# Patient Record
Sex: Female | Born: 1960 | Hispanic: No | Smoking: Current some day smoker
Health system: Southern US, Community
[De-identification: ages and names within clinical notes are randomized; demographics above are authoritative.]

## PROBLEM LIST (undated history)

## (undated) ENCOUNTER — Emergency Department (HOSPITAL_COMMUNITY): Admission: EM | Source: Home / Self Care

## (undated) DIAGNOSIS — K760 Fatty (change of) liver, not elsewhere classified: Secondary | ICD-10-CM

## (undated) DIAGNOSIS — T7840XA Allergy, unspecified, initial encounter: Secondary | ICD-10-CM

## (undated) DIAGNOSIS — E7800 Pure hypercholesterolemia, unspecified: Secondary | ICD-10-CM

## (undated) DIAGNOSIS — I2510 Atherosclerotic heart disease of native coronary artery without angina pectoris: Secondary | ICD-10-CM

## (undated) DIAGNOSIS — D649 Anemia, unspecified: Secondary | ICD-10-CM

## (undated) DIAGNOSIS — M2040 Other hammer toe(s) (acquired), unspecified foot: Secondary | ICD-10-CM

## (undated) DIAGNOSIS — D689 Coagulation defect, unspecified: Secondary | ICD-10-CM

## (undated) DIAGNOSIS — K8020 Calculus of gallbladder without cholecystitis without obstruction: Secondary | ICD-10-CM

## (undated) DIAGNOSIS — K219 Gastro-esophageal reflux disease without esophagitis: Secondary | ICD-10-CM

## (undated) DIAGNOSIS — F410 Panic disorder [episodic paroxysmal anxiety] without agoraphobia: Secondary | ICD-10-CM

## (undated) DIAGNOSIS — F32A Depression, unspecified: Secondary | ICD-10-CM

## (undated) DIAGNOSIS — Z972 Presence of dental prosthetic device (complete) (partial): Secondary | ICD-10-CM

## (undated) DIAGNOSIS — N281 Cyst of kidney, acquired: Secondary | ICD-10-CM

## (undated) DIAGNOSIS — Z5189 Encounter for other specified aftercare: Secondary | ICD-10-CM

## (undated) DIAGNOSIS — I10 Essential (primary) hypertension: Secondary | ICD-10-CM

## (undated) DIAGNOSIS — R0602 Shortness of breath: Secondary | ICD-10-CM

## (undated) DIAGNOSIS — J449 Chronic obstructive pulmonary disease, unspecified: Secondary | ICD-10-CM

## (undated) DIAGNOSIS — I252 Old myocardial infarction: Secondary | ICD-10-CM

## (undated) DIAGNOSIS — F319 Bipolar disorder, unspecified: Secondary | ICD-10-CM

## (undated) DIAGNOSIS — M810 Age-related osteoporosis without current pathological fracture: Secondary | ICD-10-CM

## (undated) DIAGNOSIS — H409 Unspecified glaucoma: Secondary | ICD-10-CM

## (undated) DIAGNOSIS — H269 Unspecified cataract: Secondary | ICD-10-CM

## (undated) DIAGNOSIS — F209 Schizophrenia, unspecified: Secondary | ICD-10-CM

## (undated) DIAGNOSIS — J45909 Unspecified asthma, uncomplicated: Secondary | ICD-10-CM

## (undated) DIAGNOSIS — I509 Heart failure, unspecified: Secondary | ICD-10-CM

## (undated) DIAGNOSIS — M1990 Unspecified osteoarthritis, unspecified site: Secondary | ICD-10-CM

## (undated) DIAGNOSIS — Z8489 Family history of other specified conditions: Secondary | ICD-10-CM

## (undated) DIAGNOSIS — I739 Peripheral vascular disease, unspecified: Secondary | ICD-10-CM

## (undated) DIAGNOSIS — I219 Acute myocardial infarction, unspecified: Secondary | ICD-10-CM

## (undated) DIAGNOSIS — G4730 Sleep apnea, unspecified: Secondary | ICD-10-CM

## (undated) DIAGNOSIS — R0600 Dyspnea, unspecified: Secondary | ICD-10-CM

## (undated) DIAGNOSIS — R011 Cardiac murmur, unspecified: Secondary | ICD-10-CM

## (undated) DIAGNOSIS — N393 Stress incontinence (female) (male): Secondary | ICD-10-CM

## (undated) HISTORY — PX: ORIF FOOT FRACTURE: SHX2123

## (undated) HISTORY — PX: UPPER GASTROINTESTINAL ENDOSCOPY: SHX188

## (undated) HISTORY — PX: OTHER SURGICAL HISTORY: SHX169

## (undated) HISTORY — DX: Coagulation defect, unspecified: D68.9

## (undated) HISTORY — PX: COLONOSCOPY: SHX174

## (undated) HISTORY — DX: Schizophrenia, unspecified: F20.9

## (undated) HISTORY — PX: CARDIAC CATHETERIZATION: SHX172

## (undated) HISTORY — DX: Fatty (change of) liver, not elsewhere classified: K76.0

## (undated) HISTORY — DX: Acute myocardial infarction, unspecified: I21.9

## (undated) HISTORY — PX: CLUB FOOT RELEASE: SHX1363

## (undated) HISTORY — DX: Unspecified asthma, uncomplicated: J45.909

## (undated) HISTORY — PX: HAND SURGERY: SHX662

## (undated) HISTORY — DX: Encounter for other specified aftercare: Z51.89

## (undated) HISTORY — DX: Allergy, unspecified, initial encounter: T78.40XA

## (undated) HISTORY — DX: Gastro-esophageal reflux disease without esophagitis: K21.9

## (undated) HISTORY — DX: Calculus of gallbladder without cholecystitis without obstruction: K80.20

## (undated) HISTORY — DX: Age-related osteoporosis without current pathological fracture: M81.0

---

## 2004-05-31 ENCOUNTER — Ambulatory Visit: Payer: Self-pay | Admitting: Internal Medicine

## 2004-07-04 ENCOUNTER — Ambulatory Visit: Payer: Self-pay | Admitting: Internal Medicine

## 2004-07-05 ENCOUNTER — Ambulatory Visit (HOSPITAL_COMMUNITY): Admission: RE | Admit: 2004-07-05 | Discharge: 2004-07-05 | Payer: Self-pay | Admitting: Internal Medicine

## 2004-08-01 ENCOUNTER — Ambulatory Visit: Payer: Self-pay | Admitting: Internal Medicine

## 2004-08-02 ENCOUNTER — Ambulatory Visit (HOSPITAL_COMMUNITY): Admission: RE | Admit: 2004-08-02 | Discharge: 2004-08-02 | Payer: Self-pay | Admitting: Internal Medicine

## 2005-07-28 ENCOUNTER — Inpatient Hospital Stay (HOSPITAL_COMMUNITY): Admission: AD | Admit: 2005-07-28 | Discharge: 2005-07-28 | Payer: Self-pay | Admitting: Obstetrics and Gynecology

## 2005-07-31 ENCOUNTER — Inpatient Hospital Stay (HOSPITAL_COMMUNITY): Admission: AD | Admit: 2005-07-31 | Discharge: 2005-07-31 | Payer: Self-pay | Admitting: Obstetrics & Gynecology

## 2006-05-28 ENCOUNTER — Emergency Department (HOSPITAL_COMMUNITY): Admission: EM | Admit: 2006-05-28 | Discharge: 2006-05-28 | Payer: Self-pay | Admitting: Emergency Medicine

## 2006-09-18 ENCOUNTER — Ambulatory Visit: Payer: Self-pay | Admitting: Internal Medicine

## 2006-09-19 ENCOUNTER — Ambulatory Visit: Payer: Self-pay | Admitting: *Deleted

## 2006-12-08 ENCOUNTER — Inpatient Hospital Stay (HOSPITAL_COMMUNITY): Admission: EM | Admit: 2006-12-08 | Discharge: 2006-12-13 | Payer: Self-pay | Admitting: Emergency Medicine

## 2006-12-17 ENCOUNTER — Inpatient Hospital Stay (HOSPITAL_COMMUNITY): Admission: EM | Admit: 2006-12-17 | Discharge: 2006-12-18 | Payer: Self-pay | Admitting: Emergency Medicine

## 2006-12-25 ENCOUNTER — Ambulatory Visit: Payer: Self-pay | Admitting: Internal Medicine

## 2007-01-01 ENCOUNTER — Ambulatory Visit: Payer: Self-pay | Admitting: Internal Medicine

## 2007-01-22 ENCOUNTER — Ambulatory Visit: Payer: Self-pay | Admitting: Internal Medicine

## 2007-02-14 ENCOUNTER — Encounter (HOSPITAL_COMMUNITY): Admission: RE | Admit: 2007-02-14 | Discharge: 2007-03-21 | Payer: Self-pay | Admitting: Cardiovascular Disease

## 2007-03-05 ENCOUNTER — Ambulatory Visit (HOSPITAL_COMMUNITY): Admission: RE | Admit: 2007-03-05 | Discharge: 2007-03-05 | Payer: Self-pay | Admitting: Cardiovascular Disease

## 2007-03-15 ENCOUNTER — Ambulatory Visit: Payer: Self-pay | Admitting: Family Medicine

## 2007-04-23 ENCOUNTER — Ambulatory Visit: Payer: Self-pay | Admitting: Internal Medicine

## 2007-06-26 ENCOUNTER — Emergency Department (HOSPITAL_COMMUNITY): Admission: EM | Admit: 2007-06-26 | Discharge: 2007-06-26 | Payer: Self-pay | Admitting: Emergency Medicine

## 2007-06-27 ENCOUNTER — Encounter: Payer: Self-pay | Admitting: Obstetrics & Gynecology

## 2007-06-27 ENCOUNTER — Ambulatory Visit: Payer: Self-pay | Admitting: Obstetrics & Gynecology

## 2007-06-27 ENCOUNTER — Other Ambulatory Visit: Admission: RE | Admit: 2007-06-27 | Discharge: 2007-06-27 | Payer: Self-pay | Admitting: Obstetrics & Gynecology

## 2007-06-27 ENCOUNTER — Ambulatory Visit (HOSPITAL_COMMUNITY): Admission: RE | Admit: 2007-06-27 | Discharge: 2007-06-27 | Payer: Self-pay | Admitting: Obstetrics & Gynecology

## 2007-07-04 ENCOUNTER — Ambulatory Visit (HOSPITAL_COMMUNITY): Admission: RE | Admit: 2007-07-04 | Discharge: 2007-07-04 | Payer: Self-pay | Admitting: Family Medicine

## 2007-07-09 ENCOUNTER — Ambulatory Visit: Payer: Self-pay | Admitting: Internal Medicine

## 2007-07-10 ENCOUNTER — Inpatient Hospital Stay (HOSPITAL_COMMUNITY): Admission: RE | Admit: 2007-07-10 | Discharge: 2007-07-21 | Payer: Self-pay | Admitting: Cardiovascular Disease

## 2007-07-11 ENCOUNTER — Encounter (INDEPENDENT_AMBULATORY_CARE_PROVIDER_SITE_OTHER): Payer: Self-pay | Admitting: Cardiovascular Disease

## 2007-07-12 ENCOUNTER — Ambulatory Visit: Payer: Self-pay | Admitting: Cardiothoracic Surgery

## 2007-07-12 ENCOUNTER — Encounter: Payer: Self-pay | Admitting: Cardiothoracic Surgery

## 2007-07-16 HISTORY — PX: FLEXIBLE BRONCHOSCOPY: SHX5094

## 2007-07-16 HISTORY — PX: CORONARY ARTERY BYPASS GRAFT: SHX141

## 2007-07-26 ENCOUNTER — Ambulatory Visit: Payer: Self-pay | Admitting: Internal Medicine

## 2007-08-07 ENCOUNTER — Ambulatory Visit: Payer: Self-pay | Admitting: *Deleted

## 2007-08-09 ENCOUNTER — Encounter: Admission: RE | Admit: 2007-08-09 | Discharge: 2007-08-09 | Payer: Self-pay | Admitting: Cardiothoracic Surgery

## 2007-08-09 ENCOUNTER — Ambulatory Visit: Payer: Self-pay | Admitting: Cardiothoracic Surgery

## 2007-08-23 ENCOUNTER — Encounter: Admission: RE | Admit: 2007-08-23 | Discharge: 2007-08-23 | Payer: Self-pay | Admitting: Cardiothoracic Surgery

## 2007-08-23 ENCOUNTER — Encounter (HOSPITAL_COMMUNITY): Admission: RE | Admit: 2007-08-23 | Discharge: 2007-11-21 | Payer: Self-pay | Admitting: Cardiovascular Disease

## 2007-08-23 ENCOUNTER — Ambulatory Visit: Payer: Self-pay | Admitting: Cardiothoracic Surgery

## 2007-08-26 ENCOUNTER — Ambulatory Visit: Payer: Self-pay | Admitting: Internal Medicine

## 2007-08-26 LAB — CONVERTED CEMR LAB
ALT: 13 units/L (ref 0–35)
AST: 12 units/L (ref 0–37)
Albumin: 3.9 g/dL (ref 3.5–5.2)
Alkaline Phosphatase: 119 units/L — ABNORMAL HIGH (ref 39–117)
BUN: 12 mg/dL (ref 6–23)
Basophils Absolute: 0.1 10*3/uL (ref 0.0–0.1)
Basophils Relative: 1 % (ref 0–1)
CO2: 21 meq/L (ref 19–32)
Calcium: 9.8 mg/dL (ref 8.4–10.5)
Chloride: 105 meq/L (ref 96–112)
Creatinine, Ser: 0.69 mg/dL (ref 0.40–1.20)
Eosinophils Absolute: 0.6 10*3/uL (ref 0.0–0.7)
Eosinophils Relative: 4 % (ref 0–5)
Glucose, Bld: 100 mg/dL — ABNORMAL HIGH (ref 70–99)
HCT: 40.5 % (ref 36.0–46.0)
Hemoglobin: 11.4 g/dL — ABNORMAL LOW (ref 12.0–15.0)
Lithium Lvl: 0.81 meq/L (ref 0.80–1.40)
Lymphocytes Relative: 20 % (ref 12–46)
Lymphs Abs: 2.7 10*3/uL (ref 0.7–4.0)
MCHC: 28.1 g/dL — ABNORMAL LOW (ref 30.0–36.0)
MCV: 97.1 fL (ref 78.0–100.0)
Monocytes Absolute: 1 10*3/uL (ref 0.1–1.0)
Monocytes Relative: 8 % (ref 3–12)
Neutro Abs: 9 10*3/uL — ABNORMAL HIGH (ref 1.7–7.7)
Neutrophils Relative %: 68 % (ref 43–77)
Platelets: 417 10*3/uL — ABNORMAL HIGH (ref 150–400)
Potassium: 4.3 meq/L (ref 3.5–5.3)
RBC: 4.17 M/uL (ref 3.87–5.11)
RDW: 23.8 % — ABNORMAL HIGH (ref 11.5–15.5)
Sodium: 139 meq/L (ref 135–145)
TSH: 3.682 microintl units/mL (ref 0.350–5.50)
Total Bilirubin: 0.2 mg/dL — ABNORMAL LOW (ref 0.3–1.2)
Total Protein: 7 g/dL (ref 6.0–8.3)
WBC: 13.4 10*3/uL — ABNORMAL HIGH (ref 4.0–10.5)

## 2007-08-28 ENCOUNTER — Ambulatory Visit: Payer: Self-pay | Admitting: Obstetrics and Gynecology

## 2007-09-04 ENCOUNTER — Ambulatory Visit: Payer: Self-pay | Admitting: Internal Medicine

## 2007-09-08 ENCOUNTER — Ambulatory Visit (HOSPITAL_COMMUNITY): Admission: RE | Admit: 2007-09-08 | Discharge: 2007-09-08 | Payer: Self-pay | Admitting: Internal Medicine

## 2007-09-19 ENCOUNTER — Ambulatory Visit: Payer: Self-pay | Admitting: Internal Medicine

## 2007-09-19 LAB — CONVERTED CEMR LAB
Free T4: 0.92 ng/dL (ref 0.89–1.80)
TSH: 2.176 microintl units/mL (ref 0.350–5.50)

## 2007-10-11 ENCOUNTER — Ambulatory Visit: Payer: Self-pay | Admitting: Obstetrics & Gynecology

## 2007-10-14 ENCOUNTER — Ambulatory Visit (HOSPITAL_COMMUNITY): Admission: RE | Admit: 2007-10-14 | Discharge: 2007-10-14 | Payer: Self-pay | Admitting: Internal Medicine

## 2007-11-01 ENCOUNTER — Ambulatory Visit: Payer: Self-pay | Admitting: Cardiothoracic Surgery

## 2007-11-22 ENCOUNTER — Ambulatory Visit: Payer: Self-pay | Admitting: Internal Medicine

## 2007-11-27 ENCOUNTER — Ambulatory Visit: Payer: Self-pay | Admitting: Obstetrics and Gynecology

## 2007-12-05 ENCOUNTER — Ambulatory Visit: Payer: Self-pay | Admitting: Internal Medicine

## 2007-12-19 ENCOUNTER — Ambulatory Visit (HOSPITAL_COMMUNITY): Admission: RE | Admit: 2007-12-19 | Discharge: 2007-12-19 | Payer: Self-pay | Admitting: Obstetrics and Gynecology

## 2007-12-19 ENCOUNTER — Ambulatory Visit: Payer: Self-pay | Admitting: Obstetrics and Gynecology

## 2007-12-19 HISTORY — PX: NOVASURE ABLATION: SHX5394

## 2008-01-03 ENCOUNTER — Ambulatory Visit: Payer: Self-pay | Admitting: Obstetrics & Gynecology

## 2008-01-30 ENCOUNTER — Observation Stay (HOSPITAL_COMMUNITY): Admission: RE | Admit: 2008-01-30 | Discharge: 2008-01-31 | Payer: Self-pay | Admitting: Orthopedic Surgery

## 2008-01-30 HISTORY — PX: KNEE ARTHROSCOPY WITH ANTERIOR CRUCIATE LIGAMENT (ACL) REPAIR: SHX5644

## 2008-05-19 ENCOUNTER — Encounter (INDEPENDENT_AMBULATORY_CARE_PROVIDER_SITE_OTHER): Payer: Self-pay | Admitting: Family Medicine

## 2008-05-19 ENCOUNTER — Ambulatory Visit: Payer: Self-pay | Admitting: Internal Medicine

## 2008-05-19 LAB — CONVERTED CEMR LAB
Basophils Absolute: 0.1 10*3/uL (ref 0.0–0.1)
Basophils Relative: 1 % (ref 0–1)
Cholesterol: 211 mg/dL — ABNORMAL HIGH (ref 0–200)
Eosinophils Absolute: 0.3 10*3/uL (ref 0.0–0.7)
Eosinophils Relative: 3 % (ref 0–5)
HCT: 47 % — ABNORMAL HIGH (ref 36.0–46.0)
HDL: 28 mg/dL — ABNORMAL LOW (ref >39)
Hemoglobin: 15.9 g/dL — ABNORMAL HIGH (ref 12.0–15.0)
Lithium Lvl: 0.54 meq/L — ABNORMAL LOW (ref 0.80–1.40)
Lymphocytes Relative: 31 % (ref 12–46)
Lymphs Abs: 3 10*3/uL (ref 0.7–4.0)
MCHC: 33.8 g/dL (ref 30.0–36.0)
MCV: 96.5 fL (ref 78.0–100.0)
Monocytes Absolute: 0.8 10*3/uL (ref 0.1–1.0)
Monocytes Relative: 8 % (ref 3–12)
Neutro Abs: 5.7 10*3/uL (ref 1.7–7.7)
Neutrophils Relative %: 58 % (ref 43–77)
Platelets: 319 10*3/uL (ref 150–400)
RBC: 4.87 M/uL (ref 3.87–5.11)
RDW: 14 % (ref 11.5–15.5)
TSH: 1.352 microintl units/mL (ref 0.350–4.50)
Total CHOL/HDL Ratio: 7.5
Triglycerides: 545 mg/dL — ABNORMAL HIGH (ref <150)
WBC: 9.9 10*3/uL (ref 4.0–10.5)

## 2008-06-24 ENCOUNTER — Ambulatory Visit: Payer: Self-pay | Admitting: Internal Medicine

## 2008-06-24 LAB — CONVERTED CEMR LAB
Cholesterol: 191 mg/dL (ref 0–200)
HDL: 25 mg/dL — ABNORMAL LOW (ref >39)
Total CHOL/HDL Ratio: 7.6
Triglycerides: 779 mg/dL — ABNORMAL HIGH (ref <150)

## 2008-07-28 ENCOUNTER — Emergency Department (HOSPITAL_COMMUNITY): Admission: EM | Admit: 2008-07-28 | Discharge: 2008-07-28 | Payer: Self-pay | Admitting: Emergency Medicine

## 2009-01-07 ENCOUNTER — Ambulatory Visit: Payer: Self-pay | Admitting: Internal Medicine

## 2009-01-21 ENCOUNTER — Ambulatory Visit (HOSPITAL_COMMUNITY): Admission: RE | Admit: 2009-01-21 | Discharge: 2009-01-21 | Payer: Self-pay | Admitting: Internal Medicine

## 2009-03-29 IMAGING — CT CT ANGIO CHEST
3 of 4 series · 18 of 30 positions shown · IV contrast (omnipaque)
Comparison: none

CLINICAL DATA: Chest pain and dyspnea.
CT ANGIOGRAPHY OF CHEST:
TECHNIQUE: Multidetector CT imaging of the chest was performed during bolus injection of intravenous contrast.  Multiplanar CT angiographic image reconstructions were generated to evaluate the vascular anatomy.
Contrast: 100 cc intravenous Omnipaque 300.

[Series 2: pe · axial · 0.70mm/px · z∈[-309,-92]mm · 9 of 223 slices shown]
[im 25/223  lung]
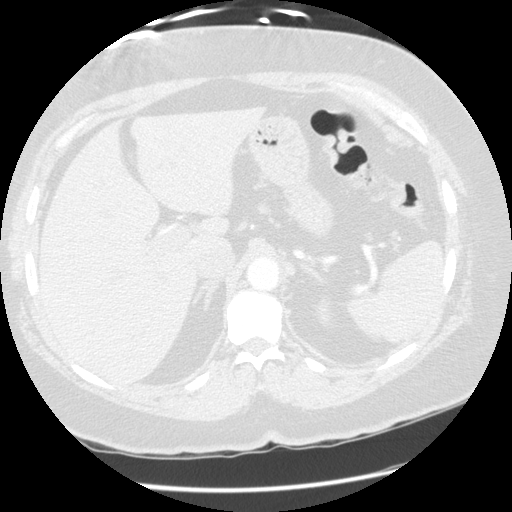
[im 50/223  mediastinal]
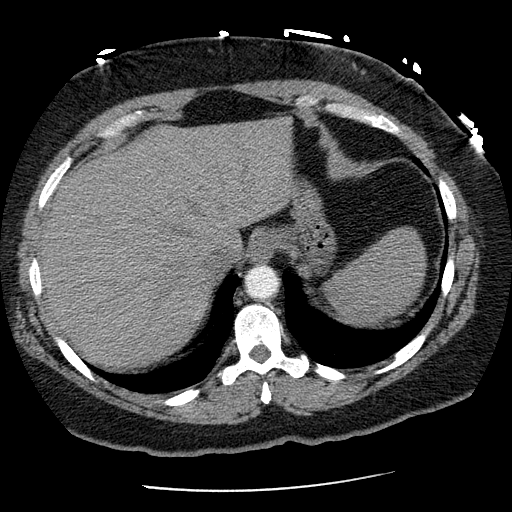
[im 75/223  lung]
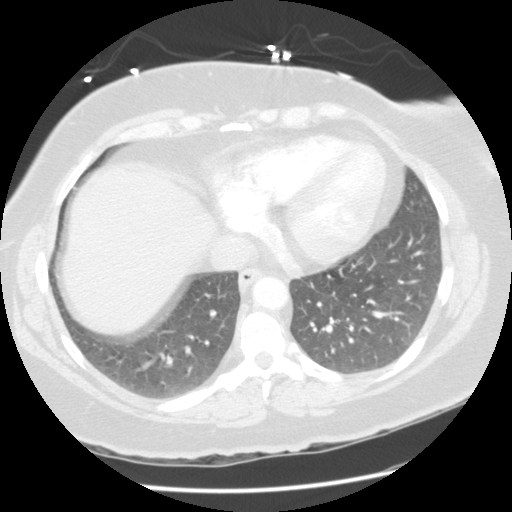
[im 99/223  mediastinal]
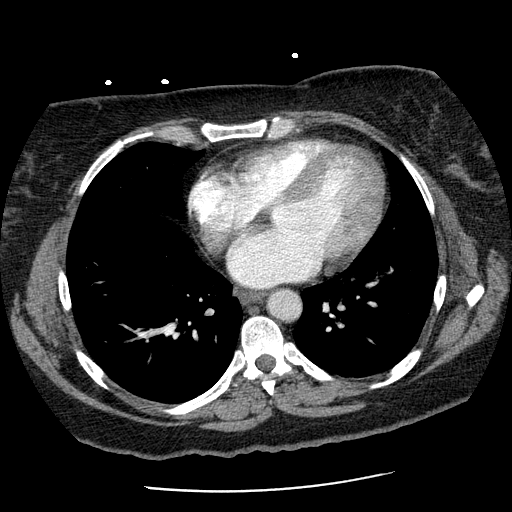
[im 121/223  lung]
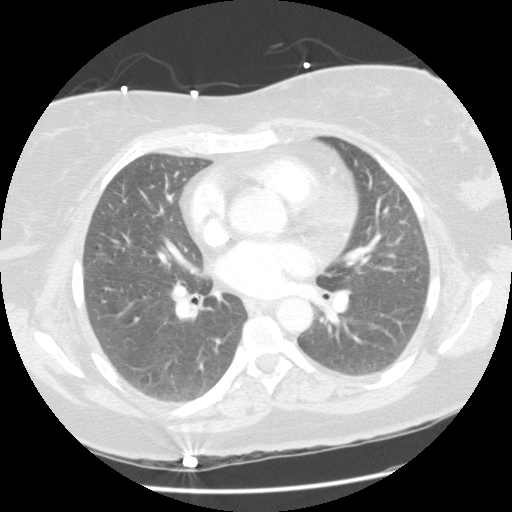
[im 124/223  mediastinal]
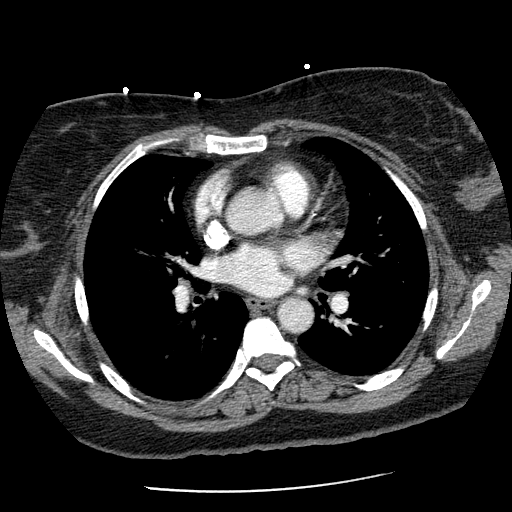
[im 149/223  lung]
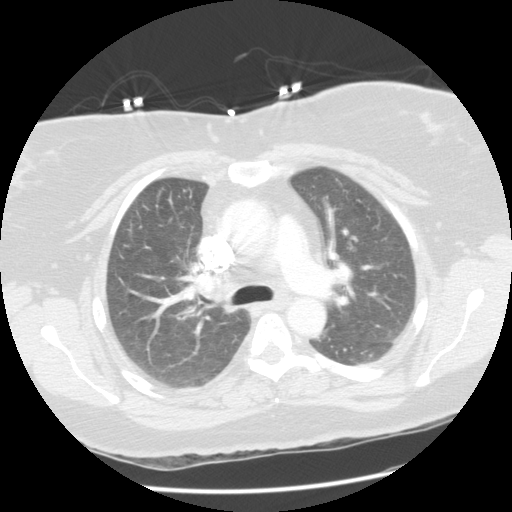
[im 173/223  mediastinal]
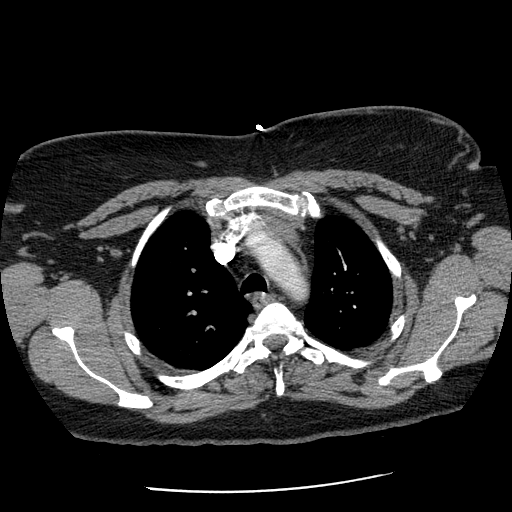
[im 198/223  lung]
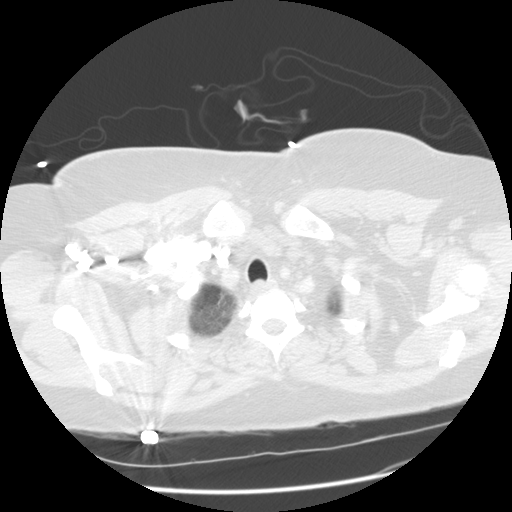

[Series 3: recon 2: pe · axial · 0.70mm/px · z∈[-271,-134]mm · 4 of 111 slices shown]
[im 28/111  lung]
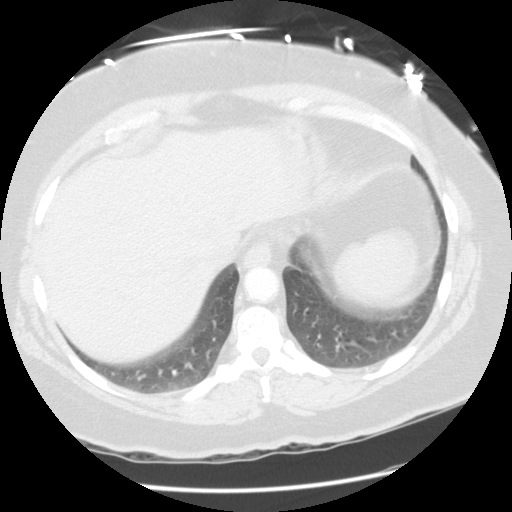
[im 56/111  lung]
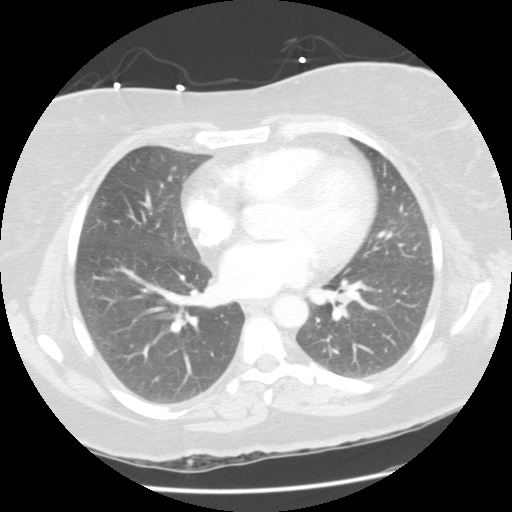
[im 61/111  lung]
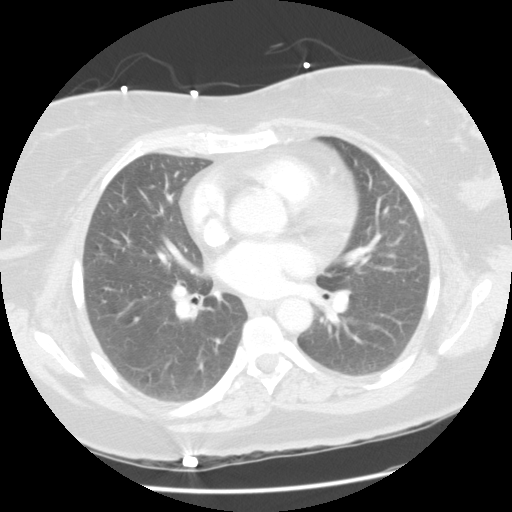
[im 83/111  lung]
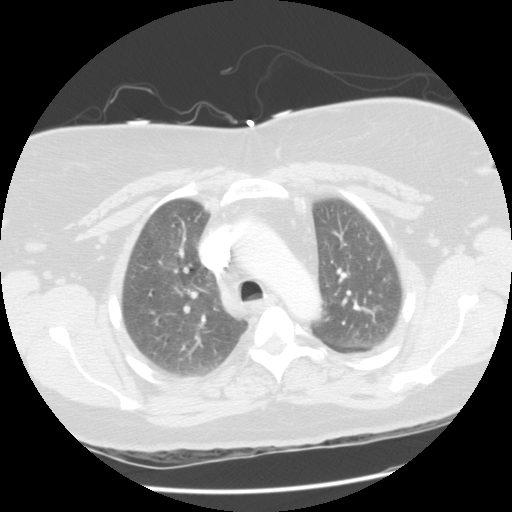

[Series 201: reformatted · sagittal · 0.70mm/px · 5 of 168 slices shown]
[im 28/168  lung]
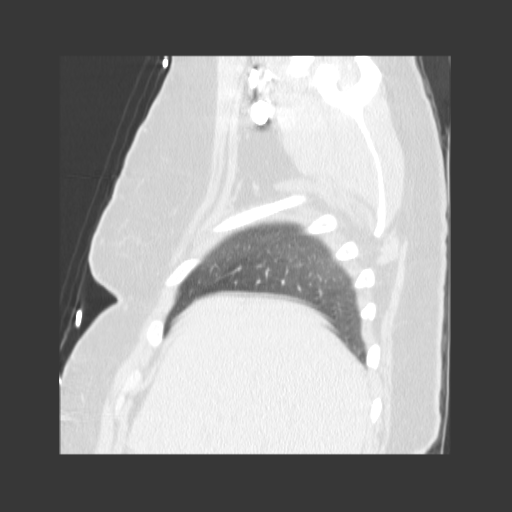
[im 56/168  lung]
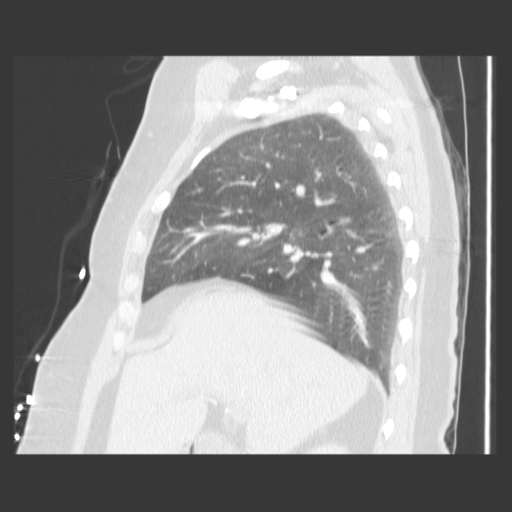
[im 84/168  lung]
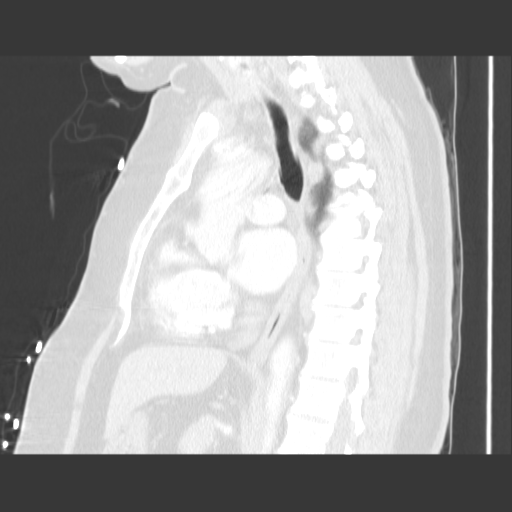
[im 112/168  lung]
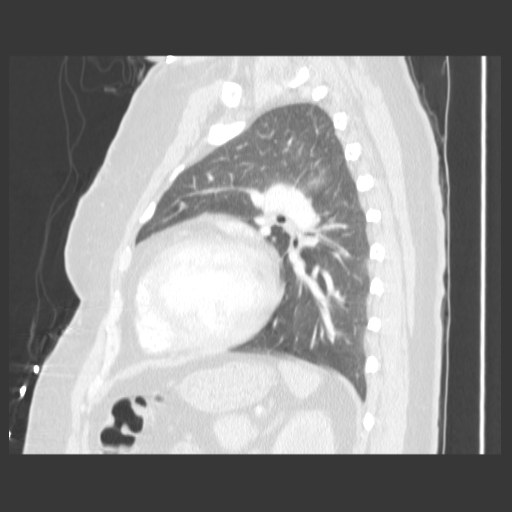
[im 140/168  lung]
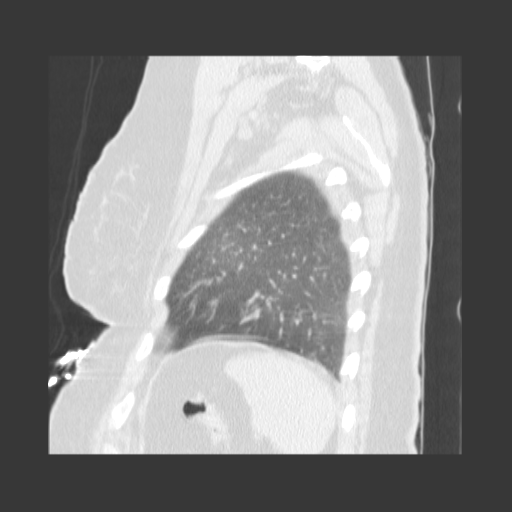

[18 of 30 positions shown; findings below may reference images not displayed]

FINDINGS: There is no evidence of filling defects in the pulmonary arterial system to suggest pulmonary emboli.  Cardiomegaly is identified and there is no evidence of thoracic aortic aneurysm.  There is focal dilatation of the left main pulmonary artery which can be seen with pulmonary valvular stenosis and consider further evaluation as indicated.  No pleural or pericardial effusions or enlarged lymph nodes are identified.  There is no evidence of focal consolidation, masses, or airspace disease.  Mild peribronchial thickening is present.
IMPRESSION: 1.  No acute abnormalities.
2.  Focal enlargement of the left main pulmonary artery ? can be seen with pulmonary valvular stenosis.  Consider echocardiogram as indicated.
3.  Mild cardiomegaly.

## 2009-05-05 ENCOUNTER — Ambulatory Visit: Payer: Self-pay | Admitting: Obstetrics and Gynecology

## 2009-05-05 ENCOUNTER — Encounter: Payer: Self-pay | Admitting: Advanced Practice Midwife

## 2009-05-05 ENCOUNTER — Ambulatory Visit: Payer: Self-pay | Admitting: Internal Medicine

## 2009-05-12 ENCOUNTER — Ambulatory Visit (HOSPITAL_COMMUNITY): Admission: RE | Admit: 2009-05-12 | Discharge: 2009-05-12 | Payer: Self-pay | Admitting: Internal Medicine

## 2009-06-09 ENCOUNTER — Ambulatory Visit: Payer: Self-pay | Admitting: Obstetrics and Gynecology

## 2009-06-10 ENCOUNTER — Encounter: Payer: Self-pay | Admitting: Family

## 2009-06-10 LAB — CONVERTED CEMR LAB
Estradiol: 78.9 pg/mL
FSH: 11.2 milliintl units/mL
HCT: 44.6 % (ref 36.0–46.0)
Hemoglobin: 14.8 g/dL (ref 12.0–15.0)
LH: 4.9 milliintl units/mL
MCHC: 33.2 g/dL (ref 30.0–36.0)
MCV: 100.9 fL — ABNORMAL HIGH (ref 78.0–100.0)
Platelets: 328 10*3/uL (ref 150–400)
RBC: 4.42 M/uL (ref 3.87–5.11)
RDW: 13.7 % (ref 11.5–15.5)
TSH: 2.499 microintl units/mL (ref 0.350–4.500)
WBC: 13.2 10*3/uL — ABNORMAL HIGH (ref 4.0–10.5)

## 2009-06-23 ENCOUNTER — Ambulatory Visit: Payer: Self-pay | Admitting: Obstetrics and Gynecology

## 2009-06-30 ENCOUNTER — Ambulatory Visit: Payer: Self-pay | Admitting: Obstetrics and Gynecology

## 2009-07-06 ENCOUNTER — Ambulatory Visit: Payer: Self-pay | Admitting: Internal Medicine

## 2009-07-16 ENCOUNTER — Inpatient Hospital Stay (HOSPITAL_COMMUNITY): Admission: EM | Admit: 2009-07-16 | Discharge: 2009-07-19 | Payer: Self-pay | Admitting: Emergency Medicine

## 2010-02-10 ENCOUNTER — Ambulatory Visit: Payer: Self-pay | Admitting: Obstetrics and Gynecology

## 2010-02-28 ENCOUNTER — Encounter: Admission: RE | Admit: 2010-02-28 | Discharge: 2010-02-28 | Payer: Self-pay | Admitting: Cardiovascular Disease

## 2010-03-04 ENCOUNTER — Ambulatory Visit (HOSPITAL_COMMUNITY): Admission: RE | Admit: 2010-03-04 | Discharge: 2010-03-04 | Payer: Self-pay | Admitting: Cardiovascular Disease

## 2010-03-04 HISTORY — PX: CARDIAC CATHETERIZATION: SHX172

## 2010-04-28 ENCOUNTER — Ambulatory Visit: Payer: Self-pay | Admitting: Obstetrics and Gynecology

## 2010-09-11 ENCOUNTER — Encounter: Payer: Self-pay | Admitting: *Deleted

## 2010-11-05 LAB — GLUCOSE, CAPILLARY
Glucose-Capillary: 110 mg/dL — ABNORMAL HIGH (ref 70–99)
Glucose-Capillary: 115 mg/dL — ABNORMAL HIGH (ref 70–99)

## 2010-11-23 LAB — CBC
HCT: 42.3 % (ref 36.0–46.0)
HCT: 42.6 % (ref 36.0–46.0)
HCT: 43.5 % (ref 36.0–46.0)
HCT: 43.9 % (ref 36.0–46.0)
HCT: 44.5 % (ref 36.0–46.0)
Hemoglobin: 14.5 g/dL (ref 12.0–15.0)
Hemoglobin: 14.6 g/dL (ref 12.0–15.0)
Hemoglobin: 14.9 g/dL (ref 12.0–15.0)
Hemoglobin: 15 g/dL (ref 12.0–15.0)
Hemoglobin: 15 g/dL (ref 12.0–15.0)
MCHC: 33.8 g/dL (ref 30.0–36.0)
MCHC: 34.2 g/dL (ref 30.0–36.0)
MCHC: 34.2 g/dL (ref 30.0–36.0)
MCHC: 34.3 g/dL (ref 30.0–36.0)
MCHC: 34.4 g/dL (ref 30.0–36.0)
MCV: 96.9 fL (ref 78.0–100.0)
MCV: 97.2 fL (ref 78.0–100.0)
MCV: 97.5 fL (ref 78.0–100.0)
MCV: 97.8 fL (ref 78.0–100.0)
MCV: 97.8 fL (ref 78.0–100.0)
Platelets: 239 10*3/uL (ref 150–400)
Platelets: 240 10*3/uL (ref 150–400)
Platelets: 268 10*3/uL (ref 150–400)
Platelets: 271 10*3/uL (ref 150–400)
Platelets: 274 10*3/uL (ref 150–400)
RBC: 4.36 MIL/uL (ref 3.87–5.11)
RBC: 4.4 MIL/uL (ref 3.87–5.11)
RBC: 4.45 MIL/uL (ref 3.87–5.11)
RBC: 4.49 MIL/uL (ref 3.87–5.11)
RBC: 4.56 MIL/uL (ref 3.87–5.11)
RDW: 13.6 % (ref 11.5–15.5)
RDW: 13.7 % (ref 11.5–15.5)
RDW: 13.9 % (ref 11.5–15.5)
RDW: 14 % (ref 11.5–15.5)
RDW: 14.3 % (ref 11.5–15.5)
WBC: 10.8 10*3/uL — ABNORMAL HIGH (ref 4.0–10.5)
WBC: 11.3 10*3/uL — ABNORMAL HIGH (ref 4.0–10.5)
WBC: 11.5 10*3/uL — ABNORMAL HIGH (ref 4.0–10.5)
WBC: 11.9 10*3/uL — ABNORMAL HIGH (ref 4.0–10.5)
WBC: 9.9 10*3/uL (ref 4.0–10.5)

## 2010-11-23 LAB — COMPREHENSIVE METABOLIC PANEL
ALT: 26 U/L (ref 0–35)
AST: 31 U/L (ref 0–37)
Albumin: 3.9 g/dL (ref 3.5–5.2)
Alkaline Phosphatase: 85 U/L (ref 39–117)
BUN: 9 mg/dL (ref 6–23)
CO2: 21 mEq/L (ref 19–32)
Calcium: 8.8 mg/dL (ref 8.4–10.5)
Chloride: 102 mEq/L (ref 96–112)
Creatinine, Ser: 0.67 mg/dL (ref 0.4–1.2)
GFR calc Af Amer: >60 mL/min (ref >60)
GFR calc non Af Amer: >60 mL/min (ref >60)
Glucose, Bld: 121 mg/dL — ABNORMAL HIGH (ref 70–99)
Potassium: 3.6 mEq/L (ref 3.5–5.1)
Sodium: 134 mEq/L — ABNORMAL LOW (ref 135–145)
Total Bilirubin: 0.6 mg/dL (ref 0.3–1.2)
Total Protein: 6.9 g/dL (ref 6.0–8.3)

## 2010-11-23 LAB — BASIC METABOLIC PANEL
BUN: 10 mg/dL (ref 6–23)
BUN: 12 mg/dL (ref 6–23)
BUN: 9 mg/dL (ref 6–23)
CO2: 24 mEq/L (ref 19–32)
CO2: 26 mEq/L (ref 19–32)
CO2: 26 mEq/L (ref 19–32)
Calcium: 8.9 mg/dL (ref 8.4–10.5)
Calcium: 9.6 mg/dL (ref 8.4–10.5)
Calcium: 9.7 mg/dL (ref 8.4–10.5)
Chloride: 106 mEq/L (ref 96–112)
Chloride: 108 mEq/L (ref 96–112)
Chloride: 109 mEq/L (ref 96–112)
Creatinine, Ser: 0.79 mg/dL (ref 0.4–1.2)
Creatinine, Ser: 0.79 mg/dL (ref 0.4–1.2)
Creatinine, Ser: 0.82 mg/dL (ref 0.4–1.2)
GFR calc Af Amer: >60 mL/min (ref >60)
GFR calc Af Amer: >60 mL/min (ref >60)
GFR calc Af Amer: >60 mL/min (ref >60)
GFR calc non Af Amer: >60 mL/min (ref >60)
GFR calc non Af Amer: >60 mL/min (ref >60)
GFR calc non Af Amer: >60 mL/min (ref >60)
Glucose, Bld: 101 mg/dL — ABNORMAL HIGH (ref 70–99)
Glucose, Bld: 111 mg/dL — ABNORMAL HIGH (ref 70–99)
Glucose, Bld: 111 mg/dL — ABNORMAL HIGH (ref 70–99)
Potassium: 3.9 mEq/L (ref 3.5–5.1)
Potassium: 4 mEq/L (ref 3.5–5.1)
Potassium: 4.1 mEq/L (ref 3.5–5.1)
Sodium: 138 mEq/L (ref 135–145)
Sodium: 140 mEq/L (ref 135–145)
Sodium: 140 mEq/L (ref 135–145)

## 2010-11-23 LAB — PROTIME-INR
INR: 1.14 (ref 0.00–1.49)
Prothrombin Time: 14.5 seconds (ref 11.6–15.2)

## 2010-11-23 LAB — GLUCOSE, CAPILLARY
Glucose-Capillary: 104 mg/dL — ABNORMAL HIGH (ref 70–99)
Glucose-Capillary: 109 mg/dL — ABNORMAL HIGH (ref 70–99)
Glucose-Capillary: 110 mg/dL — ABNORMAL HIGH (ref 70–99)
Glucose-Capillary: 111 mg/dL — ABNORMAL HIGH (ref 70–99)
Glucose-Capillary: 112 mg/dL — ABNORMAL HIGH (ref 70–99)
Glucose-Capillary: 116 mg/dL — ABNORMAL HIGH (ref 70–99)
Glucose-Capillary: 118 mg/dL — ABNORMAL HIGH (ref 70–99)
Glucose-Capillary: 120 mg/dL — ABNORMAL HIGH (ref 70–99)
Glucose-Capillary: 128 mg/dL — ABNORMAL HIGH (ref 70–99)
Glucose-Capillary: 128 mg/dL — ABNORMAL HIGH (ref 70–99)
Glucose-Capillary: 87 mg/dL (ref 70–99)

## 2010-11-23 LAB — BRAIN NATRIURETIC PEPTIDE
Pro B Natriuretic peptide (BNP): 46 pg/mL (ref 0.0–100.0)
Pro B Natriuretic peptide (BNP): 62 pg/mL (ref 0.0–100.0)

## 2010-11-23 LAB — CARDIAC PANEL(CRET KIN+CKTOT+MB+TROPI)
CK, MB: 15 ng/mL — ABNORMAL HIGH (ref 0.3–4.0)
CK, MB: 17.8 ng/mL — ABNORMAL HIGH (ref 0.3–4.0)
CK, MB: 20.3 ng/mL — ABNORMAL HIGH (ref 0.3–4.0)
Relative Index: 10.1 — ABNORMAL HIGH (ref 0.0–2.5)
Relative Index: 7.3 — ABNORMAL HIGH (ref 0.0–2.5)
Relative Index: 9.1 — ABNORMAL HIGH (ref 0.0–2.5)
Total CK: 177 U/L (ref 7–177)
Total CK: 205 U/L — ABNORMAL HIGH (ref 7–177)
Total CK: 224 U/L — ABNORMAL HIGH (ref 7–177)
Troponin I: 1.04 ng/mL (ref 0.00–0.06)
Troponin I: 2.4 ng/mL (ref 0.00–0.06)
Troponin I: 2.64 ng/mL (ref 0.00–0.06)

## 2010-11-23 LAB — LIPID PANEL
Cholesterol: 193 mg/dL (ref 0–200)
HDL: 34 mg/dL — ABNORMAL LOW (ref >39)
LDL Cholesterol: 95 mg/dL (ref 0–99)
Total CHOL/HDL Ratio: 5.7 RATIO
Triglycerides: 318 mg/dL — ABNORMAL HIGH (ref <150)
VLDL: 64 mg/dL — ABNORMAL HIGH (ref 0–40)

## 2010-11-23 LAB — DIFFERENTIAL
Basophils Absolute: 0.1 10*3/uL (ref 0.0–0.1)
Basophils Relative: 1 % (ref 0–1)
Eosinophils Absolute: 0.2 10*3/uL (ref 0.0–0.7)
Eosinophils Relative: 2 % (ref 0–5)
Lymphocytes Relative: 30 % (ref 12–46)
Lymphs Abs: 3.4 10*3/uL (ref 0.7–4.0)
Monocytes Absolute: 0.9 10*3/uL (ref 0.1–1.0)
Monocytes Relative: 8 % (ref 3–12)
Neutro Abs: 7 10*3/uL (ref 1.7–7.7)
Neutrophils Relative %: 61 % (ref 43–77)

## 2010-11-23 LAB — CK TOTAL AND CKMB (NOT AT ARMC)
CK, MB: 1.7 ng/mL (ref 0.3–4.0)
CK, MB: 2.6 ng/mL (ref 0.3–4.0)
CK, MB: 4.6 ng/mL — ABNORMAL HIGH (ref 0.3–4.0)
CK, MB: 7.8 ng/mL — ABNORMAL HIGH (ref 0.3–4.0)
Relative Index: 3.4 — ABNORMAL HIGH (ref 0.0–2.5)
Relative Index: 5.5 — ABNORMAL HIGH (ref 0.0–2.5)
Relative Index: INVALID (ref 0.0–2.5)
Relative Index: INVALID (ref 0.0–2.5)
Total CK: 135 U/L (ref 7–177)
Total CK: 142 U/L (ref 7–177)
Total CK: 91 U/L (ref 7–177)
Total CK: 93 U/L (ref 7–177)

## 2010-11-23 LAB — APTT
aPTT: 64 seconds — ABNORMAL HIGH (ref 24–37)
aPTT: >200 seconds (ref 24–37)

## 2010-11-23 LAB — TROPONIN I
Troponin I: 0.03 ng/mL (ref 0.00–0.06)
Troponin I: 0.07 ng/mL — ABNORMAL HIGH (ref 0.00–0.06)
Troponin I: 1.56 ng/mL (ref 0.00–0.06)
Troponin I: 1.96 ng/mL (ref 0.00–0.06)

## 2010-11-23 LAB — HEPARIN LEVEL (UNFRACTIONATED)
Heparin Unfractionated: 0.19 IU/mL — ABNORMAL LOW (ref 0.30–0.70)
Heparin Unfractionated: 0.35 IU/mL (ref 0.30–0.70)
Heparin Unfractionated: 0.36 IU/mL (ref 0.30–0.70)
Heparin Unfractionated: 0.46 IU/mL (ref 0.30–0.70)
Heparin Unfractionated: 0.59 IU/mL (ref 0.30–0.70)

## 2010-11-23 LAB — TSH: TSH: 1.792 u[IU]/mL (ref 0.350–4.500)

## 2010-11-23 LAB — HEMOGLOBIN A1C
Hgb A1c MFr Bld: 6 % (ref 4.6–6.1)
Mean Plasma Glucose: 126 mg/dL

## 2010-11-23 LAB — MAGNESIUM: Magnesium: 2.3 mg/dL (ref 1.5–2.5)

## 2010-11-29 ENCOUNTER — Emergency Department (HOSPITAL_COMMUNITY)
Admission: EM | Admit: 2010-11-29 | Discharge: 2010-11-29 | Disposition: A | Discharge status: Home / Self Care | Payer: Medicare Other | Attending: Emergency Medicine | Admitting: Emergency Medicine

## 2010-11-29 DIAGNOSIS — I209 Angina pectoris, unspecified: Secondary | ICD-10-CM | POA: Insufficient documentation

## 2010-11-29 DIAGNOSIS — F319 Bipolar disorder, unspecified: Secondary | ICD-10-CM | POA: Insufficient documentation

## 2010-11-29 DIAGNOSIS — M542 Cervicalgia: Secondary | ICD-10-CM | POA: Insufficient documentation

## 2010-11-29 DIAGNOSIS — E785 Hyperlipidemia, unspecified: Secondary | ICD-10-CM | POA: Insufficient documentation

## 2010-11-29 DIAGNOSIS — I252 Old myocardial infarction: Secondary | ICD-10-CM | POA: Insufficient documentation

## 2010-11-29 DIAGNOSIS — R209 Unspecified disturbances of skin sensation: Secondary | ICD-10-CM | POA: Insufficient documentation

## 2010-11-29 DIAGNOSIS — I10 Essential (primary) hypertension: Secondary | ICD-10-CM | POA: Insufficient documentation

## 2010-11-29 DIAGNOSIS — R079 Chest pain, unspecified: Secondary | ICD-10-CM | POA: Insufficient documentation

## 2010-11-29 DIAGNOSIS — E119 Type 2 diabetes mellitus without complications: Secondary | ICD-10-CM | POA: Insufficient documentation

## 2011-01-03 NOTE — Group Therapy Note (Signed)
NAME:  [name], [name] NO.:  [id number]   MEDICAL RECORD NO.:  [id number]          PATIENT TYPE:  WOC   LOCATION:  [facility]                   FACILITY:  [facility]   PHYSICIAN:  Argentina Donovan, MD        DATE OF BIRTH:  1961/03/19   DATE OF SERVICE:                                  CLINIC NOTE   Patient is a 50 year old Caucasian female whose is bothered by constant  menorrhagia.  She has a Mirena IUD in order to control it.  Her health  has been compromised by coronary artery bypass grafting x3, heart  catheterization x2.  She has a history of a C-section, bilateral tubal  ligation.   PAST MEDICAL HISTORY:  1. Colitis.  2. Coronary artery disease.  3. Depression.  4. Dyslipidemia.  5. Hypertension.  6. Diabetes.   We have talked to her about alternatives and decided on NovaSure  endometrial ablation.  I have given her the information on that.  It  probably can be done with pre-sedation with local.  We have gotten a  clearance from her cardiologist.  York Spaniel that she could hold her aspirin  and Plavix the day of surgery.  I do not think that it is necessary for  her to do that, and I have told her that I see no reason she cannot  continue taking that as bleeding should not be a problem with the  endometrial ablation.   IMPRESSION:  Intractable metrorrhagia.           ______________________________  Argentina Donovan, MD     PR/MEDQ  D:  11/27/2007  T:  11/27/2007  Job:  [id number]

## 2011-01-03 NOTE — Consult Note (Signed)
NAMEPHYNIX, HORTON             ACCOUNT NO.:  [id number]   MEDICAL RECORD NO.:  [id number]          PATIENT TYPE:  INP   LOCATION:  2002                         FACILITY:  [facility]   PHYSICIAN:  Kerin Perna, M.D.  DATE OF BIRTH:  05-06-1961   DATE OF CONSULTATION:  07/11/2007  DATE OF DISCHARGE:                                 CONSULTATION   PHYSICIAN REQUESTING CONSULTATION:  Nanetta Batty, M.D.   PRIMARY CARE PHYSICIAN:  [facility].   CONSULTATIONS:  Severe, recurrent coronary artery disease, status post  percutaneous coronary intervention, April 2008.   HISTORY OF PRESENT ILLNESS:  I was asked to evaluate this 50 year old  diabetic smoker for potential surgical coronary revascularization for  recently diagnosed severe multivessel coronary artery disease.  The  patient presented in April of this year with an inferior MI and  underwent a drug-eluting stent placement and was placed on Plavix.  Shortly after the stent placement, the patient developed bradycardia and  had a pacemaker placed.  The patient recently has had exertional chest  pain and resting chest pain, as well as dyspnea on exertion, somewhat  improved with nitroglycerin.  She presented to the emergency department  recently, where her enzymes were negative and underwent a CT scan that  ruled out pulmonary embolus.  The patient was admitted and placed on  heparin and Integrilin and a cardiac cath today by Dr. Allyson Sabal shows  restenosis of the stent at the posterior descending branch of the right  coronary as well as a proximal 80% stenosis of the LAD, which appears to  involve the diagonal as well.  Her EF is normal and LVEDP is 22.  Based  on her coronary anatomy and symptoms, she is felt to be a candidate now  for surgical coronary revascularization.   PAST MEDICAL HISTORY:  1. Manic-depressive disorder, followed by Dr. Allyne Gee and Psychiatry.  2. Active smoking with COPD.  3. Hypertension.  4. Borderline  diabetes.  5. Right knee instability, status post surgery.  6. History of drug abuse.  7. Chronic Plavix therapy.  8. Allergy to Haldol.   CURRENT MEDICATIONS:  1. Plavix 75 mg daily.  2. Lopressor 25 mg b.i.d.  3. Lithium 900 mg nightly.  4. Lipitor 20 mg daily.  5. Aspirin 81 mg daily.  6. Lisinopril 5 mg daily.  7. Trazodone 200 mg nightly.  8. Alphagan eye drops in each eye b.i.d.  9. Zoloft 100 mg a day.  10.Nitroglycerin p.r.n.  11.She is currently also on Integrilin IV after stopping the Plavix.   SOCIAL HISTORY:  The patient smokes 1-2 packs cigarettes a day.  She is  divorced.  She has used cocaine regularly; this has been in the past.  She does not use alcohol and she is unemployed.   FAMILY HISTORY:  Positive for coronary disease and MI and an early age  in her father.  Positive family history for diabetes.   REVIEW OF SYSTEMS:  The patient denies any fever or weight loss.  She  has a chronic cough which is intermittently productive.  She has been  having some nose  bleeds while in the hospital on Integrilin.  She denies  difficulty swallowing or active dental problems.  She does have some  wheezing and bronchitis, but has not been on antibiotics recently.  She  has had left facial pain and numbness intermittently.  She recently  underwent a Pap smear and endometrial biopsy which was negative for  cancer.  She has had surgery in the right knee with instability and  states she needs a total knee replacement in the right side.  Her CT  scan of the chest showed no pulmonary emboli, but the left pulmonary  artery was mildly dilated.   PHYSICAL EXAMINATION:  VITAL SIGNS:  She is 5 feet 5 inches and weighs  225 pounds. Blood pressure 130/70, pulse 70 and sinus.  GENERAL APPEARANCE:  A middle-aged, obese white female in no acute  distress.  HEENT:  Normocephalic.  NECK:  Without JVD, mass or bruit.  LYMPHATICS:  No palpable supraclavicular or cervical adenopathy.   CHEST:  The breath sounds are with scattered rhonchi.  CARDIAC:  Regular rhythm without S3, gallop or murmur.  ABDOMEN:  Obese, soft and nontender.  EXTREMITIES:  Reveal no edema.  There is no cyanosis or clubbing of the  extremities.  Peripheral pulses are 2+.  NEUROLOGIC:  Exam is nonfocal.   IMPRESSION AND PLAN:  The patient has significant multivessel coronary  artery disease with preserved left ventricular function and significant  comorbid medical problems including bipolar disorder, chronic  obstructive pulmonary disease with active smoking, borderline diabetes,  obesity, and hypertension.   She would benefit from multivessel coronary bypass surgery after a  Plavix washout; this will be scheduled for the first part of the  following week; depending on when the Plavix was last administered, the  date of surgery will be Monday or Tuesday.  Prior to surgery, we will  start her on a pulmonary tune-up and check a 2-D echocardiogram.  Her  pre coronary artery bypass graft Dopplers indicate a right internal  carotid artery 60% to 80% moderate stenosis.  Ankle-brachial indices are  1 and the left palmar arch study is abnormal.      Kerin Perna, M.D.  Electronically Signed     PV/MEDQ  D:  07/11/2007  T:  07/12/2007  Job:  [id number]   cc:   [facility] Office  Amy Allyson Sabal, PA

## 2011-01-03 NOTE — Assessment & Plan Note (Signed)
OFFICE VISIT   [name], [name]  DOB:  October 10, 1960                                        August 09, 2007  CHART #:  [id number]   The patient is status post coronary artery bypass grafting x3 done  07/16/07 by Dr. Donata Clay.  Postoperatively the patient did have a mild  ileus as well as acute blood loss anemia and some volume overload. This  all stabilized and she was discharged to home.  She presents today for  follow-up appointment.  Does state that she still has some sternal  incisional pain.  Was discharged home on oxycodone but this has made her  sick so currently is only taking Tylenol with mild relief.  She also  complains of pain left elbow radiating up to her shoulder which is  constant.  Relieved when she lifts her arms.  The patient is ambulating  without difficulty.  She denies any significant shortness of breath but  does state that she has been coughing up yellow clearish mucous  recently.  She is using her Advair inhaler at home.  The patient denies  any opening drainage from any of her incision sites.  She has seen Dr.  Allyson Sabal postoperatively.  Dr. Allyson Sabal prescribed an iron pill for the  patient's anemia.  The patient states this is too expensive and found an  over-the-counter iron pill which she is asking if she can take.  The  patient has set up for cardiac rehab 08/26/2007.   PHYSICAL EXAMINATION:  VITALS:  Blood pressure 145/95, pulse of 84,  respirations of 18, O2 sats are 98%.  LUNGS:  Bilateral rhonchi nor wheezing noted.  Diminished breath sounds  left base.  CARDIAC:  Regular rate and rhythm.  ABDOMEN:  Bowel sounds x4.  Soft, nontender.  EXTREMITIES:  Warm to touch.  No edema noted.  INCISIONS:  All incisions are clean, dry and intact and healing well.   STUDIES:  The patient had chest x-ray done today 08/09/2007 showing a  mild left pleural effusion.   IMPRESSION AND PLAN:  The patient is status post coronary artery  bypass  grafting.  The patient is progressing well.  She was seen and evaluated  by Dr. Donata Clay.  On evaluation today the patient's blood pressure is  noted to be elevated with systolic in the 140s.  Heart rate stable.  Will start the patient on Lopressor 25 mg daily.  Due to the patient's  left pleural effusion, will place her on a week's dose of Lasix as well  as potassium.  Discussed with the patient anemia and is okay to take the  over the counter iron 65 mg 2 tablets daily.  The patient also  instructed to continue all other medications and was given a  prescription for Darvocet N100 50 tabs q. 4-6 hours, a total of 40.  Due  to patient's productive cough, the patient was instructed to pick up  some Mucinex over-the-counter and take as directed.  Will plan on  following up with  the patient in 2 weeks with [facility]  chest x-ray for  further evaluation for left pleural effusion as well as recheck the  patient's blood pressure.  The patient acknowledged understanding.   Kerin Perna, M.D.  Electronically Signed   KMD/MEDQ  D:  08/09/2007  T:  08/10/2007  Job:  [id number]   cc:   Kerin Perna, M.D.  Nanetta Batty, M.D.

## 2011-01-03 NOTE — Group Therapy Note (Signed)
NAMEEBBA, GOLL             ACCOUNT NO.:  [id number]   MEDICAL RECORD NO.:  [id number]          PATIENT TYPE:  WOC   LOCATION:  [facility]                   FACILITY:  [facility]   PHYSICIAN:  Allie Bossier, MD        DATE OF BIRTH:  November 04, 1960   DATE OF SERVICE:  10/11/2007                                  CLINIC NOTE   CHIEF COMPLAINT:  Menorrhagia status post IUD placement on 08/28/2007  with continued bleeding.   SUBJECTIVE:  The patient states that since her IUD placement on  08/28/2007, the volume of her bleeding has decreased and she is no  longer having clots However, she is having vaginal bleeding daily and  she is not happy with this result, and she desires hysterectomy. At her  last clinic visit, surgical options were discussed with the patient, and  it was determined at that time that she is not a candidate for surgery  at this time because she had a triple bypass surgery July 16, 2007.  She is a current smoker and is morbidly obese.  She is here today for a  second opinion regarding this.   PHYSICAL EXAMINATION:  Temperature 98.2.  Pulse 52.  Blood pressure  111/73.  Weight is 219.9 pounds.  Height is 5 feet 4 inches.  Her pulse  oximetry is 97%.  Generally she is an obese female in no acute distress.   ASSESSMENT/PLAN:  Ms. Hagey is a 50 year old female with menorrhagia  status post IUD placement on 08/28/2007 with decreased bleeding, but  daily bleeding, who desires hysterectomy.  Extensive discussion with the  patient about the risks of surgery with her comorbidities was  undertaken.  It was explained to her that her risk of going under  anesthesia and having a cardiac event or a pulmonary event and/or death,  is too high at this time given her comorbidities for her to be a  surgical candidate.  It was also explained to her that during the first  three months of having a Mirina IUD, bleeding is common.  The case was  also discussed with Dr. Mia Creek because  the patient wanted an  additional opinion, and he concurs with the opinion that her surgical  risk is too high at this time.   RECOMMENDATIONS:  Recommendations are that she stop smoking for at least  six months, reduce her weight and visit her cardiologist and obtain a  letter stating that she is safe to undergo a surgical procedure.  We  will obtain a pelvic ultrasound to check IUD placement given that there  was some difficulty with placement on 08/28/2007.  However, it was  reiterated that having bleeding during the first three months after IUD  placement is common and would recommend her leaving the IUD in for at  least three months before she judges its full effectiveness.  Patient  understands and is in agreement with this plan.  She is to return in  three months to discuss her progress with lifestyle modifications and  with a letter from her cardiologist, and to reassess her bleeding.    ______________________________  Shanda Bumps  Luz Brazen, Dr.    ______________________________  Allie Bossier, MD   JH/MEDQ  D:  10/11/2007  T:  10/11/2007  Job:  [id number]

## 2011-01-03 NOTE — Op Note (Signed)
[name], [name]             ACCOUNT NO.:  [id number]   MEDICAL RECORD NO.:  [id number]          PATIENT TYPE:  OBV   LOCATION:  5024                         FACILITY:  [facility]   PHYSICIAN:  Vania Rea. Supple, M.D.  DATE OF BIRTH:  [birth year]   DATE OF PROCEDURE:  01/30/2008  DATE OF DISCHARGE:                               OPERATIVE REPORT   PREOPERATIVE DIAGNOSES:  1. 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  2. Right knee medial and lateral meniscal tear.   POSTOPERATIVE DIAGNOSES:  1. 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  2. Right knee medial and lateral meniscal tear.  3. Chondromalacia of the patellofemoral joint.  4. Chondromalacia of the medial and lateral compartments.  5. Extensive diffuse synovitis.   PROCEDURE:  1. Right knee examination under anesthesia.  2. Right knee diagnostic arthroscopy.  3. Right knee allograft anterior cruciate ligament reconstruction      utilizing a bone-patellar tendon-bone allograft.  4. Partial medial and partial lateral meniscectomies.  5. Chondroplasty of the patellofemoral joint.  6. Chondroplasty of the medial and lateral compartments.  7. Extensive synovectomy.   SURGEON:  Vania Rea. Supple, MD   ASSISTANT:  Lucita Lora. Shuford, PA-C   ANESTHESIA:  General endotracheal as well as a preop femoral nerve  block.   TOURNIQUET TIME:  Approximately 1 hour and 15 minutes.   ESTIMATED BLOOD LOSS:  Minimal.   DRAINS:  None.   HISTORY:  Ms. Wasilewski is a 50 year old female who has had a chronic  right knee instability secondary to ACL insufficiency and has had  recently increased pain and functional limitation with no degenerative  tears of the menisci based on clinical and MRI criteria.  Due to her  ongoing pain, functional limitation, and symptomatic instability, she  was brought to the operating room for planned of  right knee arthroscopy  as described below.   Preoperatively, I counseled Ms. Caudell on treatment options as well  as risks versus benefits thereof.  Possible surgical complications  including infection, neurovascular injury, DVT, PE or thrombosis,  persistence of pain, loss of motion, anesthetic complication, and  possible need for additional surgery are reviewed.  She understands and  accepts and agrees with her planned procedure.   PROCEDURE IN DETAIL:  After undergoing routine preop evaluation, the  patient received prophylactic antibiotics.  A femoral nerve block was  established in the holding area by the anesthesia department.  Placed  supine on the operative table and underwent smooth induction of a  general endotracheal anesthesia.  A tourniquet was applied to the right  thigh, right leg was sterilely prepped and draped in standard fashion.  At this point, on the back table, the allograft was thawed and then  trimmed through 10-mm bone tunnels.  Once this was completed, we then  turned our attention to the right lower extremity, where it was  exsanguinated and a tourniquet inflated to 400 mmHg.  Standard  arthroscopic portals were established and diagnostic arthroscopy was  performed.  The  suprapatellar pouch and gutters showed diffuse and  extensive synovitis as is the anterior chamber and extensive synovectomy  was performed in all these areas.  The patellofemoral joint showed focal  grade 3+ chondromalacia in a linear fashion in the central aspect of the  trochlear groove with large chondral flaps.  A shaver was used to  debride these areas to stable cartilage margin.  The patellar cartilage  was in good condition.  The intercondylar notch showed some rimming  osteophytes with stenosis of the notch and obvious complete absence of  the ACL.  The PCL was intact.  We debrided the remnant to the ACL, which  was scarred into the notch.  In the medial compartment, there was   complex and extensive tear of the middle and posterior thirds of the  medial meniscus and this was trimmed back to a stable peripheral margin  with a basket.  And then shaver brought in for final contouring and  removal of the meniscal fragments.  I would estimate that approximately  60% of the medial meniscus was excised.  We also performed a  chondroplasty of the medial compartment for broad areas of grade 3  chondromalacia.  It is on both femur and the tibia.  Laterally, there  was a complex tear involving the middle and posterior thirds of the  lateral meniscus and a subtotal lateral meniscectomy was performed with  basket and shaver.  Also, performed a chondroplasty of the lateral  femoral condyle and there was an area of exposed bone on the lateral  femoral condyle approximately 2 x 2 cm over the weightbearing surface.  All these areas were debrided.  At this point, her attention was then  turned to the intercondylar notch, where we used an osteotome, as well  as bur to perform a notchplasty back to over-the-top position.  All  residual bony debris was meticulously were removed.  We then used the  tibial guide to bring a guide pin up from the proximal medial tibia  through a 3 cm incision, carried down to the proximal tibial cortex.  Next, a guide pin was then passed up to the footprint of the ACL and  then overdrilled with 10 mm reamer.  We then passed the femoral guide  and passed the guide pin up into the distal femur at the 11 o' clock  position and overdrilled this with a 10 mm reamer to the appropriate  depth.  A guide wire was removed.  We then performed irrigation of the  joint removing all residual bony debris.  A 2-pin passer was directed to  the tibial and femoral tunnels and brought up through a stab wound on  the anterolateral thigh.  A flexible guide wire was passed.  A 2-pin  passer was then used to pass the graft into position and obtaining  excellent interference fit  both proximally and distally.  An 8 x 25 mm  Bio-Interference screw was directed up into the femoral tunnels of the  guide wire obtaining excellent interference fit.  The guide wire was  removed.  At this point, took the knee through a range of motion, which  showed physiometric positioning of the graft and no evidence for  impingement in either full flexion or full extension.  We then placed  knee into approximately 20 degrees of flexion, tensioned the graft and  over guide wire directed an 8 x 25 mm Bio-Interference screw into the  tibial tunnel, again obtaining excellent interference fit.  At this  point, intraoperative Lachman was negative.  Good stability to ACL  examination.  The graft was then inspected and probed, found to have  good stability.  A final debridement was completed.  Fluid and  instruments were removed.  We did used the bur to trim down a very small  portion of the bone plug prominent from the tibial tunnel.  The incision  was then closed with 2-0 Vicryl and Monocryl for the skin 3-0.  Steri-  Strips applied to the portals and the incision as well as proximal stab  wounds.  A combination of Marcaine, morphine, epi, and clonidine was  instilled into the joint at the  end of the case.  Dry dressing was then wrapped about the knee.  The leg  was wrapped with Ace bandage and support stocking.  The tourniquet was  then let down.  The patient was awakened, extubated, and taken to  recovery room in stable condition.      Vania Rea. Supple, M.D.  Electronically Signed     KMS/MEDQ  D:  01/30/2008  T:  01/31/2008  Job:  [id number]

## 2011-01-03 NOTE — Cardiovascular Report (Signed)
[name], [name]             ACCOUNT NO.:  [id number]   MEDICAL RECORD NO.:  [id number]          PATIENT TYPE:  INP   LOCATION:  4735                         FACILITY:  [facility]   PHYSICIAN:  Ricki Rodriguez, M.D.  DATE OF BIRTH:  05-Apr-1961   DATE OF PROCEDURE:  12/18/2006  DATE OF DISCHARGE:                            CARDIAC CATHETERIZATION   PROCEDURE DONE:  Left heart catheterization and selective coronary  angiography.   INDICATION:  This 50 year old white female with recent myocardial  infarction had typical angina after discontinuing nitroglycerin for 1-2  days.   APPROACH:  Left femoral artery using 5 French sheath and catheters.   COMPLICATIONS:  None.   HEMODYNAMIC DATA:  1. The aortic pressure was 90/61.   1. Less than 50 mL of dye was used.   CORONARY ANATOMY:  1. The left main coronary artery was long with a distal eccentric 20%      plaque.   1. Left anterior descending coronary artery.  The left anterior      descending coronary artery had luminal irregularities followed by      proximal 40-50% eccentric lesion posterior diagonal 1 region, and      the distal half was diffusely narrow.  Diagonal 1 had proximal 30%      concentric lesion.   1. Left circumflex coronary artery.  The left circumflex coronary      artery had ostial 20% stenosis and then ran up to marginal branch      with luminal irregularities.   1. Right coronary artery.  The right coronary artery was dominant.  It      had a proximal calcification and ostial 20% narrowing followed by      proximal and distal luminal irregularities and 20-30% mid vessel      lesion.  The distal RCA and proximal posterolateral stent was      patent with some haziness in the mid stent segment extending into      the posterior descending coronary artery which was otherwise      unremarkable except for some distal disease.   IMPRESSION:  1. Moderate left anterior descending coronary disease, as before.  2. Patent right coronary artery/posterior descending artery stent.   RECOMMENDATIONS:  This patient will be treated medically.      Ricki Rodriguez, M.D.  Electronically Signed     ASK/MEDQ  D:  12/18/2006  T:  12/18/2006  Job:  [id number]

## 2011-01-03 NOTE — Op Note (Signed)
[name], [name]             ACCOUNT NO.:  [id number]   MEDICAL RECORD NO.:  [id number]          PATIENT TYPE:  WOC   LOCATION:  WOC                          FACILITY:  [facility]   PHYSICIAN:  Kerin Perna, M.D.  DATE OF BIRTH:  Jun 16, 1961   DATE OF PROCEDURE:  07/16/2007  DATE OF DISCHARGE:                               OPERATIVE REPORT   OPERATION:  1. Coronary artery bypass grafting x3 (left internal mammary artery to      the left anterior descending, saphenous vein graft to ramus      intermediate, saphenous vein graft to posterior descending).  2. Endoscopic vein harvest of bilateral thigh greater saphenous vein.   SURGEON:  Kerin Perna, M.D.   ASSISTANT:  Danelle Earthly, PA-C   ANESTHESIA:  General.   POSTOPERATIVE DIAGNOSIS:  Class IV unstable angina with severe three-  vessel coronary artery disease.   POSTOPERATIVE DIAGNOSIS:  Class IV unstable angina with severe three-  vessel coronary artery disease.   INDICATIONS:  The patient is a 50 year old obese smoker with prior  history of an inferior MI treated with a stent to the distal right  coronary earlier this year.  She now returns with symptoms of unstable  angina and a positive stress test.  Cardiac catheterization demonstrated  a high-grade proximal LAD stenosis of at least 80% involving the takeoff  of a ramus diagonal branch.  She also had stenosis of the posterior  descending from a previous distal RCA stent extending into the distal  right beyond the origin of the posterior descending.  She was felt to be  a candidate for surgical revascularization.  Prior to surgery, the  patient was placed on integralin for several days to allow her Plavix  wash out before this elective operation.   Prior to the operation, I examined the patient in her hospital room and  reviewed the results of the cardiac cath and 2-D echo with the patient.  I discussed the indications and expected benefits of coronary  bypass  surgery for treatment of her coronary artery disease.  I reviewed the  alternatives to surgical therapy as well.  I discussed with the patient  the major issues of the procedure including the choice of conduit to  include internal mammary artery and endoscopically harvested saphenous  vein, the use of general anesthesia and cardiopulmonary bypass, and the  expected postoperative hospital recovery.  I reviewed with the patient  the risks to her of coronary bypass surgery for treatment of her three-  vessel coronary artery disease including the risks of MI, CVA, bleeding,  blood transfusion requirement, infection, and death.  She understood due  to her heavy smoking history and short stature and obesity that she  would be at increased risk for pulmonary complications, and she  understood with her use of Plavix and preoperative Integrilin, she would  be at increased risk for bleeding or blood transfusion requirement, due  to her baseline anemia of hematocrit 30%.  After reviewing all these  issues, she demonstrated her understanding and agreed to proceed with  surgery under what I felt was  an informed consent.   OPERATIVE FINDINGS:  The mammary artery was a 1.5-mm vessel with good  flow.  Saphenous vein was harvested from both thighs.  It was too small  to use below the knee.  The patient received a unit of platelets after  reversal of the heparin with protamine due to persistent coagulopathy.  The patient received 2 units of packed cells in the operating room to  keep her hemoglobin above 8 grams.   PROCEDURE:  The patient was brought to the operating room and placed  supine on the operating room table where general anesthesia was induced.  The chest, abdomen, and legs were prepped with Betadine and draped as a  sterile field.   A sternal incision was made as the saphenous vein was harvested from  both legs using the endoscope.  The left IMA was harvested as a pedicle  graft  from its origin at the subclavian vessels and was a good vessel  with excellent flow.  Heparin was administered, and the sternal  retractor was placed using the deep blades due to her body habitus.  The  pericardium was opened and suspended.  Pursestrings were placed in the  ascending aorta and right atrium.  The patient was then cannulated after  heparin had been administered and the ACT was documented as being  therapeutic.  The vein had been previously inspected and found be  adequate.  The patient was then placed on bypass, and the coronaries  were identified for grafting.  The ascending aorta had a significant  thick plaque on the anteromedial aspect above the sinotubular junction.  The cannula and the vein graft anastomoses were kept away from this  area.  The vein and mammary artery were then prepared for the distal  anastomoses, and cardioplegia catheters were placed for both antegrade  and retrograde cold blood cardioplegia.  The patient was cooled to 32  degrees, and the aortic crossclamp was applied.  800 mL of cold blood  cardioplegia was delivered in split doses between the antegrade and  retrograde catheters.  There was good cardioplegic arrest and septal  temperature drop less than 14 degrees.   Cardioplegia was delivered every 20 minutes or less while the crossclamp  was in place.  The distal anastomoses were then performed.  The first  distal anastomosis was to the posterior descending branch of the RCA.  This was a 1.5-mm vessel with proximal 90% stenosis, and a reverse  saphenous vein was sewn end-to-side with running 7-0 Prolene with good  flow through graft.  The second distal anastomosis was to the ramus  intermediate branch of the left coronary.  This was a 1.5-mm vessel  underneath the atrial appendage which was intramyocardial.  It had a  tight proximal stenosis, and a reverse saphenous vein was sewn end-to-  side with running 7-0 Prolene with good flow through  the graft.  The  third distal anastomosis was to the mid-LAD.  This was a 1.5-mm vessel  with proximal 80-90% stenosis.  The left IMA pedicle was brought through  an opening created in the left lateral pericardium and was brought down  onto the LAD and sewn end-to-side with running 8-0 Prolene.  There was  excellent flow through the anastomosis after briefly releasing the  pedicle bulldog on the mammary artery.  The bulldog was reapplied and  the pedicle secured to the epicardium.  Cardioplegia was redosed.   While the crossclamp was still in place, the two proximal vein  anastomoses were performed on the ascending aorta using a 4.0-mm punch  with a running 7-0 Prolene.  Air was vented from the coronaries with a  dose of retrograde warm blood cardioplegia before tying down the final  proximal anastomosis.  The final proximal anastomoses were completed,  and the crossclamp was removed.   The heart resumed a spontaneous rhythm with reperfusion.  The  cardioplegia catheters were removed.  The vein grafts were de-aired with  a 27-gauge needle, and the grafts opened and each had good flow.  Hemostasis was checked at the proximal and distal anastomoses.  The  patient was rewarmed to 37 degrees, and temporary pacing wires were  applied.  When the patient was rewarmed and reperfused, the lungs were  expanded and the ventilator was resumed.  The patient was then weaned  from bypass without difficulty.  Cardiac output and blood pressure were  stable.  Protamine was administered without adverse reaction.  The  cannulas were removed.  The mediastinum was irrigated with warm  antibiotic irrigation.  The leg incision was irrigated and closed in a  standard fashion.  The superior pericardial fat was closed over the  aorta.  Two mediastinal and a left pleural chest tube were placed and  brought out through separate incisions.  The sternum was closed with  interrupted steel wire.  The pectoralis fascia  was closed with a running  #1 Vicryl.  The subcutaneous and skin layers were closed with a running  Vicryl, and sterile dressings were applied.  Total bypass time was 100  minutes with crossclamp time of 70 minutes.      Kerin Perna, M.D.  Electronically Signed     PV/MEDQ  D:  07/16/2007  T:  07/16/2007  Job:  [id number]   cc:   Nanetta Batty, M.D.  [facility]

## 2011-01-03 NOTE — Group Therapy Note (Signed)
[name], PARRACK NO.:  [id number]   MEDICAL RECORD NO.:  [id number]          PATIENT TYPE:  WOC   LOCATION:  [facility]                   FACILITY:  [facility]   PHYSICIAN:  Argentina Donovan, MD        DATE OF BIRTH:  1960/11/17   DATE OF SERVICE:  08/28/2007                                  CLINIC NOTE   CHIEF COMPLAINT:  IUD insertion.   HISTORY OF PRESENT ILLNESS:  This is a 50 year old female who is here  after seeing Dr. Darrol Angel a few weeks ago for dysfunctional uterine  bleeding.  The patient states she has very severe bleeding with large  clots very frequently.  She has had such severe bleeding that she is  anemic with a hemoglobin in the 11s.  She said usually the bleeding  occurs about once a month but lasts 10 days or longer, and she uses 12  to 14 pads each day.  She does not have much pain with this bleeding.  Of significance, she had coronary artery bypass surgery just over a  month ago for coronary artery disease.  It was a triple bypass, and,  therefore, is not a surgical candidate at this time.  It was felt that  her only option to help with this dysfunctional uterine bleeding at this  moment was a Mirena IUD.  She also has a history of DVTs, and,  therefore, cannot use oral contraceptives with estrogen.   PHYSICAL EXAMINATION:  Temperature 99.3, heart rate 66, blood pressure  109/72, weight 214 pounds, respiratory rate 20.  GENITOURINARY EXAM:  Cervix is interior, no lesions noted, no bleeding  noted at this time.   PROCEDURE NOTE:  IUD insertion:  The patient was consented and consents  were signed.  Tenaculum was placed at the 11 and 1 o'clock positions.  Sound was inserted to about the #7.  Mirena IUD was then inserted to the  same length.  Initially, the IUD was at the os, however, it was placed  back without complications.  Bleeding was minimal.  No other  complications at this time.  The patient was informed of how to check  her strings and  understands this.  She will call us if the bleeding  continues and wishes to desire hysterectomy or ablation.  Please note,  information on endometrial ablation was provided for the patient as this  likely is a better resolution then surgery.   ASSESSMENT AND PLAN:  As discussed above.  IUD placed.  The patient to  follow up as needed for dysfunctional uterine bleeding.  Hopefully, the  patient will choose to have an endometrial ablation, as her cardiac risk  factors are extremely high for a cardiac event during an operative  procedure; however, this can be discussed at a later date.     ______________________________  Earlean Polka, MD    ______________________________  Argentina Donovan, MD   JT/MEDQ  D:  08/28/2007  T:  08/28/2007  Job:  [id number]

## 2011-01-03 NOTE — Group Therapy Note (Signed)
[name], EXLINE             ACCOUNT NO.:  [id number]   MEDICAL RECORD NO.:  [id number]          PATIENT TYPE:  WOC   LOCATION:  [facility]                   FACILITY:  [facility]   PHYSICIAN:  Karlton Lemon, MD      DATE OF BIRTH:  [birth year]   DATE OF SERVICE:                                  CLINIC NOTE   CHIEF COMPLAINT:  Follow up results.   HISTORY OF PRESENT ILLNESS:  This is a 50 year old female presenting for  followup for evaluation of menorrhagia.  The patient reports periods  lasting 10 days and she will go through 12 to 14 pads a day.  She was  evaluated on June 27, 2007 by Dr. Silas Flood with labs showing hemoglobin  of 11.2, TSH 2.613, prolactin 4.0, Beta HCG quantitative level less than  2, and endometrial biopsy showing weak secretory type endometrium, a  negative Pap smear, and ultrasound showing normal sonographic appearance  of uterus and both ovaries.  The patient has recently been admitted  [facility] where she had triple bypass surgery.  She is here today to  discuss options for management of her menorrhagia.   PAST OBSTETRICAL HISTORY:  The patient is a G4 P2-0-2-2 with history of  cesarean section.   PAST MEDICAL HISTORY:  1. Colitis.  2. Coronary artery disease status post coronary bypass grafting times      three.  3. Depression.  4. Dyslipidemia.  5. Hypertension.  6. Diabetes.  7. History of deep venous thrombosis.   PAST SURGICAL HISTORY:  1. Coronary artery bypass grafting x3.  2. Heart catheterization x2 with 1 stent placed.  3. History of cesarean section.  4. Bilateral tubal ligation.  5. Ankle surgery.   MEDICATIONS:  Plavix, aspirin, Coumadin, Nitro patch, nitro tablets,  Lipitor, lisinopril, metoprolol, lithium, Xanax, Zoloft, trazodone,  Protonix.   PHYSICAL EXAMINATION:  Well-appearing female in no distress.  VITALS:  Temperature 97.7, pulse 66, blood pressure 114/77.  Remainder of exam was deferred as this is a results only  visit.   ASSESSMENT AND PLAN:  This is a 50 year old gravida 4 para 2,  menorrhagia and multiple other medical problems including recent history  of coronary artery bypass grafting.  The patient was counseled about  options for treatment of her menorrhagia.  Currently her only option is  [place] IUD.  At a later date, she is a candidate for endometrial  ablation or hysterectomy.  I will have her follow up in 3 weeks for  possible placement of IUD.  The patient is to consider this procedure  over that time.  And decide whether she wants a IUD placed.           ______________________________  Karlton Lemon, MD     NS/MEDQ  D:  08/07/2007  T:  08/08/2007  Job:  [id number]

## 2011-01-03 NOTE — Op Note (Signed)
[name], [name]             ACCOUNT NO.:  [id number]   MEDICAL RECORD NO.:  [id number]          PATIENT TYPE:  AMB   LOCATION:  [facility]                           [facility]   PHYSICIAN:  Phil D. Okey Dupre, M.D.     DATE OF BIRTH:  11/05/1960   DATE OF PROCEDURE:  12/19/2007  DATE OF DISCHARGE:                               OPERATIVE REPORT   PROCEDURE:  Removal of Mirena IUD and NovaSure endometrial ablation.   PREOPERATIVE DIAGNOSIS:  Intractable menorrhagia.   POSTOPERATIVE DIAGNOSIS:  Intractable menorrhagia.   SURGEON:  Javier Glazier. Rose, MD   ANESTHESIA:  MAC plus local.   ESTIMATED BLOOD LOSS:  Minimal.   POSTOPERATIVE CONDITION:  Satisfactory.   SPECIMENS TO PATHOLOGY:  None.   DESCRIPTION OF PROCEDURE:  The procedure went as follows:  Under  satisfactory MAC analgesia, with the patient placed in dorsolithotomy  position, the perineum and vagina were prepped and draped in usual  sterile manner.  Bimanual pelvic examination under anesthesia revealed  uterus of normal size, shape, and consistency.  Adnexa could not be well  outlined because of habitus of the patient.  A weighted speculum was  placed in the posterior fourchette of marital introitus.  BUS was within  normal limits.  Vagina is clean and well rugated.  The cervix was clean  and parous and grasped with a single-tooth tenaculum.  The endocervical  os was measured at 5 cm.  The uterine cavity was sounded to a depth of  10 cm.  A 10 mL of 1% Xylocaine was injected at 4 o'clock and another 10  at 8 o'clock in the paracervical area for further patient comfort.  The  packing forceps was used to easily remove the Mirena IUD.  Cervical os  was dilated to a #8 Hegar dilator and NovaSure endometrial ablator was  inserted into the cavity, so the fundus was locked, opened, and  manipulated and was used to get a good seating in the cavity in the  usual method.  Cavity width could only be obtained 2.7 cm, power of  74/120  seconds was used to ablate the endometrial cavity.  The  instrument was then removed in the usual manner and the tenaculum and  speculum were removed from the vagina.  Cervix was observed for any  bleeding since the patient had been on anticoagulants.  The patient was  transferred to the recovery room in satisfactory condition.      Phil D. Okey Dupre, M.D.  Electronically Signed     PDR/MEDQ  D:  12/19/2007  T:  12/20/2007  Job:  [id number]
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
## 2011-01-03 NOTE — Discharge Summary (Signed)
[name], [name]             ACCOUNT NO.:  [id number]   MEDICAL RECORD NO.:  [id number]          PATIENT TYPE:  INP   LOCATION:  2003                         FACILITY:  [facility]   PHYSICIAN:  Theda Belfast, PA DATE OF BIRTH:  1960-10-05   DATE OF ADMISSION:  07/10/2007  DATE OF DISCHARGE:  07/19/2007                               DISCHARGE SUMMARY   FINAL DIAGNOSIS:  Class IV unstable angina with severe three-vessel  coronary artery disease.   IN-HOSPITAL DIAGNOSES:  1. Acute blood loss anemia.  2. Volume overload.  3. Postoperative leukocytosis.  4. Postoperative ileus.   SECONDARY DIAGNOSES:  1. Manic-depressive disorder followed by Dr. Allyne Gee in psychiatry.  2. Active smoking with chronic obstructive pulmonary disease.  3. Hypertension.  4. Borderline diabetes mellitus.  5. Right knee instability status post surgery.  6. History of drug abuse.  7. Chronic Plavix therapy.   IN-HOSPITAL OPERATIONS AND PROCEDURES:  1. Cardiac catheterization.  2. Flexible bronchoscopy.  3. Coronary artery bypass grafting x3 using a left internal mammary      artery to left anterior descending, saphenous vein graft to the      ramus intermedius, saphenous vein graft to posterior descending.  4. Endoscopic vein harvest from bilateral thighs.   HISTORY AND PHYSICAL/HOSPITAL COURSE:  The patient is a 50 year old  abuse smoker with prior history of inferior MI treated with a stent to  the distal right coronary artery earlier this year.  She now returns  with symptoms of unstable angina and positive stress test.  Cardiac  catheterization demonstrated high-grade proximal LAD stenosis of at  least 80% involving the takeoff of the ramus diagonal branch.  She also  had stenosis of the posterior descending from a previous distal RCA  stent extending into the distal right beyond the origin of the posterior  descending.  The patient was felt to be a candidate for surgical  revascularization.   The patient was seen and evaluated by Dr. Donata Clay.  Dr. Donata Clay discussed with the patient undergoing coronary bypass  grafting.  He discussed the risks and benefits with the patient.  The  patient acknowledged her understanding and agreed to proceed.  The  patient was placed on Integrilin for several days to allow her Plavix to  wash out before this elective operation.  She was scheduled for surgery  for July 17, 2007.  Preoperatively, she did undergo bilateral  carotid duplex ultrasound showing on the right to have 60-80% ICA  stenosis with on the left no ICA stenosis noted.  She also had  preoperative ABIs done on the right showing to be greater than 1, on the  left to be 0.97.  The patient remained stable preoperatively.  For  details of the patient's past medical history and physical exam, please  see dictated H&P.   The patient was taken to the operating room July 17, 2007 where she  underwent coronary bypass grafting x3 using a left internal mammary  artery to left anterior descending, saphenous vein graft to ramus  intermedius, saphenous vein graft to the posterior descending.  Endoscopic vein harvest  from the bilateral thighs was done.  The patient  tolerated this procedure well and was transferred to the intensive care  unit in stable condition.  Postoperatively, the patient was noted to be  hemodynamically stable.  She was noted, after transfer to the intensive  care unit, to have atelectasis of the right upper lobe.  Flexible  bronchoscopy was done by Dr. Donata Clay.  The plan was to continue to  wean from the vent after this procedure.  The patient was able to be  extubated the evening of surgery.  Post extubation, the patient was  noted to be alert and oriented x4.  Neuro intact.  While in the  intensive care unit, the patient's vital signs were monitored.  She was  able to be weaned off all drips.  Swan-Ganz catheter was discontinued in  the normal fashion.   Followup chest x-ray was shown to be stable.  She  had minimal drainage from the chest tube, and the chest tube was  discontinued in a normal fashion.  While in the ICU, the patient  developed nausea and vomiting.  It was felt that she had developed a  mild postoperative ileus, and her diet was changed to clear liquids.  The patient had mild bowel sounds and this was monitored.  The patient's  hemoglobin and hematocrit was also noted to be decreased.  By postop day  #2, it was 7.6 and 23.8.  She received 1 unit of packed red blood cells.  This was monitored.  While in the intensive care unit, the patient was  working with cardiac rehab who felt that she was improving.  She was  felt stable to transfer to 2000 by postop day #2.  On the telemetry  floor, the patient's vital signs continued to be monitored.  She  remained afebrile.  The patient remained in normal sinus rhythm, and  blood pressure was stable.  Currently she remains on 2 liters nasal  cannula but is attempting to wean off for sats greater than 90%.  The  patient had been started on diuretics while in the intensive care unit  for volume overload.  Daily weights were obtained.  The patient still  had slight volume overload and continued diuretics.  She was back to her  baseline prior to discharge.  She did have leukocytosis.  The patient  was afebrile with no signs of infection.  This was monitored and  trending down prior to discharge home.  Hemoglobin and hematocrit was  rechecked on the telemetry floor and had improved appropriately  following transfusion.  It was 9 and 27.3 on postop day #3.  The  patient's ileus was improving by postop day #3.  No more vomiting noted.  She was having some flatus with no bowel movement yet.  Her diet was  increased as tolerated.  Will continue to monitor prior to discharge.  The patient's pulmonary status continued to improve.  As stated above,  we were attempting to wean her off oxygen.  She  remained in normal sinus  rhythm.  All incisions were clean, dry and intact and healing well.  She  continued to ambulate well with cardiac rehab.  Case manager is  currently consulted for home arrangements.   DISPOSITION:  The patient is tentatively ready for discharge home in the  next 1-2 days pending discharge planning arranged.   FOLLOWUP APPOINTMENTS:  Followup appointment will be arranged with Dr.  Donata Clay in 3 weeks.  Our office will  contact the patient with this  information.  The patient will need to obtain PA and lateral chest x-ray  30 minutes prior to this appointment.  The patient will need to follow  up with Dr. Allyson Sabal in 2 weeks.  She needs to contact Dr. Hazle Coca office  to make these arrangements.   ACTIVITY:  Patient instructed on no driving until released to do so, no  heavy lifting over 10 pounds.  She was told to ambulate 3-4 times a day,  progress as tolerated, and to continue her breathing exercises.   DIET:  The patient educated on diet to be low-fat, low-salt as well as  carbohydrate modified medium calorie diet.   LABORATORIES:  Postop day #3 showed a white count of 17, hemoglobin of  9, hematocrit of 27.3, platelet count 224.  Sodium of 137, potassium  3.9, chloride of 97, bicarbonate 33, BUN 11, creatinine 0.85, glucose  117.   DISCHARGE MEDICATIONS:  1. Aspirin 325 mg daily.  2. Lipitor 20 mg at night.  3. Potassium chloride 20 mEq daily x5 days.  4. Lithium 900 mg at night.  5. Alphagan 1 drop to both eyes b.i.d.  6. Zoloft 100 mg daily.  7. Advair 250/50, 1 puff b.i.d.  8. Trazodone 200 mg at night.  9. Plavix 75 mg daily.  10.Lasix 40 mg daily x5 days.  11.Oxycodone 5 mg 1-2 tablets q.4-6 h. p.r.n. pain.      Theda Belfast, PA     KMD/MEDQ  D:  07/19/2007  T:  07/19/2007  Job:  [id number]   cc:   Kerin Perna, M.D.  Nanetta Batty, M.D.

## 2011-01-03 NOTE — Cardiovascular Report (Signed)
NAME:  [name], [place] NO.:  [id number]   MEDICAL RECORD NO.:  [id number]          PATIENT TYPE:  WOC   LOCATION:  WOC                          FACILITY:  [facility]   PHYSICIAN:  Nanetta Batty, M.D.   DATE OF BIRTH:  [birth year]   DATE OF PROCEDURE:  07/10/2007  DATE OF DISCHARGE:                            CARDIAC CATHETERIZATION   [name] is a 50 year old mildly overweight white female with  history of hypertension, COPD with ongoing tobacco abuse, history of  drug abuse, and bipolar disorder.  She had RCA stenting December 10, 2006  by Dr. Sharyn Lull using a Cypher drug-eluting stent.   This was in her distal right coronary artery crossing the PDA into her  PLA.  She also had moderate LAD disease at that time.  She has had  progressive angina and presents now for outpatient diagnostic coronary  angiography to define her anatomy and rule out ischemic etiology.   DESCRIPTION OF PROCEDURE:  The patient brought to the second floor [facility]  of cardiac cath lab in the postabsorptive state.  She was premedicated  with  p.o. Valium.  Right groin was prepped and shaved in the usual  sterile fashion.  Once the Xylocaine was used for local anesthesia, a 6-  [place] sheath was inserted into the right femoral artery using standard  Seldinger technique.  A 6-French left Judkins diagnostic catheter along  with 5-French JR-4 catheter were used for selective cholangiography,  left ventriculography, distal abdominal aortography.  Visipaque dye was  used for the entirety of the case.  Retrograde aortic, left ventricular  and __________ pressures were recorded.   HEMODYNAMIC RESULTS:  1. Aortic systolic pressure 153, diastolic pressure 82.  2. Ventricle systolic pressure 153, end-diastolic pressure 20.   SELECTIVE ANGIOGRAPHY:  1. Left main normal.  2. Left anterior descending:  The LAD gave off a large first diagonal      branch __________ proximal third.  Just beyond this was an      eccentric 70% lesion which was fairly focal.  Beyond this was an      aneurysmal dilatation of the LAD.  3. Circumflex:  Nondominant, free of disease.  4. Right coronary:  Dominant vessel with 40-50% segmental proximal and      distal.  The stent straddling the PDA was widely patent.  There was      apparently is 70% lesion at the ostium of the PDA just as it comes      off from within the stented segment.   LEFT VENTRICULOGRAPHY:  RAO left ventriculogram performed using 25 mL of  Visipaque dye at 12 mL per second.  The overall LVEF was estimated  greater 60% without focal wall motion abnormalities.   DISTAL ABDOMINAL AORTOGRAPHY:  Distal abdominal aortogram was performed  using 20 mL of Visipaque dye at 20 mL per second.  The renal arteries  were widely patent.  Infrarenal abdominal aorta and iliac bifurcation  were __________.   IMPRESSION:  Ms. [name] has disease in the posterior descending  artery and left anterior descending.  Difficult to determine which, if  any,  is the culprit vessel.  The left anterior descending is stentable;  however, it is high-risk.  Given the aneurysmal  segment with the  possibility of lack of stent apposition.  In addition, angioplasty of  the posterior descending artery ostium to the stent shot probably would  result in a suboptimal result and would require stenting as well.  Sheath was removed and pressure was held on the groin until achieved  hemostasis.  The patient left the lab in stable condition.   I plan to review the cine angiograms with my colleagues prior to making  a final decision with regard to medical therapy versus intervention.  She left lab in stable condition.      Nanetta Batty, M.D.  Electronically Signed     JB/MEDQ  D:  07/10/2007  T:  07/10/2007  Job:  [id number]   cc:   [facility]  [facility]  [facility]

## 2011-01-03 NOTE — Op Note (Signed)
NAMEATLEE, VILLERS             ACCOUNT NO.:  [id number]   MEDICAL RECORD NO.:  [id number]          PATIENT TYPE:  INP   LOCATION:  2303                         FACILITY:  [facility]   PHYSICIAN:  Kerin Perna, M.D.  DATE OF BIRTH:  1961/04/30   DATE OF PROCEDURE:  07/16/2007  DATE OF DISCHARGE:                               OPERATIVE REPORT   OPERATION:  Flexible bronchoscopy.   PREOPERATIVE DIAGNOSIS:  Atelectasis of the right upper lobe, status  post CABG from airway secretions.   POSTOPERATIVE DIAGNOSIS:  Atelectasis of the right upper lobe, status  post CABG from airway secretions.   ANESTHESIA:  IV sedation.   PROCEDURE:  The patient is a 51 year old obese smoker who earlier today  had CABG x3 for severe coronary disease and unstable angina.  She  returned to the ICU with fairly with low oxygen saturation and a chest x-  ray showed atelectasis of the right upper lobe.  Her ET tube was pulled  back 2 cm without change in the appearance of her atelectatic upper lobe  and it was felt that bronchoscopic suctioning of the secretions would  improve her aeration.   PROCEDURE:  The patient was given IV sedation in the ICU and she was on  the ventilator.  Through the ET tube a flexible bronchoscope was passed  to the carina.  It was passed in the right mainstem bronchus and right  upper lobe where heavy secretions were removed and some were sent for  culture.  After all secretions were removed the mucosa was inspected and  there no endobronchial lesions noted or anatomic variations.  Next the  bronchoscope was passed into the left mainstem bronchus and the  secretions were also removed from the left upper and left lower lobes.  The bronchoscope was then removed.      Kerin Perna, M.D.  Electronically Signed     PV/MEDQ  D:  07/16/2007  T:  07/16/2007  Job:  [id number]

## 2011-01-03 NOTE — Group Therapy Note (Signed)
NAMEJACIA, [name]             ACCOUNT NO.:  [id number]   MEDICAL RECORD NO.:  [id number]          PATIENT TYPE:  WOC   LOCATION:  [facility]                   FACILITY:  [facility]   PHYSICIAN:  Allie Bossier, MD        DATE OF BIRTH:  August 17, 1961   DATE OF SERVICE:                                  CLINIC NOTE   REASON FOR VISIT:  Follow up endometrial ablation done December 19, 2007,  by Dr. Okey Dupre.   INTERVAL HISTORY:  The patient has been doing well and has had only  scant spotting along with serosanguineous vaginal discharge which is  mostly watery and pink.  She is not sexually active, does not have a  partner.  Her main concern today is that she has noticed some swelling  in her hands, feet, and face and this is despite trying to follow a low-  sodium, low-carbohydrate diet and also of note has gained over 4 pounds  since her visit here on April 8.  She denies any shortness of breath or  dyspnea on exertion.  Her cardiologist is Dr. Gery Pray and she was plans to  make an appointment with him soon.  As far as her psych issues, she  states she is doing well on lithium and Zoloft.  Smoking is still a  battle and she is smoking around a half a pack a day.   VITAL SIGNS:  97.7.  Weight 231.9.  Height 5 feet 4.   PHYSICAL EXAM:  Deferred today.  She is in no distress   ASSESSMENT:  A 50 year old now 2 weeks status post NovaSure endometrial  ablation, doing well.   PLAN:  She is advised of the normalcy of the serous pink vaginal  discharge and that it will take a few months to evaluate whether the  ablation is effective.  She will be coming back in about 6 months for  her Pap smear and we will see how she is doing as far as any vaginal  bleeding at that time.  Again, she is urged to stop smoking and will  follow up with her cardiologist and psychologist in the near future.     ______________________________  Caren Griffins, CNM    ______________________________  Allie Bossier, MD    DP/MEDQ  D:  01/03/2008  T:  01/03/2008  Job:  [id number]

## 2011-01-03 NOTE — Assessment & Plan Note (Signed)
OFFICE VISIT   [name], [name]  DOB:  June 04, 1961                                        August 23, 2007  CHART #:  [id number]   The patient is status post coronary artery bypass grafting x3 done  July 16, 2007 by Dr. Donata Clay.  Postoperative course, did have a  mild ileus as well as acute bullus anemia and some volume overload .  This all stabilized and the patient was discharged to home.  She was  seen in the office for followup visit August 09, 2007.  At that time  chest x-ray showed a mild left pleural effusion.  She was given a week's  dose of Lasix as well as potassium and followup appointment was arranged  for 2 weeks.  At this time, the patient was also placed on Lopressor 25  mg daily due to elevated blood pressure.  The patient presents today for  a followup visit.  She states that she is without complaints.  Denies  any pain, shortness of breath, nausea or vomiting, headaches, or  dizziness.  The patient states she is starting cardiac rehab on Monday,  August 26, 2007.  She is ambulating without difficulty, tolerating diet  well.  She denies any drainage or opening from any of her incision  sites.  The patient has been driving without difficulty.  The patient  does admit to still smoking about 17 cigarettes per day.   PHYSICAL EXAM:  Vital signs:  Blood pressure 124/78, pulse 72,  respirations 20, O2 saturation 97% on room air.  Respiratory is clear to  auscultation bilaterally.  Cardiac is regular rate and rhythm.  S1, S2  noted.  No murmurs, rubs, or gallops noted.  Abdomen:  Positive bowel  sounds.  Soft and nontender.  Extremities are warm to touch.  No edema  noted.   INCISIONS:  All incisions are clean, dry, and intact, and healing well.   STUDIES:  PA and lateral chest x-ray done August 23, 2007 showing  cleared left pleural effusion.  Chest x-ray is stable.   IMPRESSION AND PLAN:  The patient is status post coronary artery  bypass  grafting x3.  She is progressing well.  The patient seen and evaluated  by Dr. Donata Clay.  Discussed with the patient the importance of quitting  smoking.  Prescription for Nicoderm patch 17 mg per 24 hours was given.  The patient states that she will try to stop smoking.  The patient is to  continue ambulating 3 to 4 times per day.  She is to continue using her  incentive spirometer.  Has agreed to start rehab on January 5.  She is  to follow up with Dr. Allyson Sabal as directed.  Plan to release the patient  from our office, and she is to contact us with any surgical questions.  The patient nods her understanding.   Kerin Perna, M.D.  Electronically Signed   KMD/MEDQ  D:  08/23/2007  T:  08/23/2007  Job:  [id number]   cc:   Nanetta Batty, M.D.

## 2011-01-06 ENCOUNTER — Other Ambulatory Visit: Payer: Self-pay | Admitting: Internal Medicine

## 2011-01-06 DIAGNOSIS — Z1231 Encounter for screening mammogram for malignant neoplasm of breast: Secondary | ICD-10-CM

## 2011-01-06 NOTE — Cardiovascular Report (Signed)
NAMEROLAND, PRINE             ACCOUNT NO.:  [id number]   MEDICAL RECORD NO.:  [id number]          PATIENT TYPE:  OUT   LOCATION:  CATH                         FACILITY:  [facility]   PHYSICIAN:  Ricki Rodriguez, M.D.  DATE OF BIRTH:  [birth year]   DATE OF PROCEDURE:  12/10/2006  DATE OF DISCHARGE:                            CARDIAC CATHETERIZATION   HOSPITAL LOCATION:  [facility] at 1419   LEFT HEART CATHETERIZATION:  Coronary angiography.   INDICATIONS FOR PROCEDURE:  This 50 year old white female with diabetes,  hypertension, smoking, obesity had recurrent chest pain.   APPROACH:  Right femoral artery using 4 French sheath and catheters,  changed over at band of the procedure to 7 [place] sheath and 7 French  right femoral vein.   COMPLICATIONS:  Post coronary angiography:  The patient had inferior MI  with a bradycardia and hypotension.   CORONARY ANATOMY:  The left main coronary artery was long with mild  diffuse disease.   Left anterior descending coronary artery:  The left anterior descending  coronary artery showed a small plaque in its proximal area with a 40%  eccentric lesion and a luminal irregular arteries in the distal area.  The diagonal vessel had minor disease.   Left circumflex coronary artery:  The circumflex coronary artery was a  small vessel with mild diffuse disease and a very small obtuse marginal  branch 1 and 2.   Right coronary artery:  The right coronary artery had a diffuse mild  disease along with 40% mid vessel and 40% osteal posterior descending  coronary artery disease.  The posterior lateral branch has a mild mid  vessel disease and luminal irregularities.   IMPRESSION:  1. Moderate multivessel and native vessel coronary artery disease.  2. Acute right coronary artery occlusion with inferior wall myocardial      infarction as a complication.   PLAN:  The patient was given IV 10,000 heparin, IV Integrilin, IV  fluids, and  temporary 5 French balloon tip pacemaker was placed at the  rate of 60 and 5 MA.  Dr. Sharyn Lull was notified for further angioplasty.      Ricki Rodriguez, M.D.  Electronically Signed    ASK/MEDQ  D:  12/10/2006  T:  12/10/2006  Job:  [id number]

## 2011-01-06 NOTE — Cardiovascular Report (Signed)
[name], [name]             ACCOUNT NO.:  [id number]   MEDICAL RECORD NO.:  [id number]          PATIENT TYPE:  OUT   LOCATION:  CATH                         FACILITY:  [facility]   PHYSICIAN:  [name], M.D. DATE OF BIRTH:  01-29-1961   DATE OF PROCEDURE:  DATE OF DISCHARGE:                            CARDIAC CATHETERIZATION   DATE OF PROCEDURE:  12/10/2006   PROCEDURES:  1. Emergency PTCA to distal RCA using 2.5 x 12-mm long Voyager      balloon.  2. Successful retrieval of thrombus from the distal RCA using fetch      aspiration catheter.  3. Successful deployment of 2.75 x 23 mm long Cypher drug-eluting      stent in distal RCA going to PLV branches.  4. Successful post dilatation of this Cypher drug-eluting stent using      2.75  x 13-mm long PowerSail balloon.   INDICATIONS FOR PROCEDURE:  [name] is 50 year old white female  with past medical history significant for gestational diabetes mellitus,  hypertension, history of tobacco abuse, hypercholesteremia, positive  family history of coronary artery disease who was admitted by Dr.  Algie Walsh on 12/08/06 because of retrosternal sharp and pressure type  chest pain lasting 5-10 minutes associated with tingling to the left  arm.  MI was ruled out by serial enzymes and EKG.  The patient  subsequently underwent a cardiac cath by Dr. Algie Walsh which showed a mild  to moderate lesion in LAD and while injecting into RCA the patient had  segmental occlusion of RCA with TIMI zero with 3-4 mm ST elevation in  inferior leads.  I was called for emergency PTCA.  The patient already  had __________ floppy extra-support wire in midportion of RCA and also  had temporary pacemaker placed in by Dr. Algie Walsh  prior to the  procedure.  Interventional procedure:  Successful PTCA to distal RCA was  done using 2.5 x 12-mm long Voyager balloon without improvement in  distal flow and then fetch catheter aspiration catheter was used to  aspirate the thrombus with improvement in TIMI flow to I, but persistent  haziness.  Multiple intracoronary infusions of verapamil and nitro were  given with improvement in flow to TIMI III in PDA with persistent  occlusion of PLV branches, and then using double wire technique BMW  0.014 wire was passed to PLV branches without difficulty and then PTCA  to PLV branch was done using 2.0 x 8-mm long Voyager balloon.  Lesion  was dilated from 100% to 60-70% but still with persistent haziness  suggestive of thrombus in PLV and PDA branches.  And then multiple  inflations were done in PLV branches right at the bifurcation with PDA  without any improvement in blood flow with persistent haziness and  questionable localized dissection, and then 2.75 x 23-mm long Cypher  drug-eluting stent was deployed in distal RCA going to PLV branch at 13  atmospheric pressure.  Stent was postdilated at the end using 2.75 x 13-  mm long PowerSail balloon going up to 18 atmospheres pressure.  Lesion  was dilated from 100% to 0%  with excellent TIMI grade 3 distal flow  without evidence of dissection or distal embolization in PLV branch.  Angiogram also showed persistent haziness at the bifurcation with PDA  and then PDA was rewired through stent and PTCA to ostial PDA and distal  RCA was done initially using 2.0 x 8-mm long and then 2.0 x 8-mm long  Voyager balloon.  Lesion dilated from 100% to less than 20% and due to  elastic recoil with excellent TIMI grade 3  distal flow without evidence of dissection or distal embolization.  The  patient tolerated procedure well.  There are no complications.  Temporary pacer was discontinued at the end of the procedure.  IV  dopamine was also weaned off at the end of the procedure. The patient  was transferred to recovery room in stable condition.           ______________________________  [name], M.D.     MNH/MEDQ  D:  12/10/2006  T:  12/10/2006  Job:   [id number]   cc:   [name], M.D.

## 2011-01-06 NOTE — Discharge Summary (Signed)
[name], [name]             ACCOUNT NO.:  [id number]   MEDICAL RECORD NO.:  [id number]          PATIENT TYPE:  INP   LOCATION:  2040                         FACILITY:  [facility]   PHYSICIAN:  Ricki Rodriguez, M.D.  DATE OF BIRTH:  [birth year]   DATE OF ADMISSION:  12/08/2006  DATE OF DISCHARGE:  12/13/2006                               DISCHARGE SUMMARY   FINAL DIAGNOSES:  1. Coronary atherosclerosis of native coronary vessel.  2. Acute myocardial infarction inferior wall.  3. Abnormal reaction to cardiac catheterization.  4. Hypertension.  5. Diabetes mellitus type 2.  6. Tobacco use disorder.  7. Morbid obesity.  8. Hyperlipidemia.  9. Manic depressive disorder.   PRINCIPAL PROCEDURE:  Left heart catheterization and selective coronary  angiography with left ventricular function study done by Dr. Orpah Cobb on December 10, 2006.  Insertion of stent placement by Dr. Rinaldo Cloud on December 10, 2006.   DISCHARGE MEDICATIONS:  1. Aspirin 325 mg one daily.  2. Plavix 75 mg one daily.  3. Lopressor 50 mg half twice daily.  4. Zocor 40 mg one daily.  5. Lithium carbonate 300 mg 3 times daily.  6. Nicotine 40 mg 24-hour patch daily.  7. Colace 100 mg daily.  8. Nitroglycerin 0.2 mg per hour apply new one in a.m. off at bedtime.  9. Lisinopril 5 mg one daily.  10.Nitroglycerin 0.4 mg tablet one sublingual every 5 minutes x3 as      needed for chest pain.  11.Xanax 0.25 mg one twice daily as needed.   DISCHARGE DIET:  Low-sodium heart-healthy diet.   DISCHARGE ACTIVITY:  The patient to increase activity slowly.   WOUND CARE INSTRUCTIONS:  The patient to notify right groin pain  swelling or discharge.   SPECIAL INSTRUCTIONS:  The patient to stop any activity that causes  pain, shortness of breath, dizziness, sweating or excessive weakness.   FOLLOW-UP:  Dr. Orpah Cobb in 2 weeks.  The patient to call [phone]  for appointment.   HISTORY:  This 50 year old white female  presented with retrosternal  pressure type and sharp chest pain that lasted about 5-10 minutes along  with tingling to the left arm. The patient had similar episodes in the  past along with some sweating spells, shortness of breath and dizziness.  Cardiac risk factors of borderline diabetes, hypertension, smoking and  family history of premature coronary artery disease.   PHYSICAL EXAMINATION:  Temperature 98, pulse 68, respirations 16, blood  pressure 129/62, height 5 feet 4 inches, weight 180 pounds.  GENERAL:  The patient is averagely built and well nourished, white  female in mild distress.  HEENT:  The patient is normocephalic, atraumatic with conjunctivae pink.  Sclerae nonicteric. Ears, nose, throat unremarkable.  NECK:  No JVD, no carotid bruit.  LUNGS:  Clear bilaterally.  HEART:  Normal S1, S2.  ABDOMEN:  Soft and nontender but distended.  EXTREMITIES:  No edema, cyanosis, clubbing.  SKIN:  Warm and dry.  NEUROLOGICALLY:  The patient moves all four extremities.   LABORATORY DATA:  Normal hemoglobin/hematocrit, WBC count, platelet  count, normal electrolytes,  BUN, creatinine. Glucose slightly elevated  at 213. CK 1643 with an MB of 223.  Subsequent CK was down to 684 with  an MB of 42, troponin I was as high as 32.43.  Subsequent troponin I was  22.51. Chest x-ray showed cardiomegaly and mild right lower lobe  atelectasis. EKG showed initially normal sinus rhythm and a subsequent  EKG showed acute inferior myocardial infarction. Cardiac catheterization  showed 40% eccentric lesion in left anterior descending coronary artery  and diffuse mild disease with a 40% mid vessel and 40% ostial PDA.  Post  procedure there was acute right coronary artery occlusion with inferior  wall MI as a complication.   HOSPITAL COURSE:  The patient was admitted to telemetry unit.  She was  given IV heparin, sublingual nitroglycerin, aspirin and Plavix along  with Lipitor and beta blocker. Her  condition remained stable for 48  hours. On December 10, 2006, she underwent cardiac catheterization that  showed moderate multivessel coronary artery disease however, she  suffered acute right coronary artery occlusion following her cardiac  catheterization procedure at which time 10,000 units of heparin was  given, IV Integrilin was given and an external pacemaker was placed in  the right ventricle. Dr. Sharyn Lull was notified of emergent PTCA which was  successfully carried out using 2.75-mm x 23-mm long Cypher stent.  The  lesion was reduced to less than 20% with excellent TIMI grade 3 flow.  The patient's subsequent hospitalization remained stable and on December 13, 2006 he was discharged home in satisfactory condition with a follow-  up by me in 2 weeks.      Ricki Rodriguez, M.D.  Electronically Signed     ASK/MEDQ  D:  05/31/2007  T:  05/31/2007  Job:  [id number]

## 2011-01-06 NOTE — H&P (Signed)
[name]             ACCOUNT NO.:  [id number]   MEDICAL RECORD NO.:  [id number]          PATIENT TYPE:  INP   LOCATION:  4735                         FACILITY:  [facility]   PHYSICIAN:  Ricki Rodriguez, M.D.  DATE OF BIRTH:  05-05-1961   DATE OF ADMISSION:  12/17/2006  DATE OF DISCHARGE:                              HISTORY & PHYSICAL   CHIEF COMPLAINT:  Chest pain.   HISTORY OF PRESENT ILLNESS:  This 50 year old white female complains of  retrosternal pressure type chest pain radiating to left arm along with a  heaviness feeling in the left arm.  The patient was out of nitroglycerin  tablet after recent stent placed in the right coronary artery for acute  inferior wall myocardial infarction.  The patient also had proximal left  anterior descending 60-70% stenosis and that has not been treated yet.   PAST MEDICAL HISTORY:  1. Positive for diabetes for 23 years (gestational diabetic).  2. Positive for hypertension for 2 years.  3. History of smoking 1.5 packs of cigarettes per day x25 years.  No      history of alcohol intake, drug abuse or elevated cholesterol      level.  4. Positive history of recent myocardial infarction as a complication      of the cardiac catheterization procedure.  5. Questionable history of exercise.   Positive family history of coronary artery disease to father who had  bypass graft surgery in his 41s.   MEDICATIONS:  1. Toprol XL 25 mg twice daily.  2. Lithium 300 mg 3x daily.  3. Xanax 0.25 mg one twice daily.  4. Pravachol 40 mg one daily.  5. Plavix 75 mg one daily.  6. Aspirin 325 mg one daily.  7. Lisinopril 5 mg one daily.   Family history  Mom alive at 49 years old.  Father not known.  Has one brother who is  living, one half brother who is living, one sister and one half sister  and they are both living.   PERSONAL HISTORY:  The patient is divorced for 15 years.  Has one son, a  17 year old and one daughter, a 58 year old.   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   ALLERGIES:  NO KNOWN DRUG ALLERGIES.   REVIEW OF SYSTEMS:  The patient has gained significant weight in the  last 15 years.  Has reading glasses.  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  Has a history of  polydipsia, polyuria.  No history of asthma.  Positive history of chest  pain and palpitations, dizziness, shortness of breath and leg swelling.  No history of nausea, vomiting, diarrhea.  Positive history of  constipation and gas.  Negative history of kidney stones, strokes,  seizures.  Positive history of psychiatric admissions for drug use and  depression.   PHYSICAL EXAMINATION:  Temperature 98, pulse 68, respirations 16, blood  pressure 120/60, height 5 feet 4 inches, weight approximately 180  pounds.  HEENT: The patient is normocephalic, atraumatic with pupils equal and  reactive to light.  Extraocular movement intact.  Conjunctiva pink.  Sclerae are nonicteric.  NECK:  No JVD.  No carotid bruit.  LUNGS:  Clear bilaterally.  HEART:  Normal S1-S2 without S3 gallop.  ABDOMEN:  Soft, distended but nontender.  EXTREMITIES:  No edema, cyanosis, clubbing.  SKIN:  Warm and dry.  CNS: The patient moves all four extremities and has bilateral equal  grips.   LABORATORY DATA:  Pending.   EKG; sinus rhythm with recent inferior wall myocardial infarction.   IMPRESSION:  1. Recent inferior wall myocardial infarction.  2. Angina.  3. Schizophrenia.  4. Hypertension.  5. Obesity.   PLAN:  To admit the patient.  Start IV heparin, nitroglycerin and  consider cardiac catheterization in the morning with a possible  angioplasty of the left anterior descending coronary artery.      Ricki Rodriguez, M.D.  Electronically Signed     ASK/MEDQ  D:  12/17/2006  T:  12/18/2006  Job:  [id number]

## 2011-01-06 NOTE — Discharge Summary (Signed)
NAMESHAMARIE, [name]             ACCOUNT NO.:  [id number]   MEDICAL RECORD NO.:  [id number]          PATIENT TYPE:  INP   LOCATION:  4735                         FACILITY:  [facility]   PHYSICIAN:  Ricki Rodriguez, M.D.  DATE OF BIRTH:  09-30-1960   DATE OF ADMISSION:  12/17/2006  DATE OF DISCHARGE:  12/18/2006                               DISCHARGE SUMMARY   FINAL DIAGNOSES:  1. Coronary atherosclerosis of native coronary vessels.  2. Angina pectoris.  3. Status post percutaneous transluminal coronary angioplasty.  4. Inferior wall myocardial infarction.  5. Diabetes mellitus.  6. Hypertension.  7. Tobacco use disorder.  8. Schizophrenia.  9. Obesity.  10.Left heart catheterization, selective coronary angiography, and      left ventricular function study done by Dr. Orpah Cobb on December 17, 2006.   DISCHARGE MEDICATIONS:  1. Aspirin 325 mg 1 daily in the morning.  2. Plavix 75 mg 1 daily.  3. Metoprolol 50 mg half twice daily.  4. Pravachol 40 mg 1 daily.  5. Lithium 200 mg 3 times daily.  6. Nicotine patch 40 mg per 24 hour apply daily.  7. Colace 100 mg daily in the evening.  8. Xanax 0.25 mg 1 twice daily as needed.  9. Nitroglycerin 0.4 mg tablet 1 sublingual every 5 minutes x3 as      needed for chest pain.  10.Nitroglycerin 0.2 mg/hr patch applied new one daily off at bedtime.  11.Lisinopril 5 mg 1 daily in the evening.  12.Coumadin 2 mg 1 daily.   DISCHARGE DIET:  Low-sodium, heart-healthy diet.   DISCHARGE ACTIVITY:  The patient to increase activity slowly.   SPECIAL INSTRUCTIONS:  The patient to stop any activity that causes  chest pain, shortness of breath, dizziness, sweating, or excessive  weakness.  The patient is get PT/INR checked in 1 week, 2 weeks, and  then once a month.  Followup by Dr. Orpah Cobb in 1-2 weeks.  The patient to [name] [phone]  for appointment.   WOUND CARE:  The patient to notify of left groin pain, swelling, or   discharge.   CONDITION ON DISCHARGE:  Improved.   HISTORY:  This 50 year old, white female had recurrent pressure type  chest pain radiating to left arm with a known left anterior descending  coronary artery 60-70% stenosis and cardiac risk factors for diabetes,  hypertension, smoking.   PHYSICAL EXAMINATION:  VITAL SIGNS:  Temperature 98.68, respirations 16,  blood pressure 130/60, height 5 feet 4 inches, weight approximately 180  pounds.  HEENT: The patient is normocephalic, atraumatic with pupils equal and  reactive to light.  Extraocular movement intact.  Conjunctivae pink.  Sclerae nonicteric.  NECK:  No JVD.  No carotid bruit.  LUNGS:  Clear bilaterally.  HEART:  Normal S1, S2 without S3 or gallop.  ABDOMEN:  Soft, distended, but nontender.  EXTREMITIES:  No edema, cyanosis, clubbing.  SKIN:  Warm and dry.  CNS: The patient moves all 4 extremities and had bilateral equal grips.   LABORATORY DATA:  Normal hemoglobin and hematocrit.  WBC count slightly  elevated  at 50,400.  Platelet count slightly elevated at 447,000.  INR  1.1.  Electrolytes near normal with potassium of 4.2 and glucose  borderline at 100, BUN 13, creatinine 0.79, albumin borderline at 3.4.  The rest of the liver function test normal.  CK-MB is normal.  Troponin  I slightly high at 1.28.  Subsequent troponin I was 0.64.   EKG revealed normal sinus rhythm with lateral infarct and T-wave  abnormality suggesting inferior ischemia.   Cardiac catheterization showed a moderate left anterior descending  coronary artery disease and patent RCA and PDA stents.   HOSPITAL COURSE:  The patient was admitted to Telemetry Unit.  Myocardial infarction was ruled out.  She underwent cardiac  catheterization that showed patent right coronary artery and PDA stent.  The LAD disease was unchanged, and it was decided to treat the patient  medically.  She was discharged home in satisfactory condition with  followup by me in 2  weeks.      Ricki Rodriguez, M.D.  Electronically Signed     ASK/MEDQ  D:  01/30/2007  T:  01/31/2007  Job:  [id number]

## 2011-01-26 ENCOUNTER — Ambulatory Visit
Admission: RE | Admit: 2011-01-26 | Discharge: 2011-01-26 | Disposition: A | Discharge status: Home / Self Care | Payer: Medicare Other | Source: Ambulatory Visit | Attending: Internal Medicine | Admitting: Internal Medicine

## 2011-01-26 DIAGNOSIS — Z1231 Encounter for screening mammogram for malignant neoplasm of breast: Secondary | ICD-10-CM

## 2011-01-27 ENCOUNTER — Other Ambulatory Visit: Payer: Self-pay | Admitting: Internal Medicine

## 2011-05-11 LAB — POCT PREGNANCY, URINE
Operator id: 148111
Preg Test, Ur: NEGATIVE

## 2011-05-16 LAB — BASIC METABOLIC PANEL
BUN: 11
CO2: 27
Calcium: 9.2
Chloride: 102
Creatinine, Ser: 0.77
GFR calc Af Amer: >60
GFR calc non Af Amer: >60
Glucose, Bld: 114 — ABNORMAL HIGH
Potassium: 4.1
Sodium: 135

## 2011-05-16 LAB — CBC
HCT: 43.8
Hemoglobin: 14.6
MCHC: 33.4
MCV: 93.9
Platelets: 307
RBC: 4.67
RDW: 18.8 — ABNORMAL HIGH
WBC: 16 — ABNORMAL HIGH

## 2011-05-18 LAB — URINALYSIS, ROUTINE W REFLEX MICROSCOPIC
Bilirubin Urine: NEGATIVE
Glucose, UA: NEGATIVE
Ketones, ur: NEGATIVE
Leukocytes, UA: NEGATIVE
Nitrite: NEGATIVE
Protein, ur: NEGATIVE
Specific Gravity, Urine: 1.012
Urobilinogen, UA: 0.2
pH: 6.5

## 2011-05-18 LAB — URINE MICROSCOPIC-ADD ON

## 2011-05-18 LAB — COMPREHENSIVE METABOLIC PANEL
ALT: 17
AST: 18
Albumin: 3.7
Alkaline Phosphatase: 106
BUN: 11
CO2: 25
Calcium: 9.5
Chloride: 108
Creatinine, Ser: 0.81
GFR calc Af Amer: >60
GFR calc non Af Amer: >60
Glucose, Bld: 129 — ABNORMAL HIGH
Potassium: 4.4
Sodium: 140
Total Bilirubin: 0.3
Total Protein: 6.7

## 2011-05-18 LAB — CBC
HCT: 44.1
Hemoglobin: 15.4 — ABNORMAL HIGH
MCHC: 34.8
MCV: 98.3
Platelets: 295
RBC: 4.49
RDW: 15.8 — ABNORMAL HIGH
WBC: 15.1 — ABNORMAL HIGH

## 2011-05-18 LAB — PROTIME-INR
INR: 0.9
Prothrombin Time: 12.6

## 2011-05-18 LAB — APTT: aPTT: 28

## 2011-05-30 LAB — CROSSMATCH
ABO/RH(D): A POS
ABO/RH(D): A POS
ABO/RH(D): A POS
Antibody Screen: NEGATIVE
Antibody Screen: NEGATIVE
Antibody Screen: NEGATIVE

## 2011-05-30 LAB — POCT I-STAT 3, ART BLOOD GAS (G3+)
Acid-base deficit: 10 — ABNORMAL HIGH
Acid-base deficit: 2
Acid-base deficit: 2
Acid-base deficit: 3 — ABNORMAL HIGH
Acid-base deficit: 5 — ABNORMAL HIGH
Acid-base deficit: 5 — ABNORMAL HIGH
Acid-base deficit: 5 — ABNORMAL HIGH
Bicarbonate: 14.7 — ABNORMAL LOW
Bicarbonate: 20.6
Bicarbonate: 20.8
Bicarbonate: 22.4
Bicarbonate: 22.9
Bicarbonate: 24.1 — ABNORMAL HIGH
Bicarbonate: 24.5 — ABNORMAL HIGH
Bicarbonate: 25.1 — ABNORMAL HIGH
O2 Saturation: 100
O2 Saturation: 100
O2 Saturation: 100
O2 Saturation: 92
O2 Saturation: 95
O2 Saturation: 96
O2 Saturation: 97
O2 Saturation: 97
Operator id: 241461
Operator id: 280981
Operator id: 297551
Operator id: 297551
Operator id: 297551
Operator id: 300801
Operator id: 3406
Operator id: 3406
Patient temperature: 36.6
Patient temperature: 36.8
Patient temperature: 37.4
Patient temperature: 37.5
Patient temperature: 37.6
Patient temperature: 37.8
TCO2: 15
TCO2: 22
TCO2: 22
TCO2: 24
TCO2: 24
TCO2: 26
TCO2: 26
TCO2: 26
pCO2 arterial: 28.4 — ABNORMAL LOW
pCO2 arterial: 37.7
pCO2 arterial: 38.5
pCO2 arterial: 39.4
pCO2 arterial: 45.2 — ABNORMAL HIGH
pCO2 arterial: 48.3 — ABNORMAL HIGH
pCO2 arterial: 54 — ABNORMAL HIGH
pCO2 arterial: 56.1 — ABNORMAL HIGH
pH, Arterial: 7.21 — ABNORMAL LOW
pH, Arterial: 7.256 — ABNORMAL LOW
pH, Arterial: 7.314 — ABNORMAL LOW
pH, Arterial: 7.323 — ABNORMAL LOW
pH, Arterial: 7.328 — ABNORMAL LOW
pH, Arterial: 7.348 — ABNORMAL LOW
pH, Arterial: 7.356
pH, Arterial: 7.383
pO2, Arterial: 107 — ABNORMAL HIGH
pO2, Arterial: 187 — ABNORMAL HIGH
pO2, Arterial: 190 — ABNORMAL HIGH
pO2, Arterial: 317 — ABNORMAL HIGH
pO2, Arterial: 77 — ABNORMAL LOW
pO2, Arterial: 81
pO2, Arterial: 85
pO2, Arterial: 95

## 2011-05-30 LAB — CBC
HCT: 23.8 — ABNORMAL LOW
HCT: 25.5 — ABNORMAL LOW
HCT: 25.8 — ABNORMAL LOW
HCT: 26.7 — ABNORMAL LOW
HCT: 27.3 — ABNORMAL LOW
HCT: 29 — ABNORMAL LOW
HCT: 30.5 — ABNORMAL LOW
HCT: 31.2 — ABNORMAL LOW
HCT: 31.7 — ABNORMAL LOW
HCT: 32 — ABNORMAL LOW
HCT: 32.8 — ABNORMAL LOW
HCT: 32.9 — ABNORMAL LOW
HCT: 33.5 — ABNORMAL LOW
HCT: 33.6 — ABNORMAL LOW
HCT: 33.9 — ABNORMAL LOW
HCT: 37.7
HCT: 33 — ABNORMAL LOW
Hemoglobin: 10.1 — ABNORMAL LOW
Hemoglobin: 10.3 — ABNORMAL LOW
Hemoglobin: 10.6 — ABNORMAL LOW
Hemoglobin: 10.7 — ABNORMAL LOW
Hemoglobin: 10.7 — ABNORMAL LOW
Hemoglobin: 10.8 — ABNORMAL LOW
Hemoglobin: 10.9 — ABNORMAL LOW
Hemoglobin: 11.9 — ABNORMAL LOW
Hemoglobin: 7.6 — CL
Hemoglobin: 8.1 — ABNORMAL LOW
Hemoglobin: 8.4 — ABNORMAL LOW
Hemoglobin: 8.7 — ABNORMAL LOW
Hemoglobin: 9 — ABNORMAL LOW
Hemoglobin: 9.4 — ABNORMAL LOW
Hemoglobin: 9.7 — ABNORMAL LOW
Hemoglobin: 9.9 — ABNORMAL LOW
Hemoglobin: 10.6 — ABNORMAL LOW
MCHC: 31.6
MCHC: 31.6
MCHC: 31.7
MCHC: 31.8
MCHC: 31.8
MCHC: 31.9
MCHC: 31.9
MCHC: 32
MCHC: 32.1
MCHC: 32.2
MCHC: 32.3
MCHC: 32.4
MCHC: 32.5
MCHC: 32.6
MCHC: 32.8
MCHC: 32.9
MCHC: 32.2
MCV: 77.7 — ABNORMAL LOW
MCV: 78.3
MCV: 78.3
MCV: 78.6
MCV: 78.7
MCV: 78.8
MCV: 79.2
MCV: 79.4
MCV: 81.2
MCV: 81.3
MCV: 81.4
MCV: 82.2
MCV: 82.3
MCV: 82.4
MCV: 83.2
MCV: 83.6
MCV: 78
Platelets: 176
Platelets: 186
Platelets: 187
Platelets: 202
Platelets: 224
Platelets: 229
Platelets: 257
Platelets: 298
Platelets: 309
Platelets: 317
Platelets: 323
Platelets: 323
Platelets: 323
Platelets: 326
Platelets: 348
Platelets: 374
Platelets: 468 — ABNORMAL HIGH
RBC: 2.89 — ABNORMAL LOW
RBC: 3.1 — ABNORMAL LOW
RBC: 3.17 — ABNORMAL LOW
RBC: 3.19 — ABNORMAL LOW
RBC: 3.32 — ABNORMAL LOW
RBC: 3.57 — ABNORMAL LOW
RBC: 3.75 — ABNORMAL LOW
RBC: 3.95
RBC: 3.98
RBC: 4.03
RBC: 4.07
RBC: 4.21
RBC: 4.26
RBC: 4.27
RBC: 4.32
RBC: 4.77
RBC: 4.23
RDW: 19.8 — ABNORMAL HIGH
RDW: 19.8 — ABNORMAL HIGH
RDW: 20 — ABNORMAL HIGH
RDW: 20 — ABNORMAL HIGH
RDW: 20 — ABNORMAL HIGH
RDW: 20.3 — ABNORMAL HIGH
RDW: 20.4 — ABNORMAL HIGH
RDW: 20.5 — ABNORMAL HIGH
RDW: 20.5 — ABNORMAL HIGH
RDW: 20.6 — ABNORMAL HIGH
RDW: 20.6 — ABNORMAL HIGH
RDW: 20.8 — ABNORMAL HIGH
RDW: 20.8 — ABNORMAL HIGH
RDW: 21.2 — ABNORMAL HIGH
RDW: 21.5 — ABNORMAL HIGH
RDW: 21.7 — ABNORMAL HIGH
RDW: 19.8 — ABNORMAL HIGH
WBC: 11.3 — ABNORMAL HIGH
WBC: 12.1 — ABNORMAL HIGH
WBC: 13.1 — ABNORMAL HIGH
WBC: 13.2 — ABNORMAL HIGH
WBC: 13.4 — ABNORMAL HIGH
WBC: 13.6 — ABNORMAL HIGH
WBC: 15.1 — ABNORMAL HIGH
WBC: 16 — ABNORMAL HIGH
WBC: 17 — ABNORMAL HIGH
WBC: 17.1 — ABNORMAL HIGH
WBC: 19 — ABNORMAL HIGH
WBC: 19.5 — ABNORMAL HIGH
WBC: 21.1 — ABNORMAL HIGH
WBC: 24.7 — ABNORMAL HIGH
WBC: 25.5 — ABNORMAL HIGH
WBC: 33.2 — ABNORMAL HIGH
WBC: 16.7 — ABNORMAL HIGH

## 2011-05-30 LAB — POCT I-STAT 4, (NA,K, GLUC, HGB,HCT)
Glucose, Bld: 101 — ABNORMAL HIGH
Glucose, Bld: 123 — ABNORMAL HIGH
Glucose, Bld: 134 — ABNORMAL HIGH
Glucose, Bld: 146 — ABNORMAL HIGH
Glucose, Bld: 172 — ABNORMAL HIGH
Glucose, Bld: 178 — ABNORMAL HIGH
Glucose, Bld: 205 — ABNORMAL HIGH
HCT: 20 — ABNORMAL LOW
HCT: 21 — ABNORMAL LOW
HCT: 23 — ABNORMAL LOW
HCT: 24 — ABNORMAL LOW
HCT: 31 — ABNORMAL LOW
HCT: 32 — ABNORMAL LOW
HCT: 34 — ABNORMAL LOW
Hemoglobin: 10.5 — ABNORMAL LOW
Hemoglobin: 10.9 — ABNORMAL LOW
Hemoglobin: 11.6 — ABNORMAL LOW
Hemoglobin: 6.8 — CL
Hemoglobin: 7.1 — CL
Hemoglobin: 7.8 — CL
Hemoglobin: 8.2 — ABNORMAL LOW
Operator id: 241461
Operator id: 300801
Operator id: 3406
Operator id: 3406
Operator id: 3406
Operator id: 3406
Operator id: 3406
Potassium: 4
Potassium: 4.3
Potassium: 4.6
Potassium: 4.6
Potassium: 4.8
Potassium: 4.9
Potassium: 5.6 — ABNORMAL HIGH
Sodium: 131 — ABNORMAL LOW
Sodium: 132 — ABNORMAL LOW
Sodium: 134 — ABNORMAL LOW
Sodium: 135
Sodium: 135
Sodium: 136
Sodium: 138

## 2011-05-30 LAB — BLOOD GAS, ARTERIAL
Acid-Base Excess: 0.8
Bicarbonate: 25 — ABNORMAL HIGH
FIO2: 0.21
O2 Saturation: 94.7
Patient temperature: 98.6
TCO2: 26.3
pCO2 arterial: 40.7
pH, Arterial: 7.406 — ABNORMAL HIGH
pO2, Arterial: 76.9 — ABNORMAL LOW

## 2011-05-30 LAB — I-STAT 8, (EC8 V) (CONVERTED LAB)
Acid-Base Excess: 1
Acid-base deficit: 1
BUN: 14
BUN: 18
Bicarbonate: 24.1 — ABNORMAL HIGH
Bicarbonate: 25.3 — ABNORMAL HIGH
Chloride: 100
Chloride: 105
Glucose, Bld: 136 — ABNORMAL HIGH
Glucose, Bld: 111 — ABNORMAL HIGH
HCT: 27 — ABNORMAL LOW
HCT: 36
Hemoglobin: 9.2 — ABNORMAL LOW
Hemoglobin: 12.2
Operator id: 274841
Operator id: 294341
Potassium: 4.1
Potassium: 4
Sodium: 134 — ABNORMAL LOW
Sodium: 134 — ABNORMAL LOW
TCO2: 25
TCO2: 26
pCO2, Ven: 43 — ABNORMAL LOW
pCO2, Ven: 38.7 — ABNORMAL LOW
pH, Ven: 7.357 — ABNORMAL HIGH
pH, Ven: 7.424 — ABNORMAL HIGH

## 2011-05-30 LAB — POCT I-STAT 7, (LYTES, BLD GAS, ICA,H+H)
Acid-base deficit: 7 — ABNORMAL HIGH
Bicarbonate: 17.7 — ABNORMAL LOW
Calcium, Ion: 1 — ABNORMAL LOW
HCT: 23 — ABNORMAL LOW
Hemoglobin: 7.8 — CL
O2 Saturation: 100
Operator id: 280981
Potassium: 3.9
Sodium: 142
TCO2: 19
pCO2 arterial: 33 — ABNORMAL LOW
pH, Arterial: 7.339 — ABNORMAL LOW
pO2, Arterial: 178 — ABNORMAL HIGH

## 2011-05-30 LAB — PREPARE PLATELET PHERESIS

## 2011-05-30 LAB — DIFFERENTIAL
Basophils Absolute: 0.1
Basophils Absolute: 0.1
Basophils Relative: 1
Basophils Relative: 1
Eosinophils Absolute: 0.6
Eosinophils Absolute: 0.4
Eosinophils Relative: 4
Eosinophils Relative: 3
Lymphocytes Relative: 25
Lymphocytes Relative: 30
Lymphs Abs: 4
Lymphs Abs: 5.1 — ABNORMAL HIGH
Monocytes Absolute: 1.1 — ABNORMAL HIGH
Monocytes Absolute: 1.3 — ABNORMAL HIGH
Monocytes Relative: 7
Monocytes Relative: 8
Neutro Abs: 10.2 — ABNORMAL HIGH
Neutro Abs: 9.7 — ABNORMAL HIGH
Neutrophils Relative %: 64
Neutrophils Relative %: 58

## 2011-05-30 LAB — URINE CULTURE: Colony Count: 50000

## 2011-05-30 LAB — BASIC METABOLIC PANEL
BUN: 11
BUN: 11
BUN: 12
BUN: 15
BUN: 16
BUN: 17
BUN: 17
BUN: 9
CO2: 24
CO2: 24
CO2: 25
CO2: 25
CO2: 26
CO2: 26
CO2: 30
CO2: 33 — ABNORMAL HIGH
Calcium: 7.2 — ABNORMAL LOW
Calcium: 7.8 — ABNORMAL LOW
Calcium: 8.2 — ABNORMAL LOW
Calcium: 8.6
Calcium: 9
Calcium: 9.1
Calcium: 9.2
Calcium: 9.3
Chloride: 101
Chloride: 102
Chloride: 103
Chloride: 104
Chloride: 105
Chloride: 105
Chloride: 107
Chloride: 97
Creatinine, Ser: 0.73
Creatinine, Ser: 0.82
Creatinine, Ser: 0.85
Creatinine, Ser: 0.85
Creatinine, Ser: 0.85
Creatinine, Ser: 0.89
Creatinine, Ser: 0.92
Creatinine, Ser: 0.93
GFR calc Af Amer: >60
GFR calc Af Amer: >60
GFR calc Af Amer: >60
GFR calc Af Amer: >60
GFR calc Af Amer: >60
GFR calc Af Amer: >60
GFR calc Af Amer: >60
GFR calc Af Amer: >60
GFR calc non Af Amer: >60
GFR calc non Af Amer: >60
GFR calc non Af Amer: >60
GFR calc non Af Amer: >60
GFR calc non Af Amer: >60
GFR calc non Af Amer: >60
GFR calc non Af Amer: >60
GFR calc non Af Amer: >60
Glucose, Bld: 103 — ABNORMAL HIGH
Glucose, Bld: 103 — ABNORMAL HIGH
Glucose, Bld: 108 — ABNORMAL HIGH
Glucose, Bld: 110 — ABNORMAL HIGH
Glucose, Bld: 117 — ABNORMAL HIGH
Glucose, Bld: 132 — ABNORMAL HIGH
Glucose, Bld: 144 — ABNORMAL HIGH
Glucose, Bld: 96
Potassium: 3.9
Potassium: 3.9
Potassium: 4
Potassium: 4.2
Potassium: 4.2
Potassium: 4.3
Potassium: 4.6
Potassium: 4.8
Sodium: 133 — ABNORMAL LOW
Sodium: 134 — ABNORMAL LOW
Sodium: 137
Sodium: 137
Sodium: 137
Sodium: 137
Sodium: 138
Sodium: 139

## 2011-05-30 LAB — COMPREHENSIVE METABOLIC PANEL
ALT: 13
AST: 20
Albumin: 3.4 — ABNORMAL LOW
Alkaline Phosphatase: 105
BUN: 12
CO2: 24
Calcium: 8.9
Chloride: 101
Creatinine, Ser: 0.79
GFR calc Af Amer: >60
GFR calc non Af Amer: >60
Glucose, Bld: 92
Potassium: 4.4
Sodium: 134 — ABNORMAL LOW
Total Bilirubin: 0.8
Total Protein: 6.5

## 2011-05-30 LAB — HEMOGLOBIN AND HEMATOCRIT, BLOOD
HCT: 23.3 — ABNORMAL LOW
Hemoglobin: 7.6 — CL

## 2011-05-30 LAB — I-STAT EC8
Acid-base deficit: 3 — ABNORMAL HIGH
BUN: 12
Bicarbonate: 23.1
Chloride: 106
Glucose, Bld: 142 — ABNORMAL HIGH
HCT: 29 — ABNORMAL LOW
Hemoglobin: 9.9 — ABNORMAL LOW
Operator id: 297551
Potassium: 4.8
Sodium: 138
TCO2: 24
pCO2 arterial: 43.5
pH, Arterial: 7.334 — ABNORMAL LOW

## 2011-05-30 LAB — LIPID PANEL
Cholesterol: 196
HDL: 36 — ABNORMAL LOW
LDL Cholesterol: 115 — ABNORMAL HIGH
Total CHOL/HDL Ratio: 5.4
Triglycerides: 226 — ABNORMAL HIGH
VLDL: 45 — ABNORMAL HIGH

## 2011-05-30 LAB — URINALYSIS, ROUTINE W REFLEX MICROSCOPIC
Bilirubin Urine: NEGATIVE
Glucose, UA: NEGATIVE
Ketones, ur: NEGATIVE
Leukocytes, UA: NEGATIVE
Nitrite: NEGATIVE
Protein, ur: NEGATIVE
Specific Gravity, Urine: 1.005
Urobilinogen, UA: 0.2
pH: 7.5

## 2011-05-30 LAB — POCT CARDIAC MARKERS
CKMB, poc: <1 — ABNORMAL LOW
CKMB, poc: <1 — ABNORMAL LOW
Myoglobin, poc: 47.6
Myoglobin, poc: 48.7
Operator id: 272551
Operator id: 294341
Troponin i, poc: <0.05
Troponin i, poc: <0.05

## 2011-05-30 LAB — CREATININE, SERUM
Creatinine, Ser: 0.68
Creatinine, Ser: 0.89
GFR calc Af Amer: >60
GFR calc Af Amer: >60
GFR calc non Af Amer: >60
GFR calc non Af Amer: >60

## 2011-05-30 LAB — CULTURE, RESPIRATORY W GRAM STAIN: Gram Stain: NONE SEEN

## 2011-05-30 LAB — URINE MICROSCOPIC-ADD ON

## 2011-05-30 LAB — PROTIME-INR
INR: 0.9
INR: 1
INR: 1.3
INR: 0.9
Prothrombin Time: 12.1
Prothrombin Time: 13.1
Prothrombin Time: 16.7 — ABNORMAL HIGH
Prothrombin Time: 12.7

## 2011-05-30 LAB — RETICULOCYTES
RBC.: 4.37
Retic Count, Absolute: 74.3
Retic Ct Pct: 1.7

## 2011-05-30 LAB — APTT
aPTT: 29
aPTT: 32

## 2011-05-30 LAB — HEMOGLOBIN A1C
Hgb A1c MFr Bld: 5.8
Hgb A1c MFr Bld: 5.8
Mean Plasma Glucose: 129
Mean Plasma Glucose: 129

## 2011-05-30 LAB — MAGNESIUM
Magnesium: 2.2
Magnesium: 2.4
Magnesium: 2.5

## 2011-05-30 LAB — PLATELET COUNT: Platelets: 207

## 2011-05-30 LAB — IRON AND TIBC
Iron: 63
Saturation Ratios: 15 — ABNORMAL LOW
TIBC: 418
UIBC: 355

## 2011-05-30 LAB — D-DIMER, QUANTITATIVE: D-Dimer, Quant: 0.7 — ABNORMAL HIGH

## 2011-05-30 LAB — POCT I-STAT GLUCOSE
Glucose, Bld: 123 — ABNORMAL HIGH
Glucose, Bld: 226 — ABNORMAL HIGH
Glucose, Bld: 229 — ABNORMAL HIGH
Operator id: 300801
Operator id: 3406
Operator id: 3406

## 2011-05-30 LAB — FOLATE RBC: RBC Folate: 765 — ABNORMAL HIGH

## 2011-05-30 LAB — ABO/RH: ABO/RH(D): A POS

## 2011-05-30 LAB — POCT I-STAT CREATININE
Creatinine, Ser: 0.8
Operator id: 294341

## 2011-05-30 LAB — FERRITIN: Ferritin: 11 (ref 10–291)

## 2011-05-30 LAB — VITAMIN B12: Vitamin B-12: 833 (ref 211–911)

## 2011-11-18 ENCOUNTER — Emergency Department (HOSPITAL_COMMUNITY): Payer: Medicare Other

## 2011-11-18 ENCOUNTER — Encounter (HOSPITAL_COMMUNITY): Payer: Self-pay | Admitting: *Deleted

## 2011-11-18 ENCOUNTER — Other Ambulatory Visit: Payer: Self-pay

## 2011-11-18 ENCOUNTER — Emergency Department (HOSPITAL_COMMUNITY)
Admission: EM | Admit: 2011-11-18 | Discharge: 2011-11-18 | Disposition: A | Discharge status: Home Health | Payer: Medicare Other | Attending: Emergency Medicine | Admitting: Emergency Medicine

## 2011-11-18 DIAGNOSIS — Z9889 Other specified postprocedural states: Secondary | ICD-10-CM | POA: Insufficient documentation

## 2011-11-18 DIAGNOSIS — R110 Nausea: Secondary | ICD-10-CM | POA: Insufficient documentation

## 2011-11-18 DIAGNOSIS — J4489 Other specified chronic obstructive pulmonary disease: Secondary | ICD-10-CM | POA: Insufficient documentation

## 2011-11-18 DIAGNOSIS — R05 Cough: Secondary | ICD-10-CM | POA: Insufficient documentation

## 2011-11-18 DIAGNOSIS — I252 Old myocardial infarction: Secondary | ICD-10-CM | POA: Insufficient documentation

## 2011-11-18 DIAGNOSIS — F17200 Nicotine dependence, unspecified, uncomplicated: Secondary | ICD-10-CM | POA: Insufficient documentation

## 2011-11-18 DIAGNOSIS — I10 Essential (primary) hypertension: Secondary | ICD-10-CM | POA: Insufficient documentation

## 2011-11-18 DIAGNOSIS — N760 Acute vaginitis: Secondary | ICD-10-CM | POA: Insufficient documentation

## 2011-11-18 DIAGNOSIS — B9689 Other specified bacterial agents as the cause of diseases classified elsewhere: Secondary | ICD-10-CM | POA: Insufficient documentation

## 2011-11-18 DIAGNOSIS — E789 Disorder of lipoprotein metabolism, unspecified: Secondary | ICD-10-CM | POA: Insufficient documentation

## 2011-11-18 DIAGNOSIS — R059 Cough, unspecified: Secondary | ICD-10-CM | POA: Insufficient documentation

## 2011-11-18 DIAGNOSIS — J449 Chronic obstructive pulmonary disease, unspecified: Secondary | ICD-10-CM | POA: Insufficient documentation

## 2011-11-18 DIAGNOSIS — A499 Bacterial infection, unspecified: Secondary | ICD-10-CM | POA: Insufficient documentation

## 2011-11-18 DIAGNOSIS — F319 Bipolar disorder, unspecified: Secondary | ICD-10-CM | POA: Insufficient documentation

## 2011-11-18 DIAGNOSIS — R109 Unspecified abdominal pain: Secondary | ICD-10-CM | POA: Insufficient documentation

## 2011-11-18 DIAGNOSIS — N926 Irregular menstruation, unspecified: Secondary | ICD-10-CM | POA: Insufficient documentation

## 2011-11-18 DIAGNOSIS — N939 Abnormal uterine and vaginal bleeding, unspecified: Secondary | ICD-10-CM | POA: Insufficient documentation

## 2011-11-18 DIAGNOSIS — I2510 Atherosclerotic heart disease of native coronary artery without angina pectoris: Secondary | ICD-10-CM | POA: Insufficient documentation

## 2011-11-18 DIAGNOSIS — M129 Arthropathy, unspecified: Secondary | ICD-10-CM | POA: Insufficient documentation

## 2011-11-18 HISTORY — DX: Pure hypercholesterolemia, unspecified: E78.00

## 2011-11-18 HISTORY — DX: Chronic obstructive pulmonary disease, unspecified: J44.9

## 2011-11-18 HISTORY — DX: Atherosclerotic heart disease of native coronary artery without angina pectoris: I25.10

## 2011-11-18 HISTORY — DX: Unspecified osteoarthritis, unspecified site: M19.90

## 2011-11-18 HISTORY — DX: Bipolar disorder, unspecified: F31.9

## 2011-11-18 HISTORY — DX: Essential (primary) hypertension: I10

## 2011-11-18 LAB — COMPREHENSIVE METABOLIC PANEL
ALT: 20 U/L (ref 0–35)
AST: 20 U/L (ref 0–37)
Albumin: 4.3 g/dL (ref 3.5–5.2)
Alkaline Phosphatase: 143 U/L — ABNORMAL HIGH (ref 39–117)
BUN: 9 mg/dL (ref 6–23)
CO2: 24 mEq/L (ref 19–32)
Calcium: 10.1 mg/dL (ref 8.4–10.5)
Chloride: 101 mEq/L (ref 96–112)
Creatinine, Ser: 0.78 mg/dL (ref 0.50–1.10)
GFR calc Af Amer: >90 mL/min (ref >90)
GFR calc non Af Amer: >90 mL/min (ref >90)
Glucose, Bld: 131 mg/dL — ABNORMAL HIGH (ref 70–99)
Potassium: 4.7 mEq/L (ref 3.5–5.1)
Sodium: 135 mEq/L (ref 135–145)
Total Bilirubin: 0.5 mg/dL (ref 0.3–1.2)
Total Protein: 7.5 g/dL (ref 6.0–8.3)

## 2011-11-18 LAB — DIFFERENTIAL
Basophils Absolute: 0.1 10*3/uL (ref 0.0–0.1)
Basophils Relative: 0 % (ref 0–1)
Eosinophils Absolute: 0.2 10*3/uL (ref 0.0–0.7)
Eosinophils Relative: 1 % (ref 0–5)
Lymphocytes Relative: 28 % (ref 12–46)
Lymphs Abs: 4.2 10*3/uL — ABNORMAL HIGH (ref 0.7–4.0)
Monocytes Absolute: 1.3 10*3/uL — ABNORMAL HIGH (ref 0.1–1.0)
Monocytes Relative: 9 % (ref 3–12)
Neutro Abs: 9.1 10*3/uL — ABNORMAL HIGH (ref 1.7–7.7)
Neutrophils Relative %: 61 % (ref 43–77)

## 2011-11-18 LAB — CBC
HCT: 47.3 % — ABNORMAL HIGH (ref 36.0–46.0)
Hemoglobin: 16.6 g/dL — ABNORMAL HIGH (ref 12.0–15.0)
MCH: 34.3 pg — ABNORMAL HIGH (ref 26.0–34.0)
MCHC: 35.1 g/dL (ref 30.0–36.0)
MCV: 97.7 fL (ref 78.0–100.0)
Platelets: 265 10*3/uL (ref 150–400)
RBC: 4.84 MIL/uL (ref 3.87–5.11)
RDW: 13.7 % (ref 11.5–15.5)
WBC: 14.9 10*3/uL — ABNORMAL HIGH (ref 4.0–10.5)

## 2011-11-18 LAB — WET PREP, GENITAL
Trich, Wet Prep: NONE SEEN
Yeast Wet Prep HPF POC: NONE SEEN

## 2011-11-18 LAB — URINALYSIS, ROUTINE W REFLEX MICROSCOPIC
Glucose, UA: NEGATIVE mg/dL
Leukocytes, UA: NEGATIVE
Nitrite: NEGATIVE
Protein, ur: 30 mg/dL — AB
Specific Gravity, Urine: 1.022 (ref 1.005–1.030)
Urobilinogen, UA: 0.2 mg/dL (ref 0.0–1.0)
pH: 6 (ref 5.0–8.0)

## 2011-11-18 LAB — URINE MICROSCOPIC-ADD ON

## 2011-11-18 LAB — PREGNANCY, URINE: Preg Test, Ur: NEGATIVE

## 2011-11-18 MED ORDER — METRONIDAZOLE 500 MG PO TABS: 500.0000 mg | ORAL_TABLET | Freq: Two times a day (BID) | ORAL | Status: AC

## 2011-11-18 NOTE — ED Notes (Signed)
Pt is former drug addict, not interested in pain meds, just wants to know cause of pain.

## 2011-11-18 NOTE — Discharge Instructions (Signed)
Bacterial Vaginosis Bacterial vaginosis (BV) is a vaginal infection where the normal balance of bacteria in the vagina is disrupted. The normal balance is then replaced by an overgrowth of certain bacteria. There are several different kinds of bacteria that can cause BV. BV is the most common vaginal infection in women of childbearing age. CAUSES   The cause of BV is not fully understood. BV develops when there is an increase or imbalance of harmful bacteria.   Some activities or behaviors can upset the normal balance of bacteria in the vagina and put women at increased risk including:   Having a new sex partner or multiple sex partners.   Douching.   Using an intrauterine device (IUD) for contraception.   It is not clear what role sexual activity plays in the development of BV. However, women that have never had sexual intercourse are rarely infected with BV.  Women do not get BV from toilet seats, bedding, swimming pools or from touching objects around them.  SYMPTOMS   Grey vaginal discharge.   A fish-like odor with discharge, especially after sexual intercourse.   Itching or burning of the vagina and vulva.   Burning or pain with urination.   Some women have no signs or symptoms at all.  DIAGNOSIS  Your caregiver must examine the vagina for signs of BV. Your caregiver will perform lab tests and look at the sample of vaginal fluid through a microscope. They will look for bacteria and abnormal cells (clue cells), a pH test higher than 4.5, and a positive amine test all associated with BV.  RISKS AND COMPLICATIONS   Pelvic inflammatory disease (PID).   Infections following gynecology surgery.   Developing HIV.   Developing herpes virus.  TREATMENT  Sometimes BV will clear up without treatment. However, all women with symptoms of BV should be treated to avoid complications, especially if gynecology surgery is planned. Female partners generally do not need to be treated. However,  BV may spread between female sex partners so treatment is helpful in preventing a recurrence of BV.   BV may be treated with antibiotics. The antibiotics come in either pill or vaginal cream forms. Either can be used with nonpregnant or pregnant women, but the recommended dosages differ. These antibiotics are not harmful to the baby.   BV can recur after treatment. If this happens, a second round of antibiotics will often be prescribed.   Treatment is important for pregnant women. If not treated, BV can cause a premature delivery, especially for a pregnant woman who had a premature birth in the past. All pregnant women who have symptoms of BV should be checked and treated.   For chronic reoccurrence of BV, treatment with a type of prescribed gel vaginally twice a week is helpful.  HOME CARE INSTRUCTIONS   Finish all medication as directed by your caregiver.   Do not have sex until treatment is completed.   Tell your sexual partner that you have a vaginal infection. They should see their caregiver and be treated if they have problems, such as a mild rash or itching.   Practice safe sex. Use condoms. Only have 1 sex partner.  PREVENTION  Basic prevention steps can help reduce the risk of upsetting the natural balance of bacteria in the vagina and developing BV:  Do not have sexual intercourse (be abstinent).   Do not douche.   Use all of the medicine prescribed for treatment of BV, even if the signs and symptoms go away.     Tell your sex partner if you have BV. That way, they can be treated, if needed, to prevent reoccurrence.  SEEK MEDICAL CARE IF:   Your symptoms are not improving after 3 days of treatment.   You have increased discharge, pain, or fever.  MAKE SURE YOU:   Understand these instructions.   Will watch your condition.   Will get help right away if you are not doing well or get worse.  FOR MORE INFORMATION  Division of STD Prevention (DSTDP), Centers for Disease  Control and Prevention: www.cdc.gov/std American [employer] Association (ASHA): www.ashastd.org  Document Released: 08/07/2005 Document Revised: 07/27/2011 Document Reviewed: 01/28/2009 [employer] Patient Information 2012 [employer]. 

## 2011-11-18 NOTE — ED Notes (Signed)
Pt from home with reports left side pain that started yesterday evening. Pt denies injury, nausea, vomiting, dysuria or fever.

## 2011-11-18 NOTE — ED Notes (Signed)
Urine specimen obtained and sent to the lab for holding.

## 2011-11-18 NOTE — ED Notes (Signed)
Pt w/ long cardiac hx and on numerous cardiac meds which she has been allowed to take since arrival

## 2011-11-18 NOTE — ED Notes (Signed)
Pt set up and ready for pelvic exam.

## 2011-11-18 NOTE — ED Provider Notes (Signed)
History     CSN: [id number]  Arrival date & time 11/18/11  4782   First MD Initiated Contact with Patient 11/18/11 1132      Chief Complaint  Patient presents with  . Flank Pain    Left side pain  . Nausea    (Consider location/radiation/quality/duration/timing/severity/associated sxs/prior treatment) HPI Comments: Patient is a 51 year old woman who says she had a heavy period yesterday. After this she had onset of the left flank pain has been constant. He has no prior history of urinary infections or kidney stone. However she does have a history of family members having kidney stones and also renal cancer. She therefore sought evaluation. She has multiple comorbidities including coronary artery disease with prior coronary artery bypass grafting, diabetes, hypertension, glaucoma, high cholesterol, COPD, and bipolar disorder. She continues to smoke.  Patient is a 51 y.o. female presenting with flank pain. The history is provided by the patient and medical records. No language interpreter was used.  Flank Pain This is a new problem. The current episode started yesterday. The problem occurs constantly. The problem has not changed since onset.Exacerbated by: Nothing. She has tried nothing for the symptoms.    Past Medical History  Diagnosis Date  . Coronary artery disease   . MI (myocardial infarction)   . Borderline diabetic   . Hypertension   . High cholesterol   . COPD (chronic obstructive pulmonary disease)   . Arthritis   . Asthma   . Bipolar 1 disorder     Past Surgical History  Procedure Date  . Cardiac surgery   . Cardiac stents     History reviewed. No pertinent family history.  History  Substance Use Topics  . Smoking status: Current Everyday Smoker -- 0.5 packs/day    Types: Cigarettes  . Smokeless tobacco: Never Used  . Alcohol Use: No    OB History    Grav Para Term Preterm Abortions TAB SAB Ect Mult Living                  Review of Systems    Constitutional: Negative.  Negative for fever.  HENT: Negative.   Eyes: Negative.   Respiratory: Positive for cough.   Cardiovascular: Negative.   Gastrointestinal: Negative.   Genitourinary: Positive for flank pain and menstrual problem.  Musculoskeletal: Negative.   Neurological: Negative.   Psychiatric/Behavioral: Negative.     Allergies  Haldol and Amoxicillin  Home Medications   Current Outpatient Rx  Name Route Sig Dispense Refill  . ATORVASTATIN CALCIUM 80 MG PO TABS Oral Take 80 mg by mouth daily.    Marland Kitchen BRIMONIDINE TARTRATE 0.15 % OP SOLN Both Eyes Place 1 drop into both eyes 2 (two) times daily.    . BUPROPION HCL ER (XL) 150 MG PO TB24 Oral Take 150 mg by mouth daily.    Marland Kitchen CLONAZEPAM 0.5 MG PO TABS Oral Take 0.5 mg by mouth at bedtime as needed.    . CLOPIDOGREL BISULFATE 75 MG PO TABS Oral Take 75 mg by mouth daily.    Marland Kitchen FERROUS SULFATE 325 (65 FE) MG PO TABS Oral Take 325 mg by mouth daily with breakfast.    . ISOSORBIDE DINITRATE 30 MG PO TABS Oral Take 30 mg by mouth 2 (two) times daily.    Marland Kitchen LAMOTRIGINE 100 MG PO TABS Oral Take 100 mg by mouth daily.    Marland Kitchen LISINOPRIL 20 MG PO TABS Oral Take 20 mg by mouth daily.    Marland Kitchen METOPROLOL  TARTRATE 25 MG PO TABS Oral Take 25 mg by mouth 2 (two) times daily.    Marland Kitchen NAPROXEN SODIUM 220 MG PO TABS Oral Take 220 mg by mouth 2 (two) times daily with a meal.    . NIACIN ER (ANTIHYPERLIPIDEMIC) 1000 MG PO TBCR Oral Take 1,000 mg by mouth at bedtime.    Marland Kitchen NITROGLYCERIN 0.4 MG SL SUBL Sublingual Place 0.4 mg under the tongue every 5 (five) minutes as needed.    . OMEGA-3-ACID ETHYL ESTERS 1 G PO CAPS Oral Take 1 g by mouth 2 (two) times daily.    Marland Kitchen PANTOPRAZOLE SODIUM 40 MG PO TBEC Oral Take 40 mg by mouth daily.    Marland Kitchen VITAMIN B-12 100 MCG PO TABS Oral Take 50 mcg by mouth daily.    Marland Kitchen VITAMIN C 500 MG PO TABS Oral Take 500 mg by mouth daily.      BP 121/91  Pulse 61  Temp(Src) 98.2 F (36.8 C) (Oral)  Resp 24  Ht 5\' 5"  (1.651 m)   Wt 200 lb (90.719 kg)  BMI 33.28 kg/m2  SpO2 99%  LMP 11/16/2011  Physical Exam  Nursing note and vitals reviewed. Constitutional: She is oriented to person, place, and time. She appears well-developed and well-nourished.       In mild distress with left flank pain.  HENT:  Head: Normocephalic and atraumatic.  Right Ear: External ear normal.  Left Ear: External ear normal.  Mouth/Throat: Oropharynx is clear and moist.  Eyes: Conjunctivae and EOM are normal. Pupils are equal, round, and reactive to light.  Neck: Normal range of motion.  Cardiovascular: Normal rate, regular rhythm and normal heart sounds.   Pulmonary/Chest: Effort normal and breath sounds normal.  Abdominal: Soft. Bowel sounds are normal.  Musculoskeletal: Normal range of motion. She exhibits no edema.  Neurological: She is alert and oriented to person, place, and time.       No sensory or motor deficit.  Skin: Skin is warm and dry.  Psychiatric: She has a normal mood and affect. Her behavior is normal.    ED Course  Procedures (including critical care time)   Labs Reviewed  URINALYSIS, ROUTINE W REFLEX MICROSCOPIC  CBC  DIFFERENTIAL  COMPREHENSIVE METABOLIC PANEL  URINALYSIS, ROUTINE W REFLEX MICROSCOPIC  URINE CULTURE  PREGNANCY, URINE  WET PREP, GENITAL  GC/CHLAMYDIA PROBE AMP, GENITAL   11:48 AM Pt seen --> physical exam performed.  Lab workup ordered.  Pt did not wish to take pain medicine at this time.   Date: 11/18/2011  Rate:50  Rhythm: normal sinus rhythm  QRS Axis: normal  Intervals: PR prolonged QRS:  Q waves in inferior leads suggests old inferior myocardial infarction.   ST/T Wave abnormalities: normal  Conduction Disutrbances:  Incomplete right bundle branch block.  Narrative Interpretation: Abnormal EKG.  Old EKG Reviewed: unchanged 3:03 PM Results for orders placed during the hospital encounter of 11/18/11  URINALYSIS, ROUTINE W REFLEX MICROSCOPIC      Component Value Range    Color, Urine YELLOW  YELLOW    APPearance CLOUDY (*) CLEAR    Specific Gravity, Urine 1.022  1.005 - 1.030    pH 6.0  5.0 - 8.0    Glucose, UA NEGATIVE  NEGATIVE (mg/dL)   Hgb urine dipstick LARGE (*) NEGATIVE    Bilirubin Urine SMALL (*) NEGATIVE    Ketones, ur TRACE (*) NEGATIVE (mg/dL)   Protein, ur 30 (*) NEGATIVE (mg/dL)   Urobilinogen, UA 0.2  0.0 - 1.0 (  mg/dL)   Nitrite NEGATIVE  NEGATIVE    Leukocytes, UA NEGATIVE  NEGATIVE   CBC      Component Value Range   WBC 14.9 (*) 4.0 - 10.5 (K/uL)   RBC 4.84  3.87 - 5.11 (MIL/uL)   Hemoglobin 16.6 (*) 12.0 - 15.0 (g/dL)   HCT 04.5 (*) 40.9 - 46.0 (%)   MCV 97.7  78.0 - 100.0 (fL)   MCH 34.3 (*) 26.0 - 34.0 (pg)   MCHC 35.1  30.0 - 36.0 (g/dL)   RDW 81.1  91.4 - 78.2 (%)   Platelets 265  150 - 400 (K/uL)  DIFFERENTIAL      Component Value Range   Neutrophils Relative 61  43 - 77 (%)   Neutro Abs 9.1 (*) 1.7 - 7.7 (K/uL)   Lymphocytes Relative 28  12 - 46 (%)   Lymphs Abs 4.2 (*) 0.7 - 4.0 (K/uL)   Monocytes Relative 9  3 - 12 (%)   Monocytes Absolute 1.3 (*) 0.1 - 1.0 (K/uL)   Eosinophils Relative 1  0 - 5 (%)   Eosinophils Absolute 0.2  0.0 - 0.7 (K/uL)   Basophils Relative 0  0 - 1 (%)   Basophils Absolute 0.1  0.0 - 0.1 (K/uL)  COMPREHENSIVE METABOLIC PANEL      Component Value Range   Sodium 135  135 - 145 (mEq/L)   Potassium 4.7  3.5 - 5.1 (mEq/L)   Chloride 101  96 - 112 (mEq/L)   CO2 24  19 - 32 (mEq/L)   Glucose, Bld 131 (*) 70 - 99 (mg/dL)   BUN 9  6 - 23 (mg/dL)   Creatinine, Ser 9.56  0.50 - 1.10 (mg/dL)   Calcium 21.3  8.4 - 10.5 (mg/dL)   Total Protein 7.5  6.0 - 8.3 (g/dL)   Albumin 4.3  3.5 - 5.2 (g/dL)   AST 20  0 - 37 (U/L)   ALT 20  0 - 35 (U/L)   Alkaline Phosphatase 143 (*) 39 - 117 (U/L)   Total Bilirubin 0.5  0.3 - 1.2 (mg/dL)   GFR calc non Af Amer >90  >90 (mL/min)   GFR calc Af Amer >90  >90 (mL/min)  URINE MICROSCOPIC-ADD ON      Component Value Range   Squamous Epithelial / LPF FEW (*)  RARE    RBC / HPF 3-6  <3 (RBC/hpf)   Bacteria, UA RARE  RARE    Urine-Other MUCOUS PRESENT     Ct Abdomen Pelvis Wo Contrast  11/18/2011  *RADIOLOGY REPORT*  Clinical Data: Left-sided flank pain.  Heavy menses.  CT ABDOMEN AND PELVIS WITHOUT CONTRAST  Technique:  Multidetector CT imaging of the abdomen and pelvis was performed following the standard protocol without intravenous contrast.  Comparison: No priors.  Findings:  Lung Bases: Atherosclerosis in the right coronary artery.  Distal right coronary artery stents.  Abdomen/Pelvis:  There are no definite calcifications within the collecting system of either kidney, or along the course of either ureter to suggest presence of abnormal urinary tract calculi.  No hydroureteronephrosis or perinephric stranding to suggest urinary tract obstruction at this time.  Urinary bladder is nearly completely decompressed, but is otherwise unremarkable in appearance.  Numerous phleboliths are noted within the pelvis.  The unenhanced appearance of the liver, gallbladder, pancreas, spleen and bilateral adrenal glands is unremarkable.  In the anterior aspect of the interpolar region of the left kidney there is a subcentimeter high attenuation (80 HU) lesion  which is likely to represent a small hemorrhagic or proteinaceous cyst (although this is incompletely characterized).  There is no ascites or pneumoperitoneum and no pathologic distension of bowel.  No definite pathologic adenopathy noted within the abdomen or pelvis.  Atherosclerosis throughout the abdominal and pelvic vasculature, without evidence of focal aneurysm.  Small amount of fluid in the endometrial canal.  The unenhanced appearance of the uterus and bilateral ovaries is otherwise unremarkable.  Musculoskeletal: Median sternotomy wires incompletely visualized. There are no aggressive appearing lytic or blastic lesions noted in the visualized portions of the skeleton.  IMPRESSION: 1.  No abnormal urinary tract  calculi are identified.  No signs of urinary tract obstruction. 2.  No acute findings within the abdomen or pelvis to account for patient's symptoms of left-sided flank pain. 3.  Sub centimeter high attenuation (80 HU) lesion in the anterior aspect of the interpolar region of the left kidney is statistically likely to represent a small proteinaceous or hemorrhagic cyst, however, this is not technically characterized on today's noncontrast CT examination. 4.  Atherosclerosis.  Original Report Authenticated By: Florencia Reasons, M.D.   3:47 PM Wet prep shows clue cells.  Will treat with metronidazole 500 mg bid x 7 days.   1. Bacterial vaginosis            Carleene Cooper III, MD 11/19/11 0630

## 2011-11-19 LAB — URINE CULTURE
Colony Count: NO GROWTH
Culture  Setup Time: 201303301704
Culture: NO GROWTH

## 2011-11-20 LAB — GC/CHLAMYDIA PROBE AMP, GENITAL
Chlamydia, DNA Probe: NEGATIVE
GC Probe Amp, Genital: NEGATIVE

## 2012-01-02 ENCOUNTER — Other Ambulatory Visit: Payer: Self-pay | Admitting: Internal Medicine

## 2012-01-02 ENCOUNTER — Other Ambulatory Visit: Payer: Self-pay | Admitting: Family Medicine

## 2012-01-02 DIAGNOSIS — Z1231 Encounter for screening mammogram for malignant neoplasm of breast: Secondary | ICD-10-CM

## 2012-01-29 ENCOUNTER — Ambulatory Visit
Admission: RE | Admit: 2012-01-29 | Discharge: 2012-01-29 | Disposition: A | Discharge status: Home / Self Care | Payer: Medicare Other | Source: Ambulatory Visit | Attending: Family Medicine | Admitting: Family Medicine

## 2012-01-29 DIAGNOSIS — Z1231 Encounter for screening mammogram for malignant neoplasm of breast: Secondary | ICD-10-CM

## 2012-02-28 ENCOUNTER — Ambulatory Visit: Payer: Medicare Other | Attending: Family Medicine | Admitting: Rehabilitative and Restorative Service Providers"

## 2012-02-28 DIAGNOSIS — IMO0001 Reserved for inherently not codable concepts without codable children: Secondary | ICD-10-CM | POA: Insufficient documentation

## 2012-02-28 DIAGNOSIS — M546 Pain in thoracic spine: Secondary | ICD-10-CM | POA: Insufficient documentation

## 2012-02-28 DIAGNOSIS — M542 Cervicalgia: Secondary | ICD-10-CM | POA: Insufficient documentation

## 2012-03-12 ENCOUNTER — Ambulatory Visit: Payer: Medicare Other | Admitting: Rehabilitative and Restorative Service Providers"

## 2012-03-14 ENCOUNTER — Ambulatory Visit: Payer: Medicare Other | Admitting: Rehabilitative and Restorative Service Providers"

## 2012-03-19 ENCOUNTER — Encounter: Payer: Medicare Other | Admitting: Rehabilitative and Restorative Service Providers"

## 2012-03-21 ENCOUNTER — Encounter: Payer: Medicare Other | Admitting: Rehabilitative and Restorative Service Providers"

## 2012-03-26 ENCOUNTER — Ambulatory Visit: Payer: Medicare Other | Attending: Family Medicine | Admitting: Rehabilitation

## 2012-03-26 DIAGNOSIS — IMO0001 Reserved for inherently not codable concepts without codable children: Secondary | ICD-10-CM | POA: Insufficient documentation

## 2012-03-26 DIAGNOSIS — M546 Pain in thoracic spine: Secondary | ICD-10-CM | POA: Insufficient documentation

## 2012-03-26 DIAGNOSIS — M542 Cervicalgia: Secondary | ICD-10-CM | POA: Insufficient documentation

## 2012-03-28 ENCOUNTER — Ambulatory Visit: Payer: Medicare Other | Admitting: Rehabilitative and Restorative Service Providers"

## 2012-04-03 ENCOUNTER — Ambulatory Visit: Payer: Medicare Other | Admitting: Rehabilitative and Restorative Service Providers"

## 2012-04-08 ENCOUNTER — Ambulatory Visit: Payer: Medicare Other | Admitting: Rehabilitation

## 2012-04-10 ENCOUNTER — Ambulatory Visit: Payer: Medicare Other | Admitting: Rehabilitation

## 2012-04-15 ENCOUNTER — Ambulatory Visit: Payer: Medicare Other | Admitting: Rehabilitative and Restorative Service Providers"

## 2012-04-19 ENCOUNTER — Encounter: Payer: Medicare Other | Admitting: Rehabilitation

## 2012-10-08 ENCOUNTER — Encounter (HOSPITAL_COMMUNITY): Payer: Self-pay | Admitting: Pharmacy Technician

## 2012-10-09 NOTE — Progress Notes (Signed)
Dr Charlann Boxer-  Need PRE OP ORDERS PLEASE-  Has appt PST 10/14/12 Thanks

## 2012-10-11 NOTE — Patient Instructions (Signed)
[name]  10/11/2012   Your procedure is scheduled on:  10/21/12   Report to [facility] at 0500 AM.  Call this number if you have problems the morning of surgery: [phone]   Remember:   Do not eat food or drink liquids after midnight.   Take these medicines the morning of surgery with A SIP OF WATER:    Do not wear jewelry, make-up or nail polish.  Do not wear lotions, powders, or perfumes. .  Do not shave 48 hours prior to surgery.   Do not bring valuables to the hospital.  Contacts, dentures or bridgework may not be worn into surgery.  Leave suitcase in the car. After surgery it may be brought to your room.  For patients admitted to the hospital, checkout time is 11:00 AM the day of  discharge.     SEE CHG INSTRUCTION SHEET    Please read over the following fact sheets that you were given: MRSA Information, coughing and deep breathing exercises, leg exercises, Blood Transfusion Fact Sheet, Incentive Spirometry Fact Sheet                Failure to comply with these instructions may result in cancellation of your surgery.                Patient Signature ____________________________              Nurse Signature _____________________________

## 2012-10-14 ENCOUNTER — Inpatient Hospital Stay (HOSPITAL_COMMUNITY)
Admission: RE | Admit: 2012-10-14 | Discharge: 2012-10-14 | Disposition: A | Discharge status: Home / Self Care | Payer: Medicare Other | Source: Ambulatory Visit

## 2012-10-14 NOTE — Progress Notes (Signed)
EKG 10/09/12 on chart  Last office visit note with Dr Nanetta Batty( cardiologist ) 6/13 on chart

## 2012-10-16 ENCOUNTER — Encounter (HOSPITAL_COMMUNITY): Payer: Self-pay

## 2012-10-16 ENCOUNTER — Ambulatory Visit (HOSPITAL_COMMUNITY)
Admission: RE | Admit: 2012-10-16 | Discharge: 2012-10-16 | Disposition: A | Discharge status: Home / Self Care | Payer: Medicare Other | Source: Ambulatory Visit | Attending: Orthopedic Surgery | Admitting: Orthopedic Surgery

## 2012-10-16 ENCOUNTER — Encounter (HOSPITAL_COMMUNITY)
Admission: RE | Admit: 2012-10-16 | Discharge: 2012-10-16 | Disposition: A | Discharge status: Home / Self Care | Payer: Medicare Other | Source: Ambulatory Visit | Attending: Orthopedic Surgery | Admitting: Orthopedic Surgery

## 2012-10-16 DIAGNOSIS — Z951 Presence of aortocoronary bypass graft: Secondary | ICD-10-CM | POA: Insufficient documentation

## 2012-10-16 DIAGNOSIS — Z01818 Encounter for other preprocedural examination: Secondary | ICD-10-CM | POA: Insufficient documentation

## 2012-10-16 DIAGNOSIS — I517 Cardiomegaly: Secondary | ICD-10-CM | POA: Insufficient documentation

## 2012-10-16 DIAGNOSIS — Z96659 Presence of unspecified artificial knee joint: Secondary | ICD-10-CM | POA: Insufficient documentation

## 2012-10-16 DIAGNOSIS — Z01812 Encounter for preprocedural laboratory examination: Secondary | ICD-10-CM | POA: Insufficient documentation

## 2012-10-16 HISTORY — DX: Anemia, unspecified: D64.9

## 2012-10-16 HISTORY — DX: Stress incontinence (female) (male): N39.3

## 2012-10-16 HISTORY — DX: Sleep apnea, unspecified: G47.30

## 2012-10-16 HISTORY — DX: Cardiac murmur, unspecified: R01.1

## 2012-10-16 HISTORY — DX: Unspecified glaucoma: H40.9

## 2012-10-16 HISTORY — DX: Peripheral vascular disease, unspecified: I73.9

## 2012-10-16 LAB — URINALYSIS, ROUTINE W REFLEX MICROSCOPIC
Bilirubin Urine: NEGATIVE
Glucose, UA: NEGATIVE mg/dL
Ketones, ur: NEGATIVE mg/dL
Leukocytes, UA: NEGATIVE
Nitrite: NEGATIVE
Protein, ur: NEGATIVE mg/dL
Specific Gravity, Urine: 1.022 (ref 1.005–1.030)
Urobilinogen, UA: 0.2 mg/dL (ref 0.0–1.0)
pH: 5.5 (ref 5.0–8.0)

## 2012-10-16 LAB — URINE MICROSCOPIC-ADD ON

## 2012-10-16 LAB — BASIC METABOLIC PANEL
BUN: 9 mg/dL (ref 6–23)
CO2: 25 mEq/L (ref 19–32)
Calcium: 9.1 mg/dL (ref 8.4–10.5)
Chloride: 102 mEq/L (ref 96–112)
Creatinine, Ser: 0.72 mg/dL (ref 0.50–1.10)
GFR calc Af Amer: >90 mL/min (ref >90)
GFR calc non Af Amer: >90 mL/min (ref >90)
Glucose, Bld: 102 mg/dL — ABNORMAL HIGH (ref 70–99)
Potassium: 4.4 mEq/L (ref 3.5–5.1)
Sodium: 137 mEq/L (ref 135–145)

## 2012-10-16 LAB — CBC
HCT: 42.9 % (ref 36.0–46.0)
Hemoglobin: 14.7 g/dL (ref 12.0–15.0)
MCH: 33.6 pg (ref 26.0–34.0)
MCHC: 34.3 g/dL (ref 30.0–36.0)
MCV: 97.9 fL (ref 78.0–100.0)
Platelets: 264 10*3/uL (ref 150–400)
RBC: 4.38 MIL/uL (ref 3.87–5.11)
RDW: 14.2 % (ref 11.5–15.5)
WBC: 17 10*3/uL — ABNORMAL HIGH (ref 4.0–10.5)

## 2012-10-16 LAB — APTT: aPTT: 33 seconds (ref 24–37)

## 2012-10-16 LAB — ABO/RH: ABO/RH(D): A POS

## 2012-10-16 LAB — HCG, SERUM, QUALITATIVE: Preg, Serum: NEGATIVE

## 2012-10-16 LAB — SURGICAL PCR SCREEN
MRSA, PCR: NEGATIVE
Staphylococcus aureus: NEGATIVE

## 2012-10-16 LAB — PROTIME-INR
INR: 0.94 (ref 0.00–1.49)
Prothrombin Time: 12.5 seconds (ref 11.6–15.2)

## 2012-10-16 NOTE — Patient Instructions (Addendum)
YOUR SURGERY IS SCHEDULED AT [facility]  ON:  Monday  3/3  REPORT TO [facility] AT:  5:00 AM      PHONE # FOR SHORT STAY IS [phone]  DO NOT EAT OR DRINK ANYTHING AFTER MIDNIGHT THE NIGHT BEFORE YOUR SURGERY.  YOU MAY BRUSH YOUR TEETH, RINSE OUT YOUR MOUTH--BUT NO WATER, NO FOOD, NO CHEWING GUM, NO MINTS, NO CANDIES, NO CHEWING TOBACCO.  PLEASE TAKE THE FOLLOWING MEDICATIONS THE AM OF YOUR SURGERY WITH A FEW SIPS OF WATER:  USE YOUR ALBUTEROL AND ADVAIR INHALERS AND YOUR EYE DROPS - AND BRING TO HOSPITAL.   TAKE YOUR WELLBUTRIN, CLONAZEPAM, ISOSORBIDE, METOPROLOL AND PROTONIX.  IF YOU USE INHALERS--USE YOUR INHALERS THE AM OF YOUR SURGERY AND BRING INHALERS TO THE HOSPITAL.    IF YOU ARE DIABETIC:  DO NOT TAKE ANY DIABETIC MEDICATIONS THE AM OF YOUR SURGERY.  IF YOU TAKE INSULIN IN THE EVENINGS--PLEASE ONLY TAKE 1/2 NORMAL EVENING DOSE THE NIGHT BEFORE YOUR SURGERY.  NO INSULIN THE AM OF YOUR SURGERY.  IF YOU HAVE SLEEP APNEA AND USE CPAP OR BIPAP--PLEASE BRING THE MASK AND THE TUBING.  DO NOT BRING YOUR MACHINE.  DO NOT BRING VALUABLES, MONEY, CREDIT CARDS.  DO NOT WEAR JEWELRY, MAKE-UP, NAIL POLISH AND NO METAL PINS OR CLIPS IN YOUR HAIR. CONTACT LENS, DENTURES / PARTIALS, GLASSES SHOULD NOT BE WORN TO SURGERY AND IN MOST CASES-HEARING AIDS WILL NEED TO BE REMOVED.  BRING YOUR GLASSES CASE, ANY EQUIPMENT NEEDED FOR YOUR CONTACT LENS. FOR PATIENTS ADMITTED TO THE HOSPITAL--CHECK OUT TIME THE DAY OF DISCHARGE IS 11:00 AM.  ALL INPATIENT ROOMS ARE PRIVATE - WITH BATHROOM, TELEPHONE, TELEVISION AND WIFI INTERNET.  IF YOU ARE BEING DISCHARGED THE SAME DAY OF YOUR SURGERY--YOU CAN NOT DRIVE YOURSELF HOME--AND SHOULD NOT GO HOME ALONE BY TAXI OR BUS.  NO DRIVING OR OPERATING MACHINERY FOR 24 HOURS FOLLOWING ANESTHESIA / PAIN MEDICATIONS.  PLEASE MAKE ARRANGEMENTS FOR SOMEONE TO BE WITH YOU AT HOME THE FIRST 24 HOURS AFTER SURGERY. RESPONSIBLE DRIVER'S  NAME___________________________                                               PHONE #   _______________________                               PLEASE READ OVER ANY  FACT SHEETS THAT YOU WERE GIVEN: MRSA INFORMATION, BLOOD TRANSFUSION INFORMATION, INCENTIVE SPIROMETER INFORMATION. FAILURE TO FOLLOW THESE INSTRUCTIONS MAY RESULT IN THE CANCELLATION OF YOUR SURGERY.   PATIENT SIGNATURE_________________________________

## 2012-10-16 NOTE — Pre-Procedure Instructions (Signed)
PREOP CBC, BMET, PT, PTT, UA, SERUM PREGNANCY, T/S, CXR WERE DONE TODAY AT [facility].  PT HAS CARDIAC CLEARANCE AND AN EKG FROM 10/09/12 ON HER CHART FROM DR. BERRY.  OFFICE NOTES FROM DR. BERRY 02/01/12, STRESS TEST REPORT 04/18/11, ECHO REPORT 09/24/07 ALSO ON CHART FROM DR. BERRY. PT'S PREOP CBC REPORT FAXED TO DR. Nilsa Nutting OFFICE--WBC'S 17,000.

## 2012-10-18 NOTE — Pre-Procedure Instructions (Signed)
SHERRIE - SURGERY SCHEDULER FOR [facility] STATES ABNORMAL CBC REPORT THAT WAS FAXED TO DR. OLIN ON 10/16/12 WAS RECEIVED AND WILL BE SHOWN TO DR. Charlann Boxer.  CHART TAKEN TO SHORT STAY-SURGERY IS Monday  10/21/12.

## 2012-10-18 NOTE — Pre-Procedure Instructions (Signed)
PT'S MOST RECENT OFFICE NOTE 10/09/12 FROM DR. BERRY-RECEIVED AND ON PT'S CHART.

## 2012-10-20 NOTE — H&P (Signed)
TOTAL KNEE ADMISSION H&P  Patient is being admitted for right total knee arthroplasty.  Subjective:  Chief Complaint:right knee OA / pain.  HPI: [name], 52 y.o. female, has a history of pain and functional disability in the right knee due to arthritis and has failed non-surgical conservative treatments for greater than 12 weeks to includeNSAID's and/or analgesics, corticosteriod injections, viscosupplementation injections and activity modification.  Onset of symptoms was gradual, starting >10 years ago with gradually worsening course since that time. The patient noted prior procedures on the knee to include  arthroscopy on the right knee(s).  Patient currently rates pain in the right knee(s) at 10 out of 10 with activity. Patient has worsening of pain with activity and weight bearing, pain that interferes with activities of daily living, pain with passive range of motion, crepitus and joint swelling.  Patient has evidence of periarticular osteophytes and joint space narrowing by imaging studies. There is no active infection.  Risks, benefits and expectations were discussed with the patient. Patient understand the risks, benefits and expectations and wishes to proceed with surgery.   D/C Plans:   Home with HHPT  Post-op Meds:  ?  Tranexamic Acid:   Not to be given - previous MI's  Decadron:    To be given  FYI:    Hydrocodone = N&V  Xarelto for 2 weeks and then ASA 325 bid for 4 weeks.   Past Medical History  Diagnosis Date  . Coronary artery disease   . MI (myocardial infarction)   . Borderline diabetic     PT STATES NOT DIABETIC--DID HAVE GESTATIONAL DIABETES  . Hypertension   . High cholesterol   . COPD (chronic obstructive pulmonary disease)   . Asthma   . Bipolar 1 disorder   . Fibroids     UTERINE FIBROIDS--PT STATES VAGINAL BLEEDING OFF AND ON WHICH SHE RELATES TO HER FIBROIDS-  . Sleep apnea     DOES NOT USE CPAP--HAS NASAL MASK AT HOME  . Complication of  anesthesia     CLAUSTROPHOBIA-STATES DOES NOT TOLERATE NASAL CPAP  . Heart murmur   . Chronic kidney disease     LEFT RENAL CYST  . Urinary, incontinence, stress female   . Arthritis     OA KNEES, SHOULDERS;  DJD   . Anemia   . Hyperlipidemia   . Peripheral vascular disease     CAROTID ARTERY BLOCKAGES-MONITORED BY DR. Cherly Hensen CAROTID SURGERY    PT STATES SHE WAS TOLD SHE HAS"BLOOD CLOTS ALL OVER HER BODY"   . Glaucoma     BOTH EYES    Past Surgical History  Procedure Laterality Date  . Cardiac surgery    . Cardiac stents    . Coronary artery bypass graft  2008  . Pinning left foot    . C-sections x 2    . 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 Right knee arthroscopy    . Eye surgery      MULTIPLE LASER SURGERIES LEFT EYE BECAUSE OF GLAUCOMA     Allergies  Allergen Reactions  . Haldol (Haloperidol Decanoate) Anaphylaxis  . Amoxicillin Hives    History  Substance Use Topics  . Smoking status: Current Every Day Smoker -- 0.50 packs/day for 25 years    Types: Cigarettes  . Smokeless tobacco: Never Used  . Alcohol Use: No      Review of Systems  Constitutional:  Positive for malaise/fatigue.  HENT: Negative.   Eyes: Positive for blurred vision.  Respiratory: Positive for shortness of breath.   Cardiovascular: Positive for palpitations.  Gastrointestinal: Positive for abdominal pain (2 fibroid tumors).  Genitourinary: Negative.   Musculoskeletal: Positive for myalgias, back pain and joint pain.  Skin: Negative.   Neurological: Negative.   Endo/Heme/Allergies: Negative.   Psychiatric/Behavioral: Positive for memory loss. The patient has insomnia.     Objective:  Physical Exam  Constitutional: She is oriented to person, place, and time. She appears well-developed and well-nourished.  HENT:  Head: Normocephalic and atraumatic.  Mouth/Throat: Oropharynx is clear and moist. She has dentures.  Eyes: Pupils are  equal, round, and reactive to light.  Neck: Neck supple. No JVD present. No tracheal deviation present. No thyromegaly present.  Cardiovascular: Normal rate, regular rhythm, normal heart sounds and intact distal pulses.   Respiratory: Effort normal and breath sounds normal. No respiratory distress. She has no wheezes.  GI: Soft. There is no tenderness. There is no guarding.  Musculoskeletal:       Right knee: She exhibits decreased range of motion, swelling and bony tenderness. She exhibits no ecchymosis, no laceration and no erythema. Tenderness found.  Lymphadenopathy:    She has no cervical adenopathy.  Neurological: She is alert and oriented to person, place, and time.  Skin: Skin is warm and dry.  Psychiatric: She has a normal mood and affect.     Labs:  Estimated body mass index is 33.28 kg/(m^2) as calculated from the following:   Height as of 11/18/11: 5\' 5"  (1.651 m).   Weight as of 11/18/11: 90.719 kg (200 lb).  Imaging Review Plain radiographs demonstrate severe degenerative joint disease of the right knee(s). The overall alignment isneutral. The bone quality appears to be good for age and reported activity level.  Assessment/Plan:  End stage arthritis, right knee   The patient history, physical examination, clinical judgment of the provider and imaging studies are consistent with end stage degenerative joint disease of the right knee(s) and total knee arthroplasty is deemed medically necessary. The treatment options including medical management, injection therapy arthroscopy and arthroplasty were discussed at length. The risks and benefits of total knee arthroplasty were presented and reviewed. The risks due to aseptic loosening, infection, stiffness, patella tracking problems, thromboembolic complications and other imponderables were discussed. The patient acknowledged the explanation, agreed to proceed with the plan and consent was signed. Patient is being admitted for  inpatient treatment for surgery, pain control, PT, OT, prophylactic antibiotics, VTE prophylaxis, progressive ambulation and ADL's and discharge planning. The patient is planning to be discharged home with home health services.    Anastasio Auerbach [place]   PAC  10/20/2012, 8:55 PM

## 2012-10-20 NOTE — Anesthesia Preprocedure Evaluation (Addendum)
Anesthesia Evaluation  Patient identified by MRN, date of birth, ID band Patient awake    Reviewed: Allergy & Precautions, H&P , NPO status , Patient's Chart, lab work & pertinent test results, reviewed documented beta blocker date and time   Airway Mallampati: III TM Distance: >3 FB Neck ROM: full    Dental  (+) Edentulous Upper and Missing Only has a couple of teeth on bottom:   Pulmonary asthma , sleep apnea , COPDCurrent Smoker,  breath sounds clear to auscultation  Pulmonary exam normal       Cardiovascular hypertension, Pt. on home beta blockers and Pt. on medications + CAD, + Past MI, + CABG and + Peripheral Vascular Disease Rhythm:regular Rate:Normal  On plavix but stopped 10 days ago   Neuro/Psych Bipolar Disorder Carotid stenosis    GI/Hepatic negative GI ROS, Neg liver ROS,   Endo/Other  negative endocrine ROS  Renal/GU negative Renal ROS  negative genitourinary   Musculoskeletal   Abdominal   Peds  Hematology negative hematology ROS (+)   Anesthesia Other Findings   Reproductive/Obstetrics negative OB ROS                         Anesthesia Physical Anesthesia Plan  ASA: III  Anesthesia Plan: Spinal   Post-op Pain Management:    Induction:   Airway Management Planned: Simple Face Mask  Additional Equipment:   Intra-op Plan:   Post-operative Plan:   Informed Consent: I have reviewed the patients History and Physical, chart, labs and discussed the procedure including the risks, benefits and alternatives for the proposed anesthesia with the patient or authorized representative who has indicated his/her understanding and acceptance.   Dental Advisory Given  Plan Discussed with: CRNA and Surgeon  Anesthesia Plan Comments:         Anesthesia Quick Evaluation

## 2012-10-21 ENCOUNTER — Encounter (HOSPITAL_COMMUNITY): Payer: Self-pay | Admitting: Anesthesiology

## 2012-10-21 ENCOUNTER — Other Ambulatory Visit: Payer: Self-pay

## 2012-10-21 ENCOUNTER — Inpatient Hospital Stay (HOSPITAL_COMMUNITY)
Admission: RE | Admit: 2012-10-21 | Discharge: 2012-10-22 | DRG: 470 | Disposition: A | Discharge status: Home Health | Payer: Medicare Other | Source: Ambulatory Visit | Attending: Orthopedic Surgery | Admitting: Orthopedic Surgery

## 2012-10-21 ENCOUNTER — Inpatient Hospital Stay (HOSPITAL_COMMUNITY): Payer: Medicare Other | Admitting: Anesthesiology

## 2012-10-21 ENCOUNTER — Encounter (HOSPITAL_COMMUNITY)
Admission: RE | Disposition: A | Discharge status: Home Health | Payer: Self-pay | Source: Ambulatory Visit | Attending: Orthopedic Surgery

## 2012-10-21 ENCOUNTER — Encounter (HOSPITAL_COMMUNITY): Payer: Self-pay | Admitting: *Deleted

## 2012-10-21 DIAGNOSIS — Z96651 Presence of right artificial knee joint: Secondary | ICD-10-CM

## 2012-10-21 DIAGNOSIS — G4730 Sleep apnea, unspecified: Secondary | ICD-10-CM | POA: Diagnosis present

## 2012-10-21 DIAGNOSIS — E669 Obesity, unspecified: Secondary | ICD-10-CM | POA: Diagnosis present

## 2012-10-21 DIAGNOSIS — I10 Essential (primary) hypertension: Secondary | ICD-10-CM | POA: Diagnosis present

## 2012-10-21 DIAGNOSIS — Z6834 Body mass index (BMI) 34.0-34.9, adult: Secondary | ICD-10-CM

## 2012-10-21 DIAGNOSIS — F17200 Nicotine dependence, unspecified, uncomplicated: Secondary | ICD-10-CM | POA: Diagnosis present

## 2012-10-21 DIAGNOSIS — Z951 Presence of aortocoronary bypass graft: Secondary | ICD-10-CM

## 2012-10-21 DIAGNOSIS — J449 Chronic obstructive pulmonary disease, unspecified: Secondary | ICD-10-CM | POA: Diagnosis present

## 2012-10-21 DIAGNOSIS — D62 Acute posthemorrhagic anemia: Secondary | ICD-10-CM | POA: Diagnosis not present

## 2012-10-21 DIAGNOSIS — Z96659 Presence of unspecified artificial knee joint: Secondary | ICD-10-CM

## 2012-10-21 DIAGNOSIS — I2510 Atherosclerotic heart disease of native coronary artery without angina pectoris: Secondary | ICD-10-CM | POA: Diagnosis present

## 2012-10-21 DIAGNOSIS — I739 Peripheral vascular disease, unspecified: Secondary | ICD-10-CM | POA: Diagnosis present

## 2012-10-21 DIAGNOSIS — E785 Hyperlipidemia, unspecified: Secondary | ICD-10-CM | POA: Diagnosis present

## 2012-10-21 DIAGNOSIS — M1710 Unilateral primary osteoarthritis, unspecified knee: Principal | ICD-10-CM | POA: Diagnosis present

## 2012-10-21 DIAGNOSIS — I252 Old myocardial infarction: Secondary | ICD-10-CM

## 2012-10-21 DIAGNOSIS — E7800 Pure hypercholesterolemia, unspecified: Secondary | ICD-10-CM | POA: Diagnosis present

## 2012-10-21 DIAGNOSIS — H409 Unspecified glaucoma: Secondary | ICD-10-CM | POA: Diagnosis present

## 2012-10-21 DIAGNOSIS — J4489 Other specified chronic obstructive pulmonary disease: Secondary | ICD-10-CM | POA: Diagnosis present

## 2012-10-21 DIAGNOSIS — F319 Bipolar disorder, unspecified: Secondary | ICD-10-CM | POA: Diagnosis present

## 2012-10-21 DIAGNOSIS — R7309 Other abnormal glucose: Secondary | ICD-10-CM | POA: Diagnosis present

## 2012-10-21 HISTORY — PX: TOTAL KNEE ARTHROPLASTY: SHX125

## 2012-10-21 LAB — TYPE AND SCREEN
ABO/RH(D): A POS
Antibody Screen: NEGATIVE

## 2012-10-21 SURGERY — ARTHROPLASTY, KNEE, TOTAL
Anesthesia: Spinal | Site: Knee | Laterality: Right | Wound class: Clean

## 2012-10-21 MED ORDER — CHLORHEXIDINE GLUCONATE 4 % EX LIQD
60.0000 mL | Freq: Once | CUTANEOUS | Status: DC
  Filled 2012-10-21: qty 60

## 2012-10-21 MED ORDER — ALBUTEROL SULFATE HFA 108 (90 BASE) MCG/ACT IN AERS
2.0000 | INHALATION_SPRAY | Freq: Four times a day (QID) | RESPIRATORY_TRACT | Status: DC | PRN
  Filled 2012-10-21: qty 6.7

## 2012-10-21 MED ORDER — NIACIN ER 500 MG PO CPCR
1000.0000 mg | ORAL_CAPSULE | Freq: Every day | ORAL | Status: DC
  Administered 2012-10-21: 1000 mg via ORAL
  Filled 2012-10-21 (×2): qty 2

## 2012-10-21 MED ORDER — PHENOL 1.4 % MT LIQD
1.0000 | OROMUCOSAL | Status: DC | PRN
  Filled 2012-10-21: qty 177

## 2012-10-21 MED ORDER — HYDROCODONE-ACETAMINOPHEN 7.5-325 MG PO TABS
1.0000 | ORAL_TABLET | ORAL | Status: DC
  Administered 2012-10-21 (×2): 2 via ORAL
  Administered 2012-10-21: 1 via ORAL
  Administered 2012-10-22 (×5): 2 via ORAL
  Filled 2012-10-21: qty 1
  Filled 2012-10-21 (×4): qty 2
  Filled 2012-10-21: qty 1
  Filled 2012-10-21 (×3): qty 2

## 2012-10-21 MED ORDER — PANTOPRAZOLE SODIUM 40 MG PO TBEC
40.0000 mg | DELAYED_RELEASE_TABLET | Freq: Every day | ORAL | Status: DC
  Administered 2012-10-21 – 2012-10-22 (×2): 40 mg via ORAL
  Filled 2012-10-21 (×2): qty 1

## 2012-10-21 MED ORDER — CLINDAMYCIN PHOSPHATE 900 MG/50ML IV SOLN
INTRAVENOUS | Status: DC | PRN
  Administered 2012-10-21: 900 mg via INTRAVENOUS

## 2012-10-21 MED ORDER — METHOCARBAMOL 100 MG/ML IJ SOLN: 500.0000 mg | Freq: Four times a day (QID) | INTRAVENOUS | Status: DC | PRN

## 2012-10-21 MED ORDER — ATORVASTATIN CALCIUM 80 MG PO TABS
80.0000 mg | ORAL_TABLET | Freq: Every day | ORAL | Status: DC
Start: 2012-10-21 — End: 2012-10-22
  Administered 2012-10-21: 80 mg via ORAL
  Filled 2012-10-21 (×2): qty 1

## 2012-10-21 MED ORDER — BRIMONIDINE TARTRATE 0.15 % OP SOLN
1.0000 [drp] | Freq: Two times a day (BID) | OPHTHALMIC | Status: DC
  Administered 2012-10-21 – 2012-10-22 (×2): 1 [drp] via OPHTHALMIC
  Filled 2012-10-21: qty 5

## 2012-10-21 MED ORDER — METOPROLOL TARTRATE 12.5 MG HALF TABLET
12.5000 mg | ORAL_TABLET | Freq: Two times a day (BID) | ORAL | Status: DC
  Administered 2012-10-21: 12.5 mg via ORAL
  Filled 2012-10-21 (×4): qty 1

## 2012-10-21 MED ORDER — BUPIVACAINE HCL (PF) 0.75 % IJ SOLN
INTRAMUSCULAR | Status: DC | PRN
  Administered 2012-10-21: 15 mg via INTRATHECAL

## 2012-10-21 MED ORDER — DEXAMETHASONE SODIUM PHOSPHATE 10 MG/ML IJ SOLN
INTRAMUSCULAR | Status: DC | PRN
  Administered 2012-10-21: 10 mg via INTRAVENOUS

## 2012-10-21 MED ORDER — METOPROLOL TARTRATE 12.5 MG HALF TABLET
12.5000 mg | ORAL_TABLET | Freq: Two times a day (BID) | ORAL | Status: DC
Start: 2012-10-21 — End: 2012-10-21
  Administered 2012-10-21: 12.5 mg via ORAL
  Filled 2012-10-21 (×2): qty 1

## 2012-10-21 MED ORDER — ASPIRIN EC 325 MG PO TBEC
325.0000 mg | DELAYED_RELEASE_TABLET | Freq: Every day | ORAL | Status: DC
  Administered 2012-10-22: 325 mg via ORAL
  Filled 2012-10-21 (×2): qty 1

## 2012-10-21 MED ORDER — NITROGLYCERIN 0.4 MG SL SUBL: 0.4000 mg | SUBLINGUAL_TABLET | SUBLINGUAL | Status: DC | PRN

## 2012-10-21 MED ORDER — OMEGA-3-ACID ETHYL ESTERS 1 G PO CAPS
1.0000 g | ORAL_CAPSULE | Freq: Two times a day (BID) | ORAL | Status: DC
  Administered 2012-10-21 – 2012-10-22 (×3): 1 g via ORAL
  Filled 2012-10-21 (×4): qty 1

## 2012-10-21 MED ORDER — HYDROMORPHONE HCL PF 1 MG/ML IJ SOLN: 0.2500 mg | INTRAMUSCULAR | Status: DC | PRN

## 2012-10-21 MED ORDER — TRAZODONE HCL 100 MG PO TABS
100.0000 mg | ORAL_TABLET | Freq: Every day | ORAL | Status: DC
  Administered 2012-10-21: 100 mg via ORAL
  Filled 2012-10-21 (×2): qty 2

## 2012-10-21 MED ORDER — 0.9 % SODIUM CHLORIDE (POUR BTL) OPTIME
TOPICAL | Status: DC | PRN
  Administered 2012-10-21: 1000 mL

## 2012-10-21 MED ORDER — ONDANSETRON HCL 4 MG PO TABS: 4.0000 mg | ORAL_TABLET | Freq: Four times a day (QID) | ORAL | Status: DC | PRN

## 2012-10-21 MED ORDER — ISOSORBIDE MONONITRATE ER 30 MG PO TB24
30.0000 mg | ORAL_TABLET | Freq: Two times a day (BID) | ORAL | Status: DC
  Administered 2012-10-21: 30 mg via ORAL
  Filled 2012-10-21 (×3): qty 1

## 2012-10-21 MED ORDER — ISOSORBIDE DINITRATE 30 MG PO TABS: 30.0000 mg | ORAL_TABLET | Freq: Every day | ORAL | Status: DC

## 2012-10-21 MED ORDER — CELECOXIB 200 MG PO CAPS
200.0000 mg | ORAL_CAPSULE | Freq: Two times a day (BID) | ORAL | Status: DC
  Administered 2012-10-21 – 2012-10-22 (×2): 200 mg via ORAL
  Filled 2012-10-21 (×4): qty 1

## 2012-10-21 MED ORDER — PROPOFOL 10 MG/ML IV BOLUS
INTRAVENOUS | Status: DC | PRN
  Administered 2012-10-21: 50 mg via INTRAVENOUS

## 2012-10-21 MED ORDER — ALUM & MAG HYDROXIDE-SIMETH 200-200-20 MG/5ML PO SUSP: 30.0000 mL | ORAL | Status: DC | PRN

## 2012-10-21 MED ORDER — LAMOTRIGINE 25 MG PO TABS
50.0000 mg | ORAL_TABLET | Freq: Every evening | ORAL | Status: DC
  Administered 2012-10-21 – 2012-10-22 (×2): 50 mg via ORAL
  Filled 2012-10-21 (×2): qty 2

## 2012-10-21 MED ORDER — MOMETASONE FURO-FORMOTEROL FUM 100-5 MCG/ACT IN AERO
2.0000 | INHALATION_SPRAY | Freq: Two times a day (BID) | RESPIRATORY_TRACT | Status: DC
  Administered 2012-10-21 – 2012-10-22 (×2): 2 via RESPIRATORY_TRACT
  Filled 2012-10-21: qty 8.8

## 2012-10-21 MED ORDER — DIPHENHYDRAMINE HCL 25 MG PO CAPS: 25.0000 mg | ORAL_CAPSULE | Freq: Four times a day (QID) | ORAL | Status: DC | PRN

## 2012-10-21 MED ORDER — HYDROMORPHONE HCL PF 1 MG/ML IJ SOLN
0.5000 mg | INTRAMUSCULAR | Status: DC | PRN
  Administered 2012-10-22: 1 mg via INTRAVENOUS
  Filled 2012-10-21: qty 1

## 2012-10-21 MED ORDER — FERROUS SULFATE 325 (65 FE) MG PO TABS
325.0000 mg | ORAL_TABLET | Freq: Three times a day (TID) | ORAL | Status: DC
  Administered 2012-10-21 – 2012-10-22 (×4): 325 mg via ORAL
  Filled 2012-10-21 (×6): qty 1

## 2012-10-21 MED ORDER — LAMOTRIGINE 100 MG PO TABS
100.0000 mg | ORAL_TABLET | Freq: Every evening | ORAL | Status: DC
Start: 2012-10-21 — End: 2012-10-22
  Administered 2012-10-21 – 2012-10-22 (×2): 100 mg via ORAL
  Filled 2012-10-21 (×2): qty 1

## 2012-10-21 MED ORDER — CLONAZEPAM 0.5 MG PO TABS
0.5000 mg | ORAL_TABLET | Freq: Two times a day (BID) | ORAL | Status: DC
Start: 2012-10-21 — End: 2012-10-22
  Administered 2012-10-21 – 2012-10-22 (×3): 0.5 mg via ORAL
  Filled 2012-10-21 (×3): qty 1

## 2012-10-21 MED ORDER — BUPROPION HCL ER (XL) 150 MG PO TB24
150.0000 mg | ORAL_TABLET | Freq: Every day | ORAL | Status: DC
  Administered 2012-10-21 – 2012-10-22 (×2): 150 mg via ORAL
  Filled 2012-10-21 (×4): qty 1

## 2012-10-21 MED ORDER — CEFAZOLIN SODIUM-DEXTROSE 2-3 GM-% IV SOLR: 2.0000 g | INTRAVENOUS | Status: DC

## 2012-10-21 MED ORDER — EPHEDRINE SULFATE 50 MG/ML IJ SOLN
INTRAMUSCULAR | Status: DC | PRN
  Administered 2012-10-21 (×2): 10 mg via INTRAVENOUS
  Administered 2012-10-21: 5 mg via INTRAVENOUS

## 2012-10-21 MED ORDER — METHOCARBAMOL 500 MG PO TABS: 500.0000 mg | ORAL_TABLET | Freq: Four times a day (QID) | ORAL | Status: DC | PRN

## 2012-10-21 MED ORDER — ZOLPIDEM TARTRATE 5 MG PO TABS: 5.0000 mg | ORAL_TABLET | Freq: Every evening | ORAL | Status: DC | PRN

## 2012-10-21 MED ORDER — POLYETHYLENE GLYCOL 3350 17 G PO PACK
17.0000 g | PACK | Freq: Two times a day (BID) | ORAL | Status: DC
  Administered 2012-10-21 – 2012-10-22 (×3): 17 g via ORAL
  Filled 2012-10-21 (×4): qty 1

## 2012-10-21 MED ORDER — BISACODYL 10 MG RE SUPP: 10.0000 mg | Freq: Every day | RECTAL | Status: DC | PRN

## 2012-10-21 MED ORDER — METOCLOPRAMIDE HCL 5 MG/ML IJ SOLN
INTRAMUSCULAR | Status: DC | PRN
  Administered 2012-10-21: 10 mg via INTRAVENOUS

## 2012-10-21 MED ORDER — SODIUM CHLORIDE 0.9 % IJ SOLN
INTRAMUSCULAR | Status: DC | PRN
  Administered 2012-10-21: 40 mL via INTRAVENOUS

## 2012-10-21 MED ORDER — DOCUSATE SODIUM 100 MG PO CAPS
100.0000 mg | ORAL_CAPSULE | Freq: Two times a day (BID) | ORAL | Status: DC
  Administered 2012-10-21 – 2012-10-22 (×3): 100 mg via ORAL
  Filled 2012-10-21 (×4): qty 1

## 2012-10-21 MED ORDER — KETAMINE HCL 10 MG/ML IJ SOLN
INTRAMUSCULAR | Status: DC | PRN
  Administered 2012-10-21 (×4): 4 mg via INTRAVENOUS
  Administered 2012-10-21: 5 mg via INTRAVENOUS
  Administered 2012-10-21: 4 mg via INTRAVENOUS
  Administered 2012-10-21: 8 mg via INTRAVENOUS
  Administered 2012-10-21: 2 mg via INTRAVENOUS
  Administered 2012-10-21 (×2): 5 mg via INTRAVENOUS
  Administered 2012-10-21: 7 mg via INTRAVENOUS
  Administered 2012-10-21 (×3): 4 mg via INTRAVENOUS
  Administered 2012-10-21 (×2): 5 mg via INTRAVENOUS
  Administered 2012-10-21 (×2): 4 mg via INTRAVENOUS
  Administered 2012-10-21: 5 mg via INTRAVENOUS
  Administered 2012-10-21: 4 mg via INTRAVENOUS
  Administered 2012-10-21: 5 mg via INTRAVENOUS
  Administered 2012-10-21: 4 mg via INTRAVENOUS

## 2012-10-21 MED ORDER — BUPIVACAINE LIPOSOME 1.3 % IJ SUSP
20.0000 mL | Freq: Once | INTRAMUSCULAR | Status: AC
Start: 2012-10-21 — End: 2012-10-21
  Administered 2012-10-21: 20 mL
  Filled 2012-10-21: qty 20

## 2012-10-21 MED ORDER — CLINDAMYCIN PHOSPHATE 600 MG/50ML IV SOLN
600.0000 mg | Freq: Four times a day (QID) | INTRAVENOUS | Status: AC
  Administered 2012-10-21 (×2): 600 mg via INTRAVENOUS
  Filled 2012-10-21 (×2): qty 50

## 2012-10-21 MED ORDER — FLEET ENEMA 7-19 GM/118ML RE ENEM: 1.0000 | ENEMA | Freq: Once | RECTAL | Status: AC | PRN

## 2012-10-21 MED ORDER — NIACIN ER (ANTIHYPERLIPIDEMIC) 1000 MG PO TBCR
1000.0000 mg | EXTENDED_RELEASE_TABLET | Freq: Every day | ORAL | Status: DC
  Filled 2012-10-21: qty 1

## 2012-10-21 MED ORDER — ONDANSETRON HCL 4 MG/2ML IJ SOLN: 4.0000 mg | Freq: Four times a day (QID) | INTRAMUSCULAR | Status: DC | PRN

## 2012-10-21 MED ORDER — VITAMIN B-12 100 MCG PO TABS
50.0000 ug | ORAL_TABLET | Freq: Every day | ORAL | Status: DC
  Administered 2012-10-21 – 2012-10-22 (×2): 50 ug via ORAL
  Filled 2012-10-21 (×2): qty 1

## 2012-10-21 MED ORDER — DEXAMETHASONE SODIUM PHOSPHATE 10 MG/ML IJ SOLN: 10.0000 mg | Freq: Once | INTRAMUSCULAR | Status: DC

## 2012-10-21 MED ORDER — LACTATED RINGERS IV SOLN: INTRAVENOUS | Status: DC

## 2012-10-21 MED ORDER — PROPOFOL 10 MG/ML IV EMUL
INTRAVENOUS | Status: DC | PRN
  Administered 2012-10-21: 200 ug/kg/min via INTRAVENOUS

## 2012-10-21 MED ORDER — SODIUM CHLORIDE 0.9 % IV SOLN
INTRAVENOUS | Status: DC
  Administered 2012-10-21 – 2012-10-22 (×2): via INTRAVENOUS
  Filled 2012-10-21 (×12): qty 1000

## 2012-10-21 MED ORDER — SODIUM CHLORIDE 0.9 % IR SOLN
Status: DC | PRN
  Administered 2012-10-21: 1000 mL

## 2012-10-21 MED ORDER — MIDAZOLAM HCL 5 MG/5ML IJ SOLN
INTRAMUSCULAR | Status: DC | PRN
  Administered 2012-10-21: 0.5 mg via INTRAVENOUS
  Administered 2012-10-21: 1 mg via INTRAVENOUS
  Administered 2012-10-21: 2 mg via INTRAVENOUS

## 2012-10-21 MED ORDER — VITAMIN C 500 MG PO TABS
500.0000 mg | ORAL_TABLET | Freq: Every day | ORAL | Status: DC
  Administered 2012-10-21 – 2012-10-22 (×2): 500 mg via ORAL
  Filled 2012-10-21 (×2): qty 1

## 2012-10-21 MED ORDER — MENTHOL 3 MG MT LOZG
1.0000 | LOZENGE | OROMUCOSAL | Status: DC | PRN
  Filled 2012-10-21: qty 9

## 2012-10-21 MED ORDER — LACTATED RINGERS IV SOLN
INTRAVENOUS | Status: DC | PRN
  Administered 2012-10-21 (×3): via INTRAVENOUS

## 2012-10-21 MED ORDER — DEXAMETHASONE SODIUM PHOSPHATE 10 MG/ML IJ SOLN
10.0000 mg | Freq: Once | INTRAMUSCULAR | Status: AC
  Administered 2012-10-22: 10 mg via INTRAVENOUS
  Filled 2012-10-21 (×2): qty 1

## 2012-10-21 MED ORDER — CLOPIDOGREL BISULFATE 75 MG PO TABS
75.0000 mg | ORAL_TABLET | Freq: Every day | ORAL | Status: DC
  Administered 2012-10-22: 75 mg via ORAL
  Filled 2012-10-21 (×3): qty 1

## 2012-10-21 MED ORDER — OXYBUTYNIN CHLORIDE ER 10 MG PO TB24
10.0000 mg | ORAL_TABLET | Freq: Every day | ORAL | Status: DC
  Administered 2012-10-21 – 2012-10-22 (×2): 10 mg via ORAL
  Filled 2012-10-21 (×2): qty 1

## 2012-10-21 SURGICAL SUPPLY — 57 items
ADH SKN CLS APL DERMABOND .7 (GAUZE/BANDAGES/DRESSINGS) ×1
BAG SPEC THK2 15X12 ZIP CLS (MISCELLANEOUS) ×1
BAG ZIPLOCK 12X15 (MISCELLANEOUS) ×2 IMPLANT
BANDAGE ELASTIC 6 VELCRO ST LF (GAUZE/BANDAGES/DRESSINGS) ×2 IMPLANT
BANDAGE ESMARK 6X9 LF (GAUZE/BANDAGES/DRESSINGS) ×1 IMPLANT
BLADE SAW SGTL 13.0X1.19X90.0M (BLADE) ×2 IMPLANT
BNDG CMPR 9X6 STRL LF SNTH (GAUZE/BANDAGES/DRESSINGS) ×1
BNDG ESMARK 6X9 LF (GAUZE/BANDAGES/DRESSINGS) ×2
BOWL SMART MIX CTS (DISPOSABLE) ×2 IMPLANT
CEMENT HV SMART SET (Cement) ×2 IMPLANT
CLOTH BEACON ORANGE TIMEOUT ST (SAFETY) ×2 IMPLANT
CUFF TOURN SGL QUICK 34 (TOURNIQUET CUFF) ×2
CUFF TRNQT CYL 34X4X40X1 (TOURNIQUET CUFF) ×1 IMPLANT
DECANTER SPIKE VIAL GLASS SM (MISCELLANEOUS) ×2 IMPLANT
DERMABOND ADVANCED (GAUZE/BANDAGES/DRESSINGS) ×1
DERMABOND ADVANCED .7 DNX12 (GAUZE/BANDAGES/DRESSINGS) ×1 IMPLANT
DRAPE EXTREMITY T 121X128X90 (DRAPE) ×2 IMPLANT
DRAPE POUCH INSTRU U-SHP 10X18 (DRAPES) ×2 IMPLANT
DRAPE U-SHAPE 47X51 STRL (DRAPES) ×2 IMPLANT
DRSG AQUACEL AG ADV 3.5X10 (GAUZE/BANDAGES/DRESSINGS) ×2 IMPLANT
DRSG TEGADERM 4X4.75 (GAUZE/BANDAGES/DRESSINGS) ×2 IMPLANT
DURAPREP 26ML APPLICATOR (WOUND CARE) ×2 IMPLANT
ELECT REM PT RETURN 9FT ADLT (ELECTROSURGICAL) ×2
ELECTRODE REM PT RTRN 9FT ADLT (ELECTROSURGICAL) ×1 IMPLANT
EVACUATOR 1/8 PVC DRAIN (DRAIN) ×2 IMPLANT
FACESHIELD LNG OPTICON STERILE (SAFETY) ×10 IMPLANT
GAUZE SPONGE 2X2 8PLY STRL LF (GAUZE/BANDAGES/DRESSINGS) ×1 IMPLANT
GLOVE BIOGEL PI IND STRL 7.5 (GLOVE) ×1 IMPLANT
GLOVE BIOGEL PI IND STRL 8 (GLOVE) ×1 IMPLANT
GLOVE BIOGEL PI INDICATOR 7.5 (GLOVE) ×1
GLOVE BIOGEL PI INDICATOR 8 (GLOVE) ×1
GLOVE ECLIPSE 8.0 STRL XLNG CF (GLOVE) ×2 IMPLANT
GLOVE ORTHO TXT STRL SZ7.5 (GLOVE) ×4 IMPLANT
GOWN BRE IMP PREV XXLGXLNG (GOWN DISPOSABLE) ×2 IMPLANT
GOWN STRL NON-REIN LRG LVL3 (GOWN DISPOSABLE) ×2 IMPLANT
HANDPIECE INTERPULSE COAX TIP (DISPOSABLE) ×2
KIT BASIN OR (CUSTOM PROCEDURE TRAY) ×2 IMPLANT
MANIFOLD NEPTUNE II (INSTRUMENTS) ×2 IMPLANT
NDL SAFETY ECLIPSE 18X1.5 (NEEDLE) ×1 IMPLANT
NEEDLE HYPO 18GX1.5 SHARP (NEEDLE) ×2
NS IRRIG 1000ML POUR BTL (IV SOLUTION) ×3 IMPLANT
PACK TOTAL JOINT (CUSTOM PROCEDURE TRAY) ×2 IMPLANT
POSITIONER SURGICAL ARM (MISCELLANEOUS) ×2 IMPLANT
SET HNDPC FAN SPRY TIP SCT (DISPOSABLE) ×1 IMPLANT
SET PAD KNEE POSITIONER (MISCELLANEOUS) ×2 IMPLANT
SPONGE GAUZE 2X2 STER 10/PKG (GAUZE/BANDAGES/DRESSINGS) ×1
SUCTION FRAZIER 12FR DISP (SUCTIONS) ×2 IMPLANT
SUT MNCRL AB 4-0 PS2 18 (SUTURE) ×2 IMPLANT
SUT VIC AB 1 CT1 36 (SUTURE) ×2 IMPLANT
SUT VIC AB 2-0 CT1 27 (SUTURE) ×4
SUT VIC AB 2-0 CT1 TAPERPNT 27 (SUTURE) ×3 IMPLANT
SUT VLOC 180 0 24IN GS25 (SUTURE) ×2 IMPLANT
SYR 50ML LL SCALE MARK (SYRINGE) ×2 IMPLANT
TOWEL OR 17X26 10 PK STRL BLUE (TOWEL DISPOSABLE) ×4 IMPLANT
TRAY FOLEY CATH 14FRSI W/METER (CATHETERS) ×2 IMPLANT
WATER STERILE IRR 1500ML POUR (IV SOLUTION) ×3 IMPLANT
WRAP KNEE MAXI GEL POST OP (GAUZE/BANDAGES/DRESSINGS) ×2 IMPLANT

## 2012-10-21 NOTE — Progress Notes (Signed)
Dr.Ewell in to see EKG. Heaviness resolved. Matt PA for Dr. Charlann Boxer aware of heaviness. No new orders from ortho.

## 2012-10-21 NOTE — Interval H&P Note (Signed)
History and Physical Interval Note:  10/21/2012 7:20 AM  [name]  has presented today for surgery, with the diagnosis of RIGHT KNEE OA  The various methods of treatment have been discussed with the patient and family. After consideration of risks, benefits and other options for treatment, the patient has consented to  Procedure(s): TOTAL KNEE ARTHROPLASTY (Right) as a surgical intervention .  The patient's history has been reviewed, patient examined, no change in status, stable for surgery.  I have reviewed the patient's chart and labs.  Questions were answered to the patient's satisfaction.     Shelda Pal

## 2012-10-21 NOTE — Anesthesia Postprocedure Evaluation (Signed)
  Anesthesia Post-op Note  Patient: [name]  Procedure(s) Performed: Procedure(s) (LRB): TOTAL KNEE ARTHROPLASTY (Right)  Patient Location: PACU  Anesthesia Type: Spinal  Level of Consciousness: awake and alert   Airway and Oxygen Therapy: Patient Spontanous Breathing  Post-op Pain: mild  Post-op Assessment: Post-op Vital signs reviewed, Patient's Cardiovascular Status Stable, Respiratory Function Stable, Patent Airway and No signs of Nausea or vomiting  Last Vitals:  Filed Vitals:   10/21/12 1015  BP: 120/61  Pulse: 65  Temp:   Resp: 16    Post-op Vital Signs: stable   Complications: No apparent anesthesia complications

## 2012-10-21 NOTE — Progress Notes (Signed)
PACU- C/O chest and left arm "heaviness". Dr. Leta Jungling aware. EKG ordered.

## 2012-10-21 NOTE — Progress Notes (Signed)
Utilization review completed.  

## 2012-10-21 NOTE — Progress Notes (Signed)
Dr. Charlann Boxer in to see. Will order tele be.

## 2012-10-21 NOTE — Transfer of Care (Signed)
Immediate Anesthesia Transfer of Care Note  Patient: [name]  Procedure(s) Performed: Procedure(s): TOTAL KNEE ARTHROPLASTY (Right)  Patient Location: PACU  Anesthesia Type:MAC and Spinal  Level of Consciousness: awake, alert , oriented, patient cooperative and responds to stimulation  Airway & Oxygen Therapy: Patient Spontanous Breathing and Patient connected to face mask oxygen  Post-op Assessment: Report given to PACU RN and Post -op Vital signs reviewed and stable  Post vital signs: Reviewed and stable  Complications: No apparent anesthesia complications

## 2012-10-21 NOTE — Preoperative (Signed)
Beta Blockers   Reason not to administer Beta Blockers:Not Applicable, took BB this am 

## 2012-10-21 NOTE — Anesthesia Procedure Notes (Signed)
Spinal  Patient location during procedure: OR Start time: 10/21/2012 7:40 AM End time: 10/21/2012 7:45 AM Staffing Anesthesiologist: Ronelle Nigh L Performed by: anesthesiologist  Preanesthetic Checklist Completed: patient identified, site marked, surgical consent, pre-op evaluation, timeout performed, IV checked, risks and benefits discussed and monitors and equipment checked Spinal Block Patient position: sitting Prep: Betadine Patient monitoring: heart rate, continuous pulse ox and blood pressure Approach: midline Location: L3-4 Injection technique: single-shot Needle Needle type: Whitacre  Needle gauge: 25 G Needle length: 9 cm Assessment Sensory level: T6 Additional Notes Expiration date of kit checked and confirmed. Patient tolerated procedure well, without complications.

## 2012-10-21 NOTE — Op Note (Signed)
NAME:  [name]                      MEDICAL RECORD NO.:  [id number]                             FACILITY:  [facility]      PHYSICIAN:  Madlyn Frankel. Charlann Boxer, M.D.  DATE OF BIRTH:  [birth year]      DATE OF PROCEDURE:  10/21/2012                                     OPERATIVE REPORT         PREOPERATIVE DIAGNOSIS:  Right knee osteoarthritis.      POSTOPERATIVE DIAGNOSIS:  Right knee osteoarthritis.      FINDINGS:  The patient was noted to have complete loss of cartilage and   bone-on-bone arthritis with associated osteophytes in the lateral and patellofemoral compartments of   the knee with a significant synovitis and associated effusion.      PROCEDURE:  Right total knee replacement.      COMPONENTS USED:  DePuy rotating platform posterior stabilized knee   system, a size 3 femur, 3 tibia, 10 mm PS insert, and 38 patellar   button.      SURGEON:  Madlyn Frankel. Charlann Boxer, M.D.      ASSISTANT:  Lanney Gins, PA-C.      ANESTHESIA:  Spinal.      SPECIMENS:  None.      COMPLICATION:  None.      DRAINS:  One Hemovac.  EBL: <100cc      TOURNIQUET TIME:   Total Tourniquet Time Documented: Thigh (Right) - 38 minutes Total: Thigh (Right) - 38 minutes  .      The patient was stable to the recovery room.      INDICATION FOR PROCEDURE:  [name] is a 52 y.o. female patient of   mine.  The patient had been seen, evaluated, and treated conservatively in the   office with medication, activity modification, and injections.  The patient had   radiographic changes of bone-on-bone arthritis with endplate sclerosis and osteophytes noted.      The patient failed conservative measures including medication, injections, and activity modification, and at this point was ready for more definitive measures.   Based on the radiographic changes and failed conservative measures, the patient   decided to proceed with total knee replacement.  Risks of infection,   DVT, component failure, need for  revision surgery, postop course, and   expectations were all   discussed and reviewed.  Consent was obtained for benefit of pain   relief.      PROCEDURE IN DETAIL:  The patient was brought to the operative theater.   Once adequate anesthesia, preoperative antibiotics, 900mg  of Clindamycin administered, the patient was positioned supine with the right thigh tourniquet placed.  The  right lower extremity was prepped and draped in sterile fashion.  A time-   out was performed identifying the patient, planned procedure, and   extremity.      The right lower extremity was placed in the [facility] leg holder.  The leg was   exsanguinated, tourniquet elevated to 250 mmHg.  A midline incision was   made followed by median parapatellar arthrotomy.  Following initial   exposure, attention was first  directed to the patella.  Precut   measurement was noted to be 24 mm.  I resected down to 14-15 mm and used a   38 patellar button to restore patellar height as well as cover the cut   surface.      The lug holes were drilled and a metal shim was placed to protect the   patella from retractors and saw blades.      At this point, attention was now directed to the femur.  The femoral   canal was opened with a drill, irrigated to try to prevent fat emboli.  An   intramedullary rod was passed at 5 degrees valgus, 10 mm of bone was   resected off the distal femur.  Following this resection, the tibia was   subluxated anteriorly.  Using the extramedullary guide, 10 mm of bone was resected off   the proximal lateral tibia based on radiographic assessment of tibial heights.  We confirmed the gap would be   stable medially and laterally with a 10 mm insert as well as confirmed   the cut was perpendicular in the coronal plane, checking with an alignment rod.      Once this was done, I sized the femur to be a size 3 in the anterior-   posterior dimension, chose a standard component based on medial and   lateral  dimension.  The size 3 rotation block was then pinned in   position anterior referenced using the C-clamp to set rotation.  The   anterior, posterior, and  chamfer cuts were made without difficulty nor   notching making certain that I was along the anterior cortex to help   with flexion gap stability.      The final box cut was made off the lateral aspect of distal femur.      At this point, the tibia was sized to be a size 3, the size 3 tray was   then pinned in position through the medial third of the tubercle,   drilled, and keel punched.  Trial reduction was now carried with a 3 femur,  3 tibia, a 10 mm insert, and the 38 patella botton.  The knee was brought to   extension, full extension with good flexion stability with the patella   tracking through the trochlea without application of pressure.  Given   all these findings, the trial components removed.  Final components were   opened and cement was mixed.  The knee was irrigated with normal saline   solution and pulse lavage.  The synovial lining was   then injected with 0.25% Marcaine with epinephrine and 1 cc of Toradol,   total of 61 cc.      The knee was irrigated.  Final implants were then cemented onto clean and   dried cut surfaces of bone with the knee brought to extension with a 10 mm trial insert.      Once the cement had fully cured, the excess cement was removed   throughout the knee.  I confirmed I was satisfied with the range of   motion and stability, and the final 10 mm PS insert was chosen.  It was   placed into the knee.      The tourniquet had been let down at 38 minutes.  No significant   hemostasis required.  The medium Hemovac drain was placed deep.  The   extensor mechanism was then reapproximated using #1 Vicryl with the knee  in flexion.  The   remaining wound was closed with 2-0 Vicryl and running 4-0 Monocryl.   The knee was cleaned, dried, dressed sterilely using Dermabond and   Aquacel dressing.   Drain site dressed separately.  The patient was then   brought to recovery room in stable condition, tolerating the procedure   well.   Please note that Physician Assistant, Lanney Gins, was present for the entirety of the case, and was utilized for pre-operative positioning, peri-operative retractor management, general facilitation of the procedure.  He was also utilized for primary wound closure at the end of the case.              Madlyn Frankel Charlann Boxer, M.D.

## 2012-10-21 NOTE — Evaluation (Signed)
Physical Therapy Evaluation Patient Details Name: [name] MRN: [id number] DOB: 1960/09/27 Today's Date: 10/21/2012 Time: 4098-1191 PT Time Calculation (min): 20 min  PT Assessment / Plan / Recommendation Clinical Impression  Pt s/p R TKR and agreeable to mobility POD #1.  Pt reports mobility assists with pain so very eager to get OOB today.  Pt would benefit from acute PT services in order to improve independence with transfers, ambulation, and stairs to prepare for d/c home with significant other and family to assist PRN.      PT Assessment  Patient needs continued PT services    Follow Up Recommendations  Home health PT    Does the patient have the potential to tolerate intense rehabilitation      Barriers to Discharge        Equipment Recommendations  Rolling walker with 5" wheels;Other (comment) (BSC)    Recommendations for Other Services     Frequency 7X/week    Precautions / Restrictions Precautions Precautions: Knee Restrictions Weight Bearing Restrictions: Yes RLE Weight Bearing: Weight bearing as tolerated   Pertinent Vitals/Pain Pt reported pain and premedication however stated pain improved after ambulation.      Mobility  Bed Mobility Bed Mobility: Supine to Sit Supine to Sit: 4: Min assist;HOB elevated Details for Bed Mobility Assistance: assist for R LE, verbal cues for technique Transfers Transfers: Sit to Stand;Stand to Sit Sit to Stand: 4: Min assist;From chair/3-in-1;From bed Stand to Sit: 4: Min assist;To chair/3-in-1 Details for Transfer Assistance: verbal cues for safe technique including R LE forward to assist with pain control, assist to rise and control descent, performed sitting in recliner x2 due to practicing technique Ambulation/Gait Ambulation/Gait Assistance: 4: Min assist Ambulation Distance (Feet): 80 Feet Assistive device: Rolling walker Ambulation/Gait Assistance Details: verbal cues for step length, sequence, RW distance,  heel strike/flat foot (initially forefoot only), assist due to LOB x1 Gait Pattern: Step-to pattern;Decreased stride length;Decreased stance time - right;Antalgic    Exercises     PT Diagnosis: Difficulty walking;Acute pain  PT Problem List: Decreased strength;Decreased range of motion;Decreased activity tolerance;Decreased mobility;Decreased knowledge of use of DME;Pain PT Treatment Interventions: DME instruction;Gait training;Stair training;Functional mobility training;Therapeutic activities;Therapeutic exercise;Patient/family education   PT Goals Acute Rehab PT Goals PT Goal Formulation: With patient Time For Goal Achievement: 10/28/12 Potential to Achieve Goals: Good Pt will go Supine/Side to Sit: with modified independence PT Goal: Supine/Side to Sit - Progress: Goal set today Pt will go Sit to Stand: with modified independence PT Goal: Sit to Stand - Progress: Goal set today Pt will go Stand to Sit: with modified independence PT Goal: Stand to Sit - Progress: Goal set today Pt will Ambulate: 51 - 150 feet;with modified independence;with least restrictive assistive device PT Goal: Ambulate - Progress: Goal set today Pt will Go Up / Down Stairs: 1-2 stairs;with supervision;with rolling walker PT Goal: Up/Down Stairs - Progress: Goal set today Pt will Perform Home Exercise Program: with supervision, verbal cues required/provided PT Goal: Perform Home Exercise Program - Progress: Goal set today  Visit Information  Last PT Received On: 10/21/12 Assistance Needed: +1    Subjective Data  Subjective: Oh yes, I'd love to get up! Patient Stated Goal: return home   Prior Functioning  Home Living Lives With: Significant other Available Help at Discharge: Family;Available PRN/intermittently Type of Home: House Home Access: Stairs to enter [employer] of Steps: 2 Entrance Stairs-Rails: None Home Layout: One level Home Adaptive Equipment: None Prior Function Level of  Independence: Independent Communication Communication: No difficulties    Cognition  Cognition Overall Cognitive Status: Appears within functional limits for tasks assessed/performed Arousal/Alertness: Awake/alert Orientation Level: Appears intact for tasks assessed Behavior During Session: [facility] for tasks performed    Extremity/Trunk Assessment Right Lower Extremity Assessment RLE ROM/Strength/Tone: Deficits RLE ROM/Strength/Tone Deficits: unable to lift against gravity without UE assist and knee lag, knee flexion at least 30* per functional observation RLE Sensation: WFL - Light Touch Left Lower Extremity Assessment LLE ROM/Strength/Tone: WFL for tasks assessed LLE Sensation: WFL - Light Touch   Balance    End of Session PT - End of Session Activity Tolerance: Patient tolerated treatment well Patient left: in chair;with call bell/phone within reach Nurse Communication: Mobility status  GP     LEMYRE,KATHrine E 10/21/2012, 3:36 PM Zenovia Jarred, PT, DPT 10/21/2012 Pager: [phone]

## 2012-10-22 ENCOUNTER — Encounter (HOSPITAL_COMMUNITY): Payer: Self-pay | Admitting: Orthopedic Surgery

## 2012-10-22 DIAGNOSIS — E669 Obesity, unspecified: Secondary | ICD-10-CM | POA: Diagnosis present

## 2012-10-22 DIAGNOSIS — D62 Acute posthemorrhagic anemia: Secondary | ICD-10-CM | POA: Diagnosis not present

## 2012-10-22 LAB — BASIC METABOLIC PANEL
BUN: 9 mg/dL (ref 6–23)
CO2: 25 mEq/L (ref 19–32)
Calcium: 8.7 mg/dL (ref 8.4–10.5)
Chloride: 100 mEq/L (ref 96–112)
Creatinine, Ser: 0.72 mg/dL (ref 0.50–1.10)
GFR calc Af Amer: >90 mL/min (ref >90)
GFR calc non Af Amer: >90 mL/min (ref >90)
Glucose, Bld: 131 mg/dL — ABNORMAL HIGH (ref 70–99)
Potassium: 3.9 mEq/L (ref 3.5–5.1)
Sodium: 135 mEq/L (ref 135–145)

## 2012-10-22 LAB — CBC
HCT: 32.1 % — ABNORMAL LOW (ref 36.0–46.0)
Hemoglobin: 10.8 g/dL — ABNORMAL LOW (ref 12.0–15.0)
MCH: 32.7 pg (ref 26.0–34.0)
MCHC: 33.6 g/dL (ref 30.0–36.0)
MCV: 97.3 fL (ref 78.0–100.0)
Platelets: 218 10*3/uL (ref 150–400)
RBC: 3.3 MIL/uL — ABNORMAL LOW (ref 3.87–5.11)
RDW: 14.4 % (ref 11.5–15.5)
WBC: 14.3 10*3/uL — ABNORMAL HIGH (ref 4.0–10.5)

## 2012-10-22 MED ORDER — HYDROCODONE-ACETAMINOPHEN 7.5-325 MG PO TABS: 1.0000 | ORAL_TABLET | ORAL | Status: DC | PRN

## 2012-10-22 MED ORDER — FERROUS SULFATE 325 (65 FE) MG PO TABS: 325.0000 mg | ORAL_TABLET | Freq: Three times a day (TID) | ORAL | Status: DC

## 2012-10-22 MED ORDER — POLYETHYLENE GLYCOL 3350 17 G PO PACK: 17.0000 g | PACK | Freq: Two times a day (BID) | ORAL | Status: DC

## 2012-10-22 MED ORDER — METHOCARBAMOL 500 MG PO TABS: 500.0000 mg | ORAL_TABLET | Freq: Four times a day (QID) | ORAL | Status: DC | PRN

## 2012-10-22 MED ORDER — METOPROLOL TARTRATE 25 MG PO TABS: 12.5000 mg | ORAL_TABLET | Freq: Two times a day (BID) | ORAL | Status: DC

## 2012-10-22 MED ORDER — DSS 100 MG PO CAPS: 100.0000 mg | ORAL_CAPSULE | Freq: Two times a day (BID) | ORAL | Status: DC

## 2012-10-22 MED ORDER — DIPHENHYDRAMINE HCL 25 MG PO CAPS: 25.0000 mg | ORAL_CAPSULE | Freq: Four times a day (QID) | ORAL | Status: DC | PRN

## 2012-10-22 NOTE — Evaluation (Signed)
Occupational Therapy Evaluation Patient Details Name: [name] MRN: [id number] DOB: 31-Aug-1960 Today's Date: 10/22/2012 Time: 0752-0820 OT Time Calculation (min): 28 min  OT Assessment / Plan / Recommendation Clinical Impression  Pt is 52 y/o female s/p R TKA whom presents as Min assist for LB bathing, and supervision level LB dressing. Pt should benefit from 3:1 for home use and plans to d/c home w/ significant other and family PRN assist. Recommend 24hr supervision/Assist. She demonstrated supervision level transfers and was instructed in safety, sequencing ,as well as hand placement . Pt reports no further OT needs at this time, will sign off.    OT Assessment  Patient does not need any further OT services    Follow Up Recommendations  No OT follow up    Barriers to Discharge      Equipment Recommendations  3 in 1 bedside comode    Recommendations for Other Services    Frequency       Precautions / Restrictions Precautions Precautions: Knee Restrictions Weight Bearing Restrictions: Yes RLE Weight Bearing: Weight bearing as tolerated   Pertinent Vitals/Pain Pt states "It feels better when I'm up and walking" R knee. Did not rate pain when asked.    ADL  Eating/Feeding: Performed;Independent Where Assessed - Eating/Feeding: Chair Grooming: Performed;Wash/dry hands;Wash/dry face;Teeth care;Modified independent Where Assessed - Grooming: Unsupported sitting;Other (comment) (At sink) Upper Body Bathing: Set up;Modified independent;Simulated (Pt donned bra/gown) Where Assessed - Upper Body Bathing: Unsupported sitting Lower Body Bathing: Simulated;Set up;Supervision/safety;Minimal assistance Where Assessed - Lower Body Bathing: Unsupported sitting;Supported sit to stand Upper Body Dressing: Performed;Modified independent;Set up (Pt donned bra and gown) Where Assessed - Upper Body Dressing: Unsupported sitting;Supported sitting Lower Body Dressing:  Performed;Supervision/safety;Set up;Modified independent (Pt donned socks sitting and shorts w/o a/e) Where Assessed - Lower Body Dressing: Unsupported sitting;Supported sit to stand (VC's for safety) Toilet Transfer: Performed;Supervision/safety (Pt amb into bathroom on.off 3:1 over toilet) Toilet Transfer Method: Sit to stand (VC's for RW use and safety, hand placement) Toilet Transfer Equipment: Raised toilet seat with arms (or 3-in-1 over toilet);Grab bars Toileting - Clothing Manipulation and Hygiene: Performed;Supervision/safety;Modified independent Where Assessed - Glass blower/designer Manipulation and Hygiene: Sit on 3-in-1 or toilet Tub/Shower Transfer Method: Not assessed Equipment Used: Rolling walker Transfers/Ambulation Related to ADLs: Pt required VC's for safety and sequencing w/ RW during functional mobility at supervision level. Pt tends to want to lift RW while ambulating. Pt also given VC's for transfers related to hand placement and controlled sit from standing position. ADL Comments: Pt is 52 y/o female s/p R TKA whom presents as Min assist for LB bathing, and supervision level LB dressing. Pt should benefit from 3:1 for home use and plans to d/c home w/ significant other and family PRN assist. She demonstrated supervision level transfers and was instructed in safety, sequencing ,as well as hand placement . Pt reports no further OT needs at this time.    OT Diagnosis:    OT Problem List:   OT Treatment Interventions:     OT Goals    Visit Information  Last OT Received On: 10/22/12 Assistance Needed: +1    Subjective Data  Subjective: "I have a big boyfriend and my family who will all help me at home" Patient Stated Goal: Home with family/sig other assist PRN   Prior Functioning     Home Living Lives With: Significant other Available Help at Discharge: Family;Available PRN/intermittently Type of Home: House Home Access: Stairs to enter [employer] of  Steps:  2 Entrance Stairs-Rails: None Home Layout: One level Bathroom Shower/Tub: Forensic scientist: Standard Bathroom Accessibility: Yes How Accessible: Accessible via walker Home Adaptive Equipment: None Prior Function Level of Independence: Independent Driving: Yes Vocation: On disability Communication Communication: No difficulties Dominant Hand: Right    Vision/Perception Vision - History Baseline Vision: Other (comment) (Bicfocals for night, bifocals for distance & reading glasses) Visual History: Glaucoma Patient Visual Report: No change from baseline   Cognition  Cognition Overall Cognitive Status: Appears within functional limits for tasks assessed/performed Arousal/Alertness: Awake/alert Orientation Level: Appears intact for tasks assessed Behavior During Session: [facility] for tasks performed        Mobility   Pt was I bed mobility.       Balance Balance Balance Assessed: Yes Static Sitting Balance Static Sitting - Balance Support: No upper extremity supported;Feet supported Static Standing Balance Static Standing - Balance Support: During functional activity;Bilateral upper extremity supported   End of Session OT - End of Session Equipment Utilized During Treatment: Other (comment) (RW) Activity Tolerance: Patient tolerated treatment well Patient left: in chair;with call bell/phone within reach  GO     Alm Bustard 10/22/2012, 8:43 AM

## 2012-10-22 NOTE — Care Management Note (Addendum)
    Page 1 of 2   10/22/2012     2:31:37 PM   CARE MANAGEMENT NOTE 10/22/2012  Patient:  [name], [name]   Account Number:  [id number]  Date Initiated:  10/21/2012  Documentation initiated by:  Lanier Clam  Subjective/Objective Assessment:   ADMITTED W/R KNEE OA PAIN.     Action/Plan:   FROM HOME W/SPOUSE.HAS PCP,PHARMACY.   Anticipated DC Date:  10/22/2012   Anticipated DC Plan:  HOME W HOME HEALTH SERVICES      DC Planning Services  CM consult      Choice offered to / List presented to:  C-1 Patient   DME arranged  Levan Hurst      DME agency  [facility]     HH arranged  HH-2 PT      [facility] agency  [facility]   Status of service:  Completed, signed off Medicare Important Message given?  NA - LOS <3 / Initial given by admissions (If response is "NO", the following Medicare IM given date fields will be blank) Date Medicare IM given:   Date Additional Medicare IM given:    Discharge Disposition:  HOME W HOME HEALTH SERVICES  Per UR Regulation:  Reviewed for med. necessity/level of care/duration of stay  If discussed at Long Length of Stay Meetings, dates discussed:    Comments:  10/22/12 KATHY MAHABIR RN,BSN NCM [phone] SPOKE TO PATIENT ABOUT ELEVATED TOILET SEAT,PER [facility] [place](REP) THEY DO NOT HAVE IN HOSPITAL,CAN PICK UP DME ITEM @ [address] STORE,OUT OF POCKET EXPENSE,PROVIDED PATIENT W/ADDRESS,SHE WILL P/U ON WAY HOME.[facility] DME RW BROUGHT TO RM.[facility](REP) AWARE OF HHPT ORDER, & D/C.  10/21/12 KATHY MAHABIR RN,BSN NCM [phone] S/P R TKA.AWAIT PT/OT CONS.[facility] ALREADY FOLLOWING PTA-HHPT,RW,3N1 ORDERED.

## 2012-10-22 NOTE — Progress Notes (Signed)
   Subjective: 1 Day Post-Op Procedure(s) (LRB): TOTAL KNEE ARTHROPLASTY (Right)   Patient reports pain as mild, pain well controlled. No events throughout the night. Ready to be discharged with home health PT.  Objective:   VITALS:   Filed Vitals:   10/22/12 0450  BP: 130/78  Pulse: 59  Temp: 98 F (36.7 C)  Resp: 16    Neurovascular intact Dorsiflexion/Plantar flexion intact Incision: dressing C/D/I No cellulitis present Compartment soft  LABS  Recent Labs  10/22/12 0500  HGB 10.8*  HCT 32.1*  WBC 14.3*  PLT 218     Recent Labs  10/22/12 0500  NA 135  K 3.9  BUN 9  CREATININE 0.72  GLUCOSE 131*     Assessment/Plan: 1 Day Post-Op Procedure(s) (LRB): TOTAL KNEE ARTHROPLASTY (Right) HV drain d/c'ed Foley cath d/c'ed Advance diet Up with therapy D/C IV fluids Discharge home with home health  Expected ABLA  Treated with iron and will observe  Obese (BMI 30-39.9) Estimated body mass index is 34.31 kg/(m^2) as calculated from the following:   Height as of this encounter: 5\' 4"  (1.626 m).   Weight as of this encounter: 90.719 kg (200 lb). Patient also counseled that weight may inhibit the healing process Patient counseled that losing weight will help with future health issues     [name]. Babish   PAC  10/22/2012, 12:13 PM

## 2012-10-22 NOTE — Progress Notes (Signed)
Physical Therapy Treatment Patient Details Name: [name] MRN: [id number] DOB: 02-19-1961 Today's Date: 10/22/2012 Time: 1478-2956 PT Time Calculation (min): 27 min  PT Assessment / Plan / Recommendation Comments on Treatment Session  Pt ambulated in hallway, practiced stairs, and performed exercises to prepare for d/c home today.  Pt reports family will assist her home.  Pt given handouts on exercises and stair technique and had no questions/concerns regarding d/c home.    Follow Up Recommendations  Home health PT     Does the patient have the potential to tolerate intense rehabilitation     Barriers to Discharge        Equipment Recommendations  Rolling walker with 5" wheels;Other (comment) (BSC)    Recommendations for Other Services    Frequency     Plan Discharge plan remains appropriate;Frequency remains appropriate    Precautions / Restrictions Precautions Precautions: Knee Restrictions RLE Weight Bearing: Weight bearing as tolerated   Pertinent Vitals/Pain Premedicated, ice applied to knee after session    Mobility  Bed Mobility Bed Mobility: Sit to Supine Supine to Sit: 6: Modified independent (Device/Increase time) Details for Bed Mobility Assistance: sitting EOB on arrival, uses UEs to assist R LE onto bed Transfers Transfers: Sit to Stand;Stand to Sit Sit to Stand: 5: Supervision;With upper extremity assist Stand to Sit: 5: Supervision;With upper extremity assist Details for Transfer Assistance: verbal cues for safe technique including have RW in front of pt Ambulation/Gait Ambulation/Gait Assistance: 5: Supervision Ambulation Distance (Feet): 400 Feet Assistive device: Rolling walker Ambulation/Gait Assistance Details: verbal cues for heel strike and knee extension Gait Pattern: Step-through pattern;Antalgic Stairs: Yes Stairs Assistance: 4: Min guard Stairs Assistance Details (indicate cue type and reason): verbal cues for sequence and safety, RW  placement, performed x2, pt given handout and agreed to have person assisting her into home look over it prior to d/c. Stair Management Technique: Step to pattern;Backwards;With walker Number of Stairs: 2    Exercises Total Joint Exercises Quad Sets: AROM;Right;20 reps Short Arc Quad: AROM;Right;20 reps Heel Slides: AROM;Right;20 reps;Seated;Supine Hip ABduction/ADduction: AROM;Right;20 reps Straight Leg Raises: AROM;Right;10 reps Goniometric ROM: approx 100* AROM knee flexion seated   PT Diagnosis:    PT Problem List:   PT Treatment Interventions:     PT Goals Acute Rehab PT Goals PT Goal: Supine/Side to Sit - Progress: Progressing toward goal PT Goal: Sit to Stand - Progress: Progressing toward goal PT Goal: Stand to Sit - Progress: Progressing toward goal PT Goal: Ambulate - Progress: Progressing toward goal PT Goal: Up/Down Stairs - Progress: Progressing toward goal PT Goal: Perform Home Exercise Program - Progress: Progressing toward goal  Visit Information  Last PT Received On: 10/22/12 Assistance Needed: +1    Subjective Data  Subjective: I've been ready to walk.  I'm not one to sit still.   Cognition  Cognition Overall Cognitive Status: Appears within functional limits for tasks assessed/performed Arousal/Alertness: Awake/alert Orientation Level: Appears intact for tasks assessed Behavior During Session: [facility] for tasks performed    Balance     End of Session PT - End of Session Activity Tolerance: Patient tolerated treatment well Patient left: in bed;with call bell/phone within reach   GP     LEMYRE,KATHrine E 10/22/2012, 1:17 PM Zenovia Jarred, PT, DPT 10/22/2012 Pager: [phone]

## 2012-10-22 NOTE — Progress Notes (Signed)
Pt's HR dropped down into the upper 30s-40s. Pt asymptomatic all other VS stable, paged PA on call. No new orders given at this time. Will continue to monitor pt.

## 2012-10-23 NOTE — Discharge Summary (Signed)
Physician Discharge Summary  Patient ID: [name] MRN: [id number] DOB/AGE: 24-Apr-1961 52 y.o.  Admit date: 10/21/2012 Discharge date: 10/22/2012   Procedures:  Procedure(s) (LRB): TOTAL KNEE ARTHROPLASTY (Right)  Attending Physician:  Dr. Durene Romans   Admission Diagnoses:   Right knee OA / pain  Discharge Diagnoses:  Principal Problem:   S/P right TKA Active Problems:   Expected blood loss anemia   Obesity  Diagnosis  . Coronary artery disease  . MI (myocardial infarction)  . Borderline diabetic  . Hypertension  . High cholesterol  . COPD (chronic obstructive pulmonary disease)  . Asthma  . Bipolar 1 disorder  . Fibroids  . Sleep apnea  . Complication of anesthesia  . Heart murmur  . Chronic kidney disease - LEFT RENAL CYST  . Urinary, incontinence, stress female  . Arthritis - OA KNEES, SHOULDERS;  DJD   . Anemia  . Hyperlipidemia  . Peripheral vascular disease - CAROTID ARTERY BLOCKAGES-MONITORED BY DR. Cherly Hensen CAROTID SURGERY    PT STATES SHE WAS TOLD SHE HAS"BLOOD CLOTS ALL OVER HER BODY"   . Glaucoma - BOTH EYES    HPI:   [name], 52 y.o. female, has a history of pain and functional disability in the right knee due to arthritis and has failed non-surgical conservative treatments for greater than 12 weeks to includeNSAID's and/or analgesics, corticosteriod injections, viscosupplementation injections and activity modification. Onset of symptoms was gradual, starting >10 years ago with gradually worsening course since that time. The patient noted prior procedures on the knee to include arthroscopy on the right knee(s). Patient currently rates pain in the right knee(s) at 10 out of 10 with activity. Patient has worsening of pain with activity and weight bearing, pain that interferes with activities of daily living, pain with passive range of motion, crepitus and joint swelling. Patient has evidence of periarticular osteophytes and joint space  narrowing by imaging studies. There is no active infection. Risks, benefits and expectations were discussed with the patient. Patient understand the risks, benefits and expectations and wishes to proceed with surgery.   PCP: Default, Provider, MD   Discharged Condition: good  Hospital Course:  Patient underwent the above stated procedure on 10/21/2012. Patient tolerated the procedure well and brought to the recovery room in good condition and subsequently to the floor.  POD #1 BP: 130/78 ; Pulse: 59 ; Temp: 98 F (36.7 C) ; Resp: 16 Pt's foley was removed, as well as the hemovac drain removed. IV was changed to a saline lock. Patient reports pain as mild, pain well controlled. No events throughout the night. Ready to be discharged with home health PT. Neurovascular intact, dorsiflexion/plantar flexion intact, incision: dressing C/D/I, no cellulitis present and compartment soft.   LABS  Basename  10/22/12    0500   HGB  10.8  HCT  32.1    Discharge Exam: General appearance: alert, cooperative and no distress Extremities: Homans sign is negative, no sign of DVT, no edema, redness or tenderness in the calves or thighs and no ulcers, gangrene or trophic changes  Disposition:   Home-Health Care Svc with follow up in 2 weeks   Follow-up Information   Follow up with Shelda Pal, MD. Schedule an appointment as soon as possible for a visit in 2 weeks.   Contact information:   [address] [place] [address] [phone]       Discharge Orders   Future Appointments Provider Department Dept Phone   11/04/2012  2:45 PM Catalina Antigua, MD [facility] [phone]   Future Orders Complete By Expires     Call MD / Call 911  As directed     Comments:      If you experience chest pain or shortness of breath, CALL 911 and be transported to the hospital emergency room.  If you develope a fever above 101 F, pus (white drainage) or increased drainage or redness at  the wound, or calf pain, call your surgeon's office.    Change dressing  As directed     Comments:      Maintain surgical dressing for 10-14 days, then change the dressing daily with sterile 4 x 4 inch gauze dressing and tape. Keep the area dry and clean.    Constipation Prevention  As directed     Comments:      Drink plenty of fluids.  Prune juice may be helpful.  You may use a stool softener, such as Colace (over the counter) 100 mg twice a day.  Use MiraLax (over the counter) for constipation as needed.    Diet - low sodium heart healthy  As directed     Discharge instructions  As directed     Comments:      Maintain surgical dressing for 10-14 days, then replace with gauze and tape. Keep the area dry and clean until follow up. Follow up in 2 weeks at [facility]. Call with any questions or concerns.    Driving restrictions  As directed     Comments:      No driving for 4 weeks    Increase activity slowly as tolerated  As directed     TED hose  As directed     Comments:      Use stockings (TED hose) for 2 weeks on both leg(s).  You may remove them at night for sleeping.    Weight bearing as tolerated  As directed          Medication List    TAKE these medications       albuterol 108 (90 BASE) MCG/ACT inhaler  Commonly known as:  PROVENTIL HFA;VENTOLIN HFA  Inhale 2 puffs into the lungs every 6 (six) hours as needed for wheezing.     aspirin 81 MG tablet  Take 81 mg by mouth daily.     atorvastatin 80 MG tablet  Commonly known as:  LIPITOR  Take 80 mg by mouth at bedtime.     brimonidine 0.15 % ophthalmic solution  Commonly known as:  ALPHAGAN  Place 1 drop into both eyes 2 (two) times daily.     buPROPion 150 MG 24 hr tablet  Commonly known as:  WELLBUTRIN XL  Take 150 mg by mouth daily before breakfast.     clonazePAM 0.5 MG tablet  Commonly known as:  KLONOPIN  Take 0.5 mg by mouth 2 (two) times daily.     clopidogrel 75 MG tablet  Commonly known  as:  PLAVIX  Take 75 mg by mouth daily before breakfast.     diphenhydrAMINE 25 mg capsule  Commonly known as:  BENADRYL  Take 1 capsule (25 mg total) by mouth every 6 (six) hours as needed for itching, allergies or sleep.     DSS 100 MG Caps  Take 100 mg by mouth 2 (two) times daily.     ferrous sulfate 325 (65 FE) MG tablet  Take 1 tablet (325 mg total) by mouth 3 (three) times daily after  meals.     Fluticasone-Salmeterol 250-50 MCG/DOSE Aepb  Commonly known as:  ADVAIR  Inhale 1 puff into the lungs daily before breakfast.     HYDROcodone-acetaminophen 7.5-325 MG per tablet  Commonly known as:  NORCO  Take 1-2 tablets by mouth every 4 (four) hours as needed for pain.     isosorbide dinitrate 30 MG tablet  Commonly known as:  ISORDIL  Take 30 mg by mouth daily. IN AM     isosorbide mononitrate 30 MG 24 hr tablet  Commonly known as:  IMDUR  Take 30 mg by mouth 2 (two) times daily.     lamoTRIgine 100 MG tablet  Commonly known as:  LAMICTAL  Take 100 mg by mouth every evening.     lamoTRIgine 25 MG tablet  Commonly known as:  LAMICTAL  Take 50 mg by mouth every evening. Takes 2 tablets along with a 100 mg tablet to equal 150 mg     lisinopril 20 MG tablet  Commonly known as:  PRINIVIL,ZESTRIL  Take 10 mg by mouth daily before breakfast.     methocarbamol 500 MG tablet  Commonly known as:  ROBAXIN  Take 1 tablet (500 mg total) by mouth every 6 (six) hours as needed.     metoprolol tartrate 25 MG tablet  Commonly known as:  LOPRESSOR  Take 0.5 tablets (12.5 mg total) by mouth 2 (two) times daily.     niacin 1000 MG CR tablet  Commonly known as:  NIASPAN  Take 1,000 mg by mouth at bedtime.     nitroGLYCERIN 0.4 MG SL tablet  Commonly known as:  NITROSTAT  Place 0.4 mg under the tongue every 5 (five) minutes as needed.     omega-3 acid ethyl esters 1 G capsule  Commonly known as:  LOVAZA  Take 1 g by mouth 2 (two) times daily.     oxybutynin 10 MG 24 hr tablet   Commonly known as:  DITROPAN-XL  Take 10 mg by mouth daily. TAKES IN AM     pantoprazole 40 MG tablet  Commonly known as:  PROTONIX  Take 40 mg by mouth daily.     polyethylene glycol packet  Commonly known as:  MIRALAX / GLYCOLAX  Take 17 g by mouth 2 (two) times daily.     traZODone 100 MG tablet  Commonly known as:  DESYREL  Take 100-200 mg by mouth at bedtime.     vitamin B-12 100 MCG tablet  Commonly known as:  CYANOCOBALAMIN  Take 50 mcg by mouth daily.     vitamin C 500 MG tablet  Commonly known as:  ASCORBIC ACID  Take 500 mg by mouth daily.         Signed: Anastasio Auerbach. Babish   PAC  10/23/2012, 9:40 AM

## 2012-11-04 ENCOUNTER — Encounter: Payer: Medicare Other | Admitting: Obstetrics and Gynecology

## 2012-11-08 ENCOUNTER — Encounter (HOSPITAL_COMMUNITY): Payer: Self-pay | Admitting: Emergency Medicine

## 2012-11-08 ENCOUNTER — Emergency Department (HOSPITAL_COMMUNITY)
Admission: EM | Admit: 2012-11-08 | Discharge: 2012-11-09 | Disposition: A | Discharge status: Home / Self Care | Payer: Medicare Other | Attending: Emergency Medicine | Admitting: Emergency Medicine

## 2012-11-08 ENCOUNTER — Emergency Department (HOSPITAL_COMMUNITY): Payer: Medicare Other

## 2012-11-08 DIAGNOSIS — Z8679 Personal history of other diseases of the circulatory system: Secondary | ICD-10-CM | POA: Insufficient documentation

## 2012-11-08 DIAGNOSIS — Z7982 Long term (current) use of aspirin: Secondary | ICD-10-CM | POA: Insufficient documentation

## 2012-11-08 DIAGNOSIS — G4730 Sleep apnea, unspecified: Secondary | ICD-10-CM | POA: Insufficient documentation

## 2012-11-08 DIAGNOSIS — N189 Chronic kidney disease, unspecified: Secondary | ICD-10-CM | POA: Insufficient documentation

## 2012-11-08 DIAGNOSIS — R197 Diarrhea, unspecified: Secondary | ICD-10-CM | POA: Insufficient documentation

## 2012-11-08 DIAGNOSIS — E785 Hyperlipidemia, unspecified: Secondary | ICD-10-CM | POA: Insufficient documentation

## 2012-11-08 DIAGNOSIS — R112 Nausea with vomiting, unspecified: Secondary | ICD-10-CM

## 2012-11-08 DIAGNOSIS — I2510 Atherosclerotic heart disease of native coronary artery without angina pectoris: Secondary | ICD-10-CM | POA: Insufficient documentation

## 2012-11-08 DIAGNOSIS — F17200 Nicotine dependence, unspecified, uncomplicated: Secondary | ICD-10-CM | POA: Insufficient documentation

## 2012-11-08 DIAGNOSIS — D649 Anemia, unspecified: Secondary | ICD-10-CM | POA: Insufficient documentation

## 2012-11-08 DIAGNOSIS — Z9861 Coronary angioplasty status: Secondary | ICD-10-CM | POA: Insufficient documentation

## 2012-11-08 DIAGNOSIS — I129 Hypertensive chronic kidney disease with stage 1 through stage 4 chronic kidney disease, or unspecified chronic kidney disease: Secondary | ICD-10-CM | POA: Insufficient documentation

## 2012-11-08 DIAGNOSIS — Z862 Personal history of diseases of the blood and blood-forming organs and certain disorders involving the immune mechanism: Secondary | ICD-10-CM | POA: Insufficient documentation

## 2012-11-08 DIAGNOSIS — R109 Unspecified abdominal pain: Secondary | ICD-10-CM

## 2012-11-08 DIAGNOSIS — R011 Cardiac murmur, unspecified: Secondary | ICD-10-CM | POA: Insufficient documentation

## 2012-11-08 DIAGNOSIS — J4489 Other specified chronic obstructive pulmonary disease: Secondary | ICD-10-CM | POA: Insufficient documentation

## 2012-11-08 DIAGNOSIS — M129 Arthropathy, unspecified: Secondary | ICD-10-CM | POA: Insufficient documentation

## 2012-11-08 DIAGNOSIS — IMO0002 Reserved for concepts with insufficient information to code with codable children: Secondary | ICD-10-CM | POA: Insufficient documentation

## 2012-11-08 DIAGNOSIS — Z87448 Personal history of other diseases of urinary system: Secondary | ICD-10-CM | POA: Insufficient documentation

## 2012-11-08 DIAGNOSIS — Z79899 Other long term (current) drug therapy: Secondary | ICD-10-CM | POA: Insufficient documentation

## 2012-11-08 DIAGNOSIS — Z8639 Personal history of other endocrine, nutritional and metabolic disease: Secondary | ICD-10-CM | POA: Insufficient documentation

## 2012-11-08 DIAGNOSIS — I252 Old myocardial infarction: Secondary | ICD-10-CM | POA: Insufficient documentation

## 2012-11-08 DIAGNOSIS — Z8742 Personal history of other diseases of the female genital tract: Secondary | ICD-10-CM | POA: Insufficient documentation

## 2012-11-08 DIAGNOSIS — J449 Chronic obstructive pulmonary disease, unspecified: Secondary | ICD-10-CM | POA: Insufficient documentation

## 2012-11-08 DIAGNOSIS — R634 Abnormal weight loss: Secondary | ICD-10-CM | POA: Insufficient documentation

## 2012-11-08 DIAGNOSIS — H409 Unspecified glaucoma: Secondary | ICD-10-CM | POA: Insufficient documentation

## 2012-11-08 DIAGNOSIS — F319 Bipolar disorder, unspecified: Secondary | ICD-10-CM | POA: Insufficient documentation

## 2012-11-08 DIAGNOSIS — R6883 Chills (without fever): Secondary | ICD-10-CM | POA: Insufficient documentation

## 2012-11-08 DIAGNOSIS — E7800 Pure hypercholesterolemia, unspecified: Secondary | ICD-10-CM | POA: Insufficient documentation

## 2012-11-08 LAB — CG4 I-STAT (LACTIC ACID): Lactic Acid, Venous: 1.82 mmol/L (ref 0.5–2.2)

## 2012-11-08 MED ORDER — ONDANSETRON HCL 4 MG/2ML IJ SOLN
4.0000 mg | Freq: Once | INTRAMUSCULAR | Status: AC
  Administered 2012-11-08: 4 mg via INTRAVENOUS
  Filled 2012-11-08: qty 2

## 2012-11-08 MED ORDER — HYDROMORPHONE HCL PF 1 MG/ML IJ SOLN
1.0000 mg | Freq: Once | INTRAMUSCULAR | Status: AC
  Administered 2012-11-08: 1 mg via INTRAVENOUS
  Filled 2012-11-08: qty 1

## 2012-11-08 MED ORDER — PANTOPRAZOLE SODIUM 40 MG IV SOLR
40.0000 mg | Freq: Once | INTRAVENOUS | Status: AC
  Administered 2012-11-08: 40 mg via INTRAVENOUS
  Filled 2012-11-08: qty 40

## 2012-11-08 MED ORDER — SODIUM CHLORIDE 0.9 % IV BOLUS (SEPSIS)
1000.0000 mL | Freq: Once | INTRAVENOUS | Status: AC
  Administered 2012-11-08: 1000 mL via INTRAVENOUS

## 2012-11-08 NOTE — ED Provider Notes (Signed)
History     CSN: [id number]  Arrival date & time 11/08/12  2113   First MD Initiated Contact with Patient 11/08/12 2249      Chief Complaint  Patient presents with  . Emesis  . Diarrhea    (Consider location/radiation/quality/duration/timing/severity/associated sxs/prior treatment) HPI Comments: Pt had a knee replacement on March 6th (right knee, Dr. Charlann Boxer), was in the hospital for a few days, has been at home recuperating.  She was having mood swings, despite h/o Bipolar, she was concerned that her narcotic doses was contributing and so she stopped taking her hydrocodone and muscle relaxant "cold [place]."  She doesn't take chronic pain meds, was taking 1-2 tablets up to 3-4 times per day.  Last dose was about 4 days ago . She began having N/V/D, chills, but didn't begin having abd pain, diffuse periumbilical location, sharp in nature in the past 1-2 days.   She has had numerous episodes of both, watery from both ends.  Trying to eat or drink makes the vomiting and diarrhea worse.  She drank coffee this AM and had watery diarrhea. She has h/o CAD as well s/p CABG, also has had 2 C/S's in the past.  She notes weight loss and poor appetite over months.  She has been more depressed recently since her knee surgery.    Patient is a 52 y.o. female presenting with vomiting and diarrhea. The history is provided by the patient, a friend and medical records.  Emesis Associated symptoms: abdominal pain, chills and diarrhea   Diarrhea Associated symptoms: abdominal pain, chills and vomiting   Associated symptoms: no fever     Past Medical History  Diagnosis Date  . Coronary artery disease   . MI (myocardial infarction)   . Borderline diabetic     PT STATES NOT DIABETIC--DID HAVE GESTATIONAL DIABETES  . Hypertension   . High cholesterol   . COPD (chronic obstructive pulmonary disease)   . Asthma   . Bipolar 1 disorder   . Fibroids     UTERINE FIBROIDS--PT STATES VAGINAL BLEEDING OFF AND ON  WHICH SHE RELATES TO HER FIBROIDS-  . Sleep apnea     DOES NOT USE CPAP--HAS NASAL MASK AT HOME  . Complication of anesthesia     CLAUSTROPHOBIA-STATES DOES NOT TOLERATE NASAL CPAP  . Heart murmur   . Chronic kidney disease     LEFT RENAL CYST  . Urinary, incontinence, stress female   . Arthritis     OA KNEES, SHOULDERS;  DJD   . Anemia   . Hyperlipidemia   . Peripheral vascular disease     CAROTID ARTERY BLOCKAGES-MONITORED BY DR. Cherly Hensen CAROTID SURGERY    PT STATES SHE WAS TOLD SHE HAS"BLOOD CLOTS ALL OVER HER BODY"   . Glaucoma     BOTH EYES    Past Surgical History  Procedure Laterality Date  . Cardiac surgery    . Cardiac stents    . Coronary artery bypass graft  2008  . Pinning left foot    . C-sections x 2    . 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 Right knee arthroscopy    . Eye surgery      MULTIPLE LASER SURGERIES LEFT EYE BECAUSE OF GLAUCOMA  . Total knee arthroplasty Right 10/21/2012    Procedure: TOTAL KNEE ARTHROPLASTY;  Surgeon: Shelda Pal, MD;  Location: [facility];  Service: Orthopedics;  Laterality:  Right;    History reviewed. No pertinent family history.  History  Substance Use Topics  . Smoking status: Current Every Day Smoker -- 0.50 packs/day for 25 years    Types: Cigarettes  . Smokeless tobacco: Never Used  . Alcohol Use: No    OB History   Grav Para Term Preterm Abortions TAB SAB Ect Mult Living                  Review of Systems  Constitutional: Positive for chills, appetite change and unexpected weight change. Negative for fever.  Respiratory: Negative for cough and shortness of breath.   Gastrointestinal: Positive for nausea, vomiting, abdominal pain and diarrhea. Negative for blood in stool.  Endocrine: Negative for polydipsia and polyuria.  Genitourinary: Negative for dysuria, flank pain, vaginal bleeding and vaginal discharge.  All other systems reviewed and are  negative.    Allergies  Haldol and Amoxicillin  Home Medications   Current Outpatient Rx  Name  Route  Sig  Dispense  Refill  . albuterol (PROVENTIL HFA;VENTOLIN HFA) 108 (90 BASE) MCG/ACT inhaler   Inhalation   Inhale 2 puffs into the lungs every 6 (six) hours as needed for wheezing.         Marland Kitchen aspirin 325 MG tablet   Oral   Take 325 mg by mouth daily.         Marland Kitchen aspirin 81 MG tablet   Oral   Take 81 mg by mouth daily.         Marland Kitchen atorvastatin (LIPITOR) 80 MG tablet   Oral   Take 80 mg by mouth at bedtime.          . brimonidine (ALPHAGAN) 0.15 % ophthalmic solution   Both Eyes   Place 1 drop into both eyes 2 (two) times daily.         Marland Kitchen buPROPion (WELLBUTRIN XL) 150 MG 24 hr tablet   Oral   Take 150 mg by mouth daily before breakfast.          . clonazePAM (KLONOPIN) 0.5 MG tablet   Oral   Take 0.5 mg by mouth 2 (two) times daily.          . clopidogrel (PLAVIX) 75 MG tablet   Oral   Take 75 mg by mouth daily before breakfast.          . diphenhydrAMINE (BENADRYL) 25 mg capsule   Oral   Take 1 capsule (25 mg total) by mouth every 6 (six) hours as needed for itching, allergies or sleep.   30 capsule      . docusate sodium 100 MG CAPS   Oral   Take 100 mg by mouth 2 (two) times daily.   10 capsule      . Ensure Plus (ENSURE PLUS) LIQD   Oral   Take 237 mLs by mouth 2 (two) times daily between meals.         . ferrous sulfate 325 (65 FE) MG tablet   Oral   Take 1 tablet (325 mg total) by mouth 3 (three) times daily after meals.         . Fluticasone-Salmeterol (ADVAIR) 250-50 MCG/DOSE AEPB   Inhalation   Inhale 1 puff into the lungs daily before breakfast.         . isosorbide mononitrate (IMDUR) 30 MG 24 hr tablet   Oral   Take 30 mg by mouth 2 (two) times daily.         Marland Kitchen  lamoTRIgine (LAMICTAL) 100 MG tablet   Oral   Take 100 mg by mouth every evening.          . lamoTRIgine (LAMICTAL) 25 MG tablet   Oral   Take 50 mg  by mouth every evening. Takes 2 tablets along with a 100 mg tablet to equal 150 mg         . lisinopril (PRINIVIL,ZESTRIL) 20 MG tablet   Oral   Take 10 mg by mouth daily before breakfast.          . metoprolol tartrate (LOPRESSOR) 25 MG tablet   Oral   Take 0.5 tablets (12.5 mg total) by mouth 2 (two) times daily.           To hold if heart rate drops too much. She monitors ...   . niacin (NIASPAN) 1000 MG CR tablet   Oral   Take 1,000 mg by mouth at bedtime.         . nitroGLYCERIN (NITROSTAT) 0.4 MG SL tablet   Sublingual   Place 0.4 mg under the tongue every 5 (five) minutes as needed.         Marland Kitchen omega-3 acid ethyl esters (LOVAZA) 1 G capsule   Oral   Take 1 g by mouth 2 (two) times daily.         Marland Kitchen oxybutynin (DITROPAN-XL) 10 MG 24 hr tablet   Oral   Take 10 mg by mouth daily. TAKES IN AM         . pantoprazole (PROTONIX) 40 MG tablet   Oral   Take 40 mg by mouth daily.         . traZODone (DESYREL) 100 MG tablet   Oral   Take 100-200 mg by mouth at bedtime.         . vitamin B-12 (CYANOCOBALAMIN) 100 MCG tablet   Oral   Take 50 mcg by mouth daily.         . vitamin C (ASCORBIC ACID) 500 MG tablet   Oral   Take 500 mg by mouth daily.           BP 126/64  Pulse 78  Temp(Src) 99.3 F (37.4 C)  Resp 20  SpO2 99%  LMP 11/16/2011  Physical Exam  Nursing note and vitals reviewed. Constitutional: She appears well-developed and well-nourished. No distress.  HENT:  Head: Normocephalic and atraumatic.  Eyes: EOM are normal. No scleral icterus.  Neck: Neck supple.  Cardiovascular: Normal rate and regular rhythm.   Pulmonary/Chest: Effort normal. No respiratory distress. She has no wheezes.  Abdominal: Soft. Normal appearance. She exhibits no distension. There is generalized tenderness. There is no rebound, no guarding and no CVA tenderness. No hernia. Hernia confirmed negative in the ventral area.  Neurological: She is alert.  Skin: Skin  is warm and dry. No rash noted. She is not diaphoretic.  Psychiatric: She has a normal mood and affect.    ED Course  Procedures (including critical care time)  Labs Reviewed  CBC WITH DIFFERENTIAL  COMPREHENSIVE METABOLIC PANEL  URINALYSIS, ROUTINE W REFLEX MICROSCOPIC  LIPASE, BLOOD   No results found.   1. Abdominal pain   2. Nausea vomiting and diarrhea     ra sat is 99% and I interpret to be normal.  MDM  Pt with diffuse abd pain and tenderness on exam, N/V/D.  Initially, symptoms could have been from narcotic withdrawal, but I think symptoms should be improving, not worsening.  Will get CT  Scan, check lipse and lactate.  IVF's, IV pain meds, protonix and zofran.  Pt denies drinking alcohol, no drugs.  Will sign out pt to Dr. Bernette Mayers for follow up of CT scan.  I reviewed recent op note and discharge summary in [facility].       Gavin Pound. Oletta Lamas, MD 11/08/12 2324

## 2012-11-08 NOTE — ED Notes (Signed)
Pt. Is reminded for urinalysis.

## 2012-11-08 NOTE — ED Notes (Signed)
I-Stat Lactic Acid result reported to EDP Ghim

## 2012-11-08 NOTE — ED Notes (Signed)
Pt c/o n/v/d x 5 days. Pt states last 48 hours worst, unable to keep any fluids down. PWD.

## 2012-11-09 LAB — URINALYSIS, ROUTINE W REFLEX MICROSCOPIC
Bilirubin Urine: NEGATIVE
Glucose, UA: NEGATIVE mg/dL
Ketones, ur: NEGATIVE mg/dL
Leukocytes, UA: NEGATIVE
Nitrite: NEGATIVE
Protein, ur: 30 mg/dL — AB
Specific Gravity, Urine: 1.026 (ref 1.005–1.030)
Urobilinogen, UA: 0.2 mg/dL (ref 0.0–1.0)
pH: 7 (ref 5.0–8.0)

## 2012-11-09 LAB — CBC WITH DIFFERENTIAL/PLATELET
Basophils Absolute: 0 10*3/uL (ref 0.0–0.1)
Basophils Relative: 0 % (ref 0–1)
Eosinophils Absolute: 0 10*3/uL (ref 0.0–0.7)
Eosinophils Relative: 0 % (ref 0–5)
HCT: 35.8 % — ABNORMAL LOW (ref 36.0–46.0)
Hemoglobin: 12.6 g/dL (ref 12.0–15.0)
Lymphocytes Relative: 18 % (ref 12–46)
Lymphs Abs: 1.8 10*3/uL (ref 0.7–4.0)
MCH: 34.4 pg — ABNORMAL HIGH (ref 26.0–34.0)
MCHC: 35.2 g/dL (ref 30.0–36.0)
MCV: 97.8 fL (ref 78.0–100.0)
Monocytes Absolute: 0.5 10*3/uL (ref 0.1–1.0)
Monocytes Relative: 5 % (ref 3–12)
Neutro Abs: 7.7 10*3/uL (ref 1.7–7.7)
Neutrophils Relative %: 77 % (ref 43–77)
Platelets: 391 10*3/uL (ref 150–400)
RBC: 3.66 MIL/uL — ABNORMAL LOW (ref 3.87–5.11)
RDW: 15.3 % (ref 11.5–15.5)
WBC: 10 10*3/uL (ref 4.0–10.5)

## 2012-11-09 LAB — URINE MICROSCOPIC-ADD ON

## 2012-11-09 LAB — COMPREHENSIVE METABOLIC PANEL
ALT: 12 U/L (ref 0–35)
AST: 21 U/L (ref 0–37)
Albumin: 3.5 g/dL (ref 3.5–5.2)
Alkaline Phosphatase: 123 U/L — ABNORMAL HIGH (ref 39–117)
BUN: 8 mg/dL (ref 6–23)
CO2: 17 mEq/L — ABNORMAL LOW (ref 19–32)
Calcium: 9.4 mg/dL (ref 8.4–10.5)
Chloride: 100 mEq/L (ref 96–112)
Creatinine, Ser: 0.54 mg/dL (ref 0.50–1.10)
GFR calc Af Amer: >90 mL/min (ref >90)
GFR calc non Af Amer: >90 mL/min (ref >90)
Glucose, Bld: 107 mg/dL — ABNORMAL HIGH (ref 70–99)
Potassium: 3.7 mEq/L (ref 3.5–5.1)
Sodium: 135 mEq/L (ref 135–145)
Total Bilirubin: 0.4 mg/dL (ref 0.3–1.2)
Total Protein: 7.4 g/dL (ref 6.0–8.3)

## 2012-11-09 LAB — LIPASE, BLOOD: Lipase: 28 U/L (ref 11–59)

## 2012-11-09 MED ORDER — IOHEXOL 300 MG/ML  SOLN
50.0000 mL | Freq: Once | INTRAMUSCULAR | Status: AC | PRN
  Administered 2012-11-09: 50 mL via ORAL

## 2012-11-09 MED ORDER — ONDANSETRON HCL 4 MG PO TABS: 4.0000 mg | ORAL_TABLET | Freq: Three times a day (TID) | ORAL | Status: DC | PRN

## 2012-11-09 MED ORDER — IOHEXOL 300 MG/ML  SOLN
100.0000 mL | Freq: Once | INTRAMUSCULAR | Status: AC | PRN
  Administered 2012-11-09: 100 mL via INTRAVENOUS

## 2012-11-09 NOTE — ED Provider Notes (Signed)
Care assumed at the change of shift pending CT which is negative for acute process as below. PT feeling much better, urinating and tolerating fluids and would like to go home.   Dg Chest 2 View  10/16/2012  *RADIOLOGY REPORT*  Clinical Data: Preop right total knee arthroplasty  CHEST - 2 VIEW  Comparison: 02/28/2010  Findings: Previous median sternotomy CABG procedure.  Several of the median sternotomy sutures are broken, and there is a wire fragment anteriorly overlying the left heart shadow.  Mild cardiac enlargement.  No effusions or edema. No airspace consolidation.  IMPRESSION:  1.  No acute cardiopulmonary abnormalities.   Original Report Authenticated By: Signa Kell, M.D.    Ct Abdomen Pelvis W Contrast  11/09/2012  *RADIOLOGY REPORT*  Clinical Data: Abdominal pain  CT ABDOMEN AND PELVIS WITH CONTRAST  Technique:  Multidetector CT imaging of the abdomen and pelvis was performed following the standard protocol during bolus administration of intravenous contrast.  Contrast: 50mL OMNIPAQUE IOHEXOL 300 MG/ML  SOLN, OMNIPAQUE IOHEXOL 300 MG/ML  SOLN  Comparison: 05/20/2012  Findings: Lung bases are predominately clear.  Normal heart size.  Low attenuation of the liver suggests fatty infiltration.  Dependent gallstones.  No biliary ductal dilatation.  Unremarkable spleen, pancreas, right adrenal gland.  Left adrenal enlargement without a dominant nodule.  There are a couple hypodensities within the kidneys, similar to prior, too small to further characterize.  Mild fullness of the right renal pelvis and ureter to the level of the iliac vessels. The ureter below this level appears decompressed.  There is symmetric excretion on the delayed phase.  No bowel obstruction.  No CT evidence for colitis.  Short appendix versus appendiceal stump, without periappendiceal inflammation  No free intraperitoneal air or fluid.  No lymphadenopathy.  There is scattered atherosclerotic calcification of the aorta and  its branches. No aneurysmal dilatation.  Partially decompressed bladder.  Unremarkable CT appearance to the uterus and adnexa.  No acute osseous finding.  L5-S1 facet arthropathy  IMPRESSION: Hepatic steatosis.  Gallstones.  No CT evidence for cholecystitis.  Mild fullness of the right renal pelvis is nonspecific. No obstructing lesion is visualized.   Original Report Authenticated By: Jearld Lesch, M.D.     Charles B. Bernette Mayers, MD 11/09/12 [phone]

## 2012-11-10 ENCOUNTER — Emergency Department (HOSPITAL_COMMUNITY): Payer: Medicare Other

## 2012-11-10 ENCOUNTER — Emergency Department (HOSPITAL_COMMUNITY)
Admission: EM | Admit: 2012-11-10 | Discharge: 2012-11-10 | Disposition: A | Discharge status: Home / Self Care | Payer: Medicare Other | Attending: Emergency Medicine | Admitting: Emergency Medicine

## 2012-11-10 ENCOUNTER — Encounter (HOSPITAL_COMMUNITY): Payer: Self-pay | Admitting: Emergency Medicine

## 2012-11-10 DIAGNOSIS — Z8542 Personal history of malignant neoplasm of other parts of uterus: Secondary | ICD-10-CM | POA: Insufficient documentation

## 2012-11-10 DIAGNOSIS — Z9861 Coronary angioplasty status: Secondary | ICD-10-CM | POA: Insufficient documentation

## 2012-11-10 DIAGNOSIS — I252 Old myocardial infarction: Secondary | ICD-10-CM | POA: Insufficient documentation

## 2012-11-10 DIAGNOSIS — J449 Chronic obstructive pulmonary disease, unspecified: Secondary | ICD-10-CM | POA: Insufficient documentation

## 2012-11-10 DIAGNOSIS — Z96659 Presence of unspecified artificial knee joint: Secondary | ICD-10-CM | POA: Insufficient documentation

## 2012-11-10 DIAGNOSIS — E7800 Pure hypercholesterolemia, unspecified: Secondary | ICD-10-CM | POA: Insufficient documentation

## 2012-11-10 DIAGNOSIS — F319 Bipolar disorder, unspecified: Secondary | ICD-10-CM | POA: Insufficient documentation

## 2012-11-10 DIAGNOSIS — J4489 Other specified chronic obstructive pulmonary disease: Secondary | ICD-10-CM | POA: Insufficient documentation

## 2012-11-10 DIAGNOSIS — I2510 Atherosclerotic heart disease of native coronary artery without angina pectoris: Secondary | ICD-10-CM | POA: Insufficient documentation

## 2012-11-10 DIAGNOSIS — R1084 Generalized abdominal pain: Secondary | ICD-10-CM | POA: Insufficient documentation

## 2012-11-10 DIAGNOSIS — H409 Unspecified glaucoma: Secondary | ICD-10-CM | POA: Insufficient documentation

## 2012-11-10 DIAGNOSIS — M129 Arthropathy, unspecified: Secondary | ICD-10-CM | POA: Insufficient documentation

## 2012-11-10 DIAGNOSIS — G4730 Sleep apnea, unspecified: Secondary | ICD-10-CM | POA: Insufficient documentation

## 2012-11-10 DIAGNOSIS — D649 Anemia, unspecified: Secondary | ICD-10-CM | POA: Insufficient documentation

## 2012-11-10 DIAGNOSIS — X58XXXA Exposure to other specified factors, initial encounter: Secondary | ICD-10-CM | POA: Insufficient documentation

## 2012-11-10 DIAGNOSIS — Z8639 Personal history of other endocrine, nutritional and metabolic disease: Secondary | ICD-10-CM | POA: Insufficient documentation

## 2012-11-10 DIAGNOSIS — E785 Hyperlipidemia, unspecified: Secondary | ICD-10-CM | POA: Insufficient documentation

## 2012-11-10 DIAGNOSIS — Z8679 Personal history of other diseases of the circulatory system: Secondary | ICD-10-CM | POA: Insufficient documentation

## 2012-11-10 DIAGNOSIS — S8990XA Unspecified injury of unspecified lower leg, initial encounter: Secondary | ICD-10-CM | POA: Insufficient documentation

## 2012-11-10 DIAGNOSIS — Z87448 Personal history of other diseases of urinary system: Secondary | ICD-10-CM | POA: Insufficient documentation

## 2012-11-10 DIAGNOSIS — R011 Cardiac murmur, unspecified: Secondary | ICD-10-CM | POA: Insufficient documentation

## 2012-11-10 DIAGNOSIS — IMO0002 Reserved for concepts with insufficient information to code with codable children: Secondary | ICD-10-CM | POA: Insufficient documentation

## 2012-11-10 DIAGNOSIS — S99919A Unspecified injury of unspecified ankle, initial encounter: Secondary | ICD-10-CM | POA: Insufficient documentation

## 2012-11-10 DIAGNOSIS — I129 Hypertensive chronic kidney disease with stage 1 through stage 4 chronic kidney disease, or unspecified chronic kidney disease: Secondary | ICD-10-CM | POA: Insufficient documentation

## 2012-11-10 DIAGNOSIS — Z7902 Long term (current) use of antithrombotics/antiplatelets: Secondary | ICD-10-CM | POA: Insufficient documentation

## 2012-11-10 DIAGNOSIS — Y939 Activity, unspecified: Secondary | ICD-10-CM | POA: Insufficient documentation

## 2012-11-10 DIAGNOSIS — N189 Chronic kidney disease, unspecified: Secondary | ICD-10-CM | POA: Insufficient documentation

## 2012-11-10 DIAGNOSIS — Z862 Personal history of diseases of the blood and blood-forming organs and certain disorders involving the immune mechanism: Secondary | ICD-10-CM | POA: Insufficient documentation

## 2012-11-10 DIAGNOSIS — F17200 Nicotine dependence, unspecified, uncomplicated: Secondary | ICD-10-CM | POA: Insufficient documentation

## 2012-11-10 DIAGNOSIS — Z7982 Long term (current) use of aspirin: Secondary | ICD-10-CM | POA: Insufficient documentation

## 2012-11-10 DIAGNOSIS — Z79899 Other long term (current) drug therapy: Secondary | ICD-10-CM | POA: Insufficient documentation

## 2012-11-10 DIAGNOSIS — Y929 Unspecified place or not applicable: Secondary | ICD-10-CM | POA: Insufficient documentation

## 2012-11-10 DIAGNOSIS — R112 Nausea with vomiting, unspecified: Secondary | ICD-10-CM

## 2012-11-10 LAB — CBC WITH DIFFERENTIAL/PLATELET
Basophils Absolute: 0 10*3/uL (ref 0.0–0.1)
Basophils Relative: 0 % (ref 0–1)
Eosinophils Absolute: 0.1 10*3/uL (ref 0.0–0.7)
Eosinophils Relative: 1 % (ref 0–5)
HCT: 38.7 % (ref 36.0–46.0)
Hemoglobin: 13.4 g/dL (ref 12.0–15.0)
Lymphocytes Relative: 17 % (ref 12–46)
Lymphs Abs: 2.3 10*3/uL (ref 0.7–4.0)
MCH: 34.2 pg — ABNORMAL HIGH (ref 26.0–34.0)
MCHC: 34.6 g/dL (ref 30.0–36.0)
MCV: 98.7 fL (ref 78.0–100.0)
Monocytes Absolute: 1 10*3/uL (ref 0.1–1.0)
Monocytes Relative: 7 % (ref 3–12)
Neutro Abs: 10 10*3/uL — ABNORMAL HIGH (ref 1.7–7.7)
Neutrophils Relative %: 74 % (ref 43–77)
Platelets: 438 10*3/uL — ABNORMAL HIGH (ref 150–400)
RBC: 3.92 MIL/uL (ref 3.87–5.11)
RDW: 15.5 % (ref 11.5–15.5)
WBC: 13.4 10*3/uL — ABNORMAL HIGH (ref 4.0–10.5)

## 2012-11-10 LAB — URINALYSIS, ROUTINE W REFLEX MICROSCOPIC
Bilirubin Urine: NEGATIVE
Glucose, UA: NEGATIVE mg/dL
Hgb urine dipstick: NEGATIVE
Ketones, ur: NEGATIVE mg/dL
Leukocytes, UA: NEGATIVE
Nitrite: NEGATIVE
Protein, ur: NEGATIVE mg/dL
Specific Gravity, Urine: 1.024 (ref 1.005–1.030)
Urobilinogen, UA: 0.2 mg/dL (ref 0.0–1.0)
pH: 8 (ref 5.0–8.0)

## 2012-11-10 LAB — URINE MICROSCOPIC-ADD ON

## 2012-11-10 LAB — COMPREHENSIVE METABOLIC PANEL
ALT: 12 U/L (ref 0–35)
AST: 16 U/L (ref 0–37)
Albumin: 3.4 g/dL — ABNORMAL LOW (ref 3.5–5.2)
Alkaline Phosphatase: 109 U/L (ref 39–117)
BUN: 11 mg/dL (ref 6–23)
CO2: 24 mEq/L (ref 19–32)
Calcium: 9.4 mg/dL (ref 8.4–10.5)
Chloride: 97 mEq/L (ref 96–112)
Creatinine, Ser: 0.73 mg/dL (ref 0.50–1.10)
GFR calc Af Amer: >90 mL/min (ref >90)
GFR calc non Af Amer: >90 mL/min (ref >90)
Glucose, Bld: 124 mg/dL — ABNORMAL HIGH (ref 70–99)
Potassium: 3.9 mEq/L (ref 3.5–5.1)
Sodium: 134 mEq/L — ABNORMAL LOW (ref 135–145)
Total Bilirubin: 0.4 mg/dL (ref 0.3–1.2)
Total Protein: 7 g/dL (ref 6.0–8.3)

## 2012-11-10 LAB — LIPASE, BLOOD: Lipase: 36 U/L (ref 11–59)

## 2012-11-10 MED ORDER — HYDROMORPHONE HCL PF 1 MG/ML IJ SOLN
0.5000 mg | Freq: Once | INTRAMUSCULAR | Status: AC
  Administered 2012-11-10: 0.5 mg via INTRAVENOUS
  Filled 2012-11-10: qty 1

## 2012-11-10 MED ORDER — ONDANSETRON HCL 4 MG/2ML IJ SOLN
4.0000 mg | Freq: Once | INTRAMUSCULAR | Status: AC
  Administered 2012-11-10: 4 mg via INTRAVENOUS

## 2012-11-10 MED ORDER — ONDANSETRON HCL 4 MG/2ML IJ SOLN
4.0000 mg | Freq: Once | INTRAMUSCULAR | Status: AC
  Administered 2012-11-10: 4 mg via INTRAVENOUS
  Filled 2012-11-10: qty 2

## 2012-11-10 MED ORDER — HYDROMORPHONE HCL PF 1 MG/ML IJ SOLN
1.0000 mg | Freq: Once | INTRAMUSCULAR | Status: AC
  Administered 2012-11-10: 1 mg via INTRAVENOUS
  Filled 2012-11-10: qty 1

## 2012-11-10 MED ORDER — ONDANSETRON HCL 4 MG/2ML IJ SOLN
INTRAMUSCULAR | Status: AC
  Filled 2012-11-10: qty 2

## 2012-11-10 MED ORDER — PROMETHAZINE HCL 25 MG RE SUPP: 25.0000 mg | Freq: Four times a day (QID) | RECTAL | Status: DC | PRN

## 2012-11-10 MED ORDER — FENTANYL CITRATE 0.05 MG/ML IJ SOLN
INTRAMUSCULAR | Status: AC
  Filled 2012-11-10: qty 2

## 2012-11-10 MED ORDER — SODIUM CHLORIDE 0.9 % IV SOLN
1000.0000 mL | Freq: Once | INTRAVENOUS | Status: AC
  Administered 2012-11-10: 1000 mL via INTRAVENOUS

## 2012-11-10 NOTE — ED Notes (Signed)
Pt is aware of the need for a urine sample.  

## 2012-11-10 NOTE — ED Provider Notes (Signed)
History     CSN: [id number]  Arrival date & time 11/10/12  2130   First MD Initiated Contact with Patient 11/10/12 [phone]      No chief complaint on file.   HPI  Patient with multiple medical problems, and recent right knee repair now presents with terminal nausea, vomiting, by mouth intolerance.  Notably, she was seen here 2 days ago for similar concerns.  She states that following the knee replacement she was improving, but in the past days developed nausea, profound episodes of vomiting. With those concerns she was seen here, received fluids, labs, CT.  After an improvement in her condition she was discharged.  She states that yesterday she was tolerant of 3 individual doses of Ensure liquid nutrition route she had no complaints during that timeframe.  Last night, proximal to lower the patient ate a chicken sandwich.  She subsequently became nauseous, and since then has had multiple episodes of emesis.  There is concurrent diffuse crampy abdominal pain.  No relief with anything.  No concurrent diarrhea, fever, any other focal complaints.  Past Medical History  Diagnosis Date  . Coronary artery disease   . MI (myocardial infarction)   . Borderline diabetic     PT STATES NOT DIABETIC--DID HAVE GESTATIONAL DIABETES  . Hypertension   . High cholesterol   . COPD (chronic obstructive pulmonary disease)   . Asthma   . Bipolar 1 disorder   . Fibroids     UTERINE FIBROIDS--PT STATES VAGINAL BLEEDING OFF AND ON WHICH SHE RELATES TO HER FIBROIDS-  . Sleep apnea     DOES NOT USE CPAP--HAS NASAL MASK AT HOME  . Complication of anesthesia     CLAUSTROPHOBIA-STATES DOES NOT TOLERATE NASAL CPAP  . Heart murmur   . Chronic kidney disease     LEFT RENAL CYST  . Urinary, incontinence, stress female   . Arthritis     OA KNEES, SHOULDERS;  DJD   . Anemia   . Hyperlipidemia   . Peripheral vascular disease     CAROTID ARTERY BLOCKAGES-MONITORED BY DR. Cherly Hensen CAROTID SURGERY    PT STATES SHE  WAS TOLD SHE HAS"BLOOD CLOTS ALL OVER HER BODY"   . Glaucoma     BOTH EYES    Past Surgical History  Procedure Laterality Date  . Cardiac surgery    . Cardiac stents    . Coronary artery bypass graft  2008  . Pinning left foot    . C-sections x 2    . 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 Right knee arthroscopy    . Eye surgery      MULTIPLE LASER SURGERIES LEFT EYE BECAUSE OF GLAUCOMA  . Total knee arthroplasty Right 10/21/2012    Procedure: TOTAL KNEE ARTHROPLASTY;  Surgeon: Shelda Pal, MD;  Location: [facility];  Service: Orthopedics;  Laterality: Right;    No family history on file.  History  Substance Use Topics  . Smoking status: Current Every Day Smoker -- 0.50 packs/day for 25 years    Types: Cigarettes  . Smokeless tobacco: Never Used  . Alcohol Use: No    OB History   Grav Para Term Preterm Abortions TAB SAB Ect Mult Living                  Review of Systems  Constitutional:       Per HPI, otherwise negative  HENT:       Per HPI, otherwise negative  Respiratory:       Per HPI, otherwise negative  Cardiovascular:       Per HPI, otherwise negative  Gastrointestinal: Positive for nausea, vomiting and abdominal pain. Negative for diarrhea.  Endocrine:       Negative aside from HPI  Genitourinary:       Neg aside from HPI   Musculoskeletal:       Knee is healing well.  Skin: Negative.   Neurological: Negative for syncope.    Allergies  Haldol and Amoxicillin  Home Medications   Current Outpatient Rx  Name  Route  Sig  Dispense  Refill  . albuterol (PROVENTIL HFA;VENTOLIN HFA) 108 (90 BASE) MCG/ACT inhaler   Inhalation   Inhale 2 puffs into the lungs every 6 (six) hours as needed for wheezing.         Marland Kitchen aspirin 325 MG tablet   Oral   Take 325 mg by mouth daily.         Marland Kitchen aspirin 81 MG tablet   Oral   Take 81 mg by mouth daily.         Marland Kitchen atorvastatin (LIPITOR) 80 MG tablet   Oral    Take 80 mg by mouth at bedtime.          . brimonidine (ALPHAGAN) 0.15 % ophthalmic solution   Both Eyes   Place 1 drop into both eyes 2 (two) times daily.         Marland Kitchen buPROPion (WELLBUTRIN XL) 150 MG 24 hr tablet   Oral   Take 150 mg by mouth daily before breakfast.          . clonazePAM (KLONOPIN) 0.5 MG tablet   Oral   Take 0.5 mg by mouth 2 (two) times daily.          . clopidogrel (PLAVIX) 75 MG tablet   Oral   Take 75 mg by mouth daily before breakfast.          . diphenhydrAMINE (BENADRYL) 25 mg capsule   Oral   Take 1 capsule (25 mg total) by mouth every 6 (six) hours as needed for itching, allergies or sleep.   30 capsule      . docusate sodium 100 MG CAPS   Oral   Take 100 mg by mouth 2 (two) times daily.   10 capsule      . Ensure Plus (ENSURE PLUS) LIQD   Oral   Take 237 mLs by mouth 2 (two) times daily between meals.         . ferrous sulfate 325 (65 FE) MG tablet   Oral   Take 1 tablet (325 mg total) by mouth 3 (three) times daily after meals.         . Fluticasone-Salmeterol (ADVAIR) 250-50 MCG/DOSE AEPB   Inhalation   Inhale 1 puff into the lungs daily before breakfast.         . isosorbide mononitrate (IMDUR) 30 MG 24 hr tablet   Oral   Take 30 mg by mouth 2 (two) times daily.         Marland Kitchen lamoTRIgine (LAMICTAL) 100 MG tablet   Oral   Take 100 mg by mouth every evening.          . lamoTRIgine (LAMICTAL) 25 MG tablet   Oral   Take 50 mg by mouth every evening. Takes 2 tablets along with a 100 mg tablet to equal  150 mg         . lisinopril (PRINIVIL,ZESTRIL) 20 MG tablet   Oral   Take 10 mg by mouth daily before breakfast.          . metoprolol tartrate (LOPRESSOR) 25 MG tablet   Oral   Take 0.5 tablets (12.5 mg total) by mouth 2 (two) times daily.           To hold if heart rate drops too much. She monitors ...   . niacin (NIASPAN) 1000 MG CR tablet   Oral   Take 1,000 mg by mouth at bedtime.         .  nitroGLYCERIN (NITROSTAT) 0.4 MG SL tablet   Sublingual   Place 0.4 mg under the tongue every 5 (five) minutes as needed.         Marland Kitchen omega-3 acid ethyl esters (LOVAZA) 1 G capsule   Oral   Take 1 g by mouth 2 (two) times daily.         . ondansetron (ZOFRAN) 4 MG tablet   Oral   Take 1 tablet (4 mg total) by mouth every 8 (eight) hours as needed for nausea.   12 tablet   0   . oxybutynin (DITROPAN-XL) 10 MG 24 hr tablet   Oral   Take 10 mg by mouth daily. TAKES IN AM         . pantoprazole (PROTONIX) 40 MG tablet   Oral   Take 40 mg by mouth daily.         . traZODone (DESYREL) 100 MG tablet   Oral   Take 100-200 mg by mouth at bedtime.         . vitamin B-12 (CYANOCOBALAMIN) 100 MCG tablet   Oral   Take 50 mcg by mouth daily.         . vitamin C (ASCORBIC ACID) 500 MG tablet   Oral   Take 500 mg by mouth daily.           BP 146/112  Pulse 70  Temp(Src) 98.7 F (37.1 C) (Oral)  Resp 18  SpO2 100%  LMP 11/16/2011  Physical Exam  Nursing note and vitals reviewed. Constitutional: She is oriented to person, place, and time. She appears well-developed and well-nourished. No distress.  HENT:  Head: Normocephalic and atraumatic.  Eyes: Conjunctivae and EOM are normal.  Cardiovascular: Normal rate and regular rhythm.   Pulmonary/Chest: Effort normal and breath sounds normal. No stridor. No respiratory distress.  Abdominal: She exhibits no distension.  Musculoskeletal: She exhibits no edema.  R knee w anterior laceration line - no drainage, no bleeding.  Patient states that the knee is improving. She also notes mild trauma today, but no new ecchymosis or loss of ROM.  Neurological: She is alert and oriented to person, place, and time. No cranial nerve deficit.  Skin: Skin is warm and dry.  Psychiatric: She has a normal mood and affect.    ED Course  Procedures (including critical care time)  Labs Reviewed  CBC WITH DIFFERENTIAL  COMPREHENSIVE  METABOLIC PANEL  LIPASE, BLOOD  URINALYSIS, ROUTINE W REFLEX MICROSCOPIC   Ct Abdomen Pelvis W Contrast  11/09/2012  *RADIOLOGY REPORT*  Clinical Data: Abdominal pain  CT ABDOMEN AND PELVIS WITH CONTRAST  Technique:  Multidetector CT imaging of the abdomen and pelvis was performed following the standard protocol during bolus administration of intravenous contrast.  Contrast: 50mL OMNIPAQUE IOHEXOL 300 MG/ML  SOLN, OMNIPAQUE IOHEXOL 300 MG/ML  SOLN  Comparison: 05/20/2012  Findings: Lung bases are predominately clear.  Normal heart size.  Low attenuation of the liver suggests fatty infiltration.  Dependent gallstones.  No biliary ductal dilatation.  Unremarkable spleen, pancreas, right adrenal gland.  Left adrenal enlargement without a dominant nodule.  There are a couple hypodensities within the kidneys, similar to prior, too small to further characterize.  Mild fullness of the right renal pelvis and ureter to the level of the iliac vessels. The ureter below this level appears decompressed.  There is symmetric excretion on the delayed phase.  No bowel obstruction.  No CT evidence for colitis.  Short appendix versus appendiceal stump, without periappendiceal inflammation  No free intraperitoneal air or fluid.  No lymphadenopathy.  There is scattered atherosclerotic calcification of the aorta and its branches. No aneurysmal dilatation.  Partially decompressed bladder.  Unremarkable CT appearance to the uterus and adnexa.  No acute osseous finding.  L5-S1 facet arthropathy  IMPRESSION: Hepatic steatosis.  Gallstones.  No CT evidence for cholecystitis.  Mild fullness of the right renal pelvis is nonspecific. No obstructing lesion is visualized.   Original Report Authenticated By: Jearld Lesch, M.D.      No diagnosis found.  I reviewed the patient's visit notes from 2d ago, including CT results.  10:49 AM Initial labs returning - unremarkable.  Patient smiling, but stating that she feels  poorly.  12:22 PM Patient states that she feels substantially better.  We discussed at length, today's lab results, her prior CT scan, the need for continued evaluation.  MDM  Patient presents with nausea and vomiting.  Notably, the patient has a recent surgical repair of her right knee, and had one similar episode several days ago, which was evaluated with labs, and CT scan.  Today, the patient is initially uncomfortable appearing, but improve substantially with fluids, antiemetics, analgesics.  Given the patient's person of tolerating liquid nutrition, but onset of nausea with vomiting follow solid food intake, there suspicion for her advancing her diet too rapidly.  Absent fever, significantly abnormal labs, vital sign abnormalities, and with the resolution of symptoms, as well as her recent CT scan, there is low suspicion for acute GI pathology.  We discussed results of length, and the patient endorses a recently diagnosed eating disorder, for which she is also following up with her psychiatrist.  The patient is appropriate for discharge, though this is a work in progress.  Return precautions provided, suppository antiemetics provided.        Gerhard Munch, MD 11/10/12 [phone]

## 2012-11-10 NOTE — ED Notes (Signed)
Patient transported to X-ray 

## 2012-11-10 NOTE — ED Notes (Signed)
Discharge instructions reviewed w/ pt., verbalizes understanding. One prescription provided at discharge. 

## 2012-11-10 NOTE — ED Notes (Signed)
Pt woke this a.m. W/ nausea, vomiting and generalized abdominal pain. Pt T&R in ED on Friday for same, tolerated some Ensure w/o difficulty. Ate 1/2 chicken filet and OJ w/o difficulty until she awoke this a.m. W/ the recurrence of vomiting.

## 2012-11-25 ENCOUNTER — Encounter: Payer: Self-pay | Admitting: Internal Medicine

## 2012-11-28 ENCOUNTER — Other Ambulatory Visit (HOSPITAL_COMMUNITY)
Admission: RE | Admit: 2012-11-28 | Discharge: 2012-11-28 | Disposition: A | Discharge status: Home / Self Care | Payer: Medicare Other | Source: Ambulatory Visit | Attending: Obstetrics and Gynecology | Admitting: Obstetrics and Gynecology

## 2012-11-28 ENCOUNTER — Ambulatory Visit (INDEPENDENT_AMBULATORY_CARE_PROVIDER_SITE_OTHER): Payer: Medicare Other | Admitting: Obstetrics and Gynecology

## 2012-11-28 ENCOUNTER — Encounter: Payer: Self-pay | Admitting: Obstetrics and Gynecology

## 2012-11-28 VITALS — BP 148/90 | HR 72 | Temp 97.4°F | Ht 65.0 in | Wt 159.9 lb

## 2012-11-28 DIAGNOSIS — N926 Irregular menstruation, unspecified: Secondary | ICD-10-CM

## 2012-11-28 DIAGNOSIS — N939 Abnormal uterine and vaginal bleeding, unspecified: Secondary | ICD-10-CM

## 2012-11-28 DIAGNOSIS — Z01812 Encounter for preprocedural laboratory examination: Secondary | ICD-10-CM

## 2012-11-28 DIAGNOSIS — Z124 Encounter for screening for malignant neoplasm of cervix: Secondary | ICD-10-CM | POA: Insufficient documentation

## 2012-11-28 DIAGNOSIS — Z01419 Encounter for gynecological examination (general) (routine) without abnormal findings: Secondary | ICD-10-CM

## 2012-11-28 DIAGNOSIS — Z1151 Encounter for screening for human papillomavirus (HPV): Secondary | ICD-10-CM | POA: Insufficient documentation

## 2012-11-28 LAB — POCT PREGNANCY, URINE: Preg Test, Ur: NEGATIVE

## 2012-11-28 NOTE — Progress Notes (Signed)
Subjective:     [name] is a 52 y.o. female with LMP 11/16/2011 and BMI 26 who is here for a comprehensive physical exam. The patient reports irregular menstrual cycles. Patient was seen by urologist and told that she has 2 small fibroids. Records from [facility] shows a CT abd/pelvis with unremarkable uterus and bilateral ovaries. Patient is requesting a hysterectomy as she attributes her irregular cycles to the fibroids. Patient reports that she has one episode of vaginal bleeding " once in a blue moon". She is convinced that she has cancer.  History   Social History  . Marital Status: Divorced    Spouse Name: N/A    Number of Children: N/A  . Years of Education: N/A   Occupational History  . Not on file.   Social History Main Topics  . Smoking status: Current Every Day Smoker -- 0.50 packs/day for 25 years    Types: Cigarettes  . Smokeless tobacco: Never Used  . Alcohol Use: No  . Drug Use: 2.00 per week    Special: Marijuana     Comment: occ  . Sexually Active: Not on file   Other Topics Concern  . Not on file   Social History Narrative  . No narrative on file   Health Maintenance  Topic Date Due  . Pap Smear  01/06/1980  . Tetanus/tdap  01/05/1981  . Colonoscopy  01/06/2012  . Influenza Vaccine  04/21/2013  . Mammogram  01/28/2014   Past Medical History  Diagnosis Date  . Coronary artery disease   . MI (myocardial infarction)   . Borderline diabetic     PT STATES NOT DIABETIC--DID HAVE GESTATIONAL DIABETES  . Hypertension   . High cholesterol   . COPD (chronic obstructive pulmonary disease)   . Asthma   . Bipolar 1 disorder   . Fibroids     UTERINE FIBROIDS--PT STATES VAGINAL BLEEDING OFF AND ON WHICH SHE RELATES TO HER FIBROIDS-  . Sleep apnea     DOES NOT USE CPAP--HAS NASAL MASK AT HOME  . Complication of anesthesia     CLAUSTROPHOBIA-STATES DOES NOT TOLERATE NASAL CPAP  . Heart murmur   . Chronic kidney disease     LEFT RENAL  CYST  . Urinary, incontinence, stress female   . Arthritis     OA KNEES, SHOULDERS;  DJD   . Anemia   . Hyperlipidemia   . Peripheral vascular disease     CAROTID ARTERY BLOCKAGES-MONITORED BY DR. Cherly Hensen CAROTID SURGERY    PT STATES SHE WAS TOLD SHE HAS"BLOOD CLOTS ALL OVER HER BODY"   . Glaucoma     BOTH EYES   Past Surgical History  Procedure Laterality Date  . Cardiac surgery    . Cardiac stents    . Coronary artery bypass graft  2008  . Pinning left foot    . C-sections x 2    . 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 Right knee arthroscopy    . Eye surgery      MULTIPLE LASER SURGERIES LEFT EYE BECAUSE OF GLAUCOMA  . Total knee arthroplasty Right 10/21/2012    Procedure: TOTAL KNEE ARTHROPLASTY;  Surgeon: Shelda Pal, MD;  Location: [facility];  Service: Orthopedics;  Laterality: Right;   History reviewed. No pertinent family history.     Review of Systems A comprehensive review of systems was negative.   Objective:  GENERAL: Well-developed, well-nourished female in no acute distress.  HEENT: Normocephalic, atraumatic. Sclerae anicteric.  NECK: Supple. Normal thyroid.  LUNGS: Clear to auscultation bilaterally.  HEART: Regular rate and rhythm. BREASTS: Symmetric in size. No palpable masses or lymphadenopathy, skin changes, or nipple drainage. ABDOMEN: Soft, nontender, nondistended. No organomegaly. PELVIC: Normal external female genitalia. Vagina is pink and rugated.  Normal discharge. Normal appearing cervix. Uterus is normal in size. No adnexal mass or tenderness. EXTREMITIES: No cyanosis, clubbing, or edema, 2+ distal pulses.    Assessment:    Healthy female exam.      Plan:    Pap smear performed Patient is scheduled for mammogram in early summer Will obtain pelvic ultrasound for better evaluation of uterus and ? Fibroids Endometrial biopsy attempted as per patient request but unsuccessful secondary  to stenotic os. Based on ultrasound reports, patient will be scheduled for endometrial biopsy if indicated. Patient should be pre-medicated with cytotec per vagina (patient never had a vaginal birth) See After Visit Summary for Counseling Recommendations

## 2012-11-28 NOTE — Patient Instructions (Addendum)
Preventive Care for Adults, Female A healthy lifestyle and preventive care can promote health and wellness. Preventive health guidelines for women include the following key practices.  A routine yearly physical is a good way to check with your caregiver about your health and preventive screening. It is a chance to share any concerns and updates on your health, and to receive a thorough exam.  Visit your dentist for a routine exam and preventive care every 6 months. Brush your teeth twice a day and floss once a day. Good oral hygiene prevents tooth decay and gum disease.  The frequency of eye exams is based on your age, health, family medical history, use of contact lenses, and other factors. Follow your caregiver's recommendations for frequency of eye exams.  Eat a healthy diet. Foods like vegetables, fruits, whole grains, low-fat dairy products, and lean protein foods contain the nutrients you need without too many calories. Decrease your intake of foods high in solid fats, added sugars, and salt. Eat the right amount of calories for you.Get information about a proper diet from your caregiver, if necessary.  Regular physical exercise is one of the most important things you can do for your health. Most adults should get at least 150 minutes of moderate-intensity exercise (any activity that increases your heart rate and causes you to sweat) each week. In addition, most adults need muscle-strengthening exercises on 2 or more days a week.  Maintain a healthy weight. The body mass index (BMI) is a screening tool to identify possible weight problems. It provides an estimate of body fat based on height and weight. Your caregiver can help determine your BMI, and can help you achieve or maintain a healthy weight.For adults 20 years and older:  A BMI below 18.5 is considered underweight.  A BMI of 18.5 to 24.9 is normal.  A BMI of 25 to 29.9 is considered overweight.  A BMI of 30 and above is  considered obese.  Maintain normal blood lipids and cholesterol levels by exercising and minimizing your intake of saturated fat. Eat a balanced diet with plenty of fruit and vegetables. Blood tests for lipids and cholesterol should begin at age 20 and be repeated every 5 years. If your lipid or cholesterol levels are high, you are over 50, or you are at high risk for heart disease, you may need your cholesterol levels checked more frequently.Ongoing high lipid and cholesterol levels should be treated with medicines if diet and exercise are not effective.  If you smoke, find out from your caregiver how to quit. If you do not use tobacco, do not start.  If you are pregnant, do not drink alcohol. If you are breastfeeding, be very cautious about drinking alcohol. If you are not pregnant and choose to drink alcohol, do not exceed 1 drink per day. One drink is considered to be 12 ounces (355 mL) of beer, 5 ounces (148 mL) of wine, or 1.5 ounces (44 mL) of liquor.  Avoid use of street drugs. Do not share needles with anyone. Ask for help if you need support or instructions about stopping the use of drugs.  High blood pressure causes heart disease and increases the risk of stroke. Your blood pressure should be checked at least every 1 to 2 years. Ongoing high blood pressure should be treated with medicines if weight loss and exercise are not effective.  If you are 55 to 51 years old, ask your caregiver if you should take aspirin to prevent strokes.  Diabetes   screening involves taking a blood sample to check your fasting blood sugar level. This should be done once every 3 years, after age 45, if you are within normal weight and without risk factors for diabetes. Testing should be considered at a younger age or be carried out more frequently if you are overweight and have at least 1 risk factor for diabetes.  Breast cancer screening is essential preventive care for women. You should practice "breast  self-awareness." This means understanding the normal appearance and feel of your breasts and may include breast self-examination. Any changes detected, no matter how small, should be reported to a caregiver. Women in their 20s and 30s should have a clinical breast exam (CBE) by a caregiver as part of a regular health exam every 1 to 3 years. After age 40, women should have a CBE every year. Starting at age 40, women should consider having a mammography (breast X-ray test) every year. Women who have a family history of breast cancer should talk to their caregiver about genetic screening. Women at a high risk of breast cancer should talk to their caregivers about having magnetic resonance imaging (MRI) and a mammography every year.  The Pap test is a screening test for cervical cancer. A Pap test can show cell changes on the cervix that might become cervical cancer if left untreated. A Pap test is a procedure in which cells are obtained and examined from the lower end of the uterus (cervix).  Women should have a Pap test starting at age 21.  Between ages 21 and 29, Pap tests should be repeated every 2 years.  Beginning at age 30, you should have a Pap test every 3 years as long as the past 3 Pap tests have been normal.  Some women have medical problems that increase the chance of getting cervical cancer. Talk to your caregiver about these problems. It is especially important to talk to your caregiver if a new problem develops soon after your last Pap test. In these cases, your caregiver may recommend more frequent screening and Pap tests.  The above recommendations are the same for women who have or have not gotten the vaccine for human papillomavirus (HPV).  If you had a hysterectomy for a problem that was not cancer or a condition that could lead to cancer, then you no longer need Pap tests. Even if you no longer need a Pap test, a regular exam is a good idea to make sure no other problems are  starting.  If you are between ages 65 and 70, and you have had normal Pap tests going back 10 years, you no longer need Pap tests. Even if you no longer need a Pap test, a regular exam is a good idea to make sure no other problems are starting.  If you have had past treatment for cervical cancer or a condition that could lead to cancer, you need Pap tests and screening for cancer for at least 20 years after your treatment.  If Pap tests have been discontinued, risk factors (such as a new sexual partner) need to be reassessed to determine if screening should be resumed.  The HPV test is an additional test that may be used for cervical cancer screening. The HPV test looks for the virus that can cause the cell changes on the cervix. The cells collected during the Pap test can be tested for HPV. The HPV test could be used to screen women aged 30 years and older, and should   be used in women of any age who have unclear Pap test results. After the age of 30, women should have HPV testing at the same frequency as a Pap test.  Colorectal cancer can be detected and often prevented. Most routine colorectal cancer screening begins at the age of 50 and continues through age 75. However, your caregiver may recommend screening at an earlier age if you have risk factors for colon cancer. On a yearly basis, your caregiver may provide home test kits to check for hidden blood in the stool. Use of a small camera at the end of a tube, to directly examine the colon (sigmoidoscopy or colonoscopy), can detect the earliest forms of colorectal cancer. Talk to your caregiver about this at age 50, when routine screening begins. Direct examination of the colon should be repeated every 5 to 10 years through age 75, unless early forms of pre-cancerous polyps or small growths are found.  Hepatitis C blood testing is recommended for all people born from 1945 through 1965 and any individual with known risks for hepatitis C.  Practice  safe sex. Use condoms and avoid high-risk sexual practices to reduce the spread of sexually transmitted infections (STIs). STIs include gonorrhea, chlamydia, syphilis, trichomonas, herpes, HPV, and human immunodeficiency virus (HIV). Herpes, HIV, and HPV are viral illnesses that have no cure. They can result in disability, cancer, and death. Sexually active women aged 25 and younger should be checked for chlamydia. Older women with new or multiple partners should also be tested for chlamydia. Testing for other STIs is recommended if you are sexually active and at increased risk.  Osteoporosis is a disease in which the bones lose minerals and strength with aging. This can result in serious bone fractures. The risk of osteoporosis can be identified using a bone density scan. Women ages 65 and over and women at risk for fractures or osteoporosis should discuss screening with their caregivers. Ask your caregiver whether you should take a calcium supplement or vitamin D to reduce the rate of osteoporosis.  Menopause can be associated with physical symptoms and risks. Hormone replacement therapy is available to decrease symptoms and risks. You should talk to your caregiver about whether hormone replacement therapy is right for you.  Use sunscreen with sun protection factor (SPF) of 30 or more. Apply sunscreen liberally and repeatedly throughout the day. You should seek shade when your shadow is shorter than you. Protect yourself by wearing long sleeves, pants, a wide-brimmed hat, and sunglasses year round, whenever you are outdoors.  Once a month, do a whole body skin exam, using a mirror to look at the skin on your back. Notify your caregiver of new moles, moles that have irregular borders, moles that are larger than a pencil eraser, or moles that have changed in shape or color.  Stay current with required immunizations.  Influenza. You need a dose every fall (or winter). The composition of the flu vaccine  changes each year, so being vaccinated once is not enough.  Pneumococcal polysaccharide. You need 1 to 2 doses if you smoke cigarettes or if you have certain chronic medical conditions. You need 1 dose at age 65 (or older) if you have never been vaccinated.  Tetanus, diphtheria, pertussis (Tdap, Td). Get 1 dose of Tdap vaccine if you are younger than age 65, are over 65 and have contact with an infant, are a healthcare worker, are pregnant, or simply want to be protected from whooping cough. After that, you need a Td   booster dose every 10 years. Consult your caregiver if you have not had at least 3 tetanus and diphtheria-containing shots sometime in your life or have a deep or dirty wound.  HPV. You need this vaccine if you are a woman age 26 or younger. The vaccine is given in 3 doses over 6 months.  Measles, mumps, rubella (MMR). You need at least 1 dose of MMR if you were born in 1957 or later. You may also need a second dose.  Meningococcal. If you are age 19 to 21 and a first-year college student living in a residence hall, or have one of several medical conditions, you need to get vaccinated against meningococcal disease. You may also need additional booster doses.  Zoster (shingles). If you are age 60 or older, you should get this vaccine.  Varicella (chickenpox). If you have never had chickenpox or you were vaccinated but received only 1 dose, talk to your caregiver to find out if you need this vaccine.  Hepatitis A. You need this vaccine if you have a specific risk factor for hepatitis A virus infection or you simply wish to be protected from this disease. The vaccine is usually given as 2 doses, 6 to 18 months apart.  Hepatitis B. You need this vaccine if you have a specific risk factor for hepatitis B virus infection or you simply wish to be protected from this disease. The vaccine is given in 3 doses, usually over 6 months. Preventive Services / Frequency Ages 40 to 64  Blood  pressure check.** / Every 1 to 2 years.  Lipid and cholesterol check.** / Every 5 years beginning at age 20.  Clinical breast exam.** / Every year after age 40.  Mammogram.** / Every year beginning at age 40 and continuing for as long as you are in good health. Consult with your caregiver.  Pap test.** / Every 3 years starting at age 30 through age 65 or 70 with a history of 3 consecutive normal Pap tests.  HPV screening.** / Every 3 years from ages 30 through ages 65 to 70 with a history of 3 consecutive normal Pap tests.  Fecal occult blood test (FOBT) of stool. / Every year beginning at age 50 and continuing until age 75. You may not need to do this test if you get a colonoscopy every 10 years.  Flexible sigmoidoscopy or colonoscopy.** / Every 5 years for a flexible sigmoidoscopy or every 10 years for a colonoscopy beginning at age 50 and continuing until age 75.  Hepatitis C blood test.** / For all people born from 1945 through 1965 and any individual with known risks for hepatitis C.  Skin self-exam. / Monthly.  Influenza immunization.** / Every year.  Pneumococcal polysaccharide immunization.** / 1 to 2 doses if you smoke cigarettes or if you have certain chronic medical conditions.  Tetanus, diphtheria, pertussis (Tdap, Td) immunization.** / A one-time dose of Tdap vaccine. After that, you need a Td booster dose every 10 years.  Measles, mumps, rubella (MMR) immunization. / You need at least 1 dose of MMR if you were born in 1957 or later. You may also need a second dose.  Varicella immunization.** / Consult your caregiver.  Meningococcal immunization.** / Consult your caregiver.  Hepatitis A immunization.** / Consult your caregiver. 2 doses, 6 to 18 months apart.  Hepatitis B immunization.** / Consult your caregiver. 3 doses, usually over 6 months. ** Family history and personal history of risk and conditions may change your caregiver's recommendations.   Document Released:  10/03/2001 Document Revised: 10/30/2011 Document Reviewed: 01/02/2011 [employer] Patient Information 2013 [employer].  

## 2012-11-29 ENCOUNTER — Ambulatory Visit: Payer: Medicare Other | Admitting: *Deleted

## 2012-12-05 ENCOUNTER — Ambulatory Visit (HOSPITAL_COMMUNITY)
Admission: RE | Admit: 2012-12-05 | Discharge: 2012-12-05 | Disposition: A | Discharge status: Home / Self Care | Payer: Medicare Other | Source: Ambulatory Visit | Attending: Obstetrics and Gynecology | Admitting: Obstetrics and Gynecology

## 2012-12-05 DIAGNOSIS — N949 Unspecified condition associated with female genital organs and menstrual cycle: Secondary | ICD-10-CM | POA: Insufficient documentation

## 2012-12-05 DIAGNOSIS — N939 Abnormal uterine and vaginal bleeding, unspecified: Secondary | ICD-10-CM

## 2012-12-05 DIAGNOSIS — N925 Other specified irregular menstruation: Secondary | ICD-10-CM | POA: Insufficient documentation

## 2012-12-05 DIAGNOSIS — N938 Other specified abnormal uterine and vaginal bleeding: Secondary | ICD-10-CM | POA: Insufficient documentation

## 2012-12-09 ENCOUNTER — Telehealth: Payer: Self-pay | Admitting: General Practice

## 2012-12-09 NOTE — Telephone Encounter (Signed)
Called patient and informed her of normal pap and ultrasound. Patient verbalized understanding and had no further questions

## 2012-12-12 ENCOUNTER — Encounter: Payer: Self-pay | Admitting: *Deleted

## 2012-12-18 ENCOUNTER — Encounter: Payer: Self-pay | Admitting: Internal Medicine

## 2012-12-23 ENCOUNTER — Ambulatory Visit: Payer: Medicare Other | Admitting: Internal Medicine

## 2013-01-09 ENCOUNTER — Encounter: Payer: Self-pay | Admitting: Internal Medicine

## 2013-01-14 ENCOUNTER — Ambulatory Visit: Payer: Medicare Other | Admitting: Internal Medicine

## 2013-01-18 ENCOUNTER — Other Ambulatory Visit (HOSPITAL_COMMUNITY): Payer: Self-pay | Admitting: Cardiovascular Disease

## 2013-01-22 ENCOUNTER — Other Ambulatory Visit (HOSPITAL_COMMUNITY): Payer: Self-pay | Admitting: Cardiovascular Disease

## 2013-01-24 ENCOUNTER — Other Ambulatory Visit: Payer: Self-pay | Admitting: *Deleted

## 2013-03-03 ENCOUNTER — Other Ambulatory Visit (HOSPITAL_COMMUNITY): Payer: Self-pay | Admitting: Cardiovascular Disease

## 2013-03-03 NOTE — Telephone Encounter (Signed)
Rx was sent to pharmacy electronically. 

## 2013-03-21 ENCOUNTER — Telehealth: Payer: Self-pay | Admitting: *Deleted

## 2013-03-21 NOTE — Telephone Encounter (Signed)
Message copied by Marella Bile on Fri Mar 21, 2013  1:02 PM ------      Message from: Runell Gess      Created: Thu Mar 20, 2013  6:23 PM       Patient does not need to be premedicated prior to dental work. Okay to stop Plavix ------

## 2013-03-21 NOTE — Telephone Encounter (Signed)
[facility] faxed a request asking if it was ok from a cardiovascular standpoint to proceed with dental work.  Dr Allyson Sabal reviewed and said it was ok and she could hold plavix.  No SBE prophylaxis needed.  Letter was faxed

## 2013-03-28 ENCOUNTER — Other Ambulatory Visit: Payer: Self-pay | Admitting: *Deleted

## 2013-03-28 MED ORDER — ATORVASTATIN CALCIUM 80 MG PO TABS: 80.0000 mg | ORAL_TABLET | Freq: Every day | ORAL | Status: DC

## 2013-03-28 NOTE — Telephone Encounter (Signed)
Rx was sent to pharmacy electronically. 

## 2013-04-23 ENCOUNTER — Other Ambulatory Visit (HOSPITAL_COMMUNITY): Payer: Self-pay | Admitting: Internal Medicine

## 2013-04-23 DIAGNOSIS — R220 Localized swelling, mass and lump, head: Secondary | ICD-10-CM

## 2013-04-25 ENCOUNTER — Ambulatory Visit (HOSPITAL_COMMUNITY): Payer: Medicare Other

## 2013-05-07 ENCOUNTER — Ambulatory Visit (HOSPITAL_COMMUNITY)
Admission: RE | Admit: 2013-05-07 | Discharge: 2013-05-07 | Disposition: A | Discharge status: Home / Self Care | Payer: Medicare Other | Source: Ambulatory Visit | Attending: Internal Medicine | Admitting: Internal Medicine

## 2013-05-07 DIAGNOSIS — R220 Localized swelling, mass and lump, head: Secondary | ICD-10-CM

## 2013-05-07 DIAGNOSIS — K119 Disease of salivary gland, unspecified: Secondary | ICD-10-CM | POA: Insufficient documentation

## 2013-05-30 ENCOUNTER — Encounter: Payer: Self-pay | Admitting: *Deleted

## 2013-06-04 ENCOUNTER — Telehealth: Payer: Self-pay | Admitting: *Deleted

## 2013-06-04 NOTE — Telephone Encounter (Signed)
Dr Allyson Sabal reviewed the chart and cleared Ms Reddix to have dental work done.  Premedication is not needed.  Form faxed back to Leonidas Romberg, DMD

## 2013-06-26 ENCOUNTER — Other Ambulatory Visit: Payer: Self-pay

## 2013-08-01 ENCOUNTER — Other Ambulatory Visit: Payer: Self-pay | Admitting: Cardiovascular Disease

## 2013-08-01 NOTE — Telephone Encounter (Signed)
Rx was sent to pharmacy electronically. 

## 2013-09-23 ENCOUNTER — Ambulatory Visit: Payer: Medicare Other | Admitting: Cardiovascular Disease

## 2013-10-02 ENCOUNTER — Other Ambulatory Visit: Payer: Self-pay | Admitting: Cardiovascular Disease

## 2013-10-07 ENCOUNTER — Ambulatory Visit: Payer: Medicare Other | Admitting: Cardiovascular Disease

## 2013-10-29 ENCOUNTER — Other Ambulatory Visit: Payer: Self-pay | Admitting: Nurse Practitioner

## 2013-10-29 DIAGNOSIS — Z1231 Encounter for screening mammogram for malignant neoplasm of breast: Secondary | ICD-10-CM

## 2013-11-03 ENCOUNTER — Other Ambulatory Visit: Payer: Self-pay | Admitting: *Deleted

## 2013-11-03 MED ORDER — PANTOPRAZOLE SODIUM 40 MG PO TBEC: 40.0000 mg | DELAYED_RELEASE_TABLET | Freq: Every day | ORAL | Status: DC

## 2013-11-03 NOTE — Telephone Encounter (Signed)
Rx was sent to pharmacy electronically. 

## 2013-11-04 ENCOUNTER — Other Ambulatory Visit: Payer: Self-pay | Admitting: *Deleted

## 2013-11-12 ENCOUNTER — Ambulatory Visit: Payer: Medicare Other

## 2013-11-25 ENCOUNTER — Telehealth: Payer: Self-pay | Admitting: Cardiovascular Disease

## 2013-12-02 ENCOUNTER — Other Ambulatory Visit: Payer: Self-pay | Admitting: *Deleted

## 2013-12-02 MED ORDER — METOPROLOL TARTRATE 25 MG PO TABS: 12.5000 mg | ORAL_TABLET | Freq: Two times a day (BID) | ORAL | Status: DC

## 2013-12-02 MED ORDER — ISOSORBIDE MONONITRATE ER 30 MG PO TB24: 30.0000 mg | ORAL_TABLET | Freq: Every day | ORAL | Status: DC

## 2013-12-02 NOTE — Telephone Encounter (Signed)
Rx refill sent to patients pharmacy  

## 2013-12-03 ENCOUNTER — Other Ambulatory Visit: Payer: Self-pay | Admitting: *Deleted

## 2013-12-03 NOTE — Telephone Encounter (Signed)
Rx refill was sent in 12/02/13

## 2013-12-11 NOTE — Telephone Encounter (Signed)
Closed encounter °

## 2013-12-12 ENCOUNTER — Emergency Department (HOSPITAL_COMMUNITY)
Admission: EM | Admit: 2013-12-12 | Discharge: 2013-12-12 | Disposition: A | Discharge status: Home / Self Care | Payer: Medicare Other | Attending: Emergency Medicine | Admitting: Emergency Medicine

## 2013-12-12 ENCOUNTER — Encounter (HOSPITAL_COMMUNITY): Payer: Self-pay | Admitting: Emergency Medicine

## 2013-12-12 DIAGNOSIS — E669 Obesity, unspecified: Secondary | ICD-10-CM | POA: Insufficient documentation

## 2013-12-12 DIAGNOSIS — R011 Cardiac murmur, unspecified: Secondary | ICD-10-CM | POA: Insufficient documentation

## 2013-12-12 DIAGNOSIS — Z8742 Personal history of other diseases of the female genital tract: Secondary | ICD-10-CM | POA: Insufficient documentation

## 2013-12-12 DIAGNOSIS — IMO0002 Reserved for concepts with insufficient information to code with codable children: Secondary | ICD-10-CM | POA: Insufficient documentation

## 2013-12-12 DIAGNOSIS — F209 Schizophrenia, unspecified: Secondary | ICD-10-CM | POA: Insufficient documentation

## 2013-12-12 DIAGNOSIS — Z880 Allergy status to penicillin: Secondary | ICD-10-CM | POA: Insufficient documentation

## 2013-12-12 DIAGNOSIS — I129 Hypertensive chronic kidney disease with stage 1 through stage 4 chronic kidney disease, or unspecified chronic kidney disease: Secondary | ICD-10-CM | POA: Insufficient documentation

## 2013-12-12 DIAGNOSIS — F319 Bipolar disorder, unspecified: Secondary | ICD-10-CM | POA: Insufficient documentation

## 2013-12-12 DIAGNOSIS — Z7982 Long term (current) use of aspirin: Secondary | ICD-10-CM | POA: Insufficient documentation

## 2013-12-12 DIAGNOSIS — Z951 Presence of aortocoronary bypass graft: Secondary | ICD-10-CM | POA: Insufficient documentation

## 2013-12-12 DIAGNOSIS — L259 Unspecified contact dermatitis, unspecified cause: Secondary | ICD-10-CM

## 2013-12-12 DIAGNOSIS — E785 Hyperlipidemia, unspecified: Secondary | ICD-10-CM | POA: Insufficient documentation

## 2013-12-12 DIAGNOSIS — I252 Old myocardial infarction: Secondary | ICD-10-CM | POA: Insufficient documentation

## 2013-12-12 DIAGNOSIS — Z79899 Other long term (current) drug therapy: Secondary | ICD-10-CM | POA: Insufficient documentation

## 2013-12-12 DIAGNOSIS — J441 Chronic obstructive pulmonary disease with (acute) exacerbation: Secondary | ICD-10-CM | POA: Insufficient documentation

## 2013-12-12 DIAGNOSIS — N189 Chronic kidney disease, unspecified: Secondary | ICD-10-CM | POA: Insufficient documentation

## 2013-12-12 DIAGNOSIS — F17200 Nicotine dependence, unspecified, uncomplicated: Secondary | ICD-10-CM | POA: Insufficient documentation

## 2013-12-12 DIAGNOSIS — I2510 Atherosclerotic heart disease of native coronary artery without angina pectoris: Secondary | ICD-10-CM | POA: Insufficient documentation

## 2013-12-12 DIAGNOSIS — K219 Gastro-esophageal reflux disease without esophagitis: Secondary | ICD-10-CM | POA: Insufficient documentation

## 2013-12-12 DIAGNOSIS — Z9861 Coronary angioplasty status: Secondary | ICD-10-CM | POA: Insufficient documentation

## 2013-12-12 DIAGNOSIS — M1710 Unilateral primary osteoarthritis, unspecified knee: Secondary | ICD-10-CM | POA: Insufficient documentation

## 2013-12-12 DIAGNOSIS — M19019 Primary osteoarthritis, unspecified shoulder: Secondary | ICD-10-CM | POA: Insufficient documentation

## 2013-12-12 DIAGNOSIS — J45901 Unspecified asthma with (acute) exacerbation: Secondary | ICD-10-CM | POA: Insufficient documentation

## 2013-12-12 DIAGNOSIS — D649 Anemia, unspecified: Secondary | ICD-10-CM | POA: Insufficient documentation

## 2013-12-12 DIAGNOSIS — E7800 Pure hypercholesterolemia, unspecified: Secondary | ICD-10-CM | POA: Insufficient documentation

## 2013-12-12 DIAGNOSIS — H409 Unspecified glaucoma: Secondary | ICD-10-CM | POA: Insufficient documentation

## 2013-12-12 MED ORDER — HYDROXYZINE HCL 25 MG PO TABS: 25.0000 mg | ORAL_TABLET | Freq: Four times a day (QID) | ORAL | Status: DC | PRN

## 2013-12-12 MED ORDER — HYDROXYZINE HCL 25 MG PO TABS
25.0000 mg | ORAL_TABLET | Freq: Once | ORAL | Status: AC
  Administered 2013-12-12: 25 mg via ORAL
  Filled 2013-12-12: qty 1

## 2013-12-12 MED ORDER — PREDNISONE 20 MG PO TABS
60.0000 mg | ORAL_TABLET | Freq: Once | ORAL | Status: AC
  Administered 2013-12-12: 60 mg via ORAL
  Filled 2013-12-12: qty 3

## 2013-12-12 MED ORDER — FAMOTIDINE 20 MG PO TABS: 20.0000 mg | ORAL_TABLET | Freq: Every day | ORAL | Status: DC

## 2013-12-12 MED ORDER — ALBUTEROL SULFATE HFA 108 (90 BASE) MCG/ACT IN AERS
1.0000 | INHALATION_SPRAY | RESPIRATORY_TRACT | Status: DC | PRN
  Administered 2013-12-12: 2 via RESPIRATORY_TRACT
  Filled 2013-12-12: qty 6.7

## 2013-12-12 MED ORDER — DIPHENHYDRAMINE HCL 25 MG PO CAPS
50.0000 mg | ORAL_CAPSULE | Freq: Once | ORAL | Status: DC
  Filled 2013-12-12: qty 2

## 2013-12-12 MED ORDER — FAMOTIDINE 20 MG PO TABS
20.0000 mg | ORAL_TABLET | Freq: Once | ORAL | Status: AC
  Administered 2013-12-12: 20 mg via ORAL
  Filled 2013-12-12: qty 1

## 2013-12-12 MED ORDER — ALBUTEROL SULFATE HFA 108 (90 BASE) MCG/ACT IN AERS: 1.0000 | INHALATION_SPRAY | RESPIRATORY_TRACT | Status: DC | PRN

## 2013-12-12 MED ORDER — PREDNISONE 20 MG PO TABS: 60.0000 mg | ORAL_TABLET | Freq: Every day | ORAL | Status: DC

## 2013-12-12 NOTE — ED Notes (Addendum)
Pt reports she was weeding 4 days ago, started getting rash on her chest and around left eye. Now rash has spread to stomach and forehead. Pain 7/10. reports neck has started swelling and difficulty breathing started yesterday. Reports she received shingles shot this year. Calamine lotion applied with no relief.   Cardiac hx, but denies cardiac chest pain.

## 2013-12-12 NOTE — ED Provider Notes (Signed)
CSN: [id number]     Arrival date & time 12/12/13  1608 History   First MD Initiated Contact with Patient 12/12/13 1621     Chief Complaint  Patient presents with  . Shortness of Breath  . Rash     (Consider location/radiation/quality/duration/timing/severity/associated sxs/prior Treatment) HPI  This is a 53 year old female with history of coronary artery disease, hypertension, hypercholesterolemia, COPD who presents with rash or shortness of breath. Patient states that she was weeding in her garden several days ago. Since that time she has noted progressive rash over her anterior neck and abdomen. She's also noted some reported. The rash is itchy. She is taking Benadryl with little relief. She thinks she may have poison ivy. Patient states overnight she developed shortness of breath. She denies any chest pain. She's also used calamine lotion without relief. She denies any other contacts or ingestions.  Past Medical History  Diagnosis Date  . Coronary artery disease   . MI (myocardial infarction)   . Borderline diabetic     PT STATES NOT DIABETIC--DID HAVE GESTATIONAL DIABETES  . Hypertension   . High cholesterol   . COPD (chronic obstructive pulmonary disease)   . Asthma   . Bipolar 1 disorder   . Fibroids     UTERINE FIBROIDS--PT STATES VAGINAL BLEEDING OFF AND ON WHICH SHE RELATES TO HER FIBROIDS-  . Sleep apnea     DOES NOT USE CPAP--HAS NASAL MASK AT HOME  . Complication of anesthesia     CLAUSTROPHOBIA-STATES DOES NOT TOLERATE NASAL CPAP  . Heart murmur   . Chronic kidney disease     LEFT RENAL CYST  . Urinary, incontinence, stress female   . Arthritis     OA KNEES, SHOULDERS;  DJD   . Anemia   . Hyperlipidemia   . Peripheral vascular disease     CAROTID ARTERY BLOCKAGES-MONITORED BY DR. Piedad Climes CAROTID SURGERY    PT STATES SHE WAS TOLD SHE HAS"BLOOD CLOTS ALL OVER HER BODY"   . Glaucoma     BOTH EYES  . GERD (gastroesophageal reflux disease)   . Leukocytosis   .  Obesity   . Schizophrenia   . Substance abuse     Not specified of ETOH or other substance   Past Surgical History  Procedure Laterality Date  . Cardiac surgery    . Cardiac stents    . Coronary artery bypass graft  2008  . Pinning left foot    . C-sections x 2  1985, 1988  . 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 Right knee arthroscopy    . Eye surgery      MULTIPLE LASER SURGERIES LEFT EYE BECAUSE OF GLAUCOMA  . Total knee arthroplasty Right 10/21/2012    Procedure: TOTAL KNEE ARTHROPLASTY;  Surgeon: Mauri Pole, MD;  Location: [facility];  Service: Orthopedics;  Laterality: Right;   History reviewed. No pertinent family history. History  Substance Use Topics  . Smoking status: Current Every Day Smoker -- 0.50 packs/day for 25 years    Types: Cigarettes  . Smokeless tobacco: Never Used  . Alcohol Use: No   OB History   Grav Para Term Preterm Abortions TAB SAB Ect Mult Living                 Review of Systems  Constitutional: Negative for fever.  HENT: Negative for trouble swallowing and voice change.   Respiratory:  Positive for shortness of breath. Negative for cough, chest tightness and stridor.   Cardiovascular: Negative for chest pain.  Gastrointestinal: Negative for nausea, vomiting and abdominal pain.  Genitourinary: Negative for dysuria.  Musculoskeletal: Negative for back pain.  Skin: Positive for rash. Negative for wound.  Neurological: Negative for headaches.  All other systems reviewed and are negative.     Allergies  Haldol and Amoxicillin  Home Medications   Prior to Admission medications   Medication Sig Start Date End Date Taking? Authorizing Provider  albuterol (PROVENTIL HFA;VENTOLIN HFA) 108 (90 BASE) MCG/ACT inhaler Inhale 2 puffs into the lungs every 6 (six) hours as needed for wheezing.    Historical Provider, MD  aspirin 325 MG tablet Take 81 mg by mouth daily.     Historical Provider, MD   atorvastatin (LIPITOR) 80 MG tablet Take 1 tablet (80 mg total) by mouth at bedtime. 03/28/13   Lorretta Harp, MD  brimonidine (ALPHAGAN) 0.15 % ophthalmic solution Place 1 drop into both eyes 2 (two) times daily.    Historical Provider, MD  buPROPion (WELLBUTRIN XL) 150 MG 24 hr tablet Take 150 mg by mouth daily before breakfast.     Historical Provider, MD  clonazePAM (KLONOPIN) 0.5 MG tablet Take 0.5 mg by mouth 2 (two) times daily.     Historical Provider, MD  diphenhydrAMINE (BENADRYL) 25 mg capsule Take 1 capsule (25 mg total) by mouth every 6 (six) hours as needed for itching, allergies or sleep. 10/22/12   Lucille Passy Babish, PA-C  Ensure Plus (ENSURE PLUS) LIQD Take 237 mLs by mouth 2 (two) times daily between meals.    Historical Provider, MD  ferrous sulfate 325 (65 FE) MG tablet Take 325 mg by mouth daily with breakfast.    Historical Provider, MD  Fluticasone-Salmeterol (ADVAIR) 250-50 MCG/DOSE AEPB Inhale 1 puff into the lungs daily as needed (for shortness of breath per patient).     Historical Provider, MD  isosorbide mononitrate (IMDUR) 30 MG 24 hr tablet Take 1 tablet (30 mg total) by mouth daily. 12/02/13   Lorretta Harp, MD  lamoTRIgine (LAMICTAL) 100 MG tablet Take 100 mg by mouth every evening.     Historical Provider, MD  lamoTRIgine (LAMICTAL) 25 MG tablet Take 50 mg by mouth every evening. Takes 2 tablets along with a 100 mg tablet to equal 150 mg at bedtime    Historical Provider, MD  lisinopril (PRINIVIL,ZESTRIL) 20 MG tablet Take 10 mg by mouth daily before breakfast.     Historical Provider, MD  metoprolol tartrate (LOPRESSOR) 25 MG tablet Take 0.5 tablets (12.5 mg total) by mouth 2 (two) times daily. 12/02/13   Lorretta Harp, MD  niacin (NIASPAN) 1000 MG CR tablet Take 1,000 mg by mouth at bedtime.    Historical Provider, MD  nitroGLYCERIN (NITROSTAT) 0.4 MG SL tablet Place 0.4 mg under the tongue every 5 (five) minutes as needed.    Historical Provider, MD   omega-3 acid ethyl esters (LOVAZA) 1 G capsule TAKE ONE CAPSULE BY MOUTH TWICE A DAY    Lorretta Harp, MD  ondansetron (ZOFRAN) 4 MG tablet Take 1 tablet (4 mg total) by mouth every 8 (eight) hours as needed for nausea. 11/09/12   Charles B. Karle Starch, MD  oxybutynin (DITROPAN-XL) 10 MG 24 hr tablet Take 10 mg by mouth daily. TAKES IN AM    Historical Provider, MD  pantoprazole (PROTONIX) 40 MG tablet Take 1 tablet (40 mg total) by mouth daily. <PLEASE MAKE  APPOINTMENT> 11/03/13   Lorretta Harp, MD  promethazine (PHENERGAN) 25 MG suppository Place 1 suppository (25 mg total) rectally every 6 (six) hours as needed for nausea. 11/10/12   Carmin Muskrat, MD  traZODone (DESYREL) 100 MG tablet Take 100-200 mg by mouth at bedtime as needed for sleep.     Historical Provider, MD  vitamin B-12 (CYANOCOBALAMIN) 100 MCG tablet Take 50 mcg by mouth daily.    Historical Provider, MD  vitamin C (ASCORBIC ACID) 500 MG tablet Take 500 mg by mouth daily.    Historical Provider, MD   BP 166/79  Pulse 69  Temp(Src) 98 F (36.7 C) (Oral)  Resp 16  SpO2 98%  LMP 11/16/2011 Physical Exam  Nursing note and vitals reviewed. Constitutional: She is oriented to person, place, and time. She appears well-developed and well-nourished. No distress.  HENT:  Head: Normocephalic and atraumatic.  Mouth/Throat: Oropharynx is clear and moist.  No pharyngeal edema noted  Eyes: Pupils are equal, round, and reactive to light.  Neck: Neck supple.  Cardiovascular: Normal rate, regular rhythm and normal heart sounds.   No murmur heard. Pulmonary/Chest: Effort normal. No respiratory distress. She has no wheezes.  Abdominal: Soft. Bowel sounds are normal. There is no tenderness. There is no rebound.  Musculoskeletal: She exhibits no edema.  Neurological: She is alert and oriented to person, place, and time.  Skin: Skin is warm and dry. Rash noted.  Blanching erythematous rash covering the entire anterior neck, left for  head, sparing the eyes, and multiple patches over the anterior abdomen, no significant or lesions noted, not in dermatomal distribution  Psychiatric: She has a normal mood and affect.    ED Course  Procedures (including critical care time) Labs Review Labs Reviewed - No data to display  Imaging Review No results found.   EKG Interpretation   Date/Time:  Friday December 12 2013 18:20:02 EDT Ventricular Rate:  53 PR Interval:  217 QRS Duration: 107 QT Interval:  447 QTC Calculation: 420 R Axis:   0 Text Interpretation:  Sinus rhythm Prolonged PR interval RSR' in V1 or V2,  right VCD or RVH Inferior infarct, old Similar to prior Confirmed by  Makynlie Rossini  MD, Carey ([id number]) on 12/12/2013 6:25:19 PM      MDM   Final diagnoses:  Contact dermatitis    Patient presents with rash and shortness of breath. She is nontoxic-appearing on exam. O2 sats is 98%. She does have wheezing but has a history of smoking.  Oropharynx without evidence of swelling or edema. Patient's story most consistent with contact dermatitis likely secondary to poison ivy versus poison oak. Given neck and facial involvement, will give systemic steroids. Patient was also given Atarax and Pepcid. She reports marked improvement of symptoms. Patient was also given albuterol and states that she feels much better. At this time there's not any indication of acute anaphylaxis and patient has had symptoms for 4 days. EKG is reassuring. Will discharge him with the above medications.  Patient was instructed to return immediately she has difficulty swallowing, neck swelling, or increasing shortness of breath.  After history, exam, and medical workup I feel the patient has been appropriately medically screened and is safe for discharge home. Pertinent diagnoses were discussed with the patient. Patient was given return precautions.     Merryl Hacker, MD 12/12/13 [phone]

## 2013-12-12 NOTE — Discharge Instructions (Signed)

## 2013-12-23 ENCOUNTER — Encounter: Payer: Self-pay | Admitting: *Deleted

## 2013-12-24 ENCOUNTER — Ambulatory Visit (INDEPENDENT_AMBULATORY_CARE_PROVIDER_SITE_OTHER): Payer: Medicare Other | Admitting: Cardiovascular Disease

## 2013-12-24 ENCOUNTER — Encounter: Payer: Self-pay | Admitting: Cardiovascular Disease

## 2013-12-24 VITALS — BP 108/68 | HR 55 | Ht 65.0 in | Wt 180.0 lb

## 2013-12-24 DIAGNOSIS — E785 Hyperlipidemia, unspecified: Secondary | ICD-10-CM

## 2013-12-24 DIAGNOSIS — I25810 Atherosclerosis of coronary artery bypass graft(s) without angina pectoris: Secondary | ICD-10-CM

## 2013-12-24 DIAGNOSIS — I25118 Atherosclerotic heart disease of native coronary artery with other forms of angina pectoris: Secondary | ICD-10-CM | POA: Insufficient documentation

## 2013-12-24 NOTE — Assessment & Plan Note (Signed)
A: asymptomatic from cardiac standpoint P:  - continue ASA 81 mg, statin therapy, metoprolol and ACE-I - congratulated on smoking cessation - encouraged continued exercise - f/u in 1 year

## 2013-12-24 NOTE — Patient Instructions (Signed)
Your physician wants you to follow-up in: 1 year with Dr Berry. You will receive a reminder letter in the mail two months in advance. If you don't receive a letter, please call our office to schedule the follow-up appointment.  

## 2013-12-24 NOTE — Progress Notes (Signed)
Patient ID: [name], female   DOB: [birth year], 53 y.o.   MRN: [id number]    12/24/2013 CAMRYNN MCCLINTIC   1961/02/24  [id number]  Primary Physicia Default, Provider, MD Primary Cardiologist: Lorretta Harp, MD  HPI:  53 year old F with CAD and a history of RCA stenting by Dr. Doylene Canard in the past. She underwent a CABG in 2008 with a LIMA to the LAD, and a vein to a ramus and PDA. She had cardiac catheterization by Dr. Gwenlyn Found in 2011that showed patent grafts and normal LV function. She underwent myoview in 2012 which was non-ischemic. She was last seen on 10/09/12. She notes that since that time, she has undergone a successful right knee replacement. She has also stopped smoking, which happened 6 months ago. She has intermittent chest wall pain that she attributes to the metal wires from the sternotomy in 2008. Otherwise, she has no chest pain.  She denies any shortness of breath.    Current Outpatient Prescriptions  Medication Sig Dispense Refill  . albuterol (PROVENTIL HFA;VENTOLIN HFA) 108 (90 BASE) MCG/ACT inhaler Inhale 2 puffs into the lungs every 6 (six) hours as needed for wheezing.      Marland Kitchen aspirin 81 MG chewable tablet Chew 81 mg by mouth daily.      Marland Kitchen atorvastatin (LIPITOR) 80 MG tablet Take 1 tablet (80 mg total) by mouth at bedtime.  90 tablet  2  . brimonidine (ALPHAGAN) 0.15 % ophthalmic solution Place 1 drop into both eyes 2 (two) times daily.      Marland Kitchen buPROPion (WELLBUTRIN SR) 100 MG 12 hr tablet Take 100 mg by mouth daily.      . clonazePAM (KLONOPIN) 0.5 MG tablet Take 0.5 mg by mouth 2 (two) times daily.       . fluticasone (FLONASE) 50 MCG/ACT nasal spray       . Fluticasone-Salmeterol (ADVAIR) 250-50 MCG/DOSE AEPB Inhale 1 puff into the lungs daily.      Marland Kitchen gabapentin (NEURONTIN) 300 MG capsule Take 300 mg by mouth at bedtime.      . isosorbide mononitrate (IMDUR) 30 MG 24 hr tablet Take 1 tablet (30 mg total) by mouth daily.  30 tablet  0  . lamoTRIgine (LAMICTAL) 100  MG tablet Take 200 mg by mouth daily.       Marland Kitchen lisinopril (PRINIVIL,ZESTRIL) 20 MG tablet Take 10 mg by mouth daily before breakfast.       . metoprolol tartrate (LOPRESSOR) 25 MG tablet Take 0.5 tablets (12.5 mg total) by mouth 2 (two) times daily.  30 tablet  0  . Multiple Vitamin (MULTIVITAMIN) capsule Take 1 capsule by mouth daily.      . niacin (NIASPAN) 1000 MG CR tablet Take 1,000 mg by mouth at bedtime.      . nitroGLYCERIN (NITROSTAT) 0.4 MG SL tablet Place 0.4 mg under the tongue every 5 (five) minutes as needed.      Marland Kitchen omega-3 acid ethyl esters (LOVAZA) 1 G capsule TAKE ONE CAPSULE BY MOUTH TWICE A DAY  60 capsule  6  . oxybutynin (DITROPAN-XL) 10 MG 24 hr tablet Take 10 mg by mouth daily. TAKES IN AM      . pantoprazole (PROTONIX) 40 MG tablet Take 1 tablet (40 mg total) by mouth daily. <PLEASE MAKE APPOINTMENT>  90 tablet  0  . QUEtiapine (SEROQUEL) 25 MG tablet Take 50 mg by mouth at bedtime.       . vitamin B-12 (CYANOCOBALAMIN) 100 MCG tablet  Take 50 mcg by mouth daily.      . vitamin C (ASCORBIC ACID) 500 MG tablet Take 500 mg by mouth daily.       No current facility-administered medications for this visit.    Allergies  Allergen Reactions  . Haldol [Haloperidol Decanoate] Anaphylaxis  . Amoxicillin Hives    History   Social History  . Marital Status: Divorced    Spouse Name: N/A    Number of Children: N/A  . Years of Education: N/A   Occupational History  . Not on file.   Social History Main Topics  . Smoking status: Former Smoker -- 0.50 packs/day for 25 years    Types: Cigarettes    Quit date: 03/25/2012  . Smokeless tobacco: Never Used  . Alcohol Use: No  . Drug Use: 2.00 per week    Special: Marijuana     Comment: occ  . Sexual Activity: Not on file   Other Topics Concern  . Not on file   Social History Narrative  . No narrative on file     Review of Systems: General: negative for chills, fever, night sweats or weight changes.    Cardiovascular: negative for chest pain, dyspnea on exertion, edema, orthopnea, palpitations, paroxysmal nocturnal dyspnea or shortness of breath Dermatological: negative for rash Respiratory: negative for cough or wheezing Urologic: negative for hematuria Abdominal: negative for nausea, vomiting, diarrhea, bright red blood per rectum, melena, or hematemesis Neurologic: negative for visual changes, syncope, or dizziness All other systems reviewed and are otherwise negative except as noted above.    Blood pressure 108/68, pulse 55, height 5\' 5"  (1.651 m), weight 180 lb (81.647 kg), last menstrual period 11/16/2011.  General appearance: alert, cooperative, appears stated age and no distress Neck: no adenopathy, no carotid bruit, no JVD and supple, symmetrical, trachea midline Lungs: clear to auscultation bilaterally Heart: regular rate and rhythm, S1, S2 normal and mild outflow track murmur; midline sternotomy scar  Abdomen: soft, non-tender; bowel sounds normal; no masses,  no organomegaly Extremities: extremities normal, atraumatic, no cyanosis or edema  EKG normal sinus rhythm, old q waves in inferior lead  ASSESSMENT AND PLAN:   Hyperlipidemia LDL goal <70 A: pt taking atorvastatin, lovaza and niacin P: cont current meds and have PCP follow up lipids  Coronary artery disease involving coronary bypass graft A: asymptomatic from cardiac standpoint P:  - continue ASA 81 mg, statin therapy, metoprolol and ACE-I - congratulated on smoking cessation - encouraged continued exercise - f/u in 1 year     Talmadge Coventry, [facility] 12/24/2013 11:26 AM  I have seen and evaluated the patient with Dr. Maricela Bo and agree with his assessment and plan. Stable status post coronary bypass grafting. Cardiac risk factors the modified. I will see back in one year for followup.  Lorretta Harp, M.D., [place], [facility], [place] [address] [place], [place]  [address]  [phone] 12/24/2013 12:24 PM

## 2013-12-24 NOTE — Assessment & Plan Note (Signed)
A: pt taking atorvastatin, lovaza and niacin P: cont current meds and have PCP follow up lipids

## 2013-12-31 ENCOUNTER — Other Ambulatory Visit: Payer: Self-pay | Admitting: *Deleted

## 2013-12-31 MED ORDER — ISOSORBIDE MONONITRATE ER 30 MG PO TB24: 30.0000 mg | ORAL_TABLET | Freq: Every day | ORAL | Status: DC

## 2013-12-31 MED ORDER — NIACIN ER (ANTIHYPERLIPIDEMIC) 1000 MG PO TBCR: 1000.0000 mg | EXTENDED_RELEASE_TABLET | Freq: Every day | ORAL | Status: DC

## 2013-12-31 NOTE — Telephone Encounter (Signed)
Rx refill sent to patient pharmacy   

## 2014-01-21 ENCOUNTER — Other Ambulatory Visit: Payer: Self-pay

## 2014-01-21 MED ORDER — ISOSORBIDE MONONITRATE ER 30 MG PO TB24: 30.0000 mg | ORAL_TABLET | Freq: Every day | ORAL | Status: DC

## 2014-01-21 MED ORDER — METOPROLOL TARTRATE 25 MG PO TABS: 12.5000 mg | ORAL_TABLET | Freq: Two times a day (BID) | ORAL | Status: DC

## 2014-01-21 MED ORDER — NIACIN ER (ANTIHYPERLIPIDEMIC) 1000 MG PO TBCR: 1000.0000 mg | EXTENDED_RELEASE_TABLET | Freq: Every day | ORAL | Status: DC

## 2014-01-21 NOTE — Telephone Encounter (Signed)
Rx was sent to pharmacy electronically. 

## 2014-01-22 ENCOUNTER — Other Ambulatory Visit: Payer: Self-pay | Admitting: *Deleted

## 2014-01-22 MED ORDER — PANTOPRAZOLE SODIUM 40 MG PO TBEC: 40.0000 mg | DELAYED_RELEASE_TABLET | Freq: Every day | ORAL | Status: DC

## 2014-01-22 NOTE — Telephone Encounter (Signed)
Rx was sent to pharmacy electronically. 

## 2014-01-27 ENCOUNTER — Other Ambulatory Visit: Payer: Self-pay | Admitting: *Deleted

## 2014-01-27 MED ORDER — ATORVASTATIN CALCIUM 80 MG PO TABS: 80.0000 mg | ORAL_TABLET | Freq: Every day | ORAL | Status: DC

## 2014-01-27 NOTE — Telephone Encounter (Signed)
Rx was sent to pharmacy electronically. 

## 2014-02-18 ENCOUNTER — Other Ambulatory Visit: Payer: Self-pay | Admitting: Cardiovascular Disease

## 2014-03-19 ENCOUNTER — Telehealth: Payer: Self-pay | Admitting: Cardiovascular Disease

## 2014-03-19 NOTE — Telephone Encounter (Signed)
[name] is calling stating that she has loose wire in her chest, it feels like its pinching and a knife sticking through the skin underneath the left breast. Please call   Thanks

## 2014-03-19 NOTE — Telephone Encounter (Signed)
Spoke to patient She states she had surgery @2009  She has not contacted SURGEON, she states she needs a referral  appt made 03/20/14 with [facility].

## 2014-03-20 ENCOUNTER — Ambulatory Visit: Payer: Medicare Other | Admitting: Cardiology

## 2014-05-23 ENCOUNTER — Other Ambulatory Visit: Payer: Self-pay | Admitting: Cardiovascular Disease

## 2014-05-25 NOTE — Telephone Encounter (Signed)
Rx was sent to pharmacy electronically. 

## 2014-06-24 ENCOUNTER — Emergency Department (HOSPITAL_BASED_OUTPATIENT_CLINIC_OR_DEPARTMENT_OTHER): Payer: Medicare Other

## 2014-06-24 ENCOUNTER — Emergency Department (HOSPITAL_BASED_OUTPATIENT_CLINIC_OR_DEPARTMENT_OTHER)
Admission: EM | Admit: 2014-06-24 | Discharge: 2014-06-24 | Disposition: A | Discharge status: Home / Self Care | Payer: Medicare Other | Attending: Emergency Medicine | Admitting: Emergency Medicine

## 2014-06-24 ENCOUNTER — Encounter (HOSPITAL_BASED_OUTPATIENT_CLINIC_OR_DEPARTMENT_OTHER): Payer: Self-pay

## 2014-06-24 DIAGNOSIS — Z951 Presence of aortocoronary bypass graft: Secondary | ICD-10-CM | POA: Diagnosis not present

## 2014-06-24 DIAGNOSIS — I509 Heart failure, unspecified: Secondary | ICD-10-CM | POA: Diagnosis not present

## 2014-06-24 DIAGNOSIS — I2510 Atherosclerotic heart disease of native coronary artery without angina pectoris: Secondary | ICD-10-CM | POA: Diagnosis not present

## 2014-06-24 DIAGNOSIS — Z79899 Other long term (current) drug therapy: Secondary | ICD-10-CM | POA: Insufficient documentation

## 2014-06-24 DIAGNOSIS — N189 Chronic kidney disease, unspecified: Secondary | ICD-10-CM | POA: Insufficient documentation

## 2014-06-24 DIAGNOSIS — S0003XA Contusion of scalp, initial encounter: Secondary | ICD-10-CM | POA: Diagnosis not present

## 2014-06-24 DIAGNOSIS — Z880 Allergy status to penicillin: Secondary | ICD-10-CM | POA: Diagnosis not present

## 2014-06-24 DIAGNOSIS — R079 Chest pain, unspecified: Secondary | ICD-10-CM | POA: Diagnosis not present

## 2014-06-24 DIAGNOSIS — E669 Obesity, unspecified: Secondary | ICD-10-CM | POA: Insufficient documentation

## 2014-06-24 DIAGNOSIS — E780 Pure hypercholesterolemia: Secondary | ICD-10-CM | POA: Insufficient documentation

## 2014-06-24 DIAGNOSIS — Z87891 Personal history of nicotine dependence: Secondary | ICD-10-CM | POA: Diagnosis not present

## 2014-06-24 DIAGNOSIS — Z7982 Long term (current) use of aspirin: Secondary | ICD-10-CM | POA: Insufficient documentation

## 2014-06-24 DIAGNOSIS — D649 Anemia, unspecified: Secondary | ICD-10-CM | POA: Insufficient documentation

## 2014-06-24 DIAGNOSIS — Z79891 Long term (current) use of opiate analgesic: Secondary | ICD-10-CM | POA: Insufficient documentation

## 2014-06-24 DIAGNOSIS — F209 Schizophrenia, unspecified: Secondary | ICD-10-CM | POA: Diagnosis not present

## 2014-06-24 DIAGNOSIS — S79912A Unspecified injury of left hip, initial encounter: Secondary | ICD-10-CM | POA: Diagnosis not present

## 2014-06-24 DIAGNOSIS — R4020 Unspecified coma: Secondary | ICD-10-CM | POA: Diagnosis present

## 2014-06-24 DIAGNOSIS — I129 Hypertensive chronic kidney disease with stage 1 through stage 4 chronic kidney disease, or unspecified chronic kidney disease: Secondary | ICD-10-CM | POA: Insufficient documentation

## 2014-06-24 DIAGNOSIS — J449 Chronic obstructive pulmonary disease, unspecified: Secondary | ICD-10-CM | POA: Insufficient documentation

## 2014-06-24 DIAGNOSIS — W01198A Fall on same level from slipping, tripping and stumbling with subsequent striking against other object, initial encounter: Secondary | ICD-10-CM | POA: Insufficient documentation

## 2014-06-24 DIAGNOSIS — E785 Hyperlipidemia, unspecified: Secondary | ICD-10-CM | POA: Insufficient documentation

## 2014-06-24 DIAGNOSIS — K219 Gastro-esophageal reflux disease without esophagitis: Secondary | ICD-10-CM | POA: Diagnosis not present

## 2014-06-24 DIAGNOSIS — I252 Old myocardial infarction: Secondary | ICD-10-CM | POA: Diagnosis not present

## 2014-06-24 DIAGNOSIS — R55 Syncope and collapse: Secondary | ICD-10-CM

## 2014-06-24 DIAGNOSIS — F319 Bipolar disorder, unspecified: Secondary | ICD-10-CM | POA: Diagnosis not present

## 2014-06-24 DIAGNOSIS — S301XXA Contusion of abdominal wall, initial encounter: Secondary | ICD-10-CM | POA: Diagnosis not present

## 2014-06-24 DIAGNOSIS — W19XXXA Unspecified fall, initial encounter: Secondary | ICD-10-CM

## 2014-06-24 DIAGNOSIS — S3992XA Unspecified injury of lower back, initial encounter: Secondary | ICD-10-CM | POA: Insufficient documentation

## 2014-06-24 DIAGNOSIS — Z86018 Personal history of other benign neoplasm: Secondary | ICD-10-CM | POA: Insufficient documentation

## 2014-06-24 DIAGNOSIS — R011 Cardiac murmur, unspecified: Secondary | ICD-10-CM | POA: Insufficient documentation

## 2014-06-24 DIAGNOSIS — Z7951 Long term (current) use of inhaled steroids: Secondary | ICD-10-CM | POA: Insufficient documentation

## 2014-06-24 DIAGNOSIS — S299XXA Unspecified injury of thorax, initial encounter: Secondary | ICD-10-CM | POA: Diagnosis not present

## 2014-06-24 HISTORY — DX: Heart failure, unspecified: I50.9

## 2014-06-24 LAB — URINALYSIS, ROUTINE W REFLEX MICROSCOPIC
Bilirubin Urine: NEGATIVE
Glucose, UA: NEGATIVE mg/dL
Ketones, ur: NEGATIVE mg/dL
Leukocytes, UA: NEGATIVE
Nitrite: NEGATIVE
Protein, ur: NEGATIVE mg/dL
Specific Gravity, Urine: 1.021 (ref 1.005–1.030)
Urobilinogen, UA: 0.2 mg/dL (ref 0.0–1.0)
pH: 5.5 (ref 5.0–8.0)

## 2014-06-24 LAB — COMPREHENSIVE METABOLIC PANEL
ALT: 19 U/L (ref 0–35)
AST: 18 U/L (ref 0–37)
Albumin: 3.1 g/dL — ABNORMAL LOW (ref 3.5–5.2)
Alkaline Phosphatase: 87 U/L (ref 39–117)
Anion gap: 10 (ref 5–15)
BUN: 21 mg/dL (ref 6–23)
CO2: 25 mEq/L (ref 19–32)
Calcium: 8.6 mg/dL (ref 8.4–10.5)
Chloride: 105 mEq/L (ref 96–112)
Creatinine, Ser: 1.1 mg/dL (ref 0.50–1.10)
GFR calc Af Amer: 66 mL/min — ABNORMAL LOW (ref >90)
GFR calc non Af Amer: 57 mL/min — ABNORMAL LOW (ref >90)
Glucose, Bld: 96 mg/dL (ref 70–99)
Potassium: 3.8 mEq/L (ref 3.7–5.3)
Sodium: 140 mEq/L (ref 137–147)
Total Bilirubin: <0.2 mg/dL — ABNORMAL LOW (ref 0.3–1.2)
Total Protein: 6.1 g/dL (ref 6.0–8.3)

## 2014-06-24 LAB — CBC WITH DIFFERENTIAL/PLATELET
Basophils Absolute: 0.1 10*3/uL (ref 0.0–0.1)
Basophils Relative: 1 % (ref 0–1)
Eosinophils Absolute: 0.5 10*3/uL (ref 0.0–0.7)
Eosinophils Relative: 6 % — ABNORMAL HIGH (ref 0–5)
HCT: 39.9 % (ref 36.0–46.0)
Hemoglobin: 13.9 g/dL (ref 12.0–15.0)
Lymphocytes Relative: 39 % (ref 12–46)
Lymphs Abs: 3.2 10*3/uL (ref 0.7–4.0)
MCH: 34.6 pg — ABNORMAL HIGH (ref 26.0–34.0)
MCHC: 34.8 g/dL (ref 30.0–36.0)
MCV: 99.3 fL (ref 78.0–100.0)
Monocytes Absolute: 0.9 10*3/uL (ref 0.1–1.0)
Monocytes Relative: 11 % (ref 3–12)
Neutro Abs: 3.6 10*3/uL (ref 1.7–7.7)
Neutrophils Relative %: 43 % (ref 43–77)
Platelets: 228 10*3/uL (ref 150–400)
RBC: 4.02 MIL/uL (ref 3.87–5.11)
RDW: 14.1 % (ref 11.5–15.5)
WBC: 8.2 10*3/uL (ref 4.0–10.5)

## 2014-06-24 LAB — URINE MICROSCOPIC-ADD ON

## 2014-06-24 LAB — TROPONIN I: Troponin I: <0.3 ng/mL (ref <0.30)

## 2014-06-24 MED ORDER — SODIUM CHLORIDE 0.9 % IV BOLUS (SEPSIS)
1000.0000 mL | Freq: Once | INTRAVENOUS | Status: AC
  Administered 2014-06-24: 1000 mL via INTRAVENOUS

## 2014-06-24 MED ORDER — IOHEXOL 300 MG/ML  SOLN
100.0000 mL | Freq: Once | INTRAMUSCULAR | Status: AC | PRN
  Administered 2014-06-24: 100 mL via INTRAVENOUS

## 2014-06-24 MED ORDER — HYDROMORPHONE HCL 1 MG/ML IJ SOLN
1.0000 mg | Freq: Once | INTRAMUSCULAR | Status: AC
  Administered 2014-06-24: 1 mg via INTRAVENOUS
  Filled 2014-06-24: qty 1

## 2014-06-24 MED ORDER — HYDROCODONE-ACETAMINOPHEN 5-325 MG PO TABS: 1.0000 | ORAL_TABLET | ORAL | Status: DC | PRN

## 2014-06-24 MED ORDER — CYCLOBENZAPRINE HCL 10 MG PO TABS
10.0000 mg | ORAL_TABLET | Freq: Two times a day (BID) | ORAL | Status: DC | PRN
Start: 2014-06-24 — End: 2015-03-17

## 2014-06-24 NOTE — Discharge Instructions (Signed)
Stay hydrated.   Take motrin for pain.   Take vicodin for severe pain. Do NOT drive with it.   Take flexeril for muscle spasms.   Follow up with your doctor.   Return to ER if you have severe pain, vomiting, unable to walk.

## 2014-06-24 NOTE — ED Notes (Signed)
Patient transported to CT 

## 2014-06-24 NOTE — ED Provider Notes (Addendum)
CSN: [id number]     Arrival date & time 06/24/14  1652 History   First MD Initiated Contact with Patient 06/24/14 1729     Chief Complaint  Patient presents with  . Loss of Consciousness     (Consider location/radiation/quality/duration/timing/severity/associated sxs/prior Treatment) The history is provided by the patient.  [name] is a 53 y.o. female hx of CAD s/p MI and CABG, HTN here with syncope. Patient passed out in the shower 3 days ago. [place] and hit her head. Afterwards was complaining of left flank pain and left hip pain and back pain. The pain radiates up to the right chest but denies any chest injury. States that this is different than her previous heart attacks. No further syncope episodes since 3 days ago.   Past Medical History  Diagnosis Date  . Coronary artery disease   . MI (myocardial infarction)   . Borderline diabetic     PT STATES NOT DIABETIC--DID HAVE GESTATIONAL DIABETES  . High cholesterol   . COPD (chronic obstructive pulmonary disease)   . Asthma   . Bipolar 1 disorder   . Fibroids     UTERINE FIBROIDS--PT STATES VAGINAL BLEEDING OFF AND ON WHICH SHE RELATES TO HER FIBROIDS-  . Sleep apnea     DOES NOT USE CPAP--HAS NASAL MASK AT HOME  . Complication of anesthesia     CLAUSTROPHOBIA-STATES DOES NOT TOLERATE NASAL CPAP  . Heart murmur   . Chronic kidney disease     LEFT RENAL CYST  . Urinary, incontinence, stress female   . Arthritis     OA KNEES, SHOULDERS;  DJD   . Anemia   . Hyperlipidemia   . Peripheral vascular disease     CAROTID ARTERY BLOCKAGES-MONITORED BY DR. Piedad Climes CAROTID SURGERY    PT STATES SHE WAS TOLD SHE HAS"BLOOD CLOTS ALL OVER HER BODY"   . Glaucoma     BOTH EYES  . GERD (gastroesophageal reflux disease)   . Leukocytosis   . Obesity   . Schizophrenia   . Substance abuse     Not specified of ETOH or other substance  . S/P CABG x 3 2008  . CHF (congestive heart failure)    Past Surgical History  Procedure  Laterality Date  . Cardiac catheterization      RCA stent (Dr. Doylene Canard)  . Coronary artery bypass graft  07/16/2007    LIMA to LAD, VG to ramus, VG to PDA (Dr. Prescott Gum)  . Pinning left foot    . C-sections x 2  1985, 1988  . 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 Right knee arthroscopy    . Eye surgery      MULTIPLE LASER SURGERIES LEFT EYE BECAUSE OF GLAUCOMA  . Total knee arthroplasty Right 10/21/2012    Procedure: TOTAL KNEE ARTHROPLASTY;  Surgeon: Mauri Pole, MD;  Location: [facility];  Service: Orthopedics;  Laterality: Right;  . Transthoracic echocardiogram  09/2007    EF=>55%, mild conc LVH; LA mildly dilated; mild MR  . Nm myocar perf wall motion  03/2011    lexiscan - perfusion defect in anterior region (breast attenuation), remaining myocardium with normal perfusion, EF 69%, low risk  . Cardiac catheterization  03/04/2010    patent grafts, normal LV function, diffusely diseased RCA (Dr. Adora Fridge)  . Carotid doppler  07/2007    R & L ICA with 0-49% diameter reduction  .  Sleep study  1/42009    AHI 20.64/hr and 46.74/hr during REM   No family history on file. History  Substance Use Topics  . Smoking status: Former Smoker -- 0.00 packs/day for 0 years    Quit date: 03/25/2012  . Smokeless tobacco: Never Used  . Alcohol Use: No   OB History    No data available     Review of Systems  Cardiovascular: Positive for syncope.  Musculoskeletal: Positive for back pain.       L hip pain   Neurological: Positive for syncope.  All other systems reviewed and are negative.     Allergies  Haldol and Amoxicillin  Home Medications   Prior to Admission medications   Medication Sig Start Date End Date Taking? Authorizing Provider  albuterol (PROVENTIL HFA;VENTOLIN HFA) 108 (90 BASE) MCG/ACT inhaler Inhale 2 puffs into the lungs every 6 (six) hours as needed for wheezing.    Historical Provider, MD  aspirin 81 MG chewable  tablet Chew 81 mg by mouth daily.    Historical Provider, MD  atorvastatin (LIPITOR) 80 MG tablet TAKE 1 TABLET BY MOUTH AT BEDTIME    Lorretta Harp, MD  brimonidine (ALPHAGAN) 0.15 % ophthalmic solution Place 1 drop into both eyes 2 (two) times daily.    Historical Provider, MD  buPROPion (WELLBUTRIN SR) 100 MG 12 hr tablet Take 100 mg by mouth daily.    Historical Provider, MD  clonazePAM (KLONOPIN) 0.5 MG tablet Take 0.5 mg by mouth 2 (two) times daily.     Historical Provider, MD  fluticasone Asencion Islam) 50 MCG/ACT nasal spray  12/21/13   Historical Provider, MD  Fluticasone-Salmeterol (ADVAIR) 250-50 MCG/DOSE AEPB Inhale 1 puff into the lungs daily.    Historical Provider, MD  gabapentin (NEURONTIN) 300 MG capsule Take 300 mg by mouth at bedtime.    Historical Provider, MD  isosorbide mononitrate (IMDUR) 30 MG 24 hr tablet Take 1 tablet (30 mg total) by mouth daily. 01/21/14   Lorretta Harp, MD  lamoTRIgine (LAMICTAL) 100 MG tablet Take 200 mg by mouth daily.     Historical Provider, MD  lisinopril (PRINIVIL,ZESTRIL) 20 MG tablet TAKE 1 TABLET EVERY DAY    Lorretta Harp, MD  metoprolol tartrate (LOPRESSOR) 25 MG tablet Take 0.5 tablets (12.5 mg total) by mouth 2 (two) times daily. 01/21/14   Lorretta Harp, MD  Multiple Vitamin (MULTIVITAMIN) capsule Take 1 capsule by mouth daily.    Historical Provider, MD  niacin (NIASPAN) 1000 MG CR tablet Take 1 tablet (1,000 mg total) by mouth at bedtime. 01/21/14   Lorretta Harp, MD  nitroGLYCERIN (NITROSTAT) 0.4 MG SL tablet Place 0.4 mg under the tongue every 5 (five) minutes as needed.    Historical Provider, MD  omega-3 acid ethyl esters (LOVAZA) 1 G capsule TAKE ONE CAPSULE BY MOUTH TWICE A DAY 05/25/14   Lorretta Harp, MD  oxybutynin (DITROPAN-XL) 10 MG 24 hr tablet Take 10 mg by mouth daily. TAKES IN AM    Historical Provider, MD  pantoprazole (PROTONIX) 40 MG tablet Take 1 tablet (40 mg total) by mouth daily. 01/22/14   Lorretta Harp, MD   QUEtiapine (SEROQUEL) 25 MG tablet Take 50 mg by mouth at bedtime.  12/01/13   Historical Provider, MD  vitamin B-12 (CYANOCOBALAMIN) 100 MCG tablet Take 50 mcg by mouth daily.    Historical Provider, MD  vitamin C (ASCORBIC ACID) 500 MG tablet Take 500 mg by mouth daily.  Historical Provider, MD   BP 119/68 mmHg  Pulse 50  Temp(Src) 98.5 F (36.9 C) (Oral)  Resp 16  Ht 5\' 5"  (1.651 m)  Wt 170 lb (77.111 kg)  BMI 28.29 kg/m2  SpO2 98%  LMP 11/16/2011 Physical Exam  Constitutional: She is oriented to person, place, and time.  Slightly uncomfortable   HENT:  Head: Normocephalic.  Mouth/Throat: Oropharynx is clear and moist.  Small posterior scalp hematoma   Eyes: Conjunctivae are normal. Pupils are equal, round, and reactive to light.  Neck: Normal range of motion. Neck supple.  Cardiovascular: Normal rate, regular rhythm and normal heart sounds.   Pulmonary/Chest: Effort normal and breath sounds normal. No respiratory distress. She has no wheezes. She has no rales.  Mild reproducible tenderness R of sternum   Abdominal: Soft. Bowel sounds are normal. She exhibits no distension. There is no tenderness. There is no rebound and no guarding.  Bruise in the L flank area with ecchymosis.   Musculoskeletal: Normal range of motion. She exhibits no edema or tenderness.  + upper lumbar tenderness   Neurological: She is alert and oriented to person, place, and time. No cranial nerve deficit. Coordination normal.  Cn 2-12 intact. Nl strength throughout   Skin: Skin is warm and dry.  Psychiatric: She has a normal mood and affect. Her behavior is normal. Judgment and thought content normal.  Nursing note and vitals reviewed.   ED Course  Procedures (including critical care time) Labs Review Labs Reviewed  CBC WITH DIFFERENTIAL - Abnormal; Notable for the following:    MCH 34.6 (*)    Eosinophils Relative 6 (*)    All other components within normal limits  COMPREHENSIVE METABOLIC  PANEL - Abnormal; Notable for the following:    Albumin 3.1 (*)    Total Bilirubin <0.2 (*)    GFR calc non Af Amer 57 (*)    GFR calc Af Amer 66 (*)    All other components within normal limits  URINALYSIS, ROUTINE W REFLEX MICROSCOPIC - Abnormal; Notable for the following:    APPearance CLOUDY (*)    Hgb urine dipstick SMALL (*)    All other components within normal limits  URINE MICROSCOPIC-ADD ON - Abnormal; Notable for the following:    Squamous Epithelial / LPF FEW (*)    Bacteria, UA FEW (*)    All other components within normal limits  TROPONIN I    Imaging Review Ct Head Wo Contrast  06/24/2014   CLINICAL DATA:  Initial encounter for syncopal episode 3 days ago. [place] [address] and struck head. Headache left-sided neck pain.  EXAM: CT HEAD WITHOUT CONTRAST  CT CERVICAL SPINE WITHOUT CONTRAST  TECHNIQUE: Multidetector CT imaging of the head and cervical spine was performed following the standard protocol without intravenous contrast. Multiplanar CT image reconstructions of the cervical spine were also generated.  COMPARISON:  None.  FINDINGS: CT HEAD FINDINGS  There is no evidence for acute hemorrhage, hydrocephalus, mass lesion, or abnormal extra-axial fluid collection. No definite CT evidence for acute infarction. The visualized paranasal sinuses and mastoid air cells are clear.  CT CERVICAL SPINE FINDINGS  Imaging was obtained from the skullbase through the T1-2 interspace. No evidence for fracture. No subluxation. Loss of disc height is seen at C5-6. The facets are well aligned bilaterally. There is no prevertebral soft tissue edema. Straightening of the normal cervical lordosis is evident.  IMPRESSION: No acute intracranial abnormality.  No evidence for cervical spine fracture.  Loss of cervical lordosis.  This can be related to patient positioning, muscle spasm or soft tissue injury.   Electronically Signed   By: Misty Stanley M.D.   On: 06/24/2014 20:35   Ct Chest W Contrast  06/24/2014    CLINICAL DATA:  Syncopal episode on Sunday.  Fall.  Nausea.  EXAM: CT CHEST, ABDOMEN, AND PELVIS WITH CONTRAST  TECHNIQUE: Multidetector CT imaging of the chest, abdomen and pelvis was performed following the standard protocol during bolus administration of intravenous contrast.  CONTRAST:  160mL OMNIPAQUE IOHEXOL 300 MG/ML  SOLN  COMPARISON:  11/09/2012  FINDINGS: CT CHEST FINDINGS  Heart: Heart size is normal. Coronary artery calcification is noted.  Vascular structures: Normal arch anatomy. No evidence for mediastinal injury.  Mediastinum/thyroid: No mediastinal, hilar, or axillary adenopathy. The visualized portion of the thyroid gland has a normal appearance.  Lungs/Airways: No pneumothorax. No pleural effusion. No focal consolidation or contusion.  Chest wall/osseous structures: Previous median sternotomy. No acute fracture.  CT ABDOMEN AND PELVIS FINDINGS  Upper abdomen: No focal abnormality identified within the liver, spleen, pancreas, or adrenal glands. There are small bilateral renal cysts. There is symmetric bilateral renal excretion. The gallbladder is present.  Gastrointestinal tract: The stomach and small bowel loops have a normal appearance. Colonic loops are normal in appearance.  Pelvis: The uterus is present. There is no adnexal mass or free pelvic fluid.  Retroperitoneum: No retroperitoneal or mesenteric adenopathy. No evidence for aortic aneurysm. There is atherosclerotic calcification of the abdominal aorta which measures maximum of 1.9 cm in diameter.  Abdominal wall: Unremarkable.  Osseous structures: Unremarkable.  IMPRESSION: 1. No evidence for acute abnormality of the chest. 2. Coronary artery calcification noted. 3. Prior median sternotomy. 4. No evidence for acute abnormality of the pelvis.   Electronically Signed   By: Shon Hale M.D.   On: 06/24/2014 20:30   Ct Cervical Spine Wo Contrast  06/24/2014   CLINICAL DATA:  Initial encounter for syncopal episode 3 days ago. [place] [address] and  struck head. Headache left-sided neck pain.  EXAM: CT HEAD WITHOUT CONTRAST  CT CERVICAL SPINE WITHOUT CONTRAST  TECHNIQUE: Multidetector CT imaging of the head and cervical spine was performed following the standard protocol without intravenous contrast. Multiplanar CT image reconstructions of the cervical spine were also generated.  COMPARISON:  None.  FINDINGS: CT HEAD FINDINGS  There is no evidence for acute hemorrhage, hydrocephalus, mass lesion, or abnormal extra-axial fluid collection. No definite CT evidence for acute infarction. The visualized paranasal sinuses and mastoid air cells are clear.  CT CERVICAL SPINE FINDINGS  Imaging was obtained from the skullbase through the T1-2 interspace. No evidence for fracture. No subluxation. Loss of disc height is seen at C5-6. The facets are well aligned bilaterally. There is no prevertebral soft tissue edema. Straightening of the normal cervical lordosis is evident.  IMPRESSION: No acute intracranial abnormality.  No evidence for cervical spine fracture.  Loss of cervical lordosis. This can be related to patient positioning, muscle spasm or soft tissue injury.   Electronically Signed   By: Misty Stanley M.D.   On: 06/24/2014 20:35   Ct Abdomen Pelvis W Contrast  06/24/2014   CLINICAL DATA:  Syncopal episode on Sunday.  Fall.  Nausea.  EXAM: CT CHEST, ABDOMEN, AND PELVIS WITH CONTRAST  TECHNIQUE: Multidetector CT imaging of the chest, abdomen and pelvis was performed following the standard protocol during bolus administration of intravenous contrast.  CONTRAST:  140mL OMNIPAQUE IOHEXOL 300 MG/ML  SOLN  COMPARISON:  11/09/2012  FINDINGS: CT CHEST FINDINGS  Heart: Heart size is normal. Coronary artery calcification is noted.  Vascular structures: Normal arch anatomy. No evidence for mediastinal injury.  Mediastinum/thyroid: No mediastinal, hilar, or axillary adenopathy. The visualized portion of the thyroid gland has a normal appearance.  Lungs/Airways: No  pneumothorax. No pleural effusion. No focal consolidation or contusion.  Chest wall/osseous structures: Previous median sternotomy. No acute fracture.  CT ABDOMEN AND PELVIS FINDINGS  Upper abdomen: No focal abnormality identified within the liver, spleen, pancreas, or adrenal glands. There are small bilateral renal cysts. There is symmetric bilateral renal excretion. The gallbladder is present.  Gastrointestinal tract: The stomach and small bowel loops have a normal appearance. Colonic loops are normal in appearance.  Pelvis: The uterus is present. There is no adnexal mass or free pelvic fluid.  Retroperitoneum: No retroperitoneal or mesenteric adenopathy. No evidence for aortic aneurysm. There is atherosclerotic calcification of the abdominal aorta which measures maximum of 1.9 cm in diameter.  Abdominal wall: Unremarkable.  Osseous structures: Unremarkable.  IMPRESSION: 1. No evidence for acute abnormality of the chest. 2. Coronary artery calcification noted. 3. Prior median sternotomy. 4. No evidence for acute abnormality of the pelvis.   Electronically Signed   By: Shon Hale M.D.   On: 06/24/2014 20:30     EKG Interpretation   Date/Time:  Wednesday June 24 2014 17:31:05 EST Ventricular Rate:  49 PR Interval:  244 QRS Duration: 96 QT Interval:  448 QTC Calculation: 404 R Axis:   22 Text Interpretation:  Sinus bradycardia with 1st degree A-V block Possible  Left atrial enlargement Incomplete right bundle branch block Inferior  infarct , age undetermined Abnormal ECG No significant change since last  tracing Confirmed by [place]  MD, DAVID ([id number]) on 06/24/2014 6:00:30 PM      MDM   Final diagnoses:  Cameka Rae is a 53 y.o. female here with syncope 3 days ago. Will get orthostatics, labs. Will do trauma scan given ecchymosis L flank, small scalp hematoma.   9:09 PM Trauma scan showed no fracture. Pain improved with pain meds. Trop neg and syncope was 3 days ago. I doubt  ACS or MI causing syncope. Will d/c home with vicodin, flexeril.    Wandra Arthurs, MD 06/24/14 2109  Wandra Arthurs, MD 06/24/14 2110

## 2014-06-24 NOTE — ED Notes (Signed)
C/o syncope Sunday-hx of same in he past-did not seek medical tx-here today due to pain to back of head, left hip/buttock, mid back, right chest-pt with steady gait into triage

## 2014-06-30 ENCOUNTER — Ambulatory Visit: Payer: Medicare Other | Admitting: Cardiovascular Disease

## 2014-08-25 ENCOUNTER — Ambulatory Visit: Payer: Medicare Other | Admitting: Cardiovascular Disease

## 2014-12-24 ENCOUNTER — Other Ambulatory Visit: Payer: Self-pay | Admitting: Cardiovascular Disease

## 2014-12-24 NOTE — Telephone Encounter (Signed)
Rx(s) sent to pharmacy electronically.  

## 2015-01-08 ENCOUNTER — Other Ambulatory Visit: Payer: Self-pay | Admitting: Cardiovascular Disease

## 2015-01-26 ENCOUNTER — Other Ambulatory Visit: Payer: Self-pay | Admitting: Cardiovascular Disease

## 2015-01-26 NOTE — Telephone Encounter (Signed)
Rx(s) sent to pharmacy electronically.  

## 2015-02-05 ENCOUNTER — Other Ambulatory Visit: Payer: Self-pay | Admitting: Cardiovascular Disease

## 2015-02-05 NOTE — Telephone Encounter (Signed)
Rx(s) sent to pharmacy electronically.  

## 2015-03-11 ENCOUNTER — Ambulatory Visit: Payer: Medicare Other | Admitting: Cardiovascular Disease

## 2015-03-17 ENCOUNTER — Ambulatory Visit (INDEPENDENT_AMBULATORY_CARE_PROVIDER_SITE_OTHER): Payer: Medicare Other | Admitting: Cardiovascular Disease

## 2015-03-17 ENCOUNTER — Encounter: Payer: Self-pay | Admitting: Cardiovascular Disease

## 2015-03-17 VITALS — BP 130/76 | Ht 65.0 in | Wt 191.1 lb

## 2015-03-17 DIAGNOSIS — E785 Hyperlipidemia, unspecified: Secondary | ICD-10-CM

## 2015-03-17 DIAGNOSIS — Z79899 Other long term (current) drug therapy: Secondary | ICD-10-CM

## 2015-03-17 DIAGNOSIS — I10 Essential (primary) hypertension: Secondary | ICD-10-CM | POA: Diagnosis not present

## 2015-03-17 MED ORDER — NITROGLYCERIN 0.4 MG SL SUBL: 0.4000 mg | SUBLINGUAL_TABLET | SUBLINGUAL | Status: DC | PRN

## 2015-03-17 MED ORDER — ISOSORBIDE MONONITRATE ER 30 MG PO TB24: ORAL_TABLET | ORAL | Status: DC

## 2015-03-17 MED ORDER — ATORVASTATIN CALCIUM 80 MG PO TABS: 80.0000 mg | ORAL_TABLET | Freq: Every day | ORAL | Status: DC

## 2015-03-17 MED ORDER — LISINOPRIL 10 MG PO TABS: 10.0000 mg | ORAL_TABLET | Freq: Every day | ORAL | Status: DC

## 2015-03-17 MED ORDER — NIACIN ER (ANTIHYPERLIPIDEMIC) 1000 MG PO TBCR: EXTENDED_RELEASE_TABLET | ORAL | Status: DC

## 2015-03-17 MED ORDER — METOPROLOL TARTRATE 25 MG PO TABS: 12.5000 mg | ORAL_TABLET | Freq: Two times a day (BID) | ORAL | Status: DC

## 2015-03-17 NOTE — Patient Instructions (Signed)
Medication Instructions:   Decrease Lisinopril to 10mg  daily  Labwork:  A FASTING lipid profile: to be done at your convenience.  There is a Psychologist, forensic lab on the first floor of this building, [address].  They are open from 8am-5pm with a lunch from 12-2.  You do not need an appointment.     Testing/Procedures:  n/a  Follow-Up:  1 year with Dr Gwenlyn Found  Any Other Special Instructions Will Be Listed Below (If Applicable).

## 2015-03-17 NOTE — Assessment & Plan Note (Signed)
History of hypertension blood pressure measured at 130/76. She is on lisinopril 20 mg a day. She says her blood pressure at home is much lower than this and she feels somewhat lethargic. I'm going to cut her lisinopril in half

## 2015-03-17 NOTE — Assessment & Plan Note (Signed)
History of coronary artery disease status post bypass grafting in 2008 with a LIMA to LAD, vein graft to a ramus branch and to the RCA. She did have a stent in her distal RCA prior to this. I cathed her 03/04/10 revealing patent grafts and normal LV function. She denies chest pain but does complain of some shortness of breath which I suspect is related to increased weight.

## 2015-03-17 NOTE — Progress Notes (Signed)
03/17/2015 [name]   1961-05-24  [id number]  Primary Physician Default, Provider, MD Primary Cardiologist: Lorretta Harp MD Renae Gloss   HPI:  [name] is a 54 year old severely overweight divorced Caucasian female mother of 2, grandmother and 3 grandchildren who I last saw in the office 08/21/11. She has a history of RCA stenting by Dr. Dr. Doylene Canard in the past. She had coronary artery bypass grafting November 2008 with LIMA to LAD, vein to ramus branch and PDA. She recently stopped smoking 4 months ago. Her palms include hypertension, hyperlipidemia and obstructive sleep apnea intolerant to see. I catheterized her 03/04/10 revealing patent grafts and normal LV function. Her most recent Myoview performed 04/18/11 with nonischemic.   Current Outpatient Prescriptions  Medication Sig Dispense Refill  . albuterol (PROVENTIL HFA;VENTOLIN HFA) 108 (90 BASE) MCG/ACT inhaler Inhale 2 puffs into the lungs every 6 (six) hours as needed for wheezing.    Marland Kitchen aspirin 81 MG chewable tablet Chew 81 mg by mouth daily.    Marland Kitchen atorvastatin (LIPITOR) 80 MG tablet Take 1 tablet (80 mg total) by mouth at bedtime. NEEDS APPOINTMENT FOR FUTURE REFILLS 15 tablet 0  . brimonidine (ALPHAGAN) 0.15 % ophthalmic solution Place 1 drop into both eyes 2 (two) times daily.    Marland Kitchen buPROPion (WELLBUTRIN SR) 100 MG 12 hr tablet Take 100 mg by mouth daily.    . fluticasone (FLONASE) 50 MCG/ACT nasal spray     . Fluticasone-Salmeterol (ADVAIR) 250-50 MCG/DOSE AEPB Inhale 1 puff into the lungs daily.    Marland Kitchen gabapentin (NEURONTIN) 300 MG capsule Take 300 mg by mouth at bedtime.    . isosorbide mononitrate (IMDUR) 30 MG 24 hr tablet TAKE 1 TABLET (30 MG TOTAL) BY MOUTH DAILY. 90 tablet 0  . lamoTRIgine (LAMICTAL) 100 MG tablet Take 200 mg by mouth daily.     Marland Kitchen lisinopril (PRINIVIL,ZESTRIL) 20 MG tablet Take 1 tablet (20 mg total) by mouth daily. PATIENT NEEDS TO CONTACT OFFICE FOR ADDITIONAL REFILLS 90  tablet 0  . metoprolol tartrate (LOPRESSOR) 25 MG tablet Take 0.5 tablets (12.5 mg total) by mouth 2 (two) times daily. <PLEASE MAKE APPOINTMENT FOR REFILLS> 90 tablet 0  . Multiple Vitamin (MULTIVITAMIN) capsule Take 1 capsule by mouth daily.    . niacin (NIASPAN) 1000 MG CR tablet TAKE 1 TABLET (1,000 MG TOTAL) BY MOUTH AT BEDTIME. 30 tablet 1  . nitroGLYCERIN (NITROSTAT) 0.4 MG SL tablet Place 0.4 mg under the tongue every 5 (five) minutes as needed.    Marland Kitchen omega-3 acid ethyl esters (LOVAZA) 1 G capsule TAKE ONE CAPSULE BY MOUTH TWICE A DAY 60 capsule 7  . oxybutynin (DITROPAN-XL) 10 MG 24 hr tablet Take 10 mg by mouth daily. TAKES IN AM    . pantoprazole (PROTONIX) 40 MG tablet Take 1 tablet (40 mg total) by mouth daily. 90 tablet 3  . QUEtiapine (SEROQUEL) 25 MG tablet Take 50 mg by mouth at bedtime.     . vitamin B-12 (CYANOCOBALAMIN) 100 MCG tablet Take 50 mcg by mouth daily.    . vitamin C (ASCORBIC ACID) 500 MG tablet Take 500 mg by mouth daily.     No current facility-administered medications for this visit.    Allergies  Allergen Reactions  . Haldol [Haloperidol Decanoate] Anaphylaxis  . Amoxicillin Hives    History   Social History  . Marital Status: Divorced    Spouse Name: N/A  . Number of Children: N/A  . Years of Education:  N/A   Occupational History  . Not on file.   Social History Main Topics  . Smoking status: Former Smoker -- 0.00 packs/day for 0 years    Quit date: 03/25/2012  . Smokeless tobacco: Never Used  . Alcohol Use: No  . Drug Use: Yes    Special: Marijuana     Comment: occ  . Sexual Activity: Not on file   Other Topics Concern  . Not on file   Social History Narrative     Review of Systems: General: negative for chills, fever, night sweats or weight changes.  Cardiovascular: negative for chest pain, dyspnea on exertion, edema, orthopnea, palpitations, paroxysmal nocturnal dyspnea or shortness of breath Dermatological: negative for  rash Respiratory: negative for cough or wheezing Urologic: negative for hematuria Abdominal: negative for nausea, vomiting, diarrhea, bright red blood per rectum, melena, or hematemesis Neurologic: negative for visual changes, syncope, or dizziness All other systems reviewed and are otherwise negative except as noted above.    Blood pressure 130/76, height 5\' 5"  (1.651 m), weight 191 lb 1.6 oz (86.682 kg), last menstrual period 11/16/2011.  General appearance: alert and no distress Neck: no adenopathy, no carotid bruit, no JVD, supple, symmetrical, trachea midline and thyroid not enlarged, symmetric, no tenderness/mass/nodules Lungs: clear to auscultation bilaterally Heart: regular rate and rhythm, S1, S2 normal, no murmur, click, rub or gallop Extremities: extremities normal, atraumatic, no cyanosis or edema  EKG normal sinus at 76 with incomplete right bundle branch block and inferior Q waves extending up the lateral leads. I presume reviewed this EKG  ASSESSMENT AND PLAN:   Hyperlipidemia LDL goal <70 History of hyperlipidemia on atorvastatin 80 mg a day. We will recheck a lipid and liver profile  Essential hypertension History of hypertension blood pressure measured at 130/76. She is on lisinopril 20 mg a day. She says her blood pressure at home is much lower than this and she feels somewhat lethargic. I'm going to cut her lisinopril in half  Coronary artery disease involving coronary bypass graft History of coronary artery disease status post bypass grafting in 2008 with a LIMA to LAD, vein graft to a ramus branch and to the RCA. She did have a stent in her distal RCA prior to this. I cathed her 03/04/10 revealing patent grafts and normal LV function. She denies chest pain but does complain of some shortness of breath which I suspect is related to increased weight.      Lorretta Harp MD FACP,FACC,FAHA, [facility] 03/17/2015 2:28 PM

## 2015-03-17 NOTE — Assessment & Plan Note (Signed)
History of hyperlipidemia on atorvastatin 80 mg a day. We will recheck a lipid and liver profile

## 2015-04-06 ENCOUNTER — Other Ambulatory Visit: Payer: Self-pay | Admitting: Cardiovascular Disease

## 2015-04-06 NOTE — Telephone Encounter (Signed)
Rx(s) sent to pharmacy electronically.  

## 2015-04-11 ENCOUNTER — Other Ambulatory Visit: Payer: Self-pay | Admitting: Cardiovascular Disease

## 2015-04-12 NOTE — Telephone Encounter (Signed)
Rx(s) sent to pharmacy electronically.  

## 2015-04-15 ENCOUNTER — Telehealth: Payer: Self-pay | Admitting: *Deleted

## 2015-04-15 NOTE — Telephone Encounter (Signed)
Requesting surgical clearance:   1. Type of surgery: right 2nd hammertoe correction and 2nd MT Weil osteotomy  2. Surgeon: Dr Wylene Simmer  3. Surgical date: TBA  4. Medications that need to be held: ASA  5. CAD: Yes-history of RCA stenting by Dr. Dr. Doylene Canard in the past. She had coronary artery bypass grafting November 2008 with LIMA to LAD, vein to ramus branch and PDA. Last cath was on 03/04/10 revealing patent grafts and normal LV function. Her most recent Myoview performed 04/18/11 with nonischemic.     6. I will defer to: Dr Quay Burow

## 2015-04-26 NOTE — Telephone Encounter (Signed)
This is low risk surgery. Cleared  From CV

## 2015-04-27 NOTE — Telephone Encounter (Signed)
Message routed to [facility] [phone]

## 2015-04-28 ENCOUNTER — Other Ambulatory Visit: Payer: Self-pay | Admitting: Orthopedic Surgery

## 2015-05-07 ENCOUNTER — Encounter: Payer: Self-pay | Admitting: Gastroenterology

## 2015-05-09 ENCOUNTER — Other Ambulatory Visit: Payer: Self-pay | Admitting: Cardiovascular Disease

## 2015-05-22 DIAGNOSIS — M2040 Other hammer toe(s) (acquired), unspecified foot: Secondary | ICD-10-CM

## 2015-05-22 HISTORY — DX: Other hammer toe(s) (acquired), unspecified foot: M20.40

## 2015-05-27 ENCOUNTER — Encounter (HOSPITAL_BASED_OUTPATIENT_CLINIC_OR_DEPARTMENT_OTHER): Payer: Self-pay | Admitting: *Deleted

## 2015-05-28 ENCOUNTER — Encounter (HOSPITAL_BASED_OUTPATIENT_CLINIC_OR_DEPARTMENT_OTHER): Payer: Self-pay | Admitting: *Deleted

## 2015-05-28 NOTE — Pre-Procedure Instructions (Addendum)
To come for BMET History discussed with Dr. Kalman Shan; pt. OK to come for surgery.

## 2015-06-03 ENCOUNTER — Ambulatory Visit (HOSPITAL_BASED_OUTPATIENT_CLINIC_OR_DEPARTMENT_OTHER): Payer: Medicare Other | Admitting: Anesthesiology

## 2015-06-03 ENCOUNTER — Ambulatory Visit (HOSPITAL_BASED_OUTPATIENT_CLINIC_OR_DEPARTMENT_OTHER)
Admission: RE | Admit: 2015-06-03 | Discharge: 2015-06-03 | Disposition: A | Discharge status: Home / Self Care | Payer: Medicare Other | Source: Ambulatory Visit | Attending: Orthopedic Surgery | Admitting: Orthopedic Surgery

## 2015-06-03 ENCOUNTER — Encounter (HOSPITAL_BASED_OUTPATIENT_CLINIC_OR_DEPARTMENT_OTHER): Payer: Self-pay | Admitting: Certified Registered"

## 2015-06-03 ENCOUNTER — Encounter (HOSPITAL_BASED_OUTPATIENT_CLINIC_OR_DEPARTMENT_OTHER)
Admission: RE | Disposition: A | Discharge status: Home / Self Care | Payer: Self-pay | Source: Ambulatory Visit | Attending: Orthopedic Surgery

## 2015-06-03 DIAGNOSIS — J449 Chronic obstructive pulmonary disease, unspecified: Secondary | ICD-10-CM | POA: Insufficient documentation

## 2015-06-03 DIAGNOSIS — M7741 Metatarsalgia, right foot: Secondary | ICD-10-CM | POA: Diagnosis not present

## 2015-06-03 DIAGNOSIS — I252 Old myocardial infarction: Secondary | ICD-10-CM | POA: Diagnosis not present

## 2015-06-03 DIAGNOSIS — Z7982 Long term (current) use of aspirin: Secondary | ICD-10-CM | POA: Insufficient documentation

## 2015-06-03 DIAGNOSIS — I2510 Atherosclerotic heart disease of native coronary artery without angina pectoris: Secondary | ICD-10-CM | POA: Diagnosis not present

## 2015-06-03 DIAGNOSIS — M2041 Other hammer toe(s) (acquired), right foot: Secondary | ICD-10-CM | POA: Insufficient documentation

## 2015-06-03 DIAGNOSIS — Z79899 Other long term (current) drug therapy: Secondary | ICD-10-CM | POA: Diagnosis not present

## 2015-06-03 DIAGNOSIS — I10 Essential (primary) hypertension: Secondary | ICD-10-CM | POA: Insufficient documentation

## 2015-06-03 DIAGNOSIS — Z87891 Personal history of nicotine dependence: Secondary | ICD-10-CM | POA: Insufficient documentation

## 2015-06-03 HISTORY — DX: Panic disorder (episodic paroxysmal anxiety): F41.0

## 2015-06-03 HISTORY — DX: Cyst of kidney, acquired: N28.1

## 2015-06-03 HISTORY — DX: Presence of dental prosthetic device (complete) (partial): Z97.2

## 2015-06-03 HISTORY — PX: HAMMERTOE RECONSTRUCTION WITH WEIL OSTEOTOMY: SHX5631

## 2015-06-03 HISTORY — DX: Shortness of breath: R06.02

## 2015-06-03 HISTORY — DX: Other hammer toe(s) (acquired), unspecified foot: M20.40

## 2015-06-03 HISTORY — DX: Family history of other specified conditions: Z84.89

## 2015-06-03 HISTORY — DX: Unspecified cataract: H26.9

## 2015-06-03 HISTORY — DX: Old myocardial infarction: I25.2

## 2015-06-03 LAB — POCT I-STAT, CHEM 8
BUN: 23 mg/dL — ABNORMAL HIGH (ref 6–20)
Calcium, Ion: 1.19 mmol/L (ref 1.12–1.23)
Chloride: 104 mmol/L (ref 101–111)
Creatinine, Ser: 1 mg/dL (ref 0.44–1.00)
Glucose, Bld: 123 mg/dL — ABNORMAL HIGH (ref 65–99)
HCT: 49 % — ABNORMAL HIGH (ref 36.0–46.0)
Hemoglobin: 16.7 g/dL — ABNORMAL HIGH (ref 12.0–15.0)
Potassium: 4.6 mmol/L (ref 3.5–5.1)
Sodium: 138 mmol/L (ref 135–145)
TCO2: 22 mmol/L (ref 0–100)

## 2015-06-03 SURGERY — HAMMERTOE RECONSTRUCTION WITH WEIL OSTEOTOMY
Anesthesia: General | Laterality: Right

## 2015-06-03 MED ORDER — FENTANYL CITRATE (PF) 100 MCG/2ML IJ SOLN
INTRAMUSCULAR | Status: AC
  Filled 2015-06-03: qty 2

## 2015-06-03 MED ORDER — MIDAZOLAM HCL 2 MG/2ML IJ SOLN
INTRAMUSCULAR | Status: AC
  Filled 2015-06-03: qty 2

## 2015-06-03 MED ORDER — BUPIVACAINE-EPINEPHRINE (PF) 0.5% -1:200000 IJ SOLN
INTRAMUSCULAR | Status: DC | PRN
  Administered 2015-06-03: 30 mL via PERINEURAL

## 2015-06-03 MED ORDER — PROMETHAZINE HCL 25 MG/ML IJ SOLN: 6.2500 mg | INTRAMUSCULAR | Status: DC | PRN

## 2015-06-03 MED ORDER — OXYCODONE HCL 5 MG PO TABS: 5.0000 mg | ORAL_TABLET | ORAL | Status: DC | PRN

## 2015-06-03 MED ORDER — DEXAMETHASONE SODIUM PHOSPHATE 10 MG/ML IJ SOLN
INTRAMUSCULAR | Status: DC | PRN
  Administered 2015-06-03: 10 mg via INTRAVENOUS

## 2015-06-03 MED ORDER — MIDAZOLAM HCL 2 MG/2ML IJ SOLN
1.0000 mg | INTRAMUSCULAR | Status: DC | PRN
  Administered 2015-06-03: 2 mg via INTRAVENOUS

## 2015-06-03 MED ORDER — FENTANYL CITRATE (PF) 100 MCG/2ML IJ SOLN
50.0000 ug | INTRAMUSCULAR | Status: DC | PRN
  Administered 2015-06-03: 100 ug via INTRAVENOUS

## 2015-06-03 MED ORDER — SCOPOLAMINE 1 MG/3DAYS TD PT72: 1.0000 | MEDICATED_PATCH | Freq: Once | TRANSDERMAL | Status: DC | PRN

## 2015-06-03 MED ORDER — LIDOCAINE HCL (CARDIAC) 20 MG/ML IV SOLN
INTRAVENOUS | Status: DC | PRN
  Administered 2015-06-03: 30 mg via INTRAVENOUS

## 2015-06-03 MED ORDER — SODIUM CHLORIDE 0.9 % IV SOLN
INTRAVENOUS | Status: DC
Start: 2015-06-03 — End: 2015-06-03

## 2015-06-03 MED ORDER — ONDANSETRON HCL 4 MG/2ML IJ SOLN
INTRAMUSCULAR | Status: DC | PRN
  Administered 2015-06-03: 4 mg via INTRAVENOUS

## 2015-06-03 MED ORDER — PROPOFOL 10 MG/ML IV BOLUS
INTRAVENOUS | Status: DC | PRN
  Administered 2015-06-03: 200 mg via INTRAVENOUS

## 2015-06-03 MED ORDER — PROPOFOL 500 MG/50ML IV EMUL
INTRAVENOUS | Status: AC
  Filled 2015-06-03: qty 50

## 2015-06-03 MED ORDER — LIDOCAINE HCL (CARDIAC) 20 MG/ML IV SOLN
INTRAVENOUS | Status: AC
  Filled 2015-06-03: qty 5

## 2015-06-03 MED ORDER — GLYCOPYRROLATE 0.2 MG/ML IJ SOLN: 0.2000 mg | Freq: Once | INTRAMUSCULAR | Status: DC | PRN

## 2015-06-03 MED ORDER — CITRIC ACID-SODIUM CITRATE 334-500 MG/5ML PO SOLN
ORAL | Status: AC
  Filled 2015-06-03: qty 15

## 2015-06-03 MED ORDER — LACTATED RINGERS IV SOLN
INTRAVENOUS | Status: DC
  Administered 2015-06-03 (×2): via INTRAVENOUS

## 2015-06-03 MED ORDER — LACTATED RINGERS IV SOLN: INTRAVENOUS | Status: DC

## 2015-06-03 MED ORDER — VANCOMYCIN HCL IN DEXTROSE 1-5 GM/200ML-% IV SOLN
1000.0000 mg | INTRAVENOUS | Status: AC
  Administered 2015-06-03: 1000 mg via INTRAVENOUS

## 2015-06-03 MED ORDER — CHLORHEXIDINE GLUCONATE 4 % EX LIQD: 60.0000 mL | Freq: Once | CUTANEOUS | Status: DC

## 2015-06-03 MED ORDER — VANCOMYCIN HCL IN DEXTROSE 1-5 GM/200ML-% IV SOLN
INTRAVENOUS | Status: AC
  Filled 2015-06-03: qty 200

## 2015-06-03 MED ORDER — DOCUSATE SODIUM 100 MG PO CAPS: 100.0000 mg | ORAL_CAPSULE | Freq: Two times a day (BID) | ORAL | Status: DC

## 2015-06-03 MED ORDER — SENNA 8.6 MG PO TABS: 2.0000 | ORAL_TABLET | Freq: Two times a day (BID) | ORAL | Status: DC

## 2015-06-03 MED ORDER — HYDROMORPHONE HCL 1 MG/ML IJ SOLN: 0.2500 mg | INTRAMUSCULAR | Status: DC | PRN

## 2015-06-03 MED ORDER — ONDANSETRON HCL 4 MG/2ML IJ SOLN
INTRAMUSCULAR | Status: AC
  Filled 2015-06-03: qty 2

## 2015-06-03 MED ORDER — DEXAMETHASONE SODIUM PHOSPHATE 10 MG/ML IJ SOLN
INTRAMUSCULAR | Status: AC
  Filled 2015-06-03: qty 1

## 2015-06-03 MED ORDER — MEPERIDINE HCL 25 MG/ML IJ SOLN: 6.2500 mg | INTRAMUSCULAR | Status: DC | PRN

## 2015-06-03 SURGICAL SUPPLY — 70 items
BANDAGE ESMARK 6X9 LF (GAUZE/BANDAGES/DRESSINGS) ×1 IMPLANT
BLADE AVERAGE 25X9 (BLADE) IMPLANT
BLADE LONG MED 25X9 (BLADE) ×2 IMPLANT
BLADE OSC/SAG .038X5.5 CUT EDG (BLADE) ×2 IMPLANT
BLADE SURG 15 STRL LF DISP TIS (BLADE) ×2 IMPLANT
BLADE SURG 15 STRL SS (BLADE) ×4
BNDG CMPR 9X4 STRL LF SNTH (GAUZE/BANDAGES/DRESSINGS) ×1
BNDG CMPR 9X6 STRL LF SNTH (GAUZE/BANDAGES/DRESSINGS) ×1
BNDG COHESIVE 4X5 TAN STRL (GAUZE/BANDAGES/DRESSINGS) ×2 IMPLANT
BNDG CONFORM 2 STRL LF (GAUZE/BANDAGES/DRESSINGS) IMPLANT
BNDG CONFORM 3 STRL LF (GAUZE/BANDAGES/DRESSINGS) ×2 IMPLANT
BNDG ESMARK 4X9 LF (GAUZE/BANDAGES/DRESSINGS) ×1 IMPLANT
BNDG ESMARK 6X9 LF (GAUZE/BANDAGES/DRESSINGS) ×2
CAP PIN PROTECTOR ORTHO WHT (CAP) IMPLANT
CHLORAPREP W/TINT 26ML (MISCELLANEOUS) ×2 IMPLANT
COVER BACK TABLE 60X90IN (DRAPES) ×2 IMPLANT
CUFF TOURNIQUET SINGLE 34IN LL (TOURNIQUET CUFF) ×1 IMPLANT
DRAPE EXTREMITY T 121X128X90 (DRAPE) ×2 IMPLANT
DRAPE OEC MINIVIEW 54X84 (DRAPES) ×2 IMPLANT
DRAPE U-SHAPE 47X51 STRL (DRAPES) ×2 IMPLANT
DRSG MEPITEL 4X7.2 (GAUZE/BANDAGES/DRESSINGS) ×2 IMPLANT
DRSG PAD ABDOMINAL 8X10 ST (GAUZE/BANDAGES/DRESSINGS) ×2 IMPLANT
ELECT REM PT RETURN 9FT ADLT (ELECTROSURGICAL) ×2
ELECTRODE REM PT RTRN 9FT ADLT (ELECTROSURGICAL) ×1 IMPLANT
GAUZE SPONGE 4X4 12PLY STRL (GAUZE/BANDAGES/DRESSINGS) ×2 IMPLANT
GLOVE BIO SURGEON STRL SZ7 (GLOVE) ×1 IMPLANT
GLOVE BIO SURGEON STRL SZ8 (GLOVE) ×2 IMPLANT
GLOVE BIOGEL PI IND STRL 7.0 (GLOVE) IMPLANT
GLOVE BIOGEL PI IND STRL 8 (GLOVE) ×2 IMPLANT
GLOVE BIOGEL PI INDICATOR 7.0 (GLOVE) ×2
GLOVE BIOGEL PI INDICATOR 8 (GLOVE) ×2
GLOVE ECLIPSE 6.5 STRL STRAW (GLOVE) ×1 IMPLANT
GLOVE ECLIPSE 7.5 STRL STRAW (GLOVE) ×2 IMPLANT
GLOVE EXAM NITRILE EXT CUFF MD (GLOVE) ×1 IMPLANT
GLOVE EXAM NITRILE MD LF STRL (GLOVE) IMPLANT
GOWN STRL REUS W/ TWL LRG LVL3 (GOWN DISPOSABLE) ×1 IMPLANT
GOWN STRL REUS W/ TWL XL LVL3 (GOWN DISPOSABLE) ×2 IMPLANT
GOWN STRL REUS W/TWL LRG LVL3 (GOWN DISPOSABLE) ×2
GOWN STRL REUS W/TWL XL LVL3 (GOWN DISPOSABLE) ×4
GUIDEWIRE ORTHO 062 (WIRE) ×1 IMPLANT
K-WIRE .054X4 (WIRE) IMPLANT
NEEDLE HYPO 22GX1.5 SAFETY (NEEDLE) IMPLANT
NS IRRIG 1000ML POUR BTL (IV SOLUTION) ×2 IMPLANT
PACK BASIN DAY SURGERY FS (CUSTOM PROCEDURE TRAY) ×2 IMPLANT
PAD CAST 4YDX4 CTTN HI CHSV (CAST SUPPLIES) ×1 IMPLANT
PADDING CAST ABS 4INX4YD NS (CAST SUPPLIES)
PADDING CAST ABS COTTON 4X4 ST (CAST SUPPLIES) IMPLANT
PADDING CAST COTTON 4X4 STRL (CAST SUPPLIES) ×2
PASSER SUT SWANSON 36MM LOOP (INSTRUMENTS) IMPLANT
PENCIL BUTTON HOLSTER BLD 10FT (ELECTRODE) ×2 IMPLANT
SANITIZER HAND PURELL 535ML FO (MISCELLANEOUS) ×2 IMPLANT
SCREW FUSION ACUTRAK 30 3000 (Screw) IMPLANT
SCREW FUSION ACUTRAK 30MM 3000 (Screw) ×2 IMPLANT
SCREW QUICK SNAP 2.0X14MM (Screw) ×1 IMPLANT
SHEET MEDIUM DRAPE 40X70 STRL (DRAPES) ×2 IMPLANT
SLEEVE SCD COMPRESS KNEE MED (MISCELLANEOUS) ×2 IMPLANT
SPONGE LAP 18X18 X RAY DECT (DISPOSABLE) ×2 IMPLANT
STOCKINETTE 6  STRL (DRAPES) ×1
STOCKINETTE 6 STRL (DRAPES) ×1 IMPLANT
SUCTION FRAZIER TIP 10 FR DISP (SUCTIONS) ×2 IMPLANT
SUT ETHILON 3 0 PS 1 (SUTURE) ×2 IMPLANT
SUT MNCRL AB 3-0 PS2 18 (SUTURE) ×2 IMPLANT
SUT VIC AB 2-0 SH 27 (SUTURE)
SUT VIC AB 2-0 SH 27XBRD (SUTURE) IMPLANT
SYR BULB 3OZ (MISCELLANEOUS) ×2 IMPLANT
SYR CONTROL 10ML LL (SYRINGE) IMPLANT
TOWEL OR 17X24 6PK STRL BLUE (TOWEL DISPOSABLE) ×2 IMPLANT
TUBE CONNECTING 20X1/4 (TUBING) ×2 IMPLANT
UNDERPAD 30X30 (UNDERPADS AND DIAPERS) ×2 IMPLANT
YANKAUER SUCT BULB TIP NO VENT (SUCTIONS) IMPLANT

## 2015-06-03 NOTE — Anesthesia Procedure Notes (Addendum)
Anesthesia Regional Block:  Popliteal block  Pre-Anesthetic Checklist: ,, timeout performed, Correct Patient, Correct Site, Correct Laterality, Correct Procedure, Correct Position, site marked, Risks and benefits discussed,  Surgical consent,  Pre-op evaluation,  At surgeon's request and post-op pain management  Laterality: Right  Prep: chloraprep       Needles:  Injection technique: Single-shot  Needle Type: Stimiplex     Needle Length: 5cm 5 cm Needle Gauge: 22 and 22 G    Additional Needles:  Procedures: ultrasound guided (picture in chart) Popliteal block  Nerve Stimulator or Paresthesia:  Response: biceps flexion,   Additional Responses:   Narrative:  Start time: 06/03/2015 10:56 AM End time: 06/03/2015 11:02 AM Injection made incrementally with aspirations every 5 mL.  Performed by: Personally  Anesthesiologist: Suella Broad D  Additional Notes: Functioning IV was confirmed and monitors were applied.  A 75mm 22ga Stimuplex needle was used. Sterile prep and drape,hand hygiene and sterile gloves were used.  Negative aspiration and negative test dose prior to incremental administration of local anesthetic. The patient tolerated the procedure well.      Procedure Name: LMA Insertion Date/Time: 06/03/2015 11:56 AM Performed by: Mayco Walrond D Pre-anesthesia Checklist: Patient identified, Emergency Drugs available, Suction available and Patient being monitored Patient Re-evaluated:Patient Re-evaluated prior to inductionOxygen Delivery Method: Circle System Utilized Preoxygenation: Pre-oxygenation with 100% oxygen Intubation Type: IV induction Ventilation: Mask ventilation without difficulty LMA: LMA inserted LMA Size: 4.0 Number of attempts: 1 Airway Equipment and Method: Bite block Placement Confirmation: positive ETCO2 Tube secured with: Tape Dental Injury: Teeth and Oropharynx as per pre-operative assessment

## 2015-06-03 NOTE — H&P (Signed)
[name] is an 54 y.o. female.   Chief Complaint: right foot hammertoe HPI: 54 y/o female with painful right 2nd hammertoe deformity.  She presents now for hammertoe correction and weil osteotomy.  Past Medical History  Diagnosis Date  . High cholesterol   . COPD (chronic obstructive pulmonary disease) ([place])   . Bipolar 1 disorder ([place])   . Urinary, incontinence, stress female   . Glaucoma   . Schizophrenia ([place])   . CHF (congestive heart failure) ([place])   . Coronary artery disease     triple vessel  . Panic attacks   . Anemia     takes iron supplement  . Arthritis     knees, shoulders  . GERD (gastroesophageal reflux disease)     occasional - no current med.  . Renal cyst, left   . Peripheral vascular disease ([place])     carotid  . Sleep apnea     no CPAP use  . Short of breath on exertion   . History of MI (myocardial infarction)   . Hypertension     states under control with meds., has been on med. x 8 yr.  Garner Nash 05/2015    right second toe  . Cataract, immature   . Wears partial dentures     lower  . Wears dentures     upper  . Family history of adverse reaction to anesthesia     states father "did not do well" with anesthesia; died last time he had surgery; also had heart disease    Past Surgical History  Procedure Laterality Date  . Orif foot fracture Left   . C-sections x 2  1985, 1988  . 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 Total knee arthroplasty Right 10/21/2012    Procedure: TOTAL KNEE ARTHROPLASTY;  Surgeon: Mauri Pole, MD;  Location: [facility];  Service: Orthopedics;  Laterality: Right;  . Cardiac catheterization  12/10/2006; 12/18/2006; 07/10/2007; 07/19/2009  . Cardiac catheterization  03/04/2010    patent grafts, normal LV function, diffusely diseased RCA (Dr. Adora Fridge)  . Knee arthroscopy with anterior cruciate ligament (acl) repair Right 01/30/2008  . Coronary artery bypass graft  07/16/2007    LIMA to LAD, VG to ramus, VG  to PDA   . Novasure ablation  12/19/2007    with removal of IUD  . Flexible bronchoscopy  07/16/2007  . Club foot release Bilateral     1st and 2nd toes  . Hand surgery Bilateral     1st and 2nd fingers - release of clubbing    Family History  Problem Relation Age of Onset  . Heart disease Father   . Anesthesia problems Father     states "did not do well" with anesthesia   Social History:  reports that she quit smoking about 1 weeks ago. She has never used smokeless tobacco. She reports that she does not drink alcohol or use illicit drugs.  Allergies:  Allergies  Allergen Reactions  . Codeine Other (See Comments)    "makes me psychotic"   . Haldol [Haloperidol Decanoate] Anaphylaxis  . Amoxicillin Hives  . Benadryl [Diphenhydramine Hcl]     Medications Prior to Admission  Medication Sig Dispense Refill  . albuterol (PROVENTIL HFA;VENTOLIN HFA) 108 (90 BASE) MCG/ACT inhaler Inhale 2 puffs into the lungs every 6 (six) hours as needed for wheezing.    Marland Kitchen aspirin 81 MG chewable tablet Chew 81 mg by mouth daily.    Marland Kitchen  atorvastatin (LIPITOR) 80 MG tablet Take 1 tablet (80 mg total) by mouth at bedtime. 90 tablet 3  . brimonidine (ALPHAGAN) 0.15 % ophthalmic solution Place 1 drop into both eyes 2 (two) times daily. 2 drops left eye BID; 1 drop right eye BID    . buPROPion (WELLBUTRIN XL) 150 MG 24 hr tablet Take 150 mg by mouth daily.    . ferrous sulfate 325 (65 FE) MG tablet Take 325 mg by mouth daily with breakfast.    . gabapentin (NEURONTIN) 300 MG capsule Take 300 mg by mouth at bedtime.    . isosorbide mononitrate (IMDUR) 30 MG 24 hr tablet TAKE 1 TABLET (30 MG TOTAL) BY MOUTH DAILY. 90 tablet 3  . lamoTRIgine (LAMICTAL) 100 MG tablet Take 200 mg by mouth daily.     Marland Kitchen lisinopril (PRINIVIL,ZESTRIL) 10 MG tablet Take 1 tablet (10 mg total) by mouth daily. 90 tablet 3  . metoprolol tartrate (LOPRESSOR) 25 MG tablet Take 0.5 tablets (12.5 mg total) by mouth 2 (two) times daily. 90  tablet 3  . Multiple Vitamin (MULTIVITAMIN) capsule Take 1 capsule by mouth daily.    . niacin (NIASPAN) 1000 MG CR tablet TAKE 1 TABLET (1,000 MG TOTAL) BY MOUTH AT BEDTIME. 90 tablet 3  . omega-3 acid ethyl esters (LOVAZA) 1 G capsule TAKE ONE CAPSULE BY MOUTH TWICE A DAY 60 capsule 7  . oxybutynin (DITROPAN-XL) 10 MG 24 hr tablet Take 30 mg by mouth daily. TAKES IN AM    . QUEtiapine (SEROQUEL) 25 MG tablet Take 125 mg by mouth at bedtime.     . nitroGLYCERIN (NITROSTAT) 0.4 MG SL tablet Place 1 tablet (0.4 mg total) under the tongue every 5 (five) minutes as needed. 25 tablet 3    Results for orders placed or performed during the hospital encounter of 06-24-15 (from the past 48 hour(s))  I-STAT, chem 8     Status: Abnormal   Collection Time: June 24, 2015 10:41 AM  Result Value Ref Range   Sodium 138 135 - 145 mmol/L   Potassium 4.6 3.5 - 5.1 mmol/L   Chloride 104 101 - 111 mmol/L   BUN 23 (H) 6 - 20 mg/dL   Creatinine, Ser 1.00 0.44 - 1.00 mg/dL   Glucose, Bld 123 (H) 65 - 99 mg/dL   Calcium, Ion 1.19 1.12 - 1.23 mmol/L   TCO2 22 0 - 100 mmol/L   Hemoglobin 16.7 (H) 12.0 - 15.0 g/dL   HCT 49.0 (H) 36.0 - 46.0 %   No results found.  ROS  No recent f/c/n/v/w tloss  Blood pressure 130/72, pulse 47, temperature 97.8 F (36.6 C), temperature source Oral, resp. rate 16, height 5\' 5"  (1.651 m), weight 86.694 kg (191 lb 2 oz), last menstrual period 11/16/2011, SpO2 98 %. Physical Exam  wn wd womain in nad.  A and O x 4.  Mood and affect normal.  EOMI.  resp unlabored.  R foot with 2nd hammertoe deformity.  Skin o/w healthy and intact.  No lymphadenopathy.  5/5 strength in PF and DF of the foot.  Assessment/Plan R 2nd hammertoe deformity and metatarsalgia - to OR for hammertoe correction and weil osteotomy.  The risks and benefits of the alternative treatment options have been discussed in detail.  The patient wishes to proceed with surgery and specifically understands risks of bleeding,  infection, nerve damage, blood clots, need for additional surgery, amputation and death.   Wylene Simmer 24-Jun-2015, 11:31 AM

## 2015-06-03 NOTE — Anesthesia Postprocedure Evaluation (Addendum)
  Anesthesia Post-op Note  Patient: [name]  Procedure(s) Performed: Procedure(s): RIGHT SECOND HAMMERTOE CORRECTION WITH MT WEIL OSTEOTOMY (Right)  Patient Location: PACU  Anesthesia Type:GA combined with regional for post-op pain  Level of Consciousness: awake, alert  and oriented  Airway and Oxygen Therapy: Patient connected to nasal cannula oxygen  Post-op Pain: none  Post-op Assessment: Post-op Vital signs reviewed and Patient's Cardiovascular Status Stable     RLE Motor Response: Purposeful movement RLE Sensation: Decreased      Post-op Vital Signs: Reviewed and stable  Last Vitals:  Filed Vitals:   06/03/15 1300  BP: 127/79  Pulse: 42  Temp:   Resp: 12    Complications: No apparent anesthesia complications and Pt complaining of substernal, abdominal pain which she states is always present at home. She denies CP. Repeat EKG was unchanged from preop EKG, no ST changes. Vital Signs stable. She will follow up with PCP ASAP regarding abdominal sx's and has scheduled heart cath in 2 months.

## 2015-06-03 NOTE — Anesthesia Preprocedure Evaluation (Addendum)
Anesthesia Evaluation  Patient identified by MRN, date of birth, ID band Patient awake    Reviewed: Allergy & Precautions, NPO status , Patient's Chart, lab work & pertinent test results, reviewed documented beta blocker date and time   Airway Mallampati: II  TM Distance: >3 FB Neck ROM: Full    Dental  (+) Teeth Intact   Pulmonary sleep apnea , COPD, former smoker,    breath sounds clear to auscultation       Cardiovascular hypertension, Pt. on medications and Pt. on home beta blockers + CAD, + CABG and +CHF   Rhythm:Regular Rate:Normal     Neuro/Psych PSYCHIATRIC DISORDERS Anxiety Bipolar Disorder Schizophrenia    GI/Hepatic GERD  ,  Endo/Other    Renal/GU Renal InsufficiencyRenal disease  negative genitourinary   Musculoskeletal  (+) Arthritis , Osteoarthritis,    Abdominal   Peds negative pediatric ROS (+)  Hematology   Anesthesia Other Findings   Reproductive/Obstetrics                           Lab Results  Component Value Date   WBC 8.2 06/24/2014   HGB 13.9 06/24/2014   HCT 39.9 06/24/2014   MCV 99.3 06/24/2014   PLT 228 06/24/2014   Lab Results  Component Value Date   CREATININE 1.10 06/24/2014   BUN 21 06/24/2014   NA 140 06/24/2014   K 3.8 06/24/2014   CL 105 06/24/2014   CO2 25 06/24/2014   Lab Results  Component Value Date   INR 0.94 10/16/2012   INR 1.14 07/16/2009   INR 0.9 01/27/2008   EKG: normal sinus rhythm, LVH.  Cardiac Cath: patent graft, no formal study present.   Anesthesia Physical Anesthesia Plan  ASA: III  Anesthesia Plan: General   Post-op Pain Management: GA combined w/ Regional for post-op pain   Induction: Intravenous  Airway Management Planned: LMA  Additional Equipment:   Intra-op Plan:   Post-operative Plan: Extubation in OR  Informed Consent: I have reviewed the patients History and Physical, chart, labs and discussed the  procedure including the risks, benefits and alternatives for the proposed anesthesia with the patient or authorized representative who has indicated his/her understanding and acceptance.   Dental advisory given  Plan Discussed with: CRNA  Anesthesia Plan Comments:         Anesthesia Quick Evaluation

## 2015-06-03 NOTE — Transfer of Care (Signed)
Immediate Anesthesia Transfer of Care Note  Patient: [name]  Procedure(s) Performed: Procedure(s): RIGHT SECOND HAMMERTOE CORRECTION WITH MT WEIL OSTEOTOMY (Right)  Patient Location: PACU  Anesthesia Type:GA combined with regional for post-op pain  Level of Consciousness: awake, alert , oriented and patient cooperative  Airway & Oxygen Therapy: Patient Spontanous Breathing and Patient connected to face mask oxygen  Post-op Assessment: Report given to RN and Post -op Vital signs reviewed and stable  Post vital signs: Reviewed and stable  Last Vitals:  Filed Vitals:   06/03/15 1236  BP:   Pulse: 48  Temp:   Resp: 9    Complications: No apparent anesthesia complications

## 2015-06-03 NOTE — Brief Op Note (Signed)
06/03/2015  12:41 PM  PATIENT:  [name]  54 y.o. female  PRE-OPERATIVE DIAGNOSIS:  right second hammertoe and metatarsalgia  POST-OPERATIVE DIAGNOSIS:  same  Procedure(s): 1.  RIGHT SECOND HAMMERTOE CORREction 2.  Right 2nd MT WEIL OSTEOTOMY 3.  Right 2nd MTPJ dorsal capsulotomy and extensor tendon lengthening. 4.  AP and lateral xrays of the right foot  SURGEON:  Wylene Simmer, MD  ASSISTANT: Mechele Claude, PA-C  ANESTHESIA:   General, regional  EBL:  minimal   TOURNIQUET:   Total Tourniquet Time Documented: Thigh (Right) - 20 minutes Total: Thigh (Right) - 20 minutes  COMPLICATIONS:  None apparent  DISPOSITION:  Extubated, awake and stable to recovery.  DICTATION ID:  [id number]

## 2015-06-03 NOTE — Discharge Instructions (Addendum)
Wylene Simmer, MD [place]  Please read the following information regarding your care after surgery.  Medications  You only need a prescription for the narcotic pain medicine (ex. oxycodone, Percocet, Norco).  All of the other medicines listed below are available over the counter. X acetominophen (Tylenol) 650 mg every 4-6 hours as you need for minor pain X oxycodone as prescribed for moderate to severe pain ?   Narcotic pain medicine (ex. oxycodone, Percocet, Vicodin) will cause constipation.  To prevent this problem, take the following medicines while you are taking any pain medicine. X docusate sodium (Colace) 100 mg twice a day X senna (Senokot) 2 tablets twice a day  X To help prevent blood clots, take an aspirin (325 mg) once a day for a month after surgery.  You should also get up every hour while you are awake to move around.    Weight Bearing X Bear weight when you are able on your operated leg or foot in flat post-op shoe.   Cast / Splint / Dressing X Remove your dressing 3 days after surgery and cover the incisions with dry dressings.    After your dressing, cast or splint is removed; you may shower, but do not soak or scrub the wound.  Allow the water to run over it, and then gently pat it dry.  Swelling It is normal for you to have swelling where you had surgery.  To reduce swelling and pain, keep your toes above your nose for at least 3 days after surgery.  It may be necessary to keep your foot or leg elevated for several weeks.  If it hurts, it should be elevated.  Follow Up Call my office at [phone] when you are discharged from the hospital or surgery center to schedule an appointment to be seen two weeks after surgery.  Call my office at [phone] if you develop a fever >101.5 F, nausea, vomiting, bleeding from the surgical site or severe pain.    Regional Anesthesia Blocks  1. Numbness or the inability to move the "blocked" extremity may last  from 3-48 hours after placement. The length of time depends on the medication injected and your individual response to the medication. If the numbness is not going away after 48 hours, call your surgeon.  2. The extremity that is blocked will need to be protected until the numbness is gone and the  Strength has returned. Because you cannot feel it, you will need to take extra care to avoid injury. Because it may be weak, you may have difficulty moving it or using it. You may not know what position it is in without looking at it while the block is in effect.  3. For blocks in the legs and feet, returning to weight bearing and walking needs to be done carefully. You will need to wait until the numbness is entirely gone and the strength has returned. You should be able to move your leg and foot normally before you try and bear weight or walk. You will need someone to be with you when you first try to ensure you do not fall and possibly risk injury.  4. Bruising and tenderness at the needle site are common side effects and will resolve in a few days.  5. Persistent numbness or new problems with movement should be communicated to the surgeon or the [facility] [phone] [place] [phone]).  Post Anesthesia Home Care Instructions  Activity: Get plenty of rest for  the remainder of the day. A responsible adult should stay with you for 24 hours following the procedure.  For the next 24 hours, DO NOT: -Drive a car -Paediatric nurse -Drink alcoholic beverages -Take any medication unless instructed by your physician -Make any legal decisions or sign important papers.  Meals: Start with liquid foods such as gelatin or soup. Progress to regular foods as tolerated. Avoid greasy, spicy, heavy foods. If nausea and/or vomiting occur, drink only clear liquids until the nausea and/or vomiting subsides. Call your physician if vomiting continues.  Special  Instructions/Symptoms: Your throat may feel dry or sore from the anesthesia or the breathing tube placed in your throat during surgery. If this causes discomfort, gargle with warm salt water. The discomfort should disappear within 24 hours.  If you had a scopolamine patch placed behind your ear for the management of post- operative nausea and/or vomiting:  1. The medication in the patch is effective for 72 hours, after which it should be removed.  Wrap patch in a tissue and discard in the trash. Wash hands thoroughly with soap and water. 2. You may remove the patch earlier than 72 hours if you experience unpleasant side effects which may include dry mouth, dizziness or visual disturbances. 3. Avoid touching the patch. Wash your hands with soap and water after contact with the patch.

## 2015-06-03 NOTE — Progress Notes (Signed)
Assisted Dr.  Robert with right, ultrasound guided, popliteal/saphenous block. Side rails up, monitors on throughout procedure. See vital signs in flow sheet. Tolerated Procedure well.

## 2015-06-04 ENCOUNTER — Encounter (HOSPITAL_BASED_OUTPATIENT_CLINIC_OR_DEPARTMENT_OTHER): Payer: Self-pay | Admitting: Orthopedic Surgery

## 2015-06-04 NOTE — Op Note (Signed)
[name], [name]             ACCOUNT NO.:  [id number]  MEDICAL RECORD NO.:  [id number]  LOCATION:                                 FACILITY:  PHYSICIAN:  Wylene Simmer, MD             DATE OF BIRTH:  DATE OF PROCEDURE:  06/03/2015 DATE OF DISCHARGE:                              OPERATIVE REPORT   PREOPERATIVE DIAGNOSES: 1. Right second hammertoe deformity. 2. Right second metatarsalgia.  POSTOPERATIVE DIAGNOSES: 1. Right second hammertoe deformity. 2. Right second metatarsalgia.  PROCEDURE: 1. Right second hammertoe correction. 2. Right second metatarsal Weil osteotomy through a separate incision. 3. Right second metatarsophalangeal joint dorsal capsulotomy and     extensor tendon lengthening. 4. AP and lateral radiographs of the right foot.  SURGEON:  Wylene Simmer, MD.  ASSISTANT:  Mechele Claude, PA-C.  ANESTHESIA:  General, regional.  ESTIMATED BLOOD LOSS:  Minimal.  TOURNIQUET TIME:  20 minutes at 250 mmHg.  COMPLICATIONS:  None apparent.  DISPOSITION:  Extubated, awake, and stable to recovery.  INDICATIONS FOR PROCEDURE:  The patient is a 54 year old woman who has a long history of painful right second hammertoe deformity.  She has failed nonoperative treatment to date.  She presents now for surgical correction.  She understands the risks and benefits, the alternative treatment options, and elects surgical treatment.  She specifically understands risks of bleeding, infection, nerve damage, blood clots, need for additional surgery, continued pain, recurrence of the deformity, amputation, and death.  PROCEDURE IN DETAIL:  After preoperative consent was obtained and the correct operative site was identified, the patient was brought to the operating room and placed supine on the operating table.  General anesthesia was induced.  Preoperative antibiotics were administered. Surgical time-out was taken.  The right lower extremity was prepped and draped in standard  sterile fashion with a tourniquet around the thigh. The extremity was exsanguinated and the tourniquet was inflated to 250 mmHg.  A longitudinal incision was then made over the hallux MP joint. Sharp dissection was carried down through skin and subcutaneous tissue. The extensor tendons were lengthened in Z fashion.  The dorsal joint capsule was excised in its entirety.  The metatarsal head was then exposed.  A Weil osteotomy was made with an oscillating saw removing a small wedge of bone distally.  The metatarsal head was allowed to retract several mm proximally.  The osteotomy was then fixed with a partially-threaded Acumed Twist-Off screw.  The overhanging bone was trimmed with a rongeur.  Attention was then turned to the proximal phalanx where transverse incision was made over the PIP joint.  Sharp dissection was carried down through skin and subcutaneous tissue and extensor mechanism.  The collateral ligaments were released.  The head of the proximal phalanx was exposed.  The oscillating saw was used to remove the head of the proximal phalanx followed by the base of the middle phalanx.  The joint was irrigated copiously.  Stab incision was made at the tip of the toe. An Acumed Acutrak screw was inserted across the PIP joint and was noted to have excellent purchase.  AP and lateral radiographs showed appropriate position of the screws and  appropriate position and length of all hardware.  Both wounds were irrigated copiously.  The extensor tendons were repaired with 3-0 Monocryl sutures, the subcutaneous tissues were approximated with Monocryl, and skin incisions were closed with 3-0 nylon sutures.  Sterile dressings were applied followed by compression wrap.  Tourniquet was released at 20 minutes.  The patient was awakened from anesthesia and transported to the recovery room in stable condition.  FOLLOWUP PLAN:  The patient will be weightbearing as tolerated on her right foot in a  flat postop shoe.  She will follow up with me in 2 weeks for suture removal.  RADIOGRAPHS:  AP and lateral radiographs of the right foot were obtained intraoperatively.  These show interval correction of second hammertoe deformity as well as shortening of the second metatarsal.  Hardware was appropriately positioned and of the appropriate length.  No other acute injuries are noted.  Mechele Claude, PA-C was present and scrubbed for the duration of the case.  His assistance was essential in positioning the patient, prepping and draping, gaining and maintaining exposure, performing the operation, closing and dressing the wounds, and applying the splint.     Wylene Simmer, MD     JH/MEDQ  D:  06/03/2015  T:  06/04/2015  Job:  [id number]

## 2015-06-10 ENCOUNTER — Other Ambulatory Visit: Payer: Self-pay | Admitting: Cardiovascular Disease

## 2015-06-10 NOTE — Telephone Encounter (Signed)
Rx request sent to pharmacy.  

## 2015-07-06 ENCOUNTER — Encounter: Payer: Self-pay | Admitting: Primary Care

## 2015-07-07 ENCOUNTER — Encounter: Payer: Medicare Other | Admitting: Gastroenterology

## 2015-09-02 ENCOUNTER — Other Ambulatory Visit: Payer: Self-pay | Admitting: Cardiovascular Disease

## 2015-09-02 NOTE — Telephone Encounter (Signed)
Rx request sent to pharmacy.  

## 2015-09-24 DIAGNOSIS — J449 Chronic obstructive pulmonary disease, unspecified: Secondary | ICD-10-CM | POA: Insufficient documentation

## 2015-09-24 DIAGNOSIS — H409 Unspecified glaucoma: Secondary | ICD-10-CM | POA: Insufficient documentation

## 2015-09-24 DIAGNOSIS — F319 Bipolar disorder, unspecified: Secondary | ICD-10-CM | POA: Insufficient documentation

## 2015-09-24 DIAGNOSIS — N3946 Mixed incontinence: Secondary | ICD-10-CM | POA: Insufficient documentation

## 2015-09-24 DIAGNOSIS — N281 Cyst of kidney, acquired: Secondary | ICD-10-CM | POA: Insufficient documentation

## 2015-09-24 DIAGNOSIS — M25561 Pain in right knee: Secondary | ICD-10-CM | POA: Insufficient documentation

## 2015-09-27 ENCOUNTER — Encounter: Payer: Self-pay | Admitting: Internal Medicine

## 2015-10-21 ENCOUNTER — Telehealth: Payer: Self-pay

## 2015-10-21 NOTE — Telephone Encounter (Signed)
Left message to call back Drug Therapy Authorization form received. Need update fasting lipid/liver on pt to evaluate medication changes

## 2015-10-28 ENCOUNTER — Encounter: Payer: Medicare Other | Admitting: Internal Medicine

## 2015-11-16 ENCOUNTER — Ambulatory Visit (AMBULATORY_SURGERY_CENTER): Payer: Self-pay

## 2015-11-16 VITALS — Ht 64.0 in | Wt 193.4 lb

## 2015-11-16 DIAGNOSIS — Z83719 Family history of colon polyps, unspecified: Secondary | ICD-10-CM

## 2015-11-16 DIAGNOSIS — Z8371 Family history of colonic polyps: Secondary | ICD-10-CM

## 2015-11-16 MED ORDER — SUPREP BOWEL PREP KIT 17.5-3.13-1.6 GM/177ML PO SOLN: 1.0000 | Freq: Once | ORAL | Status: DC

## 2015-11-16 NOTE — Progress Notes (Signed)
No past problems with anesthesia HOWEVER has family hx of adverse reaction-father No allergies to eggs or soy No diet/weight loss meds No home oxygen  Has email and internet; refused emmi

## 2015-11-25 ENCOUNTER — Telehealth: Payer: Self-pay | Admitting: Internal Medicine

## 2015-11-25 ENCOUNTER — Encounter: Payer: Medicare Other | Admitting: Internal Medicine

## 2015-11-25 NOTE — Telephone Encounter (Signed)
Called patient to try and Reschedule Procedure. No answer. Left Voice Mail to call back.

## 2015-11-25 NOTE — Telephone Encounter (Signed)
Staff to facilitate rescheduling, no charge if reschedules

## 2015-11-28 ENCOUNTER — Other Ambulatory Visit: Payer: Self-pay | Admitting: Cardiovascular Disease

## 2015-11-29 NOTE — Telephone Encounter (Signed)
REFILL 

## 2016-02-09 ENCOUNTER — Telehealth: Payer: Self-pay | Admitting: Cardiovascular Disease

## 2016-02-09 NOTE — Telephone Encounter (Signed)
Received call from patient.She stated she would like to see Dr.Berry.Stated she had severe acid reflux earlier today lasting appox 4 to 5 hours.Stated she has been at her mother's home today helping her she has cancer.Stated when she got back home she had heaviness in chest.She laid down and when she got up she started having heaviness in left arm radiating up into left shoulder and left neck.She took NTG x 1 with relief.Advised office is closed now.Advised I will call her back tomorrow with appointment.Advised if she starts having chest pain tonight she needs to go to ER.

## 2016-02-09 NOTE — Telephone Encounter (Signed)
Pt c/o of Chest Pain: STAT if CP now or developed within 24 hours  1. Are you having CP right now? No   2. Are you experiencing any other symptoms (ex. SOB, nausea, vomiting, sweating)? nausea  3. How long have you been experiencing CP? Today 6/21  4. Is your CP continuous or coming and going? Coming and going   5. Have you taken Nitroglycerin? Yes ?

## 2016-02-10 ENCOUNTER — Ambulatory Visit: Payer: Medicare Other | Admitting: Cardiology

## 2016-02-10 NOTE — Telephone Encounter (Signed)
Returned call to patient.Appointment scheduled with Truitt Merle NP 02/11/16 at 8:30 am at [facility] office.Advised if she has any more chest pain go to ER.

## 2016-02-11 ENCOUNTER — Encounter: Payer: Self-pay | Admitting: Nurse Practitioner

## 2016-02-11 ENCOUNTER — Telehealth: Payer: Self-pay | Admitting: Cardiovascular Disease

## 2016-02-11 ENCOUNTER — Ambulatory Visit
Admission: RE | Admit: 2016-02-11 | Discharge: 2016-02-11 | Disposition: A | Discharge status: Home / Self Care | Payer: Medicare Other | Source: Ambulatory Visit | Attending: Nurse Practitioner | Admitting: Nurse Practitioner

## 2016-02-11 ENCOUNTER — Ambulatory Visit (INDEPENDENT_AMBULATORY_CARE_PROVIDER_SITE_OTHER): Payer: Medicare Other | Admitting: Nurse Practitioner

## 2016-02-11 ENCOUNTER — Other Ambulatory Visit: Payer: Self-pay | Admitting: Nurse Practitioner

## 2016-02-11 VITALS — BP 124/76 | HR 51 | Ht 65.0 in | Wt 199.0 lb

## 2016-02-11 DIAGNOSIS — I10 Essential (primary) hypertension: Secondary | ICD-10-CM

## 2016-02-11 DIAGNOSIS — I259 Chronic ischemic heart disease, unspecified: Secondary | ICD-10-CM

## 2016-02-11 DIAGNOSIS — I209 Angina pectoris, unspecified: Secondary | ICD-10-CM

## 2016-02-11 DIAGNOSIS — Z720 Tobacco use: Secondary | ICD-10-CM

## 2016-02-11 DIAGNOSIS — E785 Hyperlipidemia, unspecified: Secondary | ICD-10-CM | POA: Diagnosis not present

## 2016-02-11 LAB — HEPATIC FUNCTION PANEL
ALT: 17 U/L (ref 6–29)
AST: 18 U/L (ref 10–35)
Albumin: 3.9 g/dL (ref 3.6–5.1)
Alkaline Phosphatase: 107 U/L (ref 33–130)
Bilirubin, Direct: 0 mg/dL (ref <=0.2)
Indirect Bilirubin: 0.3 mg/dL (ref 0.2–1.2)
Total Bilirubin: 0.3 mg/dL (ref 0.2–1.2)
Total Protein: 6.4 g/dL (ref 6.1–8.1)

## 2016-02-11 LAB — BASIC METABOLIC PANEL
BUN: 23 mg/dL (ref 7–25)
CO2: 25 mmol/L (ref 20–31)
Calcium: 9.2 mg/dL (ref 8.6–10.4)
Chloride: 106 mmol/L (ref 98–110)
Creat: 1.1 mg/dL — ABNORMAL HIGH (ref 0.50–1.05)
Glucose, Bld: 117 mg/dL — ABNORMAL HIGH (ref 65–99)
Potassium: 4.4 mmol/L (ref 3.5–5.3)
Sodium: 139 mmol/L (ref 135–146)

## 2016-02-11 LAB — PROTIME-INR
INR: 1
Prothrombin Time: 11.1 s (ref 9.0–11.5)

## 2016-02-11 LAB — CBC
HCT: 39.3 % (ref 35.0–45.0)
Hemoglobin: 13.3 g/dL (ref 11.7–15.5)
MCH: 33.5 pg — ABNORMAL HIGH (ref 27.0–33.0)
MCHC: 33.8 g/dL (ref 32.0–36.0)
MCV: 99 fL (ref 80.0–100.0)
MPV: 11.2 fL (ref 7.5–12.5)
Platelets: 223 10*3/uL (ref 140–400)
RBC: 3.97 MIL/uL (ref 3.80–5.10)
RDW: 14.1 % (ref 11.0–15.0)
WBC: 11.2 10*3/uL — ABNORMAL HIGH (ref 3.8–10.8)

## 2016-02-11 LAB — TROPONIN I: Troponin I: <0.01 ng/mL (ref <=0.05)

## 2016-02-11 LAB — APTT: aPTT: 27 s (ref 22–34)

## 2016-02-11 MED ORDER — ISOSORBIDE MONONITRATE ER 30 MG PO TB24: ORAL_TABLET | ORAL | Status: DC

## 2016-02-11 NOTE — Telephone Encounter (Signed)
New message   Pt states she is returning call for rn who called her- she dont know the name

## 2016-02-11 NOTE — Addendum Note (Signed)
Addended by: Burtis Junes on: 02/11/2016 09:14 AM   Modules accepted: Orders

## 2016-02-11 NOTE — Patient Instructions (Addendum)
We will be checking the following labs today - BMET, CBC, PT, PTT and stat Troponin   Please go to [place] to [place] on the first floor for a chest Xray - you may walk in.    Medication Instructions:    Continue with your current medicines. BUT  I am increasing the Isosorbide to 2 pills a day - 60 mg a day    Testing/Procedures To Be Arranged: Cardiac Catheterization   Your provider has recommended a cardiac catherization  You are scheduled for a cardiac catheterization on Tuesday, June 27th at 7:30AM with Dr. Martinique or associate.  Go to [facility] 2nd Floor Short Stay on Tuesday, June 27th at 5:30 Am.  Enter thru the [employer] entrance A No food or drink after midnight on Monday. You may take your medications with a sip of water on the day of your procedure.   Coronary Angiogram A coronary angiogram, also called coronary angiography, is an X-ray procedure used to look at the arteries in the heart. In this procedure, a dye (contrast dye) is injected through a long, hollow tube (catheter). The catheter is about the size of a piece of cooked spaghetti and is inserted through your groin, wrist, or arm. The dye is injected into each artery, and X-rays are then taken to show if there is a blockage in the arteries of your heart.  LET [facility] CARE PROVIDER KNOW ABOUT:  Any allergies you have, including allergies to shellfish or contrast dye.   All medicines you are taking, including vitamins, herbs, eye drops, creams, and over-the-counter medicines.   Previous problems you or members of your family have had with the use of anesthetics.   Any blood disorders you have.   Previous surgeries you have had.  History of kidney problems or failure.   Other medical conditions you have.  RISKS AND COMPLICATIONS  Generally, a coronary angiogram is a safe procedure. However, about 1 person out of 1000 can have problems that may include:  Allergic  reaction to the dye.  Bleeding/bruising from the access site or other locations.  Kidney injury, especially in people with impaired kidney function.  Stroke (rare).  Heart attack (rare).  Irregular rhythms (rare)  Death (rare)  BEFORE THE PROCEDURE   Do not eat or drink anything after midnight the night before the procedure or as directed by your health care provider.   Ask your health care provider about changing or stopping your regular medicines. This is especially important if you are taking diabetes medicines or blood thinners.  PROCEDURE  You may be given a medicine to help you relax (sedative) before the procedure. This medicine is given through an intravenous (IV) access tube that is inserted into one of your veins.   The area where the catheter will be inserted will be washed and shaved. This is usually done in the groin but may be done in the fold of your arm (near your elbow) or in the wrist.   A medicine will be given to numb the area where the catheter will be inserted (local anesthetic).   The health care provider will insert the catheter into an artery. The catheter will be guided by using a special type of X-ray (fluoroscopy) of the blood vessel being examined.   A special dye will then be injected into the catheter, and X-rays will be taken. The dye will help to show where any narrowing or blockages are located in the heart arteries.  AFTER THE PROCEDURE   If the procedure is done through the leg, you will be kept in bed lying flat for several hours. You will be instructed to not bend or cross your legs.  The insertion site will be checked frequently.   The pulse in your feet or wrist will be checked frequently.   Additional blood tests, X-rays, and an electrocardiogram may be done.       Other Special Instructions:   Try to stop smoking    If you need a refill on your cardiac medications before your next appointment, please call your  pharmacy.   Call the [place] office at [phone] if you have any questions, problems or concerns.

## 2016-02-11 NOTE — Progress Notes (Signed)
CARDIOLOGY OFFICE NOTE  Date:  02/11/2016    [name] Date of Birth: 09-18-1960 Medical Record #[id number]  PCP:  Suzanna Obey, MD  Cardiologist:  Gwenlyn Found    Chief Complaint  Patient presents with  . Chest Pain  . Coronary Artery Disease  . Hypertension  . Hyperlipidemia    Work in visit - seen for Dr. Gwenlyn Found    History of Present Illness: [name] is a 55 y.o. female who presents today for a work in visit. Seen for Dr. Gwenlyn Found.   She has known CAD.  She has had RCA stenting by Dr. Dr. Doylene Canard in the past. She had coronary artery bypass grafting November 2008 with LIMA to LAD, SVG to ramus branch and PDA. She is a former smoker. Other issues include hypertension, hyperlipidemia and obstructive sleep apnea intolerant to CPAP. She was cathed 03/04/10 revealing patent grafts and normal LV function. Her most recent Myoview performed 04/18/11 with nonischemic.   She was last seen a year ago. Felt to be doing ok.   Phone call earlier this week - "Received call from patient.She stated she would like to see Dr.Berry.Stated she had severe acid reflux earlier today lasting appox 4 to 5 hours.Stated she has been at her mother's home today helping her she has cancer.Stated when she got back home she had heaviness in chest.She laid down and when she got up she started having heaviness in left arm radiating up into left shoulder and left neck.She took NTG x 1 with relief.Advised office is closed now.Advised I will call her back tomorrow with appointment.Advised if she starts having chest pain tonight she needs to go to ER."  Thus added to my schedule.  Comes back today. Here alone. She notes that she has done ok up until the past few weeks. Now with profound fatigue. Does not know why. Had a prolonged episode of chest pain - left chest/left shoulder/left arm and left jaw. Took NTG x 1 with prompt relief - recurred 30 minutes later - took another NTG - got relief - then took a  sleeping pill and went to bed. Remains tired. No more chest pain. This spell occurred at rest. She is worried. She is back smoking. No regular exercise. Admits she is typically very "manic" as her baseline. Taking her medicines. Some shortness of breath.   Past Medical History  Diagnosis Date  . High cholesterol   . COPD (chronic obstructive pulmonary disease) ([place])   . Bipolar 1 disorder ([place])   . Urinary, incontinence, stress female   . Glaucoma   . Schizophrenia ([place])   . CHF (congestive heart failure) ([place])   . Coronary artery disease     triple vessel  . Panic attacks   . Anemia     takes iron supplement  . Arthritis     knees, shoulders  . GERD (gastroesophageal reflux disease)     occasional - no current med.  . Renal cyst, left   . Peripheral vascular disease ([place])     carotid  . Sleep apnea     no CPAP use  . Short of breath on exertion   . History of MI (myocardial infarction)   . Hypertension     states under control with meds., has been on med. x 8 yr.  Garner Nash 05/2015    right second toe  . Cataract, immature   . Wears partial dentures     lower  . Wears dentures  upper  . Family history of adverse reaction to anesthesia     states father "did not do well" with anesthesia; died last time he had surgery; also had heart disease    Past Surgical History  Procedure Laterality Date  . Orif foot fracture Left   . C-sections x 2  1985, 1988  . 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 Total knee arthroplasty Right 10/21/2012    Procedure: TOTAL KNEE ARTHROPLASTY;  Surgeon: Mauri Pole, MD;  Location: [facility];  Service: Orthopedics;  Laterality: Right;  . Cardiac catheterization  12/10/2006; 12/18/2006; 07/10/2007; 07/19/2009  . Cardiac catheterization  03/04/2010    patent grafts, normal LV function, diffusely diseased RCA (Dr. Adora Fridge)  . Knee arthroscopy with anterior cruciate ligament (acl) repair Right 01/30/2008  . Coronary artery bypass  graft  07/16/2007    LIMA to LAD, VG to ramus, VG to PDA   . Novasure ablation  12/19/2007    with removal of IUD  . Flexible bronchoscopy  07/16/2007  . Club foot release Bilateral     1st and 2nd toes  . Hand surgery Bilateral     1st and 2nd fingers - release of clubbing  . Hammertoe reconstruction with weil osteotomy Right 06/03/2015    Procedure: RIGHT SECOND HAMMERTOE CORRECTION WITH MT WEIL OSTEOTOMY;  Surgeon: Wylene Simmer, MD;  Location: [facility];  Service: Orthopedics;  Laterality: Right;     Medications: Current Outpatient Prescriptions  Medication Sig Dispense Refill  . albuterol (PROVENTIL HFA;VENTOLIN HFA) 108 (90 BASE) MCG/ACT inhaler Inhale 2 puffs into the lungs every 6 (six) hours as needed for wheezing.    Marland Kitchen aspirin 81 MG chewable tablet Chew 81 mg by mouth daily.    Marland Kitchen atorvastatin (LIPITOR) 80 MG tablet Take 1 tablet (80 mg total) by mouth at bedtime. 90 tablet 3  . brimonidine (ALPHAGAN) 0.15 % ophthalmic solution Place 1 drop into both eyes 2 (two) times daily. 2 drops left eye BID; 1 drop right eye BID    . buPROPion (WELLBUTRIN XL) 150 MG 24 hr tablet Take 150 mg by mouth daily.    Marland Kitchen docusate sodium (COLACE) 100 MG capsule Take 1 capsule (100 mg total) by mouth 2 (two) times daily. While taking narcotic pain medicine. 30 capsule 0  . ferrous sulfate 325 (65 FE) MG tablet Take 325 mg by mouth daily with breakfast.    . Fluticasone-Salmeterol (ADVAIR) 250-50 MCG/DOSE AEPB Inhale 1 puff into the lungs 2 (two) times daily.    Marland Kitchen gabapentin (NEURONTIN) 300 MG capsule Take 300 mg by mouth at bedtime.    . isosorbide mononitrate (IMDUR) 30 MG 24 hr tablet TAKE 1 TABLET (30 MG TOTAL) BY MOUTH DAILY. 90 tablet 2  . lamoTRIgine (LAMICTAL) 100 MG tablet Take 200 mg by mouth daily.     Marland Kitchen lisinopril (PRINIVIL,ZESTRIL) 10 MG tablet Take 1 tablet (10 mg total) by mouth daily. 90 tablet 3  . metoprolol tartrate (LOPRESSOR) 25 MG tablet Take 0.5 tablets (12.5 mg  total) by mouth 2 (two) times daily. 90 tablet 3  . Multiple Vitamin (MULTIVITAMIN) capsule Take 1 capsule by mouth daily.    . niacin (NIASPAN) 1000 MG CR tablet TAKE 1 TABLET (1,000 MG TOTAL) BY MOUTH AT BEDTIME. 90 tablet 3  . nitroGLYCERIN (NITROSTAT) 0.4 MG SL tablet Place 1 tablet (0.4 mg total) under the tongue every 5 (five) minutes as needed. 25 tablet 3  . omega-3  acid ethyl esters (LOVAZA) 1 g capsule TAKE ONE CAPSULE BY MOUTH TWICE A DAY 60 capsule 2  . oxybutynin (DITROPAN-XL) 10 MG 24 hr tablet Take 30 mg by mouth daily. TAKES IN AM    . QUEtiapine (SEROQUEL) 25 MG tablet Take 125 mg by mouth at bedtime.     . senna (SENOKOT) 8.6 MG TABS tablet Take 2 tablets (17.2 mg total) by mouth 2 (two) times daily. 30 each 0  . SUPREP BOWEL PREP SOLN Take 1 kit by mouth once. 354 mL 0   No current facility-administered medications for this visit.    Allergies: Allergies  Allergen Reactions  . Codeine Other (See Comments)    "makes me psychotic"   . Haldol [Haloperidol Decanoate] Anaphylaxis  . Amoxicillin Hives  . Bee Venom   . Benadryl [Diphenhydramine Hcl]     Social History: The patient  reports that she quit smoking about 8 months ago. She has never used smokeless tobacco. She reports that she does not drink alcohol or use illicit drugs.   Family History: The patient's family history includes Anesthesia problems in her father; Colon cancer in her sister; Heart disease in her father.   Review of Systems: Please see the history of present illness.   Otherwise, the review of systems is positive for none.   All other systems are reviewed and negative.   Physical Exam: VS:  BP 124/76 mmHg  Pulse 51  Ht _0  (1.651 m)  Wt 199 lb (90.266 kg)  BMI 33.12 kg/m2  LMP 11/16/2011 .  BMI Body mass index is 33.12 kg/(m^2).  Wt Readings from Last 3 Encounters:  02/11/16 199 lb (90.266 kg)  11/16/15 193 lb 6.4 oz (87.726 kg)  06/03/15 191 lb 2 oz (86.694 kg)    General:  Pleasant. She is alert. She looks older than her stated age. She is in no acute distress.  HEENT: Normal. Neck: Supple, no JVD, carotid bruits, or masses noted.  Cardiac: Regular rate and rhythm. Soft outflow murmur. No edema.  Respiratory:  Lungs are coarse with normal work of breathing.  GI: Soft and nontender.  MS: No deformity or atrophy. Gait and ROM intact. Skin: Warm and dry. Color is normal.  Neuro:  Strength and sensation are intact and no gross focal deficits noted.  Psych: Alert, appropriate and with normal affect.   LABORATORY DATA:  EKG:  EKG is ordered today. This demonstrates sinus bradycardia with 1st degree AV block - prior inferior infarct, diffuse T wave changes.  Lab Results  Component Value Date   WBC 8.2 06/24/2014   HGB 16.7* 06/03/2015   HCT 49.0* 06/03/2015   PLT 228 06/24/2014   GLUCOSE 123* 06/03/2015   CHOL  07/17/2009    193        ATP III CLASSIFICATION:  <200     mg/dL   Desirable  200-239  mg/dL   Borderline High  >=240    mg/dL   High          TRIG 318* 07/17/2009   HDL 34* 07/17/2009   LDLCALC  07/17/2009    95        Total Cholesterol/HDL:CHD Risk Coronary Heart Disease Risk Table                     Men   Women  1/2 Average Risk   3.4   3.3  Average Risk       5.0   4.4  2  X Average Risk   9.6   7.1  3 X Average Risk  23.4   11.0        Use the calculated Patient Ratio above and the CHD Risk Table to determine the patient's CHD Risk.        ATP III CLASSIFICATION (LDL):  <100     mg/dL   Optimal  100-129  mg/dL   Near or Above                    Optimal  130-159  mg/dL   Borderline  160-189  mg/dL   High  >190     mg/dL   Very High   ALT 19 06/24/2014   AST 18 06/24/2014   NA 138 06/03/2015   K 4.6 06/03/2015   CL 104 06/03/2015   CREATININE 1.00 06/03/2015   BUN 23* 06/03/2015   CO2 25 06/24/2014   TSH 1.792 Test methodology is 3rd generation TSH 07/17/2009   INR 0.94 10/16/2012   HGBA1C  07/17/2009    6.0 (NOTE)  The ADA recommends the following therapeutic goal for glycemic control related to Hgb A1c measurement: Goal of therapy: <6.5 Hgb A1c  Reference: American Diabetes Association: Clinical Practice Recommendations 2010, Diabetes Care, 2010, 33: (Suppl  1).    BNP (last 3 results) No results for input(s): BNP in the last 8760 hours.  ProBNP (last 3 results) No results for input(s): PROBNP in the last 8760 hours.   Other Studies Reviewed Today:   Assessment/Plan: 1. CAD - with prior CABG per PVT and prior PCI - multiple risk factors - continues to smoke - having recurrent angina - cardiac cath is recommended. Your provider has recommended a cardiac catheterization. The patient understands that risks include but are not limited to stroke (1 in 1000), death (1 in 79), kidney failure [usually temporary] (1 in 500), bleeding (1 in 200), allergic reaction [possibly serious] (1 in 200), and agrees to proceed.  Will check stat Troponin as well - if this is abnormal - she will need to go on to the ER today. Cardiac cath arranged for next Tuesday.   2. HTN - ok on current regimen.   3. HLD - on statin  4. Tobacco abuse - does not seem ready to stop.   Current medicines are reviewed with the patient today.  The patient does not have concerns regarding medicines other than what has been noted above.  The following changes have been made:  See above.  Labs/ tests ordered today include:    Orders Placed This Encounter  Procedures  . DG Chest 2 View  . Basic metabolic panel  . CBC  . Hepatic function panel  . Protime-INR  . APTT  . Troponin I  . EKG 12-Lead     Disposition:   Further disposition to follow.    Patient is agreeable to this plan and will call if any problems develop in the interim.   Signed: Burtis Junes, RN, ANP-C 02/11/2016 9:08 AM  [facility] [address] [place], [place]  [address] Phone: [phone] Fax: [phone]

## 2016-02-11 NOTE — Telephone Encounter (Signed)
Returned called to patient. Notified her of pre-cath labs & CXR results.

## 2016-02-15 ENCOUNTER — Encounter (HOSPITAL_COMMUNITY)
Admission: RE | Disposition: A | Discharge status: Home / Self Care | Payer: Self-pay | Source: Ambulatory Visit | Attending: Cardiology

## 2016-02-15 ENCOUNTER — Ambulatory Visit (HOSPITAL_COMMUNITY)
Admission: RE | Admit: 2016-02-15 | Discharge: 2016-02-15 | Disposition: A | Discharge status: Home / Self Care | Payer: Medicare Other | Source: Ambulatory Visit | Attending: Cardiology | Admitting: Cardiology

## 2016-02-15 ENCOUNTER — Encounter (HOSPITAL_COMMUNITY): Payer: Self-pay | Admitting: Cardiology

## 2016-02-15 DIAGNOSIS — I2510 Atherosclerotic heart disease of native coronary artery without angina pectoris: Secondary | ICD-10-CM

## 2016-02-15 DIAGNOSIS — J449 Chronic obstructive pulmonary disease, unspecified: Secondary | ICD-10-CM | POA: Insufficient documentation

## 2016-02-15 DIAGNOSIS — I440 Atrioventricular block, first degree: Secondary | ICD-10-CM | POA: Insufficient documentation

## 2016-02-15 DIAGNOSIS — F319 Bipolar disorder, unspecified: Secondary | ICD-10-CM | POA: Insufficient documentation

## 2016-02-15 DIAGNOSIS — Z951 Presence of aortocoronary bypass graft: Secondary | ICD-10-CM | POA: Insufficient documentation

## 2016-02-15 DIAGNOSIS — I2582 Chronic total occlusion of coronary artery: Secondary | ICD-10-CM | POA: Insufficient documentation

## 2016-02-15 DIAGNOSIS — Z87891 Personal history of nicotine dependence: Secondary | ICD-10-CM | POA: Diagnosis not present

## 2016-02-15 DIAGNOSIS — Z7982 Long term (current) use of aspirin: Secondary | ICD-10-CM | POA: Diagnosis not present

## 2016-02-15 DIAGNOSIS — Z955 Presence of coronary angioplasty implant and graft: Secondary | ICD-10-CM | POA: Diagnosis not present

## 2016-02-15 DIAGNOSIS — E785 Hyperlipidemia, unspecified: Secondary | ICD-10-CM | POA: Diagnosis not present

## 2016-02-15 DIAGNOSIS — I509 Heart failure, unspecified: Secondary | ICD-10-CM | POA: Diagnosis not present

## 2016-02-15 DIAGNOSIS — E7800 Pure hypercholesterolemia, unspecified: Secondary | ICD-10-CM | POA: Insufficient documentation

## 2016-02-15 DIAGNOSIS — I739 Peripheral vascular disease, unspecified: Secondary | ICD-10-CM | POA: Diagnosis not present

## 2016-02-15 DIAGNOSIS — F209 Schizophrenia, unspecified: Secondary | ICD-10-CM | POA: Diagnosis not present

## 2016-02-15 DIAGNOSIS — K219 Gastro-esophageal reflux disease without esophagitis: Secondary | ICD-10-CM | POA: Diagnosis not present

## 2016-02-15 DIAGNOSIS — D649 Anemia, unspecified: Secondary | ICD-10-CM | POA: Diagnosis not present

## 2016-02-15 DIAGNOSIS — Z880 Allergy status to penicillin: Secondary | ICD-10-CM | POA: Diagnosis not present

## 2016-02-15 DIAGNOSIS — M1990 Unspecified osteoarthritis, unspecified site: Secondary | ICD-10-CM | POA: Diagnosis not present

## 2016-02-15 DIAGNOSIS — I110 Hypertensive heart disease with heart failure: Secondary | ICD-10-CM | POA: Diagnosis not present

## 2016-02-15 DIAGNOSIS — R072 Precordial pain: Secondary | ICD-10-CM | POA: Insufficient documentation

## 2016-02-15 DIAGNOSIS — I252 Old myocardial infarction: Secondary | ICD-10-CM | POA: Diagnosis not present

## 2016-02-15 DIAGNOSIS — I25118 Atherosclerotic heart disease of native coronary artery with other forms of angina pectoris: Secondary | ICD-10-CM | POA: Diagnosis present

## 2016-02-15 DIAGNOSIS — Z8249 Family history of ischemic heart disease and other diseases of the circulatory system: Secondary | ICD-10-CM | POA: Diagnosis not present

## 2016-02-15 DIAGNOSIS — H409 Unspecified glaucoma: Secondary | ICD-10-CM | POA: Diagnosis not present

## 2016-02-15 DIAGNOSIS — I200 Unstable angina: Secondary | ICD-10-CM | POA: Diagnosis present

## 2016-02-15 DIAGNOSIS — G4733 Obstructive sleep apnea (adult) (pediatric): Secondary | ICD-10-CM | POA: Insufficient documentation

## 2016-02-15 DIAGNOSIS — I10 Essential (primary) hypertension: Secondary | ICD-10-CM | POA: Diagnosis present

## 2016-02-15 DIAGNOSIS — I25810 Atherosclerosis of coronary artery bypass graft(s) without angina pectoris: Secondary | ICD-10-CM | POA: Diagnosis present

## 2016-02-15 HISTORY — PX: CARDIAC CATHETERIZATION: SHX172

## 2016-02-15 SURGERY — LEFT HEART CATH AND CORS/GRAFTS ANGIOGRAPHY
Anesthesia: LOCAL

## 2016-02-15 MED ORDER — HEPARIN SODIUM (PORCINE) 1000 UNIT/ML IJ SOLN
INTRAMUSCULAR | Status: AC
  Filled 2016-02-15: qty 1

## 2016-02-15 MED ORDER — IOPAMIDOL (ISOVUE-370) INJECTION 76%
INTRAVENOUS | Status: AC
  Filled 2016-02-15: qty 125

## 2016-02-15 MED ORDER — SODIUM CHLORIDE 0.9% FLUSH: 3.0000 mL | Freq: Two times a day (BID) | INTRAVENOUS | Status: DC

## 2016-02-15 MED ORDER — LIDOCAINE HCL (PF) 1 % IJ SOLN
INTRAMUSCULAR | Status: DC | PRN
  Administered 2016-02-15: 2 mL via INTRADERMAL

## 2016-02-15 MED ORDER — FENTANYL CITRATE (PF) 100 MCG/2ML IJ SOLN
INTRAMUSCULAR | Status: AC
  Filled 2016-02-15: qty 2

## 2016-02-15 MED ORDER — ASPIRIN 81 MG PO CHEW
CHEWABLE_TABLET | ORAL | Status: AC
Start: 2016-02-15 — End: 2016-02-15
  Administered 2016-02-15: 81 mg
  Filled 2016-02-15: qty 1

## 2016-02-15 MED ORDER — FENTANYL CITRATE (PF) 100 MCG/2ML IJ SOLN
INTRAMUSCULAR | Status: DC | PRN
  Administered 2016-02-15 (×2): 25 ug via INTRAVENOUS

## 2016-02-15 MED ORDER — SODIUM CHLORIDE 0.9 % IV SOLN: 250.0000 mL | INTRAVENOUS | Status: DC | PRN

## 2016-02-15 MED ORDER — HEPARIN (PORCINE) IN NACL 2-0.9 UNIT/ML-% IJ SOLN
INTRAMUSCULAR | Status: AC
  Filled 2016-02-15: qty 1000

## 2016-02-15 MED ORDER — VERAPAMIL HCL 2.5 MG/ML IV SOLN
INTRAVENOUS | Status: AC
  Filled 2016-02-15: qty 2

## 2016-02-15 MED ORDER — HEPARIN (PORCINE) IN NACL 2-0.9 UNIT/ML-% IJ SOLN
INTRAMUSCULAR | Status: DC | PRN
  Administered 2016-02-15: 1500 mL

## 2016-02-15 MED ORDER — MIDAZOLAM HCL 2 MG/2ML IJ SOLN
INTRAMUSCULAR | Status: AC
  Filled 2016-02-15: qty 2

## 2016-02-15 MED ORDER — VERAPAMIL HCL 2.5 MG/ML IV SOLN
INTRAVENOUS | Status: DC | PRN
  Administered 2016-02-15: 10 mL via INTRA_ARTERIAL

## 2016-02-15 MED ORDER — SODIUM CHLORIDE 0.9 % IV SOLN
INTRAVENOUS | Status: DC
  Administered 2016-02-15: 07:00:00 via INTRAVENOUS

## 2016-02-15 MED ORDER — DIAZEPAM 5 MG PO TABS
ORAL_TABLET | ORAL | Status: AC
  Administered 2016-02-15: 10 mg
  Filled 2016-02-15: qty 2

## 2016-02-15 MED ORDER — ASPIRIN 81 MG PO CHEW: 81.0000 mg | CHEWABLE_TABLET | ORAL | Status: DC

## 2016-02-15 MED ORDER — SODIUM CHLORIDE 0.9 % WEIGHT BASED INFUSION: 1.0000 mL/kg/h | INTRAVENOUS | Status: DC

## 2016-02-15 MED ORDER — IOPAMIDOL (ISOVUE-370) INJECTION 76%
INTRAVENOUS | Status: DC | PRN
  Administered 2016-02-15: 110 mL via INTRA_ARTERIAL

## 2016-02-15 MED ORDER — MIDAZOLAM HCL 2 MG/2ML IJ SOLN
INTRAMUSCULAR | Status: DC | PRN
  Administered 2016-02-15: 2 mg via INTRAVENOUS
  Administered 2016-02-15: 1 mg via INTRAVENOUS

## 2016-02-15 MED ORDER — IOPAMIDOL (ISOVUE-370) INJECTION 76%
INTRAVENOUS | Status: AC
  Filled 2016-02-15: qty 50

## 2016-02-15 MED ORDER — LIDOCAINE HCL (PF) 1 % IJ SOLN
INTRAMUSCULAR | Status: AC
Start: 2016-02-15 — End: 2016-02-15
  Filled 2016-02-15: qty 30

## 2016-02-15 MED ORDER — HEPARIN SODIUM (PORCINE) 1000 UNIT/ML IJ SOLN
INTRAMUSCULAR | Status: DC | PRN
  Administered 2016-02-15: 4500 [IU] via INTRAVENOUS

## 2016-02-15 MED ORDER — ALBUTEROL SULFATE HFA 108 (90 BASE) MCG/ACT IN AERS
2.0000 | INHALATION_SPRAY | Freq: Four times a day (QID) | RESPIRATORY_TRACT | Status: DC | PRN
  Administered 2016-02-15: 2 via RESPIRATORY_TRACT
  Filled 2016-02-15: qty 6.7

## 2016-02-15 MED ORDER — SODIUM CHLORIDE 0.9% FLUSH: 3.0000 mL | INTRAVENOUS | Status: DC | PRN

## 2016-02-15 MED ORDER — DIAZEPAM 5 MG PO TABS: 10.0000 mg | ORAL_TABLET | ORAL | Status: DC

## 2016-02-15 SURGICAL SUPPLY — 13 items
CATH INFINITI 5 FR IM (CATHETERS) ×1 IMPLANT
CATH INFINITI 5FR MULTPACK ANG (CATHETERS) ×1 IMPLANT
COVER PRB 48X5XTLSCP FOLD TPE (BAG) IMPLANT
COVER PROBE 5X48 (BAG) ×2
DEVICE RAD COMP TR BAND LRG (VASCULAR PRODUCTS) ×1 IMPLANT
GLIDESHEATH SLEND SS 6F .021 (SHEATH) ×1 IMPLANT
KIT HEART LEFT (KITS) ×2 IMPLANT
PACK CARDIAC CATHETERIZATION (CUSTOM PROCEDURE TRAY) ×2 IMPLANT
SYR MEDRAD MARK V 150ML (SYRINGE) ×2 IMPLANT
TRANSDUCER W/STOPCOCK (MISCELLANEOUS) ×2 IMPLANT
TUBING CIL FLEX 10 FLL-RA (TUBING) ×2 IMPLANT
WIRE HI TORQ VERSACORE-J 145CM (WIRE) ×1 IMPLANT
WIRE SAFE-T 1.5MM-J .035X260CM (WIRE) ×1 IMPLANT

## 2016-02-15 NOTE — H&P (View-Only) (Signed)
CARDIOLOGY OFFICE NOTE  Date:  02/11/2016    [name] Date of Birth: 09-18-1960 Medical Record #[id number]  PCP:  Suzanna Obey, MD  Cardiologist:  Gwenlyn Found    Chief Complaint  Patient presents with  . Chest Pain  . Coronary Artery Disease  . Hypertension  . Hyperlipidemia    Work in visit - seen for Dr. Gwenlyn Found    History of Present Illness: [name] is a 55 y.o. female who presents today for a work in visit. Seen for Dr. Gwenlyn Found.   She has known CAD.  She has had RCA stenting by Dr. Dr. Doylene Canard in the past. She had coronary artery bypass grafting November 2008 with LIMA to LAD, SVG to ramus branch and PDA. She is a former smoker. Other issues include hypertension, hyperlipidemia and obstructive sleep apnea intolerant to CPAP. She was cathed 03/04/10 revealing patent grafts and normal LV function. Her most recent Myoview performed 04/18/11 with nonischemic.   She was last seen a year ago. Felt to be doing ok.   Phone call earlier this week - "Received call from patient.She stated she would like to see Dr.Berry.Stated she had severe acid reflux earlier today lasting appox 4 to 5 hours.Stated she has been at her mother's home today helping her she has cancer.Stated when she got back home she had heaviness in chest.She laid down and when she got up she started having heaviness in left arm radiating up into left shoulder and left neck.She took NTG x 1 with relief.Advised office is closed now.Advised I will call her back tomorrow with appointment.Advised if she starts having chest pain tonight she needs to go to ER."  Thus added to my schedule.  Comes back today. Here alone. She notes that she has done ok up until the past few weeks. Now with profound fatigue. Does not know why. Had a prolonged episode of chest pain - left chest/left shoulder/left arm and left jaw. Took NTG x 1 with prompt relief - recurred 30 minutes later - took another NTG - got relief - then took a  sleeping pill and went to bed. Remains tired. No more chest pain. This spell occurred at rest. She is worried. She is back smoking. No regular exercise. Admits she is typically very "manic" as her baseline. Taking her medicines. Some shortness of breath.   Past Medical History  Diagnosis Date  . High cholesterol   . COPD (chronic obstructive pulmonary disease) ([place])   . Bipolar 1 disorder ([place])   . Urinary, incontinence, stress female   . Glaucoma   . Schizophrenia ([place])   . CHF (congestive heart failure) ([place])   . Coronary artery disease     triple vessel  . Panic attacks   . Anemia     takes iron supplement  . Arthritis     knees, shoulders  . GERD (gastroesophageal reflux disease)     occasional - no current med.  . Renal cyst, left   . Peripheral vascular disease ([place])     carotid  . Sleep apnea     no CPAP use  . Short of breath on exertion   . History of MI (myocardial infarction)   . Hypertension     states under control with meds., has been on med. x 8 yr.  Garner Nash 05/2015    right second toe  . Cataract, immature   . Wears partial dentures     lower  . Wears dentures  upper  . Family history of adverse reaction to anesthesia     states father "did not do well" with anesthesia; died last time he had surgery; also had heart disease    Past Surgical History  Procedure Laterality Date  . Orif foot fracture Left   . C-sections x 2  1985, 1988  . 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 Total knee arthroplasty Right 10/21/2012    Procedure: TOTAL KNEE ARTHROPLASTY;  Surgeon: Mauri Pole, MD;  Location: [facility];  Service: Orthopedics;  Laterality: Right;  . Cardiac catheterization  12/10/2006; 12/18/2006; 07/10/2007; 07/19/2009  . Cardiac catheterization  03/04/2010    patent grafts, normal LV function, diffusely diseased RCA (Dr. Adora Fridge)  . Knee arthroscopy with anterior cruciate ligament (acl) repair Right 01/30/2008  . Coronary artery bypass  graft  07/16/2007    LIMA to LAD, VG to ramus, VG to PDA   . Novasure ablation  12/19/2007    with removal of IUD  . Flexible bronchoscopy  07/16/2007  . Club foot release Bilateral     1st and 2nd toes  . Hand surgery Bilateral     1st and 2nd fingers - release of clubbing  . Hammertoe reconstruction with weil osteotomy Right 06/03/2015    Procedure: RIGHT SECOND HAMMERTOE CORRECTION WITH MT WEIL OSTEOTOMY;  Surgeon: Wylene Simmer, MD;  Location: [facility];  Service: Orthopedics;  Laterality: Right;     Medications: Current Outpatient Prescriptions  Medication Sig Dispense Refill  . albuterol (PROVENTIL HFA;VENTOLIN HFA) 108 (90 BASE) MCG/ACT inhaler Inhale 2 puffs into the lungs every 6 (six) hours as needed for wheezing.    Marland Kitchen aspirin 81 MG chewable tablet Chew 81 mg by mouth daily.    Marland Kitchen atorvastatin (LIPITOR) 80 MG tablet Take 1 tablet (80 mg total) by mouth at bedtime. 90 tablet 3  . brimonidine (ALPHAGAN) 0.15 % ophthalmic solution Place 1 drop into both eyes 2 (two) times daily. 2 drops left eye BID; 1 drop right eye BID    . buPROPion (WELLBUTRIN XL) 150 MG 24 hr tablet Take 150 mg by mouth daily.    Marland Kitchen docusate sodium (COLACE) 100 MG capsule Take 1 capsule (100 mg total) by mouth 2 (two) times daily. While taking narcotic pain medicine. 30 capsule 0  . ferrous sulfate 325 (65 FE) MG tablet Take 325 mg by mouth daily with breakfast.    . Fluticasone-Salmeterol (ADVAIR) 250-50 MCG/DOSE AEPB Inhale 1 puff into the lungs 2 (two) times daily.    Marland Kitchen gabapentin (NEURONTIN) 300 MG capsule Take 300 mg by mouth at bedtime.    . isosorbide mononitrate (IMDUR) 30 MG 24 hr tablet TAKE 1 TABLET (30 MG TOTAL) BY MOUTH DAILY. 90 tablet 2  . lamoTRIgine (LAMICTAL) 100 MG tablet Take 200 mg by mouth daily.     Marland Kitchen lisinopril (PRINIVIL,ZESTRIL) 10 MG tablet Take 1 tablet (10 mg total) by mouth daily. 90 tablet 3  . metoprolol tartrate (LOPRESSOR) 25 MG tablet Take 0.5 tablets (12.5 mg  total) by mouth 2 (two) times daily. 90 tablet 3  . Multiple Vitamin (MULTIVITAMIN) capsule Take 1 capsule by mouth daily.    . niacin (NIASPAN) 1000 MG CR tablet TAKE 1 TABLET (1,000 MG TOTAL) BY MOUTH AT BEDTIME. 90 tablet 3  . nitroGLYCERIN (NITROSTAT) 0.4 MG SL tablet Place 1 tablet (0.4 mg total) under the tongue every 5 (five) minutes as needed. 25 tablet 3  . omega-3  acid ethyl esters (LOVAZA) 1 g capsule TAKE ONE CAPSULE BY MOUTH TWICE A DAY 60 capsule 2  . oxybutynin (DITROPAN-XL) 10 MG 24 hr tablet Take 30 mg by mouth daily. TAKES IN AM    . QUEtiapine (SEROQUEL) 25 MG tablet Take 125 mg by mouth at bedtime.     . senna (SENOKOT) 8.6 MG TABS tablet Take 2 tablets (17.2 mg total) by mouth 2 (two) times daily. 30 each 0  . SUPREP BOWEL PREP SOLN Take 1 kit by mouth once. 354 mL 0   No current facility-administered medications for this visit.    Allergies: Allergies  Allergen Reactions  . Codeine Other (See Comments)    "makes me psychotic"   . Haldol [Haloperidol Decanoate] Anaphylaxis  . Amoxicillin Hives  . Bee Venom   . Benadryl [Diphenhydramine Hcl]     Social History: The patient  reports that she quit smoking about 8 months ago. She has never used smokeless tobacco. She reports that she does not drink alcohol or use illicit drugs.   Family History: The patient's family history includes Anesthesia problems in her father; Colon cancer in her sister; Heart disease in her father.   Review of Systems: Please see the history of present illness.   Otherwise, the review of systems is positive for none.   All other systems are reviewed and negative.   Physical Exam: VS:  BP 124/76 mmHg  Pulse 51  Ht _0  (1.651 m)  Wt 199 lb (90.266 kg)  BMI 33.12 kg/m2  LMP 11/16/2011 .  BMI Body mass index is 33.12 kg/(m^2).  Wt Readings from Last 3 Encounters:  02/11/16 199 lb (90.266 kg)  11/16/15 193 lb 6.4 oz (87.726 kg)  06/03/15 191 lb 2 oz (86.694 kg)    General:  Pleasant. She is alert. She looks older than her stated age. She is in no acute distress.  HEENT: Normal. Neck: Supple, no JVD, carotid bruits, or masses noted.  Cardiac: Regular rate and rhythm. Soft outflow murmur. No edema.  Respiratory:  Lungs are coarse with normal work of breathing.  GI: Soft and nontender.  MS: No deformity or atrophy. Gait and ROM intact. Skin: Warm and dry. Color is normal.  Neuro:  Strength and sensation are intact and no gross focal deficits noted.  Psych: Alert, appropriate and with normal affect.   LABORATORY DATA:  EKG:  EKG is ordered today. This demonstrates sinus bradycardia with 1st degree AV block - prior inferior infarct, diffuse T wave changes.  Lab Results  Component Value Date   WBC 8.2 06/24/2014   HGB 16.7* 06/03/2015   HCT 49.0* 06/03/2015   PLT 228 06/24/2014   GLUCOSE 123* 06/03/2015   CHOL  07/17/2009    193        ATP III CLASSIFICATION:  <200     mg/dL   Desirable  200-239  mg/dL   Borderline High  >=240    mg/dL   High          TRIG 318* 07/17/2009   HDL 34* 07/17/2009   LDLCALC  07/17/2009    95        Total Cholesterol/HDL:CHD Risk Coronary Heart Disease Risk Table                     Men   Women  1/2 Average Risk   3.4   3.3  Average Risk       5.0   4.4  2  X Average Risk   9.6   7.1  3 X Average Risk  23.4   11.0        Use the calculated Patient Ratio above and the CHD Risk Table to determine the patient's CHD Risk.        ATP III CLASSIFICATION (LDL):  <100     mg/dL   Optimal  100-129  mg/dL   Near or Above                    Optimal  130-159  mg/dL   Borderline  160-189  mg/dL   High  >190     mg/dL   Very High   ALT 19 06/24/2014   AST 18 06/24/2014   NA 138 06/03/2015   K 4.6 06/03/2015   CL 104 06/03/2015   CREATININE 1.00 06/03/2015   BUN 23* 06/03/2015   CO2 25 06/24/2014   TSH 1.792 Test methodology is 3rd generation TSH 07/17/2009   INR 0.94 10/16/2012   HGBA1C  07/17/2009    6.0 (NOTE)  The ADA recommends the following therapeutic goal for glycemic control related to Hgb A1c measurement: Goal of therapy: <6.5 Hgb A1c  Reference: American Diabetes Association: Clinical Practice Recommendations 2010, Diabetes Care, 2010, 33: (Suppl  1).    BNP (last 3 results) No results for input(s): BNP in the last 8760 hours.  ProBNP (last 3 results) No results for input(s): PROBNP in the last 8760 hours.   Other Studies Reviewed Today:   Assessment/Plan: 1. CAD - with prior CABG per PVT and prior PCI - multiple risk factors - continues to smoke - having recurrent angina - cardiac cath is recommended. Your provider has recommended a cardiac catheterization. The patient understands that risks include but are not limited to stroke (1 in 1000), death (1 in 79), kidney failure [usually temporary] (1 in 500), bleeding (1 in 200), allergic reaction [possibly serious] (1 in 200), and agrees to proceed.  Will check stat Troponin as well - if this is abnormal - she will need to go on to the ER today. Cardiac cath arranged for next Tuesday.   2. HTN - ok on current regimen.   3. HLD - on statin  4. Tobacco abuse - does not seem ready to stop.   Current medicines are reviewed with the patient today.  The patient does not have concerns regarding medicines other than what has been noted above.  The following changes have been made:  See above.  Labs/ tests ordered today include:    Orders Placed This Encounter  Procedures  . DG Chest 2 View  . Basic metabolic panel  . CBC  . Hepatic function panel  . Protime-INR  . APTT  . Troponin I  . EKG 12-Lead     Disposition:   Further disposition to follow.    Patient is agreeable to this plan and will call if any problems develop in the interim.   Signed: Burtis Junes, RN, ANP-C 02/11/2016 9:08 AM  [facility] [address] [place], [place]  [address] Phone: [phone] Fax: [phone]

## 2016-02-15 NOTE — Discharge Instructions (Signed)
Radial Site Care °Refer to this sheet in the next few weeks. These instructions provide you with information about caring for yourself after your procedure. Your health care provider may also give you more specific instructions. Your treatment has been planned according to current medical practices, but problems sometimes occur. Call your health care provider if you have any problems or questions after your procedure. °WHAT TO EXPECT AFTER THE PROCEDURE °After your procedure, it is typical to have the following: °· Bruising at the radial site that usually fades within 1-2 weeks. °· Blood collecting in the tissue (hematoma) that may be painful to the touch. It should usually decrease in size and tenderness within 1-2 weeks. °HOME CARE INSTRUCTIONS °· Take medicines only as directed by your health care provider. °· You may shower 24-48 hours after the procedure or as directed by your health care provider. Remove the bandage (dressing) and gently wash the site with plain soap and water. Pat the area dry with a clean towel. Do not rub the site, because this may cause bleeding. °· Do not take baths, swim, or use a hot tub until your health care provider approves. °· Check your insertion site every day for redness, swelling, or drainage. °· Do not apply powder or lotion to the site. °· Do not flex or bend the affected arm for 24 hours or as directed by your health care provider. °· Do not push or pull heavy objects with the affected arm for 24 hours or as directed by your health care provider. °· Do not lift over 10 lb (4.5 kg) for 5 days after your procedure or as directed by your health care provider. °· Ask your health care provider when it is okay to: °¨ Return to work or school. °¨ Resume usual physical activities or sports. °¨ Resume sexual activity. °· Do not drive home if you are discharged the same day as the procedure. Have someone else drive you. °· You may drive 24 hours after the procedure unless otherwise  instructed by your health care provider. °· Do not operate machinery or power tools for 24 hours after the procedure. °· If your procedure was done as an outpatient procedure, which means that you went home the same day as your procedure, a responsible adult should be with you for the first 24 hours after you arrive home. °· Keep all follow-up visits as directed by your health care provider. This is important. °SEEK MEDICAL CARE IF: °· You have a fever. °· You have chills. °· You have increased bleeding from the radial site. Hold pressure on the site. °SEEK IMMEDIATE MEDICAL CARE IF: °· You have unusual pain at the radial site. °· You have redness, warmth, or swelling at the radial site. °· You have drainage (other than a small amount of blood on the dressing) from the radial site. °· The radial site is bleeding, and the bleeding does not stop after 30 minutes of holding steady pressure on the site. °· Your arm or hand becomes pale, cool, tingly, or numb. °  °This information is not intended to replace advice given to you by your health care provider. Make sure you discuss any questions you have with your health care provider. °  °Document Released: 09/09/2010 Document Revised: 08/28/2014 Document Reviewed: 02/23/2014 °Elsevier Interactive Patient Education ©2016 [employer] ° °

## 2016-02-15 NOTE — Interval H&P Note (Signed)
History and Physical Interval Note:  02/15/2016 7:32 AM  [name]  has presented today for surgery, with the diagnosis of [name]  The various methods of treatment have been discussed with the patient and family. After consideration of risks, benefits and other options for treatment, the patient has consented to  Procedure(s): Left Heart Cath and Cors/Grafts Angiography (N/A) as a surgical intervention .  The patient's history has been reviewed, patient examined, no change in status, stable for surgery.  I have reviewed the patient's chart and labs.  Questions were answered to the patient's satisfaction.   Cath Lab Visit (complete for each Cath Lab visit)  Clinical Evaluation Leading to the Procedure:   ACS: Yes.    Non-ACS:    Anginal Classification: CCS IV  Anti-ischemic medical therapy: Maximal Therapy (2 or more classes of medications)  Non-Invasive Test Results: No non-invasive testing performed  Prior CABG: No previous CABG        [name] [facility] 02/15/2016 7:32 AM

## 2016-03-04 ENCOUNTER — Other Ambulatory Visit: Payer: Self-pay | Admitting: Cardiovascular Disease

## 2016-03-06 NOTE — Telephone Encounter (Signed)
Rx(s) sent to pharmacy electronically.  

## 2016-03-29 ENCOUNTER — Encounter: Payer: Self-pay | Admitting: Cardiovascular Disease

## 2016-04-02 ENCOUNTER — Other Ambulatory Visit: Payer: Self-pay | Admitting: Cardiovascular Disease

## 2016-04-02 DIAGNOSIS — I10 Essential (primary) hypertension: Secondary | ICD-10-CM

## 2016-04-02 DIAGNOSIS — E785 Hyperlipidemia, unspecified: Secondary | ICD-10-CM

## 2016-04-07 ENCOUNTER — Other Ambulatory Visit: Payer: Self-pay | Admitting: Cardiovascular Disease

## 2016-04-07 ENCOUNTER — Ambulatory Visit (INDEPENDENT_AMBULATORY_CARE_PROVIDER_SITE_OTHER): Payer: Medicare Other | Admitting: Cardiovascular Disease

## 2016-04-07 ENCOUNTER — Encounter: Payer: Self-pay | Admitting: Cardiovascular Disease

## 2016-04-07 VITALS — BP 150/80 | HR 64 | Ht 65.0 in | Wt 197.8 lb

## 2016-04-07 DIAGNOSIS — E785 Hyperlipidemia, unspecified: Secondary | ICD-10-CM

## 2016-04-07 DIAGNOSIS — I10 Essential (primary) hypertension: Secondary | ICD-10-CM

## 2016-04-07 DIAGNOSIS — I25709 Atherosclerosis of coronary artery bypass graft(s), unspecified, with unspecified angina pectoris: Secondary | ICD-10-CM

## 2016-04-07 DIAGNOSIS — I259 Chronic ischemic heart disease, unspecified: Secondary | ICD-10-CM

## 2016-04-07 NOTE — Assessment & Plan Note (Signed)
History of coronary artery disease status post coronary artery bypass grafting November 2008 with a LIMA to LAD, vein to ramus branch and PDA. She had a heart catheterization performed 03/04/10 revealing patent grafts and normal LV function. Myoview performed 04/18/11 was nonischemic. Because of new onset chest pain she saw Truitt Merle nurse practitioner in the office on 02/11/16 who arranged for her to have a cardiac catheterization performed 4 days later by Dr. Peter Martinique. He demonstrated again patent grafts and normal LV function. Her anatomy was unchanged from her 2011 crit catheterization I was assuming that her chest pain was noncardiac. Since that time she's had no recurrent chest pain.

## 2016-04-07 NOTE — Patient Instructions (Signed)
Medication Instructions:  Your physician recommends that you continue on your current medications as directed. Please refer to the Current Medication list given to you today.   Labwork: Labwork will be requested from your primary care physician.   Follow-Up: We request that you follow-up in: 6 MONTHS with an extender and in 12 MONTHS with Dr Andria Rhein will receive a reminder letter in the mail two months in advance. If you don't receive a letter, please call our office to schedule the follow-up appointment.  If you need a refill on your cardiac medications before your next appointment, please call your pharmacy.

## 2016-04-07 NOTE — Progress Notes (Signed)
04/07/2016 [name]   Dec 04, 1960  [id number]  Primary Physician Angela Obey, MD Primary Cardiologist: Angela Harp MD [name], [place]  HPI:    [name] is a 55 year old severely overweight divorced Caucasian female mother of 2, grandmother and 3 grandchildren who I last saw in the office 03/17/15. Her mother, [name] , as also patient of mine. She has a history of RCA stenting by Dr. Dr. Doylene Walsh in the past. She had coronary artery bypass grafting November 2008 with LIMA to LAD, vein to ramus branch and PDA. She recently stopped smoking 4 months ago. Her palms include hypertension, hyperlipidemia and obstructive sleep apnea intolerant to see. I catheterized her 03/04/10 revealing patent grafts and normal LV function. Her last  Myoview performed 04/18/11 with nonischemic. She was experiencing a lot of stress in her life and developed chest pain. She saw [name], nurse practitioner on 02/11/16. At that time, she was up for cardiac catheterization which was performed 4 days later by Dr. Peter Walsh. He demonstrated widely patent grafts, normal LV function and unchanged anatomy compared to her cath performed in 2011. Since that time she is experienced no further chest pain.   Current Outpatient Prescriptions  Medication Sig Dispense Refill  . albuterol (PROVENTIL HFA;VENTOLIN HFA) 108 (90 BASE) MCG/ACT inhaler Inhale 2 puffs into the lungs every 6 (six) hours as needed for wheezing.    Marland Kitchen aspirin 81 MG chewable tablet Chew 81 mg by mouth daily.    Marland Kitchen atorvastatin (LIPITOR) 80 MG tablet Take 1 tablet (80 mg total) by mouth at bedtime. 90 tablet 3  . brimonidine (ALPHAGAN) 0.15 % ophthalmic solution Place 1 drop into both eyes 2 (two) times daily. 2 drops left eye BID; 1 drop right eye BID    . Fluticasone-Salmeterol (ADVAIR) 250-50 MCG/DOSE AEPB Inhale 1 puff into the lungs 2 (two) times daily.    Marland Kitchen gabapentin (NEURONTIN) 300 MG capsule Take 300 mg by mouth at  bedtime.    . isosorbide mononitrate (IMDUR) 30 MG 24 hr tablet TAKE 2 TABLETS (60 MG TOTAL) BY MOUTH DAILY. 90 tablet 2  . lamoTRIgine (LAMICTAL) 100 MG tablet Take 200 mg by mouth daily.     Marland Kitchen lisinopril (PRINIVIL,ZESTRIL) 10 MG tablet TAKE 1 TABLET (10 MG TOTAL) BY MOUTH DAILY. 90 tablet 3  . metoprolol tartrate (LOPRESSOR) 25 MG tablet Take 0.5 tablets (12.5 mg total) by mouth 2 (two) times daily. 90 tablet 3  . Multiple Vitamin (MULTIVITAMIN) capsule Take 1 capsule by mouth daily.    . niacin (NIASPAN) 1000 MG CR tablet TAKE 1 TABLET (1,000 MG TOTAL) BY MOUTH AT BEDTIME. 90 tablet 3  . nitroGLYCERIN (NITROSTAT) 0.4 MG SL tablet Place 1 tablet (0.4 mg total) under the tongue every 5 (five) minutes as needed. 25 tablet 3  . omega-3 acid ethyl esters (LOVAZA) 1 g capsule TAKE ONE CAPSULE BY MOUTH TWICE A DAY 60 capsule 11  . oxybutynin (DITROPAN XL) 15 MG 24 hr tablet Take 15 mg by mouth at bedtime.    Marland Kitchen QUEtiapine (SEROQUEL) 25 MG tablet Take 150 mg by mouth at bedtime. Reported on 02/11/2016    . senna (SENOKOT) 8.6 MG TABS tablet Take 2 tablets (17.2 mg total) by mouth 2 (two) times daily. (Patient taking differently: Take 2 tablets by mouth daily as needed for mild constipation. ) 30 each 0  . Ascorbic Acid (VITAMIN C) 100 MG tablet Take 100 mg by mouth daily.    Marland Kitchen  buPROPion (WELLBUTRIN XL) 150 MG 24 hr tablet Take 150 mg by mouth daily.    . cholecalciferol (VITAMIN D) 1000 units tablet Take 1,000 Units by mouth daily.    . ferrous sulfate 325 (65 FE) MG tablet Take 325 mg by mouth daily with breakfast.     No current facility-administered medications for this visit.     Allergies  Allergen Reactions  . Codeine Other (See Comments)    "makes me psychotic"   . Haldol [Haloperidol Decanoate] Anaphylaxis  . Amoxicillin Hives  . Bee Venom   . Benadryl [Diphenhydramine Hcl]     Social History   Social History  . Marital status: Divorced    Spouse name: N/A  . Number of children:  N/A  . Years of education: N/A   Occupational History  . Not on file.   Social History Main Topics  . Smoking status: Former Smoker    Packs/day: 0.00    Years: 0.00    Quit date: 05/21/2015  . Smokeless tobacco: Never Used  . Alcohol use No  . Drug use: No  . Sexual activity: Not on file   Other Topics Concern  . Not on file   Social History Narrative  . No narrative on file     Review of Systems: General: negative for chills, fever, night sweats or weight changes.  Cardiovascular: negative for chest pain, dyspnea on exertion, edema, orthopnea, palpitations, paroxysmal nocturnal dyspnea or shortness of breath Dermatological: negative for rash Respiratory: negative for cough or wheezing Urologic: negative for hematuria Abdominal: negative for nausea, vomiting, diarrhea, bright red blood per rectum, melena, or hematemesis Neurologic: negative for visual changes, syncope, or dizziness All other systems reviewed and are otherwise negative except as noted above.    Blood pressure (!) 150/80, pulse 64, height 5\' 5"  (1.651 m), weight 197 lb 12.8 oz (89.7 kg), last menstrual period 11/16/2011.  General appearance: alert and no distress Neck: no adenopathy, no carotid bruit, no JVD, supple, symmetrical, trachea midline and thyroid not enlarged, symmetric, no tenderness/mass/nodules Lungs: clear to auscultation bilaterally Heart: regular rate and rhythm, S1, S2 normal, no murmur, click, rub or gallop Extremities: extremities normal, atraumatic, no cyanosis or edema  EKG normal sinus rhythm at 64 with an RV conduction delay. There were Q waves in the inferior leads and nonspecific ST and T-wave changes. I personally reviewed this EKG  ASSESSMENT AND PLAN:   Hyperlipidemia LDL goal <70 History of hyperlipidemia on statin therapy followed by her PCP  Coronary artery disease involving coronary bypass graft History of coronary artery disease status post coronary artery bypass  grafting November 2008 with a LIMA to LAD, vein to ramus branch and PDA. She had a heart catheterization performed 03/04/10 revealing patent grafts and normal LV function. Myoview performed 04/18/11 was nonischemic. Because of new onset chest pain she saw [name] nurse practitioner in the office on 02/11/16 who arranged for her to have a cardiac catheterization performed 4 days later by Dr. Peter Walsh. He demonstrated again patent grafts and normal LV function. Her anatomy was unchanged from her 2011 crit catheterization I was assuming that her chest pain was noncardiac. Since that time she's had no recurrent chest pain.  Essential hypertension History of hypertension blood pressure is 150/80. She is on isosorbide, lisinopril and metoprolol. Continue current meds at current dosing      Angela Harp MD [facility], [facility] 04/07/2016 7:54 AM

## 2016-04-07 NOTE — Assessment & Plan Note (Signed)
History of hypertension blood pressure is 150/80. She is on isosorbide, lisinopril and metoprolol. Continue current meds at current dosing

## 2016-04-07 NOTE — Assessment & Plan Note (Signed)
History of hyperlipidemia on statin therapy followed by her PCP. 

## 2016-05-11 ENCOUNTER — Encounter: Payer: Self-pay | Admitting: Internal Medicine

## 2016-06-21 ENCOUNTER — Other Ambulatory Visit: Payer: Self-pay | Admitting: Cardiovascular Disease

## 2016-06-21 NOTE — Telephone Encounter (Signed)
Rx request sent to pharmacy.  

## 2016-07-10 DIAGNOSIS — D72829 Elevated white blood cell count, unspecified: Secondary | ICD-10-CM | POA: Insufficient documentation

## 2016-08-01 ENCOUNTER — Ambulatory Visit: Payer: Medicare Other | Admitting: Internal Medicine

## 2016-08-16 ENCOUNTER — Ambulatory Visit: Payer: Medicare Other | Admitting: Physician Assistant

## 2016-08-22 DIAGNOSIS — F3112 Bipolar disorder, current episode manic without psychotic features, moderate: Secondary | ICD-10-CM | POA: Diagnosis not present

## 2016-09-11 DIAGNOSIS — H2513 Age-related nuclear cataract, bilateral: Secondary | ICD-10-CM | POA: Diagnosis not present

## 2016-09-11 DIAGNOSIS — D3131 Benign neoplasm of right choroid: Secondary | ICD-10-CM | POA: Diagnosis not present

## 2016-09-11 DIAGNOSIS — H40213 Acute angle-closure glaucoma, bilateral: Secondary | ICD-10-CM | POA: Diagnosis not present

## 2016-09-30 ENCOUNTER — Other Ambulatory Visit: Payer: Self-pay | Admitting: Cardiovascular Disease

## 2016-09-30 DIAGNOSIS — E785 Hyperlipidemia, unspecified: Secondary | ICD-10-CM

## 2016-09-30 DIAGNOSIS — I10 Essential (primary) hypertension: Secondary | ICD-10-CM

## 2016-11-22 ENCOUNTER — Other Ambulatory Visit: Payer: Self-pay | Admitting: Cardiovascular Disease

## 2017-01-01 DIAGNOSIS — M79671 Pain in right foot: Secondary | ICD-10-CM | POA: Insufficient documentation

## 2017-01-01 DIAGNOSIS — Z1231 Encounter for screening mammogram for malignant neoplasm of breast: Secondary | ICD-10-CM | POA: Diagnosis not present

## 2017-01-01 DIAGNOSIS — Z720 Tobacco use: Secondary | ICD-10-CM | POA: Diagnosis not present

## 2017-01-01 DIAGNOSIS — R7303 Prediabetes: Secondary | ICD-10-CM | POA: Diagnosis not present

## 2017-01-01 DIAGNOSIS — Z124 Encounter for screening for malignant neoplasm of cervix: Secondary | ICD-10-CM | POA: Diagnosis not present

## 2017-01-01 DIAGNOSIS — Z122 Encounter for screening for malignant neoplasm of respiratory organs: Secondary | ICD-10-CM | POA: Diagnosis not present

## 2017-01-01 DIAGNOSIS — I10 Essential (primary) hypertension: Secondary | ICD-10-CM | POA: Diagnosis not present

## 2017-01-16 ENCOUNTER — Other Ambulatory Visit: Payer: Self-pay | Admitting: Cardiovascular Disease

## 2017-01-22 ENCOUNTER — Telehealth: Payer: Self-pay | Admitting: Cardiovascular Disease

## 2017-01-22 NOTE — Telephone Encounter (Signed)
Called the patient and LVM for her to call and schedule an appointment with Dr. Gwenlyn Found.

## 2017-02-12 ENCOUNTER — Other Ambulatory Visit: Payer: Self-pay | Admitting: Family Medicine

## 2017-02-12 DIAGNOSIS — Z1231 Encounter for screening mammogram for malignant neoplasm of breast: Secondary | ICD-10-CM

## 2017-02-16 ENCOUNTER — Ambulatory Visit (INDEPENDENT_AMBULATORY_CARE_PROVIDER_SITE_OTHER): Payer: Medicare Other | Admitting: Cardiovascular Disease

## 2017-02-16 ENCOUNTER — Encounter: Payer: Self-pay | Admitting: Cardiovascular Disease

## 2017-02-16 DIAGNOSIS — I25700 Atherosclerosis of coronary artery bypass graft(s), unspecified, with unstable angina pectoris: Secondary | ICD-10-CM

## 2017-02-16 DIAGNOSIS — E785 Hyperlipidemia, unspecified: Secondary | ICD-10-CM | POA: Diagnosis not present

## 2017-02-16 DIAGNOSIS — I10 Essential (primary) hypertension: Secondary | ICD-10-CM

## 2017-02-16 DIAGNOSIS — I200 Unstable angina: Secondary | ICD-10-CM

## 2017-02-16 NOTE — Assessment & Plan Note (Signed)
History of essential hypertension blood pressure measures 125/78. She is on lisinopril and metoprolol. Continue current meds at current dosing

## 2017-02-16 NOTE — Assessment & Plan Note (Signed)
History of CAD status post RCA stenting in the past ultimately requiring coronary artery bypass grafting November 2008 with a LIMA to LAD, vein to ramus branch and PDA. She has stopped smoking. I catheterized her 03/04/10 revealing patent grafts and normal LV function and Dr. Martinique catheterized her 02/15/16 revealing similar anatomy.She denies chest pain or shortness of breath.

## 2017-02-16 NOTE — Assessment & Plan Note (Signed)
History of hyperlipidemia on statin therapy followed by her PCP. 

## 2017-02-16 NOTE — Patient Instructions (Signed)
Medication Instructions: Your physician recommends that you continue on your current medications as directed. Please refer to the Current Medication list given to you today.  Labwork: I will request labs from Dr. Rayetta Pigg office.  Follow-Up: Your physician wants you to follow-up in: 1 year with Dr. Gwenlyn Found. You will receive a reminder letter in the mail two months in advance. If you don't receive a letter, please call our office to schedule the follow-up appointment.  If you need a refill on your cardiac medications before your next appointment, please call your pharmacy.

## 2017-02-16 NOTE — Progress Notes (Signed)
02/16/2017 [name]   02/06/1961  [id number]  Primary Physician Doreene Nest, [name], MD Primary Cardiologist: Lorretta Harp MD Lupe Carney, [place]  HPI:  Miss [name] is a 56 year old severely overweight divorced Caucasian female mother of 2, grandmother and 3 grandchildren who I last saw in the office 04/07/16. Her mother, [name] , as also patient of mine. 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 Dr. Doylene Canard in the past. She had coronary artery bypass grafting November 2008 with LIMA to LAD, vein to ramus branch and PDA. She recently stopped smoking 4 months ago. Her palms include hypertension, hyperlipidemia and obstructive sleep apnea intolerant to see. I catheterized her 03/04/10 revealing patent grafts and normal LV function. Her last  Myoview performed 04/18/11 with nonischemic. She was experiencing a lot of stress in her life and developed chest pain. She saw Truitt Merle, nurse practitioner on 02/11/16. At that time, she was up for cardiac catheterization which was performed 4 days later by Dr. Peter Martinique. He demonstrated widely patent grafts, normal LV function and unchanged anatomy compared to her cath performed in 2011. Since that time she is experienced no further chest pain.   Current Outpatient Prescriptions  Medication Sig Dispense Refill  . albuterol (PROVENTIL HFA;VENTOLIN HFA) 108 (90 BASE) MCG/ACT inhaler Inhale 2 puffs into the lungs every 6 (six) hours as needed for wheezing.    . Ascorbic Acid (VITAMIN C) 100 MG tablet Take 100 mg by mouth daily.    Marland Kitchen aspirin 81 MG chewable tablet Chew 81 mg by mouth daily.    Marland Kitchen atorvastatin (LIPITOR) 80 MG tablet TAKE 1 TABLET (80 MG TOTAL) BY MOUTH AT BEDTIME. 90 tablet 3  . brimonidine (ALPHAGAN) 0.15 % ophthalmic solution Place 1 drop into both eyes 2 (two) times daily. 2 drops left eye BID; 1 drop right eye BID    . cholecalciferol (VITAMIN D)  1000 units tablet Take 1,000 Units by mouth daily.    . ferrous sulfate 325 (65 FE) MG tablet Take 325 mg by mouth daily with breakfast.    . Fluticasone-Salmeterol (ADVAIR) 250-50 MCG/DOSE AEPB Inhale 1 puff into the lungs 2 (two) times daily.    . isosorbide mononitrate (IMDUR) 30 MG 24 hr tablet TAKE 1 TABLET (30 MG TOTAL) BY MOUTH DAILY. 90 tablet 0  . lamoTRIgine (LAMICTAL) 100 MG tablet Take 200 mg by mouth daily.     Marland Kitchen lisinopril (PRINIVIL,ZESTRIL) 10 MG tablet TAKE 1 TABLET (10 MG TOTAL) BY MOUTH DAILY. 90 tablet 3  . metoprolol tartrate (LOPRESSOR) 25 MG tablet TAKE 0.5 TABLETS (12.5 MG TOTAL) BY MOUTH 2 (TWO) TIMES DAILY. 90 tablet 3  . niacin (NIASPAN) 1000 MG CR tablet TAKE 1 TABLET (1,000 MG TOTAL) BY MOUTH AT BEDTIME. 90 tablet 3  . nitroGLYCERIN (NITROSTAT) 0.4 MG SL tablet Place 1 tablet (0.4 mg total) under the tongue every 5 (five) minutes as needed. 25 tablet 3  . omega-3 acid ethyl esters (LOVAZA) 1 g capsule TAKE ONE CAPSULE BY MOUTH TWICE A DAY 60 capsule 11  . oxybutynin (DITROPAN XL) 15 MG 24 hr tablet Take 15 mg by mouth at bedtime.    Marland Kitchen QUEtiapine (SEROQUEL) 25 MG tablet Take 150 mg by mouth at bedtime. Reported on 02/11/2016     No current facility-administered medications for this visit.     Allergies  Allergen Reactions  . Codeine Other (See  Comments)    "makes me psychotic"   . Haldol [Haloperidol Decanoate] Anaphylaxis  . Amoxicillin Hives  . Bee Venom   . Benadryl [Diphenhydramine Hcl]     Social History   Social History  . Marital status: Divorced    Spouse name: N/A  . Number of children: N/A  . Years of education: N/A   Occupational History  . Not on file.   Social History Main Topics  . Smoking status: Former Smoker    Packs/day: 0.00    Years: 0.00    Quit date: 05/21/2015  . Smokeless tobacco: Never Used  . Alcohol use No  . Drug use: No  . Sexual activity: Not on file   Other Topics Concern  . Not on file   Social History  Narrative  . No narrative on file     Review of Systems: General: negative for chills, fever, night sweats or weight changes.  Cardiovascular: negative for chest pain, dyspnea on exertion, edema, orthopnea, palpitations, paroxysmal nocturnal dyspnea or shortness of breath Dermatological: negative for rash Respiratory: negative for cough or wheezing Urologic: negative for hematuria Abdominal: negative for nausea, vomiting, diarrhea, bright red blood per rectum, melena, or hematemesis Neurologic: negative for visual changes, syncope, or dizziness All other systems reviewed and are otherwise negative except as noted above.    Blood pressure 125/78, pulse 69, height 5\' 5"  (1.651 m), weight 177 lb 9.6 oz (80.6 kg), last menstrual period 11/16/2011.  General appearance: alert and no distress Neck: no adenopathy, no carotid bruit, no JVD, supple, symmetrical, trachea midline and thyroid not enlarged, symmetric, no tenderness/mass/nodules Lungs: clear to auscultation bilaterally Heart: regular rate and rhythm, S1, S2 normal, no murmur, click, rub or gallop Extremities: extremities normal, atraumatic, no cyanosis or edema  EKG Sinus rhythm at 69 with an RV conduction delay, inferior Q waves and nonspecific ST and T-wave changes. There was early R-wave transition. I personally reviewed this EKG. There were no changes compared to prior tracings.  ASSESSMENT AND PLAN:   Hyperlipidemia LDL goal <70 History of hyperlipidemia on statin therapy followed by her PCP  Essential hypertension History of essential hypertension blood pressure measures 125/78. She is on lisinopril and metoprolol. Continue current meds at current dosing  Coronary artery disease involving coronary bypass graft History of CAD status post RCA stenting in the past ultimately requiring coronary artery bypass grafting November 2008 with a LIMA to LAD, vein to ramus branch and PDA. She has stopped smoking. I catheterized her  03/04/10 revealing patent grafts and normal LV function and Dr. Martinique catheterized her 02/15/16 revealing similar anatomy.She denies chest pain or shortness of breath.      Lorretta Harp MD FACP,FACC,FAHA, [facility] 02/16/2017 9:07 AM

## 2017-02-19 DIAGNOSIS — M7671 Peroneal tendinitis, right leg: Secondary | ICD-10-CM | POA: Diagnosis not present

## 2017-02-19 DIAGNOSIS — G8929 Other chronic pain: Secondary | ICD-10-CM | POA: Diagnosis not present

## 2017-02-19 DIAGNOSIS — M25571 Pain in right ankle and joints of right foot: Secondary | ICD-10-CM | POA: Diagnosis not present

## 2017-02-20 ENCOUNTER — Ambulatory Visit
Admission: RE | Admit: 2017-02-20 | Discharge: 2017-02-20 | Disposition: A | Discharge status: Home / Self Care | Payer: Medicare Other | Source: Ambulatory Visit | Attending: Family Medicine | Admitting: Family Medicine

## 2017-02-20 DIAGNOSIS — Z1231 Encounter for screening mammogram for malignant neoplasm of breast: Secondary | ICD-10-CM | POA: Diagnosis not present

## 2017-03-10 ENCOUNTER — Other Ambulatory Visit: Payer: Self-pay | Admitting: Cardiovascular Disease

## 2017-03-10 DIAGNOSIS — E785 Hyperlipidemia, unspecified: Secondary | ICD-10-CM

## 2017-03-10 DIAGNOSIS — I10 Essential (primary) hypertension: Secondary | ICD-10-CM

## 2017-03-16 ENCOUNTER — Ambulatory Visit: Payer: Medicare Other | Attending: Student | Admitting: Physical Therapy

## 2017-04-10 ENCOUNTER — Ambulatory Visit: Payer: Medicare Other | Admitting: Cardiovascular Disease

## 2017-05-07 ENCOUNTER — Other Ambulatory Visit: Payer: Self-pay | Admitting: Cardiovascular Disease

## 2017-06-05 ENCOUNTER — Other Ambulatory Visit: Payer: Self-pay

## 2017-06-05 DIAGNOSIS — E785 Hyperlipidemia, unspecified: Secondary | ICD-10-CM

## 2017-06-05 DIAGNOSIS — I10 Essential (primary) hypertension: Secondary | ICD-10-CM

## 2017-06-05 MED ORDER — ATORVASTATIN CALCIUM 80 MG PO TABS: 80.0000 mg | ORAL_TABLET | Freq: Every day | ORAL | 3 refills | Status: DC

## 2017-06-05 MED ORDER — NIACIN ER (ANTIHYPERLIPIDEMIC) 1000 MG PO TBCR: EXTENDED_RELEASE_TABLET | ORAL | 3 refills | Status: DC

## 2017-06-19 DIAGNOSIS — F3112 Bipolar disorder, current episode manic without psychotic features, moderate: Secondary | ICD-10-CM | POA: Diagnosis not present

## 2017-07-02 ENCOUNTER — Other Ambulatory Visit: Payer: Self-pay | Admitting: *Deleted

## 2017-07-02 MED ORDER — OMEGA-3-ACID ETHYL ESTERS 1 G PO CAPS: 1.0000 | ORAL_CAPSULE | Freq: Two times a day (BID) | ORAL | 3 refills | Status: DC

## 2017-08-06 ENCOUNTER — Other Ambulatory Visit: Payer: Self-pay | Admitting: *Deleted

## 2017-08-06 DIAGNOSIS — E785 Hyperlipidemia, unspecified: Secondary | ICD-10-CM

## 2017-08-06 DIAGNOSIS — I10 Essential (primary) hypertension: Secondary | ICD-10-CM

## 2017-08-06 MED ORDER — METOPROLOL TARTRATE 25 MG PO TABS: 12.5000 mg | ORAL_TABLET | Freq: Two times a day (BID) | ORAL | 1 refills | Status: DC

## 2017-08-06 MED ORDER — LISINOPRIL 10 MG PO TABS: 10.0000 mg | ORAL_TABLET | Freq: Every day | ORAL | 1 refills | Status: DC

## 2017-09-24 DIAGNOSIS — I10 Essential (primary) hypertension: Secondary | ICD-10-CM | POA: Diagnosis not present

## 2017-09-24 DIAGNOSIS — Z1211 Encounter for screening for malignant neoplasm of colon: Secondary | ICD-10-CM | POA: Diagnosis not present

## 2017-09-24 DIAGNOSIS — I2510 Atherosclerotic heart disease of native coronary artery without angina pectoris: Secondary | ICD-10-CM | POA: Diagnosis not present

## 2017-09-24 DIAGNOSIS — J449 Chronic obstructive pulmonary disease, unspecified: Secondary | ICD-10-CM | POA: Diagnosis not present

## 2017-09-24 DIAGNOSIS — Z0000 Encounter for general adult medical examination without abnormal findings: Secondary | ICD-10-CM | POA: Diagnosis not present

## 2017-09-24 DIAGNOSIS — R7989 Other specified abnormal findings of blood chemistry: Secondary | ICD-10-CM | POA: Diagnosis not present

## 2017-09-24 DIAGNOSIS — F319 Bipolar disorder, unspecified: Secondary | ICD-10-CM | POA: Diagnosis not present

## 2017-09-24 DIAGNOSIS — R7303 Prediabetes: Secondary | ICD-10-CM | POA: Diagnosis not present

## 2017-09-27 DIAGNOSIS — F319 Bipolar disorder, unspecified: Secondary | ICD-10-CM | POA: Diagnosis not present

## 2017-10-29 ENCOUNTER — Encounter (HOSPITAL_COMMUNITY): Payer: Self-pay

## 2017-10-29 ENCOUNTER — Emergency Department (HOSPITAL_COMMUNITY)
Admission: EM | Admit: 2017-10-29 | Discharge: 2017-10-29 | Disposition: A | Discharge status: Home / Self Care | Payer: Medicare Other | Attending: Emergency Medicine | Admitting: Emergency Medicine

## 2017-10-29 DIAGNOSIS — Z5321 Procedure and treatment not carried out due to patient leaving prior to being seen by health care provider: Secondary | ICD-10-CM | POA: Diagnosis not present

## 2017-10-29 DIAGNOSIS — R109 Unspecified abdominal pain: Secondary | ICD-10-CM | POA: Diagnosis not present

## 2017-10-29 LAB — COMPREHENSIVE METABOLIC PANEL
ALT: 42 U/L (ref 14–54)
AST: 33 U/L (ref 15–41)
Albumin: 4.2 g/dL (ref 3.5–5.0)
Alkaline Phosphatase: 104 U/L (ref 38–126)
Anion gap: 13 (ref 5–15)
BUN: 20 mg/dL (ref 6–20)
CO2: 23 mmol/L (ref 22–32)
Calcium: 9.7 mg/dL (ref 8.9–10.3)
Chloride: 102 mmol/L (ref 101–111)
Creatinine, Ser: 0.94 mg/dL (ref 0.44–1.00)
GFR calc Af Amer: >60 mL/min (ref >60)
GFR calc non Af Amer: >60 mL/min (ref >60)
Glucose, Bld: 126 mg/dL — ABNORMAL HIGH (ref 65–99)
Potassium: 4.2 mmol/L (ref 3.5–5.1)
Sodium: 138 mmol/L (ref 135–145)
Total Bilirubin: 0.5 mg/dL (ref 0.3–1.2)
Total Protein: 7.8 g/dL (ref 6.5–8.1)

## 2017-10-29 LAB — CBC
HCT: 44.1 % (ref 36.0–46.0)
Hemoglobin: 15.5 g/dL — ABNORMAL HIGH (ref 12.0–15.0)
MCH: 34.1 pg — ABNORMAL HIGH (ref 26.0–34.0)
MCHC: 35.1 g/dL (ref 30.0–36.0)
MCV: 96.9 fL (ref 78.0–100.0)
Platelets: 288 10*3/uL (ref 150–400)
RBC: 4.55 MIL/uL (ref 3.87–5.11)
RDW: 14 % (ref 11.5–15.5)
WBC: 16.5 10*3/uL — ABNORMAL HIGH (ref 4.0–10.5)

## 2017-10-29 LAB — I-STAT BETA HCG BLOOD, ED (MC, WL, AP ONLY): I-stat hCG, quantitative: 11.1 m[IU]/mL — ABNORMAL HIGH (ref <5)

## 2017-10-29 LAB — LIPASE, BLOOD: Lipase: 29 U/L (ref 11–51)

## 2017-10-29 NOTE — ED Triage Notes (Signed)
Pt presents with c/o abdominal pain and vomiting as well as left shoulder, neck, and arm pain. Pt reports she has had the shoulder, neck, and arm pain for approx 2 months and it is not getting any better. Pt reports the vomiting started this morning. Pt reports she did have a fall several months ago and is unsure if that is related to the pain in her neck, arm, and shoulder.

## 2017-10-31 DIAGNOSIS — F3112 Bipolar disorder, current episode manic without psychotic features, moderate: Secondary | ICD-10-CM | POA: Diagnosis not present

## 2017-11-12 DIAGNOSIS — H40013 Open angle with borderline findings, low risk, bilateral: Secondary | ICD-10-CM | POA: Diagnosis not present

## 2017-11-12 DIAGNOSIS — D3131 Benign neoplasm of right choroid: Secondary | ICD-10-CM | POA: Diagnosis not present

## 2017-11-12 DIAGNOSIS — H2513 Age-related nuclear cataract, bilateral: Secondary | ICD-10-CM | POA: Diagnosis not present

## 2017-11-12 DIAGNOSIS — H40213 Acute angle-closure glaucoma, bilateral: Secondary | ICD-10-CM | POA: Diagnosis not present

## 2017-11-22 DIAGNOSIS — F3112 Bipolar disorder, current episode manic without psychotic features, moderate: Secondary | ICD-10-CM | POA: Diagnosis not present

## 2017-12-24 DIAGNOSIS — D72829 Elevated white blood cell count, unspecified: Secondary | ICD-10-CM | POA: Diagnosis not present

## 2017-12-24 DIAGNOSIS — J449 Chronic obstructive pulmonary disease, unspecified: Secondary | ICD-10-CM | POA: Diagnosis not present

## 2017-12-24 DIAGNOSIS — I10 Essential (primary) hypertension: Secondary | ICD-10-CM | POA: Diagnosis not present

## 2017-12-24 DIAGNOSIS — F3170 Bipolar disorder, currently in remission, most recent episode unspecified: Secondary | ICD-10-CM | POA: Diagnosis not present

## 2017-12-24 DIAGNOSIS — E669 Obesity, unspecified: Secondary | ICD-10-CM | POA: Diagnosis not present

## 2017-12-24 DIAGNOSIS — I2510 Atherosclerotic heart disease of native coronary artery without angina pectoris: Secondary | ICD-10-CM | POA: Diagnosis not present

## 2018-03-07 DIAGNOSIS — F4310 Post-traumatic stress disorder, unspecified: Secondary | ICD-10-CM | POA: Diagnosis not present

## 2018-03-07 DIAGNOSIS — F3112 Bipolar disorder, current episode manic without psychotic features, moderate: Secondary | ICD-10-CM | POA: Diagnosis not present

## 2018-03-19 ENCOUNTER — Telehealth: Payer: Self-pay | Admitting: Cardiovascular Disease

## 2018-03-19 DIAGNOSIS — E785 Hyperlipidemia, unspecified: Secondary | ICD-10-CM

## 2018-03-19 DIAGNOSIS — I10 Essential (primary) hypertension: Secondary | ICD-10-CM

## 2018-03-19 MED ORDER — NITROGLYCERIN 0.4 MG SL SUBL: 0.4000 mg | SUBLINGUAL_TABLET | SUBLINGUAL | 3 refills | Status: DC | PRN

## 2018-03-19 NOTE — Telephone Encounter (Signed)
Rx request sent to pharmacy.  

## 2018-05-07 ENCOUNTER — Other Ambulatory Visit: Payer: Self-pay | Admitting: Family Medicine

## 2018-05-07 DIAGNOSIS — Z1231 Encounter for screening mammogram for malignant neoplasm of breast: Secondary | ICD-10-CM

## 2018-05-11 ENCOUNTER — Other Ambulatory Visit: Payer: Self-pay | Admitting: Cardiovascular Disease

## 2018-05-11 DIAGNOSIS — E785 Hyperlipidemia, unspecified: Secondary | ICD-10-CM

## 2018-05-11 DIAGNOSIS — I10 Essential (primary) hypertension: Secondary | ICD-10-CM

## 2018-05-15 DIAGNOSIS — F4310 Post-traumatic stress disorder, unspecified: Secondary | ICD-10-CM | POA: Diagnosis not present

## 2018-05-15 DIAGNOSIS — F3112 Bipolar disorder, current episode manic without psychotic features, moderate: Secondary | ICD-10-CM | POA: Diagnosis not present

## 2018-05-28 ENCOUNTER — Other Ambulatory Visit: Payer: Self-pay | Admitting: Cardiovascular Disease

## 2018-06-04 ENCOUNTER — Ambulatory Visit: Payer: Medicare Other

## 2018-06-11 ENCOUNTER — Other Ambulatory Visit: Payer: Self-pay | Admitting: Cardiovascular Disease

## 2018-06-11 DIAGNOSIS — E785 Hyperlipidemia, unspecified: Secondary | ICD-10-CM

## 2018-06-11 DIAGNOSIS — I10 Essential (primary) hypertension: Secondary | ICD-10-CM

## 2018-08-02 ENCOUNTER — Other Ambulatory Visit: Payer: Self-pay | Admitting: Cardiovascular Disease

## 2018-08-02 DIAGNOSIS — I10 Essential (primary) hypertension: Secondary | ICD-10-CM

## 2018-08-02 DIAGNOSIS — E785 Hyperlipidemia, unspecified: Secondary | ICD-10-CM

## 2018-08-02 NOTE — Telephone Encounter (Signed)
Rx request sent to pharmacy.  

## 2018-08-09 ENCOUNTER — Other Ambulatory Visit: Payer: Self-pay | Admitting: Cardiovascular Disease

## 2018-08-09 DIAGNOSIS — E785 Hyperlipidemia, unspecified: Secondary | ICD-10-CM

## 2018-08-09 DIAGNOSIS — I10 Essential (primary) hypertension: Secondary | ICD-10-CM

## 2018-08-18 ENCOUNTER — Other Ambulatory Visit: Payer: Self-pay | Admitting: Cardiovascular Disease

## 2018-08-18 DIAGNOSIS — I10 Essential (primary) hypertension: Secondary | ICD-10-CM

## 2018-08-18 DIAGNOSIS — E785 Hyperlipidemia, unspecified: Secondary | ICD-10-CM

## 2018-10-30 ENCOUNTER — Other Ambulatory Visit: Payer: Self-pay | Admitting: Cardiovascular Disease

## 2018-10-30 DIAGNOSIS — I10 Essential (primary) hypertension: Secondary | ICD-10-CM

## 2018-10-30 DIAGNOSIS — E785 Hyperlipidemia, unspecified: Secondary | ICD-10-CM

## 2018-11-13 ENCOUNTER — Other Ambulatory Visit: Payer: Self-pay | Admitting: Cardiovascular Disease

## 2018-11-13 DIAGNOSIS — I10 Essential (primary) hypertension: Secondary | ICD-10-CM

## 2018-11-13 DIAGNOSIS — E785 Hyperlipidemia, unspecified: Secondary | ICD-10-CM

## 2018-12-09 ENCOUNTER — Other Ambulatory Visit: Payer: Self-pay | Admitting: Cardiovascular Disease

## 2018-12-09 DIAGNOSIS — E785 Hyperlipidemia, unspecified: Secondary | ICD-10-CM

## 2018-12-09 DIAGNOSIS — I10 Essential (primary) hypertension: Secondary | ICD-10-CM

## 2018-12-09 NOTE — Telephone Encounter (Signed)
Lisinopril 10 mg and lopressor 25 mg refilled.

## 2018-12-16 ENCOUNTER — Other Ambulatory Visit: Payer: Self-pay

## 2018-12-18 ENCOUNTER — Other Ambulatory Visit: Payer: Self-pay

## 2018-12-31 ENCOUNTER — Telehealth: Payer: Self-pay | Admitting: Cardiovascular Disease

## 2018-12-31 NOTE — Telephone Encounter (Signed)
Home phone may disconnected/ LVM for pre reg on significant other's phone.

## 2019-01-01 ENCOUNTER — Telehealth: Payer: Self-pay

## 2019-01-01 ENCOUNTER — Telehealth (INDEPENDENT_AMBULATORY_CARE_PROVIDER_SITE_OTHER): Payer: Medicare Other | Admitting: Cardiovascular Disease

## 2019-01-01 DIAGNOSIS — I10 Essential (primary) hypertension: Secondary | ICD-10-CM

## 2019-01-01 DIAGNOSIS — E785 Hyperlipidemia, unspecified: Secondary | ICD-10-CM

## 2019-01-01 DIAGNOSIS — G4733 Obstructive sleep apnea (adult) (pediatric): Secondary | ICD-10-CM

## 2019-01-01 DIAGNOSIS — I25810 Atherosclerosis of coronary artery bypass graft(s) without angina pectoris: Secondary | ICD-10-CM

## 2019-01-01 NOTE — Telephone Encounter (Signed)
lmtcb to review AVS AVS to be released to [employer]

## 2019-01-01 NOTE — Progress Notes (Signed)
Virtual Visit via Telephone Note   This visit type was conducted due to national recommendations for restrictions regarding the COVID-19 Pandemic (e.g. social distancing) in an effort to limit this patient's exposure and mitigate transmission in our community.  Due to her co-morbid illnesses, this patient is at least at moderate risk for complications without adequate follow up.  This format is felt to be most appropriate for this patient at this time.  The patient did not have access to video technology/had technical difficulties with video requiring transitioning to audio format only (telephone).  All issues noted in this document were discussed and addressed.  No physical exam could be performed with this format.  Please refer to the patient's chart for her  consent to telehealth for [facility].   Date:  01/01/2019   ID:  [name], DOB [birth year], MRN [id number]  Patient Location: Home Provider Location: Home  PCP:  Katherina Mires, MD  Cardiologist: Dr. Quay Burow Electrophysiologist:  None   Evaluation Performed:  Follow-Up Visit  Chief Complaint: Follow-up hypertension and hyperlipidemia as well as CAD.  History of Present Illness:    [name] is a 58 year old severely overweight divorced Caucasian female mother of 2, grandmother and 3 grandchildren who I last saw in the office  02/16/2017. Her mother, [name] , as also patient of mine. She is currently in school getting her social workers degree an [place].She has a history of RCA stenting by Dr. Dr. Doylene Canard in the past. She had coronary artery bypass grafting November 2008 with LIMA to LAD, vein to ramus branch and PDA. She recently stopped smoking 4 months ago. Her palms include hypertension, hyperlipidemia and obstructive sleep apnea intolerant to see. I catheterized her 03/04/10 revealing patent grafts and normal LV function. Her last Myoview performed 04/18/11 with nonischemic. She was experiencing a lot of  stress in her life and developed chest pain. She saw Truitt Merle, nurse practitioner on 02/11/16. At that time, she was up for cardiac catheterization which was performed 4 days later by Dr. Peter Martinique. He demonstrated widely patent grafts, normal LV function and unchanged anatomy compared to her cath performed in 2011.  Since I saw her 2 years ago she is remained stable.  She does smoke on occasion.  She does not have a blood pressure cuff at home.  She denies chest pain or shortness of breath.  She does have obstructive sleep apnea but does not wears CPAP.   The patient does not have symptoms concerning for COVID-19 infection (fever, chills, cough, or new shortness of breath).    Past Medical History:  Diagnosis Date   Anemia    takes iron supplement   Arthritis    knees, shoulders   Bipolar 1 disorder (HCC)    Cataract, immature    CHF (congestive heart failure) (HCC)    COPD (chronic obstructive pulmonary disease) (HCC)    Coronary artery disease    triple vessel   Family history of adverse reaction to anesthesia    states father "did not do well" with anesthesia; died last time he had surgery; also had heart disease   GERD (gastroesophageal reflux disease)    occasional - no current med.   Glaucoma    Hamm[place]oe 05/2015   right second toe   High cholesterol    History of MI (myocardial infarction)    Hypertension    states under control with meds., has been on med. x 8 yr.   Panic  attacks    Peripheral vascular disease (HCC)    carotid   Renal cyst, left    Schizophrenia (HCC)    Short of breath on exertion    Sleep apnea    no CPAP use   Urinary, incontinence, stress female    Wears dentures    upper   Wears partial dentures    lower   Past Surgical History:  Procedure Laterality Date   C-SECTIONS X [place]  12/10/2006; 12/18/2006; 07/10/2007; 07/19/2009   CARDIAC CATHETERIZATION  03/04/2010   patent  grafts, normal LV function, diffusely diseased RCA (Dr. Adora Fridge)   CARDIAC CATHETERIZATION N/A 02/15/2016   Procedure: Left Heart Cath and Cors/Grafts Angiography;  Surgeon: Peter M Martinique, MD;  Location: [facility];  Service: Cardiovascular;  Laterality: N/A;   CLUB FOOT RELEASE Bilateral    1st and 2nd toes   CORONARY ARTERY BYPASS GRAFT  07/16/2007   LIMA to LAD, VG to ramus, VG to PDA    FLEXIBLE BRONCHOSCOPY  07/16/2007   HAMMERTOE RECONSTRUCTION WITH WEIL OSTEOTOMY Right 06/03/2015   Procedure: RIGHT SECOND HAMMERTOE CORRECTION WITH MT WEIL OSTEOTOMY;  Surgeon: Wylene Simmer, MD;  Location: [facility];  Service: Orthopedics;  Laterality: Right;   HAND SURGERY Bilateral    1st and 2nd fingers - release of clubbing   KNEE ARTHROSCOPY WITH ANTERIOR CRUCIATE LIGAMENT (ACL) REPAIR Right 01/30/2008   [place] ABLATION  12/19/2007   with removal of IUD   ORIF FOOT FRACTURE Left    TOE SURGERIES TO REMOVE NAILS DUE TO CLUBBING     TOTAL KNEE ARTHROPLASTY Right 10/21/2012   Procedure: TOTAL KNEE ARTHROPLASTY;  Surgeon: Mauri Pole, MD;  Location: [facility];  Service: Orthopedics;  Laterality: Right;     No outpatient medications have been marked as taking for the 01/01/19 encounter (Appointment) with Lorretta Harp, MD.     Allergies:   Codeine; Haldol [haloperidol decanoate]; Amoxicillin; Bee venom; and Benadryl [diphenhydramine hcl]   Social History   Tobacco Use   Smoking status: Former Smoker    Packs/day: 0.00    Years: 0.00    Pack years: 0.00    Last attempt to quit: 05/21/2015    Years since quitting: 3.6   Smokeless tobacco: Never Used  Substance Use Topics   Alcohol use: No   Drug use: No     Family Hx: The patient's family history includes Anesthesia problems in her father; Colon cancer in her sister; Heart disease in her father.  ROS:   Please see the history of present illness.     All other systems reviewed and are  negative.   Prior CV studies:   The following studies were reviewed today:  None  Labs/Other Tests and Data Reviewed:    EKG:  No ECG reviewed.  Recent Labs: No results found for requested labs within last 8760 hours.   Recent Lipid Panel Lab Results  Component Value Date/Time   CHOL  07/17/2009 03:38 AM    193        ATP III CLASSIFICATION:  <200     mg/dL   Desirable  200-239  mg/dL   Borderline High  >=240    mg/dL   High          TRIG 318 (H) 07/17/2009 03:38 AM   HDL 34 (L) 07/17/2009 03:38 AM   CHOLHDL 5.7 07/17/2009 03:38 AM   LDLCALC  07/17/2009 03:38 AM  95        Total Cholesterol/HDL:CHD Risk Coronary Heart Disease Risk Table                     Men   Women  1/2 Average Risk   3.4   3.3  Average Risk       5.0   4.4  2 X Average Risk   9.6   7.1  3 X Average Risk  23.4   11.0        Use the calculated Patient Ratio above and the CHD Risk Table to determine the patient's CHD Risk.        ATP III CLASSIFICATION (LDL):  <100     mg/dL   Optimal  100-129  mg/dL   Near or Above                    Optimal  130-159  mg/dL   Borderline  160-189  mg/dL   High  >190     mg/dL   Very High    Wt Readings from Last 3 Encounters:  02/16/17 177 lb 9.6 oz (80.6 kg)  04/07/16 197 lb 12.8 oz (89.7 kg)  02/15/16 189 lb (85.7 kg)     Objective:    Vital Signs:  LMP 11/16/2011    VITAL SIGNS:  reviewed the patient does not have a blood pressure cuff at home.  A full physical exam was not performed since this was a virtual telemedicine phone visit.  ASSESSMENT & PLAN:    1. Coronary artery disease- history of CAD status post remote stenting of her RCA by Dr. Doylene Canard ultimately undergoing CABG 11/08 with a LIMA to her LAD, vein to the ramus branch and PDA.  I performed cardiac catheterization on her 03/04/2010 revealing patent grafts.  Dr. Martinique performed cardiac cath on her 02/11/2016 again revealing similar anatomy.  She has been asymptomatic  since. 2. Essential hypertension- history of essential hypertension unable to check her blood pressure at home on lisinopril and metoprolol 3. Hyperlipidemia- history of hyperlipidemia on atorvastatin, niacin and Lovaza.  She is concerned that her insurance company is not covering her Lovaza.  We will research an acceptable alternative. 4. Obstructive sleep apnea- not on CPAP 5. Tobacco abuse-smokes occasionally  COVID-19 Education: The signs and symptoms of COVID-19 were discussed with the patient and how to seek care for testing (follow up with PCP or arrange E-visit).  The importance of social distancing was discussed today.  Time:   Today, I have spent 6 minutes with the patient with telehealth technology discussing the above problems.     Medication Adjustments/Labs and Tests Ordered: Current medicines are reviewed at length with the patient today.  Concerns regarding medicines are outlined above.   Tests Ordered: No orders of the defined types were placed in this encounter.   Medication Changes: No orders of the defined types were placed in this encounter.   Disposition:  Follow up in 1 year(s)  Signed, Quay Burow, MD  01/01/2019 7:30 AM    [facility]

## 2019-01-01 NOTE — Patient Instructions (Signed)
Medication Instructions:  Your physician recommends that you continue on your current medications as directed. Please refer to the Current Medication list given to you today.  If you need a refill on your cardiac medications before your next appointment, please call your pharmacy.   Lab work: Your physician recommends that you return for lab work within 1-2 weeks. FASTING LIPID PROFILE AND LIVER FUNCTION TEST. YOU WILL RECEIVE A LAB SLIP IN THE MAIL. PLEASE DO NOT EAT OR DRINK (EXCEPT WATER) ANYTHING AFTER MIDNIGHT ON THE DAY YOU CHOOSE TO PRESENT FOR LAB WORK. YOU MAY EAT AFTER YOUR BLOOD HAS BEEN COLLECTED. NO APPOINTMENT IS NEEDED.   If you have labs (blood work) drawn today and your tests are completely normal, you will receive your results only by: Marland Kitchen MyChart Message (if you have MyChart) OR . A paper copy in the mail If you have any lab test that is abnormal or we need to change your treatment, we will call you to review the results.  Testing/Procedures: NONE  Follow-Up: At [facility], you and your health needs are our priority.  As part of our continuing mission to provide you with exceptional heart care, we have created designated Provider Care Teams.  These Care Teams include your primary Cardiologist (physician) and Advanced Practice Providers (APPs -  Physician Assistants and Nurse Practitioners) who all work together to provide you with the care you need, when you need it. You will need a follow up appointment in 12 months WITH DR. Gwenlyn Found.  Please call our office 2 months in advance to schedule this appointment.

## 2019-01-03 ENCOUNTER — Other Ambulatory Visit: Payer: Self-pay | Admitting: Cardiovascular Disease

## 2019-01-03 ENCOUNTER — Telehealth: Payer: Self-pay | Admitting: Cardiovascular Disease

## 2019-01-03 ENCOUNTER — Other Ambulatory Visit: Payer: Self-pay

## 2019-01-03 MED ORDER — OMEGA-3-ACID ETHYL ESTERS 1 G PO CAPS: 1.0000 | ORAL_CAPSULE | Freq: Two times a day (BID) | ORAL | 11 refills | Status: DC

## 2019-01-03 NOTE — Telephone Encounter (Signed)
Routed to [facility] to review med for COPD contraindications and advise

## 2019-01-03 NOTE — Telephone Encounter (Signed)
°*  STAT* If patient is at the pharmacy, call can be transferred to refill team.   1. Which medications need to be refilled? (please list name of each medication and dose if known)   omega-3 acid ethyl esters (LOVAZA) 1 g capsule  2. Which pharmacy/location (including street and city if local pharmacy) is medication to be sent to? [id number] - [place], [place] - [address]  3. Do they need a 30 day or 90 day supply? 30   Insurance will not pay for current rx

## 2019-01-03 NOTE — Telephone Encounter (Signed)
Pt called and reported to [place] that her PCP told her with her COPD, she should not be on lisinopril. She was hoping there is another medication Dr. Gwenlyn Found could put her on

## 2019-01-03 NOTE — Telephone Encounter (Signed)
lovaza refilled.

## 2019-01-04 ENCOUNTER — Other Ambulatory Visit: Payer: Self-pay | Admitting: Cardiovascular Disease

## 2019-01-04 DIAGNOSIS — I10 Essential (primary) hypertension: Secondary | ICD-10-CM

## 2019-01-04 DIAGNOSIS — E785 Hyperlipidemia, unspecified: Secondary | ICD-10-CM

## 2019-01-06 NOTE — Telephone Encounter (Signed)
LMOM; patient to call back to discuss potential medication change

## 2019-01-07 NOTE — Telephone Encounter (Signed)
3rd message left for patient to call back and discuss lisinopril therapy.

## 2019-01-29 ENCOUNTER — Other Ambulatory Visit: Payer: Self-pay

## 2019-01-29 MED ORDER — OMEGA-3-ACID ETHYL ESTERS 1 G PO CAPS: 1.0000 | ORAL_CAPSULE | Freq: Two times a day (BID) | ORAL | 11 refills | Status: DC

## 2019-02-07 ENCOUNTER — Other Ambulatory Visit: Payer: Self-pay | Admitting: Cardiovascular Disease

## 2019-02-07 DIAGNOSIS — E785 Hyperlipidemia, unspecified: Secondary | ICD-10-CM

## 2019-02-07 DIAGNOSIS — I10 Essential (primary) hypertension: Secondary | ICD-10-CM

## 2019-06-20 ENCOUNTER — Other Ambulatory Visit: Payer: Self-pay

## 2019-06-20 ENCOUNTER — Encounter (HOSPITAL_COMMUNITY): Payer: Self-pay | Admitting: Emergency Medicine

## 2019-06-20 ENCOUNTER — Emergency Department (HOSPITAL_COMMUNITY): Payer: Medicare Other

## 2019-06-20 ENCOUNTER — Inpatient Hospital Stay (HOSPITAL_COMMUNITY): Payer: Medicare Other

## 2019-06-20 ENCOUNTER — Inpatient Hospital Stay (HOSPITAL_COMMUNITY)
Admission: EM | Admit: 2019-06-20 | Discharge: 2019-06-25 | DRG: 247 | Disposition: A | Discharge status: Home / Self Care | Payer: Medicare Other | Attending: Cardiovascular Disease | Admitting: Cardiovascular Disease

## 2019-06-20 DIAGNOSIS — Z87891 Personal history of nicotine dependence: Secondary | ICD-10-CM | POA: Diagnosis not present

## 2019-06-20 DIAGNOSIS — G4733 Obstructive sleep apnea (adult) (pediatric): Secondary | ICD-10-CM | POA: Diagnosis present

## 2019-06-20 DIAGNOSIS — Z888 Allergy status to other drugs, medicaments and biological substances status: Secondary | ICD-10-CM | POA: Diagnosis not present

## 2019-06-20 DIAGNOSIS — I249 Acute ischemic heart disease, unspecified: Secondary | ICD-10-CM | POA: Diagnosis not present

## 2019-06-20 DIAGNOSIS — Z7982 Long term (current) use of aspirin: Secondary | ICD-10-CM | POA: Diagnosis not present

## 2019-06-20 DIAGNOSIS — E7801 Familial hypercholesterolemia: Secondary | ICD-10-CM | POA: Diagnosis not present

## 2019-06-20 DIAGNOSIS — H409 Unspecified glaucoma: Secondary | ICD-10-CM | POA: Diagnosis present

## 2019-06-20 DIAGNOSIS — Z951 Presence of aortocoronary bypass graft: Secondary | ICD-10-CM

## 2019-06-20 DIAGNOSIS — I509 Heart failure, unspecified: Secondary | ICD-10-CM | POA: Diagnosis present

## 2019-06-20 DIAGNOSIS — Z91030 Bee allergy status: Secondary | ICD-10-CM

## 2019-06-20 DIAGNOSIS — H269 Unspecified cataract: Secondary | ICD-10-CM | POA: Diagnosis present

## 2019-06-20 DIAGNOSIS — F209 Schizophrenia, unspecified: Secondary | ICD-10-CM | POA: Diagnosis present

## 2019-06-20 DIAGNOSIS — Z20828 Contact with and (suspected) exposure to other viral communicable diseases: Secondary | ICD-10-CM | POA: Diagnosis present

## 2019-06-20 DIAGNOSIS — I110 Hypertensive heart disease with heart failure: Secondary | ICD-10-CM | POA: Diagnosis present

## 2019-06-20 DIAGNOSIS — F410 Panic disorder [episodic paroxysmal anxiety] without agoraphobia: Secondary | ICD-10-CM | POA: Diagnosis present

## 2019-06-20 DIAGNOSIS — J449 Chronic obstructive pulmonary disease, unspecified: Secondary | ICD-10-CM | POA: Diagnosis present

## 2019-06-20 DIAGNOSIS — I25700 Atherosclerosis of coronary artery bypass graft(s), unspecified, with unstable angina pectoris: Secondary | ICD-10-CM | POA: Diagnosis not present

## 2019-06-20 DIAGNOSIS — Z955 Presence of coronary angioplasty implant and graft: Secondary | ICD-10-CM

## 2019-06-20 DIAGNOSIS — I214 Non-ST elevation (NSTEMI) myocardial infarction: Secondary | ICD-10-CM | POA: Diagnosis not present

## 2019-06-20 DIAGNOSIS — Z9181 History of falling: Secondary | ICD-10-CM

## 2019-06-20 DIAGNOSIS — I340 Nonrheumatic mitral (valve) insufficiency: Secondary | ICD-10-CM | POA: Diagnosis not present

## 2019-06-20 DIAGNOSIS — R519 Headache, unspecified: Secondary | ICD-10-CM | POA: Diagnosis present

## 2019-06-20 DIAGNOSIS — R001 Bradycardia, unspecified: Secondary | ICD-10-CM | POA: Diagnosis present

## 2019-06-20 DIAGNOSIS — R0602 Shortness of breath: Secondary | ICD-10-CM | POA: Diagnosis not present

## 2019-06-20 DIAGNOSIS — E785 Hyperlipidemia, unspecified: Secondary | ICD-10-CM | POA: Diagnosis not present

## 2019-06-20 DIAGNOSIS — Z885 Allergy status to narcotic agent status: Secondary | ICD-10-CM | POA: Diagnosis not present

## 2019-06-20 DIAGNOSIS — Z6830 Body mass index (BMI) 30.0-30.9, adult: Secondary | ICD-10-CM

## 2019-06-20 DIAGNOSIS — Z96651 Presence of right artificial knee joint: Secondary | ICD-10-CM | POA: Diagnosis present

## 2019-06-20 DIAGNOSIS — Z880 Allergy status to penicillin: Secondary | ICD-10-CM

## 2019-06-20 DIAGNOSIS — Z8249 Family history of ischemic heart disease and other diseases of the circulatory system: Secondary | ICD-10-CM

## 2019-06-20 DIAGNOSIS — E669 Obesity, unspecified: Secondary | ICD-10-CM | POA: Diagnosis present

## 2019-06-20 DIAGNOSIS — R079 Chest pain, unspecified: Secondary | ICD-10-CM

## 2019-06-20 DIAGNOSIS — I951 Orthostatic hypotension: Secondary | ICD-10-CM | POA: Diagnosis present

## 2019-06-20 DIAGNOSIS — I252 Old myocardial infarction: Secondary | ICD-10-CM | POA: Diagnosis not present

## 2019-06-20 DIAGNOSIS — I25110 Atherosclerotic heart disease of native coronary artery with unstable angina pectoris: Secondary | ICD-10-CM | POA: Diagnosis not present

## 2019-06-20 DIAGNOSIS — K219 Gastro-esophageal reflux disease without esophagitis: Secondary | ICD-10-CM | POA: Diagnosis present

## 2019-06-20 DIAGNOSIS — I499 Cardiac arrhythmia, unspecified: Secondary | ICD-10-CM | POA: Diagnosis not present

## 2019-06-20 DIAGNOSIS — R0789 Other chest pain: Secondary | ICD-10-CM | POA: Diagnosis not present

## 2019-06-20 DIAGNOSIS — I440 Atrioventricular block, first degree: Secondary | ICD-10-CM | POA: Diagnosis present

## 2019-06-20 DIAGNOSIS — I25710 Atherosclerosis of autologous vein coronary artery bypass graft(s) with unstable angina pectoris: Secondary | ICD-10-CM | POA: Diagnosis not present

## 2019-06-20 DIAGNOSIS — Z800 Family history of malignant neoplasm of digestive organs: Secondary | ICD-10-CM

## 2019-06-20 DIAGNOSIS — Z79899 Other long term (current) drug therapy: Secondary | ICD-10-CM

## 2019-06-20 DIAGNOSIS — E7800 Pure hypercholesterolemia, unspecified: Secondary | ICD-10-CM | POA: Diagnosis present

## 2019-06-20 DIAGNOSIS — R0902 Hypoxemia: Secondary | ICD-10-CM | POA: Diagnosis not present

## 2019-06-20 DIAGNOSIS — E782 Mixed hyperlipidemia: Secondary | ICD-10-CM | POA: Diagnosis not present

## 2019-06-20 DIAGNOSIS — F319 Bipolar disorder, unspecified: Secondary | ICD-10-CM | POA: Diagnosis present

## 2019-06-20 LAB — BASIC METABOLIC PANEL
Anion gap: 15 (ref 5–15)
BUN: 16 mg/dL (ref 6–20)
CO2: 20 mmol/L — ABNORMAL LOW (ref 22–32)
Calcium: 9.5 mg/dL (ref 8.9–10.3)
Chloride: 105 mmol/L (ref 98–111)
Creatinine, Ser: 1.07 mg/dL — ABNORMAL HIGH (ref 0.44–1.00)
GFR calc Af Amer: >60 mL/min (ref >60)
GFR calc non Af Amer: 58 mL/min — ABNORMAL LOW (ref >60)
Glucose, Bld: 154 mg/dL — ABNORMAL HIGH (ref 70–99)
Potassium: 4.1 mmol/L (ref 3.5–5.1)
Sodium: 140 mmol/L (ref 135–145)

## 2019-06-20 LAB — ECHOCARDIOGRAM COMPLETE
Height: 65 in
Weight: 2944 oz

## 2019-06-20 LAB — CBC
HCT: 43.5 % (ref 36.0–46.0)
Hemoglobin: 14.8 g/dL (ref 12.0–15.0)
MCH: 33 pg (ref 26.0–34.0)
MCHC: 34 g/dL (ref 30.0–36.0)
MCV: 97.1 fL (ref 80.0–100.0)
Platelets: 271 10*3/uL (ref 150–400)
RBC: 4.48 MIL/uL (ref 3.87–5.11)
RDW: 14.3 % (ref 11.5–15.5)
WBC: 15.4 10*3/uL — ABNORMAL HIGH (ref 4.0–10.5)
nRBC: 0 % (ref 0.0–0.2)

## 2019-06-20 LAB — I-STAT BETA HCG BLOOD, ED (MC, WL, AP ONLY): I-stat hCG, quantitative: 10.6 m[IU]/mL — ABNORMAL HIGH (ref <5)

## 2019-06-20 LAB — CBG MONITORING, ED: Glucose-Capillary: 143 mg/dL — ABNORMAL HIGH (ref 70–99)

## 2019-06-20 LAB — TROPONIN I (HIGH SENSITIVITY)
Troponin I (High Sensitivity): 24 ng/L — ABNORMAL HIGH (ref <18)
Troponin I (High Sensitivity): 28 ng/L — ABNORMAL HIGH (ref <18)

## 2019-06-20 LAB — HEPARIN LEVEL (UNFRACTIONATED): Heparin Unfractionated: 0.44 IU/mL (ref 0.30–0.70)

## 2019-06-20 LAB — HIV ANTIBODY (ROUTINE TESTING W REFLEX): HIV Screen 4th Generation wRfx: NONREACTIVE

## 2019-06-20 LAB — SARS CORONAVIRUS 2 (TAT 6-24 HRS): SARS Coronavirus 2: NEGATIVE

## 2019-06-20 MED ORDER — QUETIAPINE FUMARATE 50 MG PO TABS
150.0000 mg | ORAL_TABLET | Freq: Every day | ORAL | Status: DC
  Administered 2019-06-20: 100 mg via ORAL
  Administered 2019-06-21 – 2019-06-24 (×4): 150 mg via ORAL
  Filled 2019-06-20 (×3): qty 1
  Filled 2019-06-20: qty 3
  Filled 2019-06-20 (×3): qty 1

## 2019-06-20 MED ORDER — METOPROLOL TARTRATE 12.5 MG HALF TABLET
12.5000 mg | ORAL_TABLET | Freq: Two times a day (BID) | ORAL | Status: DC
  Administered 2019-06-20 – 2019-06-25 (×9): 12.5 mg via ORAL
  Filled 2019-06-20 (×9): qty 1

## 2019-06-20 MED ORDER — ASPIRIN 81 MG PO CHEW
81.0000 mg | CHEWABLE_TABLET | Freq: Every day | ORAL | Status: DC
  Administered 2019-06-21 – 2019-06-25 (×4): 81 mg via ORAL
  Filled 2019-06-20 (×4): qty 1

## 2019-06-20 MED ORDER — ASPIRIN 81 MG PO CHEW: 324.0000 mg | CHEWABLE_TABLET | Freq: Once | ORAL | Status: DC

## 2019-06-20 MED ORDER — ATORVASTATIN CALCIUM 80 MG PO TABS
80.0000 mg | ORAL_TABLET | Freq: Every day | ORAL | Status: DC
  Administered 2019-06-20 – 2019-06-24 (×5): 80 mg via ORAL
  Filled 2019-06-20 (×5): qty 1

## 2019-06-20 MED ORDER — OMEGA-3-ACID ETHYL ESTERS 1 G PO CAPS
1.0000 | ORAL_CAPSULE | Freq: Two times a day (BID) | ORAL | Status: DC
  Administered 2019-06-20 – 2019-06-25 (×9): 1 g via ORAL
  Filled 2019-06-20 (×9): qty 1

## 2019-06-20 MED ORDER — ALBUTEROL SULFATE HFA 108 (90 BASE) MCG/ACT IN AERS
2.0000 | INHALATION_SPRAY | Freq: Four times a day (QID) | RESPIRATORY_TRACT | Status: DC | PRN
  Filled 2019-06-20: qty 6.7

## 2019-06-20 MED ORDER — FERROUS SULFATE 325 (65 FE) MG PO TABS: 325.0000 mg | ORAL_TABLET | Freq: Every day | ORAL | Status: DC

## 2019-06-20 MED ORDER — HEPARIN (PORCINE) 25000 UT/250ML-% IV SOLN
850.0000 [IU]/h | INTRAVENOUS | Status: DC
  Administered 2019-06-20: 900 [IU]/h via INTRAVENOUS
  Administered 2019-06-21: 750 [IU]/h via INTRAVENOUS
  Administered 2019-06-22: 850 [IU]/h via INTRAVENOUS
  Filled 2019-06-20 (×3): qty 250

## 2019-06-20 MED ORDER — ONDANSETRON HCL 4 MG/2ML IJ SOLN: 4.0000 mg | Freq: Four times a day (QID) | INTRAMUSCULAR | Status: DC | PRN

## 2019-06-20 MED ORDER — FLUTICASONE FUROATE-VILANTEROL 200-25 MCG/INH IN AEPB
1.0000 | INHALATION_SPRAY | Freq: Every day | RESPIRATORY_TRACT | Status: DC
  Administered 2019-06-21 – 2019-06-25 (×4): 1 via RESPIRATORY_TRACT
  Filled 2019-06-20: qty 28

## 2019-06-20 MED ORDER — NITROGLYCERIN 2 % TD OINT
0.5000 [in_us] | TOPICAL_OINTMENT | Freq: Four times a day (QID) | TRANSDERMAL | Status: DC | PRN
  Filled 2019-06-20: qty 30

## 2019-06-20 MED ORDER — NIACIN ER (ANTIHYPERLIPIDEMIC) 500 MG PO TBCR
1000.0000 mg | EXTENDED_RELEASE_TABLET | Freq: Every day | ORAL | Status: DC
  Administered 2019-06-20 – 2019-06-24 (×5): 1000 mg via ORAL
  Filled 2019-06-20 (×7): qty 2

## 2019-06-20 MED ORDER — NITROGLYCERIN 0.4 MG SL SUBL
0.4000 mg | SUBLINGUAL_TABLET | SUBLINGUAL | Status: DC | PRN
  Administered 2019-06-20: 0.4 mg via SUBLINGUAL
  Filled 2019-06-20: qty 1

## 2019-06-20 MED ORDER — BRIMONIDINE TARTRATE 0.15 % OP SOLN
1.0000 [drp] | Freq: Two times a day (BID) | OPHTHALMIC | Status: DC
  Administered 2019-06-20 – 2019-06-24 (×8): 1 [drp] via OPHTHALMIC
  Filled 2019-06-20: qty 5

## 2019-06-20 MED ORDER — PROCHLORPERAZINE EDISYLATE 10 MG/2ML IJ SOLN
5.0000 mg | Freq: Once | INTRAMUSCULAR | Status: AC
  Administered 2019-06-20: 5 mg via INTRAVENOUS
  Filled 2019-06-20: qty 2

## 2019-06-20 MED ORDER — ACETAMINOPHEN 325 MG PO TABS
650.0000 mg | ORAL_TABLET | ORAL | Status: DC | PRN
  Administered 2019-06-22 – 2019-06-24 (×6): 650 mg via ORAL
  Filled 2019-06-20 (×6): qty 2

## 2019-06-20 MED ORDER — LAMOTRIGINE 150 MG PO TABS
150.0000 mg | ORAL_TABLET | Freq: Every day | ORAL | Status: DC
  Administered 2019-06-20 – 2019-06-25 (×5): 150 mg via ORAL
  Filled 2019-06-20 (×6): qty 1

## 2019-06-20 MED ORDER — HEPARIN BOLUS VIA INFUSION
3500.0000 [IU] | Freq: Once | INTRAVENOUS | Status: AC
  Administered 2019-06-20: 3500 [IU] via INTRAVENOUS
  Filled 2019-06-20: qty 3500

## 2019-06-20 NOTE — ED Provider Notes (Signed)
[facility]ERG[facility]Y DEPARTMENT Provider Note   CSN: [id number] Arrival date & time: 06/20/19  1039     History   Chief Complaint Chief Complaint  Patient presents with  . Chest Pain    HPI [name] is a 58 y.o. female.     58 yo F with a significant past medical history of through a bypass comes in with a chief complaint of chest pain.  States is left-sided and its worse on exertion and better with rest.  Feels like a pressure and radiates down the left arm.  Having some shortness of breath with it.  Feels like the last time she felt she had to have an intervention done.  Going on about 4 to 5 days.  She had a syncopal events about 4 months ago.  Has been complaining of some posterior headache since then.  Denies cough congestion or fever.  Denies abdominal pain.  The history is provided by the patient.  Chest Pain Pain location:  L chest Pain quality: pressure   Pain radiates to:  L arm Pain severity:  Severe Onset quality:  Gradual Duration:  5 days Timing:  Constant Progression:  Worsening Chronicity:  New Relieved by:  Rest Worsened by:  Exertion Ineffective treatments:  None tried Associated symptoms: fatigue and shortness of breath   Associated symptoms: no dizziness, no fever, no headache, no nausea, no palpitations and no vomiting     Past Medical History:  Diagnosis Date  . Anemia    takes iron supplement  . Arthritis    knees, shoulders  . Bipolar 1 disorder ([place])   . Cataract, immature   . CHF (congestive heart failure) ([place])   . COPD (chronic obstructive pulmonary disease) ([place])   . Coronary artery disease    triple vessel  . Family history of adverse reaction to anesthesia    states father "did not do well" with anesthesia; died last time he had surgery; also had heart disease  . GERD (gastroesophageal reflux disease)    occasional - no current med.  . Glaucoma   . Hammertoe 05/2015   right second toe  . High  cholesterol   . History of MI (myocardial infarction)   . Hypertension    states under control with meds., has been on med. x 8 yr.  . Panic attacks   . Peripheral vascular disease ([place])    carotid  . Renal cyst, left   . Schizophrenia ([place])   . Short of breath on exertion   . Sleep apnea    no CPAP use  . Urinary, incontinence, stress female   . Wears dentures    upper  . Wears partial dentures    lower    Patient Active Problem List   Diagnosis Date Noted  . ACS (acute coronary syndrome) ([place]) 06/20/2019  . Unstable angina ([place]) 02/15/2016  . Essential hypertension 03/17/2015  . Hyperlipidemia LDL goal <70 12/24/2013  . Coronary artery disease involving coronary bypass graft 12/24/2013  . Expected blood loss anemia 10/22/2012  . Obesity 10/22/2012  . S/P right TKA 10/21/2012    Past Surgical History:  Procedure Laterality Date  . C-SECTIONS X 2  1985, 1988  . CARDIAC CATHETERIZATION  12/10/2006; 12/18/2006; 07/10/2007; 07/19/2009  . CARDIAC CATHETERIZATION  03/04/2010   patent grafts, normal LV function, diffusely diseased RCA (Dr. Adora Fridge)  . CARDIAC CATHETERIZATION N/A 02/15/2016   Procedure: Left Heart Cath and Cors/Grafts Angiography;  Surgeon: Peter M Martinique,  MD;  Location: [facility];  Service: Cardiovascular;  Laterality: N/A;  . CLUB FOOT RELEASE Bilateral    1st and 2nd toes  . CORONARY ARTERY BYPASS GRAFT  07/16/2007   LIMA to LAD, VG to ramus, VG to PDA   . FLEXIBLE BRONCHOSCOPY  07/16/2007  . HAMMERTOE RECONSTRUCTION WITH WEIL OSTEOTOMY Right 06/03/2015   Procedure: RIGHT SECOND HAMMERTOE CORRECTION WITH MT WEIL OSTEOTOMY;  Surgeon: Wylene Simmer, MD;  Location: [facility];  Service: Orthopedics;  Laterality: Right;  . HAND SURGERY Bilateral    1st and 2nd fingers - release of clubbing  . KNEE ARTHROSCOPY WITH ANTERIOR CRUCIATE LIGAMENT (ACL) REPAIR Right 01/30/2008  . NOVASURE ABLATION  12/19/2007   with removal of IUD  . ORIF FOOT  FRACTURE Left   . TOE SURGERIES TO REMOVE NAILS DUE TO CLUBBING    . TOTAL KNEE ARTHROPLASTY Right 10/21/2012   Procedure: TOTAL KNEE ARTHROPLASTY;  Surgeon: Mauri Pole, MD;  Location: [facility];  Service: Orthopedics;  Laterality: Right;     OB History   No obstetric history on file.      Home Medications    Prior to Admission medications   Medication Sig Start Date End Date Taking? Authorizing Provider  albuterol (PROVENTIL HFA;VENTOLIN HFA) 108 (90 BASE) MCG/ACT inhaler Inhale 2 puffs into the lungs every 6 (six) hours as needed for wheezing.    [provider]  aspirin 81 MG chewable tablet Chew 81 mg by mouth daily.    [provider]  atorvastatin (LIPITOR) 80 MG tablet Take 1 tablet (80 mg total) by mouth daily at 6 PM. Need to make an appt for overdue visit Patient not taking: Reported on 01/01/2019 08/19/18   Lorretta Harp, MD  brimonidine (ALPHAGAN) 0.15 % ophthalmic solution Place 1 drop into both eyes 2 (two) times daily. 2 drops left eye BID; 1 drop right eye BID    [provider]  ferrous sulfate 325 (65 FE) MG tablet Take 325 mg by mouth daily with breakfast.    [provider]  Fluticasone-Salmeterol (ADVAIR) 250-50 MCG/DOSE AEPB Inhale 1 puff into the lungs 2 (two) times daily.    [provider]  isosorbide mononitrate (IMDUR) 30 MG 24 hr tablet TAKE 1 TABLET (30 MG TOTAL) BY MOUTH DAILY. NEED OFFICE VISIT. 02/07/19   Lorretta Harp, MD  lamoTRIgine (LAMICTAL) 100 MG tablet Take 200 mg by mouth daily.     [provider]  lisinopril (ZESTRIL) 10 MG tablet Take 1 tablet (10 mg total) by mouth daily. 01/06/19   Lorretta Harp, MD  metoprolol tartrate (LOPRESSOR) 25 MG tablet Take 0.5 tablets (12.5 mg total) by mouth 2 (two) times daily. 01/06/19   Lorretta Harp, MD  niacin (NIASPAN) 1000 MG CR tablet TAKE 1 TABLET (1,000 MG TOTAL) BY MOUTH AT BEDTIME. 02/07/19   Lorretta Harp, MD  nitroGLYCERIN (NITROSTAT)  0.4 MG SL tablet Place 1 tablet (0.4 mg total) under the tongue every 5 (five) minutes as needed. 03/19/18   Lorretta Harp, MD  omega-3 acid ethyl esters (LOVAZA) 1 g capsule Take 1 capsule (1 g total) by mouth 2 (two) times daily. 01/29/19   Lorretta Harp, MD  QUEtiapine (SEROQUEL) 25 MG tablet Take 150 mg by mouth at bedtime. Reported on 02/11/2016 12/01/13   [provider]    Family History Family History  Problem Relation Age of Onset  . Heart disease Father   .  Anesthesia problems Father        states "did not do well" with anesthesia  . Colon cancer Sister     Social History Social History   Tobacco Use  . Smoking status: Former Smoker    Packs/day: 0.00    Years: 0.00    Pack years: 0.00    Quit date: 05/21/2015    Years since quitting: 4.0  . Smokeless tobacco: Never Used  . Tobacco comment: pt states she quit a week ago 06/14/19  Substance Use Topics  . Alcohol use: No  . Drug use: No     Allergies   Codeine, Haldol [haloperidol decanoate], Amoxicillin, Bee venom, and Benadryl [diphenhydramine hcl]   Review of Systems Review of Systems  Constitutional: Positive for fatigue. Negative for chills and fever.  HENT: Negative for congestion and rhinorrhea.   Eyes: Negative for redness and visual disturbance.  Respiratory: Positive for shortness of breath. Negative for wheezing.   Cardiovascular: Positive for chest pain. Negative for palpitations.  Gastrointestinal: Negative for nausea and vomiting.  Genitourinary: Negative for dysuria and urgency.  Musculoskeletal: Negative for arthralgias and myalgias.  Skin: Negative for pallor and wound.  Neurological: Negative for dizziness and headaches.     Physical Exam Updated Vital Signs BP (!) 143/91   Pulse (!) 59   Temp 98.4 F (36.9 C) (Oral)   Resp 17   Ht 5\' 5"  (1.651 m)   Wt 83.5 kg   LMP 11/16/2011   SpO2 96%   BMI 30.62 kg/m   Physical Exam Vitals signs and nursing note reviewed.   Constitutional:      General: She is not in acute distress.    Appearance: She is well-developed. She is not diaphoretic.  HENT:     Head: Normocephalic and atraumatic.  Eyes:     Pupils: Pupils are equal, round, and reactive to light.  Neck:     Musculoskeletal: Normal range of motion and neck supple.  Cardiovascular:     Rate and Rhythm: Normal rate and regular rhythm.     Heart sounds: No murmur. No friction rub. No gallop.   Pulmonary:     Effort: Pulmonary effort is normal.     Breath sounds: No wheezing or rales.  Chest:     Chest wall: No tenderness.  Abdominal:     General: There is no distension.     Palpations: Abdomen is soft.     Tenderness: There is no abdominal tenderness.  Musculoskeletal:        General: No tenderness.  Skin:    General: Skin is warm and dry.  Neurological:     Mental Status: She is alert and oriented to person, place, and time.  Psychiatric:        Behavior: Behavior normal.      ED Treatments / Results  Labs (all labs ordered are listed, but only abnormal results are displayed) Labs Reviewed  BASIC METABOLIC PANEL - Abnormal; Notable for the following components:      Result Value   CO2 20 (*)    Glucose, Bld 154 (*)    Creatinine, Ser 1.07 (*)    GFR calc non Af Amer 58 (*)    All other components within normal limits  CBC - Abnormal; Notable for the following components:   WBC 15.4 (*)    All other components within normal limits  CBG MONITORING, ED - Abnormal; Notable for the following components:   Glucose-Capillary 143 (*)  All other components within normal limits  I-STAT BETA HCG BLOOD, ED ([facility], [facility], [facility] ONLY) - Abnormal; Notable for the following components:   I-stat hCG, quantitative 10.6 (*)    All other components within normal limits  TROPONIN I (HIGH SENSITIVITY) - Abnormal; Notable for the following components:   Troponin I (High Sensitivity) 24 (*)    All other components within normal limits  SARS CORONAVIRUS 2  (TAT 6-24 HRS)  HIV ANTIBODY (ROUTINE TESTING W REFLEX)  TROPONIN I (HIGH SENSITIVITY)    EKG EKG Interpretation  Date/Time:  Friday June 20 2019 10:47:53 EDT Ventricular Rate:  61 PR Interval:    QRS Duration: 128 QT Interval:  480 QTC Calculation: 484 R Axis:   1 Text Interpretation: Sinus rhythm IVCD, consider atypical RBBB Left ventricular hypertrophy Inferior infarct, old No significant change since last tracing Confirmed by Deno Etienne [phone]) on 06/20/2019 10:49:34 AM   Radiology Dg Chest Portable 1 View  Result Date: 06/20/2019 CLINICAL DATA:  Chest pain EXAM: PORTABLE CHEST 1 VIEW COMPARISON:  02/11/2016 FINDINGS: Cardiomegaly status post median sternotomy and CABG with numerous fractured sternotomy wires. Both lungs are clear. The visualized skeletal structures are unremarkable. IMPRESSION: 1. Cardiomegaly without acute abnormality of the lungs in AP portable projection. 2. Numerous fractured sternotomy wires, which may be symptomatic although unchanged compared to prior examination. Electronically Signed   By: Eddie Candle M.D.   On: 06/20/2019 11:17    Procedures Procedures (including critical care time)  Medications Ordered in ED Medications  atorvastatin (LIPITOR) tablet 80 mg (has no administration in time range)  aspirin chewable tablet 81 mg (has no administration in time range)  QUEtiapine (SEROQUEL) tablet 150 mg (has no administration in time range)  ferrous sulfate tablet 325 mg (has no administration in time range)  lamoTRIgine (LAMICTAL) tablet 200 mg (has no administration in time range)  albuterol (VENTOLIN HFA) 108 (90 Base) MCG/ACT inhaler 2 puff (has no administration in time range)  fluticasone furoate-vilanterol (BREO ELLIPTA) 200-25 MCG/INH 1 puff (has no administration in time range)  brimonidine (ALPHAGAN) 0.15 % ophthalmic solution 1 drop (has no administration in time range)  niacin (NIASPAN) CR tablet 1,000 mg (has no administration in time  range)  omega-3 acid ethyl esters (LOVAZA) capsule 1 g (has no administration in time range)  nitroGLYCERIN (NITROSTAT) SL tablet 0.4 mg (has no administration in time range)  acetaminophen (TYLENOL) tablet 650 mg (has no administration in time range)  ondansetron (ZOFRAN) injection 4 mg (has no administration in time range)  metoprolol tartrate (LOPRESSOR) tablet 12.5 mg (has no administration in time range)  nitroGLYCERIN (NITROGLYN) 2 % ointment 0.5 inch (has no administration in time range)  prochlorperazine (COMPAZINE) injection 5 mg (5 mg Intravenous Given 06/20/19 1126)     Initial Impression / Assessment and Plan / ED Course  I have reviewed the triage vital signs and the nursing notes.  Pertinent labs & imaging results that were available during my care of the patient were reviewed by me and considered in my medical decision making (see chart for details).        58 yo F with a significant past medical history of a 3-way bypass surgery comes in with a chief complaint of left-sided chest pressure that radiates to the left arm.  States this feels like the last time when she needed intervention.  We will obtain lab work EKG troponin discussed with cards.  The patients results and plan were reviewed and discussed.  Any x-rays performed were independently reviewed by myself.   Differential diagnosis were considered with the presenting HPI.  Medications  atorvastatin (LIPITOR) tablet 80 mg (has no administration in time range)  aspirin chewable tablet 81 mg (has no administration in time range)  QUEtiapine (SEROQUEL) tablet 150 mg (has no administration in time range)  ferrous sulfate tablet 325 mg (has no administration in time range)  lamoTRIgine (LAMICTAL) tablet 200 mg (has no administration in time range)  albuterol (VENTOLIN HFA) 108 (90 Base) MCG/ACT inhaler 2 puff (has no administration in time range)  fluticasone furoate-vilanterol (BREO ELLIPTA) 200-25 MCG/INH 1 puff  (has no administration in time range)  brimonidine (ALPHAGAN) 0.15 % ophthalmic solution 1 drop (has no administration in time range)  niacin (NIASPAN) CR tablet 1,000 mg (has no administration in time range)  omega-3 acid ethyl esters (LOVAZA) capsule 1 g (has no administration in time range)  nitroGLYCERIN (NITROSTAT) SL tablet 0.4 mg (has no administration in time range)  acetaminophen (TYLENOL) tablet 650 mg (has no administration in time range)  ondansetron (ZOFRAN) injection 4 mg (has no administration in time range)  metoprolol tartrate (LOPRESSOR) tablet 12.5 mg (has no administration in time range)  nitroGLYCERIN (NITROGLYN) 2 % ointment 0.5 inch (has no administration in time range)  prochlorperazine (COMPAZINE) injection 5 mg (5 mg Intravenous Given 06/20/19 1126)    Vitals:   06/20/19 1200 06/20/19 1230 06/20/19 1315 06/20/19 1345  BP: 115/76 136/85  (!) 143/91  Pulse: (!) 50 (!) 47 (!) 53 (!) 59  Resp: 14 18 (!) 22 17  Temp:      TempSrc:      SpO2: 96% 97% 96% 96%  Weight:      Height:        Final diagnoses:  Cardiac chest pain    Admission/ observation were discussed with the admitting physician, patient and/or family and they are comfortable with the plan.    Final Clinical Impressions(s) / ED Diagnoses   Final diagnoses:  Cardiac chest pain    ED Discharge Orders    None       Deno Etienne, DO 06/20/19 1512

## 2019-06-20 NOTE — ED Notes (Signed)
This RN gave pt phone to call family and update them on her care.

## 2019-06-20 NOTE — Progress Notes (Signed)
ANTICOAGULATION CONSULT NOTE - Initial Consult  Pharmacy Consult:  Heparin Indication: chest pain/ACS  Allergies  Allergen Reactions  . Codeine Other (See Comments)    "makes me psychotic"   . Haldol [Haloperidol Decanoate] Anaphylaxis  . Amoxicillin Hives  . Bee Venom   . Benadryl [Diphenhydramine Hcl]     Patient Measurements: Height: 5\' 5"  (165.1 cm) Weight: 184 lb (83.5 kg) IBW/kg (Calculated) : 57 Heparin Dosing Weight: 75 kg  Vital Signs: Temp: 98.4 F (36.9 C) (10/30 1048) Temp Source: Oral (10/30 1048) BP: 143/91 (10/30 1345) Pulse Rate: 59 (10/30 1345)  Labs: Recent Labs    06/20/19 1058  HGB 14.8  HCT 43.5  PLT 271  CREATININE 1.07*  TROPONINIHS 24*    Estimated Creatinine Clearance: 61.9 mL/min (A) (by C-G formula based on SCr of 1.07 mg/dL (H)).   Medical History: Past Medical History:  Diagnosis Date  . Anemia    takes iron supplement  . Arthritis    knees, shoulders  . Bipolar 1 disorder ([place])   . Cataract, immature   . CHF (congestive heart failure) ([place])   . COPD (chronic obstructive pulmonary disease) ([place])   . Coronary artery disease    triple vessel  . Family history of adverse reaction to anesthesia    states father "did not do well" with anesthesia; died last time he had surgery; also had heart disease  . GERD (gastroesophageal reflux disease)    occasional - no current med.  . Glaucoma   . Hammertoe 05/2015   right second toe  . High cholesterol   . History of MI (myocardial infarction)   . Hypertension    states under control with meds., has been on med. x 8 yr.  . Panic attacks   . Peripheral vascular disease ([place])    carotid  . Renal cyst, left   . Schizophrenia ([place])   . Short of breath on exertion   . Sleep apnea    no CPAP use  . Urinary, incontinence, stress female   . Wears dentures    upper  . Wears partial dentures    lower    Assessment: [name] presented with chest pain.  Pharmacy consulted to dose IV  heparin for ACS.  Baseline labs reviewed.  Goal of Therapy:  Heparin level 0.3-0.7 units/ml Monitor platelets by anticoagulation protocol: Yes   Plan:  Heparin 3500 units IV bolus, then Heparin gtt at 900 units/hr Check 6 hr heparin level Daily heparin level and CBC  Leandrew Keech D. Mina Marble, PharmD, BCPS, [place] 06/20/2019, 3:19 PM

## 2019-06-20 NOTE — Progress Notes (Signed)
ANTICOAGULATION CONSULT NOTE   Pharmacy Consult:  Heparin Indication: chest pain/ACS  Allergies  Allergen Reactions  . Benadryl [Diphenhydramine Hcl] Shortness Of Breath and Swelling    Tongue swelling  . Codeine Other (See Comments)    "makes me psychotic"   . Haldol [Haloperidol Decanoate] Anaphylaxis  . Amoxicillin Hives and Swelling    Swelling to extremities Did it involve swelling of the face/tongue/throat, SOB, or low BP? Yes Did it involve sudden or severe rash/hives, skin peeling, or any reaction on the inside of your mouth or nose? Yes Did you need to seek medical attention at a hospital or doctor's office? Yes When did it last happen?young adult If all above answers are "NO", may proceed with cephalosporin use.  . Bee Venom Swelling and Other (See Comments)    "pimples" from head to toe and whole body swelling    Patient Measurements: Height: 5\' 5"  (165.1 cm) Weight: 184 lb (83.5 kg) IBW/kg (Calculated) : 57 Heparin Dosing Weight: 75 kg  Vital Signs: Temp: 98 F (36.7 C) (10/30 2056) Temp Source: Oral (10/30 2056) BP: 143/84 (10/30 2056) Pulse Rate: 46 (10/30 2056)  Labs: Recent Labs    06/20/19 1058 06/20/19 1300 06/20/19 2200  HGB 14.8  --   --   HCT 43.5  --   --   PLT 271  --   --   HEPARINUNFRC  --   --  0.44  CREATININE 1.07*  --   --   TROPONINIHS 24* 28*  --     Estimated Creatinine Clearance: 61.9 mL/min (A) (by C-G formula based on SCr of 1.07 mg/dL (H)).   Medical History: Past Medical History:  Diagnosis Date  . Anemia    takes iron supplement  . Arthritis    knees, shoulders  . Bipolar 1 disorder ([place])   . Cataract, immature   . CHF (congestive heart failure) ([place])   . COPD (chronic obstructive pulmonary disease) ([place])   . Coronary artery disease    triple vessel  . Family history of adverse reaction to anesthesia    states father "did not do well" with anesthesia; died last time he had surgery; also had heart disease   . GERD (gastroesophageal reflux disease)    occasional - no current med.  . Glaucoma   . Hammertoe 05/2015   right second toe  . High cholesterol   . History of MI (myocardial infarction)   . Hypertension    states under control with meds., has been on med. x 8 yr.  . Panic attacks   . Peripheral vascular disease ([place])    carotid  . Renal cyst, left   . Schizophrenia ([place])   . Short of breath on exertion   . Sleep apnea    no CPAP use  . Urinary, incontinence, stress female   . Wears dentures    upper  . Wears partial dentures    lower    Assessment: 32 YOF presented with chest pain.  Pharmacy consulted to dose IV heparin for ACS.  Baseline labs reviewed.  Initial heparin level within goal range.  No overt bleeding or complications noted.  Goal of Therapy:  Heparin level 0.3-0.7 units/ml Monitor platelets by anticoagulation protocol: Yes   Plan:  Continue Heparin gtt at 900 units/hr. Recheck heparin level with AM labs. Daily heparin level and CBC  Marguerite Olea, [facility] Clinical Pharmacist Phone [phone]  06/20/2019 10:41 PM

## 2019-06-20 NOTE — H&P (Addendum)
Cardiology Admission History and Physical:   Patient ID: [name]; MRN: [id number]; DOB: Jun 20, 1961   Admission date: 06/20/2019  Primary Care Provider: Katherina Mires, MD Primary Cardiologist: Lorretta Harp, MD Primary Electrophysiologist:  N/A  Chief Complaint:  Chest Pain  Patient Profile:   [name] is a 58 y.o. female with a history of tobacco use, HLD, OSA tx w/ weight loss, CAD s/p CABG in 06/2007 w/ LIMA to LAD, Vein to ramus branch and PDA presenting with chest pain.  History of Present Illness:   [name] was examined and evaluated at bedside in the ED. She was in her usual state of health until 4-5 days ago when she began to experience 10/10 chest pressure, described as heaviness, that originates in the chest and radiates up her jaw and left arm. She mentions it occurs in intermittent episodes lasting about 3-5 mins and is relieved by rest. She has nitroglycerin at home but she did not take them as they have expired. She mentions prior episodes of chest pain found to be due to obstructive coronary artery disease requiring bypass and mentions that her current symptoms feel similar to these prior episodes. She mentions that she was 'hard-headed' and did not want to leave her cats alone at home so she did not come to the hospital until today. Currently chest pain free after receiving aspirin and nitroglycerin in ED.  On review of systems, she also mentions recurrent near-syncopal episodes over the course of last 2-3 months associated with getting up too fast and she mentions 'seeing the room spin' and falling. She denies any aura, loss of consciousness, bowel/urine incontinence or tongue biting.  In the ED, she was found to have hsTrop of 24 without ST changes on EKG and cardiology was consulted.  Past Medical History:  Diagnosis Date  . Anemia    takes iron supplement  . Arthritis    knees, shoulders  . Bipolar 1 disorder ([place])   . Cataract,  immature   . CHF (congestive heart failure) ([place])   . COPD (chronic obstructive pulmonary disease) ([place])   . Coronary artery disease    triple vessel  . Family history of adverse reaction to anesthesia    states father "did not do well" with anesthesia; died last time he had surgery; also had heart disease  . GERD (gastroesophageal reflux disease)    occasional - no current med.  . Glaucoma   . Hammertoe 05/2015   right second toe  . High cholesterol   . History of MI (myocardial infarction)   . Hypertension    states under control with meds., has been on med. x 8 yr.  . Panic attacks   . Peripheral vascular disease ([place])    carotid  . Renal cyst, left   . Schizophrenia ([place])   . Short of breath on exertion   . Sleep apnea    no CPAP use  . Urinary, incontinence, stress female   . Wears dentures    upper  . Wears partial dentures    lower    Past Surgical History:  Procedure Laterality Date  . C-SECTIONS X 2  1985, 1988  . CARDIAC CATHETERIZATION  12/10/2006; 12/18/2006; 07/10/2007; 07/19/2009  . CARDIAC CATHETERIZATION  03/04/2010   patent grafts, normal LV function, diffusely diseased RCA (Dr. Adora Fridge)  . CARDIAC CATHETERIZATION N/A 02/15/2016   Procedure: Left Heart Cath and Cors/Grafts Angiography;  Surgeon: Peter M Martinique, MD;  Location: [facility]  CV [facility];  Service: Cardiovascular;  Laterality: N/A;  . CLUB FOOT RELEASE Bilateral    1st and 2nd toes  . CORONARY ARTERY BYPASS GRAFT  07/16/2007   LIMA to LAD, VG to ramus, VG to PDA   . FLEXIBLE BRONCHOSCOPY  07/16/2007  . HAMMERTOE RECONSTRUCTION WITH WEIL OSTEOTOMY Right 06/03/2015   Procedure: RIGHT SECOND HAMMERTOE CORRECTION WITH MT WEIL OSTEOTOMY;  Surgeon: Wylene Simmer, MD;  Location: [facility];  Service: Orthopedics;  Laterality: Right;  . HAND SURGERY Bilateral    1st and 2nd fingers - release of clubbing  . KNEE ARTHROSCOPY WITH ANTERIOR CRUCIATE LIGAMENT (ACL) REPAIR Right 01/30/2008  .  NOVASURE ABLATION  12/19/2007   with removal of IUD  . ORIF FOOT FRACTURE Left   . TOE SURGERIES TO REMOVE NAILS DUE TO CLUBBING    . TOTAL KNEE ARTHROPLASTY Right 10/21/2012   Procedure: TOTAL KNEE ARTHROPLASTY;  Surgeon: Mauri Pole, MD;  Location: [facility];  Service: Orthopedics;  Laterality: Right;     Medications Prior to Admission: Prior to Admission medications   Medication Sig Start Date End Date Taking? Authorizing Provider  albuterol (PROVENTIL HFA;VENTOLIN HFA) 108 (90 BASE) MCG/ACT inhaler Inhale 2 puffs into the lungs every 6 (six) hours as needed for wheezing.    [provider]  aspirin 81 MG chewable tablet Chew 81 mg by mouth daily.    [provider]  atorvastatin (LIPITOR) 80 MG tablet Take 1 tablet (80 mg total) by mouth daily at 6 PM. Need to make an appt for overdue visit Patient not taking: Reported on 01/01/2019 08/19/18   Lorretta Harp, MD  brimonidine (ALPHAGAN) 0.15 % ophthalmic solution Place 1 drop into both eyes 2 (two) times daily. 2 drops left eye BID; 1 drop right eye BID    [provider]  ferrous sulfate 325 (65 FE) MG tablet Take 325 mg by mouth daily with breakfast.    [provider]  Fluticasone-Salmeterol (ADVAIR) 250-50 MCG/DOSE AEPB Inhale 1 puff into the lungs 2 (two) times daily.    [provider]  isosorbide mononitrate (IMDUR) 30 MG 24 hr tablet TAKE 1 TABLET (30 MG TOTAL) BY MOUTH DAILY. NEED OFFICE VISIT. 02/07/19   Lorretta Harp, MD  lamoTRIgine (LAMICTAL) 100 MG tablet Take 200 mg by mouth daily.     [provider]  lisinopril (ZESTRIL) 10 MG tablet Take 1 tablet (10 mg total) by mouth daily. 01/06/19   Lorretta Harp, MD  metoprolol tartrate (LOPRESSOR) 25 MG tablet Take 0.5 tablets (12.5 mg total) by mouth 2 (two) times daily. 01/06/19   Lorretta Harp, MD  niacin (NIASPAN) 1000 MG CR tablet TAKE 1 TABLET (1,000 MG TOTAL) BY MOUTH AT BEDTIME. 02/07/19   Lorretta Harp, MD   nitroGLYCERIN (NITROSTAT) 0.4 MG SL tablet Place 1 tablet (0.4 mg total) under the tongue every 5 (five) minutes as needed. 03/19/18   Lorretta Harp, MD  omega-3 acid ethyl esters (LOVAZA) 1 g capsule Take 1 capsule (1 g total) by mouth 2 (two) times daily. 01/29/19   Lorretta Harp, MD  QUEtiapine (SEROQUEL) 25 MG tablet Take 150 mg by mouth at bedtime. Reported on 02/11/2016 12/01/13   [provider]     Allergies:    Allergies  Allergen Reactions  . Codeine Other (See Comments)    "makes me psychotic"   . Haldol [Haloperidol Decanoate] Anaphylaxis  . Amoxicillin Hives  . Bee Venom   .  Benadryl [Diphenhydramine Hcl]    Social History:   Social History   Socioeconomic History  . Marital status: Divorced    Spouse name: Not on file  . Number of children: Not on file  . Years of education: Not on file  . Highest education level: Not on file  Occupational History  . Not on file  Social Needs  . Financial resource strain: Not on file  . Food insecurity    Worry: Not on file    Inability: Not on file  . Transportation needs    Medical: Not on file    Non-medical: Not on file  Tobacco Use  . Smoking status: Former Smoker    Packs/day: 0.00    Years: 0.00    Pack years: 0.00    Quit date: 05/21/2015    Years since quitting: 4.0  . Smokeless tobacco: Never Used  . Tobacco comment: pt states she quit a week ago 06/14/19  Substance and Sexual Activity  . Alcohol use: No  . Drug use: No  . Sexual activity: Not on file  Lifestyle  . Physical activity    Days per week: Not on file    Minutes per session: Not on file  . Stress: Not on file  Relationships  . Social Herbalist on phone: Not on file    Gets together: Not on file    Attends religious service: Not on file    Active member of club or organization: Not on file    Attends meetings of clubs or organizations: Not on file    Relationship status: Not on file  . Intimate partner violence     Fear of current or ex partner: Not on file    Emotionally abused: Not on file    Physically abused: Not on file    Forced sexual activity: Not on file  Other Topics Concern  . Not on file  Social History Narrative  . Not on file    Family History:   Father had MI at 31.  The patient's family history includes Anesthesia problems in her father; Colon cancer in her sister; Heart disease in her father.    ROS:  Please see the history of present illness.  Review of Systems  Constitutional: Negative for chills, fever and malaise/fatigue.  Eyes: Negative for blurred vision.  Respiratory: Negative for shortness of breath.   Cardiovascular: Positive for chest pain. Negative for palpitations and leg swelling.  Gastrointestinal: Positive for abdominal pain and constipation. Negative for diarrhea, nausea and vomiting.  Musculoskeletal: Positive for falls and neck pain.  Neurological: Positive for dizziness and headaches.   All other ROS reviewed and negative.     Physical Exam/Data:   Vitals:   06/20/19 1115 06/20/19 1130 06/20/19 1145 06/20/19 1200  BP: (!) 136/92 117/87 97/63 115/76  Pulse: 61 (!) 59 (!) 55 (!) 50  Resp: 16 20 (!) 24 14  Temp:      TempSrc:      SpO2: 97% 95% 96% 96%  Weight:      Height:       No intake or output data in the 24 hours ending 06/20/19 1245 Filed Weights   06/20/19 1042  Weight: 83.5 kg   Body mass index is 30.62 kg/m.   Gen: Well-developed, well nourished, NAD HEENT: NCAT head, hearing intact, EOMI, PERRL, No nasal discharge, MMM Neck: supple, ROM intact, no JVD, no cervical adenopathy CV: Bradycardic, regular rhythm, no murmurs, faded surgical  scar over chest, no chest wall tenderness Pulm: CTAB, No rales, no wheezes Abd: Soft, BS+, NTND, No rebound, no guarding Extm: ROM intact, Peripheral pulses intact, No peripheral edema Skin: Dry, Warm, normal turgor, area of scraped skin on left knee Neuro: AAOx3, Cranial Nerve II-XII intact, DTR  intact Psych: Normal mood and affect  EKG:  The ECG that was done 06/20/19 was personally reviewed and demonstrates normal sinus, normal axis, no ST changes  Relevant CV Studies:  Left Heart Cath 02/15/16 Conclusion   Mid RCA lesion, 90% stenosed.  Ost RCA to Mid RCA lesion, 40% stenosed.  Dist RCA lesion, 100% stenosed. The lesion was previously treated with a stent (unknown type) greater than two years ago.  SVG to the PDA was injected is normal in caliber.  Ost Ramus to Ramus lesion, 75% stenosed.  SVG to the ramus was injected is normal in caliber.  Prox LAD to Mid LAD lesion, 100% stenosed.  LIMA to the LAD was injected is normal in caliber, and is anatomically normal.  The left ventricular systolic function is normal.   1. Severe 3 vessel obstructive CAD 2. Patent grafts including LIMA to the LAD, SVG to the ramus intermediate, SVG to the PDA 3. Good LV function.   Laboratory Data:  Chemistry Recent Labs  Lab 06/20/19 1058  NA 140  K 4.1  CL 105  CO2 20*  GLUCOSE 154*  BUN 16  CREATININE 1.07*  CALCIUM 9.5  GFRNONAA 58*  GFRAA >60  ANIONGAP 15    No results for input(s): PROT, ALBUMIN, AST, ALT, ALKPHOS, BILITOT in the last 168 hours. Hematology Recent Labs  Lab 06/20/19 1058  WBC 15.4*  RBC 4.48  HGB 14.8  HCT 43.5  MCV 97.1  MCH 33.0  MCHC 34.0  RDW 14.3  PLT 271   Cardiac EnzymesNo results for input(s): TROPONINI in the last 168 hours. No results for input(s): TROPIPOC in the last 168 hours.  BNPNo results for input(s): BNP, PROBNP in the last 168 hours.  DDimer No results for input(s): DDIMER in the last 168 hours.  Radiology/Studies:  Dg Chest Portable 1 View  Result Date: 06/20/2019 CLINICAL DATA:  Chest pain EXAM: PORTABLE CHEST 1 VIEW COMPARISON:  02/11/2016 FINDINGS: Cardiomegaly status post median sternotomy and CABG with numerous fractured sternotomy wires. Both lungs are clear. The visualized skeletal structures are  unremarkable. IMPRESSION: 1. Cardiomegaly without acute abnormality of the lungs in AP portable projection. 2. Numerous fractured sternotomy wires, which may be symptomatic although unchanged compared to prior examination. Electronically Signed   By: Eddie Candle M.D.   On: 06/20/2019 11:17    Assessment and Plan:   Typical Chest Pain CAD s/p CABG Presents with typical chest pain in patient w/ known CAD s/p CABG. 3/3 on diamond. Heart Score of 6. Initial HsTrop 24. EKG w/o ischemic changes. Chest X-ray w/o significant acute findings. High risk of ischemic event w/ known disease. Plan for cath for further assessment. - Echocardiogram - Trend troponin - Start heparin gtt - C/w Asa, atorvastatin - Nitro PRN for chest pain - Monitor vitals - Telemetry - Check lipid panel, hgb a1c - Plan for cath  Near Syncopal Episodes Mentions hx of recurrent falling episodes associated with 'room spinning' and light-headedness. Appears to vaso-vagal. Cardiac work-up for chest pain with provide additional insight. - F/u Echo - Telemetry - Orthostatic vital signs  Severity of Illness: The appropriate patient status for this patient is INPATIENT. Inpatient status is judged to be  reasonable and necessary in order to provide the required intensity of service to ensure the patient's safety. The patient's presenting symptoms, physical exam findings, and initial radiographic and laboratory data in the context of their chronic comorbidities is felt to place them at high risk for further clinical deterioration. Furthermore, it is not anticipated that the patient will be medically stable for discharge from the hospital within 2 midnights of admission. The following factors support the patient status of inpatient.   " The patient's presenting symptoms include chest pain. " The worrisome physical exam findings include left arm numbness and tingling. " The initial radiographic and laboratory data are worrisome because  of elevated hsTroponin. " The chronic co-morbidities include CAD, HTN, HLD .  * I certify that at the point of admission it is my clinical judgment that the patient will require inpatient hospital care spanning beyond 2 midnights from the point of admission due to high intensity of service, high risk for further deterioration and high frequency of surveillance required.*   For questions or updates, please contact [place] Please consult [email or web address] for contact info under Cardiology/STEMI.   Signed, Gilberto Better, MD PGY-2, [facility] Pager: [phone] 06/20/2019 12:45 PM   Attending attestation to follow

## 2019-06-20 NOTE — ED Triage Notes (Signed)
Pt arrives via EMS from home complaining of chest pressure for 4 days. Syncopal episode 4 days ago. Pain radiates down left arm. Pt has had CABG. EMS gave 324mg  ASA and 1 nitro. 227mL NS. Pain 9/10 originally, decreased to 6/10 with nitro. BP 136/87, HR 66, resp 18, 90% room air

## 2019-06-20 NOTE — Progress Notes (Signed)
  Echocardiogram 2D Echocardiogram has been performed.  [name] 06/20/2019, 4:26 PM

## 2019-06-21 DIAGNOSIS — E782 Mixed hyperlipidemia: Secondary | ICD-10-CM | POA: Diagnosis not present

## 2019-06-21 DIAGNOSIS — I249 Acute ischemic heart disease, unspecified: Secondary | ICD-10-CM

## 2019-06-21 LAB — HEMOGLOBIN A1C
Hgb A1c MFr Bld: 6.1 % — ABNORMAL HIGH (ref 4.8–5.6)
Mean Plasma Glucose: 128.37 mg/dL

## 2019-06-21 LAB — LIPID PANEL
Cholesterol: 381 mg/dL — ABNORMAL HIGH (ref 0–200)
HDL: 46 mg/dL (ref >40)
LDL Cholesterol: 298 mg/dL — ABNORMAL HIGH (ref 0–99)
Total CHOL/HDL Ratio: 8.3 RATIO
Triglycerides: 185 mg/dL — ABNORMAL HIGH (ref <150)
VLDL: 37 mg/dL (ref 0–40)

## 2019-06-21 LAB — BASIC METABOLIC PANEL
Anion gap: 16 — ABNORMAL HIGH (ref 5–15)
BUN: 20 mg/dL (ref 6–20)
CO2: 21 mmol/L — ABNORMAL LOW (ref 22–32)
Calcium: 9.5 mg/dL (ref 8.9–10.3)
Chloride: 102 mmol/L (ref 98–111)
Creatinine, Ser: 1.06 mg/dL — ABNORMAL HIGH (ref 0.44–1.00)
GFR calc Af Amer: >60 mL/min (ref >60)
GFR calc non Af Amer: 58 mL/min — ABNORMAL LOW (ref >60)
Glucose, Bld: 88 mg/dL (ref 70–99)
Potassium: 3.8 mmol/L (ref 3.5–5.1)
Sodium: 139 mmol/L (ref 135–145)

## 2019-06-21 LAB — CBC
HCT: 41 % (ref 36.0–46.0)
Hemoglobin: 14 g/dL (ref 12.0–15.0)
MCH: 33.1 pg (ref 26.0–34.0)
MCHC: 34.1 g/dL (ref 30.0–36.0)
MCV: 96.9 fL (ref 80.0–100.0)
Platelets: 238 10*3/uL (ref 150–400)
RBC: 4.23 MIL/uL (ref 3.87–5.11)
RDW: 14.2 % (ref 11.5–15.5)
WBC: 9.7 10*3/uL (ref 4.0–10.5)
nRBC: 0 % (ref 0.0–0.2)

## 2019-06-21 LAB — HEPARIN LEVEL (UNFRACTIONATED)
Heparin Unfractionated: 0.83 IU/mL — ABNORMAL HIGH (ref 0.30–0.70)
Heparin Unfractionated: 0.26 [IU]/mL — ABNORMAL LOW (ref 0.30–0.70)

## 2019-06-21 LAB — TSH: TSH: 1.695 u[IU]/mL (ref 0.350–4.500)

## 2019-06-21 MED ORDER — NITROGLYCERIN 2 % TD OINT
0.5000 [in_us] | TOPICAL_OINTMENT | Freq: Four times a day (QID) | TRANSDERMAL | Status: DC
  Administered 2019-06-21 – 2019-06-23 (×10): 0.5 [in_us] via TOPICAL
  Filled 2019-06-21: qty 30

## 2019-06-21 MED ORDER — INFLUENZA VAC SPLIT QUAD 0.5 ML IM SUSY
0.5000 mL | PREFILLED_SYRINGE | INTRAMUSCULAR | Status: DC
  Filled 2019-06-21 (×2): qty 0.5

## 2019-06-21 NOTE — Progress Notes (Signed)
[facility] for Heparin Indication: chest pain/ACS  Allergies  Allergen Reactions  . Benadryl [Diphenhydramine Hcl] Shortness Of Breath and Swelling    Tongue swelling  . Codeine Other (See Comments)    "makes me psychotic"   . Haldol [Haloperidol Decanoate] Anaphylaxis  . Amoxicillin Hives and Swelling    Swelling to extremities Did it involve swelling of the face/tongue/throat, SOB, or low BP? Yes Did it involve sudden or severe rash/hives, skin peeling, or any reaction on the inside of your mouth or nose? Yes Did you need to seek medical attention at a hospital or doctor's office? Yes When did it last happen?young adult If all above answers are "NO", may proceed with cephalosporin use.  . Bee Venom Swelling and Other (See Comments)    "pimples" from head to toe and whole body swelling    Patient Measurements: Height: 5\' 5"  (165.1 cm) Weight: 186 lb 4.8 oz (84.5 kg) IBW/kg (Calculated) : 57 Heparin Dosing Weight: 74.9 kg  Vital Signs: BP: 127/73 (10/31 1437)  Labs: Recent Labs    06/20/19 1058 06/20/19 1300 06/20/19 2200 06/21/19 0453 06/21/19 1415  HGB 14.8  --   --  14.0  --   HCT 43.5  --   --  41.0  --   PLT 271  --   --  238  --   HEPARINUNFRC  --   --  0.44 0.83* 0.26*  CREATININE 1.07*  --   --  1.06*  --   TROPONINIHS 24* 28*  --   --   --     Estimated Creatinine Clearance: 62.9 mL/min (A) (by C-G formula based on SCr of 1.06 mg/dL (H)).  Assessment: 58 yo female presented with chest pain. Pharmacy consulted to dose IV heparin for ACS.  Heparin level is now slightly sub-therapeutic.  RN reports that heparin was not infusing for 10-15 min around 1600 today, after the lab draw.  No bleeding reported.  Goal of Therapy:  Heparin level 0.3-0.7 units/ml Monitor platelets by anticoagulation protocol: Yes   Plan:  Increase heparin gtt to 850 units/hr F/U AM labs  Zyrah Wiswell D. Mina Marble, PharmD, BCPS, [place] 06/21/2019,  4:40 PM

## 2019-06-21 NOTE — Progress Notes (Signed)
Patient complained of chest pain (level 6 on a scale of 0-10) located at the left midclavicular line that radiated towards her left shoulder on 10/30 @ 2305; also stated that she felt flushed.  Patient stated that the pain felt like a heaviness & pressure.  Patient was placed on 2 liters of oxygen via nasal cannula, 1 dose of SL nitroglycerin.  Patient was reassessed @ 2310 and stated that the level of pain went down to that of a 2 (out of 0-10).  Will continue to monitor, MD notified. Elaina Hoops, RN

## 2019-06-21 NOTE — Progress Notes (Signed)
Patient stated she had a fall in the tub about 5 months ago and has had a headache ever since. Headache constant, 8-10/10 and nothing makes it worse or better. Starts posterior head and radiates down left posterior to left posterior neck. Refused any medication because nothing has helped in the past and she has learned "to live with it". She has reported it to her PCP and nothing was ordered.

## 2019-06-21 NOTE — Progress Notes (Addendum)
Progress Note  Patient Name: [name] Date of Encounter: 06/21/2019  Primary Cardiologist: Gwenlyn Found   Subjective   58 year old female with a history of coronary artery disease-status post coronary artery bypass grafting.  She has a history of tobacco use, hyperlipidemia, obstructive sleep apnea.  She presented to the emergency room yesterday with episodes of chest pain and mildly elevated troponin levels.   LDL of 298.  The total cholesterol is 381.  HDL is 46.  Triglycerides are 185. PCSK9 inhibitors are not available inpatient .    Inpatient Medications    Scheduled Meds: . aspirin  81 mg Oral Daily  . atorvastatin  80 mg Oral q1800  . brimonidine  1 drop Both Eyes BID  . fluticasone furoate-vilanterol  1 puff Inhalation Daily  . lamoTRIgine  150 mg Oral Daily  . metoprolol tartrate  12.5 mg Oral BID  . niacin  1,000 mg Oral QHS  . nitroGLYCERIN  0.5 inch Topical Q6H  . omega-3 acid ethyl esters  1 capsule Oral BID  . QUEtiapine  150 mg Oral QHS   Continuous Infusions: . heparin 900 Units/hr (06/20/19 1528)   PRN Meds: acetaminophen, albuterol, nitroGLYCERIN, nitroGLYCERIN, ondansetron (ZOFRAN) IV   Vital Signs    Vitals:   06/20/19 2356 06/21/19 0631 06/21/19 0656 06/21/19 0807  BP: 126/85 126/65    Pulse: (!) 58     Resp:      Temp:      TempSrc:      SpO2:    98%  Weight:   84.5 kg   Height:        Intake/Output Summary (Last 24 hours) at 06/21/2019 0840 Last data filed at 06/21/2019 0650 Gross per 24 hour  Intake -  Output 600 ml  Net -600 ml   Last 3 Weights 06/21/2019 06/20/2019 10/29/2017  Weight (lbs) 186 lb 4.8 oz 184 lb 190 lb  Weight (kg) 84.505 kg 83.462 kg 86.183 kg      Telemetry    NSR - Personally Reviewed  ECG     Physical Exam   Physical Exam: Blood pressure 118/73, pulse (!) 54, temperature 97.7 F (36.5 C), temperature source Oral, resp. rate 19, height 5\' 5"  (1.651 m), weight 84.3 kg, last menstrual period  11/16/2011, SpO2 96 %.  GEN:   Moderately obese  HEENT: Normal NECK: No JVD; No carotid bruits LYMPHATICS: No lymphadenopathy CARDIAC: RRR , no murmurs, rubs, gallops RESPIRATORY:  Clear to auscultation without rales, wheezing or rhonchi  ABDOMEN: Soft, non-tender, non-distended MUSCULOSKELETAL:  No edema; No deformity  SKIN: Warm and dry NEUROLOGIC:  Alert and oriented x 3    Labs    High Sensitivity Troponin:   Recent Labs  Lab 06/20/19 1058 06/20/19 1300  TROPONINIHS 24* 28*      Chemistry Recent Labs  Lab 06/20/19 1058 06/21/19 0453  NA 140 139  K 4.1 3.8  CL 105 102  CO2 20* 21*  GLUCOSE 154* 88  BUN 16 20  CREATININE 1.07* 1.06*  CALCIUM 9.5 9.5  GFRNONAA 58* 58*  GFRAA >60 >60  ANIONGAP 15 16*     Hematology Recent Labs  Lab 06/20/19 1058 06/21/19 0453  WBC 15.4* 9.7  RBC 4.48 4.23  HGB 14.8 14.0  HCT 43.5 41.0  MCV 97.1 96.9  MCH 33.0 33.1  MCHC 34.0 34.1  RDW 14.3 14.2  PLT 271 238    BNPNo results for input(s): BNP, PROBNP in the last 168 hours.   DDimer  No results for input(s): DDIMER in the last 168 hours.   Radiology    Dg Chest Portable 1 View  Result Date: 06/20/2019 CLINICAL DATA:  Chest pain EXAM: PORTABLE CHEST 1 VIEW COMPARISON:  02/11/2016 FINDINGS: Cardiomegaly status post median sternotomy and CABG with numerous fractured sternotomy wires. Both lungs are clear. The visualized skeletal structures are unremarkable. IMPRESSION: 1. Cardiomegaly without acute abnormality of the lungs in AP portable projection. 2. Numerous fractured sternotomy wires, which may be symptomatic although unchanged compared to prior examination. Electronically Signed   By: Eddie Candle M.D.   On: 06/20/2019 11:17    Cardiac Studies     Patient Profile     58 y.o. female  With hx of hyperlipidemia and premature coronary artery disease.  She is admitted with chest pain that is identical to her previous episodes of angina.  Assessment & Plan     1.  Unstable angina ;m patient has a history of CAD and is status post CABG at age 40.   .  She is pain-free at present.   She is on for heart catheterization tomorrow.   We have discussed the risks, benefits, options of heart catheterization.  She understands and agrees to proceed. She has a long history of cigarette smoking.  She says that she has stopped smoking.   2.  Hyperlipidemia: The patient has atorvastatin 80 mg a day listed on her home medications.  She was not sure of whether or not she was taking it.  I checked with pharmacy and initiation of PCSK9 inhibitors is not available in the hospital.  We will continue atorvastatin for now.  Consider rosuvastatin.       For questions or updates, please contact [place] Please consult [email or web address] for contact info under        Signed, Mertie Moores, MD  06/21/2019, 8:40 AM

## 2019-06-21 NOTE — Progress Notes (Signed)
ANTICOAGULATION CONSULT NOTE - Follow Up Consult  Pharmacy Consult for Heparin Indication: chest pain/ACS  Allergies  Allergen Reactions  . Benadryl [Diphenhydramine Hcl] Shortness Of Breath and Swelling    Tongue swelling  . Codeine Other (See Comments)    "makes me psychotic"   . Haldol [Haloperidol Decanoate] Anaphylaxis  . Amoxicillin Hives and Swelling    Swelling to extremities Did it involve swelling of the face/tongue/throat, SOB, or low BP? Yes Did it involve sudden or severe rash/hives, skin peeling, or any reaction on the inside of your mouth or nose? Yes Did you need to seek medical attention at a hospital or doctor's office? Yes When did it last happen?young adult If all above answers are "NO", may proceed with cephalosporin use.  . Bee Venom Swelling and Other (See Comments)    "pimples" from head to toe and whole body swelling    Patient Measurements: Height: 5\' 5"  (165.1 cm) Weight: 186 lb 4.8 oz (84.5 kg) IBW/kg (Calculated) : 57 Heparin Dosing Weight: 74.9 kg  Vital Signs: Temp: 98 F (36.7 C) (10/30 2056) Temp Source: Oral (10/30 2056) BP: 126/65 (10/31 0631) Pulse Rate: 58 (10/30 2356)  Labs: Recent Labs    06/20/19 1058 06/20/19 1300 06/20/19 2200 06/21/19 0453  HGB 14.8  --   --  14.0  HCT 43.5  --   --  41.0  PLT 271  --   --  238  HEPARINUNFRC  --   --  0.44 0.83*  CREATININE 1.07*  --   --   --   TROPONINIHS 24* 28*  --   --     Estimated Creatinine Clearance: 62.3 mL/min (A) (by C-G formula based on SCr of 1.07 mg/dL (H)).  Assessment: 58 yo female presented with chest pain. Pharmacy consulted to dose IV heparin for ACS. Received heparin 3500 units IV bolus then started on heparin IV 900 units/hour.  Morning heparin level supratherapeutic. H&H stable, no bleeding noted.  Goal of Therapy:  Heparin level 0.3-0.7 units/ml Monitor platelets by anticoagulation protocol: Yes   Plan:  Decrease heparin to 750 units/hour Recheck  heparin level in 6 hours at 1400 Continue to monitor H&H and platelets    Thank you for allowing pharmacy to participate in this patient's care.  Marializ Ferrebee L. Devin Going, [place] PGY1 Pharmacy Resident 06/21/19      7:30 AM  Please check AMION for all [place] phone numbers After 10:00 PM, call the [place] [phone]

## 2019-06-22 LAB — CBC
HCT: 40.5 % (ref 36.0–46.0)
Hemoglobin: 13.6 g/dL (ref 12.0–15.0)
MCH: 33 pg (ref 26.0–34.0)
MCHC: 33.6 g/dL (ref 30.0–36.0)
MCV: 98.3 fL (ref 80.0–100.0)
Platelets: 254 10*3/uL (ref 150–400)
RBC: 4.12 MIL/uL (ref 3.87–5.11)
RDW: 14.2 % (ref 11.5–15.5)
WBC: 9.8 10*3/uL (ref 4.0–10.5)
nRBC: 0 % (ref 0.0–0.2)

## 2019-06-22 LAB — HEPARIN LEVEL (UNFRACTIONATED): Heparin Unfractionated: 0.43 IU/mL (ref 0.30–0.70)

## 2019-06-22 MED ORDER — SODIUM CHLORIDE 0.9 % WEIGHT BASED INFUSION
3.0000 mL/kg/h | INTRAVENOUS | Status: DC
  Administered 2019-06-23: 3 mL/kg/h via INTRAVENOUS

## 2019-06-22 MED ORDER — SODIUM CHLORIDE 0.9% FLUSH: 3.0000 mL | Freq: Two times a day (BID) | INTRAVENOUS | Status: DC

## 2019-06-22 MED ORDER — SODIUM CHLORIDE 0.9 % WEIGHT BASED INFUSION: 1.0000 mL/kg/h | INTRAVENOUS | Status: DC

## 2019-06-22 MED ORDER — SODIUM CHLORIDE 0.9 % IV SOLN: 250.0000 mL | INTRAVENOUS | Status: DC | PRN

## 2019-06-22 MED ORDER — SODIUM CHLORIDE 0.9% FLUSH: 3.0000 mL | INTRAVENOUS | Status: DC | PRN

## 2019-06-22 MED ORDER — ALBUTEROL SULFATE (2.5 MG/3ML) 0.083% IN NEBU: 2.5000 mg | INHALATION_SOLUTION | Freq: Four times a day (QID) | RESPIRATORY_TRACT | Status: DC | PRN

## 2019-06-22 MED ORDER — ASPIRIN 81 MG PO CHEW
81.0000 mg | CHEWABLE_TABLET | ORAL | Status: AC
  Administered 2019-06-23: 81 mg via ORAL
  Filled 2019-06-22: qty 1

## 2019-06-22 NOTE — H&P (View-Only) (Signed)
Progress Note  Patient Name: [name] Date of Encounter: 06/22/2019  Primary Cardiologist: Gwenlyn Found   Subjective   58 year old female with a history of coronary artery disease-status post coronary artery bypass grafting.  She has a history of tobacco use, hyperlipidemia, obstructive sleep apnea.  She presented to the emergency room yesterday with episodes of chest pain and mildly elevated troponin levels.   LDL of 298.  The total cholesterol is 381.  HDL is 46.  Triglycerides are 185. PCSK9 inhibitors are not available inpatient .    Inpatient Medications    Scheduled Meds: . aspirin  81 mg Oral Daily  . atorvastatin  80 mg Oral q1800  . brimonidine  1 drop Both Eyes BID  . fluticasone furoate-vilanterol  1 puff Inhalation Daily  . [START ON 06/23/2019] influenza vac split quadrivalent PF  0.5 mL Intramuscular Tomorrow-1000  . lamoTRIgine  150 mg Oral Daily  . metoprolol tartrate  12.5 mg Oral BID  . niacin  1,000 mg Oral QHS  . nitroGLYCERIN  0.5 inch Topical Q6H  . omega-3 acid ethyl esters  1 capsule Oral BID  . QUEtiapine  150 mg Oral QHS   Continuous Infusions: . heparin 850 Units/hr (06/21/19 1657)   PRN Meds: acetaminophen, albuterol, nitroGLYCERIN, nitroGLYCERIN, ondansetron (ZOFRAN) IV   Vital Signs    Vitals:   06/21/19 1816 06/21/19 2109 06/22/19 0623 06/22/19 0916  BP: (!) 152/93 (!) 156/102 118/73 (!) 141/105  Pulse:  (!) 52 (!) 54 72  Resp:  19    Temp:  (!) 97.5 F (36.4 C) 97.7 F (36.5 C)   TempSrc:  Oral Oral   SpO2:  99% 96%   Weight:   84.3 kg   Height:        Intake/Output Summary (Last 24 hours) at 06/22/2019 1309 Last data filed at 06/22/2019 1151 Gross per 24 hour  Intake 791.3 ml  Output 400 ml  Net 391.3 ml   Last 3 Weights 06/22/2019 06/21/2019 06/20/2019  Weight (lbs) 185 lb 14.4 oz 186 lb 4.8 oz 184 lb  Weight (kg) 84.324 kg 84.505 kg 83.462 kg      Telemetry    NSR - Personally Reviewed  ECG     Physical Exam   Physical Exam: Blood pressure (!) 141/105, pulse 72, temperature 97.7 F (36.5 C), temperature source Oral, resp. rate 19, height 5\' 5"  (1.651 m), weight 84.3 kg, last menstrual period 11/16/2011, SpO2 96 %.  GEN:   Moderately obese  HEENT: Normal NECK: No JVD; No carotid bruits LYMPHATICS: No lymphadenopathy CARDIAC: RRR , no murmurs, rubs, gallops RESPIRATORY:  Clear to auscultation without rales, wheezing or rhonchi  ABDOMEN: Soft, non-tender, non-distended MUSCULOSKELETAL:  No edema; No deformity  SKIN: Warm and dry NEUROLOGIC:  Alert and oriented x 3    Labs    High Sensitivity Troponin:   Recent Labs  Lab 06/20/19 1058 06/20/19 1300  TROPONINIHS 24* 28*      Chemistry Recent Labs  Lab 06/20/19 1058 06/21/19 0453  NA 140 139  K 4.1 3.8  CL 105 102  CO2 20* 21*  GLUCOSE 154* 88  BUN 16 20  CREATININE 1.07* 1.06*  CALCIUM 9.5 9.5  GFRNONAA 58* 58*  GFRAA >60 >60  ANIONGAP 15 16*     Hematology Recent Labs  Lab 06/20/19 1058 06/21/19 0453 06/22/19 0349  WBC 15.4* 9.7 9.8  RBC 4.48 4.23 4.12  HGB 14.8 14.0 13.6  HCT 43.5 41.0 40.5  MCV 97.1  96.9 98.3  MCH 33.0 33.1 33.0  MCHC 34.0 34.1 33.6  RDW 14.3 14.2 14.2  PLT 271 238 254    BNPNo results for input(s): BNP, PROBNP in the last 168 hours.   DDimer No results for input(s): DDIMER in the last 168 hours.   Radiology    No results found.  Cardiac Studies     Patient Profile     58 y.o. female  With hx of hyperlipidemia and premature coronary artery disease.  She is admitted with chest pain that is identical to her previous episodes of angina.  Assessment & Plan    1.  Unstable angina ;m patient has a history of CAD and is status post CABG at age 66.   .  She is pain-free at present.   She is on for heart catheterization tomorrow.   We have discussed the risks, benefits, options of heart catheterization.  She understands and agrees to proceed. She has a long history of cigarette  smoking.  She says that she has stopped smoking.   2.  Hyperlipidemia: The patient has atorvastatin 80 mg a day listed on her home medications.  She was not sure of whether or not she was taking it.  I checked with pharmacy and initiation of PCSK9 inhibitors is not available in the hospital.  We will continue atorvastatin for now.  Consider rosuvastatin.       For questions or updates, please contact [place] Please consult [email or web address] for contact info under        Signed, Mertie Moores, MD  06/22/2019, 1:09 PM

## 2019-06-22 NOTE — Progress Notes (Signed)
Progress Note  Patient Name: [name] Date of Encounter: 06/22/2019  Primary Cardiologist: Gwenlyn Found   Subjective   58 year old female with a history of coronary artery disease-status post coronary artery bypass grafting.  She has a history of tobacco use, hyperlipidemia, obstructive sleep apnea.  She presented to the emergency room yesterday with episodes of chest pain and mildly elevated troponin levels.   LDL of 298.  The total cholesterol is 381.  HDL is 46.  Triglycerides are 185. PCSK9 inhibitors are not available inpatient .    Inpatient Medications    Scheduled Meds: . aspirin  81 mg Oral Daily  . atorvastatin  80 mg Oral q1800  . brimonidine  1 drop Both Eyes BID  . fluticasone furoate-vilanterol  1 puff Inhalation Daily  . [START ON 06/23/2019] influenza vac split quadrivalent PF  0.5 mL Intramuscular Tomorrow-1000  . lamoTRIgine  150 mg Oral Daily  . metoprolol tartrate  12.5 mg Oral BID  . niacin  1,000 mg Oral QHS  . nitroGLYCERIN  0.5 inch Topical Q6H  . omega-3 acid ethyl esters  1 capsule Oral BID  . QUEtiapine  150 mg Oral QHS   Continuous Infusions: . heparin 850 Units/hr (06/21/19 1657)   PRN Meds: acetaminophen, albuterol, nitroGLYCERIN, nitroGLYCERIN, ondansetron (ZOFRAN) IV   Vital Signs    Vitals:   06/21/19 1816 06/21/19 2109 06/22/19 0623 06/22/19 0916  BP: (!) 152/93 (!) 156/102 118/73 (!) 141/105  Pulse:  (!) 52 (!) 54 72  Resp:  19    Temp:  (!) 97.5 F (36.4 C) 97.7 F (36.5 C)   TempSrc:  Oral Oral   SpO2:  99% 96%   Weight:   84.3 kg   Height:        Intake/Output Summary (Last 24 hours) at 06/22/2019 1309 Last data filed at 06/22/2019 1151 Gross per 24 hour  Intake 791.3 ml  Output 400 ml  Net 391.3 ml   Last 3 Weights 06/22/2019 06/21/2019 06/20/2019  Weight (lbs) 185 lb 14.4 oz 186 lb 4.8 oz 184 lb  Weight (kg) 84.324 kg 84.505 kg 83.462 kg      Telemetry    NSR - Personally Reviewed  ECG     Physical Exam   Physical Exam: Blood pressure (!) 141/105, pulse 72, temperature 97.7 F (36.5 C), temperature source Oral, resp. rate 19, height 5\' 5"  (1.651 m), weight 84.3 kg, last menstrual period 11/16/2011, SpO2 96 %.  GEN:   Moderately obese  HEENT: Normal NECK: No JVD; No carotid bruits LYMPHATICS: No lymphadenopathy CARDIAC: RRR , no murmurs, rubs, gallops RESPIRATORY:  Clear to auscultation without rales, wheezing or rhonchi  ABDOMEN: Soft, non-tender, non-distended MUSCULOSKELETAL:  No edema; No deformity  SKIN: Warm and dry NEUROLOGIC:  Alert and oriented x 3    Labs    High Sensitivity Troponin:   Recent Labs  Lab 06/20/19 1058 06/20/19 1300  TROPONINIHS 24* 28*      Chemistry Recent Labs  Lab 06/20/19 1058 06/21/19 0453  NA 140 139  K 4.1 3.8  CL 105 102  CO2 20* 21*  GLUCOSE 154* 88  BUN 16 20  CREATININE 1.07* 1.06*  CALCIUM 9.5 9.5  GFRNONAA 58* 58*  GFRAA >60 >60  ANIONGAP 15 16*     Hematology Recent Labs  Lab 06/20/19 1058 06/21/19 0453 06/22/19 0349  WBC 15.4* 9.7 9.8  RBC 4.48 4.23 4.12  HGB 14.8 14.0 13.6  HCT 43.5 41.0 40.5  MCV 97.1  96.9 98.3  MCH 33.0 33.1 33.0  MCHC 34.0 34.1 33.6  RDW 14.3 14.2 14.2  PLT 271 238 254    BNPNo results for input(s): BNP, PROBNP in the last 168 hours.   DDimer No results for input(s): DDIMER in the last 168 hours.   Radiology    No results found.  Cardiac Studies     Patient Profile     58 y.o. female  With hx of hyperlipidemia and premature coronary artery disease.  She is admitted with chest pain that is identical to her previous episodes of angina.  Assessment & Plan    1.  Unstable angina ;m patient has a history of CAD and is status post CABG at age 70.   .  She is pain-free at present.   She is on for heart catheterization tomorrow.   We have discussed the risks, benefits, options of heart catheterization.  She understands and agrees to proceed. She has a long history of cigarette  smoking.  She says that she has stopped smoking.   2.  Hyperlipidemia: The patient has atorvastatin 80 mg a day listed on her home medications.  She was not sure of whether or not she was taking it.  I checked with pharmacy and initiation of PCSK9 inhibitors is not available in the hospital.  We will continue atorvastatin for now.  Consider rosuvastatin.       For questions or updates, please contact [place] Please consult [email or web address] for contact info under        Signed, Mertie Moores, MD  06/22/2019, 1:09 PM

## 2019-06-22 NOTE — Progress Notes (Signed)
[place] for Heparin Indication: chest pain/ACS  Allergies  Allergen Reactions  . Benadryl [Diphenhydramine Hcl] Shortness Of Breath and Swelling    Tongue swelling  . Codeine Other (See Comments)    "makes me psychotic"   . Haldol [Haloperidol Decanoate] Anaphylaxis  . Amoxicillin Hives and Swelling    Swelling to extremities Did it involve swelling of the face/tongue/throat, SOB, or low BP? Yes Did it involve sudden or severe rash/hives, skin peeling, or any reaction on the inside of your mouth or nose? Yes Did you need to seek medical attention at a hospital or doctor's office? Yes When did it last happen?young adult If all above answers are "NO", may proceed with cephalosporin use.  . Bee Venom Swelling and Other (See Comments)    "pimples" from head to toe and whole body swelling    Patient Measurements: Height: 5\' 5"  (165.1 cm) Weight: 185 lb 14.4 oz (84.3 kg) IBW/kg (Calculated) : 57 Heparin Dosing Weight: 74.9 kg  Vital Signs: Temp: 97.7 F (36.5 C) (11/01 0623) Temp Source: Oral (11/01 0623) BP: 118/73 (11/01 0623) Pulse Rate: 54 (11/01 0623)  Labs: Recent Labs    06/20/19 1058 06/20/19 1300  06/21/19 0453 06/21/19 1415 06/22/19 0349  HGB 14.8  --   --  14.0  --  13.6  HCT 43.5  --   --  41.0  --  40.5  PLT 271  --   --  238  --  254  HEPARINUNFRC  --   --    < > 0.83* 0.26* 0.43  CREATININE 1.07*  --   --  1.06*  --   --   TROPONINIHS 24* 28*  --   --   --   --    < > = values in this interval not displayed.    Estimated Creatinine Clearance: 62.8 mL/min (A) (by C-G formula based on SCr of 1.06 mg/dL (H)).  Assessment: 58 yo female presented with chest pain. Pharmacy consulted to dose IV heparin for ACS.  Morning heparin level therapeutic. No infusion issues or bleeding noted.  Goal of Therapy:  Heparin level 0.3-0.7 units/ml Monitor platelets by anticoagulation protocol: Yes   Plan:  Continue  heparin 850 units/hour Daily heparin level and CBC    Thank you for allowing pharmacy to participate in this patient's care.  Delana Manganello L. Devin Going, [place] PGY1 Pharmacy Resident 06/22/19      7:40 AM  Please check AMION for all [place] phone numbers After 10:00 PM, call the [place] [phone]

## 2019-06-23 ENCOUNTER — Encounter (HOSPITAL_COMMUNITY)
Admission: EM | Disposition: A | Discharge status: Home / Self Care | Payer: Self-pay | Source: Home / Self Care | Attending: Internal Medicine

## 2019-06-23 ENCOUNTER — Encounter (HOSPITAL_COMMUNITY): Payer: Self-pay | Admitting: Cardiovascular Disease

## 2019-06-23 DIAGNOSIS — I25110 Atherosclerotic heart disease of native coronary artery with unstable angina pectoris: Principal | ICD-10-CM

## 2019-06-23 DIAGNOSIS — R079 Chest pain, unspecified: Secondary | ICD-10-CM

## 2019-06-23 DIAGNOSIS — I25710 Atherosclerosis of autologous vein coronary artery bypass graft(s) with unstable angina pectoris: Secondary | ICD-10-CM

## 2019-06-23 HISTORY — PX: LEFT HEART CATH AND CORS/GRAFTS ANGIOGRAPHY: CATH118250

## 2019-06-23 HISTORY — PX: CORONARY STENT INTERVENTION: CATH118234

## 2019-06-23 LAB — BASIC METABOLIC PANEL
Anion gap: 11 (ref 5–15)
BUN: 20 mg/dL (ref 6–20)
CO2: 23 mmol/L (ref 22–32)
Calcium: 9.7 mg/dL (ref 8.9–10.3)
Chloride: 107 mmol/L (ref 98–111)
Creatinine, Ser: 1.02 mg/dL — ABNORMAL HIGH (ref 0.44–1.00)
GFR calc Af Amer: >60 mL/min (ref >60)
GFR calc non Af Amer: >60 mL/min (ref >60)
Glucose, Bld: 108 mg/dL — ABNORMAL HIGH (ref 70–99)
Potassium: 3.9 mmol/L (ref 3.5–5.1)
Sodium: 141 mmol/L (ref 135–145)

## 2019-06-23 LAB — HEPARIN LEVEL (UNFRACTIONATED): Heparin Unfractionated: 0.46 IU/mL (ref 0.30–0.70)

## 2019-06-23 LAB — CBC
HCT: 41.1 % (ref 36.0–46.0)
Hemoglobin: 13.9 g/dL (ref 12.0–15.0)
MCH: 32.6 pg (ref 26.0–34.0)
MCHC: 33.8 g/dL (ref 30.0–36.0)
MCV: 96.3 fL (ref 80.0–100.0)
Platelets: 244 10*3/uL (ref 150–400)
RBC: 4.27 MIL/uL (ref 3.87–5.11)
RDW: 13.9 % (ref 11.5–15.5)
WBC: 9 10*3/uL (ref 4.0–10.5)
nRBC: 0 % (ref 0.0–0.2)

## 2019-06-23 LAB — PLATELET COUNT: Platelets: 241 10*3/uL (ref 150–400)

## 2019-06-23 LAB — MRSA PCR SCREENING: MRSA by PCR: NEGATIVE

## 2019-06-23 LAB — POCT ACTIVATED CLOTTING TIME
Activated Clotting Time: 329 seconds
Activated Clotting Time: 169 seconds

## 2019-06-23 SURGERY — LEFT HEART CATH AND CORS/GRAFTS ANGIOGRAPHY
Anesthesia: LOCAL

## 2019-06-23 MED ORDER — FAMOTIDINE IN NACL 20-0.9 MG/50ML-% IV SOLN
INTRAVENOUS | Status: AC
  Filled 2019-06-23: qty 50

## 2019-06-23 MED ORDER — MIDAZOLAM HCL 2 MG/2ML IJ SOLN
INTRAMUSCULAR | Status: DC | PRN
  Administered 2019-06-23: 1 mg via INTRAVENOUS

## 2019-06-23 MED ORDER — NITROGLYCERIN 1 MG/10 ML FOR IR/CATH LAB
INTRA_ARTERIAL | Status: AC
  Filled 2019-06-23: qty 10

## 2019-06-23 MED ORDER — SODIUM CHLORIDE 0.9 % IV SOLN
INTRAVENOUS | Status: DC | PRN
  Administered 2019-06-23: 1.75 mg/kg/h via INTRAVENOUS

## 2019-06-23 MED ORDER — CLOPIDOGREL BISULFATE 300 MG PO TABS
ORAL_TABLET | ORAL | Status: AC
  Filled 2019-06-23: qty 2

## 2019-06-23 MED ORDER — NITROGLYCERIN 1 MG/10 ML FOR IR/CATH LAB
INTRA_ARTERIAL | Status: DC | PRN
  Administered 2019-06-23: 200 ug

## 2019-06-23 MED ORDER — TIROFIBAN HCL IN NACL 5-0.9 MG/100ML-% IV SOLN
INTRAVENOUS | Status: AC
  Filled 2019-06-23: qty 100

## 2019-06-23 MED ORDER — LABETALOL HCL 5 MG/ML IV SOLN: 10.0000 mg | INTRAVENOUS | Status: AC | PRN

## 2019-06-23 MED ORDER — HEPARIN (PORCINE) IN NACL 1000-0.9 UT/500ML-% IV SOLN
INTRAVENOUS | Status: DC | PRN
  Administered 2019-06-23 (×2): 500 mL

## 2019-06-23 MED ORDER — NITROGLYCERIN IN D5W 200-5 MCG/ML-% IV SOLN
INTRAVENOUS | Status: AC | PRN
  Administered 2019-06-23: 3 ug/min via INTRAVENOUS

## 2019-06-23 MED ORDER — SODIUM CHLORIDE 0.9% FLUSH: 3.0000 mL | INTRAVENOUS | Status: DC | PRN

## 2019-06-23 MED ORDER — ESCITALOPRAM OXALATE 10 MG PO TABS
10.0000 mg | ORAL_TABLET | Freq: Every day | ORAL | Status: DC
  Administered 2019-06-23 – 2019-06-25 (×3): 10 mg via ORAL
  Filled 2019-06-23 (×3): qty 1

## 2019-06-23 MED ORDER — ONDANSETRON HCL 4 MG/2ML IJ SOLN: 4.0000 mg | Freq: Four times a day (QID) | INTRAMUSCULAR | Status: DC | PRN

## 2019-06-23 MED ORDER — CLOPIDOGREL BISULFATE 75 MG PO TABS
75.0000 mg | ORAL_TABLET | Freq: Every day | ORAL | Status: DC
  Administered 2019-06-24 – 2019-06-25 (×2): 75 mg via ORAL
  Filled 2019-06-23 (×2): qty 1

## 2019-06-23 MED ORDER — VERAPAMIL HCL 2.5 MG/ML IV SOLN
INTRAVENOUS | Status: AC
  Filled 2019-06-23: qty 2

## 2019-06-23 MED ORDER — HEPARIN (PORCINE) IN NACL 1000-0.9 UT/500ML-% IV SOLN
INTRAVENOUS | Status: AC
  Filled 2019-06-23: qty 1000

## 2019-06-23 MED ORDER — HYDRALAZINE HCL 20 MG/ML IJ SOLN
10.0000 mg | INTRAMUSCULAR | Status: AC | PRN
  Filled 2019-06-23: qty 1

## 2019-06-23 MED ORDER — BIVALIRUDIN BOLUS VIA INFUSION - CUPID
INTRAVENOUS | Status: DC | PRN
  Administered 2019-06-23: 63.075 mg via INTRAVENOUS

## 2019-06-23 MED ORDER — SODIUM CHLORIDE 0.9% FLUSH
3.0000 mL | Freq: Two times a day (BID) | INTRAVENOUS | Status: DC
  Administered 2019-06-23: 3 mL via INTRAVENOUS
  Administered 2019-06-24: 10:00:00 via INTRAVENOUS
  Administered 2019-06-25: 3 mL via INTRAVENOUS

## 2019-06-23 MED ORDER — ASPIRIN 81 MG PO CHEW: 81.0000 mg | CHEWABLE_TABLET | Freq: Every day | ORAL | Status: DC

## 2019-06-23 MED ORDER — FENTANYL CITRATE (PF) 100 MCG/2ML IJ SOLN
INTRAMUSCULAR | Status: AC
  Filled 2019-06-23: qty 2

## 2019-06-23 MED ORDER — SODIUM CHLORIDE 0.9 % IV SOLN: 250.0000 mL | INTRAVENOUS | Status: DC | PRN

## 2019-06-23 MED ORDER — ATORVASTATIN CALCIUM 80 MG PO TABS: 80.0000 mg | ORAL_TABLET | Freq: Every day | ORAL | Status: DC

## 2019-06-23 MED ORDER — SODIUM CHLORIDE 0.9 % IV SOLN
INTRAVENOUS | Status: AC
  Administered 2019-06-23: 18:00:00 via INTRAVENOUS

## 2019-06-23 MED ORDER — NITROGLYCERIN IN D5W 200-5 MCG/ML-% IV SOLN: 0.0000 ug/min | INTRAVENOUS | Status: AC

## 2019-06-23 MED ORDER — LIDOCAINE HCL (PF) 1 % IJ SOLN
INTRAMUSCULAR | Status: DC | PRN
  Administered 2019-06-23: 30 mL

## 2019-06-23 MED ORDER — FAMOTIDINE IN NACL 20-0.9 MG/50ML-% IV SOLN
INTRAVENOUS | Status: AC | PRN
  Administered 2019-06-23: 20 mg via INTRAVENOUS

## 2019-06-23 MED ORDER — TIROFIBAN (AGGRASTAT) BOLUS VIA INFUSION
INTRAVENOUS | Status: DC | PRN
  Administered 2019-06-23: 2102.5 ug via INTRAVENOUS

## 2019-06-23 MED ORDER — BIVALIRUDIN TRIFLUOROACETATE 250 MG IV SOLR
INTRAVENOUS | Status: AC
  Filled 2019-06-23: qty 250

## 2019-06-23 MED ORDER — ACETAMINOPHEN 325 MG PO TABS: 650.0000 mg | ORAL_TABLET | ORAL | Status: DC | PRN

## 2019-06-23 MED ORDER — CLOPIDOGREL BISULFATE 300 MG PO TABS
ORAL_TABLET | ORAL | Status: DC | PRN
  Administered 2019-06-23: 600 mg via ORAL

## 2019-06-23 MED ORDER — FENTANYL CITRATE (PF) 100 MCG/2ML IJ SOLN
INTRAMUSCULAR | Status: DC | PRN
  Administered 2019-06-23 (×3): 25 ug via INTRAVENOUS

## 2019-06-23 MED ORDER — CHLORHEXIDINE GLUCONATE CLOTH 2 % EX PADS
6.0000 | MEDICATED_PAD | Freq: Every day | CUTANEOUS | Status: DC
  Administered 2019-06-23: 6 via TOPICAL

## 2019-06-23 MED ORDER — MIDAZOLAM HCL 2 MG/2ML IJ SOLN
INTRAMUSCULAR | Status: AC
  Filled 2019-06-23: qty 2

## 2019-06-23 MED ORDER — ATROPINE SULFATE 1 MG/10ML IJ SOSY
PREFILLED_SYRINGE | INTRAMUSCULAR | Status: AC
  Filled 2019-06-23: qty 10

## 2019-06-23 MED ORDER — LAMOTRIGINE 150 MG PO TABS: 150.0000 mg | ORAL_TABLET | Freq: Every day | ORAL | Status: DC

## 2019-06-23 MED ORDER — TIROFIBAN HCL IN NACL 5-0.9 MG/100ML-% IV SOLN
INTRAVENOUS | Status: AC | PRN
  Administered 2019-06-23 (×2): 0.15 ug/kg/min via INTRAVENOUS

## 2019-06-23 MED ORDER — LIDOCAINE HCL (PF) 1 % IJ SOLN
INTRAMUSCULAR | Status: AC
  Filled 2019-06-23: qty 30

## 2019-06-23 MED ORDER — VERAPAMIL HCL 2.5 MG/ML IV SOLN
INTRAVENOUS | Status: DC | PRN
  Administered 2019-06-23 (×2): 100 ug via INTRACORONARY

## 2019-06-23 MED ORDER — TIROFIBAN HCL IN NACL 5-0.9 MG/100ML-% IV SOLN
0.1500 ug/kg/min | INTRAVENOUS | Status: AC
  Administered 2019-06-23 – 2019-06-24 (×2): 0.15 ug/kg/min via INTRAVENOUS
  Filled 2019-06-23 (×2): qty 100

## 2019-06-23 SURGICAL SUPPLY — 17 items
BALLN SAPPHIRE 2.0X12 (BALLOONS) ×2
BALLOON SAPPHIRE 2.0X12 (BALLOONS) IMPLANT
CATH DXT MULTI JL4 JR4 ANG PIG (CATHETERS) ×1 IMPLANT
CATH VISTA GUIDE 6FR JR4 (CATHETERS) ×1 IMPLANT
ELECT DEFIB PAD ADLT CADENCE (PAD) ×1 IMPLANT
KIT ENCORE 26 ADVANTAGE (KITS) ×1 IMPLANT
KIT HEART LEFT (KITS) ×2 IMPLANT
PACK CARDIAC CATHETERIZATION (CUSTOM PROCEDURE TRAY) ×2 IMPLANT
SHEATH PINNACLE 5F 10CM (SHEATH) ×1 IMPLANT
SHEATH PINNACLE 6F 10CM (SHEATH) ×1 IMPLANT
STENT RESOLUTE ONYX 3.5X22 (Permanent Stent) ×1 IMPLANT
STENT RESOLUTE ONYX 3.5X26 (Permanent Stent) ×1 IMPLANT
STENT SYNERGY DES 3.5X20 (Permanent Stent) ×1 IMPLANT
TRANSDUCER W/STOPCOCK (MISCELLANEOUS) ×2 IMPLANT
TUBING CIL FLEX 10 FLL-RA (TUBING) ×2 IMPLANT
WIRE ASAHI PROWATER 180CM (WIRE) ×1 IMPLANT
WIRE EMERALD 3MM-J .035X150CM (WIRE) ×2 IMPLANT

## 2019-06-23 NOTE — Progress Notes (Signed)
Assisted tele visit to patient with daughter.  Alver Sorrow, RN

## 2019-06-23 NOTE — Interval H&P Note (Signed)
Cath Lab Visit (complete for each Cath Lab visit)  Clinical Evaluation Leading to the Procedure:   ACS: Yes.    Non-ACS:    Anginal Classification: CCS III  Anti-ischemic medical therapy: Minimal Therapy (1 class of medications)  Non-Invasive Test Results: No non-invasive testing performed  Prior CABG: Previous CABG      History and Physical Interval Note:  06/23/2019 8:23 AM  [name]  has presented today for surgery, with the diagnosis of chest pain.  The various methods of treatment have been discussed with the patient and family. After consideration of risks, benefits and other options for treatment, the patient has consented to  Procedure(s): LEFT HEART CATH AND CORS/GRAFTS ANGIOGRAPHY (N/A) as a surgical intervention.  The patient's history has been reviewed, patient examined, no change in status, stable for surgery.  I have reviewed the patient's chart and labs.  Questions were answered to the patient's satisfaction.     Quay Burow

## 2019-06-23 NOTE — Progress Notes (Signed)
[place] [facility] for Tirofiban x 18 hrs Indication: chest pain/ACS > now s/p cath  Allergies  Allergen Reactions  . Benadryl [Diphenhydramine Hcl] Shortness Of Breath and Swelling    Tongue swelling  . Codeine Other (See Comments)    "makes me psychotic"   . Haldol [Haloperidol Decanoate] Anaphylaxis  . Amoxicillin Hives and Swelling    Swelling to extremities Did it involve swelling of the face/tongue/throat, SOB, or low BP? Yes Did it involve sudden or severe rash/hives, skin peeling, or any reaction on the inside of your mouth or nose? Yes Did you need to seek medical attention at a hospital or doctor's office? Yes When did it last happen?young adult If all above answers are "NO", may proceed with cephalosporin use.  . Bee Venom Swelling and Other (See Comments)    "pimples" from head to toe and whole body swelling    Patient Measurements: Height: 5\' 5"  (165.1 cm) Weight: 185 lb 8 oz (84.1 kg) IBW/kg (Calculated) : 57 Heparin Dosing Weight: 74.9 kg  Vital Signs: Temp: 97.6 F (36.4 C) (11/02 1100) Temp Source: Oral (11/02 1100) BP: 141/83 (11/02 1130) Pulse Rate: 54 (11/02 1130)  Labs: Recent Labs    06/21/19 0453 06/21/19 1415 06/22/19 0349 06/23/19 0434  HGB 14.0  --  13.6 13.9  HCT 41.0  --  40.5 41.1  PLT 238  --  254 244  HEPARINUNFRC 0.83* 0.26* 0.43 0.46  CREATININE 1.06*  --   --  1.02*    Estimated Creatinine Clearance: 65.1 mL/min (A) (by C-G formula based on SCr of 1.02 mg/dL (H)).  Assessment: 58 yo female presented with chest pain. Pharmacy consulted to dose IV heparin for ACS.  Morning heparin level therapeutic. No infusion issues or bleeding noted.  S/p cath lab this morning, 3 DES placed.  Pharmacy asked to continue tirofiban x 18 hrs.  Goal of Therapy:  Heparin level 0.3-0.7 units/ml Monitor platelets by anticoagulation protocol: Yes   Plan:  Continue tirofiban 0.15 mcg/kg/min x 18 hrs Platelet  count in 8 hrs.  Marguerite Olea, [facility] Clinical Pharmacist Phone [phone]  06/23/2019 12:40 PM

## 2019-06-23 NOTE — Progress Notes (Signed)
Right femoral arterial sheath removed at 1227. Manual pressure held for 20 mins. VSS. Patient educated on signs and symptoms of bleeding and to keep leg straight. Bedrest for 6 hours, up at 2027.

## 2019-06-23 NOTE — Progress Notes (Signed)
[facility] for Tirofiban x 18 hrs Indication: chest pain/ACS > now s/p cath  Allergies  Allergen Reactions  . Benadryl [Diphenhydramine Hcl] Shortness Of Breath and Swelling    Tongue swelling  . Codeine Other (See Comments)    "makes me psychotic"   . Haldol [Haloperidol Decanoate] Anaphylaxis  . Amoxicillin Hives and Swelling    Swelling to extremities Did it involve swelling of the face/tongue/throat, SOB, or low BP? Yes Did it involve sudden or severe rash/hives, skin peeling, or any reaction on the inside of your mouth or nose? Yes Did you need to seek medical attention at a hospital or doctor's office? Yes When did it last happen?young adult If all above answers are "NO", may proceed with cephalosporin use.  . Bee Venom Swelling and Other (See Comments)    "pimples" from head to toe and whole body swelling    Patient Measurements: Height: 5\' 5"  (165.1 cm) Weight: 185 lb 8 oz (84.1 kg) IBW/kg (Calculated) : 57 Heparin Dosing Weight: 74.9 kg  Vital Signs: Temp: 98.1 F (36.7 C) (11/02 1459) Temp Source: Oral (11/02 1459) BP: 112/77 (11/02 1715) Pulse Rate: 67 (11/02 1730)  Labs: Recent Labs    06/21/19 0453 06/21/19 1415 06/22/19 0349 06/23/19 0434 06/23/19 1618  HGB 14.0  --  13.6 13.9  --   HCT 41.0  --  40.5 41.1  --   PLT 238  --  254 244 241  HEPARINUNFRC 0.83* 0.26* 0.43 0.46  --   CREATININE 1.06*  --   --  1.02*  --     Estimated Creatinine Clearance: 65.1 mL/min (A) (by C-G formula based on SCr of 1.02 mg/dL (H)).  Assessment: 58 yo female presented with chest pain now s/p cath lab this morning, 3 DES placed. Pharmacy consulted to continue tirofiban x 18 hrs.   Platelets are stable and within normal limits.  No bleeding noted.   Goal of Therapy:  Monitor platelets by anticoagulation protocol: Yes   Plan:  Continue tirofiban 0.15 mcg/kg/min x 18 hrs Follow-up CBC in AM  Sloan Leiter, PharmD,  BCPS, BCCCP Clinical Pharmacist Clinical phone 06/23/2019 until 10:30P [phone] Please refer to [facility] for [place] numbers  06/23/2019 5:47 PM

## 2019-06-23 NOTE — Progress Notes (Signed)
Patient's mother Lujean Rave called per patient request to give update. Her phone number is [phone].

## 2019-06-23 NOTE — Plan of Care (Signed)

## 2019-06-24 ENCOUNTER — Inpatient Hospital Stay (HOSPITAL_COMMUNITY): Payer: Medicare Other

## 2019-06-24 DIAGNOSIS — E7801 Familial hypercholesterolemia: Secondary | ICD-10-CM

## 2019-06-24 LAB — BASIC METABOLIC PANEL
Anion gap: 12 (ref 5–15)
BUN: 10 mg/dL (ref 6–20)
CO2: 22 mmol/L (ref 22–32)
Calcium: 9.3 mg/dL (ref 8.9–10.3)
Chloride: 105 mmol/L (ref 98–111)
Creatinine, Ser: 1.1 mg/dL — ABNORMAL HIGH (ref 0.44–1.00)
GFR calc Af Amer: >60 mL/min (ref >60)
GFR calc non Af Amer: 56 mL/min — ABNORMAL LOW (ref >60)
Glucose, Bld: 100 mg/dL — ABNORMAL HIGH (ref 70–99)
Potassium: 3.7 mmol/L (ref 3.5–5.1)
Sodium: 139 mmol/L (ref 135–145)

## 2019-06-24 LAB — CBC
HCT: 37.3 % (ref 36.0–46.0)
Hemoglobin: 12.8 g/dL (ref 12.0–15.0)
MCH: 33.2 pg (ref 26.0–34.0)
MCHC: 34.3 g/dL (ref 30.0–36.0)
MCV: 96.6 fL (ref 80.0–100.0)
Platelets: 239 10*3/uL (ref 150–400)
RBC: 3.86 MIL/uL — ABNORMAL LOW (ref 3.87–5.11)
RDW: 14.2 % (ref 11.5–15.5)
WBC: 8.3 10*3/uL (ref 4.0–10.5)
nRBC: 0 % (ref 0.0–0.2)

## 2019-06-24 NOTE — Progress Notes (Signed)
Progress Note  Patient Name: [name] Date of Encounter: 06/24/2019  Primary Cardiologist: Gwenlyn Found  Subjective   No cardiovascular complaints.  Denies angina, getting ready to walk around the unit.  No problems at right femoral access site.   Has a persistent nagging headache that is been going on for months, which she relates to a few falls that she experienced at home.  She describes the headache as extending from the occipital area down the left side of her neck along the trapezius muscle.  Typically these falls have happened in the bathroom, getting off the commode, pattern suggestive of orthostatic hypotension.  At least one fall was associated with syncope and head injury. Also reports bradycardia at home, sometimes in the 30s. While on the monitor in the hospital has had occasional mild bradycardia in the high 40s during sleep, no bradycardia during daytime.  Inpatient Medications    Scheduled Meds: . aspirin  81 mg Oral Daily  . atorvastatin  80 mg Oral q1800  . brimonidine  1 drop Both Eyes BID  . Chlorhexidine Gluconate Cloth  6 each Topical Daily  . clopidogrel  75 mg Oral Q breakfast  . escitalopram  10 mg Oral Daily  . fluticasone furoate-vilanterol  1 puff Inhalation Daily  . influenza vac split quadrivalent PF  0.5 mL Intramuscular Tomorrow-1000  . lamoTRIgine  150 mg Oral Daily  . metoprolol tartrate  12.5 mg Oral BID  . niacin  1,000 mg Oral QHS  . nitroGLYCERIN  0.5 inch Topical Q6H  . omega-3 acid ethyl esters  1 capsule Oral BID  . QUEtiapine  150 mg Oral QHS  . sodium chloride flush  3 mL Intravenous Q12H   Continuous Infusions: . sodium chloride    . nitroGLYCERIN 40 mcg/min (06/23/19 1900)   PRN Meds: sodium chloride, acetaminophen, albuterol, nitroGLYCERIN, nitroGLYCERIN, ondansetron (ZOFRAN) IV, ondansetron (ZOFRAN) IV, sodium chloride flush   Vital Signs    Vitals:   06/24/19 0700 06/24/19 0722 06/24/19 0736 06/24/19 0800  BP:     136/64  Pulse: 84   (!) 52  Resp: 16   17  Temp:   (!) 97.5 F (36.4 C)   TempSrc:   Oral   SpO2: 98% 99%  97%  Weight:      Height:        Intake/Output Summary (Last 24 hours) at 06/24/2019 0901 Last data filed at 06/24/2019 0700 Gross per 24 hour  Intake 1443.36 ml  Output 400 ml  Net 1043.36 ml   Last 3 Weights 06/23/2019 06/22/2019 06/21/2019  Weight (lbs) 185 lb 8 oz 185 lb 14.4 oz 186 lb 4.8 oz  Weight (kg) 84.142 kg 84.324 kg 84.505 kg      Telemetry    NSR, mild sinus bradycardia- Personally Reviewed  ECG    Normal sinus rhythm, old Q waves of inferior infarct and tall R waves in V1-V2 of posterior infarct, nonspecific ST-T changes most obvious in leads I and aVL- Personally Reviewed  Physical Exam  Smiling, appears comfortable GEN: No acute distress.   Neck: No JVD.  Tender left trapezius muscle Cardiac: RRR, no murmurs, rubs, or gallops.  Respiratory: Clear to auscultation bilaterally. GI: Soft, nontender, non-distended  MS: No edema; No deformity.  The right groin arterial access site looks healthy without bleeding or hematoma Neuro:  Nonfocal  Psych: Normal affect   Labs    High Sensitivity Troponin:   Recent Labs  Lab 06/20/19 1058 06/20/19 1300  TROPONINIHS  24* 28*      Chemistry Recent Labs  Lab 06/21/19 0453 06/23/19 0434 06/24/19 0446  NA 139 141 139  K 3.8 3.9 3.7  CL 102 107 105  CO2 21* 23 22  GLUCOSE 88 108* 100*  BUN 20 20 10   CREATININE 1.06* 1.02* 1.10*  CALCIUM 9.5 9.7 9.3  GFRNONAA 58* >60 56*  GFRAA >60 >60 >60  ANIONGAP 16* 11 12     Hematology Recent Labs  Lab 06/22/19 0349 06/23/19 0434 06/23/19 1618 06/24/19 0446  WBC 9.8 9.0  --  8.3  RBC 4.12 4.27  --  3.86*  HGB 13.6 13.9  --  12.8  HCT 40.5 41.1  --  37.3  MCV 98.3 96.3  --  96.6  MCH 33.0 32.6  --  33.2  MCHC 33.6 33.8  --  34.3  RDW 14.2 13.9  --  14.2  PLT 254 244 241 239    BNPNo results for input(s): BNP, PROBNP in the last 168 hours.    DDimer No results for input(s): DDIMER in the last 168 hours.   Radiology    No results found.  Cardiac Studies   Cardiac cath 06/23/2019 Diagnostic Dominance: Right  Intervention    IMPRESSION: Successful PDA SVG PCI drug-eluting stenting deploying 3 drug-eluting stents (3.5 x 20 Synergy, 3.5 x 26 and 22 resolute Onyx) with ultimate resolution of her no reflow and covering of the diseased segment as well as the thrombotic lesions both proximal and distal to the original stent.  It is unclear what caused the thrombotic lesions.  She was having some chest pain at the end of the procedure which I suspect will gradually improve.  EKG showed no acute changes.  The Angiomax was turned off and the Aggrastat continues for 18 hours.  She will need dual antiplatelet therapy uninterrupted for 12 months per guideline directed medical therapy.  Echo 06/20/2019:  1. Left ventricular ejection fraction, by visual estimation, is 60 to 65%. The left ventricle has normal function. There is mildly increased left ventricular hypertrophy.  2. Elevated left ventricular end-diastolic pressure.  3. Left ventricular diastolic parameters are consistent with Grade I diastolic dysfunction (impaired relaxation).  4. Global right ventricle has normal systolic function.The right ventricular size is normal. No increase in right ventricular wall thickness.  5. Left atrial size was normal.  6. Right atrial size was normal.  7. The mitral valve is normal in structure. Mild mitral valve regurgitation. No evidence of mitral stenosis.  8. The tricuspid valve is normal in structure. Tricuspid valve regurgitation is trivial.  9. The aortic valve is normal in structure. Aortic valve regurgitation is not visualized. No evidence of aortic valve sclerosis or stenosis. 10. The pulmonic valve was normal in structure. Pulmonic valve regurgitation is not visualized. 11. The inferior vena cava is normal in size with greater than 50%  respiratory variability, suggesting right atrial pressure of 3 mmHg.   Patient Profile     58 y.o. female with premature onset CAD, status post CABG at age 67, status post drug-eluting stent x3 to the SVG to RCA on this admission after presentation with unstable angina, severe hypercholesterolemia (almost certainly heterozygous familial hypercholesterolemia with LDL of 298).  Assessment & Plan    1. CAD s/p CABG s/p DES x 3 to SVG-RCA: Transient no reflow associated with chest pain, she has completed overnight infusion of tirofiban.  He is consistent with extensive old infarction in the RCA territory, but regional wall motion  and overall LV systolic function are well preserved.  Mandatory dual antiplatelet therapy for at least the next 12 months, consider lifelong antiplatelet therapy. 2. HFH: Elevated total cholesterol and LDL cholesterol consistent with HFH.  She will need PCSK9 inhibitors.  Currently on high-dose atorvastatin.  Refer to lipid clinic at discharge.  She is on Lovaza currently, will replace this with Vascepa after DC. 3.  Falls: She appears to be describing orthostatic hypotension.  Lisinopril has been discontinued.  Blood pressure is currently in normal range.  Bradycardia reported at home, but only mild bradycardia documented in the hospital.  We will continue the very low-dose of beta-blocker that she is on. 4.  Headache: Hopefully she  just has a strained left trapezius muscle, but since she is on aggressive antiplatelet therapy, I think we should check a head CT without contrast to exclude subdural hematoma.  For questions or updates, please contact [place] Please consult [email or web address] for contact info under        Signed, Sanda Klein, MD  06/24/2019, 9:01 AM

## 2019-06-24 NOTE — Progress Notes (Signed)
Assisted tele visit to patient with family member.  [name], Marvin R, RN  

## 2019-06-24 NOTE — Progress Notes (Addendum)
[id number] Pt stated she walked 370 ft without CP and has washed up. Reviewed NTG use, plavix with stents, watching carbs with A1C of 6.1 , smoking cessation and walking for ex. Pt voiced understanding. Pt plans to quit smoking as she has quit several times and restarted when around friends. Gave smoking cessation handout. Pt has attended CRP 2 twice. Referred to [place]. Did not walk with pt again as Radiology here for CT. Will see tomorrow if not d/ced. Pt is interested in participating in Virtual Cardiac and Pulmonary Rehab. Pt advised that Virtual Cardiac and Pulmonary Rehab is provided at no cost to the patient.  Checklist:  1. Pt has smart device  ie smartphone and/or ipad for downloading an app  Yes 2. Reliable internet/wifi service    Yes 3. Understands how to use their smartphone and navigate within an app.  Yes   Pt verbalized understanding and is in agreement.    Graylon Good RN BSN 06/24/2019 11:06 AM

## 2019-06-25 DIAGNOSIS — I25700 Atherosclerosis of coronary artery bypass graft(s), unspecified, with unstable angina pectoris: Secondary | ICD-10-CM

## 2019-06-25 LAB — CBC
HCT: 38.2 % (ref 36.0–46.0)
Hemoglobin: 13 g/dL (ref 12.0–15.0)
MCH: 32.9 pg (ref 26.0–34.0)
MCHC: 34 g/dL (ref 30.0–36.0)
MCV: 96.7 fL (ref 80.0–100.0)
Platelets: 227 10*3/uL (ref 150–400)
RBC: 3.95 MIL/uL (ref 3.87–5.11)
RDW: 14 % (ref 11.5–15.5)
WBC: 7.5 10*3/uL (ref 4.0–10.5)
nRBC: 0 % (ref 0.0–0.2)

## 2019-06-25 MED ORDER — METOPROLOL TARTRATE 25 MG PO TABS: 12.5000 mg | ORAL_TABLET | Freq: Two times a day (BID) | ORAL | 6 refills | Status: DC

## 2019-06-25 MED ORDER — CLOPIDOGREL BISULFATE 75 MG PO TABS: 75.0000 mg | ORAL_TABLET | Freq: Every day | ORAL | 3 refills | Status: DC

## 2019-06-25 MED ORDER — VASCEPA 1 G PO CAPS: 2.0000 | ORAL_CAPSULE | Freq: Two times a day (BID) | ORAL | 1 refills | Status: DC

## 2019-06-25 NOTE — Plan of Care (Signed)

## 2019-06-25 NOTE — Progress Notes (Signed)
CARDIAC REHAB PHASE I   PRE:  Rate/Rhythm: 69 SR  BP:  Supine:   Sitting: 147/85  Standing:    SaO2: 96%RA  MODE:  Ambulation: 700 ft   POST:  Rate/Rhythm: 77 SR  BP:  Supine:   Sitting: 129/94  Standing:    SaO2: 97%RA [id number] Pt walked 700 ft on RA with steady gait. Has been up walking independently without CP. Hoping to go home.   Graylon Good, RN BSN  06/25/2019 9:50 AM

## 2019-06-25 NOTE — Progress Notes (Addendum)
Progress Note  Patient Name: [name] Date of Encounter: 06/25/2019  Primary Cardiologist: No primary care provider on file.   Subjective   Denies recurrent chest pain or sob. Patient ambulating without symptoms. Cath right right groin clean and dry. Possible discharge.   Inpatient Medications    Scheduled Meds:  aspirin  81 mg Oral Daily   atorvastatin  80 mg Oral q1800   brimonidine  1 drop Both Eyes BID   Chlorhexidine Gluconate Cloth  6 each Topical Daily   clopidogrel  75 mg Oral Q breakfast   escitalopram  10 mg Oral Daily   fluticasone furoate-vilanterol  1 puff Inhalation Daily   influenza vac split quadrivalent PF  0.5 mL Intramuscular Tomorrow-1000   lamoTRIgine  150 mg Oral Daily   metoprolol tartrate  12.5 mg Oral BID   niacin  1,000 mg Oral QHS   omega-3 acid ethyl esters  1 capsule Oral BID   QUEtiapine  150 mg Oral QHS   sodium chloride flush  3 mL Intravenous Q12H   Continuous Infusions:  sodium chloride     PRN Meds: sodium chloride, acetaminophen, albuterol, nitroGLYCERIN, ondansetron (ZOFRAN) IV, ondansetron (ZOFRAN) IV, sodium chloride flush   Vital Signs    Vitals:   06/24/19 1150 06/24/19 1400 06/24/19 1959 06/25/19 0543  BP:  (!) 149/74 (!) 148/83 119/68  Pulse:   (!) 53 64  Resp:   18   Temp: 98.2 F (36.8 C)  98.6 F (37 C) 98.1 F (36.7 C)  TempSrc: Oral  Oral Oral  SpO2:   97% 98%  Weight:    83.5 kg  Height:        Intake/Output Summary (Last 24 hours) at 06/25/2019 0648 Last data filed at 06/24/2019 2300 Gross per 24 hour  Intake 720 ml  Output --  Net 720 ml   Last 3 Weights 06/25/2019 06/23/2019 06/22/2019  Weight (lbs) 184 lb 1.6 oz 185 lb 8 oz 185 lb 14.4 oz  Weight (kg) 83.507 kg 84.142 kg 84.324 kg      Telemetry    NSR with some sinus bradycardia mainly at night; HR 60-70s; evnts of first degree AV block PRI 0.27s - Personally Reviewed  ECG    No new - Personally Reviewed  Physical Exam    GEN: No acute distress.   Neck: No JVD Cardiac: RRR, no murmurs, rubs, or gallops.  Respiratory: Clear to auscultation bilaterally. GI: Soft, nontender, non-distended  MS: No edema; No deformity; right groin site clean and dry with no signs of hematoma Neuro:  Nonfocal  Psych: Normal affect   Labs    High Sensitivity Troponin:   Recent Labs  Lab 06/20/19 1058 06/20/19 1300  TROPONINIHS 24* 28*      Chemistry Recent Labs  Lab 06/21/19 0453 06/23/19 0434 06/24/19 0446  NA 139 141 139  K 3.8 3.9 3.7  CL 102 107 105  CO2 21* 23 22  GLUCOSE 88 108* 100*  BUN 20 20 10   CREATININE 1.06* 1.02* 1.10*  CALCIUM 9.5 9.7 9.3  GFRNONAA 58* >60 56*  GFRAA >60 >60 >60  ANIONGAP 16* 11 12     Hematology Recent Labs  Lab 06/23/19 0434 06/23/19 1618 06/24/19 0446 06/25/19 0259  WBC 9.0  --  8.3 7.5  RBC 4.27  --  3.86* 3.95  HGB 13.9  --  12.8 13.0  HCT 41.1  --  37.3 38.2  MCV 96.3  --  96.6 96.7  MCH  32.6  --  33.2 32.9  MCHC 33.8  --  34.3 34.0  RDW 13.9  --  14.2 14.0  PLT 244 241 239 227    BNPNo results for input(s): BNP, PROBNP in the last 168 hours.   DDimer No results for input(s): DDIMER in the last 168 hours.   Radiology    Ct Head Wo Contrast  Result Date: 06/24/2019 CLINICAL DATA:  Persistent headache following fall several weeks prior EXAM: CT HEAD WITHOUT CONTRAST TECHNIQUE: Contiguous axial images were obtained from the base of the skull through the vertex without intravenous contrast. COMPARISON:  June 24, 2014 FINDINGS: Brain: The ventricles are normal in size and configuration. There is no intracranial mass, hemorrhage, extra-axial fluid collection, or midline shift. Brain parenchyma appears unremarkable. There is no demonstrable acute infarct. Vascular: There is no hyperdense vessel. There is calcification in each carotid siphon region. Skull: The bony calvarium appears intact. Sinuses/Orbits: There is mucosal thickening in several ethmoid air  cells. Other visualized paranasal sinuses are clear. Orbits appear symmetric bilaterally. Other: Mastoid air cells are clear. IMPRESSION: 1. Brain parenchyma appears normal. No acute infarct. No mass or hemorrhage. 2.  There are foci of arterial vascular calcification. 3.  There is mucosal thickening in several ethmoid air cells. Electronically Signed   By: Lowella Grip III M.D.   On: 06/24/2019 11:22    Cardiac Studies   LCH/Coronary stent intervention 06/23/19  Prox Graft to Mid Graft lesion is 50% stenosed.  Dist Graft lesion is 95% stenosed.  Prox LAD to Mid LAD lesion is 100% stenosed.  Prox RCA lesion is 90% stenosed.  Mid RCA lesion is 90% stenosed.  Dist RCA lesion is 100% stenosed.  Origin to Mid Graft lesion is 50% stenosed.  A drug-eluting stent was successfully placed.  Post intervention, there is a 0% residual stenosis.   [name] is a 58 y.o. female    Intervention    IMPRESSION:Successful PDA SVG PCI drug-eluting stenting deploying 3 drug-eluting stents (3.5 x 20 Synergy, 3.5 x 26 and 22 resolute Onyx) with ultimate resolution of her no reflow and covering of the diseased segment as well as the thrombotic lesions both proximal and distal to the original stent. It is unclear what caused the thrombotic lesions. She was having some chest pain at the end of the procedure which I suspect will gradually improve. EKG showed no acute changes. The Angiomax was turned off and the Aggrastat continues for 18 hours. She will need dual antiplatelet therapy uninterrupted for 12 months per guideline directed medical therapy.  Echo 06/20/2019: 1. Left ventricular ejection fraction, by visual estimation, is 60 to 65%. The left ventricle has normal function. There is mildly increased left ventricular hypertrophy. 2. Elevated left ventricular end-diastolic pressure. 3. Left ventricular diastolic parameters are consistent with Grade I diastolic dysfunction  (impaired relaxation). 4. Global right ventricle has normal systolic function.The right ventricular size is normal. No increase in right ventricular wall thickness. 5. Left atrial size was normal. 6. Right atrial size was normal. 7. The mitral valve is normal in structure. Mild mitral valve regurgitation. No evidence of mitral stenosis. 8. The tricuspid valve is normal in structure. Tricuspid valve regurgitation is trivial. 9. The aortic valve is normal in structure. Aortic valve regurgitation is not visualized. No evidence of aortic valve sclerosis or stenosis. 10. The pulmonic valve was normal in structure. Pulmonic valve regurgitation is not visualized. 11. The inferior vena cava is normal in size with greater than  50% respiratory variability, suggesting right atrial pressure of 3 mmHg.  Patient Profile     58 y.o. female with premature onset CAD, status post CABG at age 66, status post drug-eluting stent x3 to the SVG to RCA on this admission after presentation with unstable angina, severe hypercholesterolemia (almost certainly heterozygous familial hypercholesterolemia with LDL of 298).  Assessment & Plan    CAD s/p CABG s/p DES x 3  - Patient had cath yesterday and PCI DES x 3 SVG- RPDA - Patient has not had recurrent chest pain or SOB - Echo this admission showed EF 60-65%, increased LVH, G1DD - continue with DAPT, plavix and ASA x 12 months - Cath site right groin is clean and dry with no signs of hematoma - Patient working with cardiac rehab and able to walk without symptoms - continue Lopressor 12.5mg  BID - Patient will need lipid clinic referral at discharge - creatinine up today, 1.02 > 1.10. will need outpatient labs. Hgb stable - Possible discharge today  [place] - LDL 298, chol 281 - Home med atorvastatin 80 mg daily - Patient will nee lipid clinic referral for PCSK9I - Plan to replace Lovaza with Vascepa at discharge  Falls/orthostatic hypotension - lisinopril  d/c's this admission - Pressure normotensive, 119/49 - continue low dose BB>> does have some bradycardia to high 40s  Headache - CT head showed no acute infarct, mass or hemorrhage - Further work-up outpatient  H/o tobacco abuse - Recently quit a week ago  For questions or updates, please contact [facility] Please consult [email or web address] for contact info under        Signed, Cadence Ninfa Meeker, PA-C  06/25/2019, 6:48 AM    I have seen and examined the patient along with Cadence Ninfa Meeker, PA-C .  I have reviewed the chart, notes and new data.  I agree with PA/NP's note.  Key new complaints: no angina or dyspnea walking Key examination changes: no bleeding at arterial access site. DBP is low. Key new findings / data: CT without major abnormality.  PLAN: Stopped ACEi. Mandatory DAPT for 12 months. Lipid clinic referral. Long term probably needs statin + PCSK9inh + Vascepa. Critical to not restart smoking. DC today. Virtual cardiac rehab.   Sanda Klein, MD, [place] [phone] 06/25/2019, 10:51 AM

## 2019-06-25 NOTE — Care Management Important Message (Signed)
Important Message  Patient Details  Name: [name] MRN: [id number] Date of Birth: May 20, 1961   Medicare Important Message Given:  Yes     Shelda Altes 06/25/2019, 1:24 PM

## 2019-06-25 NOTE — Discharge Summary (Signed)
Discharge Summary    Patient ID: KEYSTAL Walsh MRN: [id number]; DOB: 12-05-1960  Admit date: 06/20/2019 Discharge date: 06/25/2019  Primary Care Provider: Macy Mis, MD  Primary Cardiologist: No primary care provider on file.  Primary Electrophysiologist:  None   Discharge Diagnoses    Active Problems:   ACS (acute coronary syndrome) [facility])   Cardiac chest pain    Diagnostic Studies/Procedures    LCH/Coronary stent intervention 06/23/19  Prox Graft to Mid Graft lesion is 50% stenosed.  Dist Graft lesion is 95% stenosed.  Prox LAD to Mid LAD lesion is 100% stenosed.  Prox RCA lesion is 90% stenosed.  Mid RCA lesion is 90% stenosed.  Dist RCA lesion is 100% stenosed.  Origin to Mid Graft lesion is 50% stenosed.  A drug-eluting stent was successfully placed.  Post intervention, there is a 0% residual stenosis.  Angela Labrador Jeffersonis a 58 y.o.female    Intervention    IMPRESSION:Successful PDA SVG PCI drug-eluting stenting deploying 3 drug-eluting stents (3.5 x 20 Synergy, 3.5 x 26 and 22 resolute Onyx) with ultimate resolution of her no reflow and covering of the diseased segment as well as the thrombotic lesions both proximal and distal to the original stent. It is unclear what caused the thrombotic lesions. She was having some chest pain at the end of the procedure which I suspect will gradually improve. EKG showed no acute changes. The Angiomax was turned off and the Aggrastat continues for 18 hours. She will need dual antiplatelet therapy uninterrupted for 12 months per guideline directed medical therapy. _____________   History of Present Illness     [name] is a 58 y.o. female with a history of CAD s/p CBG in 2008 w/ LIMA to LAD, SVG to Ramus and SVG to PDA, hyperlipidemia, and hypertension, h/o of tobacco abuse who presented to the ED 06/20/19. The patient was in her usual state of health when she started experiencing chest  pressure 4-5 days prior. The pain was a heaviness located in the substernal area and radiating up into her jaw. 10/10 level pain. She had associated SOB as well. It was intermittent lasting about 3-5 minutes and was relieved by rest. She had NTG at home but did not take it because they had expired. She felt this pain was similar to previous unstable angina so she went into the ED.   Hospital Course     Consultants: None  In the ED the patient received NTG which completely relieved the chest pain. She also noted she had a syncopal episode about 4 months ago and has had a posterior headache since then. Labs remarkable for creatinine 1.07, glucose 154, WBC 15.4. HS troponin was 24 > 28. COVID negative. EKG with no acute ischemic changes. The patient was admitted for cardiac catheterization.  Lipids came back at LDL 298, total cholesterol 381, andTG 185 despite being on high dose statin. Echo showed EF 60-65%, mild LVH, and G1DD. The patient was taken to the cath lab for a left heart cath which showed 50% stenosis prox to mid graft, 95% stenosis of dist graft lesion, 100% stenosis of prox LAD to mid LAD, 90% stenosis to Prox RCA, 90% stenosis mid RCA, 100% stenosis distal RCA, 50% stenosis of origin to mid graft. Patient underwent successful PDA SVG PCI DES x 3. She was bolused with Aggrastat and remained on that for the next 18 hours. Post cath recommendations include DAPT with ASA and Plavix for 12 months. Patient remained stable  throughout the procedure and was taken back to the nursing unit. She had no complications with the right femoral access site. Due to her persistent headache a CT head w/o contrast was ordered which showed no acute changes. Patient also seemed to describe falls which sounded like orthostatic hypotension resulting in the discontinuation of lisinopril. Due to bradycardia in the 40s while sleeping the BB was decreased to metoprolol 12.5mg  twice daily. The patient remained stable and had no  recurrent chest pain. She worked with cardiac rehab and ambulated without symptoms. Will plan to discharge patient on DAPT Aspirin and Plavix for 1 year. 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 Will discharge on lower dose of BB, metoprolol 12.5mg  twice daily. Recommended patient not restart smoking. Plan for virtual cardiac rehab as outpatient.   The patient was seen and examined on 06/25/19 by Dr. Royann Shivers and felt to be stable for discharge. Follow up was arranged.   Did the patient have an acute coronary syndrome (MI, NSTEMI, STEMI, etc) this admission?:  No                               Did the patient have a percutaneous coronary intervention (stent / angioplasty)?:  Yes.     Cath/PCI Registry Performance & Quality Measures: 1. Aspirin prescribed? - Yes 2. ADP Receptor Inhibitor (Plavix/Clopidogrel, Brilinta/Ticagrelor or Effient/Prasugrel) prescribed (includes medically managed patients)? - Yes 3. High Intensity Statin (Lipitor 40-80mg  or Crestor 20-40mg ) prescribed? - Yes 4. For EF <40%, was ACEI/ARB prescribed? - Not Applicable (EF >/= 40%) 5. For EF <40%, Aldosterone Antagonist (Spironolactone or Eplerenone) prescribed? - Not Applicable (EF >/= 40%) 6. Cardiac Rehab Phase II ordered (Included Medically managed Patients)? - Yes   _____________  Discharge Vitals Blood pressure (!) 132/93, pulse 70, temperature 98.1 F (36.7 C), temperature source Oral, resp. rate 18, height 5\' 5"  (1.651 m), weight 83.5 kg, last menstrual period 11/16/2011, SpO2 98 %.  Filed Weights   06/22/19 0623 06/23/19 0539 06/25/19 0543  Weight: 84.3 kg 84.1 kg 83.5 kg    Labs & Radiologic Studies    CBC Recent Labs    06/24/19 0446 06/25/19 0259  WBC 8.3 7.5  HGB 12.8 13.0  HCT 37.3 38.2  MCV 96.6 96.7  PLT 239 227   Basic Metabolic Panel Recent Labs    40/98/11 0434  06/24/19 0446  NA 141 139  K 3.9 3.7  CL 107 105  CO2 23 22  GLUCOSE 108* 100*  BUN 20 10  CREATININE 1.02* 1.10*  CALCIUM 9.7 9.3   Liver Function Tests No results for input(s): AST, ALT, ALKPHOS, BILITOT, PROT, ALBUMIN in the last 72 hours. No results for input(s): LIPASE, AMYLASE in the last 72 hours. High Sensitivity Troponin:   Recent Labs  Lab 06/20/19 1058 06/20/19 1300  TROPONINIHS 24* 28*    BNP Invalid input(s): POCBNP D-Dimer No results for input(s): DDIMER in the last 72 hours. Hemoglobin A1C No results for input(s): HGBA1C in the last 72 hours. Fasting Lipid Panel No results for input(s): CHOL, HDL, LDLCALC, TRIG, CHOLHDL, LDLDIRECT in the last 72 hours. Thyroid Function Tests No results for input(s): TSH, T4TOTAL, T3FREE, THYROIDAB in the last 72 hours.  Invalid input(s): FREET3 _____________  Ct Head Wo Contrast  Result Date: 06/24/2019 CLINICAL DATA:  Persistent headache following fall several  weeks prior EXAM: CT HEAD WITHOUT CONTRAST TECHNIQUE: Contiguous axial images were obtained from the base of the skull through the vertex without intravenous contrast. COMPARISON:  June 24, 2014 FINDINGS: Brain: The ventricles are normal in size and configuration. There is no intracranial mass, hemorrhage, extra-axial fluid collection, or midline shift. Brain parenchyma appears unremarkable. There is no demonstrable acute infarct. Vascular: There is no hyperdense vessel. There is calcification in each carotid siphon region. Skull: The bony calvarium appears intact. Sinuses/Orbits: There is mucosal thickening in several ethmoid air cells. Other visualized paranasal sinuses are clear. Orbits appear symmetric bilaterally. Other: Mastoid air cells are clear. IMPRESSION: 1. Brain parenchyma appears normal. No acute infarct. No mass or hemorrhage. 2.  There are foci of arterial vascular calcification. 3.  There is mucosal thickening in several ethmoid air cells. Electronically  Signed   By: Bretta Bang III M.D.   On: 06/24/2019 11:22   Dg Chest Portable 1 View  Result Date: 06/20/2019 CLINICAL DATA:  Chest pain EXAM: PORTABLE CHEST 1 VIEW COMPARISON:  02/11/2016 FINDINGS: Cardiomegaly status post median sternotomy and CABG with numerous fractured sternotomy wires. Both lungs are clear. The visualized skeletal structures are unremarkable. IMPRESSION: 1. Cardiomegaly without acute abnormality of the lungs in AP portable projection. 2. Numerous fractured sternotomy wires, which may be symptomatic although unchanged compared to prior examination. Electronically Signed   By: Lauralyn Primes M.D.   On: 06/20/2019 11:17   Disposition   Pt is being discharged home today in good condition.  Follow-up Plans & Appointments    Follow-up Information    Abelino Derrick, PA-C Follow up on 07/03/2019.   Specialties: Cardiology, Radiology Why: Please go to hospital follow-up November 12th at 9:00 AM Contact information: [address] [place] [address] [phone]          Discharge Instructions    AMB Referral to Advanced Lipid Disorders Clinic   Complete by: As directed    Reason for referral: Family history of suspicious for genetic dyslipidemia desiring genetic testing   Internal Lipid Clinic Referral Scheduling  Internal lipid clinic referrals are providers within [facility], who wish to refer established patients for routine management (help in starting PCSK9 inhibitor therapy) or advanced therapies.  Internal MD referral criteria:              1. All patients with LDL>190 mg/dL  2. All patients with Triglycerides >500 mg/dL  3. Patients with suspected or confirmed heterozygous familial hyperlipidemia (HeFH) or homozygous familial hyperlipidemia (HoFH)  4. Patients with family history of suspicious for genetic dyslipidemia desiring genetic testing  5. Patients refractory to standard guideline based therapy  6. Patients with statin intolerance  (failed 2 statins, one of which must be a high potency statin)  7. Patients who the provider desires to be seen by MD   Internal PharmD referral criteria:   1. Follow-up patients for medication management  2. Follow-up for compliance monitoring  3. Patients for drug education  4. Patients with statin intolerance  5. PCSK9 inhibitor education and prior authorization approvals  6. Patients with triglycerides <500 mg/dL  External Lipid Clinic Referral  External lipid clinic referrals are for providers outside of [facility], considered new clinic patients - automatically routed to MD schedule   Amb Referral to Cardiac Rehabilitation   Complete by: As directed    Diagnosis: Coronary Stents   After initial evaluation and assessments completed: Virtual Based Care may be provided alone or in conjunction with  Phase 2 Cardiac Rehab based on patient barriers.: Yes      Discharge Medications   Allergies as of 06/25/2019      Reactions   Benadryl [diphenhydramine Hcl] Shortness Of Breath, Swelling   Tongue swelling   Codeine Other (See Comments)   "makes me psychotic"   Haldol [haloperidol Decanoate] Anaphylaxis   Amoxicillin Hives, Swelling   Swelling to extremities Did it involve swelling of the face/tongue/throat, SOB, or low BP? Yes Did it involve sudden or severe rash/hives, skin peeling, or any reaction on the inside of your mouth or nose? Yes Did you need to seek medical attention at a hospital or doctor's office? Yes When did it last happen?young adult If all above answers are NO, may proceed with cephalosporin use.   Bee Venom Swelling, Other (See Comments)   "pimples" from head to toe and whole body swelling      Medication List    STOP taking these medications   lisinopril 10 MG tablet Commonly known as: ZESTRIL     TAKE these medications   albuterol 108 (90 Base) MCG/ACT inhaler Commonly known as: VENTOLIN HFA Inhale 2 puffs into the lungs every 6 (six)  hours as needed for wheezing or shortness of breath.   aspirin 81 MG chewable tablet Chew 81 mg by mouth daily.   atorvastatin 80 MG tablet Commonly known as: LIPITOR Take 1 tablet (80 mg total) by mouth daily at 6 PM. Need to make an appt for overdue visit   brimonidine 0.15 % ophthalmic solution Commonly known as: ALPHAGAN Place 1 drop into both eyes 2 (two) times daily.   clopidogrel 75 MG tablet Commonly known as: PLAVIX Take 1 tablet (75 mg total) by mouth daily with breakfast. Start taking on: June 26, 2019   escitalopram 10 MG tablet Commonly known as: LEXAPRO Take 10 mg by mouth daily.   fluticasone 50 MCG/ACT nasal spray Commonly known as: FLONASE Place 1 spray into both nostrils daily.   Fluticasone-Salmeterol 250-50 MCG/DOSE Aepb Commonly known as: ADVAIR Inhale 1 puff into the lungs 2 (two) times daily.   isosorbide mononitrate 30 MG 24 hr tablet Commonly known as: IMDUR TAKE 1 TABLET (30 MG TOTAL) BY MOUTH DAILY. NEED OFFICE VISIT. What changed: See the new instructions.   lamoTRIgine 150 MG tablet Commonly known as: LAMICTAL Take 150 mg by mouth daily.   metoprolol tartrate 25 MG tablet Commonly known as: LOPRESSOR Take 0.5 tablets (12.5 mg total) by mouth 2 (two) times daily.   niacin 1000 MG CR tablet Commonly known as: NIASPAN TAKE 1 TABLET (1,000 MG TOTAL) BY MOUTH AT BEDTIME. What changed: See the new instructions.   nitroGLYCERIN 0.4 MG SL tablet Commonly known as: NITROSTAT Place 1 tablet (0.4 mg total) under the tongue every 5 (five) minutes as needed. What changed: reasons to take this   omega-3 acid ethyl esters 1 g capsule Commonly known as: LOVAZA Take 1 capsule (1 g total) by mouth 2 (two) times daily.   QUEtiapine 200 MG tablet Commonly known as: SEROQUEL Take 100 mg by mouth at bedtime.   Vascepa 1 g Caps Generic drug: Icosapent Ethyl Take 2 capsules (2 g total) by mouth 2 (two) times daily.          Outstanding  Labs/Studies   None  Duration of Discharge Encounter   Greater than 30 minutes including physician time.  Signed, Raeanne Deschler David Stall, PA-C 06/25/2019, 12:55 PM

## 2019-06-30 ENCOUNTER — Telehealth (HOSPITAL_COMMUNITY): Payer: Self-pay

## 2019-06-30 ENCOUNTER — Ambulatory Visit (INDEPENDENT_AMBULATORY_CARE_PROVIDER_SITE_OTHER): Payer: Medicare Other

## 2019-06-30 ENCOUNTER — Other Ambulatory Visit: Payer: Self-pay

## 2019-06-30 DIAGNOSIS — E785 Hyperlipidemia, unspecified: Secondary | ICD-10-CM

## 2019-06-30 DIAGNOSIS — I10 Essential (primary) hypertension: Secondary | ICD-10-CM

## 2019-06-30 MED ORDER — ATORVASTATIN CALCIUM 80 MG PO TABS: 80.0000 mg | ORAL_TABLET | Freq: Every day | ORAL | 11 refills | Status: DC

## 2019-06-30 NOTE — Progress Notes (Signed)
Patient ID: [name]                 DOB: April 18, 1961                    MRN: [id number]     HPI: [name] is a 58 y.o. female patient referred to lipid clinic by Dr. Sallyanne Walsh. PMH is significant for CAD s/p CABG in 2008 w/ LIMA to LAD, SVG to Ramus and SVG to PDA, hyperlipidemia, and hypertension, and h/o of tobacco abuse. She was recently admitted on 06/20/19 for ACS and received 3 DES to PDA SVG. At discharge, her Lovaza was switched to [place].  She presents today for initial visit to lipid clinic in good spirits. She says that after being discharged from the hospital she has completely changed her lifestyle. Her family members have thrown away all canned and processed foods, red meats, etc. She has began walking daily, adding 5 minutes onto each walk per day. She reports getting somewhat winded when she exercises but denies any dizziness or lightheadedness. She hasn't had a cigarette since the day before she was admitted to the hospital and said this is the longest she's gone in the last 34 years. She did report missing medications occasionally leading up to the hospitalization, but only about 4 times per month. She denies any missed doses since her discharge date.   Current Medications: atorvastatin 80 mg daily, niacin 1000 mg qhs, Vascepa 2 g BID, and Lovaza 1 g BID  Intolerances: none  Risk Factors: CAD s/p CABG, NSTEMI, and DES, HTN, HLD, heterozygous familial hypercholesterolemia   LDL goal: <73m/dL  Diet: no canned foods, only chicken and salmon, removed white flour and replaced with whole wheat flour, fresh vegetables  Exercise: walking now up to 30 mins every day since discharged; none prior to admission  Family History: Anesthesia problems in her father; Colon cancer in her sister; Heart disease in her father (MI at age 152.  Social History: she has quit smoking since 06/19/19, no alcohol  Labs: 06/21/19: TC 381, TG 185, HDL 46, LDL 298 - atorvastatin 80 mg,  niacin 1000 mg daily, Lovaza 1 g BID 09/24/17: TC 316, TG 389, HDL 40, LDL 198 - atorvastatin 80 mg, niacin 1000 mg daily, Lovaza 1 g BID 03/29/16: TC 158, TG 205, HDL 52, LDL 65 - atorvastatin 80 mg, niacin 1000 mg daily, Lovaza 1 g BID 07/17/09: TC 193, TG 318, HDL 34, LDL 95 - atorvastatin 40 mg, niacin 500 mg daily  Past Medical History:  Diagnosis Date  . Anemia    takes iron supplement  . Arthritis    knees, shoulders  . Bipolar 1 disorder ([place]   . Cataract, immature   . CHF (congestive heart failure) ([place]   . COPD (chronic obstructive pulmonary disease) ([place]   . Coronary artery disease    triple vessel  . Family history of adverse reaction to anesthesia    states father "did not do well" with anesthesia; died last time he had surgery; also had heart disease  . GERD (gastroesophageal reflux disease)    occasional - no current med.  . Glaucoma   . Hammertoe 05/2015   right second toe  . High cholesterol   . History of MI (myocardial infarction)   . Hypertension    states under control with meds., has been on med. x 8 yr.  . Panic attacks   . Peripheral vascular disease ([place]  carotid  . Renal cyst, left   . Schizophrenia (HCC)   . Short of breath on exertion   . Sleep apnea    no CPAP use  . Urinary, incontinence, stress female   . Wears dentures    upper  . Wears partial dentures    lower    Current Outpatient Medications on File Prior to Visit  Medication Sig Dispense Refill  . albuterol (PROVENTIL HFA;VENTOLIN HFA) 108 (90 BASE) MCG/ACT inhaler Inhale 2 puffs into the lungs every 6 (six) hours as needed for wheezing or shortness of breath.     . aspirin 81 MG chewable tablet Chew 81 mg by mouth daily.    . atorvastatin (LIPITOR) 80 MG tablet Take 1 tablet (80 mg total) by mouth daily at 6 PM. Need to make an appt for overdue visit (Patient not taking: Reported on 01/01/2019) 30 tablet 0  . brimonidine (ALPHAGAN) 0.15 % ophthalmic solution Place 1 drop into both  eyes 2 (two) times daily.     . clopidogrel (PLAVIX) 75 MG tablet Take 1 tablet (75 mg total) by mouth daily with breakfast. 90 tablet 3  . escitalopram (LEXAPRO) 10 MG tablet Take 10 mg by mouth daily.    . fluticasone (FLONASE) 50 MCG/ACT nasal spray Place 1 spray into both nostrils daily.    . Fluticasone-Salmeterol (ADVAIR) 250-50 MCG/DOSE AEPB Inhale 1 puff into the lungs 2 (two) times daily.    . Icosapent Ethyl (VASCEPA) 1 g CAPS Take 2 capsules (2 g total) by mouth 2 (two) times daily. 120 capsule 1  . isosorbide mononitrate (IMDUR) 30 MG 24 hr tablet TAKE 1 TABLET (30 MG TOTAL) BY MOUTH DAILY. NEED OFFICE VISIT. (Patient taking differently: Take 30 mg by mouth daily. ) 90 tablet 3  . lamoTRIgine (LAMICTAL) 150 MG tablet Take 150 mg by mouth daily.    . metoprolol tartrate (LOPRESSOR) 25 MG tablet Take 0.5 tablets (12.5 mg total) by mouth 2 (two) times daily. 60 tablet 6  . niacin (NIASPAN) 1000 MG CR tablet TAKE 1 TABLET (1,000 MG TOTAL) BY MOUTH AT BEDTIME. (Patient taking differently: Take 1,000 mg by mouth at bedtime. ) 90 tablet 3  . nitroGLYCERIN (NITROSTAT) 0.4 MG SL tablet Place 1 tablet (0.4 mg total) under the tongue every 5 (five) minutes as needed. (Patient taking differently: Place 0.4 mg under the tongue every 5 (five) minutes as needed for chest pain. ) 25 tablet 3  . omega-3 acid ethyl esters (LOVAZA) 1 g capsule Take 1 capsule (1 g total) by mouth 2 (two) times daily. 60 capsule 11  . QUEtiapine (SEROQUEL) 200 MG tablet Take 100 mg by mouth at bedtime.     No current facility-administered medications on file prior to visit.     Allergies  Allergen Reactions  . Benadryl [Diphenhydramine Hcl] Shortness Of Breath and Swelling    Tongue swelling  . Codeine Other (See Comments)    "makes me psychotic"   . Haldol [Haloperidol Decanoate] Anaphylaxis  . Amoxicillin Hives and Swelling    Swelling to extremities Did it involve swelling of the face/tongue/throat, SOB, or low  BP? Yes Did it involve sudden or severe rash/hives, skin peeling, or any reaction on the inside of your mouth or nose? Yes Did you need to seek medical attention at a hospital or doctor's office? Yes When did it last happen?young adult If all above answers are "NO", may proceed with cephalosporin use.  . Bee Venom Swelling and Other (See Comments)    "  pimples" from head to toe and whole body swelling    Assessment/Plan:  1. Hyperlipidemia - LDL is 298 (goal < 55) and TG is 185 (goal < 150). Start Repatha 140 mg [place] once every 14 days. Prior authorization sent to insurance company and first injection given in clinic given high LDL on current therapy and recent event. Continue taking atorvastatin 80 mg daily, niacin 1000 mg daily, Vascepa 2 g BID and stop Lovaza (duplicate therapy). Encouraged pt to continue the great work she has been doing to implement lifestyle changes to improve health including changing her diet, increasing exercise, and quitting smoking. Needs lipid check in ~2 months. Pt states she may want to get lipids checked with PCP at next visit so will follow up with pt in a month or so to check to see if it still needs scheduled.   Megan McCarthy, PharmD PGY2 Cardiology Pharmacy Resident [facility] [address], [place], [place] [address] Phone: [phone]; Fax: [phone]  [employer] Evolocumab [id number] Site No: [id number] Subject ID No: [id number]     Did subject meet all eligibility criteria?  Code  No Yes  101  Adults age ? 40 years [] [x]  One or both of the following:   102 A  Hospitalization for a clinical ASCVD event: acute (MI), UA, IS, or CLI within 18 months of enrollment        Note: Subjects must have been admitted to the hospital. Those       who are admitted and discharged in less than 24 hours are      Eligible for the study.Subjects who have been admitted to     The ER for a clinical ASCVD. [] [x]  102B Coronary or peripheral  revascularization including percutaneous or surgical revascularization in the past 18 months [] [x]  One of the following:  103 A  LDL ? 70 mg/dL (1.81 mmol/L) with no plans for immediate initiation or titration of statin therapy [] [x]  103 B Newly started on PCSK9i after the index hospitalization/procedure and prior to enrollment (but no more than 6 months prior to enrollment) with pre-PCSK9i treatment LDL-C value available and measured within 6 months of starting PCSK9i and known background LLT any time prior to PCSK9i initiation. [x] []  105 Planned follow-up within the health system [] [x]  Subject does not meet any of the following exclusion criteria   201 Unable or unwilling to provide informed consent, including but not limited to cognitive or language barriers (reading or comprehension) [x] []  202 Lack of phone or email for contact [x] []  203 Evidence of end stage renal disease (ESRD) or stage 5 CKD [x] []  204 Anticipated life expectancy less than 6 month  [x] []  206  On a PCSK9i prior to their qualifying event [x] []     Subject Name: [name]  Subject met inclusion and exclusion criteria.  The informed consent form, study requirements and expectations were reviewed with the subject and questions and concerns were addressed prior to the signing of the consent form.  The subject verbalized understanding of the trial requirements.  The subject agreed to participate in the CVMOBIUS trial and signed the informed consent at 3:00pm on11/09/20.  The informed consent was obtained prior to performance of any protocol-specific procedures for the subject.  A copy of the signed informed consent was given to the subject and a copy was placed in the subject's medical   record.   Megan Supple   Socioeconomic Status - Baseline  Education Some High School or Less High School graduate Some College/University Graduated College/University or above [] [] [] [x]   Marital Status  Married Single Divorced Separated [] [] [x] []   Income <$15,000 $15,001 - $34,999 $35,000 - $74,999 $75,000 - $99,999 >$100,000 [x] [] [] [] []       

## 2019-06-30 NOTE — Telephone Encounter (Signed)
Pt insurance is active and benefits verified through Medicare A/B. Co-pay $0.00, DED $0.00/$0.00 met, out of pocket $0.00/$0.00 met, co-insurance 0%. No pre-authorization required. Passport, 06/30/2019 @ 10:22AM, REF#[id number] ° °Pt insurance is active through Medicaid. Ref#[id number] ° °Will fax over Medicaid Reimbursement form to Dr. Berry ° °Will pass to RN Navigator for review. °

## 2019-06-30 NOTE — Patient Instructions (Addendum)
Thank you for seeing [name] today!  Your LDL is 298 (goal < 55) and triglycerides are 185 (goal < 150)  Continue taking atorvastatin and Vascepa. You can stop taking Lovaza.   We have sent in a prescription refill for your atorvastatin.   Start taking Repatha injections 1 injection every 14 days. You received your first injection in clinic today on 06/30/19. Your next injection will be due on 07/14/19.  We will send in the prior authorization paperwork to your insurance company before sending the prescription over to the pharmacy.   You will need another cholesterol check in about 2 months. I will give you a call in the next month or so to get that scheduled if it hasn't been scheduled already.   Keep up the great work!!   Phone: [phone]; Fax: [phone]

## 2019-07-01 ENCOUNTER — Encounter: Payer: Self-pay | Admitting: *Deleted

## 2019-07-01 DIAGNOSIS — Z1211 Encounter for screening for malignant neoplasm of colon: Secondary | ICD-10-CM | POA: Diagnosis not present

## 2019-07-01 DIAGNOSIS — I2510 Atherosclerotic heart disease of native coronary artery without angina pectoris: Secondary | ICD-10-CM | POA: Diagnosis not present

## 2019-07-01 DIAGNOSIS — M542 Cervicalgia: Secondary | ICD-10-CM | POA: Diagnosis not present

## 2019-07-01 DIAGNOSIS — J449 Chronic obstructive pulmonary disease, unspecified: Secondary | ICD-10-CM | POA: Diagnosis not present

## 2019-07-01 DIAGNOSIS — F3170 Bipolar disorder, currently in remission, most recent episode unspecified: Secondary | ICD-10-CM | POA: Diagnosis not present

## 2019-07-01 DIAGNOSIS — R2681 Unsteadiness on feet: Secondary | ICD-10-CM | POA: Diagnosis not present

## 2019-07-02 ENCOUNTER — Other Ambulatory Visit: Payer: Self-pay

## 2019-07-02 ENCOUNTER — Encounter: Payer: Self-pay | Admitting: Internal Medicine

## 2019-07-02 ENCOUNTER — Ambulatory Visit (INDEPENDENT_AMBULATORY_CARE_PROVIDER_SITE_OTHER): Payer: Medicare Other | Admitting: Internal Medicine

## 2019-07-02 VITALS — BP 106/68 | HR 60 | Temp 98.0°F | Ht 65.0 in | Wt 185.4 lb

## 2019-07-02 DIAGNOSIS — K219 Gastro-esophageal reflux disease without esophagitis: Secondary | ICD-10-CM | POA: Diagnosis not present

## 2019-07-02 DIAGNOSIS — Z1211 Encounter for screening for malignant neoplasm of colon: Secondary | ICD-10-CM

## 2019-07-02 DIAGNOSIS — K5909 Other constipation: Secondary | ICD-10-CM

## 2019-07-02 NOTE — Progress Notes (Signed)
Patient ID: [name], female   DOB: [birth year], 58 y.o.   MRN: [id number] HPI: [name] is a is calling to find out what she is here alone today.  She was hospitalized earlier this month for chest pain and underwent recurrent coronary angiography with stent placement.  She has been placed on Plavix and aspirin therapy.  Prior to this she was having indigestion and upper abdominal pain and bloating which is all improved after her PCI.  She denies heartburn, dysphagia and odynophagia.  No nausea or vomiting.  Abdominal discomfort and bloating also improved.  She does struggle with constipation which is lifelong.  Her bowel movements are irregular approximately twice per week.  Stools have been normal color without blood or melena.  Family history notable for lung cancer in her mother.  Specifically there is no colon cancer.  The chart did mention colon cancer in her sister but reports this is not true.  Past Medical History:  Diagnosis Date  . Anemia    takes iron supplement  . Arthritis    knees, shoulders  . Bipolar 1 disorder ([place])   . Cataract, immature   . CHF (congestive heart failure) ([place])   . COPD (chronic obstructive pulmonary disease) ([place])   . Coronary artery disease    triple vessel  . Family history of adverse reaction to anesthesia    states father "did not do well" with anesthesia; died last time he had surgery; also had heart disease  . Gallstones   . GERD (gastroesophageal reflux disease)    occasional - no current med.  . Glaucoma   . Hammertoe 05/2015   right second toe  . Hepatic steatosis   . High cholesterol   . History of MI (myocardial infarction)   . Hypertension    states under control with meds., has been on med. x 8 yr.  . Panic attacks   . Peripheral vascular disease ([place])    carotid  . Renal cyst, left   . Schizophrenia ([place])   . Short of breath on exertion   . Sleep apnea    no CPAP use  . Urinary, incontinence, stress female   .  Wears dentures    upper  . Wears partial dentures    lower    Past Surgical History:  Procedure Laterality Date  . C-SECTIONS X 2  1985, 1988  . CARDIAC CATHETERIZATION  12/10/2006; 12/18/2006; 07/10/2007; 07/19/2009  . CARDIAC CATHETERIZATION  03/04/2010   patent grafts, normal LV function, diffusely diseased RCA (Dr. Adora Fridge)  . CARDIAC CATHETERIZATION N/A 02/15/2016   Procedure: Left Heart Cath and Cors/Grafts Angiography;  Surgeon: Peter M Martinique, MD;  Location: [facility];  Service: Cardiovascular;  Laterality: N/A;  . CLUB FOOT RELEASE Bilateral    1st and 2nd toes  . CORONARY ARTERY BYPASS GRAFT  07/16/2007   LIMA to LAD, VG to ramus, VG to PDA   . CORONARY STENT INTERVENTION N/A 06/23/2019   Procedure: CORONARY STENT INTERVENTION;  Surgeon: Lorretta Harp, MD;  Location: [facility];  Service: Cardiovascular;  Laterality: N/A;  . FLEXIBLE BRONCHOSCOPY  07/16/2007  . HAMMERTOE RECONSTRUCTION WITH WEIL OSTEOTOMY Right 06/03/2015   Procedure: RIGHT SECOND HAMMERTOE CORRECTION WITH MT WEIL OSTEOTOMY;  Surgeon: Wylene Simmer, MD;  Location: [facility];  Service: Orthopedics;  Laterality: Right;  . HAND SURGERY Bilateral    1st and 2nd fingers - release of clubbing  . KNEE ARTHROSCOPY WITH ANTERIOR CRUCIATE  LIGAMENT (ACL) REPAIR Right 01/30/2008  . LEFT HEART CATH AND CORS/GRAFTS ANGIOGRAPHY N/A 06/23/2019   Procedure: LEFT HEART CATH AND CORS/GRAFTS ANGIOGRAPHY;  Surgeon: Lorretta Harp, MD;  Location: [facility];  Service: Cardiovascular;  Laterality: N/A;  . NOVASURE ABLATION  12/19/2007   with removal of IUD  . ORIF FOOT FRACTURE Left   . TOE SURGERIES TO REMOVE NAILS DUE TO CLUBBING    . TOTAL KNEE ARTHROPLASTY Right 10/21/2012   Procedure: TOTAL KNEE ARTHROPLASTY;  Surgeon: Mauri Pole, MD;  [facility];  Service: Orthopedics;  Laterality: Right;    Outpatient Medications Prior to Visit  Medication Sig Dispense Refill  . albuterol  (PROVENTIL HFA;VENTOLIN HFA) 108 (90 BASE) MCG/ACT inhaler Inhale 2 puffs into the lungs every 6 (six) hours as needed for wheezing or shortness of breath.     Marland Kitchen aspirin 81 MG chewable tablet Chew 81 mg by mouth daily.    Marland Kitchen atorvastatin (LIPITOR) 80 MG tablet Take 1 tablet (80 mg total) by mouth daily. Need to make an appt for overdue visit 30 tablet 11  . Evolocumab (REPATHA [place]) Inject into the skin every 14 (fourteen) days.    . brimonidine (ALPHAGAN) 0.15 % ophthalmic solution Place 1 drop into both eyes 2 (two) times daily.     . clopidogrel (PLAVIX) 75 MG tablet Take 1 tablet (75 mg total) by mouth daily with breakfast. 90 tablet 3  . escitalopram (LEXAPRO) 10 MG tablet Take 10 mg by mouth daily.    . fluticasone (FLONASE) 50 MCG/ACT nasal spray Place 1 spray into both nostrils daily.    Vanessa Kick Ethyl (VASCEPA) 1 g CAPS Take 2 capsules (2 g total) by mouth 2 (two) times daily. 120 capsule 1  . isosorbide mononitrate (IMDUR) 30 MG 24 hr tablet TAKE 1 TABLET (30 MG TOTAL) BY MOUTH DAILY. NEED OFFICE VISIT. (Patient taking differently: Take 30 mg by mouth daily. ) 90 tablet 3  . metoprolol tartrate (LOPRESSOR) 25 MG tablet Take 0.5 tablets (12.5 mg total) by mouth 2 (two) times daily. 60 tablet 6  . niacin (NIASPAN) 1000 MG CR tablet TAKE 1 TABLET (1,000 MG TOTAL) BY MOUTH AT BEDTIME. (Patient taking differently: Take 1,000 mg by mouth at bedtime. ) 90 tablet 3  . nitroGLYCERIN (NITROSTAT) 0.4 MG SL tablet Place 1 tablet (0.4 mg total) under the tongue every 5 (five) minutes as needed. (Patient taking differently: Place 0.4 mg under the tongue every 5 (five) minutes as needed for chest pain. ) 25 tablet 3  . QUEtiapine (SEROQUEL) 200 MG tablet Take 100 mg by mouth at bedtime.    . Fluticasone-Salmeterol (ADVAIR) 250-50 MCG/DOSE AEPB Inhale 1 puff into the lungs 2 (two) times daily.    Marland Kitchen lamoTRIgine (LAMICTAL) 150 MG tablet Take 150 mg by mouth daily.     No facility-administered medications  prior to visit.     Allergies  Allergen Reactions  . Benadryl [Diphenhydramine Hcl] Shortness Of Breath and Swelling    Tongue swelling  . Codeine Other (See Comments)    "makes me psychotic"   . Haldol [Haloperidol Decanoate] Anaphylaxis  . Amoxicillin Hives and Swelling    Swelling to extremities Did it involve swelling of the face/tongue/throat, SOB, or low BP? Yes Did it involve sudden or severe rash/hives, skin peeling, or any reaction on the inside of your mouth or nose? Yes Did you need to seek medical attention at a hospital or doctor's office? Yes When did it  last happen?young adult If all above answers are "NO", may proceed with cephalosporin use.  . Bee Venom Swelling and Other (See Comments)    "pimples" from head to toe and whole body swelling    Family History  Problem Relation Age of Onset  . Heart disease Father   . Anesthesia problems Father        states "did not do well" with anesthesia  . Lung cancer Mother     Social History   Tobacco Use  . Smoking status: Former Smoker    Packs/day: 0.00    Years: 0.00    Pack years: 0.00    Quit date: 05/21/2015    Years since quitting: 4.1  . Smokeless tobacco: Never Used  . Tobacco comment: pt states she quit a week ago 06/14/19  Substance Use Topics  . Alcohol use: No  . Drug use: No    ROS: As per history of present illness, otherwise negative  BP 106/68   Pulse 60   Temp 98 F (36.7 C)   Ht 5\' 5"  (1.651 m)   Wt 185 lb 6.4 oz (84.1 kg)   LMP 11/16/2011   BMI 30.85 kg/m  Gen: awake, alert, NAD HEENT: anicteric CV: RRR, no mrg Pulm: CTA b/l Abd: soft, NT/ND, +BS throughout Ext: no c/c/e Neuro: nonfocal   RELEVANT LABS AND IMAGING: CBC    Component Value Date/Time   WBC 7.5 06/25/2019 0259   RBC 3.95 06/25/2019 0259   HGB 13.0 06/25/2019 0259   HCT 38.2 06/25/2019 0259   PLT 227 06/25/2019 0259   MCV 96.7 06/25/2019 0259   MCH 32.9 06/25/2019 0259   MCHC 34.0 06/25/2019 0259    RDW 14.0 06/25/2019 0259   LYMPHSABS 3.2 06/24/2014 1755   MONOABS 0.9 06/24/2014 1755   EOSABS 0.5 06/24/2014 1755   BASOSABS 0.1 06/24/2014 1755    CMP     Component Value Date/Time   NA 139 06/24/2019 0446   K 3.7 06/24/2019 0446   CL 105 06/24/2019 0446   CO2 22 06/24/2019 0446   GLUCOSE 100 (H) 06/24/2019 0446   BUN 10 06/24/2019 0446   CREATININE 1.10 (H) 06/24/2019 0446   CREATININE 1.10 (H) 02/11/2016 0921   CALCIUM 9.3 06/24/2019 0446   PROT 7.8 10/29/2017 1931   ALBUMIN 4.2 10/29/2017 1931   AST 33 10/29/2017 1931   ALT 42 10/29/2017 1931   ALKPHOS 104 10/29/2017 1931   BILITOT 0.5 10/29/2017 1931   GFRNONAA 56 (L) 06/24/2019 0446   GFRAA >60 06/24/2019 0446    ASSESSMENT/PLAN: 58 year old female with a past medical history of GERD, chronic constipation, coronary artery disease status post CABG and recent myocardial infarction requiring PCI x3 with drug-eluting stent placed on 06/23/2019, hypertension, hyperlipidemia who is seen to discuss colon cancer screening and chronic constipation.  1.  Colon cancer screening --colonoscopy would normally be recommended at this time but given her recent PCI with drug-eluting stent she will need to remain on Plavix plus aspirin for 12 months without interrupting therapy.  We discussed this today.  Diagnostic only colonoscopy without polypectomy as an option, waiting 1 year is an option and Cologuard is an option.  After thorough discussion she prefers to proceed with Cologuard.  She understands that if this is positive colonoscopy will be recommended. --Proceed with Cologuard for screening  2.  Chronic constipation --begin MiraLAX 17 g daily.  If ineffective we could consider Linzess or Amitiza --MiraLAX 17 g daily  3. GERD --history  of but not currently a problem.  She is trying to eat and follow a more healthy diet post coronary intervention.  Some of her indigestion symptoms were likely angina now relieved by PCI.  We can  address reflux if becomes symptomatic in the future   [id number], [name], Md [address],  [place] [address]

## 2019-07-02 NOTE — Patient Instructions (Addendum)
If you are age 57 or older, your body mass index should be between 23-30. Your Body mass index is 30.85 kg/m. If this is out of the aforementioned range listed, please consider follow up with your Primary Care Provider.  If you are age 28 or younger, your body mass index should be between 19-25. Your Body mass index is 30.85 kg/m. If this is out of the aformentioned range listed, please consider follow up with your Primary Care Provider.   Your provider has ordered Cologuard testing as an option for colon cancer screening. This is performed by [employer] and may be out of network with your insurance. PRIOR to completing the test, it is YOUR responsibility to contact your insurance about covered benefits for this test. Your out of pocket expense could be anywhere from $0.00 to $649.00.   When you call to check coverage with your insurer, please provide the following information:   -The ONLY provider of Cologuard is [place] code for Cologuard is [id number].  [employer] NPI # [id number]  -[employer] Tax ID # [id number]   We have already sent your demographic and insurance information to [employer] (phone number [phone]) and they should contact you within the next week regarding your test. If you have not heard from them within the next week, please call our office at [phone].  Miralax 1 capful a day. Call if no improvement! [phone]   It was a pleasure to see you today!  Dr. Hilarie Fredrickson

## 2019-07-03 ENCOUNTER — Telehealth: Payer: Self-pay | Admitting: Pharmacist

## 2019-07-03 ENCOUNTER — Encounter: Payer: Self-pay | Admitting: Cardiology

## 2019-07-03 ENCOUNTER — Ambulatory Visit (INDEPENDENT_AMBULATORY_CARE_PROVIDER_SITE_OTHER): Payer: Medicare Other | Admitting: Cardiology

## 2019-07-03 DIAGNOSIS — I10 Essential (primary) hypertension: Secondary | ICD-10-CM

## 2019-07-03 DIAGNOSIS — Z9861 Coronary angioplasty status: Secondary | ICD-10-CM

## 2019-07-03 DIAGNOSIS — Z951 Presence of aortocoronary bypass graft: Secondary | ICD-10-CM

## 2019-07-03 DIAGNOSIS — I2510 Atherosclerotic heart disease of native coronary artery without angina pectoris: Secondary | ICD-10-CM | POA: Diagnosis not present

## 2019-07-03 DIAGNOSIS — E785 Hyperlipidemia, unspecified: Secondary | ICD-10-CM | POA: Diagnosis not present

## 2019-07-03 MED ORDER — NITROGLYCERIN 0.4 MG SL SUBL: 0.4000 mg | SUBLINGUAL_TABLET | SUBLINGUAL | 3 refills | Status: DC | PRN

## 2019-07-03 NOTE — Telephone Encounter (Signed)
Prior authorization for Repatha denied through [place] - they stated pt has Part D insurance as well through [facility]. Pt provided the following information:  ID: [id number] GRP: [id number] BIN: [id number] PCN: [id number]  Will apply for Repatha through her Part D insurance.

## 2019-07-03 NOTE — Progress Notes (Signed)
Cardiology Office Note:    Date:  07/03/2019   ID:  [name], DOB [birth year], MRN [id number]  PCP:  Katherina Mires, MD  Cardiologist:  Dr Gwenlyn Found Electrophysiologist:  None   Referring MD: Katherina Mires, MD   No chief complaint on file. [facility] follow up  History of Present Illness:    [name] is a 58 y.o. female with a hx of CAD.  She had a PCI in the past by Dr. Doylene Canard, details are unavailable. She then had CABG in 2008 x3.  Cath in 2011 and 2017 showed patent grafts. Other medical problems include familial hyperlipidemia and prior morbid obesity (she lost 100 lbs some years ago and has managed to keep the weight off).   She was admitted 06/22/2019 with Lt arm and jaw pain.  Cath showed occlusion of the distal SVG to PDA and she underwent PCI with DES.  The LIMA to LAD was patent, the SVG to RI had a 50% narrowing, the mSVG-PDA had a 50% narrowing.  LVF was normal.  She is in the office today for follow-up.  Since discharge she has had some fatigue but otherwise feels well.  She has a history of bradycardia she had actually had syncope previously this year.  Her metoprolol was cut back.  Her heart rate today is 54.  Otherwise she is doing well since she left the hospital.  She is anxious to start cardiac rehab.  She is not smoking since her hospitalization.  She is also been put on Repatha, her LDL was 290 on Lipitor 80 mg.   Past Medical History:  Diagnosis Date  . Anemia    takes iron supplement  . Arthritis    knees, shoulders  . Bipolar 1 disorder ([place])   . Cataract, immature   . CHF (congestive heart failure) ([place])   . COPD (chronic obstructive pulmonary disease) ([place])   . Coronary artery disease    triple vessel  . Family history of adverse reaction to anesthesia    states father "did not do well" with anesthesia; died last time he had surgery; also had heart disease  . Gallstones   . GERD (gastroesophageal reflux disease)    occasional - no  current med.  . Glaucoma   . Hammertoe 05/2015   right second toe  . Hepatic steatosis   . High cholesterol   . History of MI (myocardial infarction)   . Hypertension    states under control with meds., has been on med. x 8 yr.  . Panic attacks   . Peripheral vascular disease ([place])    carotid  . Renal cyst, left   . Schizophrenia ([place])   . Short of breath on exertion   . Sleep apnea    no CPAP use  . Urinary, incontinence, stress female   . Wears dentures    upper  . Wears partial dentures    lower    Past Surgical History:  Procedure Laterality Date  . C-SECTIONS X 2  1985, 1988  . CARDIAC CATHETERIZATION  12/10/2006; 12/18/2006; 07/10/2007; 07/19/2009  . CARDIAC CATHETERIZATION  03/04/2010   patent grafts, normal LV function, diffusely diseased RCA (Dr. Adora Fridge)  . CARDIAC CATHETERIZATION N/A 02/15/2016   Procedure: Left Heart Cath and Cors/Grafts Angiography;  Surgeon: Peter M Martinique, MD;  Location: [facility];  Service: Cardiovascular;  Laterality: N/A;  . CLUB FOOT RELEASE Bilateral    1st and 2nd toes  . CORONARY  ARTERY BYPASS GRAFT  07/16/2007   LIMA to LAD, VG to ramus, VG to PDA   . CORONARY STENT INTERVENTION N/A 06/23/2019   Procedure: CORONARY STENT INTERVENTION;  Surgeon: Lorretta Harp, MD;  Location: [facility];  Service: Cardiovascular;  Laterality: N/A;  . FLEXIBLE BRONCHOSCOPY  07/16/2007  . HAMMERTOE RECONSTRUCTION WITH WEIL OSTEOTOMY Right 06/03/2015   Procedure: RIGHT SECOND HAMMERTOE CORRECTION WITH MT WEIL OSTEOTOMY;  Surgeon: Wylene Simmer, MD;  Location: [facility];  Service: Orthopedics;  Laterality: Right;  . HAND SURGERY Bilateral    1st and 2nd fingers - release of clubbing  . KNEE ARTHROSCOPY WITH ANTERIOR CRUCIATE LIGAMENT (ACL) REPAIR Right 01/30/2008  . LEFT HEART CATH AND CORS/GRAFTS ANGIOGRAPHY N/A 06/23/2019   Procedure: LEFT HEART CATH AND CORS/GRAFTS ANGIOGRAPHY;  Surgeon: Lorretta Harp, MD;  Location: [facility];  Service: Cardiovascular;  Laterality: N/A;  . NOVASURE ABLATION  12/19/2007   with removal of IUD  . ORIF FOOT FRACTURE Left   . TOE SURGERIES TO REMOVE NAILS DUE TO CLUBBING    . TOTAL KNEE ARTHROPLASTY Right 10/21/2012   Procedure: TOTAL KNEE ARTHROPLASTY;  Surgeon: Mauri Pole, MD;  [facility];  Service: Orthopedics;  Laterality: Right;    Current Medications: Current Meds  Medication Sig  . albuterol (PROVENTIL HFA;VENTOLIN HFA) 108 (90 BASE) MCG/ACT inhaler Inhale 2 puffs into the lungs every 6 (six) hours as needed for wheezing or shortness of breath.   Marland Kitchen aspirin 81 MG chewable tablet Chew 81 mg by mouth daily.  Marland Kitchen atorvastatin (LIPITOR) 80 MG tablet Take 1 tablet (80 mg total) by mouth daily. Need to make an appt for overdue visit  . brimonidine (ALPHAGAN) 0.15 % ophthalmic solution Place 1 drop into both eyes 2 (two) times daily.   . clopidogrel (PLAVIX) 75 MG tablet Take 1 tablet (75 mg total) by mouth daily with breakfast.  . escitalopram (LEXAPRO) 10 MG tablet Take 10 mg by mouth daily.  . Evolocumab (REPATHA [place]) Inject into the skin every 14 (fourteen) days.  . fluticasone (FLONASE) 50 MCG/ACT nasal spray Place 1 spray into both nostrils daily.  Vanessa Kick Ethyl (VASCEPA) 1 g CAPS Take 2 capsules (2 g total) by mouth 2 (two) times daily.  . isosorbide mononitrate (IMDUR) 30 MG 24 hr tablet Take 30 mg by mouth daily.  . metoprolol tartrate (LOPRESSOR) 25 MG tablet Take 0.5 tablets (12.5 mg total) by mouth 2 (two) times daily.  . niacin (NIASPAN) 1000 MG CR tablet TAKE 1 TABLET (1,000 MG TOTAL) BY MOUTH AT BEDTIME. (Patient taking differently: Take 1,000 mg by mouth at bedtime. )  . nitroGLYCERIN (NITROSTAT) 0.4 MG SL tablet Place 1 tablet (0.4 mg total) under the tongue every 5 (five) minutes as needed for chest pain.  Marland Kitchen QUEtiapine (SEROQUEL) 200 MG tablet Take 100 mg by mouth at bedtime.  . [DISCONTINUED] nitroGLYCERIN (NITROSTAT) 0.4 MG SL tablet Place 1  tablet (0.4 mg total) under the tongue every 5 (five) minutes as needed. (Patient taking differently: Place 0.4 mg under the tongue every 5 (five) minutes as needed for chest pain. )     Allergies:   Benadryl [diphenhydramine hcl], Codeine, Haldol [haloperidol decanoate], Amoxicillin, and Bee venom   Social History   Socioeconomic History  . Marital status: Divorced    Spouse name: Not on file  . Number of children: Not on file  . Years of education: Not on file  . Highest education  level: Not on file  Occupational History  . Not on file  Social Needs  . Financial resource strain: Not on file  . Food insecurity    Worry: Not on file    Inability: Not on file  . Transportation needs    Medical: Not on file    Non-medical: Not on file  Tobacco Use  . Smoking status: Former Smoker    Packs/day: 0.00    Years: 0.00    Pack years: 0.00    Quit date: 05/21/2015    Years since quitting: 4.1  . Smokeless tobacco: Never Used  . Tobacco comment: pt states she quit a week ago 06/14/19  Substance and Sexual Activity  . Alcohol use: No  . Drug use: No  . Sexual activity: Not on file  Lifestyle  . Physical activity    Days per week: Not on file    Minutes per session: Not on file  . Stress: Not on file  Relationships  . Social Herbalist on phone: Not on file    Gets together: Not on file    Attends religious service: Not on file    Active member of club or organization: Not on file    Attends meetings of clubs or organizations: Not on file    Relationship status: Not on file  Other Topics Concern  . Not on file  Social History Narrative  . Not on file     Family History: The patient's family history includes Anesthesia problems in her father; Heart disease in her father; Lung cancer in her mother.  ROS:   Please see the history of present illness.     All other systems reviewed and are negative.  EKGs/Labs/Other Studies Reviewed:    The following studies  were reviewed today: Cath/ PCI 06/23/2019  Recent Labs: 06/21/2019: TSH 1.695 06/24/2019: BUN 10; Creatinine, Ser 1.10; Potassium 3.7; Sodium 139 06/25/2019: Hemoglobin 13.0; Platelets 227  Recent Lipid Panel    Component Value Date/Time   CHOL 381 (H) 06/21/2019 0453   TRIG 185 (H) 06/21/2019 0453   HDL 46 06/21/2019 0453   CHOLHDL 8.3 06/21/2019 0453   VLDL 37 06/21/2019 0453   LDLCALC 298 (H) 06/21/2019 0453    Physical Exam:    VS:  BP 102/64   Pulse (!) 54   Ht 5\' 5"  (1.651 m)   Wt 188 lb (85.3 kg)   LMP 11/16/2011   SpO2 98%   BMI 31.28 kg/m     Wt Readings from Last 3 Encounters:  07/03/19 188 lb (85.3 kg)  07/02/19 185 lb 6.4 oz (84.1 kg)  06/30/19 184 lb (83.5 kg)     GEN:  Well nourished, well developed in no acute distress HEENT: Normal NECK: No JVD; No carotid bruits LYMPHATICS: No lymphadenopathy CARDIAC: RRR, no murmurs, rubs, gallops RESPIRATORY:  Clear to auscultation without rales, wheezing or rhonchi  ABDOMEN: Soft, non-tender, non-distended MUSCULOSKELETAL:  No edema; No deformity , no ecchymosis or hematoma RFA site SKIN: Warm and dry NEUROLOGIC:  Alert and oriented x 3 PSYCHIATRIC:  Normal affect   ASSESSMENT:    CAD- S/P PCI SVG-PDA PCI with DES 06/23/2019  Hx of CABG CABG x 24 Jun 2007  Hyperlipidemia LDL goal <70 Repatha started  PLAN:    I congratulated her on not smoking. OK to begin cardiac rehab.  F/U with dr Gwenlyn Found in 6 weeks.  She will monitor her HR at home, if her HR drops into the  40's consider decreasing Lopressor to 12.5 mg daily.    Medication Adjustments/Labs and Tests Ordered: Current medicines are reviewed at length with the patient today.  Concerns regarding medicines are outlined above.  No orders of the defined types were placed in this encounter.  Meds ordered this encounter  Medications  . nitroGLYCERIN (NITROSTAT) 0.4 MG SL tablet    Sig: Place 1 tablet (0.4 mg total) under the tongue every 5 (five) minutes  as needed for chest pain.    Dispense:  25 tablet    Refill:  3    Patient Instructions  Medication Instructions:  Your physician recommends that you continue on your current medications as directed. Please refer to the Current Medication list given to you today. *If you need a refill on your cardiac medications before your next appointment, please call your pharmacy*  Lab Work: NONE  If you have labs (blood work) drawn today and your tests are completely normal, you will receive your results only by: Marland Kitchen MyChart Message (if you have MyChart) OR . A paper copy in the mail If you have any lab test that is abnormal or we need to change your treatment, we will call you to review the results.  Testing/Procedures: NONE   Follow-Up: At [facility], you and your health needs are our priority.  As part of our continuing mission to provide you with exceptional heart care, we have created designated Provider Care Teams.  These Care Teams include your primary Cardiologist (physician) and Advanced Practice Providers (APPs -  Physician Assistants and Nurse Practitioners) who all work together to provide you with the care you need, when you need it.  Your next appointment:   6 WEEKS   The format for your next appointment:   In Person  Provider:   Quay Burow, MD  Other Instructions OK TO START CARDIAC REHAB     Signed, Kerin Ransom, [place]  07/03/2019 9:48 AM    [place]

## 2019-07-03 NOTE — Assessment & Plan Note (Signed)
Repatha started

## 2019-07-03 NOTE — Assessment & Plan Note (Signed)
SVG-PDA PCI with DES 06/23/2019

## 2019-07-03 NOTE — Assessment & Plan Note (Signed)
CABG x 24 Jun 2007

## 2019-07-03 NOTE — Patient Instructions (Addendum)
Medication Instructions:  Your physician recommends that you continue on your current medications as directed. Please refer to the Current Medication list given to you today. *If you need a refill on your cardiac medications before your next appointment, please call your pharmacy*  Lab Work: NONE  If you have labs (blood work) drawn today and your tests are completely normal, you will receive your results only by: Marland Kitchen MyChart Message (if you have MyChart) OR . A paper copy in the mail If you have any lab test that is abnormal or we need to change your treatment, we will call you to review the results.  Testing/Procedures: NONE   Follow-Up: At [facility], you and your health needs are our priority.  As part of our continuing mission to provide you with exceptional heart care, we have created designated Provider Care Teams.  These Care Teams include your primary Cardiologist (physician) and Advanced Practice Providers (APPs -  Physician Assistants and Nurse Practitioners) who all work together to provide you with the care you need, when you need it.  Your next appointment:   6 WEEKS   The format for your next appointment:   In Person  Provider:   Quay Burow, MD  Other Instructions OK TO [place]

## 2019-07-04 NOTE — Telephone Encounter (Signed)
Received call from [facility] stating that Praluent is preferred on formulary instead of [place]. Advised this is fine, they will fax updated paperwork. Pt will need higher 150mg  dose due to baseline LDL of 298.

## 2019-07-07 ENCOUNTER — Encounter: Payer: Self-pay | Admitting: Pharmacist

## 2019-07-07 MED ORDER — PRALUENT 150 MG/ML ~~LOC~~ SOAJ: 1.0000 "pen " | SUBCUTANEOUS | 3 refills | Status: DC

## 2019-07-07 NOTE — Telephone Encounter (Signed)
Praluent preferred on formulary and has been approved until further notice. Rx sent to pharmacy and pt aware.

## 2019-07-07 NOTE — Telephone Encounter (Signed)
Prior auth for Repatha denied, Praluent preferred and on formulary. This has been approved until further notice. Rx sent to pharmacy and pt aware. This encounter was created in error - please disregard.

## 2019-07-11 ENCOUNTER — Telehealth (HOSPITAL_COMMUNITY): Payer: Self-pay

## 2019-07-11 NOTE — Telephone Encounter (Signed)
Called patient to see if she was interested in participating in the Cardiac Rehab Program. Patient stated yes. Patient will come in for orientation on 08/12/2019 @ 2PM and will attend the 3PM exercise class.  Tourist information centre manager.

## 2019-07-21 ENCOUNTER — Telehealth: Payer: Self-pay

## 2019-07-21 NOTE — Telephone Encounter (Signed)
Incoming fax from [employer]. The Cologuard sample could not be processed, the sample has exceeded stability limit. [employer] will reach out to patient to have them repeat sample.

## 2019-08-08 ENCOUNTER — Telehealth (HOSPITAL_COMMUNITY): Payer: Self-pay

## 2019-08-08 ENCOUNTER — Encounter (HOSPITAL_COMMUNITY): Payer: Self-pay

## 2019-08-08 NOTE — Telephone Encounter (Signed)
Cardiac Rehab Medication Review by a Pharmacist  Does the patient feel that his/her medications are working for him/her?  yes  Has the patient been experiencing any side effects to the medications prescribed?  no  Does the patient measure his/her own blood pressure or blood glucose at home?  yes  BP 119/65. No blood glucose checks at home.   Does the patient have any problems obtaining medications due to transportation or finances?   no  Understanding of regimen: good Understanding of indications: good Potential of compliance: good   Pharmacist comments: The patient has no issues with her medications at this time. She has not had to use her rescue inhaler in the last 30 days and typically does not need to in the winter. She has had no chest pain requiring use of nitroglycerin tablets. She had Repatha on her medication list, but she is not taking it because her insurance would not cover it. The prescriber started her on Praluent instead. She had Seroquel 100 mg daily at bedtime, but she says that she is now taking 150 mg daily. 200 mg was too much and 100 mg was too little according to the patient.   Agnes Lawrence, PharmD PGY1 Pharmacy Resident

## 2019-08-11 ENCOUNTER — Telehealth (HOSPITAL_COMMUNITY): Payer: Self-pay

## 2019-08-11 NOTE — Telephone Encounter (Signed)
Successful telephone encounter to [name] to confirm Cardiac Rehab orientation appointment for 08/12/2019 at 1430. Nursing assessment completed. Instructions for appointment provided. Patient screening for Covid-19 negative.  [name] E. Laray Anger, BSN

## 2019-08-12 ENCOUNTER — Telehealth (HOSPITAL_COMMUNITY): Payer: Self-pay

## 2019-08-12 ENCOUNTER — Inpatient Hospital Stay (HOSPITAL_COMMUNITY): Admission: RE | Admit: 2019-08-12 | Payer: Medicare Other | Source: Ambulatory Visit

## 2019-08-12 NOTE — Telephone Encounter (Signed)
Unsuccessful telephone call to [name] to discuss missed cardiac rehab orientation appointment for today 08/12/2019 at 2:00 pm. Patient was a no call/no show. Appointment was confirmed with patient yesterday and telephone health history completed. Hipaa compliant VM message left requesting call back at [phone]. Danette Weinfeld E. Laray Anger, BSN

## 2019-08-18 ENCOUNTER — Ambulatory Visit (HOSPITAL_COMMUNITY): Payer: Medicare Other

## 2019-08-18 ENCOUNTER — Encounter (HOSPITAL_COMMUNITY): Payer: Self-pay

## 2019-08-20 ENCOUNTER — Ambulatory Visit (HOSPITAL_COMMUNITY): Payer: Medicare Other

## 2019-08-25 ENCOUNTER — Ambulatory Visit (HOSPITAL_COMMUNITY): Payer: Medicare Other

## 2019-08-26 ENCOUNTER — Ambulatory Visit (INDEPENDENT_AMBULATORY_CARE_PROVIDER_SITE_OTHER): Payer: Medicare Other | Admitting: Cardiovascular Disease

## 2019-08-26 ENCOUNTER — Encounter: Payer: Self-pay | Admitting: Cardiovascular Disease

## 2019-08-26 ENCOUNTER — Other Ambulatory Visit: Payer: Self-pay

## 2019-08-26 VITALS — BP 138/87 | HR 57 | Temp 97.3°F | Ht 65.0 in | Wt 198.0 lb

## 2019-08-26 DIAGNOSIS — I10 Essential (primary) hypertension: Secondary | ICD-10-CM

## 2019-08-26 DIAGNOSIS — E785 Hyperlipidemia, unspecified: Secondary | ICD-10-CM | POA: Diagnosis not present

## 2019-08-26 DIAGNOSIS — I25810 Atherosclerosis of coronary artery bypass graft(s) without angina pectoris: Secondary | ICD-10-CM | POA: Diagnosis not present

## 2019-08-26 NOTE — Assessment & Plan Note (Signed)
History of CAD status post RCA stenting by Dr. Doylene Canard in the past she had coronary artery bypass grafting November 2008 with a LIMA to her LAD, vein to the ramus branch and PDA.  She had catheterization performed 03/04/2010 revealing patent grafts and normal LV function.  She had repeat cardiac catheterization performed by Dr. Martinique 6/17 revealing unchanged anatomy compared to her prior cath.  She was admitted with unstable angina 06/20/2019 and underwent cardiac catheterization by myself revealing a high-grade distal PDA SVG stenosis which I stented with a 3.5 x 20 mm long Synergy drug-eluting stent followed by a 3.5 x 26 resolute Onyx drug-eluting stent.  She did have no reflow which ultimately improved.  She was placed on Angiomax and Aggrastat.  Since discharge she is had no recurrent symptoms.

## 2019-08-26 NOTE — Assessment & Plan Note (Signed)
History of essential hypertension with blood pressure measured today at 138/87.  She is on metoprolol.

## 2019-08-26 NOTE — Progress Notes (Signed)
08/26/2019 [name]   April 18, 1961  [id number]  Primary Physician [name], Angela Rodney, MD Primary Cardiologist: Angela Harp MD FACP, [name], [place], [place]  HPI:  [name] is a 59 y.o.  severely overweight divorced Caucasian female mother of 2, grandmother and 3 grandchildren who I last saw virtually by telephone 01/01/2019. Her mother, [name] , as also patient of mine.She is currently in school getting her social workers degree an [place].She has a history of RCA stenting by Dr. Dr. Doylene Canard in the past. She had coronary artery bypass grafting November 2008 with LIMA to LAD, vein to ramus branch and PDA. She recently stopped smoking 4 months ago. Her palms include hypertension, hyperlipidemia and obstructive sleep apnea intolerant to see. I catheterized her 03/04/10 revealing patent grafts and normal LV function. Her last Myoview performed 04/18/11 with nonischemic. She was experiencing a lot of stress in her life and developed chest pain. She saw [name], nurse practitioner on 02/11/16. At that time, she was up for cardiac catheterization which was performed 4 days later by Dr. Peter Walsh. He demonstrated widely patent grafts, normal LV function and unchanged anatomy compared to her cath performed in 2011.  Since I saw her 7 months ago she was admitted to the hospital with unstable angina 06/20/2019 and discharged 5 days later.  I performed cardiac catheterization on her 06/23/2019 revealing a high-grade distal PDA SVG stenosis which I stented.  She is complicated by no reflow requiring additional stenting and Aggrastat.  Since discharge she has had no recurrent chest pain.  She has stopped smoking.  Current Meds  Medication Sig  . albuterol (PROVENTIL HFA;VENTOLIN HFA) 108 (90 BASE) MCG/ACT inhaler Inhale 2 puffs into the lungs every 6 (six) hours as needed for wheezing or shortness of breath.   . Alirocumab (PRALUENT) 150 MG/ML SOAJ Inject 1 pen into the skin every 14  (fourteen) days.  Marland Kitchen aspirin 81 MG chewable tablet Chew 81 mg by mouth daily.  Marland Kitchen atorvastatin (LIPITOR) 80 MG tablet Take 1 tablet (80 mg total) by mouth daily. Need to make an appt for overdue visit  . BREO ELLIPTA 100-25 MCG/INH AEPB Inhale 1 puff into the lungs daily.   . brimonidine (ALPHAGAN) 0.15 % ophthalmic solution Place 1 drop into both eyes 2 (two) times daily.   . clopidogrel (PLAVIX) 75 MG tablet Take 1 tablet (75 mg total) by mouth daily with breakfast.  . escitalopram (LEXAPRO) 10 MG tablet Take 10 mg by mouth daily.  . fluticasone (FLONASE) 50 MCG/ACT nasal spray Place 1 spray into both nostrils daily.  [name] [name] (VASCEPA) 1 g CAPS Take 2 capsules (2 g total) by mouth 2 (two) times daily.  . isosorbide mononitrate (IMDUR) 30 MG 24 hr tablet Take 30 mg by mouth daily.  Marland Kitchen lamoTRIgine (LAMICTAL) 150 MG tablet Take 150 mg by mouth 2 (two) times daily.  . metoprolol tartrate (LOPRESSOR) 25 MG tablet Take 0.5 tablets (12.5 mg total) by mouth 2 (two) times daily.  . niacin (NIASPAN) 1000 MG CR tablet TAKE 1 TABLET (1,000 MG TOTAL) BY MOUTH AT BEDTIME. (Patient taking differently: Take 1,000 mg by mouth at bedtime. )  . nitroGLYCERIN (NITROSTAT) 0.4 MG SL tablet Place 1 tablet (0.4 mg total) under the tongue every 5 (five) minutes as needed for chest pain.  Marland Kitchen QUEtiapine (SEROQUEL) 200 MG tablet Take 150 mg by mouth at bedtime.      Allergies  Allergen Reactions  .  Benadryl [Diphenhydramine Hcl] Shortness Of Breath and Swelling    Tongue swelling  . Codeine Other (See Comments)    "makes me psychotic"   . Haldol [Haloperidol Decanoate] Anaphylaxis  . Amoxicillin Hives and Swelling    Swelling to extremities Did it involve swelling of the face/tongue/throat, SOB, or low BP? Yes Did it involve sudden or severe rash/hives, skin peeling, or any reaction on the inside of your mouth or nose? Yes Did you need to seek medical attention at a hospital or doctor's office? Yes When  did it last happen?young adult If all above answers are "NO", may proceed with cephalosporin use.  . Bee Venom Swelling and Other (See Comments)    "pimples" from head to toe and whole body swelling    Social History   Socioeconomic History  . Marital status: Divorced    Spouse name: Not on file  . Number of children: Not on file  . Years of education: Not on file  . Highest education level: Not on file  Occupational History  . Not on file  Tobacco Use  . Smoking status: Former Smoker    Packs/day: 0.00    Years: 0.00    Pack years: 0.00    Quit date: 05/21/2015    Years since quitting: 4.2  . Smokeless tobacco: Never Used  . Tobacco comment: pt states she quit a week ago 06/14/19  Substance and Sexual Activity  . Alcohol use: No  . Drug use: No  . Sexual activity: Not on file  Other Topics Concern  . Not on file  Social History Narrative  . Not on file   Social Determinants of Health   Financial Resource Strain:   . Difficulty of Paying Living Expenses: Not on file  Food Insecurity:   . Worried About Charity fundraiser in the Last Year: Not on file  . Ran Out of Food in the Last Year: Not on file  Transportation Needs:   . Lack of Transportation (Medical): Not on file  . Lack of Transportation (Non-Medical): Not on file  Physical Activity:   . Days of Exercise per Week: Not on file  . Minutes of Exercise per Session: Not on file  Stress:   . Feeling of Stress : Not on file  Social Connections:   . Frequency of Communication with Friends and Family: Not on file  . Frequency of Social Gatherings with Friends and Family: Not on file  . Attends Religious Services: Not on file  . Active Member of Clubs or Organizations: Not on file  . Attends Archivist Meetings: Not on file  . Marital Status: Not on file  Intimate Partner Violence:   . Fear of Current or Ex-Partner: Not on file  . Emotionally Abused: Not on file  . Physically Abused: Not on file   . Sexually Abused: Not on file     Review of Systems: General: negative for chills, fever, night sweats or weight changes.  Cardiovascular: negative for chest pain, dyspnea on exertion, edema, orthopnea, palpitations, paroxysmal nocturnal dyspnea or shortness of breath Dermatological: negative for rash Respiratory: negative for cough or wheezing Urologic: negative for hematuria Abdominal: negative for nausea, vomiting, diarrhea, bright red blood per rectum, melena, or hematemesis Neurologic: negative for visual changes, syncope, or dizziness All other systems reviewed and are otherwise negative except as noted above.    Blood pressure 138/87, pulse (!) 57, temperature (!) 97.3 F (36.3 C), height 5\' 5"  (1.651 m), weight 198  lb (89.8 kg), last menstrual period 11/16/2011, SpO2 97 %.  General appearance: alert and no distress Neck: no adenopathy, no carotid bruit, no JVD, supple, symmetrical, trachea midline and thyroid not enlarged, symmetric, no tenderness/mass/nodules Lungs: clear to auscultation bilaterally Heart: regular rate and rhythm, S1, S2 normal, no murmur, click, rub or gallop Extremities: extremities normal, atraumatic, no cyanosis or edema Pulses: 2+ and symmetric Skin: Skin color, texture, turgor normal. No rashes or lesions Neurologic: Alert and oriented X 3, normal strength and tone. Normal symmetric reflexes. Normal coordination and gait  EKG not performed today  ASSESSMENT AND PLAN:   Hyperlipidemia LDL goal <70 History of hyperlipidemia on high-dose atorvastatin and Praluent.  We will recheck a lipid liver profile.  Coronary artery disease involving coronary bypass graft History of CAD status post RCA stenting by Dr. Doylene Canard in the past she had coronary artery bypass grafting November 2008 with a LIMA to her LAD, vein to the ramus branch and PDA.  She had catheterization performed 03/04/2010 revealing patent grafts and normal LV function.  She had repeat cardiac  catheterization performed by Dr. Martinique 6/17 revealing unchanged anatomy compared to her prior cath.  She was admitted with unstable angina 06/20/2019 and underwent cardiac catheterization by myself revealing a high-grade distal PDA SVG stenosis which I stented with a 3.5 x 20 mm long Synergy drug-eluting stent followed by a 3.5 x 26 resolute Onyx drug-eluting stent.  She did have no reflow which ultimately improved.  She was placed on Angiomax and Aggrastat.  Since discharge she is had no recurrent symptoms.  Essential hypertension History of essential hypertension with blood pressure measured today at 138/87.  She is on metoprolol.      Angela Harp MD FACP,FACC,FAHA, [facility] 08/26/2019 3:28 PM

## 2019-08-26 NOTE — Patient Instructions (Signed)
Medication Instructions:  Your physician recommends that you continue on your current medications as directed. Please refer to the Current Medication list given to you today.  If you need a refill on your cardiac medications before your next appointment, please call your pharmacy.   Lab work: Fasting Lipid and Liver Profile If you have labs (blood work) drawn today and your tests are completely normal, you will receive your results only by: MyChart Message (if you have MyChart) OR A paper copy in the mail If you have any lab test that is abnormal or we need to change your treatment, we will call you to review the results.  Testing/Procedures: NONE  Follow-Up: At [facility], you and your health needs are our priority.  As part of our continuing mission to provide you with exceptional heart care, we have created designated Provider Care Teams.  These Care Teams include your primary Cardiologist (physician) and Advanced Practice Providers (APPs -  Physician Assistants and Nurse Practitioners) who all work together to provide you with the care you need, when you need it. You may see Dr Gwenlyn Found or one of the following Advanced Practice Providers on your designated Care Team:    Kerin Ransom, PA-C  [place], [place]  Coletta Memos, [place]  Your physician wants you to follow-up in: 6 months with Kerin Ransom, [place] wants you to follow-up in: 1 year with Dr Gwenlyn Found

## 2019-08-26 NOTE — Assessment & Plan Note (Signed)
History of hyperlipidemia on high-dose atorvastatin and Praluent.  We will recheck a lipid liver profile.

## 2019-08-27 ENCOUNTER — Ambulatory Visit (HOSPITAL_COMMUNITY): Payer: Medicare Other

## 2019-08-29 ENCOUNTER — Ambulatory Visit (HOSPITAL_COMMUNITY): Payer: Medicare Other

## 2019-09-01 ENCOUNTER — Ambulatory Visit (HOSPITAL_COMMUNITY): Payer: Medicare Other

## 2019-09-03 ENCOUNTER — Ambulatory Visit (HOSPITAL_COMMUNITY): Payer: Medicare Other

## 2019-09-05 ENCOUNTER — Ambulatory Visit (HOSPITAL_COMMUNITY): Payer: Medicare Other

## 2019-09-08 ENCOUNTER — Ambulatory Visit (HOSPITAL_COMMUNITY): Payer: Medicare Other

## 2019-09-10 ENCOUNTER — Ambulatory Visit (HOSPITAL_COMMUNITY): Payer: Medicare Other

## 2019-09-12 ENCOUNTER — Ambulatory Visit (HOSPITAL_COMMUNITY): Payer: Medicare Other

## 2019-09-15 ENCOUNTER — Ambulatory Visit (HOSPITAL_COMMUNITY): Payer: Medicare Other

## 2019-09-17 ENCOUNTER — Ambulatory Visit (HOSPITAL_COMMUNITY): Payer: Medicare Other

## 2019-09-19 ENCOUNTER — Ambulatory Visit (HOSPITAL_COMMUNITY): Payer: Medicare Other

## 2019-09-22 ENCOUNTER — Ambulatory Visit (HOSPITAL_COMMUNITY): Payer: Medicare Other

## 2019-09-22 ENCOUNTER — Other Ambulatory Visit: Payer: Self-pay | Admitting: Cardiovascular Disease

## 2019-09-22 MED ORDER — PRALUENT 150 MG/ML ~~LOC~~ SOAJ: 1.0000 "pen " | SUBCUTANEOUS | 3 refills | Status: DC

## 2019-09-24 ENCOUNTER — Ambulatory Visit (HOSPITAL_COMMUNITY): Payer: Medicare Other

## 2019-09-26 ENCOUNTER — Ambulatory Visit (HOSPITAL_COMMUNITY): Payer: Medicare Other

## 2019-09-29 ENCOUNTER — Ambulatory Visit (HOSPITAL_COMMUNITY): Payer: Medicare Other

## 2019-10-01 ENCOUNTER — Ambulatory Visit (HOSPITAL_COMMUNITY): Payer: Medicare Other

## 2019-10-03 ENCOUNTER — Ambulatory Visit (HOSPITAL_COMMUNITY): Payer: Medicare Other

## 2019-10-06 ENCOUNTER — Ambulatory Visit (HOSPITAL_COMMUNITY): Payer: Medicare Other

## 2019-10-07 ENCOUNTER — Other Ambulatory Visit: Payer: Self-pay | Admitting: Family Medicine

## 2019-10-07 DIAGNOSIS — Z1231 Encounter for screening mammogram for malignant neoplasm of breast: Secondary | ICD-10-CM

## 2019-10-08 ENCOUNTER — Ambulatory Visit (HOSPITAL_COMMUNITY): Payer: Medicare Other

## 2019-10-10 ENCOUNTER — Ambulatory Visit (HOSPITAL_COMMUNITY): Payer: Medicare Other

## 2019-10-22 DIAGNOSIS — I10 Essential (primary) hypertension: Secondary | ICD-10-CM | POA: Diagnosis not present

## 2019-10-22 DIAGNOSIS — E785 Hyperlipidemia, unspecified: Secondary | ICD-10-CM | POA: Diagnosis not present

## 2019-10-23 LAB — LIPID PANEL
Chol/HDL Ratio: 2.1 ratio (ref 0.0–4.4)
Cholesterol, Total: 117 mg/dL (ref 100–199)
HDL: 55 mg/dL (ref >39)
LDL Chol Calc (NIH): 41 mg/dL (ref 0–99)
Triglycerides: 117 mg/dL (ref 0–149)
VLDL Cholesterol Cal: 21 mg/dL (ref 5–40)

## 2019-10-23 LAB — HEPATIC FUNCTION PANEL
ALT: 31 IU/L (ref 0–32)
AST: 27 IU/L (ref 0–40)
Albumin: 4.3 g/dL (ref 3.8–4.9)
Alkaline Phosphatase: 190 IU/L — ABNORMAL HIGH (ref 39–117)
Bilirubin Total: 0.2 mg/dL (ref 0.0–1.2)
Bilirubin, Direct: 0.08 mg/dL (ref 0.00–0.40)
Total Protein: 6.9 g/dL (ref 6.0–8.5)

## 2019-11-03 ENCOUNTER — Other Ambulatory Visit: Payer: Self-pay | Admitting: Cardiovascular Disease

## 2019-11-03 NOTE — Telephone Encounter (Signed)
Called and spoke w/pt regarding their praluent. I instructed the pt to come by and pick up a sample while we work on a new pa.

## 2019-11-03 NOTE — Telephone Encounter (Signed)
*  STAT* If patient is at the pharmacy, call can be transferred to refill team.   1. Which medications need to be refilled? (please list name of each medication and dose if known)  Alirocumab (PRALUENT) 150 MG/ML SOAJ  2. Which pharmacy/location (including street and city if local pharmacy) is medication to be sent to?  [id number] - [place],  - [address]  3. Do they need a 30 day or 90 day supply? 90  Pt said she was unable to pick up a refill and is overdue to give herself the next dose.

## 2019-11-04 DIAGNOSIS — Z0000 Encounter for general adult medical examination without abnormal findings: Secondary | ICD-10-CM | POA: Diagnosis not present

## 2019-11-04 DIAGNOSIS — E559 Vitamin D deficiency, unspecified: Secondary | ICD-10-CM | POA: Diagnosis not present

## 2019-11-04 DIAGNOSIS — J449 Chronic obstructive pulmonary disease, unspecified: Secondary | ICD-10-CM | POA: Diagnosis not present

## 2019-11-04 DIAGNOSIS — R748 Abnormal levels of other serum enzymes: Secondary | ICD-10-CM | POA: Diagnosis not present

## 2019-11-04 DIAGNOSIS — I10 Essential (primary) hypertension: Secondary | ICD-10-CM | POA: Diagnosis not present

## 2019-11-04 DIAGNOSIS — F3170 Bipolar disorder, currently in remission, most recent episode unspecified: Secondary | ICD-10-CM | POA: Diagnosis not present

## 2019-11-04 DIAGNOSIS — R7303 Prediabetes: Secondary | ICD-10-CM | POA: Diagnosis not present

## 2019-11-04 DIAGNOSIS — Z720 Tobacco use: Secondary | ICD-10-CM | POA: Insufficient documentation

## 2019-11-11 ENCOUNTER — Ambulatory Visit: Payer: Medicare Other

## 2019-11-12 ENCOUNTER — Ambulatory Visit
Admission: RE | Admit: 2019-11-12 | Discharge: 2019-11-12 | Disposition: A | Discharge status: Home / Self Care | Payer: Medicare Other | Source: Ambulatory Visit | Attending: Family Medicine | Admitting: Family Medicine

## 2019-11-12 ENCOUNTER — Other Ambulatory Visit: Payer: Self-pay

## 2019-11-12 DIAGNOSIS — Z1231 Encounter for screening mammogram for malignant neoplasm of breast: Secondary | ICD-10-CM

## 2019-11-22 ENCOUNTER — Other Ambulatory Visit: Payer: Self-pay | Admitting: Cardiovascular Disease

## 2019-11-26 ENCOUNTER — Other Ambulatory Visit: Payer: Self-pay | Admitting: Cardiovascular Disease

## 2019-11-26 NOTE — Telephone Encounter (Signed)
Rx request sent to pharmacy.  

## 2019-12-04 ENCOUNTER — Ambulatory Visit: Payer: Medicare Other

## 2020-01-21 ENCOUNTER — Telehealth: Payer: Self-pay | Admitting: Cardiovascular Disease

## 2020-01-21 NOTE — Telephone Encounter (Signed)
I called patient 01/21/20 to schedule follow up visit from [facility] recall list. The patient didn't answer so I left message for them to get that appointment scheduled.

## 2020-01-26 ENCOUNTER — Other Ambulatory Visit: Payer: Self-pay

## 2020-01-26 ENCOUNTER — Telehealth: Payer: Self-pay | Admitting: Cardiovascular Disease

## 2020-01-26 ENCOUNTER — Emergency Department (HOSPITAL_COMMUNITY): Payer: Medicare Other

## 2020-01-26 ENCOUNTER — Emergency Department (HOSPITAL_COMMUNITY)
Admission: EM | Admit: 2020-01-26 | Discharge: 2020-01-26 | Disposition: A | Discharge status: Home / Self Care | Payer: Medicare Other | Attending: Emergency Medicine | Admitting: Emergency Medicine

## 2020-01-26 ENCOUNTER — Encounter (HOSPITAL_COMMUNITY): Payer: Self-pay

## 2020-01-26 DIAGNOSIS — R0902 Hypoxemia: Secondary | ICD-10-CM | POA: Diagnosis not present

## 2020-01-26 DIAGNOSIS — R0789 Other chest pain: Secondary | ICD-10-CM | POA: Diagnosis not present

## 2020-01-26 DIAGNOSIS — Z5321 Procedure and treatment not carried out due to patient leaving prior to being seen by health care provider: Secondary | ICD-10-CM | POA: Diagnosis not present

## 2020-01-26 DIAGNOSIS — R079 Chest pain, unspecified: Secondary | ICD-10-CM | POA: Diagnosis not present

## 2020-01-26 DIAGNOSIS — I10 Essential (primary) hypertension: Secondary | ICD-10-CM | POA: Diagnosis not present

## 2020-01-26 LAB — BASIC METABOLIC PANEL
Anion gap: 8 (ref 5–15)
BUN: 11 mg/dL (ref 6–20)
CO2: 26 mmol/L (ref 22–32)
Calcium: 9.2 mg/dL (ref 8.9–10.3)
Chloride: 107 mmol/L (ref 98–111)
Creatinine, Ser: 0.94 mg/dL (ref 0.44–1.00)
GFR calc Af Amer: >60 mL/min (ref >60)
GFR calc non Af Amer: >60 mL/min (ref >60)
Glucose, Bld: 126 mg/dL — ABNORMAL HIGH (ref 70–99)
Potassium: 3.9 mmol/L (ref 3.5–5.1)
Sodium: 141 mmol/L (ref 135–145)

## 2020-01-26 LAB — CBC
HCT: 42.1 % (ref 36.0–46.0)
Hemoglobin: 13.4 g/dL (ref 12.0–15.0)
MCH: 31.8 pg (ref 26.0–34.0)
MCHC: 31.8 g/dL (ref 30.0–36.0)
MCV: 100 fL (ref 80.0–100.0)
Platelets: 252 10*3/uL (ref 150–400)
RBC: 4.21 MIL/uL (ref 3.87–5.11)
RDW: 15.9 % — ABNORMAL HIGH (ref 11.5–15.5)
WBC: 12.3 10*3/uL — ABNORMAL HIGH (ref 4.0–10.5)
nRBC: 0 % (ref 0.0–0.2)

## 2020-01-26 LAB — I-STAT BETA HCG BLOOD, ED (MC, WL, AP ONLY): I-stat hCG, quantitative: 15.9 m[IU]/mL — ABNORMAL HIGH (ref <5)

## 2020-01-26 LAB — TROPONIN I (HIGH SENSITIVITY): Troponin I (High Sensitivity): 13 ng/L (ref <18)

## 2020-01-26 NOTE — ED Triage Notes (Addendum)
Pt BIB [facility] from home for eval of intermittent CP on exertion. Pt reports CP x 2 weeks, nothing different today. Pt took home SL NTG and 324 ASA. Hx of CABG x 3 and 4-5 stents (Nov 2020). Pt reports associated N/V, SOB, diaphoresis. Pt asking during triage if "we are going to check her enzymes because she knows she's had a heart attack". Reassurance provided that we have drawn bloodwork and ran it

## 2020-01-26 NOTE — Telephone Encounter (Signed)
Received call from patient from operator- patient is currently in the ED waiting room after being brought in by ambulance- they have drawn blood, completed EKG, and are awaiting results from blood work. Patient states she has had this chest pain for a few weeks and it has worsened which is why she called EMS. She states that she is not going to continue to wait there, I did advise with patient that if she was having active chest pains/tightness that we would suggest for her to go to ED as she was already there she needed to stay there. Our office was not equipped to deal with an active chest pain patient. She verbalized that she understood this. She then stated that she was not going to wait and was going to leave from there. I did advise with patient again that she needed to at least wait for her blood work to see what those Troponin levels were, and her blood counts so we could know what was going on. Patient verbalized then that she would wait.

## 2020-01-26 NOTE — ED Notes (Signed)
Pt stated that she was going to her primary care provider and did not wish to wait any longer here. IV Removed.

## 2020-01-26 NOTE — Progress Notes (Signed)
Informed pt that she would be going to wait in the lobby, and pt statedshe did not want to go out there. Informed her that bloodwork, EKG and vital signs were obtained back here and in process. Pt stated she had never been in a waiting room before. Encouraged her to keep mask on and distance from others. Pt stated "Fuck this I'm calling my mom, I'm not going to no fucking lobby". Advised pt that if she would be leaving I would need to remove her IV. Attempted to remove IV and pt stated "Just get the fuck away from me I'll go fucking wait" . Advised pt that if she were to leave, the IV must be removed or we are obligated to notify the police. Pt stated "Get out of my way I'll go sit out there". Pt now seated in lobby.

## 2020-01-26 NOTE — Telephone Encounter (Signed)
Pt c/o of Chest Pain: STAT if CP now or developed within 24 hours  1. Are you having CP right now? Yes, chest heaviness  2. Are you experiencing any other symptoms (ex. SOB, nausea, vomiting, sweating)? Sweating, nausea, SOB  3. How long have you been experiencing CP? 1 week and a half  4. Is your CP continuous or coming and going? Comes and goes  5. Have you taken Nitroglycerin? Yes, had one this morning  Patient states she is having a chest heaviness and is at the ED now. She states she is waiting and there are 40 people there.   ?

## 2020-02-25 ENCOUNTER — Ambulatory Visit: Payer: Medicare Other | Admitting: Cardiovascular Disease

## 2020-02-29 ENCOUNTER — Emergency Department (HOSPITAL_COMMUNITY): Payer: Medicare Other

## 2020-02-29 ENCOUNTER — Inpatient Hospital Stay (HOSPITAL_COMMUNITY)
Admission: EM | Admit: 2020-02-29 | Discharge: 2020-03-02 | DRG: 281 | Disposition: A | Discharge status: Home / Self Care | Payer: Medicare Other | Attending: Internal Medicine | Admitting: Internal Medicine

## 2020-02-29 ENCOUNTER — Other Ambulatory Visit: Payer: Self-pay

## 2020-02-29 ENCOUNTER — Encounter (HOSPITAL_COMMUNITY): Payer: Self-pay | Admitting: Emergency Medicine

## 2020-02-29 DIAGNOSIS — I2510 Atherosclerotic heart disease of native coronary artery without angina pectoris: Secondary | ICD-10-CM | POA: Diagnosis not present

## 2020-02-29 DIAGNOSIS — Z801 Family history of malignant neoplasm of trachea, bronchus and lung: Secondary | ICD-10-CM

## 2020-02-29 DIAGNOSIS — E876 Hypokalemia: Secondary | ICD-10-CM | POA: Diagnosis not present

## 2020-02-29 DIAGNOSIS — F319 Bipolar disorder, unspecified: Secondary | ICD-10-CM | POA: Diagnosis not present

## 2020-02-29 DIAGNOSIS — Z7982 Long term (current) use of aspirin: Secondary | ICD-10-CM | POA: Diagnosis not present

## 2020-02-29 DIAGNOSIS — I25119 Atherosclerotic heart disease of native coronary artery with unspecified angina pectoris: Secondary | ICD-10-CM | POA: Diagnosis present

## 2020-02-29 DIAGNOSIS — R140 Abdominal distension (gaseous): Secondary | ICD-10-CM | POA: Diagnosis present

## 2020-02-29 DIAGNOSIS — Z955 Presence of coronary angioplasty implant and graft: Secondary | ICD-10-CM | POA: Diagnosis not present

## 2020-02-29 DIAGNOSIS — Z7902 Long term (current) use of antithrombotics/antiplatelets: Secondary | ICD-10-CM

## 2020-02-29 DIAGNOSIS — E785 Hyperlipidemia, unspecified: Secondary | ICD-10-CM | POA: Diagnosis present

## 2020-02-29 DIAGNOSIS — F17200 Nicotine dependence, unspecified, uncomplicated: Secondary | ICD-10-CM | POA: Diagnosis not present

## 2020-02-29 DIAGNOSIS — I25719 Atherosclerosis of autologous vein coronary artery bypass graft(s) with unspecified angina pectoris: Secondary | ICD-10-CM | POA: Diagnosis not present

## 2020-02-29 DIAGNOSIS — K2970 Gastritis, unspecified, without bleeding: Secondary | ICD-10-CM | POA: Diagnosis present

## 2020-02-29 DIAGNOSIS — R079 Chest pain, unspecified: Secondary | ICD-10-CM | POA: Diagnosis not present

## 2020-02-29 DIAGNOSIS — I25810 Atherosclerosis of coronary artery bypass graft(s) without angina pectoris: Secondary | ICD-10-CM | POA: Diagnosis present

## 2020-02-29 DIAGNOSIS — I214 Non-ST elevation (NSTEMI) myocardial infarction: Principal | ICD-10-CM | POA: Diagnosis present

## 2020-02-29 DIAGNOSIS — I252 Old myocardial infarction: Secondary | ICD-10-CM | POA: Diagnosis not present

## 2020-02-29 DIAGNOSIS — E669 Obesity, unspecified: Secondary | ICD-10-CM | POA: Diagnosis not present

## 2020-02-29 DIAGNOSIS — Z7951 Long term (current) use of inhaled steroids: Secondary | ICD-10-CM

## 2020-02-29 DIAGNOSIS — J449 Chronic obstructive pulmonary disease, unspecified: Secondary | ICD-10-CM | POA: Diagnosis present

## 2020-02-29 DIAGNOSIS — Z885 Allergy status to narcotic agent status: Secondary | ICD-10-CM

## 2020-02-29 DIAGNOSIS — I2583 Coronary atherosclerosis due to lipid rich plaque: Secondary | ICD-10-CM | POA: Diagnosis present

## 2020-02-29 DIAGNOSIS — I4510 Unspecified right bundle-branch block: Secondary | ICD-10-CM | POA: Diagnosis not present

## 2020-02-29 DIAGNOSIS — Z96651 Presence of right artificial knee joint: Secondary | ICD-10-CM | POA: Diagnosis present

## 2020-02-29 DIAGNOSIS — Z8249 Family history of ischemic heart disease and other diseases of the circulatory system: Secondary | ICD-10-CM

## 2020-02-29 DIAGNOSIS — Z6832 Body mass index (BMI) 32.0-32.9, adult: Secondary | ICD-10-CM

## 2020-02-29 DIAGNOSIS — G4733 Obstructive sleep apnea (adult) (pediatric): Secondary | ICD-10-CM | POA: Diagnosis present

## 2020-02-29 DIAGNOSIS — I10 Essential (primary) hypertension: Secondary | ICD-10-CM | POA: Diagnosis present

## 2020-02-29 DIAGNOSIS — Z880 Allergy status to penicillin: Secondary | ICD-10-CM

## 2020-02-29 DIAGNOSIS — Z20822 Contact with and (suspected) exposure to covid-19: Secondary | ICD-10-CM | POA: Diagnosis present

## 2020-02-29 DIAGNOSIS — H409 Unspecified glaucoma: Secondary | ICD-10-CM | POA: Diagnosis present

## 2020-02-29 DIAGNOSIS — E7800 Pure hypercholesterolemia, unspecified: Secondary | ICD-10-CM | POA: Diagnosis present

## 2020-02-29 DIAGNOSIS — R109 Unspecified abdominal pain: Secondary | ICD-10-CM | POA: Diagnosis not present

## 2020-02-29 DIAGNOSIS — Z79899 Other long term (current) drug therapy: Secondary | ICD-10-CM

## 2020-02-29 DIAGNOSIS — I25700 Atherosclerosis of coronary artery bypass graft(s), unspecified, with unstable angina pectoris: Secondary | ICD-10-CM | POA: Diagnosis not present

## 2020-02-29 DIAGNOSIS — I25118 Atherosclerotic heart disease of native coronary artery with other forms of angina pectoris: Secondary | ICD-10-CM | POA: Diagnosis present

## 2020-02-29 LAB — COMPREHENSIVE METABOLIC PANEL
ALT: 20 U/L (ref 0–44)
AST: 21 U/L (ref 15–41)
Albumin: 4.2 g/dL (ref 3.5–5.0)
Alkaline Phosphatase: 117 U/L (ref 38–126)
Anion gap: 15 (ref 5–15)
BUN: 23 mg/dL — ABNORMAL HIGH (ref 6–20)
CO2: 23 mmol/L (ref 22–32)
Calcium: 9.3 mg/dL (ref 8.9–10.3)
Chloride: 100 mmol/L (ref 98–111)
Creatinine, Ser: 1.13 mg/dL — ABNORMAL HIGH (ref 0.44–1.00)
GFR calc Af Amer: >60 mL/min (ref >60)
GFR calc non Af Amer: 54 mL/min — ABNORMAL LOW (ref >60)
Glucose, Bld: 144 mg/dL — ABNORMAL HIGH (ref 70–99)
Potassium: 3.4 mmol/L — ABNORMAL LOW (ref 3.5–5.1)
Sodium: 138 mmol/L (ref 135–145)
Total Bilirubin: 1 mg/dL (ref 0.3–1.2)
Total Protein: 7.9 g/dL (ref 6.5–8.1)

## 2020-02-29 LAB — CBC
HCT: 40.9 % (ref 36.0–46.0)
Hemoglobin: 13.9 g/dL (ref 12.0–15.0)
MCH: 31.9 pg (ref 26.0–34.0)
MCHC: 34 g/dL (ref 30.0–36.0)
MCV: 93.8 fL (ref 80.0–100.0)
Platelets: 301 10*3/uL (ref 150–400)
RBC: 4.36 MIL/uL (ref 3.87–5.11)
RDW: 14.7 % (ref 11.5–15.5)
WBC: 16.6 10*3/uL — ABNORMAL HIGH (ref 4.0–10.5)
nRBC: 0 % (ref 0.0–0.2)

## 2020-02-29 LAB — TROPONIN I (HIGH SENSITIVITY)
Troponin I (High Sensitivity): 103 ng/L (ref <18)
Troponin I (High Sensitivity): 84 ng/L — ABNORMAL HIGH (ref <18)

## 2020-02-29 LAB — PROTIME-INR
INR: 1 (ref 0.8–1.2)
Prothrombin Time: 13.1 seconds (ref 11.4–15.2)

## 2020-02-29 LAB — LIPASE, BLOOD: Lipase: 37 U/L (ref 11–51)

## 2020-02-29 LAB — APTT: aPTT: 30 seconds (ref 24–36)

## 2020-02-29 MED ORDER — HEPARIN (PORCINE) 25000 UT/250ML-% IV SOLN
1050.0000 [IU]/h | INTRAVENOUS | Status: DC
  Administered 2020-02-29: 1050 [IU]/h via INTRAVENOUS

## 2020-02-29 MED ORDER — ALUM & MAG HYDROXIDE-SIMETH 200-200-20 MG/5ML PO SUSP
30.0000 mL | Freq: Once | ORAL | Status: AC
  Administered 2020-02-29: 30 mL via ORAL
  Filled 2020-02-29: qty 30

## 2020-02-29 MED ORDER — VITAMIN D 25 MCG (1000 UNIT) PO TABS
1000.0000 [IU] | ORAL_TABLET | Freq: Every day | ORAL | Status: DC
  Administered 2020-03-01 – 2020-03-02 (×2): 1000 [IU] via ORAL
  Filled 2020-02-29 (×2): qty 1

## 2020-02-29 MED ORDER — METOCLOPRAMIDE HCL 5 MG/ML IJ SOLN
10.0000 mg | Freq: Once | INTRAMUSCULAR | Status: AC
  Administered 2020-02-29: 10 mg via INTRAVENOUS
  Filled 2020-02-29: qty 2

## 2020-02-29 MED ORDER — HEPARIN BOLUS VIA INFUSION
4000.0000 [IU] | Freq: Once | INTRAVENOUS | Status: DC
  Administered 2020-02-29: 4000 [IU] via INTRAVENOUS
  Filled 2020-02-29: qty 4000

## 2020-02-29 MED ORDER — LIDOCAINE VISCOUS HCL 2 % MT SOLN
15.0000 mL | Freq: Once | OROMUCOSAL | Status: AC
  Administered 2020-02-29: 15 mL via ORAL
  Filled 2020-02-29: qty 15

## 2020-02-29 MED ORDER — HYOSCYAMINE SULFATE 0.125 MG SL SUBL
0.2500 mg | SUBLINGUAL_TABLET | Freq: Once | SUBLINGUAL | Status: AC
  Administered 2020-02-29: 0.25 mg via SUBLINGUAL
  Filled 2020-02-29: qty 2

## 2020-02-29 MED ORDER — SODIUM CHLORIDE 0.9 % IV SOLN: INTRAVENOUS | Status: DC

## 2020-02-29 MED ORDER — ESCITALOPRAM OXALATE 10 MG PO TABS
10.0000 mg | ORAL_TABLET | Freq: Every day | ORAL | Status: DC
  Administered 2020-03-01 – 2020-03-02 (×2): 10 mg via ORAL
  Filled 2020-02-29 (×2): qty 1

## 2020-02-29 MED ORDER — FLUTICASONE PROPIONATE 50 MCG/ACT NA SUSP
1.0000 | Freq: Every day | NASAL | Status: DC
  Administered 2020-03-01 – 2020-03-02 (×2): 1 via NASAL
  Filled 2020-02-29: qty 16

## 2020-02-29 MED ORDER — NITROGLYCERIN 0.4 MG SL SUBL: 0.4000 mg | SUBLINGUAL_TABLET | SUBLINGUAL | Status: DC | PRN

## 2020-02-29 MED ORDER — METOPROLOL TARTRATE 12.5 MG HALF TABLET
12.5000 mg | ORAL_TABLET | Freq: Two times a day (BID) | ORAL | Status: DC
  Administered 2020-03-01 – 2020-03-02 (×2): 12.5 mg via ORAL
  Filled 2020-02-29 (×3): qty 1

## 2020-02-29 MED ORDER — SODIUM CHLORIDE 0.9% FLUSH
3.0000 mL | Freq: Once | INTRAVENOUS | Status: AC
  Administered 2020-02-29: 3 mL via INTRAVENOUS

## 2020-02-29 MED ORDER — ICOSAPENT ETHYL 1 G PO CAPS
2.0000 g | ORAL_CAPSULE | Freq: Two times a day (BID) | ORAL | Status: DC
  Administered 2020-03-01 – 2020-03-02 (×4): 2 g via ORAL
  Filled 2020-02-29 (×5): qty 2

## 2020-02-29 MED ORDER — ASPIRIN EC 81 MG PO TBEC
81.0000 mg | DELAYED_RELEASE_TABLET | Freq: Every day | ORAL | Status: DC
  Administered 2020-03-01: 81 mg via ORAL
  Filled 2020-02-29: qty 1

## 2020-02-29 MED ORDER — LAMOTRIGINE 150 MG PO TABS
150.0000 mg | ORAL_TABLET | Freq: Every day | ORAL | Status: DC
  Administered 2020-03-01 – 2020-03-02 (×2): 150 mg via ORAL
  Filled 2020-02-29: qty 1
  Filled 2020-02-29: qty 2

## 2020-02-29 MED ORDER — NITROGLYCERIN IN D5W 200-5 MCG/ML-% IV SOLN
0.0000 ug/min | INTRAVENOUS | Status: DC
  Administered 2020-02-29: 5 ug/min via INTRAVENOUS
  Filled 2020-02-29: qty 250

## 2020-02-29 MED ORDER — ASPIRIN 81 MG PO CHEW
324.0000 mg | CHEWABLE_TABLET | ORAL | Status: AC
  Administered 2020-03-01: 324 mg via ORAL
  Filled 2020-02-29: qty 4

## 2020-02-29 MED ORDER — ASPIRIN 300 MG RE SUPP: 300.0000 mg | RECTAL | Status: AC

## 2020-02-29 MED ORDER — ALBUTEROL SULFATE (2.5 MG/3ML) 0.083% IN NEBU: 2.5000 mg | INHALATION_SOLUTION | Freq: Four times a day (QID) | RESPIRATORY_TRACT | Status: DC | PRN

## 2020-02-29 MED ORDER — METOPROLOL TARTRATE 25 MG PO TABS
12.5000 mg | ORAL_TABLET | Freq: Once | ORAL | Status: AC
  Administered 2020-03-01: 12.5 mg via ORAL
  Filled 2020-02-29: qty 1

## 2020-02-29 MED ORDER — ASPIRIN 81 MG PO CHEW: 81.0000 mg | CHEWABLE_TABLET | Freq: Every day | ORAL | Status: DC

## 2020-02-29 MED ORDER — ACETAMINOPHEN 325 MG PO TABS
650.0000 mg | ORAL_TABLET | ORAL | Status: DC | PRN
  Administered 2020-03-01: 650 mg via ORAL
  Filled 2020-02-29: qty 2

## 2020-02-29 MED ORDER — SODIUM CHLORIDE 0.9 % IV BOLUS
500.0000 mL | Freq: Once | INTRAVENOUS | Status: AC
  Administered 2020-02-29: 500 mL via INTRAVENOUS

## 2020-02-29 MED ORDER — ATORVASTATIN CALCIUM 80 MG PO TABS
80.0000 mg | ORAL_TABLET | Freq: Every day | ORAL | Status: DC
  Administered 2020-03-01 – 2020-03-02 (×2): 80 mg via ORAL
  Filled 2020-02-29: qty 2
  Filled 2020-02-29: qty 1

## 2020-02-29 MED ORDER — FLUTICASONE FUROATE-VILANTEROL 100-25 MCG/INH IN AEPB
1.0000 | INHALATION_SPRAY | Freq: Every day | RESPIRATORY_TRACT | Status: DC
  Administered 2020-03-01 – 2020-03-02 (×2): 1 via RESPIRATORY_TRACT
  Filled 2020-02-29: qty 28

## 2020-02-29 MED ORDER — CLOPIDOGREL BISULFATE 75 MG PO TABS
75.0000 mg | ORAL_TABLET | Freq: Every day | ORAL | Status: DC
  Administered 2020-03-01 – 2020-03-02 (×2): 75 mg via ORAL
  Filled 2020-02-29 (×3): qty 1

## 2020-02-29 MED ORDER — QUETIAPINE FUMARATE 50 MG PO TABS
125.0000 mg | ORAL_TABLET | Freq: Every day | ORAL | Status: DC
  Administered 2020-03-01 (×2): 125 mg via ORAL
  Filled 2020-02-29 (×2): qty 1

## 2020-02-29 MED ORDER — FAMOTIDINE 20 MG PO TABS
40.0000 mg | ORAL_TABLET | Freq: Once | ORAL | Status: AC
  Administered 2020-02-29: 40 mg via ORAL
  Filled 2020-02-29: qty 2

## 2020-02-29 MED ORDER — ONDANSETRON HCL 4 MG/2ML IJ SOLN: 4.0000 mg | Freq: Four times a day (QID) | INTRAMUSCULAR | Status: DC | PRN

## 2020-02-29 MED ORDER — BRIMONIDINE TARTRATE 0.15 % OP SOLN
1.0000 [drp] | Freq: Two times a day (BID) | OPHTHALMIC | Status: DC
  Administered 2020-03-01 – 2020-03-02 (×3): 1 [drp] via OPHTHALMIC
  Filled 2020-02-29: qty 5

## 2020-02-29 MED ORDER — HEPARIN (PORCINE) 25000 UT/250ML-% IV SOLN
1250.0000 [IU]/h | INTRAVENOUS | Status: DC
  Administered 2020-02-29: 1250 [IU]/h via INTRAVENOUS
  Filled 2020-02-29: qty 250

## 2020-02-29 NOTE — Progress Notes (Addendum)
ANTICOAGULATION CONSULT NOTE - Initial Consult  Pharmacy Consult for Heparin Indication: VTE treatment ==> ACS  Allergies  Allergen Reactions  . Benadryl [Diphenhydramine Hcl] Shortness Of Breath and Swelling    Tongue swelling  . Codeine Other (See Comments)    "makes me psychotic"   . Haldol [Haloperidol Decanoate] Anaphylaxis  . Amoxicillin Hives and Swelling    Swelling to extremities Did it involve swelling of the face/tongue/throat, SOB, or low BP? Yes Did it involve sudden or severe rash/hives, skin peeling, or any reaction on the inside of your mouth or nose? Yes Did you need to seek medical attention at a hospital or doctor's office? Yes When did it last happen?young adult If all above answers are "NO", may proceed with cephalosporin use.  . Bee Venom Swelling and Other (See Comments)    "pimples" from head to toe and whole body swelling    Patient Measurements: Height: 5\' 5"  (165.1 cm) Weight: 88 kg (194 lb) IBW/kg (Calculated) : 57 Heparin Dosing Weight: 76 kg  Vital Signs: Temp: 98.3 F (36.8 C) (07/11 1956) Temp Source: Oral (07/11 1956) BP: 131/117 (07/11 2159) Pulse Rate: 70 (07/11 2159)  Labs: Recent Labs    02/29/20 2025 02/29/20 2042  HGB 13.9  --   HCT 40.9  --   PLT 301  --   CREATININE  --  1.13*  TROPONINIHS 103*  --     Estimated Creatinine Clearance: 59.5 mL/min (A) (by C-G formula based on SCr of 1.13 mg/dL (H)).   Medical History: Past Medical History:  Diagnosis Date  . Anemia    takes iron supplement  . Arthritis    knees, shoulders  . Bipolar 1 disorder ([place])   . Cataract, immature   . CHF (congestive heart failure) ([place])   . COPD (chronic obstructive pulmonary disease) ([place])   . Coronary artery disease    triple vessel  . Family history of adverse reaction to anesthesia    states father "did not do well" with anesthesia; died last time he had surgery; also had heart disease  . Gallstones   . GERD (gastroesophageal  reflux disease)    occasional - no current med.  . Glaucoma   . Hammertoe 05/2015   right second toe  . Hepatic steatosis   . High cholesterol   . History of MI (myocardial infarction)   . Hypertension    states under control with meds., has been on med. x 8 yr.  . Panic attacks   . Peripheral vascular disease ([place])    carotid  . Renal cyst, left   . Schizophrenia ([place])   . Short of breath on exertion   . Sleep apnea    no CPAP use  . Urinary, incontinence, stress female   . Wears dentures    upper  . Wears partial dentures    lower    Medications:  PTA on no oral anticoagulation  Assessment:  59 yr female presents with c/o abdominal and chest pain  PMH significant for CAD, bipolar disorder, CHF  Pharmacy consulted to dose heparin for VTE treatment  Goal of Therapy:  Heparin level 0.3-0.7 units/ml Monitor platelets by anticoagulation protocol: Yes   Plan:   Obtain baseline aPTT and PT/INR  Heparin 4000 unit IV bolus x 1 followed by heparin gtt @ 1250 units/hr  Check heparin level 6 hr after heparin gtt started  Follow heparin level and CBC daily while on heparin gtt  Lazaria Schaben, Toribio Harbour, PharmD 02/29/2020,10:36 PM  ADDENDUM: Admitting MD has changed the indication for heparin gtt to ACS.  Patient has received Heparin 4000 units IV bolus x 1 (still appropriate) however will reduce infusion rate to 1050 units/hr per protocol.  Will check heparin level in 6 hr  Leone Haven, PharmD 02/29/20 @ 23:10

## 2020-02-29 NOTE — H&P (Signed)
History and Physical    [name] BZJ:[id number] DOB: [birth year] DOA: 02/29/2020  PCP: Macy Mis, MD  Patient coming from: Home  I have personally briefly reviewed patient's old medical records in [facility]  Chief Complaint: CP  HPI: [name] is a 59 y.o. female with medical history significant of triple vessel CAD, prior MI, S/P CABG x3, s/p multiple stents.  Most recently had PCI with DES to graft to RPDA in Nov 2020.  Pt presents to ED with several month history of abd discomfort radiating to chest.  Symptoms worse after she eats.  NBNB emesis.  Symptoms were waxing and waning but over the past couple of days symptoms became worse and started radiating to jaw and arm.  These symptoms are c/w prior MI.  This prompted her to go in to ED.   ED Course: Concerning is that her trop is positive at 103.  She does NOT have a history of chronically positive troponins, Trop last month was 73 and trop Last Oct (before stent) was in the 20s.  Pt started on heparin gtt and NTG gtt.  Cards consulted.  No SD[facility] beds currently available at [facility].   Review of Systems: As per HPI, otherwise all review of systems negative.  Past Medical History:  Diagnosis Date  . Anemia    takes iron supplement  . Arthritis    knees, shoulders  . Bipolar 1 disorder (HCC)   . Cataract, immature   . CHF (congestive heart failure) (HCC)   . COPD (chronic obstructive pulmonary disease) (HCC)   . Coronary artery disease    triple vessel  . Family history of adverse reaction to anesthesia    states father "did not do well" with anesthesia; died last time he had surgery; also had heart disease  . Gallstones   . GERD (gastroesophageal reflux disease)    occasional - no current med.  . Glaucoma   . Hammertoe 05/2015   right second toe  . Hepatic steatosis   . High cholesterol   . History of MI (myocardial infarction)   . Hypertension    states under control with meds., has been on  med. x 8 yr.  . Panic attacks   . Peripheral vascular disease (HCC)    carotid  . Renal cyst, left   . Schizophrenia (HCC)   . Short of breath on exertion   . Sleep apnea    no CPAP use  . Urinary, incontinence, stress female   . Wears dentures    upper  . Wears partial dentures    lower    Past Surgical History:  Procedure Laterality Date  . C-SECTIONS X 2  1985, 1988  . CARDIAC CATHETERIZATION  12/10/2006; 12/18/2006; 07/10/2007; 07/19/2009  . CARDIAC CATHETERIZATION  03/04/2010   patent grafts, normal LV function, diffusely diseased RCA (Dr. Erlene Quan)  . CARDIAC CATHETERIZATION N/A 02/15/2016   Procedure: Left Heart Cath and Cors/Grafts Angiography;  Surgeon: Peter M Swaziland, MD;  Location[facility] INVASIVE CV LAB;  Service: Cardiovascular;  Laterality: N/A;  . CLUB FOOT RELEASE Bilateral    1st and 2nd toes  . CORONARY ARTERY BYPASS GRAFT  07/16/2007   LIMA to LAD, VG to ramus, VG to PDA   . CORONARY STENT INTERVENTION N/A 06/23/2019   Procedure: CORONARY STENT INTERVENTION;  Surgeon: Runell Gess, MD;  Location: [facility] INVASIVE CV LAB;  Service: Cardiovascular;  Laterality: N/A;  . FLEXIBLE BRONCHOSCOPY  07/16/2007  . HAMMERTOE  RECONSTRUCTION WITH WEIL OSTEOTOMY Right 06/03/2015   Procedure: RIGHT SECOND HAMMERTOE CORRECTION WITH MT WEIL OSTEOTOMY;  Surgeon: Toni Arthurs, MD;  Location: [facility];  Service: Orthopedics;  Laterality: Right;  . HAND SURGERY Bilateral    1st and 2nd fingers - release of clubbing  . KNEE ARTHROSCOPY WITH ANTERIOR CRUCIATE LIGAMENT (ACL) REPAIR Right 01/30/2008  . LEFT HEART CATH AND CORS/GRAFTS ANGIOGRAPHY N/A 06/23/2019   Procedure: LEFT HEART CATH AND CORS/GRAFTS ANGIOGRAPHY;  Surgeon: Runell Gess, MD;  Location: [facility] INVASIVE CV LAB;  Service: Cardiovascular;  Laterality: N/A;  . NOVASURE ABLATION  12/19/2007   with removal of IUD  . ORIF FOOT FRACTURE Left   . TOE SURGERIES TO REMOVE NAILS DUE TO CLUBBING    . TOTAL KNEE  ARTHROPLASTY Right 10/21/2012   Procedure: TOTAL KNEE ARTHROPLASTY;  Surgeon: Shelda Pal, MD;  Location: [facility];  Service: Orthopedics;  Laterality: Right;     reports that she quit smoking about 4 years ago. She smoked 0.00 packs per day for 0.00 years. She has never used smokeless tobacco. She reports that she does not drink alcohol and does not use drugs.  Allergies  Allergen Reactions  . Benadryl [Diphenhydramine Hcl] Shortness Of Breath and Swelling    Tongue swelling  . Codeine Other (See Comments)    "makes me psychotic"   . Haldol [Haloperidol Decanoate] Anaphylaxis  . Amoxicillin Hives and Swelling    Swelling to extremities Did it involve swelling of the face/tongue/throat, SOB, or low BP? Yes Did it involve sudden or severe rash/hives, skin peeling, or any reaction on the inside of your mouth or nose? Yes Did you need to seek medical attention at a hospital or doctor's office? Yes When did it last happen?young adult If all above answers are "NO", may proceed with cephalosporin use.  . Bee Venom Swelling and Other (See Comments)    "pimples" from head to toe and whole body swelling    Family History  Problem Relation Age of Onset  . Heart disease Father   . Anesthesia problems Father        states "did not do well" with anesthesia  . Lung cancer Mother      Prior to Admission medications   Medication Sig Start Date End Date Taking? Authorizing Provider  albuterol (PROVENTIL HFA;VENTOLIN HFA) 108 (90 BASE) MCG/ACT inhaler Inhale 2 puffs into the lungs every 6 (six) hours as needed for wheezing or shortness of breath.    Yes [provider]  Alirocumab (PRALUENT) 150 MG/ML SOAJ Inject 1 pen into the skin every 14 (fourteen) days. 09/22/19  Yes Runell Gess, MD  aspirin 81 MG chewable tablet Chew 81 mg by mouth daily.   Yes [provider]  atorvastatin (LIPITOR) 80 MG tablet Take 1 tablet (80 mg total) by mouth daily. Need to make an appt  for overdue visit 06/30/19  Yes Lyn Records, MD  BREO ELLIPTA 100-25 MCG/INH AEPB Inhale 1 puff into the lungs daily.  07/29/19  Yes [provider]  brimonidine (ALPHAGAN) 0.15 % ophthalmic solution Place 1 drop into both eyes 2 (two) times daily.    Yes [provider]  cholecalciferol (VITAMIN D3) 25 MCG (1000 UNIT) tablet Take 1,000 Units by mouth daily.   Yes [provider]  clopidogrel (PLAVIX) 75 MG tablet Take 1 tablet (75 mg total) by mouth daily with breakfast. 06/26/19  Yes Furth, Cadence H, PA-C  escitalopram (LEXAPRO) 10 MG tablet  Take 10 mg by mouth daily. 05/31/19  Yes [provider]  fluticasone (FLONASE) 50 MCG/ACT nasal spray Place 1 spray into both nostrils daily. 06/13/19  Yes [provider]  isosorbide mononitrate (IMDUR) 30 MG 24 hr tablet Take 30 mg by mouth daily.   Yes [provider]  lamoTRIgine (LAMICTAL) 150 MG tablet Take 150 mg by mouth daily.  08/05/19  Yes [provider]  metoprolol tartrate (LOPRESSOR) 25 MG tablet Take 0.5 tablets (12.5 mg total) by mouth 2 (two) times daily. 06/25/19  Yes Furth, Cadence H, PA-C  niacin (NIASPAN) 1000 MG CR tablet TAKE 1 TABLET (1,000 MG TOTAL) BY MOUTH AT BEDTIME. Patient taking differently: Take 1,000 mg by mouth at bedtime.  02/07/19  Yes Runell Gess, MD  nitroGLYCERIN (NITROSTAT) 0.4 MG SL tablet Place 1 tablet (0.4 mg total) under the tongue every 5 (five) minutes as needed for chest pain. 07/03/19  Yes Kilroy, Luke K, PA-C  ondansetron (ZOFRAN-ODT) 8 MG disintegrating tablet Take 8 mg by mouth every 8 (eight) hours as needed for nausea or vomiting.  02/26/20  Yes [provider]  QUEtiapine (SEROQUEL) 200 MG tablet Take 125 mg by mouth at bedtime. Takes 1 1/4 tablet (125 mg) 05/31/19  Yes [provider]  VASCEPA 1 g capsule TAKE 2 CAPSULES (2 G TOTAL) BY MOUTH 2 (TWO) TIMES DAILY. 11/26/19  Yes Runell Gess, MD  vitamin B-12  (CYANOCOBALAMIN) 1000 MCG tablet Take 1,000 mcg by mouth daily.   Yes [provider]    Physical Exam: Vitals:   02/29/20 1956 02/29/20 2159  BP: (!) 170/108 (!) 131/117  Pulse: 71 70  Resp: (!) 22 18  Temp: 98.3 F (36.8 C)   TempSrc: Oral   SpO2: 98% 97%  Weight: 88 kg   Height: 5\' 5"  (1.651 m)     Constitutional: NAD, calm, comfortable Eyes: PERRL, lids and conjunctivae normal ENMT: Mucous membranes are moist. Posterior pharynx clear of any exudate or lesions.Normal dentition.  Neck: normal, supple, no masses, no thyromegaly Respiratory: clear to auscultation bilaterally, no wheezing, no crackles. Normal respiratory effort. No accessory muscle use.  Cardiovascular: Regular rate and rhythm, no murmurs / rubs / gallops. No extremity edema. 2+ pedal pulses. No carotid bruits.  Abdomen: no tenderness, no masses palpated. No hepatosplenomegaly. Bowel sounds positive.  Musculoskeletal: no clubbing / cyanosis. No joint deformity upper and lower extremities. Good ROM, no contractures. Normal muscle tone.  Skin: no rashes, lesions, ulcers. No induration Neurologic: CN 2-12 grossly intact. Sensation intact, DTR normal. Strength 5/5 in all 4.  Psychiatric: Normal judgment and insight. Alert and oriented x 3. Normal mood.    Labs on Admission: I have personally reviewed following labs and imaging studies  CBC: Recent Labs  Lab 02/29/20 2025  WBC 16.6*  HGB 13.9  HCT 40.9  MCV 93.8  PLT 301   Basic Metabolic Panel: Recent Labs  Lab 02/29/20 2042  NA 138  K 3.4*  CL 100  CO2 23  GLUCOSE 144*  BUN 23*  CREATININE 1.13*  CALCIUM 9.3   GFR: Estimated Creatinine Clearance: 59.5 mL/min (A) (by C-G formula based on SCr of 1.13 mg/dL (H)). Liver Function Tests: Recent Labs  Lab 02/29/20 2042  AST 21  ALT 20  ALKPHOS 117  BILITOT 1.0  PROT 7.9  ALBUMIN 4.2   Recent Labs  Lab 02/29/20 2042  LIPASE 37   No results for input(s): AMMONIA in the last 168  hours. Coagulation  Profile: Recent Labs  Lab 02/29/20 2225  INR 1.0   Cardiac Enzymes: No results for input(s): CKTOTAL, CKMB, CKMBINDEX, TROPONINI in the last 168 hours. BNP (last 3 results) No results for input(s): PROBNP in the last 8760 hours. HbA1C: No results for input(s): HGBA1C in the last 72 hours. CBG: No results for input(s): GLUCAP in the last 168 hours. Lipid Profile: No results for input(s): CHOL, HDL, LDLCALC, TRIG, CHOLHDL, LDLDIRECT in the last 72 hours. Thyroid Function Tests: No results for input(s): TSH, T4TOTAL, FREET4, T3FREE, THYROIDAB in the last 72 hours. Anemia Panel: No results for input(s): VITAMINB12, FOLATE, FERRITIN, TIBC, IRON, RETICCTPCT in the last 72 hours. Urine analysis:    Component Value Date/Time   COLORURINE YELLOW 06/24/2014 1835   APPEARANCEUR CLOUDY (A) 06/24/2014 1835   LABSPEC 1.021 06/24/2014 1835   PHURINE 5.5 06/24/2014 1835   GLUCOSEU NEGATIVE 06/24/2014 1835   HGBUR SMALL (A) 06/24/2014 1835   BILIRUBINUR NEGATIVE 06/24/2014 1835   KETONESUR NEGATIVE 06/24/2014 1835   PROTEINUR NEGATIVE 06/24/2014 1835   UROBILINOGEN 0.2 06/24/2014 1835   NITRITE NEGATIVE 06/24/2014 1835   LEUKOCYTESUR NEGATIVE 06/24/2014 1835    Radiological Exams on Admission: DG Chest 2 View  Result Date: 02/29/2020 CLINICAL DATA:  Chest pressure, abdominal pain EXAM: CHEST - 2 VIEW COMPARISON:  01/26/2020 FINDINGS: Frontal and lateral views of the chest demonstrates stable postsurgical changes from bypass surgery. Cardiac silhouette is unremarkable. No airspace disease, effusion, or pneumothorax. No acute bony abnormalities. IMPRESSION: 1. No acute intrathoracic process. Electronically Signed   By: Sharlet Salina M.D.   On: 02/29/2020 20:43    EKG: Independently reviewed.  Assessment/Plan Principal Problem:   NSTEMI (non-ST elevated myocardial infarction) (HCC) Active Problems:   Hyperlipidemia LDL goal <70   Coronary artery disease involving  coronary bypass graft   Essential hypertension    1. NSTEMI vs Unstable angina - in pt with extensive CAD history, stent in Nov, positive trops, and symptoms c/w prior MIs.  Sounds like symptoms have crescendoed over past few days.  Symptoms now constant even at rest. 1. ACS pathway 2. SBP 170s so have room to work with: 1. Resume home metoprolol, giving dose now 2. NTG GTT being titrated up 3. Heparin gtt 4. Spoke with cardiology, they will consult, transfer to [facility] when able 5. Pt going over to [facility] when a bed opens up but no beds currently available 6. Therefore admitting to SDU here while awaiting bed at [facility] to open up. 7. Have informed flow manager / bed placement / carelink of need to transfer pt when bed becomes available. 8. ASA 9. Cont plavix 10. Tele monitor 11. NPO after MN 2. HTN - 1. Cont metoprolol 2. Holding imdur 3. On NTG GTT 3. HLD - 1. Cont statin and vascepa 2. Looks like she just took her Praluent yesterday so not due for another 12 days now.  DVT prophylaxis: Heparin gtt Code Status: Full Family Communication: No family in room, pt updated with the plan of care including intent to transfer to [facility], likely will end up needing LHC I suspect. Disposition Plan: Home after management of ACS Consults called: Spoke with Dr. Jerolyn Center with cardiology Admission status: Admit to inpatient  Severity of Illness: The appropriate patient status for this patient is INPATIENT. Inpatient status is judged to be reasonable and necessary in order to provide the required intensity of service to ensure the patient's safety. The patient's presenting symptoms, physical exam findings, and initial radiographic and laboratory data  in the context of their chronic comorbidities is felt to place them at high risk for further clinical deterioration. Furthermore, it is not anticipated that the patient will be medically stable for discharge from the hospital within 2 midnights of admission. The  following factors support the patient status of inpatient.   IP status for ACS with positive troponins.  Likely needs LHC I suspect.   * I certify that at the point of admission it is my clinical judgment that the patient will require inpatient hospital care spanning beyond 2 midnights from the point of admission due to high intensity of service, high risk for further deterioration and high frequency of surveillance required.*    Brenetta Penny M. DO [facility]  How to contact the [facility] Attending or Consulting provider 7A - 7P or covering provider during after hours 7P -7A, for this patient?  1. Check the care team in [facility] and look for a) attending/consulting TR[facility] provider listed and b) the [facility] team listed 2. Log into [email or web address]  Amion Physician Scheduling and messaging for groups and whole hospitals  On call and physician scheduling software for group practices, residents, hospitalists and other medical providers for call, clinic, rotation and shift schedules. On[employer] is a hospital-wide system for scheduling doctors and paging doctors on call. EasyPlot is for scientific plotting and data analysis.  [email or web address]  and use [place]'s universal password to access. If you do not have the password, please contact the hospital operator.  3. Locate the [facility] provider you are looking for under [facility] and page to a number that you can be directly reached. 4. If you still have difficulty reaching the provider, please page the [facility] (Director on Call) for the Hospitalists listed on amion for assistance.  02/29/2020, 11:47 PM

## 2020-02-29 NOTE — ED Provider Notes (Addendum)
[facility] DEPT Provider Note   CSN: [id number] Arrival date & time: 02/29/20  1924     History Chief Complaint  Patient presents with  . Abdominal Pain  . Chest Pain    [name] is a 59 y.o. female.  59 year old female with history of CAD, bipolar disorder, CHF presents with several month history of abdominal discomfort which radiates to her chest.  States that symptoms are worse after she eats.  Has had nonbilious emesis.  Symptoms wax and wane.  States that over the past several days the symptoms go into her jaw and arm.  Is concerned that this may be due to having an MI.  Diagnosed recently with gastroenteritis 3 days ago by her physician.  Denies any fever or chills.  No cough or congestion.  No new treatments used prior to arrival.        Past Medical History:  Diagnosis Date  . Anemia    takes iron supplement  . Arthritis    knees, shoulders  . Bipolar 1 disorder ([place])   . Cataract, immature   . CHF (congestive heart failure) ([place])   . COPD (chronic obstructive pulmonary disease) ([place])   . Coronary artery disease    triple vessel  . Family history of adverse reaction to anesthesia    states father "did not do well" with anesthesia; died last time he had surgery; also had heart disease  . Gallstones   . GERD (gastroesophageal reflux disease)    occasional - no current med.  . Glaucoma   . Hammertoe 05/2015   right second toe  . Hepatic steatosis   . High cholesterol   . History of MI (myocardial infarction)   . Hypertension    states under control with meds., has been on med. x 8 yr.  . Panic attacks   . Peripheral vascular disease ([place])    carotid  . Renal cyst, left   . Schizophrenia ([place])   . Short of breath on exertion   . Sleep apnea    no CPAP use  . Urinary, incontinence, stress female   . Wears dentures    upper  . Wears partial dentures    lower    Patient Active Problem List   Diagnosis Date Noted   . CAD- S/P PCI 07/03/2019  . Hx of CABG 07/03/2019  . Cardiac chest pain   . ACS (acute coronary syndrome) ([place]) 06/20/2019  . NSTEMI (non-ST elevated myocardial infarction) ([place])   . Unstable angina ([place]) 02/15/2016  . Essential hypertension 03/17/2015  . Hyperlipidemia LDL goal <70 12/24/2013  . Coronary artery disease involving coronary bypass graft 12/24/2013  . Expected blood loss anemia 10/22/2012  . Obesity 10/22/2012  . S/P right TKA 10/21/2012    Past Surgical History:  Procedure Laterality Date  . C-SECTIONS X 2  1985, 1988  . CARDIAC CATHETERIZATION  12/10/2006; 12/18/2006; 07/10/2007; 07/19/2009  . CARDIAC CATHETERIZATION  03/04/2010   patent grafts, normal LV function, diffusely diseased RCA (Dr. Adora Fridge)  . CARDIAC CATHETERIZATION N/A 02/15/2016   Procedure: Left Heart Cath and Cors/Grafts Angiography;  Surgeon: Peter M Martinique, MD;  Location: [facility];  Service: Cardiovascular;  Laterality: N/A;  . CLUB FOOT RELEASE Bilateral    1st and 2nd toes  . CORONARY ARTERY BYPASS GRAFT  07/16/2007   LIMA to LAD, VG to ramus, VG to PDA   . CORONARY STENT INTERVENTION N/A 06/23/2019   Procedure: CORONARY STENT INTERVENTION;  Surgeon: Lorretta Harp, MD;  Location: [facility];  Service: Cardiovascular;  Laterality: N/A;  . FLEXIBLE BRONCHOSCOPY  07/16/2007  . HAMMERTOE RECONSTRUCTION WITH WEIL OSTEOTOMY Right 06/03/2015   Procedure: RIGHT SECOND HAMMERTOE CORRECTION WITH MT WEIL OSTEOTOMY;  Surgeon: Wylene Simmer, MD;  Location: [facility];  Service: Orthopedics;  Laterality: Right;  . HAND SURGERY Bilateral    1st and 2nd fingers - release of clubbing  . KNEE ARTHROSCOPY WITH ANTERIOR CRUCIATE LIGAMENT (ACL) REPAIR Right 01/30/2008  . LEFT HEART CATH AND CORS/GRAFTS ANGIOGRAPHY N/A 06/23/2019   Procedure: LEFT HEART CATH AND CORS/GRAFTS ANGIOGRAPHY;  Surgeon: Lorretta Harp, MD;  Location: [facility];  Service: Cardiovascular;   Laterality: N/A;  . NOVASURE ABLATION  12/19/2007   with removal of IUD  . ORIF FOOT FRACTURE Left   . TOE SURGERIES TO REMOVE NAILS DUE TO CLUBBING    . TOTAL KNEE ARTHROPLASTY Right 10/21/2012   Procedure: TOTAL KNEE ARTHROPLASTY;  Surgeon: Mauri Pole, MD;  Location: [facility];  Service: Orthopedics;  Laterality: Right;     OB History   No obstetric history on file.     Family History  Problem Relation Age of Onset  . Heart disease Father   . Anesthesia problems Father        states "did not do well" with anesthesia  . Lung cancer Mother     Social History   Tobacco Use  . Smoking status: Former Smoker    Packs/day: 0.00    Years: 0.00    Pack years: 0.00    Quit date: 05/21/2015    Years since quitting: 4.7  . Smokeless tobacco: Never Used  . Tobacco comment: pt states she quit a week ago 06/14/19  Substance Use Topics  . Alcohol use: No  . Drug use: No    Home Medications Prior to Admission medications   Medication Sig Start Date End Date Taking? Authorizing Provider  albuterol (PROVENTIL HFA;VENTOLIN HFA) 108 (90 BASE) MCG/ACT inhaler Inhale 2 puffs into the lungs every 6 (six) hours as needed for wheezing or shortness of breath.     [provider]  Alirocumab (PRALUENT) 150 MG/ML SOAJ Inject 1 pen into the skin every 14 (fourteen) days. 09/22/19   Lorretta Harp, MD  aspirin 81 MG chewable tablet Chew 81 mg by mouth daily.    [provider]  atorvastatin (LIPITOR) 80 MG tablet Take 1 tablet (80 mg total) by mouth daily. Need to make an appt for overdue visit 06/30/19   Belva Crome, MD  BREO ELLIPTA 100-25 MCG/INH AEPB Inhale 1 puff into the lungs daily.  07/29/19   [provider]  brimonidine (ALPHAGAN) 0.15 % ophthalmic solution Place 1 drop into both eyes 2 (two) times daily.     [provider]  clopidogrel (PLAVIX) 75 MG tablet Take 1 tablet (75 mg total) by mouth daily with breakfast. 06/26/19   Furth, Cadence H, PA-C    escitalopram (LEXAPRO) 10 MG tablet Take 10 mg by mouth daily. 05/31/19   [provider]  fluticasone (FLONASE) 50 MCG/ACT nasal spray Place 1 spray into both nostrils daily. 06/13/19   [provider]  isosorbide mononitrate (IMDUR) 30 MG 24 hr tablet Take 30 mg by mouth daily.    [provider]  lamoTRIgine (LAMICTAL) 150 MG tablet Take 150 mg by mouth 2 (two) times daily. 08/05/19   [provider]  metoprolol tartrate (LOPRESSOR) 25 MG  tablet Take 0.5 tablets (12.5 mg total) by mouth 2 (two) times daily. 06/25/19   Furth, Cadence H, PA-C  niacin (NIASPAN) 1000 MG CR tablet TAKE 1 TABLET (1,000 MG TOTAL) BY MOUTH AT BEDTIME. Patient taking differently: Take 1,000 mg by mouth at bedtime.  02/07/19   Lorretta Harp, MD  nitroGLYCERIN (NITROSTAT) 0.4 MG SL tablet Place 1 tablet (0.4 mg total) under the tongue every 5 (five) minutes as needed for chest pain. 07/03/19   Erlene Quan, PA-C  QUEtiapine (SEROQUEL) 200 MG tablet Take 150 mg by mouth at bedtime.  05/31/19   [provider]  VASCEPA 1 g capsule TAKE 2 CAPSULES (2 G TOTAL) BY MOUTH 2 (TWO) TIMES DAILY. 11/26/19   Lorretta Harp, MD    Allergies    Benadryl [diphenhydramine hcl], Codeine, Haldol [haloperidol decanoate], Amoxicillin, and Bee venom  Review of Systems   Review of Systems  All other systems reviewed and are negative.   Physical Exam Updated Vital Signs BP (!) 170/108 (BP Location: Left Arm)   Pulse 71   Temp 98.3 F (36.8 C) (Oral)   Resp (!) 22   Ht 1.651 m (5\' 5" )   Wt 88 kg   LMP 11/16/2011   SpO2 98%   BMI 32.28 kg/m   Physical Exam Vitals and nursing note reviewed.  Constitutional:      General: She is not in acute distress.    Appearance: Normal appearance. She is well-developed. She is not toxic-appearing.  HENT:     Head: Normocephalic and atraumatic.  Eyes:     General: Lids are normal.     Conjunctiva/sclera: Conjunctivae normal.     Pupils:  Pupils are equal, round, and reactive to light.  Neck:     Thyroid: No thyroid mass.     Trachea: No tracheal deviation.  Cardiovascular:     Rate and Rhythm: Normal rate and regular rhythm.     Heart sounds: Normal heart sounds. No murmur heard.  No gallop.   Pulmonary:     Effort: Pulmonary effort is normal. No respiratory distress.     Breath sounds: Normal breath sounds. No stridor. No decreased breath sounds, wheezing, rhonchi or rales.  Abdominal:     General: Bowel sounds are normal. There is no distension.     Palpations: Abdomen is soft.     Tenderness: There is abdominal tenderness in the epigastric area. There is no rebound.    Musculoskeletal:        General: No tenderness. Normal range of motion.     Cervical back: Normal range of motion and neck supple.  Skin:    General: Skin is warm and dry.     Findings: No abrasion or rash.  Neurological:     Mental Status: She is alert and oriented to person, place, and time.     GCS: GCS eye subscore is 4. GCS verbal subscore is 5. GCS motor subscore is 6.     Cranial Nerves: No cranial nerve deficit.     Sensory: No sensory deficit.  Psychiatric:        Speech: Speech normal.        Behavior: Behavior normal.     ED Results / Procedures / Treatments   Labs (all labs ordered are listed, but only abnormal results are displayed) Labs Reviewed  CBC  COMPREHENSIVE METABOLIC PANEL  LIPASE, BLOOD  TROPONIN I (HIGH SENSITIVITY)    EKG EKG Interpretation  Date/Time:  Sunday February 29 2020 20:25:17 EDT Ventricular Rate:  67 PR Interval:    QRS Duration: 120 QT Interval:  445 QTC Calculation: 470 R Axis:   -18 Text Interpretation: Sinus rhythm Left ventricular hypertrophy Inferior infarct, old Anterolateral Q waves, probably due to LVH Lateral leads are also involved Baseline wander in lead(s) V3 V4 12 Lead; [place] Confirmed by Lacretia Leigh ([id number]) on 02/29/2020 8:29:43 PM   Radiology No results  found.  Procedures Procedures (including critical care time)  Medications Ordered in ED Medications  sodium chloride flush (NS) 0.9 % injection 3 mL (has no administration in time range)  metoCLOPramide (REGLAN) injection 10 mg (has no administration in time range)  sodium chloride 0.9 % bolus 500 mL (has no administration in time range)  0.9 %  sodium chloride infusion (has no administration in time range)    ED Course  I have reviewed the triage vital signs and the nursing notes.  Pertinent labs & imaging results that were available during my care of the patient were reviewed by me and considered in my medical decision making (see chart for details).    MDM Rules/Calculators/A&P                          Patient is EKG without acute ischemic changes.  Patient notes that her abdominal discomfort remains but that she has no jaw and arm pain which is what she has when she has her anginal equivalent.  Troponin is elevated here at 103.  Concern for NSTEMI and patient heparinized.  Nitro drip started.  Discussed with Dr. Rodman Key from cardiology who states he will consult per request medicine to admit.  Consult hospitalist for admission  CRITICAL CARE Performed by: Leota Jacobsen Total critical care time: 50 minutes Critical care time was exclusive of separately billable procedures and treating other patients. Critical care was necessary to treat or prevent imminent or life-threatening deterioration. Critical care was time spent personally by me on the following activities: development of treatment plan with patient and/or surrogate as well as nursing, discussions with consultants, evaluation of patient's response to treatment, examination of patient, obtaining history from patient or surrogate, ordering and performing treatments and interventions, ordering and review of laboratory studies, ordering and review of radiographic studies, pulse oximetry and re-evaluation of patient's  condition.   Final Clinical Impression(s) / ED Diagnoses Final diagnoses:  None    Rx / DC Orders ED Discharge Orders    None       Lacretia Leigh, MD 02/29/20 2242    Lacretia Leigh, MD 02/29/20 2242

## 2020-02-29 NOTE — ED Triage Notes (Signed)
Pt reports having abdominal pain with chest pressure that feels similar to prior heart attacks.

## 2020-03-01 ENCOUNTER — Inpatient Hospital Stay (HOSPITAL_COMMUNITY): Payer: Medicare Other

## 2020-03-01 ENCOUNTER — Inpatient Hospital Stay (HOSPITAL_COMMUNITY)
Admission: EM | Disposition: A | Discharge status: Home / Self Care | Payer: Self-pay | Source: Home / Self Care | Attending: Internal Medicine

## 2020-03-01 DIAGNOSIS — I2583 Coronary atherosclerosis due to lipid rich plaque: Secondary | ICD-10-CM

## 2020-03-01 DIAGNOSIS — E785 Hyperlipidemia, unspecified: Secondary | ICD-10-CM

## 2020-03-01 DIAGNOSIS — I10 Essential (primary) hypertension: Secondary | ICD-10-CM

## 2020-03-01 DIAGNOSIS — R079 Chest pain, unspecified: Secondary | ICD-10-CM

## 2020-03-01 DIAGNOSIS — I2510 Atherosclerotic heart disease of native coronary artery without angina pectoris: Secondary | ICD-10-CM

## 2020-03-01 DIAGNOSIS — I25810 Atherosclerosis of coronary artery bypass graft(s) without angina pectoris: Secondary | ICD-10-CM

## 2020-03-01 DIAGNOSIS — I214 Non-ST elevation (NSTEMI) myocardial infarction: Principal | ICD-10-CM

## 2020-03-01 HISTORY — PX: LEFT HEART CATH AND CORS/GRAFTS ANGIOGRAPHY: CATH118250

## 2020-03-01 LAB — BASIC METABOLIC PANEL
Anion gap: 11 (ref 5–15)
BUN: 18 mg/dL (ref 6–20)
CO2: 25 mmol/L (ref 22–32)
Calcium: 8.1 mg/dL — ABNORMAL LOW (ref 8.9–10.3)
Chloride: 100 mmol/L (ref 98–111)
Creatinine, Ser: 1.04 mg/dL — ABNORMAL HIGH (ref 0.44–1.00)
GFR calc Af Amer: >60 mL/min (ref >60)
GFR calc non Af Amer: 59 mL/min — ABNORMAL LOW (ref >60)
Glucose, Bld: 168 mg/dL — ABNORMAL HIGH (ref 70–99)
Potassium: 3.1 mmol/L — ABNORMAL LOW (ref 3.5–5.1)
Sodium: 136 mmol/L (ref 135–145)

## 2020-03-01 LAB — POCT ACTIVATED CLOTTING TIME: Activated Clotting Time: 114 s

## 2020-03-01 LAB — CBC
HCT: 36.2 % (ref 36.0–46.0)
Hemoglobin: 12.1 g/dL (ref 12.0–15.0)
MCH: 31.8 pg (ref 26.0–34.0)
MCHC: 33.4 g/dL (ref 30.0–36.0)
MCV: 95 fL (ref 80.0–100.0)
Platelets: 282 10*3/uL (ref 150–400)
RBC: 3.81 MIL/uL — ABNORMAL LOW (ref 3.87–5.11)
RDW: 15 % (ref 11.5–15.5)
WBC: 12.1 10*3/uL — ABNORMAL HIGH (ref 4.0–10.5)
nRBC: 0 % (ref 0.0–0.2)

## 2020-03-01 LAB — ECHOCARDIOGRAM COMPLETE
Height: 65 in
Weight: 3104 oz

## 2020-03-01 LAB — HEPARIN LEVEL (UNFRACTIONATED)
Heparin Unfractionated: 0.88 IU/mL — ABNORMAL HIGH (ref 0.30–0.70)
Heparin Unfractionated: 0.89 IU/mL — ABNORMAL HIGH (ref 0.30–0.70)

## 2020-03-01 LAB — MRSA PCR SCREENING: MRSA by PCR: NEGATIVE

## 2020-03-01 LAB — TROPONIN I (HIGH SENSITIVITY): Troponin I (High Sensitivity): 113 ng/L (ref <18)

## 2020-03-01 LAB — SARS CORONAVIRUS 2 BY RT PCR (HOSPITAL ORDER, PERFORMED IN ~~LOC~~ HOSPITAL LAB): SARS Coronavirus 2: NEGATIVE

## 2020-03-01 SURGERY — LEFT HEART CATH AND CORS/GRAFTS ANGIOGRAPHY
Anesthesia: LOCAL

## 2020-03-01 MED ORDER — OXYCODONE HCL 5 MG PO TABS: 5.0000 mg | ORAL_TABLET | ORAL | Status: DC | PRN

## 2020-03-01 MED ORDER — SODIUM CHLORIDE 0.9 % IV SOLN: INTRAVENOUS | Status: AC

## 2020-03-01 MED ORDER — CHLORHEXIDINE GLUCONATE CLOTH 2 % EX PADS
6.0000 | MEDICATED_PAD | Freq: Every day | CUTANEOUS | Status: DC
  Administered 2020-03-01: 6 via TOPICAL

## 2020-03-01 MED ORDER — LIDOCAINE HCL (PF) 1 % IJ SOLN
INTRAMUSCULAR | Status: AC
  Filled 2020-03-01: qty 30

## 2020-03-01 MED ORDER — HEPARIN (PORCINE) 25000 UT/250ML-% IV SOLN: 900.0000 [IU]/h | INTRAVENOUS | Status: DC

## 2020-03-01 MED ORDER — HEPARIN SODIUM (PORCINE) 5000 UNIT/ML IJ SOLN: 5000.0000 [IU] | Freq: Three times a day (TID) | INTRAMUSCULAR | Status: DC

## 2020-03-01 MED ORDER — IOHEXOL 350 MG/ML SOLN
INTRAVENOUS | Status: DC | PRN
  Administered 2020-03-01: 125 mL

## 2020-03-01 MED ORDER — MIDAZOLAM HCL 2 MG/2ML IJ SOLN
INTRAMUSCULAR | Status: DC | PRN
  Administered 2020-03-01 (×2): 1 mg via INTRAVENOUS

## 2020-03-01 MED ORDER — ISOSORBIDE MONONITRATE ER 30 MG PO TB24
30.0000 mg | ORAL_TABLET | Freq: Every day | ORAL | Status: DC
  Administered 2020-03-01 – 2020-03-02 (×2): 30 mg via ORAL
  Filled 2020-03-01 (×2): qty 1

## 2020-03-01 MED ORDER — IOHEXOL 350 MG/ML SOLN
INTRAVENOUS | Status: AC
  Filled 2020-03-01: qty 1

## 2020-03-01 MED ORDER — FENTANYL CITRATE (PF) 100 MCG/2ML IJ SOLN
INTRAMUSCULAR | Status: AC
  Filled 2020-03-01: qty 2

## 2020-03-01 MED ORDER — HYDRALAZINE HCL 20 MG/ML IJ SOLN: 10.0000 mg | INTRAMUSCULAR | Status: AC | PRN

## 2020-03-01 MED ORDER — POTASSIUM CHLORIDE CRYS ER 20 MEQ PO TBCR
40.0000 meq | EXTENDED_RELEASE_TABLET | Freq: Once | ORAL | Status: AC
  Administered 2020-03-01: 40 meq via ORAL
  Filled 2020-03-01: qty 2

## 2020-03-01 MED ORDER — POTASSIUM CHLORIDE 10 MEQ/100ML IV SOLN: 10.0000 meq | INTRAVENOUS | Status: DC

## 2020-03-01 MED ORDER — MIDAZOLAM HCL 2 MG/2ML IJ SOLN
INTRAMUSCULAR | Status: AC
  Filled 2020-03-01: qty 2

## 2020-03-01 MED ORDER — SODIUM CHLORIDE 0.9% FLUSH: 3.0000 mL | INTRAVENOUS | Status: DC | PRN

## 2020-03-01 MED ORDER — SODIUM CHLORIDE 0.9 % IV SOLN: 250.0000 mL | INTRAVENOUS | Status: DC | PRN

## 2020-03-01 MED ORDER — LABETALOL HCL 5 MG/ML IV SOLN: 10.0000 mg | INTRAVENOUS | Status: AC | PRN

## 2020-03-01 MED ORDER — ASPIRIN 81 MG PO CHEW
81.0000 mg | CHEWABLE_TABLET | Freq: Every day | ORAL | Status: DC
  Administered 2020-03-02: 81 mg via ORAL
  Filled 2020-03-01: qty 1

## 2020-03-01 MED ORDER — ACETAMINOPHEN 325 MG PO TABS: 650.0000 mg | ORAL_TABLET | ORAL | Status: DC | PRN

## 2020-03-01 MED ORDER — SODIUM CHLORIDE 0.9 % WEIGHT BASED INFUSION: 3.0000 mL/kg/h | INTRAVENOUS | Status: DC

## 2020-03-01 MED ORDER — HEPARIN (PORCINE) IN NACL 1000-0.9 UT/500ML-% IV SOLN
INTRAVENOUS | Status: DC | PRN
  Administered 2020-03-01 (×2): 500 mL

## 2020-03-01 MED ORDER — HEPARIN (PORCINE) IN NACL 1000-0.9 UT/500ML-% IV SOLN
INTRAVENOUS | Status: AC
  Filled 2020-03-01: qty 1000

## 2020-03-01 MED ORDER — ONDANSETRON HCL 4 MG/2ML IJ SOLN
4.0000 mg | Freq: Four times a day (QID) | INTRAMUSCULAR | Status: DC | PRN
Start: 2020-03-01 — End: 2020-03-01

## 2020-03-01 MED ORDER — SODIUM CHLORIDE 0.9% FLUSH
3.0000 mL | Freq: Two times a day (BID) | INTRAVENOUS | Status: DC
  Administered 2020-03-02: 3 mL via INTRAVENOUS

## 2020-03-01 MED ORDER — SODIUM CHLORIDE 0.9 % WEIGHT BASED INFUSION: 1.0000 mL/kg/h | INTRAVENOUS | Status: DC

## 2020-03-01 MED ORDER — FENTANYL CITRATE (PF) 100 MCG/2ML IJ SOLN
INTRAMUSCULAR | Status: DC | PRN
  Administered 2020-03-01: 12.5 ug via INTRAVENOUS
  Administered 2020-03-01: 25 ug via INTRAVENOUS

## 2020-03-01 MED ORDER — LIDOCAINE HCL (PF) 1 % IJ SOLN
INTRAMUSCULAR | Status: DC | PRN
  Administered 2020-03-01: 20 mL

## 2020-03-01 MED ORDER — HEPARIN SODIUM (PORCINE) 5000 UNIT/ML IJ SOLN
5000.0000 [IU] | Freq: Three times a day (TID) | INTRAMUSCULAR | Status: DC
  Administered 2020-03-02: 5000 [IU] via SUBCUTANEOUS
  Filled 2020-03-01: qty 1

## 2020-03-01 SURGICAL SUPPLY — 10 items
CATH INFINITI 5 FR IM (CATHETERS) ×1 IMPLANT
CATH INFINITI 5FR MPB2 (CATHETERS) ×1 IMPLANT
KIT HEART LEFT (KITS) ×2 IMPLANT
PACK CARDIAC CATHETERIZATION (CUSTOM PROCEDURE TRAY) ×2 IMPLANT
SHEATH PINNACLE 5F 10CM (SHEATH) ×1 IMPLANT
SHEATH PROBE COVER 6X72 (BAG) ×1 IMPLANT
TRANSDUCER W/STOPCOCK (MISCELLANEOUS) ×2 IMPLANT
TUBING CIL FLEX 10 FLL-RA (TUBING) ×2 IMPLANT
WIRE EMERALD 3MM-J .035X150CM (WIRE) ×1 IMPLANT
WIRE HI TORQ VERSACORE-J 145CM (WIRE) ×1 IMPLANT

## 2020-03-01 NOTE — Consult Note (Signed)
Cardiology Consultation:   Patient ID: [name] MRN: [id number]; DOB: 1960-12-10  Admit date: 02/29/2020 Date of Consult: 03/01/2020  Primary Care Provider: Katherina Mires, MD [facility] Cardiologist: Quay Burow, MD  [facility] Electrophysiologist:  None    Patient Profile:   [name] is a 59 y.o. female with a hx of CAD s/p CABG x 3, obesity, current smoker, HTN, HLD, OSA intolerant to CPAP, and COPD who is being seen today for the evaluation of chest pain and elevated troponin at the request of Dr. Cruzita Lederer.  History of Present Illness:   [name] has a complex cardiac history with prior stenting of the RCA by Dr. Doylene Canard. She underwent CABG x 3 in 2008 (LIMA-LAD, SVG-ramus, SVG-PDA). Heart cath 02/2010 with patent grafts and normal EF. Myoview 2012 was nonischemic. Heart cath 2017 with patent grafts and unchanged anatomy from 2011 angiography. Heart cath 06/2019 with high-grade distal SVG-PDA stenosis treated with DES. Residual SVG-RI with 50% narrowing and mSVG-PDA with 5% narrowing. EF was normal. Echo did show mild diastolic dysfunction. She was last seen in clinic with Dr. Gwenlyn Found 08/26/19 and was feeling well at that time. 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  She presented to [facility] 01/26/20 via EMS for worsening chest pain but left prior to being seen. Of note, hs troponin was 13 and EKG nonischemic. Pt became angry after being asked to wait in the lobby. She called our office and was advised to wait to be seen in the ER. Pt eventually left.   She presented back to [facility] 02/29/20 with abdominal discomfort for the past several months, worse after eating. Symptoms worsened over the past several days with pain now radiating to her chest, shoulders, jaw, and arms. Of note, she reports being diagnosed with gastritis 3 days ago. She was admitted and cardiology consulted.   HS troponin 103 --> 84 --> 113 EKG sinus rhythm, Q waves  inferior and anterolatearl leads (old), RBBB (old)  On my interview, she states she "knew it was her heart" and has been "complaining about this for weeks."  She denies gastritis. She states that she has been having abdominal pain for months and points to her epigastric area. Yesterday she ate food and developed bloating and increased pain in her epigastric area which then radiated to her chest, back, shoulders, jaws, and arms that was similar to her prior MI. Of note, CE were 24 --> 28 --> 13 in 05-2019 prior to her last PCI. She experienced SOB, diaphoresis, nausea and vomiting with the pain. She reports this abdomina/chest pain occurs daily with exertion and relieved with rest. She reports yesterday chest pain was rated as a 20/10 and is now a zero on nitro drip. She continues to smoke, but states she quit 7 days ago. She denies history of OSA. No recent syncope or edema.    Past Medical History:  Diagnosis Date  . Anemia    takes iron supplement  . Arthritis    knees, shoulders  . Bipolar 1 disorder ([place])   . Cataract, immature   . CHF (congestive heart failure) ([place])   . COPD (chronic obstructive pulmonary disease) ([place])   . Coronary artery disease    triple vessel  . Family history of adverse reaction to anesthesia    states father "did not do well" with anesthesia; died last time he had surgery; also had heart disease  . Gallstones   . GERD (gastroesophageal reflux  disease)    occasional - no current med.  . Glaucoma   . Hammertoe 05/2015   right second toe  . Hepatic steatosis   . High cholesterol   . History of MI (myocardial infarction)   . Hypertension    states under control with meds., has been on med. x 8 yr.  . Panic attacks   . Peripheral vascular disease ([place])    carotid  . Renal cyst, left   . Schizophrenia ([place])   . Short of breath on exertion   . Sleep apnea    no CPAP use  . Urinary, incontinence, stress female   . Wears dentures    upper  . Wears partial  dentures    lower    Past Surgical History:  Procedure Laterality Date  . C-SECTIONS X 2  1985, 1988  . CARDIAC CATHETERIZATION  12/10/2006; 12/18/2006; 07/10/2007; 07/19/2009  . CARDIAC CATHETERIZATION  03/04/2010   patent grafts, normal LV function, diffusely diseased RCA (Dr. Adora Fridge)  . CARDIAC CATHETERIZATION N/A 02/15/2016   Procedure: Left Heart Cath and Cors/Grafts Angiography;  Surgeon: Peter M Martinique, MD;  Location: [place] [facility];  Service: Cardiovascular;  Laterality: N/A;  . CLUB FOOT RELEASE Bilateral    1st and 2nd toes  . CORONARY ARTERY BYPASS GRAFT  07/16/2007   LIMA to LAD, VG to ramus, VG to PDA   . CORONARY STENT INTERVENTION N/A 06/23/2019   Procedure: CORONARY STENT INTERVENTION;  Surgeon: Lorretta Harp, MD;  Location: [place] [facility];  Service: Cardiovascular;  Laterality: N/A;  . FLEXIBLE BRONCHOSCOPY  07/16/2007  . HAMMERTOE RECONSTRUCTION WITH WEIL OSTEOTOMY Right 06/03/2015   Procedure: RIGHT SECOND HAMMERTOE CORRECTION WITH MT WEIL OSTEOTOMY;  Surgeon: Wylene Simmer, MD;  Location: [place];  Service: Orthopedics;  Laterality: Right;  . HAND SURGERY Bilateral    1st and 2nd fingers - release of clubbing  . KNEE ARTHROSCOPY WITH ANTERIOR CRUCIATE LIGAMENT (ACL) REPAIR Right 01/30/2008  . LEFT HEART CATH AND CORS/GRAFTS ANGIOGRAPHY N/A 06/23/2019   Procedure: LEFT HEART CATH AND CORS/GRAFTS ANGIOGRAPHY;  Surgeon: Lorretta Harp, MD;  Location: [place] [facility];  Service: Cardiovascular;  Laterality: N/A;  . NOVASURE ABLATION  12/19/2007   with removal of IUD  . ORIF FOOT FRACTURE Left   . TOE SURGERIES TO REMOVE NAILS DUE TO CLUBBING    . TOTAL KNEE ARTHROPLASTY Right 10/21/2012   Procedure: TOTAL KNEE ARTHROPLASTY;  Surgeon: Mauri Pole, MD;  Location: [facility];  Service: Orthopedics;  Laterality: Right;     Home Medications:  Prior to Admission medications   Medication Sig Start Date End Date Taking? Authorizing Provider   albuterol (PROVENTIL HFA;VENTOLIN HFA) 108 (90 BASE) MCG/ACT inhaler Inhale 2 puffs into the lungs every 6 (six) hours as needed for wheezing or shortness of breath.    Yes [provider]  Alirocumab (PRALUENT) 150 MG/ML SOAJ Inject 1 pen into the skin every 14 (fourteen) days. 09/22/19  Yes Lorretta Harp, MD  aspirin 81 MG chewable tablet Chew 81 mg by mouth daily.   Yes [provider]  atorvastatin (LIPITOR) 80 MG tablet Take 1 tablet (80 mg total) by mouth daily. Need to make an appt for overdue visit 06/30/19  Yes Belva Crome, MD  BREO ELLIPTA 100-25 MCG/INH AEPB Inhale 1 puff into the lungs daily.  07/29/19  Yes [provider]  brimonidine (ALPHAGAN) 0.15 % ophthalmic solution Place 1 drop into both eyes  2 (two) times daily.    Yes [provider]  cholecalciferol (VITAMIN D3) 25 MCG (1000 UNIT) tablet Take 1,000 Units by mouth daily.   Yes [provider]  clopidogrel (PLAVIX) 75 MG tablet Take 1 tablet (75 mg total) by mouth daily with breakfast. 06/26/19  Yes Furth, Cadence H, PA-C  escitalopram (LEXAPRO) 10 MG tablet Take 10 mg by mouth daily. 05/31/19  Yes [provider]  fluticasone (FLONASE) 50 MCG/ACT nasal spray Place 1 spray into both nostrils daily. 06/13/19  Yes [provider]  isosorbide mononitrate (IMDUR) 30 MG 24 hr tablet Take 30 mg by mouth daily.   Yes [provider]  lamoTRIgine (LAMICTAL) 150 MG tablet Take 150 mg by mouth daily.  08/05/19  Yes [provider]  metoprolol tartrate (LOPRESSOR) 25 MG tablet Take 0.5 tablets (12.5 mg total) by mouth 2 (two) times daily. 06/25/19  Yes Furth, Cadence H, PA-C  niacin (NIASPAN) 1000 MG CR tablet TAKE 1 TABLET (1,000 MG TOTAL) BY MOUTH AT BEDTIME. Patient taking differently: Take 1,000 mg by mouth at bedtime.  02/07/19  Yes Lorretta Harp, MD  nitroGLYCERIN (NITROSTAT) 0.4 MG SL tablet Place 1 tablet (0.4 mg total) under the tongue every 5  (five) minutes as needed for chest pain. 07/03/19  Yes Kilroy, Luke K, PA-C  ondansetron (ZOFRAN-ODT) 8 MG disintegrating tablet Take 8 mg by mouth every 8 (eight) hours as needed for nausea or vomiting.  02/26/20  Yes [provider]  QUEtiapine (SEROQUEL) 200 MG tablet Take 125 mg by mouth at bedtime. Takes 1 1/4 tablet (125 mg) 05/31/19  Yes [provider]  VASCEPA 1 g capsule TAKE 2 CAPSULES (2 G TOTAL) BY MOUTH 2 (TWO) TIMES DAILY. 11/26/19  Yes Lorretta Harp, MD  vitamin B-12 (CYANOCOBALAMIN) 1000 MCG tablet Take 1,000 mcg by mouth daily.   Yes [provider]    Inpatient Medications: Scheduled Meds: . aspirin EC  81 mg Oral Daily  . atorvastatin  80 mg Oral Daily  . brimonidine  1 drop Both Eyes BID  . Chlorhexidine Gluconate Cloth  6 each Topical Daily  . cholecalciferol  1,000 Units Oral Daily  . clopidogrel  75 mg Oral Q breakfast  . escitalopram  10 mg Oral Daily  . fluticasone  1 spray Each Nare Daily  . fluticasone furoate-vilanterol  1 puff Inhalation Daily  . icosapent Ethyl  2 g Oral BID  . lamoTRIgine  150 mg Oral Daily  . metoprolol tartrate  12.5 mg Oral BID  . QUEtiapine  125 mg Oral QHS   Continuous Infusions: . heparin 900 Units/hr (03/01/20 0717)  . nitroGLYCERIN 40 mcg/min (03/01/20 0831)  . potassium chloride     PRN Meds: acetaminophen, albuterol, nitroGLYCERIN, ondansetron (ZOFRAN) IV  Allergies:    Allergies  Allergen Reactions  . Benadryl [Diphenhydramine Hcl] Shortness Of Breath and Swelling    Tongue swelling  . Codeine Other (See Comments)    "makes me psychotic"   . Haldol [Haloperidol Decanoate] Anaphylaxis  . Amoxicillin Hives and Swelling    Swelling to extremities Did it involve swelling of the face/tongue/throat, SOB, or low BP? Yes Did it involve sudden or severe rash/hives, skin peeling, or any reaction on the inside of your mouth or nose? Yes Did you need to seek medical attention at a hospital or  doctor's office? Yes When did it last happen?young adult If all above answers are "NO", may proceed with cephalosporin use.  Marland Kitchen Bee  Venom Swelling and Other (See Comments)    "pimples" from head to toe and whole body swelling    Social History:   Social History   Socioeconomic History  . Marital status: Divorced    Spouse name: Not on file  . Number of children: Not on file  . Years of education: Not on file  . Highest education level: Not on file  Occupational History  . Not on file  Tobacco Use  . Smoking status: Former Smoker    Packs/day: 0.00    Years: 0.00    Pack years: 0.00    Quit date: 05/21/2015    Years since quitting: 4.7  . Smokeless tobacco: Never Used  . Tobacco comment: pt states she quit a week ago 06/14/19  Substance and Sexual Activity  . Alcohol use: No  . Drug use: No  . Sexual activity: Not on file  Other Topics Concern  . Not on file  Social History Narrative  . Not on file   Social Determinants of Health   Financial Resource Strain:   . Difficulty of Paying Living Expenses:   Food Insecurity:   . Worried About Charity fundraiser in the Last Year:   . Arboriculturist in the Last Year:   Transportation Needs:   . Film/video editor (Medical):   Marland Kitchen Lack of Transportation (Non-Medical):   Physical Activity:   . Days of Exercise per Week:   . Minutes of Exercise per Session:   Stress:   . Feeling of Stress :   Social Connections:   . Frequency of Communication with Friends and Family:   . Frequency of Social Gatherings with Friends and Family:   . Attends Religious Services:   . Active Member of Clubs or Organizations:   . Attends Archivist Meetings:   Marland Kitchen Marital Status:   Intimate Partner Violence:   . Fear of Current or Ex-Partner:   . Emotionally Abused:   Marland Kitchen Physically Abused:   . Sexually Abused:     Family History:    Family History  Problem Relation Age of Onset  . Heart disease Father   . Anesthesia  problems Father        states "did not do well" with anesthesia  . Lung cancer Mother      ROS:  Please see the history of present illness.   All other ROS reviewed and negative.     Physical Exam/Data:   Vitals:   03/01/20 0800 03/01/20 0815 03/01/20 0830 03/01/20 0834  BP: (!) 165/103 (!) 171/80 (!) 177/92   Pulse: 61 (!) 56 (!) 57   Resp: (!) 24 12 (!) 21   Temp: 98.5 F (36.9 C)     TempSrc: Oral     SpO2: 96% 94% 95% 97%  Weight:      Height:        Intake/Output Summary (Last 24 hours) at 03/01/2020 1018 Last data filed at 03/01/2020 0831 Gross per 24 hour  Intake 893.29 ml  Output -  Net 893.29 ml   Last 3 Weights 02/29/2020 01/26/2020 08/26/2019  Weight (lbs) 194 lb 198 lb 6.6 oz 198 lb  Weight (kg) 87.998 kg 90 kg 89.812 kg     Body mass index is 32.28 kg/m.  General:  Well nourished, well developed, in no acute distress HEENT: normal Lymph: no adenopathy Neck: no JVD Endocrine:  No thryomegaly Vascular: No carotid bruits; FA pulses 2+ bilaterally without bruits  Cardiac:  normal S1, S2; RRR; soft systolic murmur Lungs:  clear to auscultation bilaterally, no wheezing, rhonchi or rales  Abd: soft, nontender, no hepatomegaly  Ext: no edema Musculoskeletal:  No deformities, BUE and BLE strength normal and equal Skin: warm and dry  Neuro:  CNs 2-12 intact, no focal abnormalities noted Psych:  Normal affect   EKG:  The EKG was personally reviewed and demonstrates:   sinus rhythm HR 65, Q waves inferior and anterolatearl leads (old), RBBB (old) Telemetry:  Telemetry was personally reviewed and demonstrates:  Sinus rhythm in the 60s, can dip into the 50s when sleeping  Relevant CV Studies:  Echo 06/20/19: 1. Left ventricular ejection fraction, by visual estimation, is 60 to  65%. The left ventricle has normal function. There is mildly increased  left ventricular hypertrophy.  2. Elevated left ventricular end-diastolic pressure.  3. Left ventricular  diastolic parameters are consistent with Grade I  diastolic dysfunction (impaired relaxation).  4. Global right ventricle has normal systolic function.The right  ventricular size is normal. No increase in right ventricular wall  thickness.  5. Left atrial size was normal.  6. Right atrial size was normal.  7. The mitral valve is normal in structure. Mild mitral valve  regurgitation. No evidence of mitral stenosis.  8. The tricuspid valve is normal in structure. Tricuspid valve  regurgitation is trivial.  9. The aortic valve is normal in structure. Aortic valve regurgitation is  not visualized. No evidence of aortic valve sclerosis or stenosis.  10. The pulmonic valve was normal in structure. Pulmonic valve  regurgitation is not visualized.  11. The inferior vena cava is normal in size with greater than 50%  respiratory variability, suggesting right atrial pressure of 3 mmHg.    Left heart cath 06/23/19:  Prox Graft to Mid Graft lesion is 50% stenosed.  Dist Graft lesion is 95% stenosed.  Prox LAD to Mid LAD lesion is 100% stenosed.  Prox RCA lesion is 90% stenosed.  Mid RCA lesion is 90% stenosed.  Dist RCA lesion is 100% stenosed.  Origin to Mid Graft lesion is 50% stenosed.  A drug-eluting stent was successfully placed.  Post intervention, there is a 0% residual stenosis.   IMPRESSION: Successful PDA SVG PCI drug-eluting stenting deploying 3 drug-eluting stents (3.5 x 20 Synergy, 3.5 x 26 and 22 resolute Onyx) with ultimate resolution of her no reflow and covering of the diseased segment as well as the thrombotic lesions both proximal and distal to the original stent.  It is unclear what caused the thrombotic lesions.  She was having some chest pain at the end of the procedure which I suspect will gradually improve.  EKG showed no acute changes.  The Angiomax was turned off and the Aggrastat continues for 18 hours.  She will need dual antiplatelet therapy uninterrupted  for 12 months per guideline directed medical therapy    Laboratory Data:  High Sensitivity Troponin:   Recent Labs  Lab 02/29/20 2025 02/29/20 2225 03/01/20 0131  TROPONINIHS 103* 84* 113*     Chemistry Recent Labs  Lab 02/29/20 2042 03/01/20 0131  NA 138 136  K 3.4* 3.1*  CL 100 100  CO2 23 25  GLUCOSE 144* 168*  BUN 23* 18  CREATININE 1.13* 1.04*  CALCIUM 9.3 8.1*  GFRNONAA 54* 59*  GFRAA >60 >60  ANIONGAP 15 11    Recent Labs  Lab 02/29/20 2042  PROT 7.9  ALBUMIN 4.2  AST 21  ALT 20  ALKPHOS 117  BILITOT 1.0   Hematology Recent Labs  Lab 02/29/20 2025 03/01/20 0131  WBC 16.6* 12.1*  RBC 4.36 3.81*  HGB 13.9 12.1  HCT 40.9 36.2  MCV 93.8 95.0  MCH 31.9 31.8  MCHC 34.0 33.4  RDW 14.7 15.0  PLT 301 282   BNPNo results for input(s): BNP, PROBNP in the last 168 hours.  DDimer No results for input(s): DDIMER in the last 168 hours.   Radiology/Studies:  DG Chest 2 View  Result Date: 02/29/2020 CLINICAL DATA:  Chest pressure, abdominal pain EXAM: CHEST - 2 VIEW COMPARISON:  01/26/2020 FINDINGS: Frontal and lateral views of the chest demonstrates stable postsurgical changes from bypass surgery. Cardiac silhouette is unremarkable. No airspace disease, effusion, or pneumothorax. No acute bony abnormalities. IMPRESSION: 1. No acute intrathoracic process. Electronically Signed   By: Randa Ngo M.D.   On: 02/29/2020 20:43       TIMI Risk Score for Unstable Angina or Non-ST Elevation MI:   The patient's TIMI risk score is 5, which indicates a 26% risk of all cause mortality, new or recurrent myocardial infarction or need for urgent revascularization in the next 14 days.      Assessment and Plan:   NSTEMI CAD s/p CABG x 3 - history of multiple PCIs - hs troponin 103 --> 84 --> 113 - pt describes both typical and atypical features of chest pain - given her known disease and elevated CE, would opt for definitive angiography - continue heparin and  nitro gtt's - continue ASA, plavix, and BB - keep NPO   Hyperlipidemia 06/21/2019: VLDL 37 10/22/2019: Cholesterol, Total 117; HDL 55; LDL Chol Calc (NIH) 41; Triglycerides 117 - continue statin, vascepa, and praluent   Hypertension - imdur on hold for nitro gtt - continue BB   Hypokalemia - K 3.1 - replace per primary   COPD Current smoker - states she quit smoking 7 days ago - previously reported cessation   Bipolar disorder - continue home medications     For questions or updates, please contact [facility] Please consult [email or web address] for contact info under    Signed, Ledora Bottcher, PA  03/01/2020 10:18 AM

## 2020-03-01 NOTE — Plan of Care (Signed)

## 2020-03-01 NOTE — Progress Notes (Signed)
PHARMACY - HEPARIN (brief note)  IV heparin gtt infusing @ 1050 units/hr (IV heparin started @ 22:53 on 7/11).  Heparin level was ordered to be drawn 6 hr after heparin started; however heparin level drawn @ 01:31 = 0.88.  Appears supratherapeutic however level was drawn approx 3 hours after bolus and infusion started.  Continue heparin @ current rate for now.  Will recheck heparin level @ 0500 as previously ordered.  Leone Haven, PharmD

## 2020-03-01 NOTE — CV Procedure (Signed)
   Left heart catheterization, coronary angiography, bypass graft angiography, and left ventriculography via right femoral approach using real-time vascular ultrasound for arterial access.  Distally occluded native RCA.  Proximal and mid vessel is severely and diffusely diseased.  Total occlusion of the mid LAD  Diffuse disease in the proximal and mid circumflex.  There is proximal to mid eccentric 60% narrowing unchanged from prior.  There is ostial 60% narrowing.  Totally occluded ramus intermedius branch.  The saphenous vein graft to the PDA is totally occluded in the mid body.  This appears to be a relatively recent occlusion.  The left circumflex, bypass graft to the ramus, and LAD supplied collaterals to the distal right coronary territory.  Patent saphenous vein graft to the ramus intermedius with diffuse proximal to mid atherosclerosis but no significant obstructive disease.  Left ventriculography by hand-injection using a B2 multipurpose catheter demonstrated ejection fraction 45 to 50% with inferobasal to mid inferior wall akinesis.  LVEDP 15 mmHg.  Start isosorbide mononitrate to improve collateral perfusion of the distal right coronary territory.  IV nitroglycerin was discontinued.  Subcutaneous heparin is recommended.  Beta-blocker therapy as tolerated to help suppress angina.  Aggressive risk factor modification.  Smoking cessation is key.  Most recent LDL was less than 55.

## 2020-03-01 NOTE — Progress Notes (Signed)
  Echocardiogram 2D Echocardiogram has been performed.  [name] 03/01/2020, 5:54 PM

## 2020-03-01 NOTE — Progress Notes (Signed)
PROGRESS NOTE  [name] SFK:[id number] DOB: [birth year] DOA: 02/29/2020 PCP: Katherina Mires, MD   LOS: 1 day   Brief Narrative / Interim history: 59 year old female with history of triple-vessel CAD, prior MI, CABG x3 in the past status post multiple stents.  Most recently had a cardiac cath in November 2020 status post DES to graft to RPDA.  She has been on aspirin and Plavix at home and reports compliance except for the last 1 to 2 days due to feeling sick.  She presents to the hospital with worsening chest pain radiating into her left jaw and left shoulder consistent with prior MI symptoms.  She was placed on nitroglycerin infusion and heparin in the ED and cardiology was consulted.  Subjective / 24h Interval events: She is feeling much better this morning, currently denies any chest pain.  No lightheadedness, no dizziness.  Assessment & Plan: Principal Problem Non-STEMI-she has an extensive CAD history, recent stenting November.  Reports compliance with aspirin and Plavix at home.  Symptoms have gotten worse over the last few days which prompted her to come to the ER.  Patient is supposed to be going to [facility] today but was temporarily admitted to [facility] stepdown due to lack of bed availability.  Cardiology consulted and will evaluate patient.  Continue aspirin, statin, Plavix as well as metoprolol  Active Problems Essential hypertension-continue metoprolol, hold Imdur since she is on nitro drip  Hyperlipidemia-continue statin and Vascepa.  Tobacco use-stopped smoking 7 days ago.  Encouraged continued cessation  Bipolar disorder-stable on medications, continue Seroquel and Lamictal  Scheduled Meds: . aspirin EC  81 mg Oral Daily  . atorvastatin  80 mg Oral Daily  . brimonidine  1 drop Both Eyes BID  . Chlorhexidine Gluconate Cloth  6 each Topical Daily  . cholecalciferol  1,000 Units Oral Daily  . clopidogrel  75 mg Oral Q breakfast  . escitalopram  10 mg Oral Daily    . fluticasone  1 spray Each Nare Daily  . fluticasone furoate-vilanterol  1 puff Inhalation Daily  . icosapent Ethyl  2 g Oral BID  . lamoTRIgine  150 mg Oral Daily  . metoprolol tartrate  12.5 mg Oral BID  . QUEtiapine  125 mg Oral QHS   Continuous Infusions: . heparin 900 Units/hr (03/01/20 0717)  . nitroGLYCERIN 40 mcg/min (03/01/20 0831)  . potassium chloride     PRN Meds:.acetaminophen, albuterol, nitroGLYCERIN, ondansetron (ZOFRAN) IV  Diet Orders (From admission, onward)    Start     Ordered   02/29/20 2336  Diet NPO time specified Except for: Sips with Meds  Diet effective now       Question:  Except for  Answer:  Ferrel Logan with Meds   02/29/20 2341          DVT prophylaxis:      Code Status: Full Code  Family Communication: no family at bedside   Status is: Inpatient  Remains inpatient appropriate because:Inpatient level of care appropriate due to severity of illness   Dispo: The patient is from: Home              Anticipated d/c is to: Home              Anticipated d/c date is: 2 days              Patient currently is not medically stable to d/c.  Consultants:  Cardiology   Procedures:  None   Microbiology  None  Antimicrobials: None     Objective: Vitals:   03/01/20 0800 03/01/20 0815 03/01/20 0830 03/01/20 0834  BP: (!) 165/103 (!) 171/80 (!) 177/92   Pulse: 61 (!) 56 (!) 57   Resp: (!) 24 12 (!) 21   Temp: 98.5 F (36.9 C)     TempSrc: Oral     SpO2: 96% 94% 95% 97%  Weight:      Height:        Intake/Output Summary (Last 24 hours) at 03/01/2020 0943 Last data filed at 03/01/2020 0831 Gross per 24 hour  Intake 893.29 ml  Output --  Net 893.29 ml   Filed Weights   02/29/20 1956  Weight: 88 kg    Examination: Constitutional: NAD Eyes: no scleral icterus ENMT: Mucous membranes are moist.  Neck: normal, supple Respiratory: clear to auscultation bilaterally, no wheezing, no crackles.  Cardiovascular: Regular rate and rhythm, no  murmurs / rubs / gallops.  Abdomen: non distended, no tenderness. Bowel sounds positive.  Musculoskeletal: no clubbing / cyanosis.  Skin: no rashes Neurologic: non focal   Data Reviewed: I have independently reviewed following labs and imaging studies   CBC: Recent Labs  Lab 02/29/20 2025 03/01/20 0131  WBC 16.6* 12.1*  HGB 13.9 12.1  HCT 40.9 36.2  MCV 93.8 95.0  PLT 301 086   Basic Metabolic Panel: Recent Labs  Lab 02/29/20 2042 03/01/20 0131  NA 138 136  K 3.4* 3.1*  CL 100 100  CO2 23 25  GLUCOSE 144* 168*  BUN 23* 18  CREATININE 1.13* 1.04*  CALCIUM 9.3 8.1*   Liver Function Tests: Recent Labs  Lab 02/29/20 2042  AST 21  ALT 20  ALKPHOS 117  BILITOT 1.0  PROT 7.9  ALBUMIN 4.2   Coagulation Profile: Recent Labs  Lab 02/29/20 2225  INR 1.0   HbA1C: No results for input(s): HGBA1C in the last 72 hours. CBG: No results for input(s): GLUCAP in the last 168 hours.  Recent Results (from the past 240 hour(s))  SARS Coronavirus 2 by RT PCR (hospital order, performed in [facility] hospital lab) Nasopharyngeal Nasopharyngeal Swab     Status: None   Collection Time: 02/29/20 11:13 PM   Specimen: Nasopharyngeal Swab  Result Value Ref Range Status   SARS Coronavirus 2 NEGATIVE NEGATIVE Final    Comment: (NOTE) SARS-CoV-2 target nucleic acids are NOT DETECTED.  The SARS-CoV-2 RNA is generally detectable in upper and lower respiratory specimens during the acute phase of infection. The lowest concentration of SARS-CoV-2 viral copies this assay can detect is 250 copies / mL. A negative result does not preclude SARS-CoV-2 infection and should not be used as the sole basis for treatment or other patient management decisions.  A negative result may occur with improper specimen collection / handling, submission of specimen other than nasopharyngeal swab, presence of viral mutation(s) within the areas targeted by this assay, and inadequate number of viral  copies (<250 copies / mL). A negative result must be combined with clinical observations, patient history, and epidemiological information.  Fact Sheet for Patients:   StrictlyIdeas.no  Fact Sheet for Healthcare Providers: BankingDealers.co.za  This test is not yet approved or  cleared by the [place] FDA and has been authorized for detection and/or diagnosis of SARS-CoV-2 by FDA under an Emergency Use Authorization (EUA).  This EUA will remain in effect (meaning this test can be used) for the duration of the COVID-19 declaration under Section 564(b)(1) of the Act, 21 U.S.C. section 360bbb-3(b)(1),  unless the authorization is terminated or revoked sooner.  Performed at [facility], [place] [address], [place], [place] [address]   MRSA PCR Screening     Status: None   Collection Time: 03/01/20  1:13 AM   Specimen: Nasal Mucosa; Nasopharyngeal  Result Value Ref Range Status   MRSA by PCR NEGATIVE NEGATIVE Final    Comment:        The GeneXpert MRSA Assay (FDA approved for NASAL specimens only), is one component of a comprehensive MRSA colonization surveillance program. It is not intended to diagnose MRSA infection nor to guide or monitor treatment for MRSA infections. Performed at [facility], [place] [address], [place],  [address]      Radiology Studies: DG Chest 2 View  Result Date: 02/29/2020 CLINICAL DATA:  Chest pressure, abdominal pain EXAM: CHEST - 2 VIEW COMPARISON:  01/26/2020 FINDINGS: Frontal and lateral views of the chest demonstrates stable postsurgical changes from bypass surgery. Cardiac silhouette is unremarkable. No airspace disease, effusion, or pneumothorax. No acute bony abnormalities. IMPRESSION: 1. No acute intrathoracic process. Electronically Signed   By: Randa Ngo M.D.   On: 02/29/2020 20:43    Marzetta Board, MD, PhD [facility]  Between 7 am  - 7 pm I am available, please contact me via Amion or Securechat  Between 7 pm - 7 am I am not available, please contact night coverage MD/APP via Amion

## 2020-03-01 NOTE — Progress Notes (Signed)
Site area: rt groin fa sheath Site Prior to Removal:  Level 0 Pressure Applied For: 20 minutes Manual:   yes Patient Status During Pull:  stable Post Pull Site:  Level 0 Post Pull Instructions Given:  yes Post Pull Pulses Present: rt dp palpable Dressing Applied:  Gauze and tegaderm Bedrest begins @ 9767 Comments:

## 2020-03-01 NOTE — Progress Notes (Signed)
[place] for Heparin Indication: VTE treatment ==> ACS  Allergies  Allergen Reactions  . Benadryl [Diphenhydramine Hcl] Shortness Of Breath and Swelling    Tongue swelling  . Codeine Other (See Comments)    "makes me psychotic"   . Haldol [Haloperidol Decanoate] Anaphylaxis  . Amoxicillin Hives and Swelling    Swelling to extremities Did it involve swelling of the face/tongue/throat, SOB, or low BP? Yes Did it involve sudden or severe rash/hives, skin peeling, or any reaction on the inside of your mouth or nose? Yes Did you need to seek medical attention at a hospital or doctor's office? Yes When did it last happen?young adult If all above answers are "NO", may proceed with cephalosporin use.  . Bee Venom Swelling and Other (See Comments)    "pimples" from head to toe and whole body swelling    Patient Measurements: Height: 5\' 5"  (165.1 cm) Weight: 88 kg (194 lb) IBW/kg (Calculated) : 57 Heparin Dosing Weight: 76 kg  Vital Signs: Temp: 98.4 F (36.9 C) (07/12 0400) Temp Source: Oral (07/12 0400) BP: 142/61 (07/12 0600) Pulse Rate: 63 (07/12 0600)  Labs: Recent Labs    02/29/20 2025 02/29/20 2042 02/29/20 2225 03/01/20 0131 03/01/20 0620  HGB 13.9  --   --  12.1  --   HCT 40.9  --   --  36.2  --   PLT 301  --   --  282  --   APTT  --   --  30  --   --   LABPROT  --   --  13.1  --   --   INR  --   --  1.0  --   --   HEPARINUNFRC  --   --   --  0.88* 0.89*  CREATININE  --  1.13*  --  1.04*  --   TROPONINIHS 103*  --  84* 113*  --     Estimated Creatinine Clearance: 64.6 mL/min (A) (by C-G formula based on SCr of 1.04 mg/dL (H)).   Medical History: Past Medical History:  Diagnosis Date  . Anemia    takes iron supplement  . Arthritis    knees, shoulders  . Bipolar 1 disorder ([place])   . Cataract, immature   . CHF (congestive heart failure) ([place])   . COPD (chronic obstructive pulmonary disease) ([place])   .  Coronary artery disease    triple vessel  . Family history of adverse reaction to anesthesia    states father "did not do well" with anesthesia; died last time he had surgery; also had heart disease  . Gallstones   . GERD (gastroesophageal reflux disease)    occasional - no current med.  . Glaucoma   . Hammertoe 05/2015   right second toe  . Hepatic steatosis   . High cholesterol   . History of MI (myocardial infarction)   . Hypertension    states under control with meds., has been on med. x 8 yr.  . Panic attacks   . Peripheral vascular disease ([place])    carotid  . Renal cyst, left   . Schizophrenia ([place])   . Short of breath on exertion   . Sleep apnea    no CPAP use  . Urinary, incontinence, stress female   . Wears dentures    upper  . Wears partial dentures    lower    Medications:  PTA on no oral anticoagulation  Assessment:  59  yr female presents with c/o abdominal and chest pain  PMH significant for CAD, bipolar disorder, CHF  Pharmacy consulted to dose heparin for VTE treatment  06:20 HL = 0.89 (supratherapeutic) with heparin @ 1050 units/hr No complications of therapy noted  Goal of Therapy:  Heparin level 0.3-0.7 units/ml Monitor platelets by anticoagulation protocol: Yes   Plan:   Decrease heparin gtt to 900 units/hr  Check heparin level 6 hr after heparin rate decreased  Follow heparin level and CBC daily while on heparin gtt  Poindexter, Toribio Harbour, PharmD 03/01/2020,6:59 AM

## 2020-03-01 NOTE — Interval H&P Note (Signed)
Cath Lab Visit (complete for each Cath Lab visit)  Clinical Evaluation Leading to the Procedure:   ACS: Yes.    Non-ACS:    Anginal Classification: CCS III  Anti-ischemic medical therapy: Maximal Therapy (2 or more classes of medications)  Non-Invasive Test Results: No non-invasive testing performed  Prior CABG: Previous CABG      History and Physical Interval Note:  03/01/2020 1:48 PM  [name]  has presented today for surgery, with the diagnosis of n stemi.  The various methods of treatment have been discussed with the patient and family. After consideration of risks, benefits and other options for treatment, the patient has consented to  Procedure(s): LEFT HEART CATH AND CORONARY ANGIOGRAPHY (N/A) as a surgical intervention.  The patient's history has been reviewed, patient examined, no change in status, stable for surgery.  I have reviewed the patient's chart and labs.  Questions were answered to the patient's satisfaction.     Belva Crome III

## 2020-03-01 NOTE — H&P (View-Only) (Signed)
Cardiology Consultation:   Patient ID: [name] MRN: [id number]; DOB: 1961-01-25  Admit date: 02/29/2020 Date of Consult: 03/01/2020  Primary Care Provider: Katherina Mires, MD [facility] Cardiologist: Quay Burow, MD  [facility] Electrophysiologist:  None    Patient Profile:   [name] is a 59 y.o. female with a hx of CAD s/p CABG x 3, obesity, current smoker, HTN, HLD, OSA intolerant to CPAP, and COPD who is being seen today for the evaluation of chest pain and elevated troponin at the request of Dr. Cruzita Lederer.  History of Present Illness:   [name] has a complex cardiac history with prior stenting of the RCA by Dr. Doylene Canard. She underwent CABG x 3 in 2008 (LIMA-LAD, SVG-ramus, SVG-PDA). Heart cath 02/2010 with patent grafts and normal EF. Myoview 2012 was nonischemic. Heart cath 2017 with patent grafts and unchanged anatomy from 2011 angiography. Heart cath 06/2019 with high-grade distal SVG-PDA stenosis treated with DES. Residual SVG-RI with 50% narrowing and mSVG-PDA with 5% narrowing. EF was normal. Echo did show mild diastolic dysfunction. She was last seen in clinic with Dr. Gwenlyn Found 08/26/19 and was feeling well at that time. 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  She presented to [facility] 01/26/20 via EMS for worsening chest pain but left prior to being seen. Of note, hs troponin was 13 and EKG nonischemic. Pt became angry after being asked to wait in the lobby. She called our office and was advised to wait to be seen in the ER. Pt eventually left.   She presented back to [facility] 02/29/20 with abdominal discomfort for the past several months, worse after eating. Symptoms worsened over the past several days with pain now radiating to her chest, shoulders, jaw, and arms. Of note, she reports being diagnosed with gastritis 3 days ago. She was admitted and cardiology consulted.   HS troponin 103 --> 84 --> 113 EKG sinus rhythm, Q waves  inferior and anterolatearl leads (old), RBBB (old)  On my interview, she states she "knew it was her heart" and has been "complaining about this for weeks."  She denies gastritis. She states that she has been having abdominal pain for months and points to her epigastric area. Yesterday she ate food and developed bloating and increased pain in her epigastric area which then radiated to her chest, back, shoulders, jaws, and arms that was similar to her prior MI. Of note, CE were 24 --> 28 --> 13 in 05-2019 prior to her last PCI. She experienced SOB, diaphoresis, nausea and vomiting with the pain. She reports this abdomina/chest pain occurs daily with exertion and relieved with rest. She reports yesterday chest pain was rated as a 20/10 and is now a zero on nitro drip. She continues to smoke, but states she quit 7 days ago. She denies history of OSA. No recent syncope or edema.    Past Medical History:  Diagnosis Date  . Anemia    takes iron supplement  . Arthritis    knees, shoulders  . Bipolar 1 disorder ([place])   . Cataract, immature   . CHF (congestive heart failure) ([place])   . COPD (chronic obstructive pulmonary disease) ([place])   . Coronary artery disease    triple vessel  . Family history of adverse reaction to anesthesia    states father "did not do well" with anesthesia; died last time he had surgery; also had heart disease  . Gallstones   . GERD (gastroesophageal reflux  disease)    occasional - no current med.  . Glaucoma   . Hammertoe 05/2015   right second toe  . Hepatic steatosis   . High cholesterol   . History of MI (myocardial infarction)   . Hypertension    states under control with meds., has been on med. x 8 yr.  . Panic attacks   . Peripheral vascular disease ([place])    carotid  . Renal cyst, left   . Schizophrenia ([place])   . Short of breath on exertion   . Sleep apnea    no CPAP use  . Urinary, incontinence, stress female   . Wears dentures    upper  . Wears partial  dentures    lower    Past Surgical History:  Procedure Laterality Date  . C-SECTIONS X 2  1985, 1988  . CARDIAC CATHETERIZATION  12/10/2006; 12/18/2006; 07/10/2007; 07/19/2009  . CARDIAC CATHETERIZATION  03/04/2010   patent grafts, normal LV function, diffusely diseased RCA (Dr. Adora Fridge)  . CARDIAC CATHETERIZATION N/A 02/15/2016   Procedure: Left Heart Cath and Cors/Grafts Angiography;  Surgeon: Peter M Martinique, MD;  Location: [place] [facility];  Service: Cardiovascular;  Laterality: N/A;  . CLUB FOOT RELEASE Bilateral    1st and 2nd toes  . CORONARY ARTERY BYPASS GRAFT  07/16/2007   LIMA to LAD, VG to ramus, VG to PDA   . CORONARY STENT INTERVENTION N/A 06/23/2019   Procedure: CORONARY STENT INTERVENTION;  Surgeon: Lorretta Harp, MD;  Location: [place] [facility];  Service: Cardiovascular;  Laterality: N/A;  . FLEXIBLE BRONCHOSCOPY  07/16/2007  . HAMMERTOE RECONSTRUCTION WITH WEIL OSTEOTOMY Right 06/03/2015   Procedure: RIGHT SECOND HAMMERTOE CORRECTION WITH MT WEIL OSTEOTOMY;  Surgeon: Wylene Simmer, MD;  Location: [place];  Service: Orthopedics;  Laterality: Right;  . HAND SURGERY Bilateral    1st and 2nd fingers - release of clubbing  . KNEE ARTHROSCOPY WITH ANTERIOR CRUCIATE LIGAMENT (ACL) REPAIR Right 01/30/2008  . LEFT HEART CATH AND CORS/GRAFTS ANGIOGRAPHY N/A 06/23/2019   Procedure: LEFT HEART CATH AND CORS/GRAFTS ANGIOGRAPHY;  Surgeon: Lorretta Harp, MD;  Location: [place] [facility];  Service: Cardiovascular;  Laterality: N/A;  . NOVASURE ABLATION  12/19/2007   with removal of IUD  . ORIF FOOT FRACTURE Left   . TOE SURGERIES TO REMOVE NAILS DUE TO CLUBBING    . TOTAL KNEE ARTHROPLASTY Right 10/21/2012   Procedure: TOTAL KNEE ARTHROPLASTY;  Surgeon: Mauri Pole, MD;  Location: [facility];  Service: Orthopedics;  Laterality: Right;     Home Medications:  Prior to Admission medications   Medication Sig Start Date End Date Taking? Authorizing Provider    albuterol (PROVENTIL HFA;VENTOLIN HFA) 108 (90 BASE) MCG/ACT inhaler Inhale 2 puffs into the lungs every 6 (six) hours as needed for wheezing or shortness of breath.    Yes [provider]  Alirocumab (PRALUENT) 150 MG/ML SOAJ Inject 1 pen into the skin every 14 (fourteen) days. 09/22/19  Yes Lorretta Harp, MD  aspirin 81 MG chewable tablet Chew 81 mg by mouth daily.   Yes [provider]  atorvastatin (LIPITOR) 80 MG tablet Take 1 tablet (80 mg total) by mouth daily. Need to make an appt for overdue visit 06/30/19  Yes Belva Crome, MD  BREO ELLIPTA 100-25 MCG/INH AEPB Inhale 1 puff into the lungs daily.  07/29/19  Yes [provider]  brimonidine (ALPHAGAN) 0.15 % ophthalmic solution Place 1 drop into both  eyes 2 (two) times daily.    Yes [provider]  cholecalciferol (VITAMIN D3) 25 MCG (1000 UNIT) tablet Take 1,000 Units by mouth daily.   Yes [provider]  clopidogrel (PLAVIX) 75 MG tablet Take 1 tablet (75 mg total) by mouth daily with breakfast. 06/26/19  Yes Furth, Cadence H, PA-C  escitalopram (LEXAPRO) 10 MG tablet Take 10 mg by mouth daily. 05/31/19  Yes [provider]  fluticasone (FLONASE) 50 MCG/ACT nasal spray Place 1 spray into both nostrils daily. 06/13/19  Yes [provider]  isosorbide mononitrate (IMDUR) 30 MG 24 hr tablet Take 30 mg by mouth daily.   Yes [provider]  lamoTRIgine (LAMICTAL) 150 MG tablet Take 150 mg by mouth daily.  08/05/19  Yes [provider]  metoprolol tartrate (LOPRESSOR) 25 MG tablet Take 0.5 tablets (12.5 mg total) by mouth 2 (two) times daily. 06/25/19  Yes Furth, Cadence H, PA-C  niacin (NIASPAN) 1000 MG CR tablet TAKE 1 TABLET (1,000 MG TOTAL) BY MOUTH AT BEDTIME. Patient taking differently: Take 1,000 mg by mouth at bedtime.  02/07/19  Yes Lorretta Harp, MD  nitroGLYCERIN (NITROSTAT) 0.4 MG SL tablet Place 1 tablet (0.4 mg total) under the tongue every 5  (five) minutes as needed for chest pain. 07/03/19  Yes Kilroy, Luke K, PA-C  ondansetron (ZOFRAN-ODT) 8 MG disintegrating tablet Take 8 mg by mouth every 8 (eight) hours as needed for nausea or vomiting.  02/26/20  Yes [provider]  QUEtiapine (SEROQUEL) 200 MG tablet Take 125 mg by mouth at bedtime. Takes 1 1/4 tablet (125 mg) 05/31/19  Yes [provider]  VASCEPA 1 g capsule TAKE 2 CAPSULES (2 G TOTAL) BY MOUTH 2 (TWO) TIMES DAILY. 11/26/19  Yes Lorretta Harp, MD  vitamin B-12 (CYANOCOBALAMIN) 1000 MCG tablet Take 1,000 mcg by mouth daily.   Yes [provider]    Inpatient Medications: Scheduled Meds: . aspirin EC  81 mg Oral Daily  . atorvastatin  80 mg Oral Daily  . brimonidine  1 drop Both Eyes BID  . Chlorhexidine Gluconate Cloth  6 each Topical Daily  . cholecalciferol  1,000 Units Oral Daily  . clopidogrel  75 mg Oral Q breakfast  . escitalopram  10 mg Oral Daily  . fluticasone  1 spray Each Nare Daily  . fluticasone furoate-vilanterol  1 puff Inhalation Daily  . icosapent Ethyl  2 g Oral BID  . lamoTRIgine  150 mg Oral Daily  . metoprolol tartrate  12.5 mg Oral BID  . QUEtiapine  125 mg Oral QHS   Continuous Infusions: . heparin 900 Units/hr (03/01/20 0717)  . nitroGLYCERIN 40 mcg/min (03/01/20 0831)  . potassium chloride     PRN Meds: acetaminophen, albuterol, nitroGLYCERIN, ondansetron (ZOFRAN) IV  Allergies:    Allergies  Allergen Reactions  . Benadryl [Diphenhydramine Hcl] Shortness Of Breath and Swelling    Tongue swelling  . Codeine Other (See Comments)    "makes me psychotic"   . Haldol [Haloperidol Decanoate] Anaphylaxis  . Amoxicillin Hives and Swelling    Swelling to extremities Did it involve swelling of the face/tongue/throat, SOB, or low BP? Yes Did it involve sudden or severe rash/hives, skin peeling, or any reaction on the inside of your mouth or nose? Yes Did you need to seek medical attention at a hospital or  doctor's office? Yes When did it last happen?young adult If all above answers are "NO", may proceed with cephalosporin use.  Marland Kitchen  Bee Venom Swelling and Other (See Comments)    "pimples" from head to toe and whole body swelling    Social History:   Social History   Socioeconomic History  . Marital status: Divorced    Spouse name: Not on file  . Number of children: Not on file  . Years of education: Not on file  . Highest education level: Not on file  Occupational History  . Not on file  Tobacco Use  . Smoking status: Former Smoker    Packs/day: 0.00    Years: 0.00    Pack years: 0.00    Quit date: 05/21/2015    Years since quitting: 4.7  . Smokeless tobacco: Never Used  . Tobacco comment: pt states she quit a week ago 06/14/19  Substance and Sexual Activity  . Alcohol use: No  . Drug use: No  . Sexual activity: Not on file  Other Topics Concern  . Not on file  Social History Narrative  . Not on file   Social Determinants of Health   Financial Resource Strain:   . Difficulty of Paying Living Expenses:   Food Insecurity:   . Worried About Charity fundraiser in the Last Year:   . Arboriculturist in the Last Year:   Transportation Needs:   . Film/video editor (Medical):   Marland Kitchen Lack of Transportation (Non-Medical):   Physical Activity:   . Days of Exercise per Week:   . Minutes of Exercise per Session:   Stress:   . Feeling of Stress :   Social Connections:   . Frequency of Communication with Friends and Family:   . Frequency of Social Gatherings with Friends and Family:   . Attends Religious Services:   . Active Member of Clubs or Organizations:   . Attends Archivist Meetings:   Marland Kitchen Marital Status:   Intimate Partner Violence:   . Fear of Current or Ex-Partner:   . Emotionally Abused:   Marland Kitchen Physically Abused:   . Sexually Abused:     Family History:    Family History  Problem Relation Age of Onset  . Heart disease Father   . Anesthesia  problems Father        states "did not do well" with anesthesia  . Lung cancer Mother      ROS:  Please see the history of present illness.   All other ROS reviewed and negative.     Physical Exam/Data:   Vitals:   03/01/20 0800 03/01/20 0815 03/01/20 0830 03/01/20 0834  BP: (!) 165/103 (!) 171/80 (!) 177/92   Pulse: 61 (!) 56 (!) 57   Resp: (!) 24 12 (!) 21   Temp: 98.5 F (36.9 C)     TempSrc: Oral     SpO2: 96% 94% 95% 97%  Weight:      Height:        Intake/Output Summary (Last 24 hours) at 03/01/2020 1018 Last data filed at 03/01/2020 0831 Gross per 24 hour  Intake 893.29 ml  Output --  Net 893.29 ml   Last 3 Weights 02/29/2020 01/26/2020 08/26/2019  Weight (lbs) 194 lb 198 lb 6.6 oz 198 lb  Weight (kg) 87.998 kg 90 kg 89.812 kg     Body mass index is 32.28 kg/m.  General:  Well nourished, well developed, in no acute distress HEENT: normal Lymph: no adenopathy Neck: no JVD Endocrine:  No thryomegaly Vascular: No carotid bruits; FA pulses 2+ bilaterally without bruits  Cardiac:  normal S1, S2; RRR; soft systolic murmur Lungs:  clear to auscultation bilaterally, no wheezing, rhonchi or rales  Abd: soft, nontender, no hepatomegaly  Ext: no edema Musculoskeletal:  No deformities, BUE and BLE strength normal and equal Skin: warm and dry  Neuro:  CNs 2-12 intact, no focal abnormalities noted Psych:  Normal affect   EKG:  The EKG was personally reviewed and demonstrates:   sinus rhythm HR 65, Q waves inferior and anterolatearl leads (old), RBBB (old) Telemetry:  Telemetry was personally reviewed and demonstrates:  Sinus rhythm in the 60s, can dip into the 50s when sleeping  Relevant CV Studies:  Echo 06/20/19: 1. Left ventricular ejection fraction, by visual estimation, is 60 to  65%. The left ventricle has normal function. There is mildly increased  left ventricular hypertrophy.  2. Elevated left ventricular end-diastolic pressure.  3. Left ventricular  diastolic parameters are consistent with Grade I  diastolic dysfunction (impaired relaxation).  4. Global right ventricle has normal systolic function.The right  ventricular size is normal. No increase in right ventricular wall  thickness.  5. Left atrial size was normal.  6. Right atrial size was normal.  7. The mitral valve is normal in structure. Mild mitral valve  regurgitation. No evidence of mitral stenosis.  8. The tricuspid valve is normal in structure. Tricuspid valve  regurgitation is trivial.  9. The aortic valve is normal in structure. Aortic valve regurgitation is  not visualized. No evidence of aortic valve sclerosis or stenosis.  10. The pulmonic valve was normal in structure. Pulmonic valve  regurgitation is not visualized.  11. The inferior vena cava is normal in size with greater than 50%  respiratory variability, suggesting right atrial pressure of 3 mmHg.    Left heart cath 06/23/19:  Prox Graft to Mid Graft lesion is 50% stenosed.  Dist Graft lesion is 95% stenosed.  Prox LAD to Mid LAD lesion is 100% stenosed.  Prox RCA lesion is 90% stenosed.  Mid RCA lesion is 90% stenosed.  Dist RCA lesion is 100% stenosed.  Origin to Mid Graft lesion is 50% stenosed.  A drug-eluting stent was successfully placed.  Post intervention, there is a 0% residual stenosis.   IMPRESSION: Successful PDA SVG PCI drug-eluting stenting deploying 3 drug-eluting stents (3.5 x 20 Synergy, 3.5 x 26 and 22 resolute Onyx) with ultimate resolution of her no reflow and covering of the diseased segment as well as the thrombotic lesions both proximal and distal to the original stent.  It is unclear what caused the thrombotic lesions.  She was having some chest pain at the end of the procedure which I suspect will gradually improve.  EKG showed no acute changes.  The Angiomax was turned off and the Aggrastat continues for 18 hours.  She will need dual antiplatelet therapy uninterrupted  for 12 months per guideline directed medical therapy    Laboratory Data:  High Sensitivity Troponin:   Recent Labs  Lab 02/29/20 2025 02/29/20 2225 03/01/20 0131  TROPONINIHS 103* 84* 113*     Chemistry Recent Labs  Lab 02/29/20 2042 03/01/20 0131  NA 138 136  K 3.4* 3.1*  CL 100 100  CO2 23 25  GLUCOSE 144* 168*  BUN 23* 18  CREATININE 1.13* 1.04*  CALCIUM 9.3 8.1*  GFRNONAA 54* 59*  GFRAA >60 >60  ANIONGAP 15 11    Recent Labs  Lab 02/29/20 2042  PROT 7.9  ALBUMIN 4.2  AST 21  ALT 20  ALKPHOS  117  BILITOT 1.0   Hematology Recent Labs  Lab 02/29/20 2025 03/01/20 0131  WBC 16.6* 12.1*  RBC 4.36 3.81*  HGB 13.9 12.1  HCT 40.9 36.2  MCV 93.8 95.0  MCH 31.9 31.8  MCHC 34.0 33.4  RDW 14.7 15.0  PLT 301 282   BNPNo results for input(s): BNP, PROBNP in the last 168 hours.  DDimer No results for input(s): DDIMER in the last 168 hours.   Radiology/Studies:  DG Chest 2 View  Result Date: 02/29/2020 CLINICAL DATA:  Chest pressure, abdominal pain EXAM: CHEST - 2 VIEW COMPARISON:  01/26/2020 FINDINGS: Frontal and lateral views of the chest demonstrates stable postsurgical changes from bypass surgery. Cardiac silhouette is unremarkable. No airspace disease, effusion, or pneumothorax. No acute bony abnormalities. IMPRESSION: 1. No acute intrathoracic process. Electronically Signed   By: Randa Ngo M.D.   On: 02/29/2020 20:43       TIMI Risk Score for Unstable Angina or Non-ST Elevation MI:   The patient's TIMI risk score is 5, which indicates a 26% risk of all cause mortality, new or recurrent myocardial infarction or need for urgent revascularization in the next 14 days.      Assessment and Plan:   NSTEMI CAD s/p CABG x 3 - history of multiple PCIs - hs troponin 103 --> 84 --> 113 - pt describes both typical and atypical features of chest pain - given her known disease and elevated CE, would opt for definitive angiography - continue heparin and  nitro gtt's - continue ASA, plavix, and BB - keep NPO   Hyperlipidemia 06/21/2019: VLDL 37 10/22/2019: Cholesterol, Total 117; HDL 55; LDL Chol Calc (NIH) 41; Triglycerides 117 - continue statin, vascepa, and praluent   Hypertension - imdur on hold for nitro gtt - continue BB   Hypokalemia - K 3.1 - replace per primary   COPD Current smoker - states she quit smoking 7 days ago - previously reported cessation   Bipolar disorder - continue home medications     For questions or updates, please contact [facility] Please consult [email or web address] for contact info under    Signed, Ledora Bottcher, PA  03/01/2020 10:18 AM

## 2020-03-02 ENCOUNTER — Encounter (HOSPITAL_COMMUNITY): Payer: Self-pay | Admitting: Interventional Cardiology

## 2020-03-02 ENCOUNTER — Other Ambulatory Visit: Payer: Self-pay | Admitting: Physician Assistant

## 2020-03-02 DIAGNOSIS — Z9861 Coronary angioplasty status: Secondary | ICD-10-CM

## 2020-03-02 DIAGNOSIS — I25700 Atherosclerosis of coronary artery bypass graft(s), unspecified, with unstable angina pectoris: Secondary | ICD-10-CM

## 2020-03-02 DIAGNOSIS — I2510 Atherosclerotic heart disease of native coronary artery without angina pectoris: Secondary | ICD-10-CM

## 2020-03-02 LAB — COMPREHENSIVE METABOLIC PANEL
ALT: 17 U/L (ref 0–44)
AST: 16 U/L (ref 15–41)
Albumin: 3.2 g/dL — ABNORMAL LOW (ref 3.5–5.0)
Alkaline Phosphatase: 98 U/L (ref 38–126)
Anion gap: 10 (ref 5–15)
BUN: 10 mg/dL (ref 6–20)
CO2: 24 mmol/L (ref 22–32)
Calcium: 8.7 mg/dL — ABNORMAL LOW (ref 8.9–10.3)
Chloride: 105 mmol/L (ref 98–111)
Creatinine, Ser: 0.93 mg/dL (ref 0.44–1.00)
GFR calc Af Amer: >60 mL/min (ref >60)
GFR calc non Af Amer: >60 mL/min (ref >60)
Glucose, Bld: 110 mg/dL — ABNORMAL HIGH (ref 70–99)
Potassium: 3.4 mmol/L — ABNORMAL LOW (ref 3.5–5.1)
Sodium: 139 mmol/L (ref 135–145)
Total Bilirubin: 0.7 mg/dL (ref 0.3–1.2)
Total Protein: 6.2 g/dL — ABNORMAL LOW (ref 6.5–8.1)

## 2020-03-02 LAB — CBC
HCT: 35.5 % — ABNORMAL LOW (ref 36.0–46.0)
Hemoglobin: 11.8 g/dL — ABNORMAL LOW (ref 12.0–15.0)
MCH: 31.7 pg (ref 26.0–34.0)
MCHC: 33.2 g/dL (ref 30.0–36.0)
MCV: 95.4 fL (ref 80.0–100.0)
Platelets: 259 10*3/uL (ref 150–400)
RBC: 3.72 MIL/uL — ABNORMAL LOW (ref 3.87–5.11)
RDW: 15.3 % (ref 11.5–15.5)
WBC: 10.5 10*3/uL (ref 4.0–10.5)
nRBC: 0 % (ref 0.0–0.2)

## 2020-03-02 LAB — PHOSPHORUS: Phosphorus: 3.1 mg/dL (ref 2.5–4.6)

## 2020-03-02 LAB — MAGNESIUM: Magnesium: 1.8 mg/dL (ref 1.7–2.4)

## 2020-03-02 MED ORDER — ISOSORBIDE MONONITRATE ER 60 MG PO TB24: 60.0000 mg | ORAL_TABLET | Freq: Every day | ORAL | 3 refills | Status: DC

## 2020-03-02 MED ORDER — POTASSIUM CHLORIDE CRYS ER 20 MEQ PO TBCR
20.0000 meq | EXTENDED_RELEASE_TABLET | Freq: Once | ORAL | Status: AC
  Administered 2020-03-02: 20 meq via ORAL
  Filled 2020-03-02: qty 1

## 2020-03-02 MED FILL — Lidocaine HCl Local Preservative Free (PF) Inj 1%: INTRAMUSCULAR | Qty: 30 | Status: AC

## 2020-03-02 NOTE — Progress Notes (Signed)
CARDIAC REHAB PHASE I   PRE:  Rate/Rhythm: 74 SR    BP: sitting 130/74    SaO2: 93 RA  MODE:  Ambulation: 640 ft   POST:  Rate/Rhythm: 92 SR    BP: sitting 151/96     SaO2: 96 Ra  Pt able to ambulate without angina. Sts she feels well. Talkative but receptive to education. Discussed MI, restrictions, risk factors, diet, smoking cessation, exercise, NTG and CRPII. Pt is eager to quit smoking and do CRPII. Will refer to [place], ACSM 03/02/2020 9:41 AM

## 2020-03-02 NOTE — Discharge Instructions (Signed)

## 2020-03-02 NOTE — Progress Notes (Addendum)
Progress Note  Patient Name: [name] Date of Encounter: 03/02/2020  Primary Cardiologist: Quay Burow, MD   Subjective   Denies any further CP. Cath yesterday with patent LIMA>LAD, diffusely dz but patent SVG>IM, occluded mid and distal SVG to PDA with L>R collaterals supplying LV and PDA, diffusely disease LM but patent, occluded LAD after D1, patent LCx and OM with 60-70% ostial and mid vessel stenosis unchanged from prior study and EF 40% with normal LVEDP.    Inpatient Medications    Scheduled Meds: . aspirin  81 mg Oral Daily  . atorvastatin  80 mg Oral Daily  . brimonidine  1 drop Both Eyes BID  . Chlorhexidine Gluconate Cloth  6 each Topical Daily  . cholecalciferol  1,000 Units Oral Daily  . clopidogrel  75 mg Oral Q breakfast  . escitalopram  10 mg Oral Daily  . fluticasone  1 spray Each Nare Daily  . fluticasone furoate-vilanterol  1 puff Inhalation Daily  . heparin  5,000 Units Subcutaneous Q8H  . icosapent Ethyl  2 g Oral BID  . isosorbide mononitrate  30 mg Oral Daily  . lamoTRIgine  150 mg Oral Daily  . metoprolol tartrate  12.5 mg Oral BID  . QUEtiapine  125 mg Oral QHS  . sodium chloride flush  3 mL Intravenous Q12H   Continuous Infusions: . sodium chloride     PRN Meds: sodium chloride, acetaminophen, albuterol, nitroGLYCERIN, ondansetron (ZOFRAN) IV, oxyCODONE, sodium chloride flush   Vital Signs    Vitals:   03/02/20 0600 03/02/20 0800 03/02/20 0807 03/02/20 0813  BP: (!) 158/73  125/87 125/87  Pulse: 65  75 75  Resp: 20  19   Temp:   98 F (36.7 C)   TempSrc:   Oral   SpO2:  96% 96%   Weight:      Height:        Intake/Output Summary (Last 24 hours) at 03/02/2020 0959 Last data filed at 03/02/2020 0600 Gross per 24 hour  Intake 774.54 ml  Output 1100 ml  Net -325.46 ml   Filed Weights   02/29/20 1956  Weight: 88 kg    Telemetry    NSR - Personally Reviewed  ECG    No new EKG to review - Personally  Reviewed  Physical Exam   GEN: No acute distress.   Neck: No JVD Cardiac: RRR, no murmurs, rubs, or gallops.  Respiratory: Clear to auscultation bilaterally. GI: Soft, nontender, non-distended  MS: No edema; No deformity.  Right radial artery cath site clean, dry with no hematoma Neuro:  Nonfocal  Psych: Normal affect   Labs    Chemistry Recent Labs  Lab 02/29/20 2042 03/01/20 0131 03/02/20 0229  NA 138 136 139  K 3.4* 3.1* 3.4*  CL 100 100 105  CO2 23 25 24   GLUCOSE 144* 168* 110*  BUN 23* 18 10  CREATININE 1.13* 1.04* 0.93  CALCIUM 9.3 8.1* 8.7*  PROT 7.9  --  6.2*  ALBUMIN 4.2  --  3.2*  AST 21  --  16  ALT 20  --  17  ALKPHOS 117  --  98  BILITOT 1.0  --  0.7  GFRNONAA 54* 59* >60  GFRAA >60 >60 >60  ANIONGAP 15 11 10      Hematology Recent Labs  Lab 02/29/20 2025 03/01/20 0131 03/02/20 0229  WBC 16.6* 12.1* 10.5  RBC 4.36 3.81* 3.72*  HGB 13.9 12.1 11.8*  HCT 40.9 36.2 35.5*  MCV 93.8 95.0 95.4  MCH 31.9 31.8 31.7  MCHC 34.0 33.4 33.2  RDW 14.7 15.0 15.3  PLT 301 282 259    Cardiac EnzymesNo results for input(s): TROPONINI in the last 168 hours. No results for input(s): TROPIPOC in the last 168 hours.   BNPNo results for input(s): BNP, PROBNP in the last 168 hours.   DDimer No results for input(s): DDIMER in the last 168 hours.   Radiology    DG Chest 2 View  Result Date: 02/29/2020 CLINICAL DATA:  Chest pressure, abdominal pain EXAM: CHEST - 2 VIEW COMPARISON:  01/26/2020 FINDINGS: Frontal and lateral views of the chest demonstrates stable postsurgical changes from bypass surgery. Cardiac silhouette is unremarkable. No airspace disease, effusion, or pneumothorax. No acute bony abnormalities. IMPRESSION: 1. No acute intrathoracic process. Electronically Signed   By: Randa Ngo M.D.   On: 02/29/2020 20:43   CARDIAC CATHETERIZATION  Result Date: 03/01/2020  Patent left internal mammary graft to the LAD.  Diffusely diseased but patent  saphenous vein graft to the ramus intermedius.  Total occlusion of the mid and distal segments of the saphenous vein graft to the PDA.  PDA and other left ventricular branches of the right coronary regular supplied by left to right collaterals.  Patent but diffusely diseased native left main.  Functional occlusion of the left anterior descending after the origin of the first diagonal.  The segment from the ostial LAD into the diagonal is diffusely diseased up to 60 to 70%.  Patent circumflex artery that includes a single obtuse marginal.  Ostial to mid vessel luminal irregularities with eccentric stenoses up to 60 to 70%.  Unchanged from prior imaging.  Inferobasal akinesis.  EF 40%.  LVEDP 14 mmHg on IV nitroglycerin RECOMMENDATIONS:  Continue aggressive risk factor modification.  Add long-acting nitrates to improve collateral flow.  IV nitroglycerin is discontinued.  Subcutaneous heparin DVT prophylaxis.  Continue dual antiplatelet therapy but consider clopidogrel monotherapy after 3 months.  ECHOCARDIOGRAM COMPLETE  Result Date: 03/01/2020    ECHOCARDIOGRAM REPORT   Patient Name:   [name] Date of Exam: 03/01/2020 Medical Rec #:  [id number]         Height:       65.0 in Accession #:    [id number]        Weight:       194.0 lb Date of Birth:  10-13-1960         BSA:          1.953 m Patient Age:    59 years          BP:           122/73 mmHg Patient Gender: F                 HR:           58 bpm. 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 Prior CABG; Risk Factors:Hypertension and                 Dyslipidemia.  Sonographer:    Johny Chess Referring Phys: [place]  1. Left ventricular ejection fraction, by estimation, is 55 to 60%. The left ventricle has normal function. The left ventricle  has no regional  wall motion abnormalities. Left ventricular diastolic parameters were normal.  2. Right ventricular systolic function is normal. The right ventricular size is normal. Tricuspid regurgitation signal is inadequate for assessing PA pressure.  3. The mitral valve is normal in structure. Trivial mitral valve regurgitation. No evidence of mitral stenosis.  4. The aortic valve is tricuspid. Aortic valve regurgitation is not visualized. Mild to moderate aortic valve sclerosis/calcification is present, without any evidence of aortic stenosis.  5. The inferior vena cava is normal in size with greater than 50% respiratory variability, suggesting right atrial pressure of 3 mmHg. FINDINGS  Left Ventricle: Left ventricular ejection fraction, by estimation, is 55 to 60%. The left ventricle has normal function. The left ventricle has no regional wall motion abnormalities. The left ventricular internal cavity size was normal in size. There is  no left ventricular hypertrophy. Left ventricular diastolic parameters were normal. Normal left ventricular filling pressure. Right Ventricle: The right ventricular size is normal. No increase in right ventricular wall thickness. Right ventricular systolic function is normal. Tricuspid regurgitation signal is inadequate for assessing PA pressure. Left Atrium: Left atrial size was normal in size. Right Atrium: Right atrial size was normal in size. Pericardium: There is no evidence of pericardial effusion. Mitral Valve: The mitral valve is normal in structure. Normal mobility of the mitral valve leaflets. Trivial mitral valve regurgitation. No evidence of mitral valve stenosis. Tricuspid Valve: The tricuspid valve is normal in structure. Tricuspid valve regurgitation is trivial. No evidence of tricuspid stenosis. Aortic Valve: The aortic valve is tricuspid. Aortic valve regurgitation is not visualized. Mild to moderate aortic valve sclerosis/calcification is present, without any evidence of  aortic stenosis. Pulmonic Valve: The pulmonic valve was normal in structure. Pulmonic valve regurgitation is trivial. No evidence of pulmonic stenosis. Aorta: The aortic root is normal in size and structure. Venous: The inferior vena cava was not well visualized. The inferior vena cava is normal in size with greater than 50% respiratory variability, suggesting right atrial pressure of 3 mmHg. IAS/Shunts: No atrial level shunt detected by color flow Doppler.  LEFT VENTRICLE PLAX 2D LVIDd:         5.40 cm  Diastology LVIDs:         3.90 cm  LV e' lateral:   11.50 cm/s LV PW:         0.90 cm  LV E/e' lateral: 7.2 LV IVS:        1.00 cm  LV e' medial:    10.20 cm/s LVOT diam:     1.90 cm  LV E/e' medial:  8.1 LV SV:         54 LV SV Index:   28 LVOT Area:     2.84 cm  RIGHT VENTRICLE             IVC RV S prime:     11.40 cm/s  IVC diam: 1.70 cm TAPSE (M-mode): 2.0 cm LEFT ATRIUM             Index       RIGHT ATRIUM           Index LA diam:        3.60 cm 1.84 cm/m  RA Area:     17.40 cm LA Vol (A2C):   58.5 ml 29.96 ml/m RA Volume:   44.20 ml  22.64 ml/m LA Vol (A4C):   85.5 ml 43.79 ml/m LA Biplane Vol: 73.1 ml 37.44 ml/m  AORTIC VALVE LVOT  Vmax:   84.20 cm/s LVOT Vmean:  51.800 cm/s LVOT VTI:    0.190 m  AORTA Ao Root diam: 2.90 cm MITRAL VALVE MV Area (PHT): 1.78 cm    SHUNTS MV Decel Time: 427 msec    Systemic VTI:  0.19 m MV E velocity: 82.30 cm/s  Systemic Diam: 1.90 cm MV A velocity: 62.10 cm/s MV E/A ratio:  1.33 Fransico Him MD Electronically signed by Fransico Him MD Signature Date/Time: 03/01/2020/11:58:49 PM    Final     Cardiac Studies   Cardiac Cath 03/01/2020 Conclusion   Patent left internal mammary graft to the LAD.  Diffusely diseased but patent saphenous vein graft to the ramus intermedius.  Total occlusion of the mid and distal segments of the saphenous vein graft to the PDA.  PDA and other left ventricular branches of the right coronary regular supplied by left to right  collaterals.  Patent but diffusely diseased native left main.  Functional occlusion of the left anterior descending after the origin of the first diagonal.  The segment from the ostial LAD into the diagonal is diffusely diseased up to 60 to 70%.  Patent circumflex artery that includes a single obtuse marginal.  Ostial to mid vessel luminal irregularities with eccentric stenoses up to 60 to 70%.  Unchanged from prior imaging.  Inferobasal akinesis.  EF 40%.  LVEDP 14 mmHg on IV nitroglycerin  RECOMMENDATIONS:   Continue aggressive risk factor modification.  Add long-acting nitrates to improve collateral flow.  IV nitroglycerin is discontinued.  Subcutaneous heparin DVT prophylaxis.  Continue dual antiplatelet therapy but consider clopidogrel monotherapy after 3 months.  2D echo 03/01/2020 IMPRESSIONS   1. Left ventricular ejection fraction, by estimation, is 55 to 60%. The  left ventricle has normal function. The left ventricle has no regional  wall motion abnormalities. Left ventricular diastolic parameters were  normal.  2. Right ventricular systolic function is normal. The right ventricular  size is normal. Tricuspid regurgitation signal is inadequate for assessing  PA pressure.  3. The mitral valve is normal in structure. Trivial mitral valve  regurgitation. No evidence of mitral stenosis.  4. The aortic valve is tricuspid. Aortic valve regurgitation is not  visualized. Mild to moderate aortic valve sclerosis/calcification is  present, without any evidence of aortic stenosis.  5. The inferior vena cava is normal in size with greater than 50%  respiratory variability, suggesting right atrial pressure of 3 mmHg.   Patient Profile     59 y.o. female  with a hx of CAD s/p CABG x 3, obesity, current smoker, HTN, HLD, OSA intolerant to CPAP, and COPD who is being seen  for the evaluation of chest pain and elevated troponin at the request of Dr. Cruzita Lederer.  Assessment &  Plan    1.  NSTEMI -hsTrop peaked at 113 -no further CP -Cath yesterday with patent LIMA>LAD, diffusely dz but patent SVG>IM, occluded mid and distal SVG to PDA with L>R collaterals supplying LV and PDA, diffusely disease LM but patent, occluded LAD after D1, patent LCx and OM with 60-70% ostial and mid vessel stenosis unchanged from prior study and EF 40% with normal LVEDP.   -medical management recommended -continue ASA 81mg  daily x 3 months and then stop (hx of gastritis recently), Plavix 75mg  daily long term, statin and BB -increase Imdur to 60mg  daily  2.  CAD s/p CABG x 3 - history of multiple PCIs - continue ASA, plavix, statin and BB  3.  Hyperlipidemia -10/22/2019: Cholesterol, Total  117; HDL 55; LDL Chol Calc (NIH) 41; Triglycerides 117 -continue statin, vascepa, and praluent  4.  Hypertension -BP controlled at 125/44mmHg -continue Lopressor 12.5mg  BID  5.  Hypokalemia - K 3.1 - replace per primary  6.  COPD/Current smoker - states she quit smoking 7 days ago - previously reported cessation  I have spent a total of 35 minutes with patient reviewing cardiac cath, 2D echo , telemetry, EKGs, labs and examining patient as well as establishing an assessment and plan that was discussed with the patient.  > 50% of time was spent in direct patient care.     [facility] will sign off.   Medication Recommendations:  ASA 81mg  daily, Plavix 75mg  daily, Lopressor 12.5mg  BID, atorvastatin 80mg  daily, Vascepa 2gm BID, Imdur 60mg  daily, Praluent Other recommendations (labs, testing, etc):  none Follow up as an outpatient:  Dr. Gwenlyn Found in 7-10 days or PA  For questions or updates, please contact [facility] Please consult [email or web address] for contact info under Cardiology/STEMI.      Signed, Fransico Him, MD  03/02/2020, 9:59 AM

## 2020-03-02 NOTE — Discharge Summary (Signed)
Discharge Summary  [name] ZOX:[id number] DOB: Nov 21, 1960  PCP: Katherina Mires, MD  Admit date: 02/29/2020 Discharge date: 03/02/2020  Time spent: 40 mins  Recommendations for Outpatient Follow-up:  1. Follow-up with PCP 2. Follow-up with cardiology as scheduled    Discharge Diagnoses:  Active Hospital Problems   Diagnosis Date Noted  . NSTEMI (non-ST elevated myocardial infarction) ([place])   . Coronary artery disease due to lipid rich plaque   . Essential hypertension 03/17/2015  . Coronary artery disease involving coronary bypass graft 12/24/2013  . Hyperlipidemia LDL goal <70 12/24/2013    Resolved Hospital Problems  No resolved problems to display.    Discharge Condition: Stable  Diet recommendation: Heart healthy  Vitals:   03/02/20 0807 03/02/20 0813  BP: 125/87 125/87  Pulse: 75 75  Resp: 19   Temp: 98 F (36.7 C)   SpO2: 96%     History of present illness:  59 year old female with history of triple-vessel CAD, prior MI, CABG x3 in the past status post multiple stents.  Most recently had a cardiac cath in November 2020 status post DES to graft to RPDA.  She has been on aspirin and Plavix at home and reports compliance except for the last 1 to 2 days due to feeling sick.  She presents to the hospital with worsening chest pain radiating into her left jaw and left shoulder consistent with prior MI symptoms.  She was placed on nitroglycerin infusion and heparin in the ED and cardiology was consulted.   Today, patient denies any new complaints, denies any chest pain, shortness of breath, abdominal pain, nausea/vomiting, fever/chills, dizziness.  Patient stable for discharge to follow-up with cardiac rehab, cardiology and PCP.  Advised to stop smoking.  Hospital Course:  Principal Problem:   NSTEMI (non-ST elevated myocardial infarction) ([place]) Active Problems:   Hyperlipidemia LDL goal <70   Coronary artery disease involving coronary bypass graft    Essential hypertension   Coronary artery disease due to lipid rich plaque   Non-STEMI/CAD s/p CABG x3 Extensive CAD history as above Troponin mildly elevated Status post cath on 03/01/2020, please see report Cardiology consulted, continue aspirin, statin, Plavix as well as metoprolol, imdur Follow-up with cardiology as an outpatient  Hypokalemia Replaced as needed  Active Problems Essential hypertension-continue metoprolol  Hyperlipidemia-continue statin and Vascepa  Tobacco use-advised to quit smoking  Bipolar disorder-stable on medications, continue Seroquel and Lamictal        Malnutrition Type:      Malnutrition Characteristics:      Nutrition Interventions:      Estimated body mass index is 32.28 kg/m as calculated from the following:   Height as of this encounter: 5\' 5"  (1.651 m).   Weight as of this encounter: 88 kg.    Procedures:  Cardiac cath on 03/01/2020  Consultations:  Cardiology  Discharge Exam: BP 125/87   Pulse 75   Temp 98 F (36.7 C) (Oral)   Resp 19   Ht 5\' 5"  (1.651 m)   Wt 88 kg   LMP 11/16/2011   SpO2 96%   BMI 32.28 kg/m   General: NAD Cardiovascular: S1, S2 present Respiratory: CTA B  Discharge Instructions You were cared for by a hospitalist during your hospital stay. If you have any questions about your discharge medications or the care you received while you were in the hospital after you are discharged, you can call the unit and asked to speak with the hospitalist on call if the hospitalist that  took care of you is not available. Once you are discharged, your primary care physician will handle any further medical issues. Please note that NO REFILLS for any discharge medications will be authorized once you are discharged, as it is imperative that you return to your primary care physician (or establish a relationship with a primary care physician if you do not have one) for your aftercare needs so that they can  reassess your need for medications and monitor your lab values.  Discharge Instructions    Amb Referral to Cardiac Rehabilitation   Complete by: As directed    Diagnosis: NSTEMI   After initial evaluation and assessments completed: Virtual Based Care may be provided alone or in conjunction with Phase 2 Cardiac Rehab based on patient barriers.: Yes   Diet - low sodium heart healthy   Complete by: As directed    Increase activity slowly   Complete by: As directed      Allergies as of 03/02/2020      Reactions   Benadryl [diphenhydramine Hcl] Shortness Of Breath, Swelling   Tongue swelling   Codeine Other (See Comments)   "makes me psychotic"   Haldol [haloperidol Decanoate] Anaphylaxis   Amoxicillin Hives, Swelling   Swelling to extremities Did it involve swelling of the face/tongue/throat, SOB, or low BP? Yes Did it involve sudden or severe rash/hives, skin peeling, or any reaction on the inside of your mouth or nose? Yes Did you need to seek medical attention at a hospital or doctor's office? Yes When did it last happen?young adult If all above answers are "NO", may proceed with cephalosporin use.   Bee Venom Swelling, Other (See Comments)   "pimples" from head to toe and whole body swelling      Medication List    TAKE these medications   albuterol 108 (90 Base) MCG/ACT inhaler Commonly known as: VENTOLIN HFA Inhale 2 puffs into the lungs every 6 (six) hours as needed for wheezing or shortness of breath.   aspirin 81 MG chewable tablet Chew 81 mg by mouth daily.   atorvastatin 80 MG tablet Commonly known as: LIPITOR Take 1 tablet (80 mg total) by mouth daily. Need to make an appt for overdue visit   Breo Ellipta 100-25 MCG/INH Aepb Generic drug: fluticasone furoate-vilanterol Inhale 1 puff into the lungs daily.   brimonidine 0.15 % ophthalmic solution Commonly known as: ALPHAGAN Place 1 drop into both eyes 2 (two) times daily.   cholecalciferol 25 MCG (1000  UNIT) tablet Commonly known as: VITAMIN D3 Take 1,000 Units by mouth daily.   clopidogrel 75 MG tablet Commonly known as: PLAVIX Take 1 tablet (75 mg total) by mouth daily with breakfast.   escitalopram 10 MG tablet Commonly known as: LEXAPRO Take 10 mg by mouth daily.   fluticasone 50 MCG/ACT nasal spray Commonly known as: FLONASE Place 1 spray into both nostrils daily.   isosorbide mononitrate 30 MG 24 hr tablet Commonly known as: IMDUR Take 30 mg by mouth daily.   lamoTRIgine 150 MG tablet Commonly known as: LAMICTAL Take 150 mg by mouth daily.   metoprolol tartrate 25 MG tablet Commonly known as: LOPRESSOR Take 0.5 tablets (12.5 mg total) by mouth 2 (two) times daily.   niacin 1000 MG CR tablet Commonly known as: NIASPAN TAKE 1 TABLET (1,000 MG TOTAL) BY MOUTH AT BEDTIME. What changed: See the new instructions.   nitroGLYCERIN 0.4 MG SL tablet Commonly known as: NITROSTAT Place 1 tablet (0.4 mg total) under the tongue  every 5 (five) minutes as needed for chest pain.   ondansetron 8 MG disintegrating tablet Commonly known as: ZOFRAN-ODT Take 8 mg by mouth every 8 (eight) hours as needed for nausea or vomiting.   Praluent 150 MG/ML [place] Generic drug: Alirocumab Inject 1 pen into the skin every 14 (fourteen) days.   QUEtiapine 200 MG tablet Commonly known as: SEROQUEL Take 125 mg by mouth at bedtime. Takes 1 1/4 tablet (125 mg)   Vascepa 1 g capsule Generic drug: icosapent Ethyl TAKE 2 CAPSULES (2 G TOTAL) BY MOUTH 2 (TWO) TIMES DAILY.   vitamin B-12 1000 MCG tablet Commonly known as: CYANOCOBALAMIN Take 1,000 mcg by mouth daily.      Allergies  Allergen Reactions  . Benadryl [Diphenhydramine Hcl] Shortness Of Breath and Swelling    Tongue swelling  . Codeine Other (See Comments)    "makes me psychotic"   . Haldol [Haloperidol Decanoate] Anaphylaxis  . Amoxicillin Hives and Swelling    Swelling to extremities Did it involve swelling of the  face/tongue/throat, SOB, or low BP? Yes Did it involve sudden or severe rash/hives, skin peeling, or any reaction on the inside of your mouth or nose? Yes Did you need to seek medical attention at a hospital or doctor's office? Yes When did it last happen?young adult If all above answers are "NO", may proceed with cephalosporin use.  . Bee Venom Swelling and Other (See Comments)    "pimples" from head to toe and whole body swelling    Follow-up Information    Katherina Mires, MD. Schedule an appointment as soon as possible for a visit in 1 week.   Specialty: Family Medicine Why: March 09, 2020 @ 1:PM Contact information: [place] [address] [phone]        Lorretta Harp, MD.   Specialties: Cardiology, Radiology Why: Aug. 4, 2021 @9AM  Contact information: [address] [place]  [address] [phone]                The results of significant diagnostics from this hospitalization (including imaging, microbiology, ancillary and laboratory) are listed below for reference.    Significant Diagnostic Studies: DG Chest 2 View  Result Date: 02/29/2020 CLINICAL DATA:  Chest pressure, abdominal pain EXAM: CHEST - 2 VIEW COMPARISON:  01/26/2020 FINDINGS: Frontal and lateral views of the chest demonstrates stable postsurgical changes from bypass surgery. Cardiac silhouette is unremarkable. No airspace disease, effusion, or pneumothorax. No acute bony abnormalities. IMPRESSION: 1. No acute intrathoracic process. Electronically Signed   By: Randa Ngo M.D.   On: 02/29/2020 20:43   CARDIAC CATHETERIZATION  Result Date: 03/01/2020  Patent left internal mammary graft to the LAD.  Diffusely diseased but patent saphenous vein graft to the ramus intermedius.  Total occlusion of the mid and distal segments of the saphenous vein graft to the PDA.  PDA and other left ventricular branches of the right coronary regular  supplied by left to right collaterals.  Patent but diffusely diseased native left main.  Functional occlusion of the left anterior descending after the origin of the first diagonal.  The segment from the ostial LAD into the diagonal is diffusely diseased up to 60 to 70%.  Patent circumflex artery that includes a single obtuse marginal.  Ostial to mid vessel luminal irregularities with eccentric stenoses up to 60 to 70%.  Unchanged from prior imaging.  Inferobasal akinesis.  EF 40%.  LVEDP 14 mmHg on IV nitroglycerin RECOMMENDATIONS:  Continue aggressive risk factor modification.  Add long-acting nitrates to improve collateral flow.  IV nitroglycerin is discontinued.  Subcutaneous heparin DVT prophylaxis.  Continue dual antiplatelet therapy but consider clopidogrel monotherapy after 3 months.  ECHOCARDIOGRAM COMPLETE  Result Date: 03/01/2020    ECHOCARDIOGRAM REPORT   Patient Name:   KEDRA MCGLADE Date of Exam: 03/01/2020 Medical Rec #:  [id number]         Height:       65.0 in Accession #:    [id number]        Weight:       194.0 lb Date of Birth:  11-Jul-1961         BSA:          1.953 m Patient Age:    22 years          BP:           122/73 mmHg Patient Gender: F                 HR:           58 bpm. 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 Prior CABG; Risk Factors:Hypertension and                 Dyslipidemia.  Sonographer:    Johny Chess Referring Phys: [place]  1. Left ventricular ejection fraction, by estimation, is 55 to 60%. The left ventricle has normal function. The left ventricle has no regional wall motion abnormalities. Left ventricular diastolic parameters were normal.  2. Right ventricular systolic function is normal. The right ventricular size is normal. Tricuspid regurgitation signal is  inadequate for assessing PA pressure.  3. The mitral valve is normal in structure. Trivial mitral valve regurgitation. No evidence of mitral stenosis.  4. The aortic valve is tricuspid. Aortic valve regurgitation is not visualized. Mild to moderate aortic valve sclerosis/calcification is present, without any evidence of aortic stenosis.  5. The inferior vena cava is normal in size with greater than 50% respiratory variability, suggesting right atrial pressure of 3 mmHg. FINDINGS  Left Ventricle: Left ventricular ejection fraction, by estimation, is 55 to 60%. The left ventricle has normal function. The left ventricle has no regional wall motion abnormalities. The left ventricular internal cavity size was normal in size. There is  no left ventricular hypertrophy. Left ventricular diastolic parameters were normal. Normal left ventricular filling pressure. Right Ventricle: The right ventricular size is normal. No increase in right ventricular wall thickness. Right ventricular systolic function is normal. Tricuspid regurgitation signal is inadequate for assessing PA pressure. Left Atrium: Left atrial size was normal in size. Right Atrium: Right atrial size was normal in size. Pericardium: There is no evidence of pericardial effusion. Mitral Valve: The mitral valve is normal in structure. Normal mobility of the mitral valve leaflets. Trivial mitral valve regurgitation. No evidence of mitral valve stenosis. Tricuspid Valve: The tricuspid valve is normal in structure. Tricuspid valve regurgitation is trivial. No evidence of tricuspid stenosis. Aortic Valve: The aortic valve is tricuspid. Aortic valve regurgitation is not visualized. Mild to moderate aortic valve sclerosis/calcification is present, without any evidence of aortic stenosis. Pulmonic Valve: The pulmonic valve was normal in structure. Pulmonic valve regurgitation is trivial.  No evidence of pulmonic stenosis. Aorta: The aortic root is normal in size and  structure. Venous: The inferior vena cava was not well visualized. The inferior vena cava is normal in size with greater than 50% respiratory variability, suggesting right atrial pressure of 3 mmHg. IAS/Shunts: No atrial level shunt detected by color flow Doppler.  LEFT VENTRICLE PLAX 2D LVIDd:         5.40 cm  Diastology LVIDs:         3.90 cm  LV e' lateral:   11.50 cm/s LV PW:         0.90 cm  LV E/e' lateral: 7.2 LV IVS:        1.00 cm  LV e' medial:    10.20 cm/s LVOT diam:     1.90 cm  LV E/e' medial:  8.1 LV SV:         54 LV SV Index:   28 LVOT Area:     2.84 cm  RIGHT VENTRICLE             IVC RV S prime:     11.40 cm/s  IVC diam: 1.70 cm TAPSE (M-mode): 2.0 cm LEFT ATRIUM             Index       RIGHT ATRIUM           Index LA diam:        3.60 cm 1.84 cm/m  RA Area:     17.40 cm LA Vol (A2C):   58.5 ml 29.96 ml/m RA Volume:   44.20 ml  22.64 ml/m LA Vol (A4C):   85.5 ml 43.79 ml/m LA Biplane Vol: 73.1 ml 37.44 ml/m  AORTIC VALVE LVOT Vmax:   84.20 cm/s LVOT Vmean:  51.800 cm/s LVOT VTI:    0.190 m  AORTA Ao Root diam: 2.90 cm MITRAL VALVE MV Area (PHT): 1.78 cm    SHUNTS MV Decel Time: 427 msec    Systemic VTI:  0.19 m MV E velocity: 82.30 cm/s  Systemic Diam: 1.90 cm MV A velocity: 62.10 cm/s MV E/A ratio:  1.33 Fransico Him MD Electronically signed by Fransico Him MD Signature Date/Time: 03/01/2020/11:58:49 PM    Final     Microbiology: Recent Results (from the past 240 hour(s))  SARS Coronavirus 2 by RT PCR (hospital order, performed in [place] hospital lab) Nasopharyngeal Nasopharyngeal Swab     Status: None   Collection Time: 02/29/20 11:13 PM   Specimen: Nasopharyngeal Swab  Result Value Ref Range Status   SARS Coronavirus 2 NEGATIVE NEGATIVE Final    Comment: (NOTE) SARS-CoV-2 target nucleic acids are NOT DETECTED.  The SARS-CoV-2 RNA is generally detectable in upper and lower respiratory specimens during the acute phase of infection. The lowest concentration of SARS-CoV-2  viral copies this assay can detect is 250 copies / mL. A negative result does not preclude SARS-CoV-2 infection and should not be used as the sole basis for treatment or other patient management decisions.  A negative result may occur with improper specimen collection / handling, submission of specimen other than nasopharyngeal swab, presence of viral mutation(s) within the areas targeted by this assay, and inadequate number of viral copies (<250 copies / mL). A negative result must be combined with clinical observations, patient history, and epidemiological information.  Fact Sheet for Patients:   StrictlyIdeas.no  Fact Sheet for Healthcare Providers: BankingDealers.co.za  This test is not yet approved or  cleared by the [place] FDA  and has been authorized for detection and/or diagnosis of SARS-CoV-2 by FDA under an Emergency Use Authorization (EUA).  This EUA will remain in effect (meaning this test can be used) for the duration of the COVID-19 declaration under Section 564(b)(1) of the Act, 21 U.S.C. section 360bbb-3(b)(1), unless the authorization is terminated or revoked sooner.  Performed at [facility], [place] [address] [place], [place] [address]   MRSA PCR Screening     Status: None   Collection Time: 03/01/20  1:13 AM   Specimen: Nasal Mucosa; Nasopharyngeal  Result Value Ref Range Status   MRSA by PCR NEGATIVE NEGATIVE Final    Comment:        The GeneXpert MRSA Assay (FDA approved for NASAL specimens only), is one component of a comprehensive MRSA colonization surveillance program. It is not intended to diagnose MRSA infection nor to guide or monitor treatment for MRSA infections. Performed at [facility], [place] [address], [place], [place] [address]      Labs: Basic Metabolic Panel: Recent Labs  Lab 02/29/20 2042 03/01/20 0131 03/02/20 0229  NA 138 136 139  K 3.4*  3.1* 3.4*  CL 100 100 105  CO2 23 25 24   GLUCOSE 144* 168* 110*  BUN 23* 18 10  CREATININE 1.13* 1.04* 0.93  CALCIUM 9.3 8.1* 8.7*  MG  --   --  1.8  PHOS  --   --  3.1   Liver Function Tests: Recent Labs  Lab 02/29/20 2042 03/02/20 0229  AST 21 16  ALT 20 17  ALKPHOS 117 98  BILITOT 1.0 0.7  PROT 7.9 6.2*  ALBUMIN 4.2 3.2*   Recent Labs  Lab 02/29/20 2042  LIPASE 37   No results for input(s): AMMONIA in the last 168 hours. CBC: Recent Labs  Lab 02/29/20 2025 03/01/20 0131 03/02/20 0229  WBC 16.6* 12.1* 10.5  HGB 13.9 12.1 11.8*  HCT 40.9 36.2 35.5*  MCV 93.8 95.0 95.4  PLT 301 282 259   Cardiac Enzymes: No results for input(s): CKTOTAL, CKMB, CKMBINDEX, TROPONINI in the last 168 hours. BNP: BNP (last 3 results) No results for input(s): BNP in the last 8760 hours.  ProBNP (last 3 results) No results for input(s): PROBNP in the last 8760 hours.  CBG: No results for input(s): GLUCAP in the last 168 hours.     Signed:  Alma Friendly, MD [facility] 03/02/2020, 2:29 PM

## 2020-03-02 NOTE — Plan of Care (Signed)

## 2020-03-04 ENCOUNTER — Telehealth (HOSPITAL_COMMUNITY): Payer: Self-pay

## 2020-03-04 ENCOUNTER — Ambulatory Visit: Payer: Medicare Other | Admitting: Cardiology

## 2020-03-04 NOTE — Telephone Encounter (Signed)
Pt insurance is active and benefits verified through Medicare a/b Co-pay 0, DED 0/0 met, out of pocket 0/0 met, co-insurance 0%. no pre-authorization required. Passport, 03/04/2020_0 :[name], REF# [id number]  2ndary insurance is active and benefits verified through Medicaid. Co-pay 0, DED 0/0 met, out of pocket 0/0 met, co-insurance 0%. No pre-authorization required, REF# [id number]  Will contact patient to see if she is interested in the Cardiac Rehab Program. If interested, patient will need to complete follow up appt. Once completed, patient will be contacted for scheduling upon review by the RN Navigator.

## 2020-03-05 ENCOUNTER — Other Ambulatory Visit: Payer: Self-pay

## 2020-03-05 ENCOUNTER — Inpatient Hospital Stay (HOSPITAL_COMMUNITY)
Admission: EM | Admit: 2020-03-05 | Discharge: 2020-03-07 | DRG: 303 | Disposition: A | Discharge status: Home / Self Care | Payer: Medicare Other | Attending: Internal Medicine | Admitting: Internal Medicine

## 2020-03-05 ENCOUNTER — Encounter (HOSPITAL_COMMUNITY): Payer: Self-pay | Admitting: Emergency Medicine

## 2020-03-05 ENCOUNTER — Emergency Department (HOSPITAL_COMMUNITY)
Admission: EM | Admit: 2020-03-05 | Discharge: 2020-03-05 | Discharge status: Against Medical Advice | Payer: Medicare Other | Source: Home / Self Care

## 2020-03-05 ENCOUNTER — Emergency Department (HOSPITAL_COMMUNITY): Payer: Medicare Other

## 2020-03-05 ENCOUNTER — Telehealth: Payer: Self-pay | Admitting: Cardiology

## 2020-03-05 DIAGNOSIS — Z20822 Contact with and (suspected) exposure to covid-19: Secondary | ICD-10-CM | POA: Diagnosis present

## 2020-03-05 DIAGNOSIS — I25110 Atherosclerotic heart disease of native coronary artery with unstable angina pectoris: Secondary | ICD-10-CM | POA: Diagnosis not present

## 2020-03-05 DIAGNOSIS — R0789 Other chest pain: Secondary | ICD-10-CM | POA: Diagnosis not present

## 2020-03-05 DIAGNOSIS — H409 Unspecified glaucoma: Secondary | ICD-10-CM | POA: Diagnosis present

## 2020-03-05 DIAGNOSIS — Z955 Presence of coronary angioplasty implant and graft: Secondary | ICD-10-CM

## 2020-03-05 DIAGNOSIS — M19011 Primary osteoarthritis, right shoulder: Secondary | ICD-10-CM | POA: Diagnosis present

## 2020-03-05 DIAGNOSIS — F209 Schizophrenia, unspecified: Secondary | ICD-10-CM | POA: Diagnosis present

## 2020-03-05 DIAGNOSIS — Z801 Family history of malignant neoplasm of trachea, bronchus and lung: Secondary | ICD-10-CM | POA: Diagnosis not present

## 2020-03-05 DIAGNOSIS — R079 Chest pain, unspecified: Secondary | ICD-10-CM | POA: Diagnosis not present

## 2020-03-05 DIAGNOSIS — M19012 Primary osteoarthritis, left shoulder: Secondary | ICD-10-CM | POA: Diagnosis present

## 2020-03-05 DIAGNOSIS — Z96651 Presence of right artificial knee joint: Secondary | ICD-10-CM | POA: Diagnosis present

## 2020-03-05 DIAGNOSIS — G4733 Obstructive sleep apnea (adult) (pediatric): Secondary | ICD-10-CM | POA: Diagnosis present

## 2020-03-05 DIAGNOSIS — I2510 Atherosclerotic heart disease of native coronary artery without angina pectoris: Secondary | ICD-10-CM | POA: Diagnosis not present

## 2020-03-05 DIAGNOSIS — I25810 Atherosclerosis of coronary artery bypass graft(s) without angina pectoris: Secondary | ICD-10-CM | POA: Diagnosis present

## 2020-03-05 DIAGNOSIS — I10 Essential (primary) hypertension: Secondary | ICD-10-CM | POA: Diagnosis present

## 2020-03-05 DIAGNOSIS — Z885 Allergy status to narcotic agent status: Secondary | ICD-10-CM

## 2020-03-05 DIAGNOSIS — K219 Gastro-esophageal reflux disease without esophagitis: Secondary | ICD-10-CM | POA: Diagnosis present

## 2020-03-05 DIAGNOSIS — Z87891 Personal history of nicotine dependence: Secondary | ICD-10-CM | POA: Diagnosis not present

## 2020-03-05 DIAGNOSIS — Z9861 Coronary angioplasty status: Secondary | ICD-10-CM | POA: Diagnosis not present

## 2020-03-05 DIAGNOSIS — I509 Heart failure, unspecified: Secondary | ICD-10-CM | POA: Diagnosis present

## 2020-03-05 DIAGNOSIS — Z79899 Other long term (current) drug therapy: Secondary | ICD-10-CM

## 2020-03-05 DIAGNOSIS — I739 Peripheral vascular disease, unspecified: Secondary | ICD-10-CM | POA: Diagnosis present

## 2020-03-05 DIAGNOSIS — E785 Hyperlipidemia, unspecified: Secondary | ICD-10-CM | POA: Diagnosis not present

## 2020-03-05 DIAGNOSIS — M1712 Unilateral primary osteoarthritis, left knee: Secondary | ICD-10-CM | POA: Diagnosis present

## 2020-03-05 DIAGNOSIS — I110 Hypertensive heart disease with heart failure: Secondary | ICD-10-CM | POA: Diagnosis present

## 2020-03-05 DIAGNOSIS — Z880 Allergy status to penicillin: Secondary | ICD-10-CM

## 2020-03-05 DIAGNOSIS — Z7902 Long term (current) use of antithrombotics/antiplatelets: Secondary | ICD-10-CM

## 2020-03-05 DIAGNOSIS — Z7951 Long term (current) use of inhaled steroids: Secondary | ICD-10-CM

## 2020-03-05 DIAGNOSIS — J449 Chronic obstructive pulmonary disease, unspecified: Secondary | ICD-10-CM | POA: Diagnosis present

## 2020-03-05 DIAGNOSIS — I200 Unstable angina: Secondary | ICD-10-CM | POA: Diagnosis not present

## 2020-03-05 DIAGNOSIS — Z8249 Family history of ischemic heart disease and other diseases of the circulatory system: Secondary | ICD-10-CM | POA: Diagnosis not present

## 2020-03-05 DIAGNOSIS — F319 Bipolar disorder, unspecified: Secondary | ICD-10-CM | POA: Diagnosis present

## 2020-03-05 DIAGNOSIS — E7800 Pure hypercholesterolemia, unspecified: Secondary | ICD-10-CM | POA: Diagnosis present

## 2020-03-05 DIAGNOSIS — I252 Old myocardial infarction: Secondary | ICD-10-CM | POA: Diagnosis not present

## 2020-03-05 DIAGNOSIS — K760 Fatty (change of) liver, not elsewhere classified: Secondary | ICD-10-CM | POA: Diagnosis present

## 2020-03-05 DIAGNOSIS — Z888 Allergy status to other drugs, medicaments and biological substances status: Secondary | ICD-10-CM

## 2020-03-05 DIAGNOSIS — Z91030 Bee allergy status: Secondary | ICD-10-CM

## 2020-03-05 DIAGNOSIS — Z96659 Presence of unspecified artificial knee joint: Secondary | ICD-10-CM

## 2020-03-05 DIAGNOSIS — Z7982 Long term (current) use of aspirin: Secondary | ICD-10-CM

## 2020-03-05 DIAGNOSIS — Z951 Presence of aortocoronary bypass graft: Secondary | ICD-10-CM

## 2020-03-05 LAB — CBC
HCT: 39 % (ref 36.0–46.0)
Hemoglobin: 12.7 g/dL (ref 12.0–15.0)
MCH: 31.7 pg (ref 26.0–34.0)
MCHC: 32.6 g/dL (ref 30.0–36.0)
MCV: 97.3 fL (ref 80.0–100.0)
Platelets: 283 10*3/uL (ref 150–400)
RBC: 4.01 MIL/uL (ref 3.87–5.11)
RDW: 15.8 % — ABNORMAL HIGH (ref 11.5–15.5)
WBC: 15.3 10*3/uL — ABNORMAL HIGH (ref 4.0–10.5)
nRBC: 0 % (ref 0.0–0.2)

## 2020-03-05 LAB — SARS CORONAVIRUS 2 BY RT PCR (HOSPITAL ORDER, PERFORMED IN ~~LOC~~ HOSPITAL LAB): SARS Coronavirus 2: NEGATIVE

## 2020-03-05 LAB — BASIC METABOLIC PANEL
Anion gap: 14 (ref 5–15)
BUN: 26 mg/dL — ABNORMAL HIGH (ref 6–20)
CO2: 22 mmol/L (ref 22–32)
Calcium: 9.3 mg/dL (ref 8.9–10.3)
Chloride: 103 mmol/L (ref 98–111)
Creatinine, Ser: 0.97 mg/dL (ref 0.44–1.00)
GFR calc Af Amer: >60 mL/min (ref >60)
GFR calc non Af Amer: >60 mL/min (ref >60)
Glucose, Bld: 204 mg/dL — ABNORMAL HIGH (ref 70–99)
Potassium: 4.2 mmol/L (ref 3.5–5.1)
Sodium: 139 mmol/L (ref 135–145)

## 2020-03-05 LAB — TROPONIN I (HIGH SENSITIVITY)
Troponin I (High Sensitivity): 23 ng/L — ABNORMAL HIGH (ref <18)
Troponin I (High Sensitivity): 68 ng/L — ABNORMAL HIGH (ref <18)

## 2020-03-05 MED ORDER — HEPARIN BOLUS VIA INFUSION
4000.0000 [IU] | Freq: Once | INTRAVENOUS | Status: AC
  Administered 2020-03-05: 4000 [IU] via INTRAVENOUS
  Filled 2020-03-05: qty 4000

## 2020-03-05 MED ORDER — HEPARIN (PORCINE) 25000 UT/250ML-% IV SOLN
900.0000 [IU]/h | INTRAVENOUS | Status: DC
  Administered 2020-03-05 – 2020-03-06 (×2): 900 [IU]/h via INTRAVENOUS
  Filled 2020-03-05 (×2): qty 250

## 2020-03-05 MED ORDER — MORPHINE SULFATE (PF) 4 MG/ML IV SOLN
4.0000 mg | Freq: Once | INTRAVENOUS | Status: AC
  Administered 2020-03-05: 4 mg via INTRAVENOUS
  Filled 2020-03-05: qty 1

## 2020-03-05 MED ORDER — SODIUM CHLORIDE 0.9% FLUSH: 3.0000 mL | Freq: Once | INTRAVENOUS | Status: DC

## 2020-03-05 MED ORDER — ACETAMINOPHEN 325 MG PO TABS: 650.0000 mg | ORAL_TABLET | ORAL | Status: DC | PRN

## 2020-03-05 MED ORDER — MORPHINE SULFATE (PF) 2 MG/ML IV SOLN
2.0000 mg | Freq: Once | INTRAVENOUS | Status: AC
  Administered 2020-03-05: 2 mg via INTRAVENOUS
  Filled 2020-03-05: qty 1

## 2020-03-05 MED ORDER — ONDANSETRON HCL 4 MG/2ML IJ SOLN
4.0000 mg | Freq: Four times a day (QID) | INTRAMUSCULAR | Status: DC | PRN
  Administered 2020-03-05: 4 mg via INTRAVENOUS
  Filled 2020-03-05: qty 2

## 2020-03-05 MED ORDER — CLOPIDOGREL BISULFATE 75 MG PO TABS
75.0000 mg | ORAL_TABLET | Freq: Once | ORAL | Status: AC
  Administered 2020-03-05: 75 mg via ORAL
  Filled 2020-03-05 (×2): qty 1

## 2020-03-05 MED ORDER — ASPIRIN 81 MG PO CHEW
324.0000 mg | CHEWABLE_TABLET | Freq: Once | ORAL | Status: AC
  Administered 2020-03-05: 324 mg via ORAL
  Filled 2020-03-05: qty 4

## 2020-03-05 MED ORDER — ONDANSETRON HCL 4 MG/2ML IJ SOLN
4.0000 mg | Freq: Once | INTRAMUSCULAR | Status: AC
  Administered 2020-03-05: 4 mg via INTRAVENOUS
  Filled 2020-03-05: qty 2

## 2020-03-05 MED ORDER — NITROGLYCERIN IN D5W 200-5 MCG/ML-% IV SOLN
0.0000 ug/min | INTRAVENOUS | Status: DC
  Administered 2020-03-05: 25 ug/min via INTRAVENOUS
  Filled 2020-03-05: qty 250

## 2020-03-05 MED ORDER — NITROGLYCERIN 0.4 MG SL SUBL
0.4000 mg | SUBLINGUAL_TABLET | SUBLINGUAL | Status: DC | PRN
  Administered 2020-03-05: 0.4 mg via SUBLINGUAL
  Filled 2020-03-05: qty 1

## 2020-03-05 NOTE — Telephone Encounter (Signed)
Returned call to patient.She stated she is having same symptoms she had when she had a MI.Stated she was discharged from [facility] this past Tuesday.She is presently having pain and a bloated feeling in upper abdomen.Nausea and vomiting.Rates pain # 10.Advised to call 911.Wannetta Sender was notified.

## 2020-03-05 NOTE — Telephone Encounter (Signed)
Agree with suggestion to call 911

## 2020-03-05 NOTE — Progress Notes (Signed)
ANTICOAGULATION CONSULT NOTE - Initial Consult  Pharmacy Consult for heparin Indication:ACS/STEMI  Allergies  Allergen Reactions  . Benadryl [Diphenhydramine Hcl] Shortness Of Breath and Swelling    Tongue swelling  . Codeine Other (See Comments)    "makes me psychotic"   . Haldol [Haloperidol Decanoate] Anaphylaxis  . Amoxicillin Hives and Swelling    Swelling to extremities Did it involve swelling of the face/tongue/throat, SOB, or low BP? Yes Did it involve sudden or severe rash/hives, skin peeling, or any reaction on the inside of your mouth or nose? Yes Did you need to seek medical attention at a hospital or doctor's office? Yes When did it last happen?young adult If all above answers are "NO", may proceed with cephalosporin use.  . Bee Venom Swelling and Other (See Comments)    "pimples" from head to toe and whole body swelling    Patient Measurements:   Heparin Dosing Weight: 76 kg  Vital Signs: Temp: 98.7 F (37.1 C) (07/16 1748) Temp Source: Oral (07/16 1748) BP: 180/92 (07/16 1823) Pulse Rate: 73 (07/16 1823)  Labs: Recent Labs    03/05/20 1400 03/05/20 1720  HGB 12.7  --   HCT 39.0  --   PLT 283  --   CREATININE 0.97  --   TROPONINIHS 23* 68*    Estimated Creatinine Clearance: 69.3 mL/min (by C-G formula based on SCr of 0.97 mg/dL).   Medical History: Past Medical History:  Diagnosis Date  . Anemia    takes iron supplement  . Arthritis    knees, shoulders  . Bipolar 1 disorder ([place])   . Cataract, immature   . CHF (congestive heart failure) ([place])   . COPD (chronic obstructive pulmonary disease) ([place])   . Coronary artery disease    triple vessel  . Family history of adverse reaction to anesthesia    states father "did not do well" with anesthesia; died last time he had surgery; also had heart disease  . Gallstones   . GERD (gastroesophageal reflux disease)    occasional - no current med.  . Glaucoma   . Hammertoe 05/2015   right  second toe  . Hepatic steatosis   . High cholesterol   . History of MI (myocardial infarction)   . Hypertension    states under control with meds., has been on med. x 8 yr.  . Panic attacks   . Peripheral vascular disease ([place])    carotid  . Renal cyst, left   . Schizophrenia ([place])   . Short of breath on exertion   . Sleep apnea    no CPAP use  . Urinary, incontinence, stress female   . Wears dentures    upper  . Wears partial dentures    lower     Assessment: 59 yo F to start heparin per pharmacy for chest pain/ACS/STEMI.  She was just in the hospital 03/01/20 and pharmacy dose heparin for ACS then. Her heparin level was 0.89 on 1050 units/hr. CBC WNL. SCr WNL.  No anticoagulants PTA.  No bleeding PTA.   Goal of Therapy:  Heparin level 0.3-0.7 units/ml Monitor platelets by anticoagulation protocol: Yes   Plan:  Give 4000 units bolus x 1 Start heparin infusion at 900 units/hr Check anti-Xa level in 6 hours and daily while on heparin Continue to monitor H&H and platelets  Eudelia Bunch, Pharm.D 03/05/2020 6:49 PM

## 2020-03-05 NOTE — ED Triage Notes (Signed)
Per pt, states she had same symptoms on the 12th-states she had a stent placed-states she has blockages in her heart and "they cant fix them"-states she was at [facility] and left because she didn't want to wait-states dr Gwenlyn Found told her to come to ED-patient complaining  Of lower abdominal pain radiating up to epigastric area and to neck

## 2020-03-05 NOTE — ED Notes (Signed)
Patient states, "I refuse to wait in the waiting room, I refuse blood work and am going to [facility]. I am leaving."

## 2020-03-05 NOTE — Telephone Encounter (Signed)
Pt got discharged from hospital on 02/29/20. Experiencing same symptoms. Pt says she is bloated and throwing up. Have a bad headache. Sounds SOB

## 2020-03-05 NOTE — ED Provider Notes (Signed)
[facility] DEPT Provider Note   CSN: [id number] Arrival date & time: 03/05/20  1253     History Chief Complaint  Patient presents with  . Chest Pain    [name] is a 59 y.o. female.   Chest Pain Pain location:  L chest Pain quality: pressure   Timing:  Constant Progression:  Worsening Chronicity:  Recurrent Context: at rest   Relieved by:  Nothing Worsened by:  Nothing Ineffective treatments:  Nitroglycerin Associated symptoms: nausea and vomiting   Associated symptoms: no back pain, no cough, no fever, no headache, no palpitations and no shortness of breath   Risk factors: coronary artery disease        Past Medical History:  Diagnosis Date  . Anemia    takes iron supplement  . Arthritis    knees, shoulders  . Bipolar 1 disorder ([place])   . Cataract, immature   . CHF (congestive heart failure) ([place])   . COPD (chronic obstructive pulmonary disease) ([place])   . Coronary artery disease    triple vessel  . Family history of adverse reaction to anesthesia    states father "did not do well" with anesthesia; died last time he had surgery; also had heart disease  . Gallstones   . GERD (gastroesophageal reflux disease)    occasional - no current med.  . Glaucoma   . Hammertoe 05/2015   right second toe  . Hepatic steatosis   . High cholesterol   . History of MI (myocardial infarction)   . Hypertension    states under control with meds., has been on med. x 8 yr.  . Panic attacks   . Peripheral vascular disease ([place])    carotid  . Renal cyst, left   . Schizophrenia ([place])   . Short of breath on exertion   . Sleep apnea    no CPAP use  . Urinary, incontinence, stress female   . Wears dentures    upper  . Wears partial dentures    lower    Patient Active Problem List   Diagnosis Date Noted  . Chest pain 03/05/2020  . Coronary artery disease due to lipid rich plaque   . CAD- S/P PCI 07/03/2019  . Hx of CABG  07/03/2019  . Cardiac chest pain   . ACS (acute coronary syndrome) ([place]) 06/20/2019  . NSTEMI (non-ST elevated myocardial infarction) ([place])   . Unstable angina ([place]) 02/15/2016  . Essential hypertension 03/17/2015  . Hyperlipidemia LDL goal <70 12/24/2013  . Coronary artery disease involving coronary bypass graft 12/24/2013  . Expected blood loss anemia 10/22/2012  . Obesity 10/22/2012  . S/P right TKA 10/21/2012    Past Surgical History:  Procedure Laterality Date  . C-SECTIONS X 2  1985, 1988  . CARDIAC CATHETERIZATION  12/10/2006; 12/18/2006; 07/10/2007; 07/19/2009  . CARDIAC CATHETERIZATION  03/04/2010   patent grafts, normal LV function, diffusely diseased RCA (Dr. Adora Fridge)  . CARDIAC CATHETERIZATION N/A 02/15/2016   Procedure: Left Heart Cath and Cors/Grafts Angiography;  Surgeon: Peter M Martinique, MD;  Location: [facility];  Service: Cardiovascular;  Laterality: N/A;  . CLUB FOOT RELEASE Bilateral    1st and 2nd toes  . CORONARY ARTERY BYPASS GRAFT  07/16/2007   LIMA to LAD, VG to ramus, VG to PDA   . CORONARY STENT INTERVENTION N/A 06/23/2019   Procedure: CORONARY STENT INTERVENTION;  Surgeon: Lorretta Harp, MD;  Location: [facility];  Service: Cardiovascular;  Laterality: N/A;  . FLEXIBLE BRONCHOSCOPY  07/16/2007  . HAMMERTOE RECONSTRUCTION WITH WEIL OSTEOTOMY Right 06/03/2015   Procedure: RIGHT SECOND HAMMERTOE CORRECTION WITH MT WEIL OSTEOTOMY;  Surgeon: Wylene Simmer, MD;  Location: [facility];  Service: Orthopedics;  Laterality: Right;  . HAND SURGERY Bilateral    1st and 2nd fingers - release of clubbing  . KNEE ARTHROSCOPY WITH ANTERIOR CRUCIATE LIGAMENT (ACL) REPAIR Right 01/30/2008  . LEFT HEART CATH AND CORS/GRAFTS ANGIOGRAPHY N/A 06/23/2019   Procedure: LEFT HEART CATH AND CORS/GRAFTS ANGIOGRAPHY;  Surgeon: Lorretta Harp, MD;  Location: [facility];  Service: Cardiovascular;  Laterality: N/A;  . LEFT HEART CATH AND CORS/GRAFTS  ANGIOGRAPHY N/A 03/01/2020   Procedure: LEFT HEART CATH AND CORS/GRAFTS ANGIOGRAPHY;  Surgeon: Belva Crome, MD;  Location: [facility];  Service: Cardiovascular;  Laterality: N/A;  . NOVASURE ABLATION  12/19/2007   with removal of IUD  . ORIF FOOT FRACTURE Left   . TOE SURGERIES TO REMOVE NAILS DUE TO CLUBBING    . TOTAL KNEE ARTHROPLASTY Right 10/21/2012   Procedure: TOTAL KNEE ARTHROPLASTY;  Surgeon: Mauri Pole, MD;  Location: [facility];  Service: Orthopedics;  Laterality: Right;     OB History   No obstetric history on file.     Family History  Problem Relation Age of Onset  . Heart disease Father   . Anesthesia problems Father        states "did not do well" with anesthesia  . Lung cancer Mother     Social History   Tobacco Use  . Smoking status: Former Smoker    Packs/day: 0.00    Years: 0.00    Pack years: 0.00    Quit date: 05/21/2015    Years since quitting: 4.7  . Smokeless tobacco: Never Used  . Tobacco comment: pt states she quit a week ago 06/14/19  Substance Use Topics  . Alcohol use: No  . Drug use: No    Home Medications Prior to Admission medications   Medication Sig Start Date End Date Taking? Authorizing Provider  albuterol (PROVENTIL HFA;VENTOLIN HFA) 108 (90 BASE) MCG/ACT inhaler Inhale 2 puffs into the lungs every 6 (six) hours as needed for wheezing or shortness of breath.    Yes [provider]  Alirocumab (PRALUENT) 150 MG/ML SOAJ Inject 1 pen into the skin every 14 (fourteen) days. 09/22/19  Yes Lorretta Harp, MD  aspirin 81 MG chewable tablet Chew 81 mg by mouth daily.   Yes [provider]  atorvastatin (LIPITOR) 80 MG tablet Take 1 tablet (80 mg total) by mouth daily. Need to make an appt for overdue visit 06/30/19  Yes Belva Crome, MD  BREO ELLIPTA 100-25 MCG/INH AEPB Inhale 1 puff into the lungs daily.  07/29/19  Yes [provider]  brimonidine (ALPHAGAN) 0.15 % ophthalmic solution Place 1 drop into both  eyes 2 (two) times daily.    Yes [provider]  cholecalciferol (VITAMIN D3) 25 MCG (1000 UNIT) tablet Take 1,000 Units by mouth daily.   Yes [provider]  clopidogrel (PLAVIX) 75 MG tablet Take 1 tablet (75 mg total) by mouth daily with breakfast. 06/26/19  Yes Furth, Cadence H, PA-C  escitalopram (LEXAPRO) 10 MG tablet Take 10 mg by mouth daily. 05/31/19  Yes [provider]  fluticasone (FLONASE) 50 MCG/ACT nasal spray Place 1 spray into both nostrils daily. 06/13/19  Yes [provider]  isosorbide mononitrate (IMDUR) 60 MG  24 hr tablet Take 1 tablet (60 mg total) by mouth daily. 03/02/20 03/02/21 Yes Duke, Tami Lin, PA  lamoTRIgine (LAMICTAL) 150 MG tablet Take 150 mg by mouth daily.  08/05/19  Yes [provider]  metoprolol tartrate (LOPRESSOR) 25 MG tablet Take 0.5 tablets (12.5 mg total) by mouth 2 (two) times daily. 06/25/19  Yes Furth, Cadence H, PA-C  niacin (NIASPAN) 1000 MG CR tablet TAKE 1 TABLET (1,000 MG TOTAL) BY MOUTH AT BEDTIME. Patient taking differently: Take 1,000 mg by mouth at bedtime.  02/07/19  Yes Lorretta Harp, MD  nitroGLYCERIN (NITROSTAT) 0.4 MG SL tablet Place 1 tablet (0.4 mg total) under the tongue every 5 (five) minutes as needed for chest pain. 07/03/19  Yes Kilroy, Luke K, PA-C  ondansetron (ZOFRAN-ODT) 8 MG disintegrating tablet Take 8 mg by mouth every 8 (eight) hours as needed for nausea or vomiting.  02/26/20  Yes [provider]  QUEtiapine (SEROQUEL) 200 MG tablet Take 125 mg by mouth at bedtime. Takes 1 1/4 tablet (125 mg) 05/31/19  Yes [provider]  VASCEPA 1 g capsule TAKE 2 CAPSULES (2 G TOTAL) BY MOUTH 2 (TWO) TIMES DAILY. 11/26/19  Yes Lorretta Harp, MD  vitamin B-12 (CYANOCOBALAMIN) 1000 MCG tablet Take 1,000 mcg by mouth daily.   Yes [provider]  QUEtiapine (SEROQUEL) 100 MG tablet Take 150 mg by mouth at bedtime. Patient not taking: Reported on 03/05/2020 02/23/20    [provider]    Allergies    Benadryl [diphenhydramine hcl], Codeine, Haldol [haloperidol decanoate], Amoxicillin, and Bee venom  Review of Systems   Review of Systems  Constitutional: Negative for chills and fever.  HENT: Negative for congestion and rhinorrhea.   Respiratory: Negative for cough and shortness of breath.   Cardiovascular: Positive for chest pain. Negative for palpitations.  Gastrointestinal: Positive for nausea and vomiting. Negative for diarrhea.  Genitourinary: Negative for difficulty urinating and dysuria.  Musculoskeletal: Negative for arthralgias and back pain.  Skin: Negative for rash and wound.  Neurological: Negative for light-headedness and headaches.    Physical Exam Updated Vital Signs BP (!) 151/92   Pulse 63   Temp 98 F (36.7 C) (Oral)   Resp 20   Ht 5\' 5"  (1.651 m)   Wt 89 kg   LMP 11/16/2011   SpO2 100%   BMI 32.65 kg/m   Physical Exam Vitals and nursing note reviewed. Exam conducted with a chaperone present.  Constitutional:      General: She is not in acute distress.    Appearance: Normal appearance.  HENT:     Head: Normocephalic and atraumatic.     Nose: No rhinorrhea.  Eyes:     General:        Right eye: No discharge.        Left eye: No discharge.     Conjunctiva/sclera: Conjunctivae normal.  Cardiovascular:     Rate and Rhythm: Normal rate. Rhythm irregular.     Heart sounds: Normal heart sounds.  Pulmonary:     Effort: Pulmonary effort is normal. No respiratory distress.     Breath sounds: No stridor. No decreased breath sounds or wheezing.  Abdominal:     General: Abdomen is flat. There is no distension.     Palpations: Abdomen is soft.  Musculoskeletal:        General: No tenderness or signs of injury.  Skin:    General: Skin is warm and dry.  Neurological:  General: No focal deficit present.     Mental Status: She is alert. Mental status is at baseline.     Motor: No weakness.  Psychiatric:         Mood and Affect: Mood normal.        Behavior: Behavior normal.     ED Results / Procedures / Treatments   Labs (all labs ordered are listed, but only abnormal results are displayed) Labs Reviewed  BASIC METABOLIC PANEL - Abnormal; Notable for the following components:      Result Value   Glucose, Bld 204 (*)    BUN 26 (*)    All other components within normal limits  CBC - Abnormal; Notable for the following components:   WBC 15.3 (*)    RDW 15.8 (*)    All other components within normal limits  CBC - Abnormal; Notable for the following components:   WBC 13.2 (*)    RBC 3.51 (*)    Hemoglobin 11.2 (*)    HCT 33.8 (*)    RDW 15.7 (*)    All other components within normal limits  TROPONIN I (HIGH SENSITIVITY) - Abnormal; Notable for the following components:   Troponin I (High Sensitivity) 23 (*)    All other components within normal limits  TROPONIN I (HIGH SENSITIVITY) - Abnormal; Notable for the following components:   Troponin I (High Sensitivity) 68 (*)    All other components within normal limits  SARS CORONAVIRUS 2 BY RT PCR (HOSPITAL ORDER, [facility])  MRSA PCR SCREENING  HEPARIN LEVEL (UNFRACTIONATED)  HEPARIN LEVEL (UNFRACTIONATED)    EKG None  Radiology DG Chest 2 View  Result Date: 03/05/2020 CLINICAL DATA:  Chest pain. EXAM: CHEST - 2 VIEW COMPARISON:  February 29, 2020. FINDINGS: The heart size and mediastinal contours are within normal limits. Both lungs are clear. No pneumothorax or pleural effusion is noted. Status post coronary bypass graft. The visualized skeletal structures are unremarkable. IMPRESSION: No active cardiopulmonary disease. Electronically Signed   By: Marijo Conception M.D.   On: 03/05/2020 14:21    Procedures Procedures (including critical care time)  Medications Ordered in ED Medications  nitroGLYCERIN (NITROSTAT) SL tablet 0.4 mg (0.4 mg Sublingual Given 03/05/20 1719)  heparin ADULT infusion 100 units/mL  (25000 units/295mL sodium chloride 0.45%) (900 Units/hr Intravenous Rate/Dose Verify 03/06/20 0600)  brimonidine (ALPHAGAN) 0.15 % ophthalmic solution 1 drop (1 drop Both Eyes Not Given 03/06/20 0445)  aspirin chewable tablet 81 mg (has no administration in time range)  niacin (NIASPAN) CR tablet 1,000 mg (1,000 mg Oral Given 03/06/20 0157)  QUEtiapine (SEROQUEL) tablet 125 mg (125 mg Oral Given 03/06/20 0156)  fluticasone (FLONASE) 50 MCG/ACT nasal spray 1 spray (has no administration in time range)  escitalopram (LEXAPRO) tablet 10 mg (has no administration in time range)  metoprolol tartrate (LOPRESSOR) tablet 12.5 mg (12.5 mg Oral Given 03/06/20 0156)  clopidogrel (PLAVIX) tablet 75 mg (has no administration in time range)  atorvastatin (LIPITOR) tablet 80 mg (has no administration in time range)  nitroGLYCERIN (NITROSTAT) SL tablet 0.4 mg (has no administration in time range)  lamoTRIgine (LAMICTAL) tablet 150 mg (has no administration in time range)  fluticasone furoate-vilanterol (BREO ELLIPTA) 100-25 MCG/INH 1 puff (has no administration in time range)  ondansetron (ZOFRAN-ODT) disintegrating tablet 8 mg (has no administration in time range)  cholecalciferol (VITAMIN D3) tablet 1,000 Units (has no administration in time range)  vitamin B-12 (CYANOCOBALAMIN) tablet 1,000 mcg (has no  administration in time range)  acetaminophen (TYLENOL) tablet 650 mg (has no administration in time range)  ondansetron (ZOFRAN) injection 4 mg (4 mg Intravenous Given 03/05/20 2059)  Chlorhexidine Gluconate Cloth 2 % PADS 6 each (6 each Topical Given 03/06/20 0025)  albuterol (PROVENTIL) (2.5 MG/3ML) 0.083% nebulizer solution 2.5 mg (has no administration in time range)  Alirocumab SOAJ 150 mcg (has no administration in time range)  isosorbide mononitrate (IMDUR) 24 hr tablet 90 mg (has no administration in time range)  ranolazine (RANEXA) 12 hr tablet 500 mg (has no administration in time range)  ondansetron  (ZOFRAN) injection 4 mg (4 mg Intravenous Given 03/05/20 1730)  aspirin chewable tablet 324 mg (324 mg Oral Given 03/05/20 1719)  clopidogrel (PLAVIX) tablet 75 mg (75 mg Oral Given 03/05/20 1923)  morphine 4 MG/ML injection 4 mg (4 mg Intravenous Given 03/05/20 1828)  heparin bolus via infusion 4,000 Units (4,000 Units Intravenous Bolus from Bag 03/05/20 2003)  morphine 2 MG/ML injection 2 mg (2 mg Intravenous Given 03/05/20 2250)    ED Course  I have reviewed the triage vital signs and the nursing notes.  Pertinent labs & imaging results that were available during my care of the patient were reviewed by me and considered in my medical decision making (see chart for details).    MDM Rules/Calculators/A&P                          History of severe coronary artery disease medically managed, recently discharged from work-up for NSTEMI, having worsening pain, threw up home medications today.  Will be given aspirin Plavix nitroglycerin was temporary relief at home however did not last.  EKG reviewed by myself shows no acute changes, no ST elevation MI, otherwise likely secondary to chronic coronary disease.  Basic laboratory screening reviewed by myself is fairly unremarkable, she does have an elevation in troponin but it is decreased from her last admission.  She will likely need admission for further symptomatic control of her unstable angina.  The patient's initial troponin is elevated, the nitro glycerin infusion is improving her symptoms.  She is given additional morphine for pain control.  I have feel that she needs admission for unstable angina, she has severe multivessel coronary artery disease but previously was not surgical candidate, I feel that she is in need of reevaluation and optimal medical optimization.  She agrees to this plan, I spoke to the hospitalist for admission, they agree that they will be willing to admit her, I spoke to a cardiologist on-call they agreed to admit for further  work-up and medical optimization and reevaluation of surgical candidacy.  Patient's other laboratory evaluation studies are unremarkable after my review.  Chest x-ray is reviewed by myself and radiology with no acute cardiopulmonary pathology found.  She is stable for transfer to the admission service of internal medicine.   Final Clinical Impression(s) / ED Diagnoses Final diagnoses:  None    Rx / DC Orders ED Discharge Orders    None       Breck Coons, MD 03/06/20 [phone]

## 2020-03-05 NOTE — H&P (Signed)
History and Physical   [name] UYQ:[id number] DOB: Dec 14, 1960 DOA: 03/05/2020  Referring MD/NP/PA: Dr. Ron Parker  PCP: Katherina Mires, MD   Outpatient Specialists: Dr. Alben Spittle  Patient coming from: Home  Chief Complaint: Chest pain  HPI: [name] is a 59 y.o. female with medical history significant of inoperable coronary artery disease hypertension hyperlipidemia CABG, status post right TKA, morbid obesity, who was recently admitted to the hospital and was discharged with medical management of angina.  Patient was seen by cardiology at the time and was told she has two-vessel disease with 100% occlusion of one of them.  She came back today secondary to worsening chest pain at home unable to function.  She took off her medicines today but vomited.  Her pain is rated as 7-9 out of 10.  Also some nausea and some vomiting patient has been initiated on IV nitroglycerin morphine and other supportive medications.  She is being admitted with persistent ongoing angina chest pain..  ED Course: Temperature 98.7 blood pressure 117/89 pulse 79 respirate of 32 oxygen sat 91% on room air.  White count 15.3 BUN 26 otherwise rest of CBC and chemistry within normal.  Chest x-ray is normal EKG showed no acute findings.  Patient being admitted with intractable chest pain secondary to known inoperable angina  Review of Systems: As per HPI otherwise 10 point review of systems negative.    Past Medical History:  Diagnosis Date  . Anemia    takes iron supplement  . Arthritis    knees, shoulders  . Bipolar 1 disorder ([place])   . Cataract, immature   . CHF (congestive heart failure) ([place])   . COPD (chronic obstructive pulmonary disease) ([place])   . Coronary artery disease    triple vessel  . Family history of adverse reaction to anesthesia    states father "did not do well" with anesthesia; died last time he had surgery; also had heart disease  . Gallstones   . GERD (gastroesophageal reflux  disease)    occasional - no current med.  . Glaucoma   . Hammertoe 05/2015   right second toe  . Hepatic steatosis   . High cholesterol   . History of MI (myocardial infarction)   . Hypertension    states under control with meds., has been on med. x 8 yr.  . Panic attacks   . Peripheral vascular disease ([place])    carotid  . Renal cyst, left   . Schizophrenia ([place])   . Short of breath on exertion   . Sleep apnea    no CPAP use  . Urinary, incontinence, stress female   . Wears dentures    upper  . Wears partial dentures    lower    Past Surgical History:  Procedure Laterality Date  . C-SECTIONS X 2  1985, 1988  . CARDIAC CATHETERIZATION  12/10/2006; 12/18/2006; 07/10/2007; 07/19/2009  . CARDIAC CATHETERIZATION  03/04/2010   patent grafts, normal LV function, diffusely diseased RCA (Dr. Adora Fridge)  . CARDIAC CATHETERIZATION N/A 02/15/2016   Procedure: Left Heart Cath and Cors/Grafts Angiography;  Surgeon: Peter M Martinique, MD;  Location: [facility];  Service: Cardiovascular;  Laterality: N/A;  . CLUB FOOT RELEASE Bilateral    1st and 2nd toes  . CORONARY ARTERY BYPASS GRAFT  07/16/2007   LIMA to LAD, VG to ramus, VG to PDA   . CORONARY STENT INTERVENTION N/A 06/23/2019   Procedure: CORONARY STENT INTERVENTION;  Surgeon: Quay Burow  J, MD;  Location: [facility];  Service: Cardiovascular;  Laterality: N/A;  . FLEXIBLE BRONCHOSCOPY  07/16/2007  . HAMMERTOE RECONSTRUCTION WITH WEIL OSTEOTOMY Right 06/03/2015   Procedure: RIGHT SECOND HAMMERTOE CORRECTION WITH MT WEIL OSTEOTOMY;  Surgeon: Wylene Simmer, MD;  Location: [facility];  Service: Orthopedics;  Laterality: Right;  . HAND SURGERY Bilateral    1st and 2nd fingers - release of clubbing  . KNEE ARTHROSCOPY WITH ANTERIOR CRUCIATE LIGAMENT (ACL) REPAIR Right 01/30/2008  . LEFT HEART CATH AND CORS/GRAFTS ANGIOGRAPHY N/A 06/23/2019   Procedure: LEFT HEART CATH AND CORS/GRAFTS ANGIOGRAPHY;  Surgeon: Lorretta Harp, MD;  Location: [facility];  Service: Cardiovascular;  Laterality: N/A;  . LEFT HEART CATH AND CORS/GRAFTS ANGIOGRAPHY N/A 03/01/2020   Procedure: LEFT HEART CATH AND CORS/GRAFTS ANGIOGRAPHY;  Surgeon: Belva Crome, MD;  Location: [facility];  Service: Cardiovascular;  Laterality: N/A;  . NOVASURE ABLATION  12/19/2007   with removal of IUD  . ORIF FOOT FRACTURE Left   . TOE SURGERIES TO REMOVE NAILS DUE TO CLUBBING    . TOTAL KNEE ARTHROPLASTY Right 10/21/2012   Procedure: TOTAL KNEE ARTHROPLASTY;  Surgeon: Mauri Pole, MD;  Location: [facility];  Service: Orthopedics;  Laterality: Right;     reports that she quit smoking about 4 years ago. She smoked 0.00 packs per day for 0.00 years. She has never used smokeless tobacco. She reports that she does not drink alcohol and does not use drugs.  Allergies  Allergen Reactions  . Benadryl [Diphenhydramine Hcl] Shortness Of Breath and Swelling    Tongue swelling  . Codeine Other (See Comments)    "makes me psychotic"   . Haldol [Haloperidol Decanoate] Anaphylaxis  . Amoxicillin Hives and Swelling    Swelling to extremities Did it involve swelling of the face/tongue/throat, SOB, or low BP? Yes Did it involve sudden or severe rash/hives, skin peeling, or any reaction on the inside of your mouth or nose? Yes Did you need to seek medical attention at a hospital or doctor's office? Yes When did it last happen?young adult If all above answers are "NO", may proceed with cephalosporin use.  . Bee Venom Swelling and Other (See Comments)    "pimples" from head to toe and whole body swelling    Family History  Problem Relation Age of Onset  . Heart disease Father   . Anesthesia problems Father        states "did not do well" with anesthesia  . Lung cancer Mother      Prior to Admission medications   Medication Sig Start Date End Date Taking? Authorizing Provider  albuterol (PROVENTIL HFA;VENTOLIN HFA) 108 (90  BASE) MCG/ACT inhaler Inhale 2 puffs into the lungs every 6 (six) hours as needed for wheezing or shortness of breath.    Yes [provider]  Alirocumab (PRALUENT) 150 MG/ML SOAJ Inject 1 pen into the skin every 14 (fourteen) days. 09/22/19  Yes Lorretta Harp, MD  aspirin 81 MG chewable tablet Chew 81 mg by mouth daily.   Yes [provider]  atorvastatin (LIPITOR) 80 MG tablet Take 1 tablet (80 mg total) by mouth daily. Need to make an appt for overdue visit 06/30/19  Yes Belva Crome, MD  BREO ELLIPTA 100-25 MCG/INH AEPB Inhale 1 puff into the lungs daily.  07/29/19  Yes [provider]  brimonidine (ALPHAGAN) 0.15 % ophthalmic solution Place 1 drop into both eyes 2 (two)  times daily.    Yes [provider]  cholecalciferol (VITAMIN D3) 25 MCG (1000 UNIT) tablet Take 1,000 Units by mouth daily.   Yes [provider]  clopidogrel (PLAVIX) 75 MG tablet Take 1 tablet (75 mg total) by mouth daily with breakfast. 06/26/19  Yes Furth, Cadence H, PA-C  escitalopram (LEXAPRO) 10 MG tablet Take 10 mg by mouth daily. 05/31/19  Yes [provider]  fluticasone (FLONASE) 50 MCG/ACT nasal spray Place 1 spray into both nostrils daily. 06/13/19  Yes [provider]  isosorbide mononitrate (IMDUR) 60 MG 24 hr tablet Take 1 tablet (60 mg total) by mouth daily. 03/02/20 03/02/21 Yes Duke, Tami Lin, PA  lamoTRIgine (LAMICTAL) 150 MG tablet Take 150 mg by mouth daily.  08/05/19  Yes [provider]  metoprolol tartrate (LOPRESSOR) 25 MG tablet Take 0.5 tablets (12.5 mg total) by mouth 2 (two) times daily. 06/25/19  Yes Furth, Cadence H, PA-C  niacin (NIASPAN) 1000 MG CR tablet TAKE 1 TABLET (1,000 MG TOTAL) BY MOUTH AT BEDTIME. Patient taking differently: Take 1,000 mg by mouth at bedtime.  02/07/19  Yes Lorretta Harp, MD  nitroGLYCERIN (NITROSTAT) 0.4 MG SL tablet Place 1 tablet (0.4 mg total) under the tongue every 5 (five) minutes as  needed for chest pain. 07/03/19  Yes Kilroy, Luke K, PA-C  ondansetron (ZOFRAN-ODT) 8 MG disintegrating tablet Take 8 mg by mouth every 8 (eight) hours as needed for nausea or vomiting.  02/26/20  Yes [provider]  QUEtiapine (SEROQUEL) 200 MG tablet Take 125 mg by mouth at bedtime. Takes 1 1/4 tablet (125 mg) 05/31/19  Yes [provider]  VASCEPA 1 g capsule TAKE 2 CAPSULES (2 G TOTAL) BY MOUTH 2 (TWO) TIMES DAILY. 11/26/19  Yes Lorretta Harp, MD  vitamin B-12 (CYANOCOBALAMIN) 1000 MCG tablet Take 1,000 mcg by mouth daily.   Yes [provider]  QUEtiapine (SEROQUEL) 100 MG tablet Take 150 mg by mouth at bedtime. Patient not taking: Reported on 03/05/2020 02/23/20   [provider]    Physical Exam: Vitals:   03/05/20 2000 03/05/20 2040 03/05/20 2130 03/05/20 2246  BP: (!) 158/90 (!) 158/84 (!) 184/90 (!) 176/86  Pulse: 75 68 79 74  Resp: 20 16 18 18   Temp:    98.3 F (36.8 C)  TempSrc:    Oral  SpO2: 95% 94% 95% 95%      Constitutional: NAD, calm, comfortable Vitals:   03/05/20 2000 03/05/20 2040 03/05/20 2130 03/05/20 2246  BP: (!) 158/90 (!) 158/84 (!) 184/90 (!) 176/86  Pulse: 75 68 79 74  Resp: 20 16 18 18   Temp:    98.3 F (36.8 C)  TempSrc:    Oral  SpO2: 95% 94% 95% 95%   Eyes: PERRL, lids and conjunctivae normal ENMT: Mucous membranes are moist. Posterior pharynx clear of any exudate or lesions.Normal dentition.  Neck: normal, supple, no masses, no thyromegaly Respiratory: clear to auscultation bilaterally, no wheezing, no crackles. Normal respiratory effort. No accessory muscle use.  Cardiovascular: Regular rate and rhythm, no murmurs / rubs / gallops. No extremity edema. 2+ pedal pulses. No carotid bruits.  Abdomen: no tenderness, no masses palpated. No hepatosplenomegaly. Bowel sounds positive.  Musculoskeletal: no clubbing / cyanosis. No joint deformity upper and lower extremities. Good ROM, no contractures. Normal muscle  tone.  Skin: no rashes, lesions, ulcers. No induration Neurologic: CN 2-12 grossly intact. Sensation intact, DTR normal. Strength 5/5 in all 4.  Psychiatric: Normal judgment  and insight. Alert and oriented x 3. Normal mood.     Labs on Admission: I have personally reviewed following labs and imaging studies  CBC: Recent Labs  Lab 02/29/20 2025 03/01/20 0131 03/02/20 0229 03/05/20 1400  WBC 16.6* 12.1* 10.5 15.3*  HGB 13.9 12.1 11.8* 12.7  HCT 40.9 36.2 35.5* 39.0  MCV 93.8 95.0 95.4 97.3  PLT 301 282 259 932   Basic Metabolic Panel: Recent Labs  Lab 02/29/20 2042 03/01/20 0131 03/02/20 0229 03/05/20 1400  NA 138 136 139 139  K 3.4* 3.1* 3.4* 4.2  CL 100 100 105 103  CO2 23 25 24 22   GLUCOSE 144* 168* 110* 204*  BUN 23* 18 10 26*  CREATININE 1.13* 1.04* 0.93 0.97  CALCIUM 9.3 8.1* 8.7* 9.3  MG  --   --  1.8  --   PHOS  --   --  3.1  --    GFR: Estimated Creatinine Clearance: 69.3 mL/min (by C-G formula based on SCr of 0.97 mg/dL). Liver Function Tests: Recent Labs  Lab 02/29/20 2042 03/02/20 0229  AST 21 16  ALT 20 17  ALKPHOS 117 98  BILITOT 1.0 0.7  PROT 7.9 6.2*  ALBUMIN 4.2 3.2*   Recent Labs  Lab 02/29/20 2042  LIPASE 37   No results for input(s): AMMONIA in the last 168 hours. Coagulation Profile: Recent Labs  Lab 02/29/20 2225  INR 1.0   Cardiac Enzymes: No results for input(s): CKTOTAL, CKMB, CKMBINDEX, TROPONINI in the last 168 hours. BNP (last 3 results) No results for input(s): PROBNP in the last 8760 hours. HbA1C: No results for input(s): HGBA1C in the last 72 hours. CBG: No results for input(s): GLUCAP in the last 168 hours. Lipid Profile: No results for input(s): CHOL, HDL, LDLCALC, TRIG, CHOLHDL, LDLDIRECT in the last 72 hours. Thyroid Function Tests: No results for input(s): TSH, T4TOTAL, FREET4, T3FREE, THYROIDAB in the last 72 hours. Anemia Panel: No results for input(s): VITAMINB12, FOLATE, FERRITIN, TIBC, IRON,  RETICCTPCT in the last 72 hours. Urine analysis:    Component Value Date/Time   COLORURINE YELLOW 06/24/2014 1835   APPEARANCEUR CLOUDY (A) 06/24/2014 1835   LABSPEC 1.021 06/24/2014 1835   PHURINE 5.5 06/24/2014 1835   GLUCOSEU NEGATIVE 06/24/2014 1835   HGBUR SMALL (A) 06/24/2014 1835   BILIRUBINUR NEGATIVE 06/24/2014 1835   KETONESUR NEGATIVE 06/24/2014 1835   PROTEINUR NEGATIVE 06/24/2014 1835   UROBILINOGEN 0.2 06/24/2014 1835   NITRITE NEGATIVE 06/24/2014 1835   LEUKOCYTESUR NEGATIVE 06/24/2014 1835   Sepsis Labs: @LABRCNTIP (procalcitonin:4,lacticidven:4) ) Recent Results (from the past 240 hour(s))  SARS Coronavirus 2 by RT PCR (hospital order, performed in [place] hospital lab) Nasopharyngeal Nasopharyngeal Swab     Status: None   Collection Time: 02/29/20 11:13 PM   Specimen: Nasopharyngeal Swab  Result Value Ref Range Status   SARS Coronavirus 2 NEGATIVE NEGATIVE Final    Comment: (NOTE) SARS-CoV-2 target nucleic acids are NOT DETECTED.  The SARS-CoV-2 RNA is generally detectable in upper and lower respiratory specimens during the acute phase of infection. The lowest concentration of SARS-CoV-2 viral copies this assay can detect is 250 copies / mL. A negative result does not preclude SARS-CoV-2 infection and should not be used as the sole basis for treatment or other patient management decisions.  A negative result may occur with improper specimen collection / handling, submission of specimen other than nasopharyngeal swab, presence of viral mutation(s) within the areas targeted by this assay, and inadequate number of viral  copies (<250 copies / mL). A negative result must be combined with clinical observations, patient history, and epidemiological information.  Fact Sheet for Patients:   StrictlyIdeas.no  Fact Sheet for Healthcare Providers: BankingDealers.co.za  This test is not yet approved or  cleared by  the [place] FDA and has been authorized for detection and/or diagnosis of SARS-CoV-2 by FDA under an Emergency Use Authorization (EUA).  This EUA will remain in effect (meaning this test can be used) for the duration of the COVID-19 declaration under Section 564(b)(1) of the Act, 21 U.S.C. section 360bbb-3(b)(1), unless the authorization is terminated or revoked sooner.  Performed at [facility], [place] [address] [place], [place] [address]   MRSA PCR Screening     Status: None   Collection Time: 03/01/20  1:13 AM   Specimen: Nasal Mucosa; Nasopharyngeal  Result Value Ref Range Status   MRSA by PCR NEGATIVE NEGATIVE Final    Comment:        The GeneXpert MRSA Assay (FDA approved for NASAL specimens only), is one component of a comprehensive MRSA colonization surveillance program. It is not intended to diagnose MRSA infection nor to guide or monitor treatment for MRSA infections. Performed at [facility], [place] [address] [place], [place] [address]   SARS Coronavirus 2 by RT PCR (hospital order, performed in [facility] hospital lab) Nasopharyngeal Nasopharyngeal Swab     Status: None   Collection Time: 03/05/20  7:58 PM   Specimen: Nasopharyngeal Swab  Result Value Ref Range Status   SARS Coronavirus 2 NEGATIVE NEGATIVE Final    Comment: (NOTE) SARS-CoV-2 target nucleic acids are NOT DETECTED.  The SARS-CoV-2 RNA is generally detectable in upper and lower respiratory specimens during the acute phase of infection. The lowest concentration of SARS-CoV-2 viral copies this assay can detect is 250 copies / mL. A negative result does not preclude SARS-CoV-2 infection and should not be used as the sole basis for treatment or other patient management decisions.  A negative result may occur with improper specimen collection / handling, submission of specimen other than nasopharyngeal swab, presence of viral mutation(s) within the areas  targeted by this assay, and inadequate number of viral copies (<250 copies / mL). A negative result must be combined with clinical observations, patient history, and epidemiological information.  Fact Sheet for Patients:   StrictlyIdeas.no  Fact Sheet for Healthcare Providers: BankingDealers.co.za  This test is not yet approved or  cleared by the [place] FDA and has been authorized for detection and/or diagnosis of SARS-CoV-2 by FDA under an Emergency Use Authorization (EUA).  This EUA will remain in effect (meaning this test can be used) for the duration of the COVID-19 declaration under Section 564(b)(1) of the Act, 21 U.S.C. section 360bbb-3(b)(1), unless the authorization is terminated or revoked sooner.  Performed at [facility], [place] [address], [place],  [address]      Radiological Exams on Admission: DG Chest 2 View  Result Date: 03/05/2020 CLINICAL DATA:  Chest pain. EXAM: CHEST - 2 VIEW COMPARISON:  February 29, 2020. FINDINGS: The heart size and mediastinal contours are within normal limits. Both lungs are clear. No pneumothorax or pleural effusion is noted. Status post coronary bypass graft. The visualized skeletal structures are unremarkable. IMPRESSION: No active cardiopulmonary disease. Electronically Signed   By: Marijo Conception M.D.   On: 03/05/2020 14:21    EKG: Independently reviewed.  Normal sinus rhythm with no significant ST changes.  Assessment/Plan  Principal Problem:   Cardiac chest pain Active Problems:   S/P right TKA   Hyperlipidemia LDL goal <70   Essential hypertension   Unstable angina (HCC)   CAD- S/P PCI   Hx of CABG     #1 persistent chest pain: Patient will be admitted to stepdown.  Continue nitroglycerin drip morphine as well as close monitoring.  Cardiology will be consulted in the morning.  Continue other cardio medications.  #2 hyperlipidemia: Continue statin   #3 hypertension: Continue blood pressure control  #4 status post right TKA: Appears stable.  Continue to monitor   DVT prophylaxis: Lovenox Code Status: Full code Family Communication: Bedside Disposition Plan: Home Consults called: Cardiology consult in the morning Admission status: Inpatient  Severity of Illness: The appropriate patient status for this patient is INPATIENT. Inpatient status is judged to be reasonable and necessary in order to provide the required intensity of service to ensure the patient's safety. The patient's presenting symptoms, physical exam findings, and initial radiographic and laboratory data in the context of their chronic comorbidities is felt to place them at high risk for further clinical deterioration. Furthermore, it is not anticipated that the patient will be medically stable for discharge from the hospital within 2 midnights of admission. The following factors support the patient status of inpatient.   " The patient's presenting symptoms include chest pain. " The worrisome physical exam findings include no acute findings. " The initial radiographic and laboratory data are worrisome because of normal EKG. " The chronic co-morbidities include coronary artery disease.   * I certify that at the point of admission it is my clinical judgment that the patient will require inpatient hospital care spanning beyond 2 midnights from the point of admission due to high intensity of service, high risk for further deterioration and high frequency of surveillance required.Barbette Merino MD [facility] Pager [phone]  If 7PM-7AM, please contact night-coverage [email or web address] Password [facility]  03/05/2020, 11:45 PM

## 2020-03-05 NOTE — ED Triage Notes (Signed)
Patient arrives to ED with complaints of chest pain, nausea, and vomiting since she woke up this morning states it feels like her last admission when she was dx with NSTEMI. Patient anxious and mildly SOB in triage. Had cardiac cath done on 7/12.

## 2020-03-06 DIAGNOSIS — Z9861 Coronary angioplasty status: Secondary | ICD-10-CM

## 2020-03-06 DIAGNOSIS — I10 Essential (primary) hypertension: Secondary | ICD-10-CM

## 2020-03-06 DIAGNOSIS — E785 Hyperlipidemia, unspecified: Secondary | ICD-10-CM

## 2020-03-06 DIAGNOSIS — I2510 Atherosclerotic heart disease of native coronary artery without angina pectoris: Secondary | ICD-10-CM

## 2020-03-06 LAB — HEPARIN LEVEL (UNFRACTIONATED)
Heparin Unfractionated: 0.42 IU/mL (ref 0.30–0.70)
Heparin Unfractionated: 0.66 IU/mL (ref 0.30–0.70)
Heparin Unfractionated: 0.55 IU/mL (ref 0.30–0.70)

## 2020-03-06 LAB — CBC
HCT: 33.8 % — ABNORMAL LOW (ref 36.0–46.0)
Hemoglobin: 11.2 g/dL — ABNORMAL LOW (ref 12.0–15.0)
MCH: 31.9 pg (ref 26.0–34.0)
MCHC: 33.1 g/dL (ref 30.0–36.0)
MCV: 96.3 fL (ref 80.0–100.0)
Platelets: 264 10*3/uL (ref 150–400)
RBC: 3.51 MIL/uL — ABNORMAL LOW (ref 3.87–5.11)
RDW: 15.7 % — ABNORMAL HIGH (ref 11.5–15.5)
WBC: 13.2 10*3/uL — ABNORMAL HIGH (ref 4.0–10.5)
nRBC: 0 % (ref 0.0–0.2)

## 2020-03-06 LAB — MRSA PCR SCREENING: MRSA by PCR: NEGATIVE

## 2020-03-06 MED ORDER — ISOSORBIDE MONONITRATE ER 60 MG PO TB24: 60.0000 mg | ORAL_TABLET | Freq: Every day | ORAL | Status: DC

## 2020-03-06 MED ORDER — FLUTICASONE FUROATE-VILANTEROL 100-25 MCG/INH IN AEPB
1.0000 | INHALATION_SPRAY | Freq: Every day | RESPIRATORY_TRACT | Status: DC
  Administered 2020-03-06 – 2020-03-07 (×2): 1 via RESPIRATORY_TRACT
  Filled 2020-03-06: qty 28

## 2020-03-06 MED ORDER — ALUM & MAG HYDROXIDE-SIMETH 200-200-20 MG/5ML PO SUSP: 30.0000 mL | Freq: Once | ORAL | Status: DC

## 2020-03-06 MED ORDER — BRIMONIDINE TARTRATE 0.15 % OP SOLN
1.0000 [drp] | Freq: Two times a day (BID) | OPHTHALMIC | Status: DC
  Administered 2020-03-06 – 2020-03-07 (×3): 1 [drp] via OPHTHALMIC
  Filled 2020-03-06: qty 5

## 2020-03-06 MED ORDER — ISOSORBIDE MONONITRATE ER 60 MG PO TB24
90.0000 mg | ORAL_TABLET | Freq: Every day | ORAL | Status: DC
  Administered 2020-03-06 – 2020-03-07 (×2): 90 mg via ORAL
  Filled 2020-03-06 (×2): qty 2

## 2020-03-06 MED ORDER — FLUTICASONE PROPIONATE 50 MCG/ACT NA SUSP
1.0000 | Freq: Every day | NASAL | Status: DC
  Filled 2020-03-06: qty 16

## 2020-03-06 MED ORDER — LIDOCAINE VISCOUS HCL 2 % MT SOLN: 15.0000 mL | Freq: Once | OROMUCOSAL | Status: DC

## 2020-03-06 MED ORDER — NIACIN ER (ANTIHYPERLIPIDEMIC) 500 MG PO TBCR
1000.0000 mg | EXTENDED_RELEASE_TABLET | Freq: Every day | ORAL | Status: DC
  Administered 2020-03-06 (×2): 1000 mg via ORAL
  Filled 2020-03-06 (×2): qty 2

## 2020-03-06 MED ORDER — ESCITALOPRAM OXALATE 20 MG PO TABS
10.0000 mg | ORAL_TABLET | Freq: Every day | ORAL | Status: DC
  Administered 2020-03-06 – 2020-03-07 (×2): 10 mg via ORAL
  Filled 2020-03-06 (×2): qty 1

## 2020-03-06 MED ORDER — VITAMIN B-12 1000 MCG PO TABS
1000.0000 ug | ORAL_TABLET | Freq: Every day | ORAL | Status: DC
  Administered 2020-03-06 – 2020-03-07 (×2): 1000 ug via ORAL
  Filled 2020-03-06 (×2): qty 1

## 2020-03-06 MED ORDER — ALBUTEROL SULFATE HFA 108 (90 BASE) MCG/ACT IN AERS: 2.0000 | INHALATION_SPRAY | Freq: Four times a day (QID) | RESPIRATORY_TRACT | Status: DC | PRN

## 2020-03-06 MED ORDER — ALBUTEROL SULFATE (2.5 MG/3ML) 0.083% IN NEBU: 2.5000 mg | INHALATION_SOLUTION | Freq: Four times a day (QID) | RESPIRATORY_TRACT | Status: DC | PRN

## 2020-03-06 MED ORDER — ASPIRIN 81 MG PO CHEW
81.0000 mg | CHEWABLE_TABLET | Freq: Every day | ORAL | Status: DC
  Administered 2020-03-06 – 2020-03-07 (×2): 81 mg via ORAL
  Filled 2020-03-06 (×2): qty 1

## 2020-03-06 MED ORDER — ONDANSETRON 4 MG PO TBDP: 8.0000 mg | ORAL_TABLET | Freq: Three times a day (TID) | ORAL | Status: DC | PRN

## 2020-03-06 MED ORDER — METOPROLOL TARTRATE 12.5 MG HALF TABLET
12.5000 mg | ORAL_TABLET | Freq: Two times a day (BID) | ORAL | Status: DC
  Administered 2020-03-06 – 2020-03-07 (×4): 12.5 mg via ORAL
  Filled 2020-03-06 (×4): qty 1

## 2020-03-06 MED ORDER — ATORVASTATIN CALCIUM 40 MG PO TABS
80.0000 mg | ORAL_TABLET | Freq: Every day | ORAL | Status: DC
  Administered 2020-03-06 – 2020-03-07 (×2): 80 mg via ORAL
  Filled 2020-03-06 (×2): qty 2

## 2020-03-06 MED ORDER — QUETIAPINE FUMARATE 50 MG PO TABS
125.0000 mg | ORAL_TABLET | Freq: Every day | ORAL | Status: DC
  Administered 2020-03-06 (×2): 125 mg via ORAL
  Filled 2020-03-06 (×2): qty 1

## 2020-03-06 MED ORDER — CLOPIDOGREL BISULFATE 75 MG PO TABS
75.0000 mg | ORAL_TABLET | Freq: Every day | ORAL | Status: DC
  Administered 2020-03-06 – 2020-03-07 (×2): 75 mg via ORAL
  Filled 2020-03-06 (×2): qty 1

## 2020-03-06 MED ORDER — NITROGLYCERIN 0.4 MG SL SUBL: 0.4000 mg | SUBLINGUAL_TABLET | SUBLINGUAL | Status: DC | PRN

## 2020-03-06 MED ORDER — CHLORHEXIDINE GLUCONATE CLOTH 2 % EX PADS
6.0000 | MEDICATED_PAD | Freq: Every day | CUTANEOUS | Status: DC
  Administered 2020-03-06: 6 via TOPICAL

## 2020-03-06 MED ORDER — RANOLAZINE ER 500 MG PO TB12
500.0000 mg | ORAL_TABLET | Freq: Two times a day (BID) | ORAL | Status: DC
  Administered 2020-03-06 – 2020-03-07 (×3): 500 mg via ORAL
  Filled 2020-03-06 (×3): qty 1

## 2020-03-06 MED ORDER — VITAMIN D 25 MCG (1000 UNIT) PO TABS
1000.0000 [IU] | ORAL_TABLET | Freq: Every day | ORAL | Status: DC
  Administered 2020-03-06 – 2020-03-07 (×2): 1000 [IU] via ORAL
  Filled 2020-03-06 (×2): qty 1

## 2020-03-06 MED ORDER — ALIROCUMAB 150 MG/ML ~~LOC~~ SOAJ: 1.0000 "pen " | SUBCUTANEOUS | Status: DC

## 2020-03-06 MED ORDER — ALIROCUMAB 150 MG/ML ~~LOC~~ SOAJ: 150.0000 ug | SUBCUTANEOUS | Status: DC

## 2020-03-06 MED ORDER — LAMOTRIGINE 25 MG PO TABS
150.0000 mg | ORAL_TABLET | Freq: Every day | ORAL | Status: DC
  Administered 2020-03-06 – 2020-03-07 (×2): 150 mg via ORAL
  Filled 2020-03-06 (×2): qty 1

## 2020-03-06 NOTE — Progress Notes (Addendum)
PROGRESS NOTE    [name]  IRJ:[id number] DOB: 1961/03/16 DOA: 03/05/2020 PCP: Katherina Mires, MD   Brief Narrative:  [name] is a 59 y.o. female with medical history significant of inoperable coronary artery disease hypertension hyperlipidemia CABG, status post right TKA, morbid obesity, who was recently admitted to the hospital and was discharged with medical management of angina.  Patient was seen by cardiology at the time and was told she has two-vessel disease with 100% occlusion of one of them.  She came back today secondary to worsening chest pain at home unable to function.  She took off her medicines today but vomited.  Her pain is rated as 7-9 out of 10.  Also some nausea and some vomiting patient has been initiated on IV nitroglycerin morphine and other supportive medications.  She is being admitted with persistent ongoing angina chest pain. In ED: Temperature 98.7 blood pressure 117/89 pulse 79 respirate of 32 oxygen sat 91% on room air.  White count 15.3 BUN 26 otherwise rest of CBC and chemistry within normal.  Chest x-ray is normal EKG showed no acute findings.  Patient being admitted with intractable chest pain secondary to known inoperable angina    Assessment & Plan:   Principal Problem:   Cardiac chest pain Active Problems:   S/P right TKA   Hyperlipidemia LDL goal <70   Essential hypertension   Unstable angina (HCC)   CAD- S/P PCI   Hx of CABG   Persistent chest pain in the setting of nonobstructive disease not amenable to intervention, POA - Cardiology following , appreciate insight and recommendations  - Currently on nitro drip, morphine and heparin drip this morning at evaluation  - Agree with cardiology this may not be cardiac in etiology, will attempt GI cocktail x1 and follow symptoms to rule out any reflux or gastric pain, consider muscle relaxers and NSAIDs later today if no improvement with GI cocktail to further evaluate for  musculoskeletal etiology.   -Cardiology to adjust medications including Imdur and Ranexa for improved symptom management  Leukemoid reaction -Likely in the setting of above, continue to follow clinically no overt signs or symptoms of infection at this time.  Hyperlipidemia: Continue statin  Hypertension: Continue blood pressure control  Status post right TKA: Appears stable.  Continue to monitor   DVT prophylaxis: Heparin gtt Code Status: Full code Family Communication: Bedside Disposition Plan: Home Consults called: Cardiology  Status is: Inpatient  Dispo: The patient is from: Home              Anticipated d/c is to: Home              Anticipated d/c date is: 24 to 48 hours              Patient currently not medically stable for discharge given need for ongoing cardiac evaluation, IV medications and medication adjustment in the setting of chest pain.  Procedures:   None planned  Antimicrobials:  None indicated  Subjective: Chest pain nausea vomiting markedly improved overnight with treatment, indicates scant midsternal stabbing chest pain but otherwise denies nausea, vomiting, diarrhea, constipation, headache, fevers, chills.  Objective: Vitals:   03/06/20 0400 03/06/20 0500 03/06/20 0510 03/06/20 0600  BP: (!) 102/52 (!) 85/36 106/61 132/68  Pulse: 61 (!) 57 (!) 57 (!) 57  Resp: 17 13 16 15   Temp:      TempSrc:      SpO2: 96% 97% 100% 99%  Weight:  Height:        Intake/Output Summary (Last 24 hours) at 03/06/2020 0708 Last data filed at 03/06/2020 0600 Gross per 24 hour  Intake 226.13 ml  Output 200 ml  Net 26.13 ml   Filed Weights   03/06/20 0147  Weight: 89 kg    Examination:  General exam: Appears calm and comfortable  Respiratory system: Clear to auscultation. Respiratory effort normal. Cardiovascular system: S1 & S2 heard, RRR. No JVD, murmurs, rubs, gallops or clicks. No pedal edema. Gastrointestinal system: Abdomen is nondistended,  soft and nontender. No organomegaly or masses felt. Normal bowel sounds heard. Central nervous system: Alert and oriented. No focal neurological deficits. Extremities: Symmetric 5 x 5 power. Skin: No rashes, lesions or ulcers Psychiatry: Judgement and insight appear normal. Mood & affect appropriate.     Data Reviewed: I have personally reviewed following labs and imaging studies  CBC: Recent Labs  Lab 02/29/20 2025 03/01/20 0131 03/02/20 0229 03/05/20 1400 03/06/20 0239  WBC 16.6* 12.1* 10.5 15.3* 13.2*  HGB 13.9 12.1 11.8* 12.7 11.2*  HCT 40.9 36.2 35.5* 39.0 33.8*  MCV 93.8 95.0 95.4 97.3 96.3  PLT 301 282 259 283 948   Basic Metabolic Panel: Recent Labs  Lab 02/29/20 2042 03/01/20 0131 03/02/20 0229 03/05/20 1400  NA 138 136 139 139  K 3.4* 3.1* 3.4* 4.2  CL 100 100 105 103  CO2 23 25 24 22   GLUCOSE 144* 168* 110* 204*  BUN 23* 18 10 26*  CREATININE 1.13* 1.04* 0.93 0.97  CALCIUM 9.3 8.1* 8.7* 9.3  MG  --   --  1.8  --   PHOS  --   --  3.1  --    GFR: Estimated Creatinine Clearance: 69.7 mL/min (by C-G formula based on SCr of 0.97 mg/dL). Liver Function Tests: Recent Labs  Lab 02/29/20 2042 03/02/20 0229  AST 21 16  ALT 20 17  ALKPHOS 117 98  BILITOT 1.0 0.7  PROT 7.9 6.2*  ALBUMIN 4.2 3.2*   Recent Labs  Lab 02/29/20 2042  LIPASE 37   No results for input(s): AMMONIA in the last 168 hours. Coagulation Profile: Recent Labs  Lab 02/29/20 2225  INR 1.0   Cardiac Enzymes: No results for input(s): CKTOTAL, CKMB, CKMBINDEX, TROPONINI in the last 168 hours. BNP (last 3 results) No results for input(s): PROBNP in the last 8760 hours. HbA1C: No results for input(s): HGBA1C in the last 72 hours. CBG: No results for input(s): GLUCAP in the last 168 hours. Lipid Profile: No results for input(s): CHOL, HDL, LDLCALC, TRIG, CHOLHDL, LDLDIRECT in the last 72 hours. Thyroid Function Tests: No results for input(s): TSH, T4TOTAL, FREET4, T3FREE,  THYROIDAB in the last 72 hours. Anemia Panel: No results for input(s): VITAMINB12, FOLATE, FERRITIN, TIBC, IRON, RETICCTPCT in the last 72 hours. Sepsis Labs: No results for input(s): PROCALCITON, LATICACIDVEN in the last 168 hours.  Recent Results (from the past 240 hour(s))  SARS Coronavirus 2 by RT PCR (hospital order, performed in [facility] hospital lab) Nasopharyngeal Nasopharyngeal Swab     Status: None   Collection Time: 02/29/20 11:13 PM   Specimen: Nasopharyngeal Swab  Result Value Ref Range Status   SARS Coronavirus 2 NEGATIVE NEGATIVE Final    Comment: (NOTE) SARS-CoV-2 target nucleic acids are NOT DETECTED.  The SARS-CoV-2 RNA is generally detectable in upper and lower respiratory specimens during the acute phase of infection. The lowest concentration of SARS-CoV-2 viral copies this assay can detect is 250 copies /  mL. A negative result does not preclude SARS-CoV-2 infection and should not be used as the sole basis for treatment or other patient management decisions.  A negative result may occur with improper specimen collection / handling, submission of specimen other than nasopharyngeal swab, presence of viral mutation(s) within the areas targeted by this assay, and inadequate number of viral copies (<250 copies / mL). A negative result must be combined with clinical observations, patient history, and epidemiological information.  Fact Sheet for Patients:   StrictlyIdeas.no  Fact Sheet for Healthcare Providers: BankingDealers.co.za  This test is not yet approved or  cleared by the [place] FDA and has been authorized for detection and/or diagnosis of SARS-CoV-2 by FDA under an Emergency Use Authorization (EUA).  This EUA will remain in effect (meaning this test can be used) for the duration of the COVID-19 declaration under Section 564(b)(1) of the Act, 21 U.S.C. section 360bbb-3(b)(1), unless the authorization  is terminated or revoked sooner.  Performed at [facility], [place] [address] [place], [place] [address]   MRSA PCR Screening     Status: None   Collection Time: 03/01/20  1:13 AM   Specimen: Nasal Mucosa; Nasopharyngeal  Result Value Ref Range Status   MRSA by PCR NEGATIVE NEGATIVE Final    Comment:        The GeneXpert MRSA Assay (FDA approved for NASAL specimens only), is one component of a comprehensive MRSA colonization surveillance program. It is not intended to diagnose MRSA infection nor to guide or monitor treatment for MRSA infections. Performed at [facility], [place] [address], [place], [place] [address]   SARS Coronavirus 2 by RT PCR (hospital order, performed in [facility] hospital lab) Nasopharyngeal Nasopharyngeal Swab     Status: None   Collection Time: 03/05/20  7:58 PM   Specimen: Nasopharyngeal Swab  Result Value Ref Range Status   SARS Coronavirus 2 NEGATIVE NEGATIVE Final    Comment: (NOTE) SARS-CoV-2 target nucleic acids are NOT DETECTED.  The SARS-CoV-2 RNA is generally detectable in upper and lower respiratory specimens during the acute phase of infection. The lowest concentration of SARS-CoV-2 viral copies this assay can detect is 250 copies / mL. A negative result does not preclude SARS-CoV-2 infection and should not be used as the sole basis for treatment or other patient management decisions.  A negative result may occur with improper specimen collection / handling, submission of specimen other than nasopharyngeal swab, presence of viral mutation(s) within the areas targeted by this assay, and inadequate number of viral copies (<250 copies / mL). A negative result must be combined with clinical observations, patient history, and epidemiological information.  Fact Sheet for Patients:   StrictlyIdeas.no  Fact Sheet for Healthcare Providers: BankingDealers.co.za   This test is not yet approved or  cleared by the [place] FDA and has been authorized for detection and/or diagnosis of SARS-CoV-2 by FDA under an Emergency Use Authorization (EUA).  This EUA will remain in effect (meaning this test can be used) for the duration of the COVID-19 declaration under Section 564(b)(1) of the Act, 21 U.S.C. section 360bbb-3(b)(1), unless the authorization is terminated or revoked sooner.  Performed at [facility], [place] [address] [place],  [address]   MRSA PCR Screening     Status: None   Collection Time: 03/06/20 12:18 AM   Specimen: Nasal Mucosa; Nasopharyngeal  Result Value Ref Range Status   MRSA by PCR NEGATIVE NEGATIVE Final    Comment:  The GeneXpert MRSA Assay (FDA approved for NASAL specimens only), is one component of a comprehensive MRSA colonization surveillance program. It is not intended to diagnose MRSA infection nor to guide or monitor treatment for MRSA infections. Performed at [facility], [place] [address], [place], [place] [address]          Radiology Studies: DG Chest 2 View  Result Date: 03/05/2020 CLINICAL DATA:  Chest pain. EXAM: CHEST - 2 VIEW COMPARISON:  February 29, 2020. FINDINGS: The heart size and mediastinal contours are within normal limits. Both lungs are clear. No pneumothorax or pleural effusion is noted. Status post coronary bypass graft. The visualized skeletal structures are unremarkable. IMPRESSION: No active cardiopulmonary disease. Electronically Signed   By: Marijo Conception M.D.   On: 03/05/2020 14:21        Scheduled Meds: . [START ON 03/14/2020] Alirocumab  150 mcg Subcutaneous Q14 Days  . aspirin  81 mg Oral Daily  . atorvastatin  80 mg Oral Daily  . brimonidine  1 drop Both Eyes BID  . Chlorhexidine Gluconate Cloth  6 each Topical Q0600  . cholecalciferol  1,000 Units Oral Daily  . clopidogrel  75 mg Oral Q breakfast  . escitalopram  10 mg  Oral Daily  . fluticasone  1 spray Each Nare Daily  . fluticasone furoate-vilanterol  1 puff Inhalation Daily  . isosorbide mononitrate  60 mg Oral Daily  . lamoTRIgine  150 mg Oral Daily  . metoprolol tartrate  12.5 mg Oral BID  . niacin  1,000 mg Oral QHS  . QUEtiapine  125 mg Oral QHS  . vitamin B-12  1,000 mcg Oral Daily   Continuous Infusions: . heparin 900 Units/hr (03/06/20 0600)  . nitroGLYCERIN Stopped (03/06/20 0500)     LOS: 1 day   Time spent: 73min  William C Lancaster, DO [facility]  If 7PM-7AM, please contact night-coverage [email or web address]  03/06/2020, 7:08 AM

## 2020-03-06 NOTE — Progress Notes (Signed)
Brief Pharmacy Consult Note - IV Heparin  Labs: heparin level 0.55  A/P: heparin level therapeutic (goal 0.3-0.7) on current IV heparin rate of 900 units/hr. NO reported bleeding. Continue current rate. Daily heparin level  Adrian Saran, PharmD, BCPS 03/06/2020 5:42 PM

## 2020-03-06 NOTE — Progress Notes (Signed)
ANTICOAGULATION CONSULT NOTE - Follow Up Consult  Pharmacy Consult for Heparin Indication: chest pain/ACS  Allergies  Allergen Reactions  . Benadryl [Diphenhydramine Hcl] Shortness Of Breath and Swelling    Tongue swelling  . Codeine Other (See Comments)    "makes me psychotic"   . Haldol [Haloperidol Decanoate] Anaphylaxis  . Amoxicillin Hives and Swelling    Swelling to extremities Did it involve swelling of the face/tongue/throat, SOB, or low BP? Yes Did it involve sudden or severe rash/hives, skin peeling, or any reaction on the inside of your mouth or nose? Yes Did you need to seek medical attention at a hospital or doctor's office? Yes When did it last happen?young adult If all above answers are "NO", may proceed with cephalosporin use.  . Bee Venom Swelling and Other (See Comments)    "pimples" from head to toe and whole body swelling    Patient Measurements: Height: 5\' 5"  (165.1 cm) Weight: 89 kg (196 lb 3.4 oz) IBW/kg (Calculated) : 57 Heparin Dosing Weight:   Vital Signs: Temp: 98.7 F (37.1 C) (07/17 0344) Temp Source: Axillary (07/17 0344) BP: 106/61 (07/17 0510) Pulse Rate: 57 (07/17 0510)  Labs: Recent Labs    03/05/20 1400 03/05/20 1720 03/06/20 0239  HGB 12.7  --  11.2*  HCT 39.0  --  33.8*  PLT 283  --  264  HEPARINUNFRC  --   --  0.42  CREATININE 0.97  --   --   TROPONINIHS 23* 68*  --     Estimated Creatinine Clearance: 69.7 mL/min (by C-G formula based on SCr of 0.97 mg/dL).   Medications:  Infusions:  . heparin 900 Units/hr (03/06/20 0500)  . nitroGLYCERIN Stopped (03/06/20 0500)    Assessment: Patient with heparin level at goal.  No heparin issues per RN.  Goal of Therapy:  Heparin level 0.3-0.7 units/ml Monitor platelets by anticoagulation protocol: Yes   Plan:  Continue heparin drip at current rate Recheck level at 0900  Tyler Deis, [place] 03/06/2020,6:22 AM

## 2020-03-06 NOTE — Progress Notes (Signed)
[place] for heparin Indication:ACS/STEMI  Allergies  Allergen Reactions  . Benadryl [Diphenhydramine Hcl] Shortness Of Breath and Swelling    Tongue swelling  . Codeine Other (See Comments)    "makes me psychotic"   . Haldol [Haloperidol Decanoate] Anaphylaxis  . Amoxicillin Hives and Swelling    Swelling to extremities Did it involve swelling of the face/tongue/throat, SOB, or low BP? Yes Did it involve sudden or severe rash/hives, skin peeling, or any reaction on the inside of your mouth or nose? Yes Did you need to seek medical attention at a hospital or doctor's office? Yes When did it last happen?young adult If all above answers are "NO", may proceed with cephalosporin use.  . Bee Venom Swelling and Other (See Comments)    "pimples" from head to toe and whole body swelling   Patient Measurements: Height: 5\' 5"  (165.1 cm) Weight: 89 kg (196 lb 3.4 oz) IBW/kg (Calculated) : 57 Heparin Dosing Weight: 76 kg  Vital Signs: Temp: 98 F (36.7 C) (07/17 0835) Temp Source: Oral (07/17 0835) BP: 151/92 (07/17 0800) Pulse Rate: 63 (07/17 0800)  Labs: Recent Labs    03/05/20 1400 03/05/20 1720 03/06/20 0239 03/06/20 0837  HGB 12.7  --  11.2*  --   HCT 39.0  --  33.8*  --   PLT 283  --  264  --   HEPARINUNFRC  --   --  0.42 0.66  CREATININE 0.97  --   --   --   TROPONINIHS 23* 68*  --   --    Estimated Creatinine Clearance: 69.7 mL/min (by C-G formula based on SCr of 0.97 mg/dL).  Medical History: Past Medical History:  Diagnosis Date  . Anemia    takes iron supplement  . Arthritis    knees, shoulders  . Bipolar 1 disorder ([place])   . Cataract, immature   . CHF (congestive heart failure) ([place])   . COPD (chronic obstructive pulmonary disease) ([place])   . Coronary artery disease    triple vessel  . Family history of adverse reaction to anesthesia    states father "did not do well" with anesthesia; died last time he had  surgery; also had heart disease  . Gallstones   . GERD (gastroesophageal reflux disease)    occasional - no current med.  . Glaucoma   . Hammertoe 05/2015   right second toe  . Hepatic steatosis   . High cholesterol   . History of MI (myocardial infarction)   . Hypertension    states under control with meds., has been on med. x 8 yr.  . Panic attacks   . Peripheral vascular disease ([place])    carotid  . Renal cyst, left   . Schizophrenia ([place])   . Short of breath on exertion   . Sleep apnea    no CPAP use  . Urinary, incontinence, stress female   . Wears dentures    upper  . Wears partial dentures    lower   Assessment: 59 yo F to start heparin per pharmacy for chest pain/ACS/STEMI.  She was just in the hospital 03/01/20 and pharmacy dosed heparin for ACS then. CBC WNL. SCr WNL.  No anticoagulants PTA.  No bleeding PTA.  - ASA 324mg  x1, ASA 81mg  daily, Plavix continued  Heparin 4000 unit bolus, infusion at 900 units/hr  Today, 03/06/2020 0240 Hep level = 0.42 units/ml 0830 Hep level = 0.66 units/ml, remains in therapeutic range  Goal  of Therapy:  Heparin level 0.3-0.7 units/ml Monitor platelets by anticoagulation protocol: Yes   Plan:  Continue Heparin at 900 units/hr Daily CBC, daily Hep level Recheck Hep level at 1700 today  Minda Ditto PharmD 03/06/2020, 10:26 AM

## 2020-03-06 NOTE — Consult Note (Signed)
Cardiology Consultation:   Patient ID: [name] MRN: [id number]; DOB: 1960/12/02  Admit date: 03/05/2020 Date of Consult: 03/06/2020  Primary Care Provider: Katherina Mires, MD [facility] Cardiologist: Quay Burow, MD  [facility] Electrophysiologist:  None    Patient Profile:   [name] is a 59 y.o. female with a hx of CAD s/p CABG x 3, obesity, current smoker, HTN, HLD, OSA intolerant to CPAP, and COPD who is being seen today for the evaluation of chest pain at the request of Dr. Avon Gully.  History of Present Illness:   [name] has a complex cardiac history with prior stenting of the RCA by Dr. Doylene Canard. She underwent CABG x 3 in 2008 (LIMA-LAD, SVG-ramus, SVG-PDA). Heart cath 02/2010 with patent grafts and normal EF. Myoview 2012 was nonischemic. Heart cath 2017 with patent grafts and unchanged anatomy from 2011 angiography. Heart cath 06/2019 with high-grade distal SVG-PDA stenosis treated with DES. Residual SVG-RI with 50% narrowing and mSVG-PDA with 5% narrowing. EF was normal. Echo did show mild diastolic dysfunction. She was last seen in clinic with Dr. Gwenlyn Found 08/26/19 and was feeling well at that time. 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  She presented to [facility] 01/26/20 via EMS for worsening chest pain but left prior to being seen. Of note, hs troponin was 13 and EKG nonischemic. Pt became angry after being asked to wait in the lobby. She called our office and was advised to wait to be seen in the ER. Pt eventually left.   She presented back to [facility] 02/29/20 with abdominal discomfort for the past several months, worse after eating. Symptoms worsened over the past several days with pain now radiating to her chest, shoulders, jaw, and arms. Of note, she reports being diagnosed with gastritis 3 days PTA. She was admitted and cardiology consulted. HS troponin 103 --> 84 --> 113.  EKG sinus rhythm, Q waves inferior and  anterolatearl leads (old), RBBB (old). She underwent cath showing patent LIMA>LAD, diffusely dz but patent SVG>IM, occluded mid and distal SVG to PDA with L>R collaterals supplying LV and PDA, diffusely disease LM but patent, occluded LAD after D1, patent LCx and OM with 60-70% ostial and mid vessel stenosis unchanged from prior study and EF 40% with normal LVEDP.  Medical management was recommended.  Her nitrates were increased to 60mg  daily.  She was also treated for possible GERD.   She called the office yesterday with recurrent sx of abdominal pain, vomiting, bloating, chest pain and SOB.  She went to the ER and was very anxious.  She did not want to wait and refused blood draws and left and went to [facility] ER.  On arrival there was complaining of lower abdominal pain that radiates into her epigastric area and neck. hsTrop elevated but lower than recent admission (peaked at 113 a few days ago and now only 23>68). She has been admitted to the [facility] service.  EKG showed no acute ST changes with  Inferior and anterior infarct, LVH and nonspecific T wave abnormality.  Cards is now asked to consult.     Past Medical History:  Diagnosis Date  . Anemia    takes iron supplement  . Arthritis    knees, shoulders  . Bipolar 1 disorder ([place])   . Cataract, immature   . CHF (congestive heart failure) ([place])   . COPD (chronic obstructive pulmonary disease) ([place])   . Coronary artery disease    triple vessel  .  Family history of adverse reaction to anesthesia    states father "did not do well" with anesthesia; died last time he had surgery; also had heart disease  . Gallstones   . GERD (gastroesophageal reflux disease)    occasional - no current med.  . Glaucoma   . Hammertoe 05/2015   right second toe  . Hepatic steatosis   . High cholesterol   . History of MI (myocardial infarction)   . Hypertension    states under control with meds., has been on med. x 8 yr.  . Panic attacks   . Peripheral vascular  disease ([place])    carotid  . Renal cyst, left   . Schizophrenia ([place])   . Short of breath on exertion   . Sleep apnea    no CPAP use  . Urinary, incontinence, stress female   . Wears dentures    upper  . Wears partial dentures    lower    Past Surgical History:  Procedure Laterality Date  . C-SECTIONS X 2  1985, 1988  . CARDIAC CATHETERIZATION  12/10/2006; 12/18/2006; 07/10/2007; 07/19/2009  . CARDIAC CATHETERIZATION  03/04/2010   patent grafts, normal LV function, diffusely diseased RCA (Dr. Adora Fridge)  . CARDIAC CATHETERIZATION N/A 02/15/2016   Procedure: Left Heart Cath and Cors/Grafts Angiography;  Surgeon: Peter M Martinique, MD;  Location: [place] CV [facility];  Service: Cardiovascular;  Laterality: N/A;  . CLUB FOOT RELEASE Bilateral    1st and 2nd toes  . CORONARY ARTERY BYPASS GRAFT  07/16/2007   LIMA to LAD, VG to ramus, VG to PDA   . CORONARY STENT INTERVENTION N/A 06/23/2019   Procedure: CORONARY STENT INTERVENTION;  Surgeon: Lorretta Harp, MD;  Location: [place] [facility];  Service: Cardiovascular;  Laterality: N/A;  . FLEXIBLE BRONCHOSCOPY  07/16/2007  . HAMMERTOE RECONSTRUCTION WITH WEIL OSTEOTOMY Right 06/03/2015   Procedure: RIGHT SECOND HAMMERTOE CORRECTION WITH MT WEIL OSTEOTOMY;  Surgeon: Wylene Simmer, MD;  Location: [place];  Service: Orthopedics;  Laterality: Right;  . HAND SURGERY Bilateral    1st and 2nd fingers - release of clubbing  . KNEE ARTHROSCOPY WITH ANTERIOR CRUCIATE LIGAMENT (ACL) REPAIR Right 01/30/2008  . LEFT HEART CATH AND CORS/GRAFTS ANGIOGRAPHY N/A 06/23/2019   Procedure: LEFT HEART CATH AND CORS/GRAFTS ANGIOGRAPHY;  Surgeon: Lorretta Harp, MD;  Location: [place] [facility];  Service: Cardiovascular;  Laterality: N/A;  . LEFT HEART CATH AND CORS/GRAFTS ANGIOGRAPHY N/A 03/01/2020   Procedure: LEFT HEART CATH AND CORS/GRAFTS ANGIOGRAPHY;  Surgeon: Belva Crome, MD;  Location: [place] [facility];  Service: Cardiovascular;   Laterality: N/A;  . NOVASURE ABLATION  12/19/2007   with removal of IUD  . ORIF FOOT FRACTURE Left   . TOE SURGERIES TO REMOVE NAILS DUE TO CLUBBING    . TOTAL KNEE ARTHROPLASTY Right 10/21/2012   Procedure: TOTAL KNEE ARTHROPLASTY;  Surgeon: Mauri Pole, MD;  Location: [facility];  Service: Orthopedics;  Laterality: Right;     Home Medications:  Prior to Admission medications   Medication Sig Start Date End Date Taking? Authorizing Provider  albuterol (PROVENTIL HFA;VENTOLIN HFA) 108 (90 BASE) MCG/ACT inhaler Inhale 2 puffs into the lungs every 6 (six) hours as needed for wheezing or shortness of breath.    Yes [provider]  Alirocumab (PRALUENT) 150 MG/ML SOAJ Inject 1 pen into the skin every 14 (fourteen) days. 09/22/19  Yes Lorretta Harp, MD  aspirin 81 MG chewable tablet Sarina Ser  81 mg by mouth daily.   Yes [provider]  atorvastatin (LIPITOR) 80 MG tablet Take 1 tablet (80 mg total) by mouth daily. Need to make an appt for overdue visit 06/30/19  Yes Belva Crome, MD  BREO ELLIPTA 100-25 MCG/INH AEPB Inhale 1 puff into the lungs daily.  07/29/19  Yes [provider]  brimonidine (ALPHAGAN) 0.15 % ophthalmic solution Place 1 drop into both eyes 2 (two) times daily.    Yes [provider]  cholecalciferol (VITAMIN D3) 25 MCG (1000 UNIT) tablet Take 1,000 Units by mouth daily.   Yes [provider]  clopidogrel (PLAVIX) 75 MG tablet Take 1 tablet (75 mg total) by mouth daily with breakfast. 06/26/19  Yes Furth, Cadence H, PA-C  escitalopram (LEXAPRO) 10 MG tablet Take 10 mg by mouth daily. 05/31/19  Yes [provider]  fluticasone (FLONASE) 50 MCG/ACT nasal spray Place 1 spray into both nostrils daily. 06/13/19  Yes [provider]  isosorbide mononitrate (IMDUR) 30 MG 24 hr tablet Take 30 mg by mouth daily.   Yes [provider]  lamoTRIgine (LAMICTAL) 150 MG tablet Take 150 mg by mouth daily.  08/05/19  Yes [provider]  metoprolol tartrate (LOPRESSOR) 25 MG tablet Take 0.5 tablets (12.5 mg total) by mouth 2 (two) times daily. 06/25/19  Yes Furth, Cadence H, PA-C  niacin (NIASPAN) 1000 MG CR tablet TAKE 1 TABLET (1,000 MG TOTAL) BY MOUTH AT BEDTIME. Patient taking differently: Take 1,000 mg by mouth at bedtime.  02/07/19  Yes Lorretta Harp, MD  nitroGLYCERIN (NITROSTAT) 0.4 MG SL tablet Place 1 tablet (0.4 mg total) under the tongue every 5 (five) minutes as needed for chest pain. 07/03/19  Yes Kilroy, Luke K, PA-C  ondansetron (ZOFRAN-ODT) 8 MG disintegrating tablet Take 8 mg by mouth every 8 (eight) hours as needed for nausea or vomiting.  02/26/20  Yes [provider]  QUEtiapine (SEROQUEL) 200 MG tablet Take 125 mg by mouth at bedtime. Takes 1 1/4 tablet (125 mg) 05/31/19  Yes [provider]  VASCEPA 1 g capsule TAKE 2 CAPSULES (2 G TOTAL) BY MOUTH 2 (TWO) TIMES DAILY. 11/26/19  Yes Lorretta Harp, MD  vitamin B-12 (CYANOCOBALAMIN) 1000 MCG tablet Take 1,000 mcg by mouth daily.   Yes [provider]    Inpatient Medications: Scheduled Meds: . [START ON 03/14/2020] Alirocumab  150 mcg Subcutaneous Q14 Days  . aspirin  81 mg Oral Daily  . atorvastatin  80 mg Oral Daily  . brimonidine  1 drop Both Eyes BID  . Chlorhexidine Gluconate Cloth  6 each Topical Q0600  . cholecalciferol  1,000 Units Oral Daily  . clopidogrel  75 mg Oral Q breakfast  . escitalopram  10 mg Oral Daily  . fluticasone  1 spray Each Nare Daily  . fluticasone furoate-vilanterol  1 puff Inhalation Daily  . isosorbide mononitrate  60 mg Oral Daily  . lamoTRIgine  150 mg Oral Daily  . metoprolol tartrate  12.5 mg Oral BID  . niacin  1,000 mg Oral QHS  . QUEtiapine  125 mg Oral QHS  . vitamin B-12  1,000 mcg Oral Daily   Continuous Infusions: . heparin 900 Units/hr (03/06/20 0600)  . nitroGLYCERIN Stopped (03/06/20 0500)   PRN Meds: acetaminophen, albuterol, nitroGLYCERIN, nitroGLYCERIN,  ondansetron (ZOFRAN) IV, ondansetron  Allergies:    Allergies  Allergen Reactions  . Benadryl [Diphenhydramine Hcl] Shortness Of Breath and Swelling    Tongue swelling  .  Codeine Other (See Comments)    "makes me psychotic"   . Haldol [Haloperidol Decanoate] Anaphylaxis  . Amoxicillin Hives and Swelling    Swelling to extremities Did it involve swelling of the face/tongue/throat, SOB, or low BP? Yes Did it involve sudden or severe rash/hives, skin peeling, or any reaction on the inside of your mouth or nose? Yes Did you need to seek medical attention at a hospital or doctor's office? Yes When did it last happen?young adult If all above answers are "NO", may proceed with cephalosporin use.  . Bee Venom Swelling and Other (See Comments)    "pimples" from head to toe and whole body swelling    Social History:   Social History   Socioeconomic History  . Marital status: Divorced    Spouse name: Not on file  . Number of children: Not on file  . Years of education: Not on file  . Highest education level: Not on file  Occupational History  . Not on file  Tobacco Use  . Smoking status: Former Smoker    Packs/day: 0.00    Years: 0.00    Pack years: 0.00    Quit date: 05/21/2015    Years since quitting: 4.7  . Smokeless tobacco: Never Used  . Tobacco comment: pt states she quit a week ago 06/14/19  Substance and Sexual Activity  . Alcohol use: No  . Drug use: No  . Sexual activity: Not on file  Other Topics Concern  . Not on file  Social History Narrative  . Not on file   Social Determinants of Health   Financial Resource Strain:   . Difficulty of Paying Living Expenses:   Food Insecurity:   . Worried About Charity fundraiser in the Last Year:   . Arboriculturist in the Last Year:   Transportation Needs:   . Film/video editor (Medical):   Marland Kitchen Lack of Transportation (Non-Medical):   Physical Activity:   . Days of Exercise per Week:   . Minutes of Exercise  per Session:   Stress:   . Feeling of Stress :   Social Connections:   . Frequency of Communication with Friends and Family:   . Frequency of Social Gatherings with Friends and Family:   . Attends Religious Services:   . Active Member of Clubs or Organizations:   . Attends Archivist Meetings:   Marland Kitchen Marital Status:   Intimate Partner Violence:   . Fear of Current or Ex-Partner:   . Emotionally Abused:   Marland Kitchen Physically Abused:   . Sexually Abused:     Family History:    Family History  Problem Relation Age of Onset  . Heart disease Father   . Anesthesia problems Father        states "did not do well" with anesthesia  . Lung cancer Mother      ROS:  Please see the history of present illness.   All other ROS reviewed and negative.     Physical Exam/Data:   Vitals:   03/06/20 0600 03/06/20 0700 03/06/20 0800 03/06/20 0835  BP: 132/68 113/61 (!) 151/92   Pulse: (!) 57 (!) 53 63   Resp: 15 14 20    Temp:    98 F (36.7 C)  TempSrc:    Oral  SpO2: 99% 98% 100%   Weight:      Height:        Intake/Output Summary (Last 24 hours) at 03/06/2020 0840 Last data  filed at 03/06/2020 0600 Gross per 24 hour  Intake 226.13 ml  Output 200 ml  Net 26.13 ml   Last 3 Weights 03/06/2020 02/29/2020 01/26/2020  Weight (lbs) 196 lb 3.4 oz 194 lb 198 lb 6.6 oz  Weight (kg) 89 kg 87.998 kg 90 kg     Body mass index is 32.65 kg/m.  GEN: Well nourished, well developed in no acute distress HEENT: Normal NECK: No JVD; No carotid bruits LYMPHATICS: No lymphadenopathy CARDIAC:RRR, no murmurs, rubs, gallops RESPIRATORY:  Clear to auscultation without rales, wheezing or rhonchi  ABDOMEN: Soft, non-tender, non-distended MUSCULOSKELETAL:  No edema; No deformity  SKIN: Warm and dry NEUROLOGIC:  Alert and oriented x 3 PSYCHIATRIC:  Normal affect   EKG: NSR with inferior and anterior infarct, LVH by voltage, nonspecific T wave abnormality  Relevant CV Studies:  2D echo  03/01/2020 IMPRESSIONS    1. Left ventricular ejection fraction, by estimation, is 55 to 60%. The  left ventricle has normal function. The left ventricle has no regional  wall motion abnormalities. Left ventricular diastolic parameters were  normal.  2. Right ventricular systolic function is normal. The right ventricular  size is normal. Tricuspid regurgitation signal is inadequate for assessing  PA pressure.  3. The mitral valve is normal in structure. Trivial mitral valve  regurgitation. No evidence of mitral stenosis.  4. The aortic valve is tricuspid. Aortic valve regurgitation is not  visualized. Mild to moderate aortic valve sclerosis/calcification is  present, without any evidence of aortic stenosis.  5. The inferior vena cava is normal in size with greater than 50%  respiratory variability, suggesting right atrial pressure of 3 mmHg.   Cardiac Cath 03/01/2020 Conclusion   Patent left internal mammary graft to the LAD.  Diffusely diseased but patent saphenous vein graft to the ramus intermedius.  Total occlusion of the mid and distal segments of the saphenous vein graft to the PDA.  PDA and other left ventricular branches of the right coronary regular supplied by left to right collaterals.  Patent but diffusely diseased native left main.  Functional occlusion of the left anterior descending after the origin of the first diagonal.  The segment from the ostial LAD into the diagonal is diffusely diseased up to 60 to 70%.  Patent circumflex artery that includes a single obtuse marginal.  Ostial to mid vessel luminal irregularities with eccentric stenoses up to 60 to 70%.  Unchanged from prior imaging.  Inferobasal akinesis.  EF 40%.  LVEDP 14 mmHg on IV nitroglycerin  RECOMMENDATIONS:   Continue aggressive risk factor modification.  Add long-acting nitrates to improve collateral flow.  IV nitroglycerin is discontinued.  Subcutaneous heparin DVT  prophylaxis.  Continue dual antiplatelet therapy but consider clopidogrel monotherapy after 3 months.   Laboratory Data:  High Sensitivity Troponin:   Recent Labs  Lab 02/29/20 2025 02/29/20 2225 03/01/20 0131 03/05/20 1400 03/05/20 1720  TROPONINIHS 103* 84* 113* 23* 68*     Chemistry Recent Labs  Lab 03/01/20 0131 03/02/20 0229 03/05/20 1400  NA 136 139 139  K 3.1* 3.4* 4.2  CL 100 105 103  CO2 25 24 22   GLUCOSE 168* 110* 204*  BUN 18 10 26*  CREATININE 1.04* 0.93 0.97  CALCIUM 8.1* 8.7* 9.3  GFRNONAA 59* >60 >60  GFRAA >60 >60 >60  ANIONGAP 11 10 14     Recent Labs  Lab 02/29/20 2042 03/02/20 0229  PROT 7.9 6.2*  ALBUMIN 4.2 3.2*  AST 21 16  ALT 20 17  ALKPHOS 117 98  BILITOT 1.0 0.7   Hematology Recent Labs  Lab 03/02/20 0229 03/05/20 1400 03/06/20 0239  WBC 10.5 15.3* 13.2*  RBC 3.72* 4.01 3.51*  HGB 11.8* 12.7 11.2*  HCT 35.5* 39.0 33.8*  MCV 95.4 97.3 96.3  MCH 31.7 31.7 31.9  MCHC 33.2 32.6 33.1  RDW 15.3 15.8* 15.7*  PLT 259 283 264   BNPNo results for input(s): BNP, PROBNP in the last 168 hours.  DDimer No results for input(s): DDIMER in the last 168 hours.   Radiology/Studies:  DG Chest 2 View  Result Date: 03/05/2020 CLINICAL DATA:  Chest pain. EXAM: CHEST - 2 VIEW COMPARISON:  February 29, 2020. FINDINGS: The heart size and mediastinal contours are within normal limits. Both lungs are clear. No pneumothorax or pleural effusion is noted. Status post coronary bypass graft. The visualized skeletal structures are unremarkable. IMPRESSION: No active cardiopulmonary disease. Electronically Signed   By: Marijo Conception M.D.   On: 03/05/2020 14:21       TIMI Risk Score for Unstable Angina or Non-ST Elevation MI:   The patient's TIMI risk score is 5, which indicates a 26% risk of all cause mortality, new or recurrent myocardial infarction or need for urgent revascularization in the next 14 days.      Assessment and Plan:   1.  Chest  pain -she says this is how her sx presented in the past with her angina but somewhat atypical in that she has lower abdominal pain as well as epigastric pain that radiates into her chest and significant N/V - hs troponin 103 --> 84 --> 113 on last admission a week ago and hsTrop now lower at 23>68. -sx similar to earlier this week when she was found to have mild elevation in trop and cath with patent LIMA>LAD, diffusely dz but patent SVG>IM, occluded mid and distal SVG to PDA with L>R collaterals supplying LV and PDA, diffusely disease LM but patent, occluded LAD after D1, patent LCx and OM with 60-70% ostial and mid vessel stenosis >>SVG>RCA stent occlusion was new -I am not convinced her sx are cardiac in origin and may be due to underlying GI etiology.  The only reduced blood flow on cath was in distal RCA with collaterals from left so only a small portion of myocardium at risk and her sx are GI as well -will increase Imdur to 90mg  daily and add Ranexa 500mg  BID to help improve collateral blood flow to distal RCA/PDA -continue ASA 81mg  daily for 3 months and then stop -continue high dose lipitor, Plavix 75mg  daily and BB -wean off IV NTG -would get GI evaluation to look for GI causes of CP since she is also having abdominal pain, epigastric pain and N/V with bloating  2.  CAD s/p CABG x 3 - history of multiple PCIs -cath with patent LIMA>LAD, diffusely dz but patent SVG>IM, occluded mid and distal SVG to PDA with L>R collaterals supplying LV and PDA, diffusely disease LM but patent, occluded LAD after D1, patent LCx and OM with 60-70% ostial and mid vessel stenosis unchanged from prior study - continue ASA, plavix, statin and BB  3.  HTN -BP borderline controlled -Continue lopressor 12.5mg  BID>>cannot increase further due to bradycardia -increasing nitrates  4.  Hyperlipidemia 06/21/2019: VLDL 37 10/22/2019: Cholesterol, Total 117; HDL 55; LDL Chol Calc (NIH) 41; Triglycerides 117 - continue  statin, vascepa, and praluent     For questions or updates, please contact [facility] Please consult [email or web address]  for contact info under    Signed, Fransico Him, MD  03/06/2020 8:40 AM

## 2020-03-07 LAB — BASIC METABOLIC PANEL
Anion gap: 15 (ref 5–15)
BUN: 16 mg/dL (ref 6–20)
CO2: 21 mmol/L — ABNORMAL LOW (ref 22–32)
Calcium: 8.9 mg/dL (ref 8.9–10.3)
Chloride: 105 mmol/L (ref 98–111)
Creatinine, Ser: 0.95 mg/dL (ref 0.44–1.00)
GFR calc Af Amer: >60 mL/min (ref >60)
GFR calc non Af Amer: >60 mL/min (ref >60)
Glucose, Bld: 119 mg/dL — ABNORMAL HIGH (ref 70–99)
Potassium: 4.2 mmol/L (ref 3.5–5.1)
Sodium: 141 mmol/L (ref 135–145)

## 2020-03-07 LAB — CBC
HCT: 34.5 % — ABNORMAL LOW (ref 36.0–46.0)
Hemoglobin: 11.3 g/dL — ABNORMAL LOW (ref 12.0–15.0)
MCH: 32 pg (ref 26.0–34.0)
MCHC: 32.8 g/dL (ref 30.0–36.0)
MCV: 97.7 fL (ref 80.0–100.0)
Platelets: 262 10*3/uL (ref 150–400)
RBC: 3.53 MIL/uL — ABNORMAL LOW (ref 3.87–5.11)
RDW: 16.1 % — ABNORMAL HIGH (ref 11.5–15.5)
WBC: 11.8 10*3/uL — ABNORMAL HIGH (ref 4.0–10.5)
nRBC: 0 % (ref 0.0–0.2)

## 2020-03-07 LAB — HEPARIN LEVEL (UNFRACTIONATED): Heparin Unfractionated: 0.57 IU/mL (ref 0.30–0.70)

## 2020-03-07 MED ORDER — ISOSORBIDE MONONITRATE ER 30 MG PO TB24: 90.0000 mg | ORAL_TABLET | Freq: Every day | ORAL | 0 refills | Status: DC

## 2020-03-07 MED ORDER — RANOLAZINE ER 500 MG PO TB12: 500.0000 mg | ORAL_TABLET | Freq: Two times a day (BID) | ORAL | 0 refills | Status: DC

## 2020-03-07 NOTE — Plan of Care (Signed)
  Problem: Education: Goal: Understanding of cardiac disease, CV risk reduction, and recovery process will improve Outcome: Completed/Met Goal: Individualized Educational Video(s) Outcome: Completed/Met   Problem: Activity: Goal: Ability to tolerate increased activity will improve Outcome: Completed/Met   Problem: Cardiac: Goal: Ability to achieve and maintain adequate cardiovascular perfusion will improve Outcome: Completed/Met   Problem: Health Behavior/Discharge Planning: Goal: Ability to safely manage health-related needs after discharge will improve Outcome: Completed/Met

## 2020-03-07 NOTE — Discharge Summary (Signed)
Physician Discharge Summary  LYAH MILLIRONS STM:[id number] DOB: 04-20-1961 DOA: 03/05/2020  PCP: Katherina Mires, MD  Admit date: 03/05/2020 Discharge date: 03/07/2020  Admitted From: Home Disposition: Home  Recommendations for Outpatient Follow-up:  Follow up with PCP in 1-2 weeks, cardiology as scheduled, GI in the next 1 to 2 weeks.  Discharge Condition: Stable CODE STATUS: Full Diet recommendation: Low-fat, low-salt cardiac diet.  Brief/Interim Summary: GLORIAJEAN OKUN a 59 y.o.femalewith medical history significant ofinoperable coronary artery disease hypertension hyperlipidemia CABG, status post right TKA, morbid obesity, who was recently admitted to the hospital and was discharged with medical management of angina. Patient was seen by cardiology at the time and was told she has two-vessel disease with 100% occlusion of one of them. She came back today secondary to worsening chest pain at home unable to function. She took off her medicines today but vomited. Her pain is rated as 7-9 out of 10. Also some nausea and some vomiting patient has been initiated on IV nitroglycerin morphine and other supportive medications. She is being admitted with persistent ongoing angina chest pain. In ED: Temperature 98.7 blood pressure 117/89 pulse 79 respirate of 32 oxygen sat 91% on room air. White count 15.3 BUN 26 otherwise rest of CBC and chemistry within normal. Chest x-ray is normal EKG showed no acute findings. Patient being admitted with intractable chest pain secondary to known inoperable angina  As above with acute recurrent chest pain with notable coronary artery disease not amenable to further intervention.  Allergy following in house, patient's medications were titrated including increase of Imdur to 90 mg as well as Ranexa 500 mg twice daily.  Patient's other comorbidity stable, core measures including aspirin, Lipitor, Plavix, beta-blocker ongoing in the setting of known CAD  status post three-vessel CABG.  Wise will need close follow-up with PCP and cardiology for further evaluation treatment, patient would also benefit from further outpatient evaluation from GI given her atypical chest pain findings could be related to her history of reflux but appears to be resolving on current regimen.  Discharge Diagnoses:  Principal Problem:   Cardiac chest pain Active Problems:   S/P right TKA   Hyperlipidemia LDL goal <70   Essential hypertension   Unstable angina (HCC)   CAD- S/P PCI   Hx of CABG    Discharge Instructions  Discharge Instructions    Call MD for:  difficulty breathing, headache or visual disturbances   Complete by: As directed    Call MD for:  extreme fatigue   Complete by: As directed    Call MD for:  hives   Complete by: As directed    Call MD for:  persistant dizziness or light-headedness   Complete by: As directed    Call MD for:  persistant nausea and vomiting   Complete by: As directed    Call MD for:  redness, tenderness, or signs of infection (pain, swelling, redness, odor or green/yellow discharge around incision site)   Complete by: As directed    Call MD for:  severe uncontrolled pain   Complete by: As directed    Call MD for:  temperature >100.4   Complete by: As directed    Change dressing (specify)   Complete by: As directed    Dressing change: daily until follow up with PCP.   Diet - low sodium heart healthy   Complete by: As directed    Increase activity slowly   Complete by: As directed  Allergies as of 03/07/2020      Reactions   Benadryl [diphenhydramine Hcl] Shortness Of Breath, Swelling   Tongue swelling   Codeine Other (See Comments)   "makes me psychotic"   Haldol [haloperidol Decanoate] Anaphylaxis   Amoxicillin Hives, Swelling   Swelling to extremities Did it involve swelling of the face/tongue/throat, SOB, or low BP? Yes Did it involve sudden or severe rash/hives, skin peeling, or any reaction on the  inside of your mouth or nose? Yes Did you need to seek medical attention at a hospital or doctor's office? Yes When did it last happen?young adult If all above answers are "NO", may proceed with cephalosporin use.   Bee Venom Swelling, Other (See Comments)   "pimples" from head to toe and whole body swelling      Medication List    TAKE these medications   albuterol 108 (90 Base) MCG/ACT inhaler Commonly known as: VENTOLIN HFA Inhale 2 puffs into the lungs every 6 (six) hours as needed for wheezing or shortness of breath.   aspirin 81 MG chewable tablet Chew 81 mg by mouth daily.   atorvastatin 80 MG tablet Commonly known as: LIPITOR Take 1 tablet (80 mg total) by mouth daily. Need to make an appt for overdue visit   Breo Ellipta 100-25 MCG/INH Aepb Generic drug: fluticasone furoate-vilanterol Inhale 1 puff into the lungs daily.   brimonidine 0.15 % ophthalmic solution Commonly known as: ALPHAGAN Place 1 drop into both eyes 2 (two) times daily.   cholecalciferol 25 MCG (1000 UNIT) tablet Commonly known as: VITAMIN D3 Take 1,000 Units by mouth daily.   clopidogrel 75 MG tablet Commonly known as: PLAVIX Take 1 tablet (75 mg total) by mouth daily with breakfast.   escitalopram 10 MG tablet Commonly known as: LEXAPRO Take 10 mg by mouth daily.   fluticasone 50 MCG/ACT nasal spray Commonly known as: FLONASE Place 1 spray into both nostrils daily.   isosorbide mononitrate 30 MG 24 hr tablet Commonly known as: IMDUR Take 3 tablets (90 mg total) by mouth daily. What changed:   medication strength  how much to take   lamoTRIgine 150 MG tablet Commonly known as: LAMICTAL Take 150 mg by mouth daily.   metoprolol tartrate 25 MG tablet Commonly known as: LOPRESSOR Take 0.5 tablets (12.5 mg total) by mouth 2 (two) times daily.   niacin 1000 MG CR tablet Commonly known as: NIASPAN TAKE 1 TABLET (1,000 MG TOTAL) BY MOUTH AT BEDTIME. What changed: See the new  instructions.   nitroGLYCERIN 0.4 MG SL tablet Commonly known as: NITROSTAT Place 1 tablet (0.4 mg total) under the tongue every 5 (five) minutes as needed for chest pain.   ondansetron 8 MG disintegrating tablet Commonly known as: ZOFRAN-ODT Take 8 mg by mouth every 8 (eight) hours as needed for nausea or vomiting.   Praluent 150 MG/ML Soaj Generic drug: Alirocumab Inject 1 pen into the skin every 14 (fourteen) days.   QUEtiapine 200 MG tablet Commonly known as: SEROQUEL Take 125 mg by mouth at bedtime. Takes 1 1/4 tablet (125 mg)   QUEtiapine 100 MG tablet Commonly known as: SEROQUEL Take 150 mg by mouth at bedtime.   ranolazine 500 MG 12 hr tablet Commonly known as: RANEXA Take 1 tablet (500 mg total) by mouth 2 (two) times daily.   Vascepa 1 g capsule Generic drug: icosapent Ethyl TAKE 2 CAPSULES (2 G TOTAL) BY MOUTH 2 (TWO) TIMES DAILY.   vitamin B-12 1000 MCG tablet Commonly known  as: CYANOCOBALAMIN Take 1,000 mcg by mouth daily.            Discharge Care Instructions  (From admission, onward)         Start     Ordered   03/07/20 0000  Change dressing (specify)       Comments: Dressing change: daily until follow up with PCP.   03/07/20 0910          Allergies  Allergen Reactions  . Benadryl [Diphenhydramine Hcl] Shortness Of Breath and Swelling    Tongue swelling  . Codeine Other (See Comments)    "makes me psychotic"   . Haldol [Haloperidol Decanoate] Anaphylaxis  . Amoxicillin Hives and Swelling    Swelling to extremities Did it involve swelling of the face/tongue/throat, SOB, or low BP? Yes Did it involve sudden or severe rash/hives, skin peeling, or any reaction on the inside of your mouth or nose? Yes Did you need to seek medical attention at a hospital or doctor's office? Yes When did it last happen?young adult If all above answers are "NO", may proceed with cephalosporin use.  . Bee Venom Swelling and Other (See Comments)     "pimples" from head to toe and whole body swelling    Consultations:  Cardiology, Dr. Radford Pax   Procedures/Studies: DG Chest 2 View  Result Date: 03/05/2020 CLINICAL DATA:  Chest pain. EXAM: CHEST - 2 VIEW COMPARISON:  February 29, 2020. FINDINGS: The heart size and mediastinal contours are within normal limits. Both lungs are clear. No pneumothorax or pleural effusion is noted. Status post coronary bypass graft. The visualized skeletal structures are unremarkable. IMPRESSION: No active cardiopulmonary disease. Electronically Signed   By: Marijo Conception M.D.   On: 03/05/2020 14:21   DG Chest 2 View  Result Date: 02/29/2020 CLINICAL DATA:  Chest pressure, abdominal pain EXAM: CHEST - 2 VIEW COMPARISON:  01/26/2020 FINDINGS: Frontal and lateral views of the chest demonstrates stable postsurgical changes from bypass surgery. Cardiac silhouette is unremarkable. No airspace disease, effusion, or pneumothorax. No acute bony abnormalities. IMPRESSION: 1. No acute intrathoracic process. Electronically Signed   By: Randa Ngo M.D.   On: 02/29/2020 20:43   CARDIAC CATHETERIZATION  Result Date: 03/01/2020  Patent left internal mammary graft to the LAD.  Diffusely diseased but patent saphenous vein graft to the ramus intermedius.  Total occlusion of the mid and distal segments of the saphenous vein graft to the PDA.  PDA and other left ventricular branches of the right coronary regular supplied by left to right collaterals.  Patent but diffusely diseased native left main.  Functional occlusion of the left anterior descending after the origin of the first diagonal.  The segment from the ostial LAD into the diagonal is diffusely diseased up to 60 to 70%.  Patent circumflex artery that includes a single obtuse marginal.  Ostial to mid vessel luminal irregularities with eccentric stenoses up to 60 to 70%.  Unchanged from prior imaging.  Inferobasal akinesis.  EF 40%.  LVEDP 14 mmHg on IV nitroglycerin  RECOMMENDATIONS:  Continue aggressive risk factor modification.  Add long-acting nitrates to improve collateral flow.  IV nitroglycerin is discontinued.  Subcutaneous heparin DVT prophylaxis.  Continue dual antiplatelet therapy but consider clopidogrel monotherapy after 3 months.  ECHOCARDIOGRAM COMPLETE  Result Date: 03/01/2020    ECHOCARDIOGRAM REPORT   Patient Name:   JOYCE HEITMAN Date of Exam: 03/01/2020 Medical Rec #:  [id number]         Height:  65.0 in Accession #:    [id number]        Weight:       194.0 lb Date of Birth:  04/01/1961         BSA:          1.953 m Patient Age:    59 years          BP:           122/73 mmHg Patient Gender: F                 HR:           58 bpm. 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 Prior CABG; Risk Factors:Hypertension and                 Dyslipidemia.  Sonographer:    Johny Chess Referring Phys: [place]  1. Left ventricular ejection fraction, by estimation, is 55 to 60%. The left ventricle has normal function. The left ventricle has no regional wall motion abnormalities. Left ventricular diastolic parameters were normal.  2. Right ventricular systolic function is normal. The right ventricular size is normal. Tricuspid regurgitation signal is inadequate for assessing PA pressure.  3. The mitral valve is normal in structure. Trivial mitral valve regurgitation. No evidence of mitral stenosis.  4. The aortic valve is tricuspid. Aortic valve regurgitation is not visualized. Mild to moderate aortic valve sclerosis/calcification is present, without any evidence of aortic stenosis.  5. The inferior vena cava is normal in size with greater than 50% respiratory variability, suggesting right atrial pressure of 3 mmHg. FINDINGS  Left Ventricle: Left ventricular ejection  fraction, by estimation, is 55 to 60%. The left ventricle has normal function. The left ventricle has no regional wall motion abnormalities. The left ventricular internal cavity size was normal in size. There is  no left ventricular hypertrophy. Left ventricular diastolic parameters were normal. Normal left ventricular filling pressure. Right Ventricle: The right ventricular size is normal. No increase in right ventricular wall thickness. Right ventricular systolic function is normal. Tricuspid regurgitation signal is inadequate for assessing PA pressure. Left Atrium: Left atrial size was normal in size. Right Atrium: Right atrial size was normal in size. Pericardium: There is no evidence of pericardial effusion. Mitral Valve: The mitral valve is normal in structure. Normal mobility of the mitral valve leaflets. Trivial mitral valve regurgitation. No evidence of mitral valve stenosis. Tricuspid Valve: The tricuspid valve is normal in structure. Tricuspid valve regurgitation is trivial. No evidence of tricuspid stenosis. Aortic Valve: The aortic valve is tricuspid. Aortic valve regurgitation is not visualized. Mild to moderate aortic valve sclerosis/calcification is present, without any evidence of aortic stenosis. Pulmonic Valve: The pulmonic valve was normal in structure. Pulmonic valve regurgitation is trivial. No evidence of pulmonic stenosis. Aorta: The aortic root is normal in size and structure. Venous: The inferior vena cava was not well visualized. The inferior vena cava is normal in size with greater than 50% respiratory variability, suggesting right atrial pressure of 3 mmHg. IAS/Shunts: No atrial level shunt detected by color flow Doppler.  LEFT VENTRICLE PLAX 2D LVIDd:         5.40 cm  Diastology LVIDs:  3.90 cm  LV e' lateral:   11.50 cm/s LV PW:         0.90 cm  LV E/e' lateral: 7.2 LV IVS:        1.00 cm  LV e' medial:    10.20 cm/s LVOT diam:     1.90 cm  LV E/e' medial:  8.1 LV SV:         54  LV SV Index:   28 LVOT Area:     2.84 cm  RIGHT VENTRICLE             IVC RV S prime:     11.40 cm/s  IVC diam: 1.70 cm TAPSE (M-mode): 2.0 cm LEFT ATRIUM             Index       RIGHT ATRIUM           Index LA diam:        3.60 cm 1.84 cm/m  RA Area:     17.40 cm LA Vol (A2C):   58.5 ml 29.96 ml/m RA Volume:   44.20 ml  22.64 ml/m LA Vol (A4C):   85.5 ml 43.79 ml/m LA Biplane Vol: 73.1 ml 37.44 ml/m  AORTIC VALVE LVOT Vmax:   84.20 cm/s LVOT Vmean:  51.800 cm/s LVOT VTI:    0.190 m  AORTA Ao Root diam: 2.90 cm MITRAL VALVE MV Area (PHT): 1.78 cm    SHUNTS MV Decel Time: 427 msec    Systemic VTI:  0.19 m MV E velocity: 82.30 cm/s  Systemic Diam: 1.90 cm MV A velocity: 62.10 cm/s MV E/A ratio:  1.33 Fransico Him MD Electronically signed by Fransico Him MD Signature Date/Time: 03/01/2020/11:58:49 PM    Final       Subjective: No acute issues or events overnight denies nausea, vomiting, diarrhea, constipation, headache, fever, chills, shortness of breath, chest pain, palpitations.   Discharge Exam: Vitals:   03/07/20 0800 03/07/20 0843  BP:    Pulse:    Resp:    Temp: (!) 97.3 F (36.3 C)   SpO2:  97%   Vitals:   03/07/20 0400 03/07/20 0515 03/07/20 0800 03/07/20 0843  BP:  125/72    Pulse: (!) 49 (!) 58    Resp: 10 11    Temp: 98.4 F (36.9 C)  (!) 97.3 F (36.3 C)   TempSrc: Oral  Oral   SpO2: 91% 94%  97%  Weight:      Height:        General: Pt is alert, awake, not in acute distress Cardiovascular: RRR, S1/S2 +, no rubs, no gallops Respiratory: CTA bilaterally, no wheezing, no rhonchi Abdominal: Soft, NT, ND, bowel sounds + Extremities: no edema, no cyanosis    The results of significant diagnostics from this hospitalization (including imaging, microbiology, ancillary and laboratory) are listed below for reference.     Microbiology: Recent Results (from the past 240 hour(s))  SARS Coronavirus 2 by RT PCR (hospital order, performed in [facility] hospital lab)  Nasopharyngeal Nasopharyngeal Swab     Status: None   Collection Time: 02/29/20 11:13 PM   Specimen: Nasopharyngeal Swab  Result Value Ref Range Status   SARS Coronavirus 2 NEGATIVE NEGATIVE Final    Comment: (NOTE) SARS-CoV-2 target nucleic acids are NOT DETECTED.  The SARS-CoV-2 RNA is generally detectable in upper and lower respiratory specimens during the acute phase of infection. The lowest concentration of SARS-CoV-2 viral copies this assay can detect is 250  copies / mL. A negative result does not preclude SARS-CoV-2 infection and should not be used as the sole basis for treatment or other patient management decisions.  A negative result may occur with improper specimen collection / handling, submission of specimen other than nasopharyngeal swab, presence of viral mutation(s) within the areas targeted by this assay, and inadequate number of viral copies (<250 copies / mL). A negative result must be combined with clinical observations, patient history, and epidemiological information.  Fact Sheet for Patients:   StrictlyIdeas.no  Fact Sheet for Healthcare Providers: BankingDealers.co.za  This test is not yet approved or  cleared by the [place] FDA and has been authorized for detection and/or diagnosis of SARS-CoV-2 by FDA under an Emergency Use Authorization (EUA).  This EUA will remain in effect (meaning this test can be used) for the duration of the COVID-19 declaration under Section 564(b)(1) of the Act, 21 U.S.C. section 360bbb-3(b)(1), unless the authorization is terminated or revoked sooner.  Performed at [facility], [place] [address], [place], [place] [address]   MRSA PCR Screening     Status: None   Collection Time: 03/01/20  1:13 AM   Specimen: Nasal Mucosa; Nasopharyngeal  Result Value Ref Range Status   MRSA by PCR NEGATIVE NEGATIVE Final    Comment:        The GeneXpert MRSA Assay  (FDA approved for NASAL specimens only), is one component of a comprehensive MRSA colonization surveillance program. It is not intended to diagnose MRSA infection nor to guide or monitor treatment for MRSA infections. Performed at [facility], [place] [address], [place], [place] [address]   SARS Coronavirus 2 by RT PCR (hospital order, performed in [facility] hospital lab) Nasopharyngeal Nasopharyngeal Swab     Status: None   Collection Time: 03/05/20  7:58 PM   Specimen: Nasopharyngeal Swab  Result Value Ref Range Status   SARS Coronavirus 2 NEGATIVE NEGATIVE Final    Comment: (NOTE) SARS-CoV-2 target nucleic acids are NOT DETECTED.  The SARS-CoV-2 RNA is generally detectable in upper and lower respiratory specimens during the acute phase of infection. The lowest concentration of SARS-CoV-2 viral copies this assay can detect is 250 copies / mL. A negative result does not preclude SARS-CoV-2 infection and should not be used as the sole basis for treatment or other patient management decisions.  A negative result may occur with improper specimen collection / handling, submission of specimen other than nasopharyngeal swab, presence of viral mutation(s) within the areas targeted by this assay, and inadequate number of viral copies (<250 copies / mL). A negative result must be combined with clinical observations, patient history, and epidemiological information.  Fact Sheet for Patients:   StrictlyIdeas.no  Fact Sheet for Healthcare Providers: BankingDealers.co.za  This test is not yet approved or  cleared by the [place] FDA and has been authorized for detection and/or diagnosis of SARS-CoV-2 by FDA under an Emergency Use Authorization (EUA).  This EUA will remain in effect (meaning this test can be used) for the duration of the COVID-19 declaration under Section 564(b)(1) of the Act, 21 U.S.C. section  360bbb-3(b)(1), unless the authorization is terminated or revoked sooner.  Performed at [facility], [place] [address] [place], [place] [address]   MRSA PCR Screening     Status: None   Collection Time: 03/06/20 12:18 AM   Specimen: Nasal Mucosa; Nasopharyngeal  Result Value Ref Range Status   MRSA by PCR NEGATIVE NEGATIVE Final  Comment:        The GeneXpert MRSA Assay (FDA approved for NASAL specimens only), is one component of a comprehensive MRSA colonization surveillance program. It is not intended to diagnose MRSA infection nor to guide or monitor treatment for MRSA infections. Performed at [facility], [place] [address], [place], [place] [address]      Labs: BNP (last 3 results) No results for input(s): BNP in the last 8760 hours. Basic Metabolic Panel: Recent Labs  Lab 02/29/20 2042 03/01/20 0131 03/02/20 0229 03/05/20 1400 03/07/20 0313  NA 138 136 139 139 141  K 3.4* 3.1* 3.4* 4.2 4.2  CL 100 100 105 103 105  CO2 23 25 24 22  21*  GLUCOSE 144* 168* 110* 204* 119*  BUN 23* 18 10 26* 16  CREATININE 1.13* 1.04* 0.93 0.97 0.95  CALCIUM 9.3 8.1* 8.7* 9.3 8.9  MG  --   --  1.8  --   --   PHOS  --   --  3.1  --   --    Liver Function Tests: Recent Labs  Lab 02/29/20 2042 03/02/20 0229  AST 21 16  ALT 20 17  ALKPHOS 117 98  BILITOT 1.0 0.7  PROT 7.9 6.2*  ALBUMIN 4.2 3.2*   Recent Labs  Lab 02/29/20 2042  LIPASE 37   No results for input(s): AMMONIA in the last 168 hours. CBC: Recent Labs  Lab 03/01/20 0131 03/02/20 0229 03/05/20 1400 03/06/20 0239 03/07/20 0313  WBC 12.1* 10.5 15.3* 13.2* 11.8*  HGB 12.1 11.8* 12.7 11.2* 11.3*  HCT 36.2 35.5* 39.0 33.8* 34.5*  MCV 95.0 95.4 97.3 96.3 97.7  PLT 282 259 283 264 262   Cardiac Enzymes: No results for input(s): CKTOTAL, CKMB, CKMBINDEX, TROPONINI in the last 168 hours. BNP: Invalid input(s): POCBNP CBG: No results for input(s): GLUCAP in the last 168  hours. D-Dimer No results for input(s): DDIMER in the last 72 hours. Hgb A1c No results for input(s): HGBA1C in the last 72 hours. Lipid Profile No results for input(s): CHOL, HDL, LDLCALC, TRIG, CHOLHDL, LDLDIRECT in the last 72 hours. Thyroid function studies No results for input(s): TSH, T4TOTAL, T3FREE, THYROIDAB in the last 72 hours.  Invalid input(s): FREET3 Anemia work up No results for input(s): VITAMINB12, FOLATE, FERRITIN, TIBC, IRON, RETICCTPCT in the last 72 hours. Urinalysis    Component Value Date/Time   COLORURINE YELLOW 06/24/2014 1835   APPEARANCEUR CLOUDY (A) 06/24/2014 1835   LABSPEC 1.021 06/24/2014 1835   PHURINE 5.5 06/24/2014 1835   GLUCOSEU NEGATIVE 06/24/2014 1835   HGBUR SMALL (A) 06/24/2014 1835   BILIRUBINUR NEGATIVE 06/24/2014 1835   KETONESUR NEGATIVE 06/24/2014 1835   PROTEINUR NEGATIVE 06/24/2014 1835   UROBILINOGEN 0.2 06/24/2014 1835   NITRITE NEGATIVE 06/24/2014 1835   LEUKOCYTESUR NEGATIVE 06/24/2014 1835   Sepsis Labs Invalid input(s): PROCALCITONIN,  WBC,  LACTICIDVEN Microbiology Recent Results (from the past 240 hour(s))  SARS Coronavirus 2 by RT PCR (hospital order, performed in [place] hospital lab) Nasopharyngeal Nasopharyngeal Swab     Status: None   Collection Time: 02/29/20 11:13 PM   Specimen: Nasopharyngeal Swab  Result Value Ref Range Status   SARS Coronavirus 2 NEGATIVE NEGATIVE Final    Comment: (NOTE) SARS-CoV-2 target nucleic acids are NOT DETECTED.  The SARS-CoV-2 RNA is generally detectable in upper and lower respiratory specimens during the acute phase of infection. The lowest concentration of SARS-CoV-2 viral copies this assay can detect is 250 copies / mL. A  negative result does not preclude SARS-CoV-2 infection and should not be used as the sole basis for treatment or other patient management decisions.  A negative result may occur with improper specimen collection / handling, submission of specimen  other than nasopharyngeal swab, presence of viral mutation(s) within the areas targeted by this assay, and inadequate number of viral copies (<250 copies / mL). A negative result must be combined with clinical observations, patient history, and epidemiological information.  Fact Sheet for Patients:   StrictlyIdeas.no  Fact Sheet for Healthcare Providers: BankingDealers.co.za  This test is not yet approved or  cleared by the [place] FDA and has been authorized for detection and/or diagnosis of SARS-CoV-2 by FDA under an Emergency Use Authorization (EUA).  This EUA will remain in effect (meaning this test can be used) for the duration of the COVID-19 declaration under Section 564(b)(1) of the Act, 21 U.S.C. section 360bbb-3(b)(1), unless the authorization is terminated or revoked sooner.  Performed at [facility], [place] [address] [place], [place] [address]   MRSA PCR Screening     Status: None   Collection Time: 03/01/20  1:13 AM   Specimen: Nasal Mucosa; Nasopharyngeal  Result Value Ref Range Status   MRSA by PCR NEGATIVE NEGATIVE Final    Comment:        The GeneXpert MRSA Assay (FDA approved for NASAL specimens only), is one component of a comprehensive MRSA colonization surveillance program. It is not intended to diagnose MRSA infection nor to guide or monitor treatment for MRSA infections. Performed at [facility], [place] [address], [place], [place] [address]   SARS Coronavirus 2 by RT PCR (hospital order, performed in [facility] hospital lab) Nasopharyngeal Nasopharyngeal Swab     Status: None   Collection Time: 03/05/20  7:58 PM   Specimen: Nasopharyngeal Swab  Result Value Ref Range Status   SARS Coronavirus 2 NEGATIVE NEGATIVE Final    Comment: (NOTE) SARS-CoV-2 target nucleic acids are NOT DETECTED.  The SARS-CoV-2 RNA is generally detectable in upper and  lower respiratory specimens during the acute phase of infection. The lowest concentration of SARS-CoV-2 viral copies this assay can detect is 250 copies / mL. A negative result does not preclude SARS-CoV-2 infection and should not be used as the sole basis for treatment or other patient management decisions.  A negative result may occur with improper specimen collection / handling, submission of specimen other than nasopharyngeal swab, presence of viral mutation(s) within the areas targeted by this assay, and inadequate number of viral copies (<250 copies / mL). A negative result must be combined with clinical observations, patient history, and epidemiological information.  Fact Sheet for Patients:   StrictlyIdeas.no  Fact Sheet for Healthcare Providers: BankingDealers.co.za  This test is not yet approved or  cleared by the [place] FDA and has been authorized for detection and/or diagnosis of SARS-CoV-2 by FDA under an Emergency Use Authorization (EUA).  This EUA will remain in effect (meaning this test can be used) for the duration of the COVID-19 declaration under Section 564(b)(1) of the Act, 21 U.S.C. section 360bbb-3(b)(1), unless the authorization is terminated or revoked sooner.  Performed at [facility], [place] [address] [place], [place] [address]   MRSA PCR Screening     Status: None   Collection Time: 03/06/20 12:18 AM   Specimen: Nasal Mucosa; Nasopharyngeal  Result Value Ref Range Status   MRSA by PCR NEGATIVE NEGATIVE Final    Comment:  The GeneXpert MRSA Assay (FDA approved for NASAL specimens only), is one component of a comprehensive MRSA colonization surveillance program. It is not intended to diagnose MRSA infection nor to guide or monitor treatment for MRSA infections. Performed at [facility], [place] [address], [place], [place] [address]      Time  coordinating discharge: Over 30 minutes  SIGNED:   Little Ishikawa, DO [facility] 03/07/2020, 9:10 AM Pager   If 7PM-7AM, please contact night-coverage [email or web address]

## 2020-03-07 NOTE — Progress Notes (Signed)
[place] for heparin Indication:ACS/STEMI  Allergies  Allergen Reactions  . Benadryl [Diphenhydramine Hcl] Shortness Of Breath and Swelling    Tongue swelling  . Codeine Other (See Comments)    "makes me psychotic"   . Haldol [Haloperidol Decanoate] Anaphylaxis  . Amoxicillin Hives and Swelling    Swelling to extremities Did it involve swelling of the face/tongue/throat, SOB, or low BP? Yes Did it involve sudden or severe rash/hives, skin peeling, or any reaction on the inside of your mouth or nose? Yes Did you need to seek medical attention at a hospital or doctor's office? Yes When did it last happen?young adult If all above answers are "NO", may proceed with cephalosporin use.  . Bee Venom Swelling and Other (See Comments)    "pimples" from head to toe and whole body swelling   Patient Measurements: Height: 5\' 5"  (165.1 cm) Weight: 89 kg (196 lb 3.4 oz) IBW/kg (Calculated) : 57 Heparin Dosing Weight: 76 kg  Vital Signs: Temp: 98.4 F (36.9 C) (07/18 0400) Temp Source: Oral (07/18 0400) BP: 125/72 (07/18 0515) Pulse Rate: 58 (07/18 0515)  Labs: Recent Labs    03/05/20 1400 03/05/20 1400 03/05/20 1720 03/06/20 0239 03/06/20 0239 03/06/20 0837 03/06/20 1639 03/07/20 0313  HGB 12.7   < >  --  11.2*  --   --   --  11.3*  HCT 39.0  --   --  33.8*  --   --   --  34.5*  PLT 283  --   --  264  --   --   --  262  HEPARINUNFRC  --   --   --  0.42   < > 0.66 0.55 0.57  CREATININE 0.97  --   --   --   --   --   --  0.95  TROPONINIHS 23*  --  68*  --   --   --   --   --    < > = values in this interval not displayed.   Estimated Creatinine Clearance: 71.1 mL/min (by C-G formula based on SCr of 0.95 mg/dL).  Medical History: Past Medical History:  Diagnosis Date  . Anemia    takes iron supplement  . Arthritis    knees, shoulders  . Bipolar 1 disorder ([place])   . Cataract, immature   . CHF (congestive heart failure) ([place])    . COPD (chronic obstructive pulmonary disease) ([place])   . Coronary artery disease    triple vessel  . Family history of adverse reaction to anesthesia    states father "did not do well" with anesthesia; died last time he had surgery; also had heart disease  . Gallstones   . GERD (gastroesophageal reflux disease)    occasional - no current med.  . Glaucoma   . Hammertoe 05/2015   right second toe  . Hepatic steatosis   . High cholesterol   . History of MI (myocardial infarction)   . Hypertension    states under control with meds., has been on med. x 8 yr.  . Panic attacks   . Peripheral vascular disease ([place])    carotid  . Renal cyst, left   . Schizophrenia ([place])   . Short of breath on exertion   . Sleep apnea    no CPAP use  . Urinary, incontinence, stress female   . Wears dentures    upper  . Wears partial dentures    lower  Assessment: 59 yo F to start heparin per pharmacy for chest pain/ACS/STEMI.  She was just in the hospital 03/01/20 and pharmacy dosed heparin for ACS then. CBC WNL. SCr WNL.  No anticoagulants PTA.  No bleeding PTA.  - ASA 324mg  x1, ASA 81mg  daily, Plavix continued  Heparin 4000 unit bolus, infusion at 900 units/hr  Today, 03/07/2020 Am Hep level = 0.57 Cards cleared patient for discharge  Goal of Therapy:  Heparin level 0.3-0.7 units/ml Monitor platelets by anticoagulation protocol: Yes   Plan:  Discontinue Heparin  Minda Ditto PharmD 03/07/2020, 7:01 AM

## 2020-03-07 NOTE — Progress Notes (Signed)
Progress Note  Patient Name: [name] Date of Encounter: 03/07/2020  Primary Cardiologist: Quay Burow, MD   Subjective   No further CP.    Inpatient Medications    Scheduled Meds:  [START ON 03/14/2020] Alirocumab  150 mcg Subcutaneous Q14 Days   alum & mag hydroxide-simeth  30 mL Oral Once   And   lidocaine  15 mL Oral Once   aspirin  81 mg Oral Daily   atorvastatin  80 mg Oral Daily   brimonidine  1 drop Both Eyes BID   Chlorhexidine Gluconate Cloth  6 each Topical Q0600   cholecalciferol  1,000 Units Oral Daily   clopidogrel  75 mg Oral Q breakfast   escitalopram  10 mg Oral Daily   fluticasone  1 spray Each Nare Daily   fluticasone furoate-vilanterol  1 puff Inhalation Daily   isosorbide mononitrate  90 mg Oral Daily   lamoTRIgine  150 mg Oral Daily   metoprolol tartrate  12.5 mg Oral BID   niacin  1,000 mg Oral QHS   QUEtiapine  125 mg Oral QHS   ranolazine  500 mg Oral BID   vitamin B-12  1,000 mcg Oral Daily   Continuous Infusions:  heparin 900 Units/hr (03/07/20 0700)   PRN Meds: acetaminophen, albuterol, nitroGLYCERIN, nitroGLYCERIN, ondansetron (ZOFRAN) IV, ondansetron   Vital Signs    Vitals:   03/07/20 0000 03/07/20 0005 03/07/20 0400 03/07/20 0515  BP:  (!) 103/50  125/72  Pulse:  (!) 46 (!) 49 (!) 58  Resp:  12 10 11   Temp: (!) 97.4 F (36.3 C)  98.4 F (36.9 C)   TempSrc: Axillary  Oral   SpO2:  94% 91% 94%  Weight:      Height:        Intake/Output Summary (Last 24 hours) at 03/07/2020 0813 Last data filed at 03/07/2020 0700 Gross per 24 hour  Intake 1163.66 ml  Output 600 ml  Net 563.66 ml   Filed Weights   03/06/20 0147  Weight: 89 kg    Telemetry    NSR - Personally Reviewed  ECG    No new EKG to review - Personally Reviewed  Physical Exam   GEN: No acute distress.   Neck: No JVD Cardiac: RRR, no murmurs, rubs, or gallops.  Respiratory: Clear to auscultation bilaterally. GI: Soft,  nontender, non-distended  MS: No edema; No deformity. Neuro:  Nonfocal  Psych: Normal affect   Labs    Chemistry Recent Labs  Lab 02/29/20 2042 03/01/20 0131 03/02/20 0229 03/05/20 1400 03/07/20 0313  NA 138   < > 139 139 141  K 3.4*   < > 3.4* 4.2 4.2  CL 100   < > 105 103 105  CO2 23   < > 24 22 21*  GLUCOSE 144*   < > 110* 204* 119*  BUN 23*   < > 10 26* 16  CREATININE 1.13*   < > 0.93 0.97 0.95  CALCIUM 9.3   < > 8.7* 9.3 8.9  PROT 7.9  --  6.2*  --   --   ALBUMIN 4.2  --  3.2*  --   --   AST 21  --  16  --   --   ALT 20  --  17  --   --   ALKPHOS 117  --  98  --   --   BILITOT 1.0  --  0.7  --   --  GFRNONAA 54*   < > >60 >60 >60  GFRAA >60   < > >60 >60 >60  ANIONGAP 15   < > 10 14 15    < > = values in this interval not displayed.     Hematology Recent Labs  Lab 03/05/20 1400 03/06/20 0239 03/07/20 0313  WBC 15.3* 13.2* 11.8*  RBC 4.01 3.51* 3.53*  HGB 12.7 11.2* 11.3*  HCT 39.0 33.8* 34.5*  MCV 97.3 96.3 97.7  MCH 31.7 31.9 32.0  MCHC 32.6 33.1 32.8  RDW 15.8* 15.7* 16.1*  PLT 283 264 262    Cardiac EnzymesNo results for input(s): TROPONINI in the last 168 hours. No results for input(s): TROPIPOC in the last 168 hours.   BNPNo results for input(s): BNP, PROBNP in the last 168 hours.   DDimer No results for input(s): DDIMER in the last 168 hours.   Radiology    DG Chest 2 View  Result Date: 03/05/2020 CLINICAL DATA:  Chest pain. EXAM: CHEST - 2 VIEW COMPARISON:  February 29, 2020. FINDINGS: The heart size and mediastinal contours are within normal limits. Both lungs are clear. No pneumothorax or pleural effusion is noted. Status post coronary bypass graft. The visualized skeletal structures are unremarkable. IMPRESSION: No active cardiopulmonary disease. Electronically Signed   By: Marijo Conception M.D.   On: 03/05/2020 14:21    Cardiac Studies    2D echo 03/01/2020 IMPRESSIONS    1. Left ventricular ejection fraction, by estimation, is 55 to  60%. The  left ventricle has normal function. The left ventricle has no regional  wall motion abnormalities. Left ventricular diastolic parameters were  normal.  2. Right ventricular systolic function is normal. The right ventricular  size is normal. Tricuspid regurgitation signal is inadequate for assessing  PA pressure.  3. The mitral valve is normal in structure. Trivial mitral valve  regurgitation. No evidence of mitral stenosis.  4. The aortic valve is tricuspid. Aortic valve regurgitation is not  visualized. Mild to moderate aortic valve sclerosis/calcification is  present, without any evidence of aortic stenosis.  5. The inferior vena cava is normal in size with greater than 50%  respiratory variability, suggesting right atrial pressure of 3 mmHg.   Cardiac Cath 03/01/2020 Conclusion   Patent left internal mammary graft to the LAD.  Diffusely diseased but patent saphenous vein graft to the ramus intermedius.  Total occlusion of the mid and distal segments of the saphenous vein graft to the PDA. PDA and other left ventricular branches of the right coronary regular supplied by left to right collaterals.  Patent but diffusely diseased native left main.  Functional occlusion of the left anterior descending after the origin of the first diagonal. The segment from the ostial LAD into the diagonal is diffusely diseased up to 60 to 70%.  Patent circumflex artery that includes a single obtuse marginal. Ostial to mid vessel luminal irregularities with eccentric stenoses up to 60 to 70%. Unchanged from prior imaging.  Inferobasal akinesis. EF 40%. LVEDP 14 mmHg on IV nitroglycerin  RECOMMENDATIONS:   Continue aggressive risk factor modification.  Add long-acting nitrates to improve collateral flow. IV nitroglycerin is discontinued.  Subcutaneous heparin DVT prophylaxis.  Continue dual antiplatelet therapy but consider clopidogrel monotherapy after 3  months.   Patient Profile     59 y.o. female  with a hx of CAD s/p CABG x 3, obesity, current smoker, HTN, HLD, OSA intolerant to CPAP, and COPD who is being seen today for the evaluation  of chest pain at the request of Dr. Avon Gully.  Assessment & Plan    1.  Chest pain -she says this is how her sx presented in the past with her angina but somewhat atypical in that she has lower abdominal pain as well as epigastric pain that radiates into her chest and significant N/V - hs troponin 103> 84>113 on last admission a week ago and hsTrop now lower at 23>68. -sx similar to earlier this week when she was found to have mild elevation in trop and cath with patent LIMA>LAD, diffusely dz but patent SVG>IM, occluded mid and distal SVG to PDA with L>R collaterals supplying LV and PDA, diffusely disease LM but patent, occluded LAD after D1, patent LCx and OM with 60-70% ostial and mid vessel stenosis >>SVG>RCA stent occlusion was new -I am not convinced her sx are cardiac in origin and may be due to underlying GI etiology.  The only reduced blood flow on cath was in distal RCA with collaterals from left so only a small portion of myocardium at risk and her sx are GI as well -Imdur increased to 90mg  daily and added Ranexa 500mg  BID to help improve collateral blood flow to distal RCA/PDA -continue ASA 81mg  daily for 3 months and then stop -continue high dose lipitor, Plavix 75mg  daily and BB -would get GI evaluation to look for GI causes of CP since she is also having abdominal pain, epigastric pain and N/V with bloating  2.  CAD s/p CABG x 3 - history of multiple PCIs -cath with patent LIMA>LAD, diffusely dz but patent SVG>IM, occluded mid and distal SVG to PDA with L>R collaterals supplying LV and PDA, diffusely disease LM but patent, occluded LAD after D1, patent LCx and OM with 60-70% ostial and mid vessel stenosis unchanged from prior study - continue ASA, plavix, statin and BB  3.  HTN -BP  controlled this am at 125/66mmHg -Continue lopressor 12.5mg  BID>>cannot increase further due to bradycardia -increasing nitrates  4.  Hyperlipidemia 06/21/2019: VLDL 37 10/22/2019: Cholesterol, Total 117; HDL 55; LDL Chol Calc (NIH) 41; Triglycerides 117 - continue statin, vascepa, and praluent     [facility] will sign off.   Medication Recommendations:  ASA 81mg  daily, Plavix 75mg  daily, Lopressor 12.5mg  BID, Ranexa 500mg  BID, Imdur 90mg  daily, atorvastatin 80mg  daily, Praulent 129mcg q 14 days, Vascepa 2gm BID Other recommendations (labs, testing, etc):  none Follow up as an outpatient:  She has followup in clinic with Dr. Gwenlyn Found on 8/4  For questions or updates, please contact [place] Please consult [email or web address] for contact info under Cardiology/STEMI.      Signed, Fransico Him, MD  03/07/2020, 8:13 AM

## 2020-03-09 DIAGNOSIS — I2510 Atherosclerotic heart disease of native coronary artery without angina pectoris: Secondary | ICD-10-CM | POA: Diagnosis not present

## 2020-03-09 DIAGNOSIS — I252 Old myocardial infarction: Secondary | ICD-10-CM | POA: Diagnosis not present

## 2020-03-09 DIAGNOSIS — R0789 Other chest pain: Secondary | ICD-10-CM | POA: Diagnosis not present

## 2020-03-09 DIAGNOSIS — Z1211 Encounter for screening for malignant neoplasm of colon: Secondary | ICD-10-CM | POA: Diagnosis not present

## 2020-03-10 DIAGNOSIS — F4310 Post-traumatic stress disorder, unspecified: Secondary | ICD-10-CM | POA: Diagnosis not present

## 2020-03-10 DIAGNOSIS — F3112 Bipolar disorder, current episode manic without psychotic features, moderate: Secondary | ICD-10-CM | POA: Diagnosis not present

## 2020-03-19 DIAGNOSIS — R140 Abdominal distension (gaseous): Secondary | ICD-10-CM | POA: Diagnosis not present

## 2020-03-19 DIAGNOSIS — R109 Unspecified abdominal pain: Secondary | ICD-10-CM | POA: Diagnosis not present

## 2020-03-19 DIAGNOSIS — Z8041 Family history of malignant neoplasm of ovary: Secondary | ICD-10-CM | POA: Diagnosis not present

## 2020-03-19 DIAGNOSIS — R7989 Other specified abnormal findings of blood chemistry: Secondary | ICD-10-CM | POA: Diagnosis not present

## 2020-03-24 ENCOUNTER — Ambulatory Visit (INDEPENDENT_AMBULATORY_CARE_PROVIDER_SITE_OTHER): Payer: Medicare Other | Admitting: Cardiovascular Disease

## 2020-03-24 ENCOUNTER — Other Ambulatory Visit: Payer: Self-pay | Admitting: Cardiovascular Disease

## 2020-03-24 ENCOUNTER — Other Ambulatory Visit: Payer: Self-pay

## 2020-03-24 ENCOUNTER — Encounter: Payer: Self-pay | Admitting: Cardiovascular Disease

## 2020-03-24 VITALS — BP 136/78 | HR 55 | Ht 65.0 in | Wt 200.0 lb

## 2020-03-24 DIAGNOSIS — R140 Abdominal distension (gaseous): Secondary | ICD-10-CM | POA: Diagnosis not present

## 2020-03-24 DIAGNOSIS — I10 Essential (primary) hypertension: Secondary | ICD-10-CM | POA: Diagnosis not present

## 2020-03-24 DIAGNOSIS — I25118 Atherosclerotic heart disease of native coronary artery with other forms of angina pectoris: Secondary | ICD-10-CM | POA: Diagnosis not present

## 2020-03-24 DIAGNOSIS — I200 Unstable angina: Secondary | ICD-10-CM

## 2020-03-24 DIAGNOSIS — E785 Hyperlipidemia, unspecified: Secondary | ICD-10-CM | POA: Diagnosis not present

## 2020-03-24 DIAGNOSIS — I2510 Atherosclerotic heart disease of native coronary artery without angina pectoris: Secondary | ICD-10-CM

## 2020-03-24 DIAGNOSIS — Z9861 Coronary angioplasty status: Secondary | ICD-10-CM

## 2020-03-24 DIAGNOSIS — F3170 Bipolar disorder, currently in remission, most recent episode unspecified: Secondary | ICD-10-CM | POA: Diagnosis not present

## 2020-03-24 DIAGNOSIS — I25700 Atherosclerosis of coronary artery bypass graft(s), unspecified, with unstable angina pectoris: Secondary | ICD-10-CM | POA: Diagnosis not present

## 2020-03-24 MED ORDER — AMLODIPINE BESYLATE 2.5 MG PO TABS
2.5000 mg | ORAL_TABLET | Freq: Every day | ORAL | 3 refills | Status: DC
Start: 2020-03-24 — End: 2020-04-15

## 2020-03-24 NOTE — Progress Notes (Signed)
03/24/2020 [name]   1960-11-27  [id number]  Primary Physician Doreene Nest, [name], MD Primary Cardiologist: Lorretta Harp MD FACP, [place], [place], [place]  HPI:  [name] is a 59 y.o.  severely overweight divorced Caucasian female mother of 2, grandmother and 3 grandchildren who I last saw  in the office 08/26/2019. Her mother, [name] , as also patient of mine.She is currently in school getting her social workers degree an [employer].She has a history of RCA stenting by Dr. Dr. Doylene Canard in the past. She had coronary artery bypass grafting November 2008 with LIMA to LAD, vein to ramus branch and PDA. She recently stopped smoking 4 months ago. Her palms include hypertension, hyperlipidemia and obstructive sleep apnea intolerant to see. I catheterized her 03/04/10 revealing patent grafts and normal LV function. Her last Myoview performed 04/18/11 with nonischemic. She was experiencing a lot of stress in her life and developed chest pain. She saw Truitt Merle, nurse practitioner on 02/11/16. At that time, she was up for cardiac catheterization which was performed 4 days later by Dr. Peter Martinique. He demonstrated widely patent grafts, normal LV function and unchanged anatomy compared to her cath performed in 2011.  She was admitted to the hospital with unstable angina 06/20/2019 and discharged 5 days later.  I performed cardiac catheterization on her 06/23/2019 revealing a high-grade distal PDA SVG stenosis which I stented.  She is complicated by no reflow requiring additional stenting and Aggrastat.    She has stopped smoking.  Unfortunately she was readmitted to the hospital with a non-STEMI 03/05/2020 underwent diagnostic coronary angiography by Dr. Pernell Dupre revealing occluded RCA vein graft, the vessel which I had stented in November of last year.  She did have left-to-right collaterals.  Echo showed normal LV function.  Her medicines were adjusted, her ranolazine was increased.  She  continues to have effort angina on a daily basis.   Current Meds  Medication Sig  . albuterol (PROVENTIL HFA;VENTOLIN HFA) 108 (90 BASE) MCG/ACT inhaler Inhale 2 puffs into the lungs every 6 (six) hours as needed for wheezing or shortness of breath.   . Alirocumab (PRALUENT) 150 MG/ML SOAJ Inject 1 pen into the skin every 14 (fourteen) days.  Marland Kitchen aspirin 81 MG chewable tablet Chew 81 mg by mouth daily.  Marland Kitchen atorvastatin (LIPITOR) 80 MG tablet Take 1 tablet (80 mg total) by mouth daily. Need to make an appt for overdue visit  . BREO ELLIPTA 100-25 MCG/INH AEPB Inhale 1 puff into the lungs daily.   . brimonidine (ALPHAGAN) 0.15 % ophthalmic solution Place 1 drop into both eyes 2 (two) times daily.   . cholecalciferol (VITAMIN D3) 25 MCG (1000 UNIT) tablet Take 1,000 Units by mouth daily.  . clopidogrel (PLAVIX) 75 MG tablet Take 1 tablet (75 mg total) by mouth daily with breakfast.  . escitalopram (LEXAPRO) 10 MG tablet Take 10 mg by mouth daily.  . fluticasone (FLONASE) 50 MCG/ACT nasal spray Place 1 spray into both nostrils daily.  . isosorbide mononitrate (IMDUR) 30 MG 24 hr tablet Take 3 tablets (90 mg total) by mouth daily.  Marland Kitchen lamoTRIgine (LAMICTAL) 150 MG tablet Take 150 mg by mouth daily.   . metoprolol tartrate (LOPRESSOR) 25 MG tablet Take 0.5 tablets (12.5 mg total) by mouth 2 (two) times daily.  . niacin (NIASPAN) 1000 MG CR tablet TAKE 1 TABLET (1,000 MG TOTAL) BY MOUTH AT BEDTIME.  . nitroGLYCERIN (NITROSTAT) 0.4 MG SL tablet Place 1  tablet (0.4 mg total) under the tongue every 5 (five) minutes as needed for chest pain.  Marland Kitchen ondansetron (ZOFRAN-ODT) 8 MG disintegrating tablet Take 8 mg by mouth every 8 (eight) hours as needed for nausea or vomiting.   Marland Kitchen QUEtiapine (SEROQUEL) 200 MG tablet Take 125 mg by mouth at bedtime. Takes 1 1/4 tablet (125 mg)  . ranolazine (RANEXA) 500 MG 12 hr tablet Take 1 tablet (500 mg total) by mouth 2 (two) times daily.  Marland Kitchen VASCEPA 1 g capsule TAKE 2 CAPSULES  (2 G TOTAL) BY MOUTH 2 (TWO) TIMES DAILY.  . vitamin B-12 (CYANOCOBALAMIN) 1000 MCG tablet Take 1,000 mcg by mouth daily.     Allergies  Allergen Reactions  . Benadryl [Diphenhydramine Hcl] Shortness Of Breath and Swelling    Tongue swelling  . Codeine Other (See Comments)    "makes me psychotic"   . Haldol [Haloperidol Decanoate] Anaphylaxis  . Amoxicillin Hives and Swelling    Swelling to extremities Did it involve swelling of the face/tongue/throat, SOB, or low BP? Yes Did it involve sudden or severe rash/hives, skin peeling, or any reaction on the inside of your mouth or nose? Yes Did you need to seek medical attention at a hospital or doctor's office? Yes When did it last happen?young adult If all above answers are "NO", may proceed with cephalosporin use.  . Bee Venom Swelling and Other (See Comments)    "pimples" from head to toe and whole body swelling    Social History   Socioeconomic History  . Marital status: Divorced    Spouse name: Not on file  . Number of children: Not on file  . Years of education: Not on file  . Highest education level: Not on file  Occupational History  . Not on file  Tobacco Use  . Smoking status: Former Smoker    Packs/day: 0.00    Years: 0.00    Pack years: 0.00    Quit date: 05/21/2015    Years since quitting: 4.8  . Smokeless tobacco: Never Used  . Tobacco comment: pt states she quit a week ago 06/14/19  Substance and Sexual Activity  . Alcohol use: No  . Drug use: No  . Sexual activity: Not on file  Other Topics Concern  . Not on file  Social History Narrative  . Not on file   Social Determinants of Health   Financial Resource Strain:   . Difficulty of Paying Living Expenses:   Food Insecurity:   . Worried About Charity fundraiser in the Last Year:   . Arboriculturist in the Last Year:   Transportation Needs:   . Film/video editor (Medical):   Marland Kitchen Lack of Transportation (Non-Medical):   Physical Activity:     . Days of Exercise per Week:   . Minutes of Exercise per Session:   Stress:   . Feeling of Stress :   Social Connections:   . Frequency of Communication with Friends and Family:   . Frequency of Social Gatherings with Friends and Family:   . Attends Religious Services:   . Active Member of Clubs or Organizations:   . Attends Archivist Meetings:   Marland Kitchen Marital Status:   Intimate Partner Violence:   . Fear of Current or Ex-Partner:   . Emotionally Abused:   Marland Kitchen Physically Abused:   . Sexually Abused:      Review of Systems: General: negative for chills, fever, night sweats or weight changes.  Cardiovascular: negative for chest pain, dyspnea on exertion, edema, orthopnea, palpitations, paroxysmal nocturnal dyspnea or shortness of breath Dermatological: negative for rash Respiratory: negative for cough or wheezing Urologic: negative for hematuria Abdominal: negative for nausea, vomiting, diarrhea, bright red blood per rectum, melena, or hematemesis Neurologic: negative for visual changes, syncope, or dizziness All other systems reviewed and are otherwise negative except as noted above.    Blood pressure 136/78, pulse (!) 55, height 5\' 5"  (1.651 m), weight 200 lb (90.7 kg), last menstrual period 11/16/2011.  General appearance: alert and no distress Neck: no adenopathy, no carotid bruit, no JVD, supple, symmetrical, trachea midline and thyroid not enlarged, symmetric, no tenderness/mass/nodules Lungs: clear to auscultation bilaterally Heart: regular rate and rhythm, S1, S2 normal, no murmur, click, rub or gallop Extremities: extremities normal, atraumatic, no cyanosis or edema Pulses: 2+ and symmetric Skin: Skin color, texture, turgor normal. No rashes or lesions Neurologic: Alert and oriented X 3, normal strength and tone. Normal symmetric reflexes. Normal coordination and gait  EKG sinus bradycardia 55 with inferior Q waves, early R wave transition and nonspecific ST and  T wave changes.  I personally reviewed this EKG.  ASSESSMENT AND PLAN:   Hyperlipidemia LDL goal <70 History of hyperlipidemia on statin therapy and Praluent with lipid profile performed 10/22/2019 revealing total cholesterol 117, LDL 41 and HDL of 55.  Coronary artery disease involving coronary bypass graft History of CAD status post CABG times 24 June 2007 with a LIMA to the LAD, vein to the ramus branch and PDA.  I cathed her in the setting of unstable angina 06/23/2019 revealing high-grade distal PDA SVG stenosis which I stented.  It was complicated by no reflow requiring additional stenting and Aggrastat.  She had a patent but diffusely diseased vein to the ramus branch and a patent LIMA.  She has stopped smoking.  She was admitted to the hospital with a non-STEMI 03/05/20 and underwent diagnostic coronary angiography by Dr. Pernell Dupre revealing an occluded vein to the PDA with left-to-right collaterals otherwise unchanged anatomy.  Medical therapy was recommended.  Her medicines were adjusted.  Ranolazine was doubled.  She still has effort angina on a daily basis.  I am going to add low-dose amlodipine 2.5 mg and will follow her closely as an outpatient.  Essential hypertension History of essential hypertension blood pressure measured today 136/78.  She is on metoprolol.      Lorretta Harp MD FACP,FACC,FAHA, [facility] 03/24/2020 9:45 AM

## 2020-03-24 NOTE — Assessment & Plan Note (Signed)
History of CAD status post CABG times 24 June 2007 with a LIMA to the LAD, vein to the ramus branch and PDA.  I cathed her in the setting of unstable angina 06/23/2019 revealing high-grade distal PDA SVG stenosis which I stented.  It was complicated by no reflow requiring additional stenting and Aggrastat.  She had a patent but diffusely diseased vein to the ramus branch and a patent LIMA.  She has stopped smoking.  She was admitted to the hospital with a non-STEMI 03/05/20 and underwent diagnostic coronary angiography by Dr. Pernell Dupre revealing an occluded vein to the PDA with left-to-right collaterals otherwise unchanged anatomy.  Medical therapy was recommended.  Her medicines were adjusted.  Ranolazine was doubled.  She still has effort angina on a daily basis.  I am going to add low-dose amlodipine 2.5 mg and will follow her closely as an outpatient.

## 2020-03-24 NOTE — Patient Instructions (Signed)
Medication Instructions:  Your physician has recommended you make the following change in your medication: START amlodipine 2.5mg  daily  *If you need a refill on your cardiac medications before your next appointment, please call your pharmacy*  Follow-Up: At [facility], you and your health needs are our priority.  As part of our continuing mission to provide you with exceptional heart care, we have created designated Provider Care Teams.  These Care Teams include your primary Cardiologist (physician) and Advanced Practice Providers (APPs -  Physician Assistants and Nurse Practitioners) who all work together to provide you with the care you need, when you need it.  We recommend signing up for the patient portal called "MyChart".  Sign up information is provided on this After Visit Summary.  MyChart is used to connect with patients for Virtual Visits (Telemedicine).  Patients are able to view lab/test results, encounter notes, upcoming appointments, etc.  Non-urgent messages can be sent to your provider as well.   To learn more about what you can do with MyChart, go to [email or web address].    Your next appointment:   3 month(s) - In Person - with APP on Dr. Kennon Holter care team  Kerin Ransom, PA-C  Sande Rives, PA-C  Coletta Memos, FNP   6 month(s) - In Person - with Dr. Gwenlyn Found

## 2020-03-24 NOTE — Assessment & Plan Note (Signed)
History of essential hypertension blood pressure measured today 136/78.  She is on metoprolol.

## 2020-03-24 NOTE — Assessment & Plan Note (Addendum)
History of hyperlipidemia on statin therapy and Praluent with lipid profile performed 10/22/2019 revealing total cholesterol 117, LDL 41 and HDL of 55.

## 2020-03-26 ENCOUNTER — Telehealth (HOSPITAL_COMMUNITY): Payer: Self-pay | Admitting: *Deleted

## 2020-03-26 NOTE — Telephone Encounter (Signed)
-----   Message from Lorretta Harp, MD sent at 03/25/2020  5:21 PM EDT ----- Regarding: RE: ?? okay to proceed with Cardiac rehab I have no problem with her having supervised exercise.  I do not want her to develop exercise-induced angina so her METS  should be reduced to below the point where she develops angina. ----- Message ----- From: Rowe Pavy, RN Sent: 03/25/2020  10:33 AM EDT To: Lorretta Harp, MD Subject: ?? okay to proceed with Cardiac rehab           Dr. Gwenlyn Found,  The above pt seen by you on 8/4 s/p Nstemi medically treat is interested in participating in Cardiac rehab.  Per your note, pt is having effort angina.  Amlodipine was added and Ranolazine was doubled for anginal management.  Monitoring closely as outpatient.  Pt next follow up appt is in November. Based upon your assessment, is this pt stable for group exercise at cardiac rehab?  If so, what is an acceptable plan of care for expected angina during exercise?   Thanks so much for your advisement Maurice Small RN, BSN Cardiac and Pulmonary Rehab Nurse Navigator

## 2020-04-15 ENCOUNTER — Ambulatory Visit (INDEPENDENT_AMBULATORY_CARE_PROVIDER_SITE_OTHER): Payer: Medicare Other | Admitting: Gastroenterology

## 2020-04-15 ENCOUNTER — Encounter: Payer: Self-pay | Admitting: Gastroenterology

## 2020-04-15 VITALS — BP 120/82 | HR 62 | Ht 65.0 in | Wt 203.0 lb

## 2020-04-15 DIAGNOSIS — K5909 Other constipation: Secondary | ICD-10-CM | POA: Insufficient documentation

## 2020-04-15 DIAGNOSIS — R140 Abdominal distension (gaseous): Secondary | ICD-10-CM | POA: Diagnosis not present

## 2020-04-15 NOTE — Progress Notes (Signed)
Agree with assessment and plan as outlined.  

## 2020-04-15 NOTE — Patient Instructions (Signed)
If you are age 58 or older, your body mass index should be between 23-30. Your Body mass index is 33.78 kg/m. If this is out of the aforementioned range listed, please consider follow up with your Primary Care Provider.  If you are age 29 or younger, your body mass index should be between 19-25. Your Body mass index is 33.78 kg/m. If this is out of the aformentioned range listed, please consider follow up with your Primary Care Provider.   We have given you samples of the following medication to take: Linzess 145 mcg daily.  Call back in 10 days with an update ask for Koren Shiver, RN.

## 2020-04-15 NOTE — Progress Notes (Signed)
04/15/2020 [name] [id number] [birth year]   HISTORY OF PRESENT ILLNESS: This is a 59 year old female who is new to our office.  She has been referred here by [name], PA-C, for evaluation of atypical chest pain.  The patient has coronary artery disease with history of CABG and then had a cardiac stent placed in November 2020.  Just had an NSTEMI in July.  She is on Plavix.  She says that she was very surprised as to the reason that the referral was placed.  She says that she knows that her chest pain is chronic angina and does not think it is related to acid reflux.  She says that she knows that she does not have acid reflux and that she knows her body.  What she does complain of is chronic, lifelong/longstanding bloating and constipation.  She says that she has been dealing with this at least half of her life.  She says that she only has at most sometimes 3 or 4 bowel movements a month.  She has battled with constipation since her early 20s.  This is associated with generalized abdominal bloating and discomfort.  She says she tried several over-the-counter regimens including MiraLAX.  For the most part she does not have any rectal bleeding, but recently had a large hard stool with some associated bright red blood.  Past Medical History:  Diagnosis Date  . Anemia    takes iron supplement  . Arthritis    knees, shoulders  . Bipolar 1 disorder ([place])   . Cataract, immature   . CHF (congestive heart failure) ([place])   . COPD (chronic obstructive pulmonary disease) ([place])   . Coronary artery disease    triple vessel  . Family history of adverse reaction to anesthesia    states father "did not do well" with anesthesia; died last time he had surgery; also had heart disease  . Gallstones   . GERD (gastroesophageal reflux disease)    occasional - no current med.  . Glaucoma   . Hammertoe 05/2015   right second toe  . Hepatic steatosis   . High cholesterol   . History of MI  (myocardial infarction)   . Hypertension    states under control with meds., has been on med. x 8 yr.  . Panic attacks   . Peripheral vascular disease ([place])    carotid  . Renal cyst, left   . Schizophrenia ([place])   . Short of breath on exertion   . Sleep apnea    no CPAP use  . Urinary, incontinence, stress female   . Wears dentures    upper  . Wears partial dentures    lower   Past Surgical History:  Procedure Laterality Date  . C-SECTIONS X 2  1985, 1988  . CARDIAC CATHETERIZATION  12/10/2006; 12/18/2006; 07/10/2007; 07/19/2009  . CARDIAC CATHETERIZATION  03/04/2010   patent grafts, normal LV function, diffusely diseased RCA (Dr. Adora Fridge)  . CARDIAC CATHETERIZATION N/A 02/15/2016   Procedure: Left Heart Cath and Cors/Grafts Angiography;  Surgeon: Peter M Martinique, MD;  Location: [facility];  Service: Cardiovascular;  Laterality: N/A;  . CLUB FOOT RELEASE Bilateral    1st and 2nd toes  . CORONARY ARTERY BYPASS GRAFT  07/16/2007   LIMA to LAD, VG to ramus, VG to PDA   . CORONARY STENT INTERVENTION N/A 06/23/2019   Procedure: CORONARY STENT INTERVENTION;  Surgeon: Lorretta Harp, MD;  Location: [facility];  Service: Cardiovascular;  Laterality: N/A;  . FLEXIBLE BRONCHOSCOPY  07/16/2007  . HAMMERTOE RECONSTRUCTION WITH WEIL OSTEOTOMY Right 06/03/2015   Procedure: RIGHT SECOND HAMMERTOE CORRECTION WITH MT WEIL OSTEOTOMY;  Surgeon: Wylene Simmer, MD;  Location: [facility];  Service: Orthopedics;  Laterality: Right;  . HAND SURGERY Bilateral    1st and 2nd fingers - release of clubbing  . KNEE ARTHROSCOPY WITH ANTERIOR CRUCIATE LIGAMENT (ACL) REPAIR Right 01/30/2008  . LEFT HEART CATH AND CORS/GRAFTS ANGIOGRAPHY N/A 06/23/2019   Procedure: LEFT HEART CATH AND CORS/GRAFTS ANGIOGRAPHY;  Surgeon: Lorretta Harp, MD;  Location: [facility];  Service: Cardiovascular;  Laterality: N/A;  . LEFT HEART CATH AND CORS/GRAFTS ANGIOGRAPHY N/A 03/01/2020    Procedure: LEFT HEART CATH AND CORS/GRAFTS ANGIOGRAPHY;  Surgeon: Belva Crome, MD;  Location: [facility];  Service: Cardiovascular;  Laterality: N/A;  . NOVASURE ABLATION  12/19/2007   with removal of IUD  . ORIF FOOT FRACTURE Left   . TOE SURGERIES TO REMOVE NAILS DUE TO CLUBBING    . TOTAL KNEE ARTHROPLASTY Right 10/21/2012   Procedure: TOTAL KNEE ARTHROPLASTY;  Surgeon: Mauri Pole, MD;  Location: [facility];  Service: Orthopedics;  Laterality: Right;    reports that she quit smoking about 4 years ago. She smoked 0.00 packs per day for 0.00 years. She has never used smokeless tobacco. She reports that she does not drink alcohol and does not use drugs. family history includes Anesthesia problems in her father; Heart disease in her father; Lung cancer in her mother. Allergies  Allergen Reactions  . Benadryl [Diphenhydramine Hcl] Shortness Of Breath and Swelling    Tongue swelling  . Codeine Other (See Comments)    "makes me psychotic"   . Haldol [Haloperidol Decanoate] Anaphylaxis  . Amoxicillin Hives and Swelling    Swelling to extremities Did it involve swelling of the face/tongue/throat, SOB, or low BP? Yes Did it involve sudden or severe rash/hives, skin peeling, or any reaction on the inside of your mouth or nose? Yes Did you need to seek medical attention at a hospital or doctor's office? Yes When did it last happen?young adult If all above answers are "NO", may proceed with cephalosporin use.  . Bee Venom Swelling and Other (See Comments)    "pimples" from head to toe and whole body swelling      Outpatient Encounter Medications as of 04/15/2020  Medication Sig  . albuterol (PROVENTIL HFA;VENTOLIN HFA) 108 (90 BASE) MCG/ACT inhaler Inhale 2 puffs into the lungs every 6 (six) hours as needed for wheezing or shortness of breath.   . Alirocumab (PRALUENT) 150 MG/ML SOAJ Inject 1 pen into the skin every 14 (fourteen) days.  Marland Kitchen aspirin 81 MG chewable tablet Chew 81 mg  by mouth daily.  Marland Kitchen atorvastatin (LIPITOR) 80 MG tablet Take 1 tablet (80 mg total) by mouth daily. Need to make an appt for overdue visit  . BREO ELLIPTA 100-25 MCG/INH AEPB Inhale 1 puff into the lungs daily.   . brimonidine (ALPHAGAN) 0.15 % ophthalmic solution Place 1 drop into both eyes 2 (two) times daily.   . cholecalciferol (VITAMIN D3) 25 MCG (1000 UNIT) tablet Take 1,000 Units by mouth daily.  . clopidogrel (PLAVIX) 75 MG tablet Take 1 tablet (75 mg total) by mouth daily with breakfast.  . escitalopram (LEXAPRO) 10 MG tablet Take 10 mg by mouth daily.  . fluticasone (FLONASE) 50 MCG/ACT nasal spray Place 1 spray into both nostrils daily.  Marland Kitchen  lamoTRIgine (LAMICTAL) 150 MG tablet Take 150 mg by mouth daily.   . metoprolol tartrate (LOPRESSOR) 25 MG tablet Take 0.5 tablets (12.5 mg total) by mouth 2 (two) times daily.  . niacin (NIASPAN) 1000 MG CR tablet TAKE 1 TABLET (1,000 MG TOTAL) BY MOUTH AT BEDTIME.  . nitroGLYCERIN (NITROSTAT) 0.4 MG SL tablet Place 1 tablet (0.4 mg total) under the tongue every 5 (five) minutes as needed for chest pain.  Marland Kitchen ondansetron (ZOFRAN-ODT) 8 MG disintegrating tablet Take 8 mg by mouth every 8 (eight) hours as needed for nausea or vomiting.   Marland Kitchen QUEtiapine (SEROQUEL) 200 MG tablet Take 125 mg by mouth at bedtime. Takes 1 1/4 tablet (125 mg)  . VASCEPA 1 g capsule TAKE 2 CAPSULES (2 G TOTAL) BY MOUTH 2 (TWO) TIMES DAILY.  . vitamin B-12 (CYANOCOBALAMIN) 1000 MCG tablet Take 1,000 mcg by mouth daily.  . isosorbide mononitrate (IMDUR) 30 MG 24 hr tablet Take 3 tablets (90 mg total) by mouth daily.  . [DISCONTINUED] amLODipine (NORVASC) 2.5 MG tablet Take 1 tablet (2.5 mg total) by mouth daily.  . [DISCONTINUED] ranolazine (RANEXA) 500 MG 12 hr tablet Take 1 tablet (500 mg total) by mouth 2 (two) times daily.   No facility-administered encounter medications on file as of 04/15/2020.    REVIEW OF SYSTEMS  : All other systems reviewed and negative except where  noted in the History of Present Illness.   PHYSICAL EXAM: BP 120/82   Pulse 62   Ht 5\' 5"  (1.651 m)   Wt 203 lb (92.1 kg)   LMP 11/16/2011   BMI 33.78 kg/m  General: Well developed white female in no acute distress Head: Normocephalic and atraumatic Eyes:  Sclerae anicteric, conjunctiva pink. Ears: Normal auditory acuity Lungs: Clear throughout to auscultation; no increased WOB. Heart: Regular rate and rhythm; no M/R/G. Abdomen: Soft, non-distended.  BS present.  Mild diffuse TTP. Musculoskeletal: Symmetrical with no gross deformities  Skin: No lesions on visible extremities Extremities: No edema  Neurological: Alert oriented x 4, grossly non-focal Psychological:  Alert and cooperative. Normal mood and affect  ASSESSMENT AND PLAN: *Chronic, longstanding constipation and associated abdominal bloating.  Has tried multiple over-the-counter regimens without success.  We will start with Linzess 145 mcg daily.  Samples will be given.  She will try this for 10 days and then call us back with an update.  We will determine if we will continue the same dose versus increase or decrease the dose and send a prescription.  She will need a colonoscopy as she has never had one in the past, but had cardiac stent placed in November 2020 and then just had an NSTEMI in July.  Will hold off until at least November when it be safer to hold her Plavix. *Atypical chest pain: Patient tells me that she is sure that this is chronic angina pain.  She says that she does not have acid reflux and does not think that it is related to her GI tract.   CC:  Loyola Mast, PA-C

## 2020-04-16 ENCOUNTER — Other Ambulatory Visit: Payer: Self-pay | Admitting: Cardiovascular Disease

## 2020-04-23 ENCOUNTER — Telehealth (HOSPITAL_COMMUNITY): Payer: Self-pay

## 2020-04-23 NOTE — Telephone Encounter (Signed)
Called patient to see if she was interested in participating in the Cardiac Rehab Program. Patient stated yes. Patient will come in for orientation on 05/20/20 @ 130PM and will attend the 1:15PM exercise class.  Mailed letter

## 2020-05-10 ENCOUNTER — Telehealth (HOSPITAL_COMMUNITY): Payer: Self-pay | Admitting: Student-PharmD

## 2020-05-10 NOTE — Telephone Encounter (Signed)
Cardiac Rehab - Pharmacy Resident Documentation   Patient unable to be reached after three call attempts. Please complete allergy verification and medication review during patient's cardiac rehab appointment.    Mercy Riding, PharmD PGY1 Acute Care Pharmacy Resident Please refer to [facility] for unit-specific pharmacist

## 2020-05-20 ENCOUNTER — Telehealth (HOSPITAL_COMMUNITY): Payer: Self-pay

## 2020-05-20 ENCOUNTER — Ambulatory Visit (HOSPITAL_COMMUNITY): Payer: Medicare Other

## 2020-05-20 NOTE — Telephone Encounter (Signed)
Cardiac Rehab Note:  Unsuccessful telephone call to [name] to follow up on today's missed cardiac rehab orientation appointment. [employer] compliant VM message left requesting call back to [phone]. Today's appointment cancelled and will be rescheduled in the event patient returns call.  [name] E. Rollene Rotunda RN, BSN [place]. [facility]  Cardiac and Pulmonary Rehabilitation Phone: [phone] Fax: [phone]

## 2020-05-24 ENCOUNTER — Ambulatory Visit (HOSPITAL_COMMUNITY): Payer: Medicare Other

## 2020-05-24 ENCOUNTER — Telehealth (HOSPITAL_COMMUNITY): Payer: Self-pay

## 2020-05-24 ENCOUNTER — Encounter (HOSPITAL_COMMUNITY): Payer: Self-pay

## 2020-05-24 NOTE — Telephone Encounter (Signed)
Attempted to call patient in regards to Cardiac Rehab - LM on VM Mailed letter 

## 2020-05-26 ENCOUNTER — Ambulatory Visit (HOSPITAL_COMMUNITY): Payer: Medicare Other

## 2020-05-28 ENCOUNTER — Ambulatory Visit (HOSPITAL_COMMUNITY): Payer: Medicare Other

## 2020-05-31 ENCOUNTER — Ambulatory Visit (HOSPITAL_COMMUNITY): Payer: Medicare Other

## 2020-06-02 ENCOUNTER — Ambulatory Visit (HOSPITAL_COMMUNITY): Payer: Medicare Other

## 2020-06-04 ENCOUNTER — Ambulatory Visit (HOSPITAL_COMMUNITY): Payer: Medicare Other

## 2020-06-07 ENCOUNTER — Ambulatory Visit (HOSPITAL_COMMUNITY): Payer: Medicare Other

## 2020-06-09 ENCOUNTER — Ambulatory Visit (HOSPITAL_COMMUNITY): Payer: Medicare Other

## 2020-06-09 ENCOUNTER — Telehealth (HOSPITAL_COMMUNITY): Payer: Self-pay

## 2020-06-09 NOTE — Telephone Encounter (Signed)
Unable to reach pt in regards to Cardiac Rehab Closed referral 

## 2020-06-11 ENCOUNTER — Ambulatory Visit (HOSPITAL_COMMUNITY): Payer: Medicare Other

## 2020-06-14 ENCOUNTER — Ambulatory Visit (HOSPITAL_COMMUNITY): Payer: Medicare Other

## 2020-06-16 ENCOUNTER — Ambulatory Visit (HOSPITAL_COMMUNITY): Payer: Medicare Other

## 2020-06-18 ENCOUNTER — Ambulatory Visit (HOSPITAL_COMMUNITY): Payer: Medicare Other

## 2020-06-21 ENCOUNTER — Ambulatory Visit (HOSPITAL_COMMUNITY): Payer: Medicare Other

## 2020-06-21 ENCOUNTER — Other Ambulatory Visit: Payer: Self-pay | Admitting: Cardiovascular Disease

## 2020-06-23 ENCOUNTER — Ambulatory Visit (HOSPITAL_COMMUNITY): Payer: Medicare Other

## 2020-06-24 ENCOUNTER — Ambulatory Visit: Payer: Medicare Other | Admitting: Cardiology

## 2020-06-25 ENCOUNTER — Ambulatory Visit (HOSPITAL_COMMUNITY): Payer: Medicare Other

## 2020-06-28 ENCOUNTER — Ambulatory Visit (HOSPITAL_COMMUNITY): Payer: Medicare Other

## 2020-06-30 ENCOUNTER — Ambulatory Visit (HOSPITAL_COMMUNITY): Payer: Medicare Other

## 2020-07-02 ENCOUNTER — Ambulatory Visit (HOSPITAL_COMMUNITY): Payer: Medicare Other

## 2020-07-05 ENCOUNTER — Ambulatory Visit (HOSPITAL_COMMUNITY): Payer: Medicare Other

## 2020-07-07 ENCOUNTER — Ambulatory Visit (HOSPITAL_COMMUNITY): Payer: Medicare Other

## 2020-07-07 DIAGNOSIS — F4310 Post-traumatic stress disorder, unspecified: Secondary | ICD-10-CM | POA: Diagnosis not present

## 2020-07-07 DIAGNOSIS — F3112 Bipolar disorder, current episode manic without psychotic features, moderate: Secondary | ICD-10-CM | POA: Diagnosis not present

## 2020-07-08 NOTE — Progress Notes (Deleted)
error 

## 2020-07-09 ENCOUNTER — Ambulatory Visit (HOSPITAL_COMMUNITY): Payer: Medicare Other

## 2020-07-12 ENCOUNTER — Ambulatory Visit (HOSPITAL_COMMUNITY): Payer: Medicare Other

## 2020-07-13 ENCOUNTER — Ambulatory Visit: Payer: Medicare Other | Admitting: General Practice

## 2020-07-14 ENCOUNTER — Ambulatory Visit (HOSPITAL_COMMUNITY): Payer: Medicare Other

## 2020-08-05 ENCOUNTER — Other Ambulatory Visit: Payer: Self-pay | Admitting: Interventional Cardiology

## 2020-08-05 DIAGNOSIS — E785 Hyperlipidemia, unspecified: Secondary | ICD-10-CM

## 2020-08-05 DIAGNOSIS — I10 Essential (primary) hypertension: Secondary | ICD-10-CM

## 2020-11-05 ENCOUNTER — Ambulatory Visit (INDEPENDENT_AMBULATORY_CARE_PROVIDER_SITE_OTHER): Payer: Medicare Other | Admitting: Cardiovascular Disease

## 2020-11-05 ENCOUNTER — Encounter: Payer: Self-pay | Admitting: Cardiovascular Disease

## 2020-11-05 ENCOUNTER — Other Ambulatory Visit: Payer: Self-pay

## 2020-11-05 VITALS — BP 158/88 | HR 64 | Ht 65.5 in | Wt 209.6 lb

## 2020-11-05 DIAGNOSIS — E785 Hyperlipidemia, unspecified: Secondary | ICD-10-CM

## 2020-11-05 DIAGNOSIS — Z951 Presence of aortocoronary bypass graft: Secondary | ICD-10-CM | POA: Diagnosis not present

## 2020-11-05 DIAGNOSIS — I10 Essential (primary) hypertension: Secondary | ICD-10-CM

## 2020-11-05 DIAGNOSIS — I25810 Atherosclerosis of coronary artery bypass graft(s) without angina pectoris: Secondary | ICD-10-CM | POA: Diagnosis not present

## 2020-11-05 DIAGNOSIS — I25708 Atherosclerosis of coronary artery bypass graft(s), unspecified, with other forms of angina pectoris: Secondary | ICD-10-CM

## 2020-11-05 MED ORDER — METOPROLOL TARTRATE 25 MG PO TABS: 12.5000 mg | ORAL_TABLET | Freq: Two times a day (BID) | ORAL | 3 refills | Status: DC

## 2020-11-05 MED ORDER — AMLODIPINE BESYLATE 2.5 MG PO TABS: 2.5000 mg | ORAL_TABLET | Freq: Every day | ORAL | 3 refills | Status: DC

## 2020-11-05 NOTE — Patient Instructions (Signed)
Medication Instructions:  Your physician recommends that you continue on your current medications as directed. Please refer to the Current Medication list given to you today.  *If you need a refill on your cardiac medications before your next appointment, please call your pharmacy*   Follow-Up: At [facility], you and your health needs are our priority.  As part of our continuing mission to provide you with exceptional heart care, we have created designated Provider Care Teams.  These Care Teams include your primary Cardiologist (physician) and Advanced Practice Providers (APPs -  Physician Assistants and Nurse Practitioners) who all work together to provide you with the care you need, when you need it.  We recommend signing up for the patient portal called "MyChart".  Sign up information is provided on this After Visit Summary.  MyChart is used to connect with patients for Virtual Visits (Telemedicine).  Patients are able to view lab/test results, encounter notes, upcoming appointments, etc.  Non-urgent messages can be sent to your provider as well.   To learn more about what you can do with MyChart, go to [email or web address].    Your next appointment:   6 month(s)  The format for your next appointment:   In Person  Provider:   You will see one of the following Advanced Practice Providers on your designated Care Team:    Callie Goodrich, PA-C  Jesse Cleaver, FNP  Then, Jonathan Berry, MD will plan to see you again in 12 month(s). 

## 2020-11-05 NOTE — Assessment & Plan Note (Signed)
History of hyperlipidemia on Praluent and high-dose atorvastatin as well as niacin and Vascepa with lipid profile performed 10/22/2019 revealing total cholesterol 117, LDL 41 HDL 55 triglyceride level of 117 as well.  She is scheduled to get repeat labs by her PCP today.

## 2020-11-05 NOTE — Addendum Note (Signed)
Addended by: Beatrix Fetters on: 11/05/2020 02:54 PM   Modules accepted: Orders

## 2020-11-05 NOTE — Assessment & Plan Note (Signed)
History of essential hypertension blood pressure measured today at 120/82.  She is on metoprolol.

## 2020-11-05 NOTE — Progress Notes (Signed)
11/05/2020 [name]   [birth year]  [id number]  Primary Physician Doreene Nest, [name], MD Primary Cardiologist: Lorretta Harp MD FACP, [place], [place], [place]  HPI:  [name] is a 60 y.o.  severely overweight divorced Caucasian female mother of 2, grandmother and 3 grandchildren who I last saw in the office 03/24/2020. Her mother, [name] , as also patient of mine.She is currently in school getting her social workers degree an [employer].She has a history of RCA stenting by Dr. Dr. Doylene Canard in the past. She had coronary artery bypass grafting November 2008 with LIMA to LAD, vein to ramus branch and PDA. She recently stopped smoking 4 months ago. Her palms include hypertension, hyperlipidemia and obstructive sleep apnea intolerant to see. I catheterized her 03/04/10 revealing patent grafts and normal LV function. Her last Myoview performed 04/18/11 with nonischemic. She was experiencing a lot of stress in her life and developed chest pain. She saw Truitt Merle, nurse practitioner on 02/11/16. At that time, she was up for cardiac catheterization which was performed 4 days later by Dr. Peter Martinique. He demonstrated widely patent grafts, normal LV function and unchanged anatomy compared to her cath performed in 2011.  She was admitted to the hospital with unstable angina 06/20/2019 and discharged 5 days later. I performed cardiac catheterization on her 06/23/2019 revealing a high-grade distal PDA SVG stenosis which I stented. She is complicated by no reflow requiring additional stenting and Aggrastat.  She has stopped smoking.  Unfortunately she was readmitted to the hospital with a non-STEMI 03/05/2020 underwent diagnostic coronary angiography by Dr. Pernell Dupre revealing occluded RCA vein graft, the vessel which I had stented in November of last year.  She did have left-to-right collaterals.  Echo showed normal LV function.  Her medicines were adjusted, her ranolazine was  increased.  Since I saw her 6 months ago she has stopped smoking 4 months ago.  She says that she is gained weight but her weight is essentially unchanged.  She is having some gastrointestinal issues with bloating and constipation.  Her chest pain has not changed in frequency or severity.  Current Meds  Medication Sig  . albuterol (PROVENTIL HFA;VENTOLIN HFA) 108 (90 BASE) MCG/ACT inhaler Inhale 2 puffs into the lungs every 6 (six) hours as needed for wheezing or shortness of breath.   . Alirocumab (PRALUENT) 150 MG/ML SOAJ Inject 1 pen into the skin every 14 (fourteen) days.  Marland Kitchen amLODipine (NORVASC) 2.5 MG tablet Take 2.5 mg by mouth daily.  Marland Kitchen aspirin 81 MG chewable tablet Chew 81 mg by mouth daily.  Marland Kitchen atorvastatin (LIPITOR) 80 MG tablet Take 1 tablet (80 mg total) by mouth daily.  Marland Kitchen BREO ELLIPTA 100-25 MCG/INH AEPB Inhale 1 puff into the lungs daily.   . brimonidine (ALPHAGAN) 0.15 % ophthalmic solution Place 1 drop into both eyes 2 (two) times daily.   . cholecalciferol (VITAMIN D3) 25 MCG (1000 UNIT) tablet Take 1,000 Units by mouth daily.  . clopidogrel (PLAVIX) 75 MG tablet Take 1 tablet (75 mg total) by mouth daily with breakfast.  . escitalopram (LEXAPRO) 10 MG tablet Take 10 mg by mouth daily.  . fluticasone (FLONASE) 50 MCG/ACT nasal spray Place 1 spray into both nostrils daily.  Marland Kitchen lamoTRIgine (LAMICTAL) 150 MG tablet Take 150 mg by mouth daily.   Marland Kitchen linaclotide (LINZESS) 72 MCG capsule Take by mouth.  . metoprolol tartrate (LOPRESSOR) 25 MG tablet Take 0.5 tablets (12.5 mg total) by mouth 2 (two)  times daily.  . niacin (NIASPAN) 1000 MG CR tablet TAKE 1 TABLET (1,000 MG TOTAL) BY MOUTH AT BEDTIME.  . nitroGLYCERIN (NITROSTAT) 0.4 MG SL tablet Place 1 tablet (0.4 mg total) under the tongue every 5 (five) minutes as needed for chest pain.  Marland Kitchen ondansetron (ZOFRAN-ODT) 8 MG disintegrating tablet Take 8 mg by mouth every 8 (eight) hours as needed for nausea or vomiting.   Marland Kitchen QUEtiapine  (SEROQUEL) 200 MG tablet Take 125 mg by mouth at bedtime. Takes 1 1/4 tablet (125 mg)  . VASCEPA 1 g capsule TAKE 2 CAPSULES (2 G TOTAL) BY MOUTH 2 (TWO) TIMES DAILY.  . vitamin B-12 (CYANOCOBALAMIN) 1000 MCG tablet Take 1,000 mcg by mouth daily.     Allergies  Allergen Reactions  . Benadryl [Diphenhydramine Hcl] Shortness Of Breath and Swelling    Tongue swelling  . Codeine Other (See Comments)    "makes me psychotic"   . Haldol [Haloperidol Decanoate] Anaphylaxis  . Amoxicillin Hives and Swelling    Swelling to extremities Did it involve swelling of the face/tongue/throat, SOB, or low BP? Yes Did it involve sudden or severe rash/hives, skin peeling, or any reaction on the inside of your mouth or nose? Yes Did you need to seek medical attention at a hospital or doctor's office? Yes When did it last happen?young adult If all above answers are "NO", may proceed with cephalosporin use.  . Bee Venom Swelling and Other (See Comments)    "pimples" from head to toe and whole body swelling    Social History   Socioeconomic History  . Marital status: Divorced    Spouse name: Not on file  . Number of children: Not on file  . Years of education: Not on file  . Highest education level: Not on file  Occupational History  . Not on file  Tobacco Use  . Smoking status: Former Smoker    Packs/day: 0.00    Years: 0.00    Pack years: 0.00    Quit date: 05/21/2015    Years since quitting: 5.4  . Smokeless tobacco: Never Used  . Tobacco comment: pt states she quit a week ago 06/14/19  Vaping Use  . Vaping Use: Never used  Substance and Sexual Activity  . Alcohol use: No  . Drug use: No  . Sexual activity: Not on file  Other Topics Concern  . Not on file  Social History Narrative  . Not on file   Social Determinants of Health   Financial Resource Strain: Not on file  Food Insecurity: Not on file  Transportation Needs: Not on file  Physical Activity: Not on file  Stress:  Not on file  Social Connections: Not on file  Intimate Partner Violence: Not on file     Review of Systems: General: negative for chills, fever, night sweats or weight changes.  Cardiovascular: negative for chest pain, dyspnea on exertion, edema, orthopnea, palpitations, paroxysmal nocturnal dyspnea or shortness of breath Dermatological: negative for rash Respiratory: negative for cough or wheezing Urologic: negative for hematuria Abdominal: negative for nausea, vomiting, diarrhea, bright red blood per rectum, melena, or hematemesis Neurologic: negative for visual changes, syncope, or dizziness All other systems reviewed and are otherwise negative except as noted above.    Blood pressure (!) 158/88, pulse 64, height 5' 5.5" (1.664 m), weight 209 lb 9.6 oz (95.1 kg), last menstrual period 11/16/2011, SpO2 97 %.  General appearance: alert and no distress Neck: no adenopathy, no carotid bruit, no JVD, supple,  symmetrical, trachea midline and thyroid not enlarged, symmetric, no tenderness/mass/nodules Lungs: clear to auscultation bilaterally Heart: regular rate and rhythm, S1, S2 normal, no murmur, click, rub or gallop Extremities: extremities normal, atraumatic, no cyanosis or edema Pulses: 2+ and symmetric Skin: Skin color, texture, turgor normal. No rashes or lesions Neurologic: Alert and oriented X 3, normal strength and tone. Normal symmetric reflexes. Normal coordination and gait  EKG sinus rhythm at 64 with LVH voltage and inferior Q waves with nonspecific ST and T wave changes.  I personally reviewed this EKG.  ASSESSMENT AND PLAN:   Hyperlipidemia LDL goal <70 History of hyperlipidemia on Praluent and high-dose atorvastatin as well as niacin and Vascepa with lipid profile performed 10/22/2019 revealing total cholesterol 117, LDL 41 HDL 55 triglyceride level of 117 as well.  She is scheduled to get repeat labs by her PCP today.  Coronary artery disease involving coronary bypass  graft History of CAD status post CABG November 2008 LIMA to LAD, vein to ramus branch and PDA.  I performed cardiac catheterization on her 06/23/2019 during her hospitalization for unstable angina and stented her distal PDA SVG.  This was complicated by no reflow to requiring additional stents and Aggrastat.  She had another non-STEMI 03/05/2020 underwent cardiac catheterization by Dr. Pernell Dupre revealing an occluded SVG to the PDA proximal to her I had previously stented with left-to-right collaterals.  Medical therapy was recommended at that time and ranolazine was increased.  She gets occasional chest pain which has not changed in frequency or severity.  Essential hypertension History of essential hypertension blood pressure measured today at 120/82.  She is on metoprolol.      Lorretta Harp MD FACP,FACC,FAHA, [facility] 11/05/2020 2:39 PM

## 2020-11-05 NOTE — Assessment & Plan Note (Signed)
History of CAD status post CABG November 2008 LIMA to LAD, vein to ramus branch and PDA.  I performed cardiac catheterization on her 06/23/2019 during her hospitalization for unstable angina and stented her distal PDA SVG.  This was complicated by no reflow to requiring additional stents and Aggrastat.  She had another non-STEMI 03/05/2020 underwent cardiac catheterization by Dr. Pernell Dupre revealing an occluded SVG to the PDA proximal to her I had previously stented with left-to-right collaterals.  Medical therapy was recommended at that time and ranolazine was increased.  She gets occasional chest pain which has not changed in frequency or severity.

## 2020-11-08 ENCOUNTER — Other Ambulatory Visit: Payer: Self-pay | Admitting: Cardiovascular Disease

## 2020-11-26 ENCOUNTER — Encounter (HOSPITAL_COMMUNITY): Payer: Self-pay

## 2020-11-26 ENCOUNTER — Other Ambulatory Visit: Payer: Self-pay

## 2020-11-26 ENCOUNTER — Emergency Department (HOSPITAL_COMMUNITY)
Admission: EM | Admit: 2020-11-26 | Discharge: 2020-11-27 | Disposition: A | Discharge status: Home / Self Care | Payer: Medicare Other | Attending: Emergency Medicine | Admitting: Emergency Medicine

## 2020-11-26 ENCOUNTER — Emergency Department (HOSPITAL_COMMUNITY): Payer: Medicare Other

## 2020-11-26 DIAGNOSIS — R6883 Chills (without fever): Secondary | ICD-10-CM | POA: Insufficient documentation

## 2020-11-26 DIAGNOSIS — Z87891 Personal history of nicotine dependence: Secondary | ICD-10-CM | POA: Diagnosis not present

## 2020-11-26 DIAGNOSIS — Z96651 Presence of right artificial knee joint: Secondary | ICD-10-CM | POA: Diagnosis not present

## 2020-11-26 DIAGNOSIS — Z7982 Long term (current) use of aspirin: Secondary | ICD-10-CM | POA: Insufficient documentation

## 2020-11-26 DIAGNOSIS — J449 Chronic obstructive pulmonary disease, unspecified: Secondary | ICD-10-CM | POA: Diagnosis not present

## 2020-11-26 DIAGNOSIS — I110 Hypertensive heart disease with heart failure: Secondary | ICD-10-CM | POA: Diagnosis not present

## 2020-11-26 DIAGNOSIS — R197 Diarrhea, unspecified: Secondary | ICD-10-CM | POA: Insufficient documentation

## 2020-11-26 DIAGNOSIS — Z955 Presence of coronary angioplasty implant and graft: Secondary | ICD-10-CM | POA: Insufficient documentation

## 2020-11-26 DIAGNOSIS — Z7951 Long term (current) use of inhaled steroids: Secondary | ICD-10-CM | POA: Insufficient documentation

## 2020-11-26 DIAGNOSIS — Z79899 Other long term (current) drug therapy: Secondary | ICD-10-CM | POA: Insufficient documentation

## 2020-11-26 DIAGNOSIS — I509 Heart failure, unspecified: Secondary | ICD-10-CM | POA: Insufficient documentation

## 2020-11-26 DIAGNOSIS — R1013 Epigastric pain: Secondary | ICD-10-CM | POA: Diagnosis present

## 2020-11-26 DIAGNOSIS — R112 Nausea with vomiting, unspecified: Secondary | ICD-10-CM

## 2020-11-26 DIAGNOSIS — Z951 Presence of aortocoronary bypass graft: Secondary | ICD-10-CM | POA: Insufficient documentation

## 2020-11-26 DIAGNOSIS — Z7902 Long term (current) use of antithrombotics/antiplatelets: Secondary | ICD-10-CM | POA: Insufficient documentation

## 2020-11-26 DIAGNOSIS — K219 Gastro-esophageal reflux disease without esophagitis: Secondary | ICD-10-CM | POA: Diagnosis not present

## 2020-11-26 DIAGNOSIS — I2510 Atherosclerotic heart disease of native coronary artery without angina pectoris: Secondary | ICD-10-CM | POA: Diagnosis not present

## 2020-11-26 LAB — URINALYSIS, ROUTINE W REFLEX MICROSCOPIC
Bilirubin Urine: NEGATIVE
Glucose, UA: NEGATIVE mg/dL
Ketones, ur: NEGATIVE mg/dL
Leukocytes,Ua: NEGATIVE
Nitrite: NEGATIVE
Protein, ur: >=300 mg/dL — AB
Specific Gravity, Urine: 1.022 (ref 1.005–1.030)
pH: 7 (ref 5.0–8.0)

## 2020-11-26 LAB — COMPREHENSIVE METABOLIC PANEL
ALT: 20 U/L (ref 0–44)
AST: 25 U/L (ref 15–41)
Albumin: 4.1 g/dL (ref 3.5–5.0)
Alkaline Phosphatase: 156 U/L — ABNORMAL HIGH (ref 38–126)
Anion gap: 13 (ref 5–15)
BUN: 14 mg/dL (ref 6–20)
CO2: 24 mmol/L (ref 22–32)
Calcium: 9.9 mg/dL (ref 8.9–10.3)
Chloride: 102 mmol/L (ref 98–111)
Creatinine, Ser: 1.11 mg/dL — ABNORMAL HIGH (ref 0.44–1.00)
GFR, Estimated: 58 mL/min — ABNORMAL LOW (ref >60)
Glucose, Bld: 211 mg/dL — ABNORMAL HIGH (ref 70–99)
Potassium: 3.5 mmol/L (ref 3.5–5.1)
Sodium: 139 mmol/L (ref 135–145)
Total Bilirubin: 0.7 mg/dL (ref 0.3–1.2)
Total Protein: 7.9 g/dL (ref 6.5–8.1)

## 2020-11-26 LAB — CBC WITH DIFFERENTIAL/PLATELET
Abs Immature Granulocytes: 0.08 10*3/uL — ABNORMAL HIGH (ref 0.00–0.07)
Basophils Absolute: 0.1 10*3/uL (ref 0.0–0.1)
Basophils Relative: 1 %
Eosinophils Absolute: 0 10*3/uL (ref 0.0–0.5)
Eosinophils Relative: 0 %
HCT: 38.8 % (ref 36.0–46.0)
Hemoglobin: 12.3 g/dL (ref 12.0–15.0)
Immature Granulocytes: 1 %
Lymphocytes Relative: 17 %
Lymphs Abs: 2.5 10*3/uL (ref 0.7–4.0)
MCH: 27.4 pg (ref 26.0–34.0)
MCHC: 31.7 g/dL (ref 30.0–36.0)
MCV: 86.4 fL (ref 80.0–100.0)
Monocytes Absolute: 0.5 10*3/uL (ref 0.1–1.0)
Monocytes Relative: 4 %
Neutro Abs: 11.8 10*3/uL — ABNORMAL HIGH (ref 1.7–7.7)
Neutrophils Relative %: 77 %
Platelets: 304 10*3/uL (ref 150–400)
RBC: 4.49 MIL/uL (ref 3.87–5.11)
RDW: 19 % — ABNORMAL HIGH (ref 11.5–15.5)
WBC: 15 10*3/uL — ABNORMAL HIGH (ref 4.0–10.5)
nRBC: 0 % (ref 0.0–0.2)

## 2020-11-26 LAB — LIPASE, BLOOD: Lipase: 39 U/L (ref 11–51)

## 2020-11-26 LAB — TROPONIN I (HIGH SENSITIVITY)
Troponin I (High Sensitivity): 7 ng/L (ref <18)
Troponin I (High Sensitivity): 8 ng/L (ref <18)

## 2020-11-26 MED ORDER — IOHEXOL 300 MG/ML  SOLN
100.0000 mL | Freq: Once | INTRAMUSCULAR | Status: AC | PRN
  Administered 2020-11-26: 100 mL via INTRAVENOUS

## 2020-11-26 MED ORDER — ONDANSETRON HCL 4 MG/2ML IJ SOLN
4.0000 mg | Freq: Once | INTRAMUSCULAR | Status: AC
  Administered 2020-11-26: 4 mg via INTRAVENOUS
  Filled 2020-11-26: qty 2

## 2020-11-26 MED ORDER — HYDROMORPHONE HCL 1 MG/ML IJ SOLN
1.0000 mg | Freq: Once | INTRAMUSCULAR | Status: AC
  Administered 2020-11-26: 1 mg via INTRAVENOUS
  Filled 2020-11-26: qty 1

## 2020-11-26 MED ORDER — SODIUM CHLORIDE 0.9 % IV BOLUS
500.0000 mL | Freq: Once | INTRAVENOUS | Status: AC
  Administered 2020-11-26: 500 mL via INTRAVENOUS

## 2020-11-26 NOTE — ED Provider Notes (Signed)
[facility] EMERGENCY DEPARTMENT Provider Note   CSN: [id number] Arrival date & time:        History Chief Complaint  Patient presents with  . Abdominal Pain    [name] is a 60 y.o. female.  [name] has had some upper abdominal pain for a while.  She states that no one has been able to diagnose the problem.  She reports that she has not had any imaging.  However, her pain is much worse today.  She has also started to experience nausea, vomiting, and diarrhea.  Her sister is in the room, and she states that some of the symptoms remind her of her sister's heart attack.  The history is provided by the patient.  Abdominal Pain Pain location:  Epigastric Pain quality: sharp and stabbing   Pain radiates to:  Does not radiate Pain severity:  Severe Onset quality:  Gradual Timing:  Constant Progression:  Worsening Chronicity:  Recurrent Context: not previous surgeries, not recent illness, not sick contacts, not suspicious food intake and not trauma   Worsened by:  Nothing Ineffective treatments:  None tried Associated symptoms: chills, diarrhea, nausea and vomiting   Associated symptoms: no chest pain, no cough, no dysuria, no fever, no hematuria, no shortness of breath and no sore throat        Past Medical History:  Diagnosis Date  . Anemia    takes iron supplement  . Arthritis    knees, shoulders  . Bipolar 1 disorder ([place])   . Cataract, immature   . CHF (congestive heart failure) ([place])   . COPD (chronic obstructive pulmonary disease) ([place])   . Coronary artery disease    triple vessel  . Family history of adverse reaction to anesthesia    states father "did not do well" with anesthesia; died last time he had surgery; also had heart disease  . Gallstones   . GERD (gastroesophageal reflux disease)    occasional - no current med.  . Glaucoma   . Hammertoe 05/2015   right second toe  . Hepatic steatosis   . High cholesterol   .  History of MI (myocardial infarction)   . Hypertension    states under control with meds., has been on med. x 8 yr.  . Panic attacks   . Peripheral vascular disease ([place])    carotid  . Renal cyst, left   . Schizophrenia ([place])   . Short of breath on exertion   . Sleep apnea    no CPAP use  . Urinary, incontinence, stress female   . Wears dentures    upper  . Wears partial dentures    lower    Patient Active Problem List   Diagnosis Date Noted  . Chronic constipation 04/15/2020  . Bloating 04/15/2020  . Chest pain 03/05/2020  . Coronary artery disease due to lipid rich plaque   . CAD- S/P PCI 07/03/2019  . Hx of CABG 07/03/2019  . Cardiac chest pain   . ACS (acute coronary syndrome) ([place]) 06/20/2019  . NSTEMI (non-ST elevated myocardial infarction) ([place])   . Unstable angina ([place]) 02/15/2016  . Essential hypertension 03/17/2015  . Hyperlipidemia LDL goal <70 12/24/2013  . Coronary artery disease involving coronary bypass graft 12/24/2013  . Expected blood loss anemia 10/22/2012  . Obesity 10/22/2012  . S/P right TKA 10/21/2012    Past Surgical History:  Procedure Laterality Date  . C-SECTIONS X 2  1985, 1988  . CARDIAC CATHETERIZATION  12/10/2006; 12/18/2006; 07/10/2007; 07/19/2009  . CARDIAC CATHETERIZATION  03/04/2010   patent grafts, normal LV function, diffusely diseased RCA (Dr. Adora Fridge)  . CARDIAC CATHETERIZATION N/A 02/15/2016   Procedure: Left Heart Cath and Cors/Grafts Angiography;  Surgeon: Peter M Martinique, MD;  Location: [facility];  Service: Cardiovascular;  Laterality: N/A;  . CLUB FOOT RELEASE Bilateral    1st and 2nd toes  . CORONARY ARTERY BYPASS GRAFT  07/16/2007   LIMA to LAD, VG to ramus, VG to PDA   . CORONARY STENT INTERVENTION N/A 06/23/2019   Procedure: CORONARY STENT INTERVENTION;  Surgeon: Lorretta Harp, MD;  Location: [facility];  Service: Cardiovascular;  Laterality: N/A;  . FLEXIBLE BRONCHOSCOPY  07/16/2007  . HAMMERTOE  RECONSTRUCTION WITH WEIL OSTEOTOMY Right 06/03/2015   Procedure: RIGHT SECOND HAMMERTOE CORRECTION WITH MT WEIL OSTEOTOMY;  Surgeon: Wylene Simmer, MD;  Location: [facility];  Service: Orthopedics;  Laterality: Right;  . HAND SURGERY Bilateral    1st and 2nd fingers - release of clubbing  . KNEE ARTHROSCOPY WITH ANTERIOR CRUCIATE LIGAMENT (ACL) REPAIR Right 01/30/2008  . LEFT HEART CATH AND CORS/GRAFTS ANGIOGRAPHY N/A 06/23/2019   Procedure: LEFT HEART CATH AND CORS/GRAFTS ANGIOGRAPHY;  Surgeon: Lorretta Harp, MD;  Location: [facility];  Service: Cardiovascular;  Laterality: N/A;  . LEFT HEART CATH AND CORS/GRAFTS ANGIOGRAPHY N/A 03/01/2020   Procedure: LEFT HEART CATH AND CORS/GRAFTS ANGIOGRAPHY;  Surgeon: Belva Crome, MD;  Location: [facility];  Service: Cardiovascular;  Laterality: N/A;  . NOVASURE ABLATION  12/19/2007   with removal of IUD  . ORIF FOOT FRACTURE Left   . TOE SURGERIES TO REMOVE NAILS DUE TO CLUBBING    . TOTAL KNEE ARTHROPLASTY Right 10/21/2012   Procedure: TOTAL KNEE ARTHROPLASTY;  Surgeon: Mauri Pole, MD;  Location: [facility];  Service: Orthopedics;  Laterality: Right;     OB History   No obstetric history on file.     Family History  Problem Relation Age of Onset  . Heart disease Father   . Anesthesia problems Father        states "did not do well" with anesthesia  . Lung cancer Mother   . Colon cancer Neg Hx   . Stomach cancer Neg Hx   . Esophageal cancer Neg Hx   . Pancreatic cancer Neg Hx     Social History   Tobacco Use  . Smoking status: Former Smoker    Packs/day: 0.00    Years: 0.00    Pack years: 0.00    Quit date: 05/21/2015    Years since quitting: 5.5  . Smokeless tobacco: Never Used  . Tobacco comment: pt states she quit a week ago 06/14/19  Vaping Use  . Vaping Use: Never used  Substance Use Topics  . Alcohol use: No  . Drug use: No    Home Medications Prior to Admission medications   Medication  Sig Start Date End Date Taking? Authorizing Provider  albuterol (PROVENTIL HFA;VENTOLIN HFA) 108 (90 BASE) MCG/ACT inhaler Inhale 2 puffs into the lungs every 6 (six) hours as needed for wheezing or shortness of breath.     [provider]  amLODipine (NORVASC) 2.5 MG tablet Take 1 tablet (2.5 mg total) by mouth daily. 11/05/20   Lorretta Harp, MD  aspirin 81 MG chewable tablet Chew 81 mg by mouth daily.    [provider]  atorvastatin (LIPITOR) 80 MG tablet Take 1 tablet (  80 mg total) by mouth daily. 08/05/20   Lorretta Harp, MD  BREO ELLIPTA 100-25 MCG/INH AEPB Inhale 1 puff into the lungs daily.  07/29/19   [provider]  brimonidine (ALPHAGAN) 0.15 % ophthalmic solution Place 1 drop into both eyes 2 (two) times daily.     [provider]  cholecalciferol (VITAMIN D3) 25 MCG (1000 UNIT) tablet Take 1,000 Units by mouth daily.    [provider]  clopidogrel (PLAVIX) 75 MG tablet Take 1 tablet (75 mg total) by mouth daily with breakfast. 06/26/19   Furth, Cadence H, PA-C  escitalopram (LEXAPRO) 10 MG tablet Take 10 mg by mouth daily. 05/31/19   [provider]  fluticasone (FLONASE) 50 MCG/ACT nasal spray Place 1 spray into both nostrils daily. 06/13/19   [provider]  isosorbide mononitrate (IMDUR) 30 MG 24 hr tablet Take 3 tablets (90 mg total) by mouth daily. 03/07/20 04/06/20  Little Ishikawa, MD  lamoTRIgine (LAMICTAL) 150 MG tablet Take 150 mg by mouth daily.  08/05/19   [provider]  linaclotide Rolan Lipa) 72 MCG capsule Take by mouth.    [provider]  metoprolol tartrate (LOPRESSOR) 25 MG tablet Take 0.5 tablets (12.5 mg total) by mouth 2 (two) times daily. 11/05/20   Lorretta Harp, MD  niacin (NIASPAN) 1000 MG CR tablet TAKE 1 TABLET (1,000 MG TOTAL) BY MOUTH AT BEDTIME. 03/24/20   Lorretta Harp, MD  nitroGLYCERIN (NITROSTAT) 0.4 MG SL tablet Place 1 tablet (0.4 mg total) under the  tongue every 5 (five) minutes as needed for chest pain. 07/03/19   Erlene Quan, PA-C  ondansetron (ZOFRAN-ODT) 8 MG disintegrating tablet Take 8 mg by mouth every 8 (eight) hours as needed for nausea or vomiting.  02/26/20   [provider]  PRALUENT 150 MG/ML SOAJ INJECT 1 PEN INTO THE SKIN EVERY 14 (FOURTEEN) DAYS. 11/08/20   Lorretta Harp, MD  QUEtiapine (SEROQUEL) 200 MG tablet Take 125 mg by mouth at bedtime. Takes 1 1/4 tablet (125 mg) 05/31/19   [provider]  VASCEPA 1 g capsule TAKE 2 CAPSULES (2 G TOTAL) BY MOUTH 2 (TWO) TIMES DAILY. 06/21/20   Lorretta Harp, MD  vitamin B-12 (CYANOCOBALAMIN) 1000 MCG tablet Take 1,000 mcg by mouth daily.    [provider]    Allergies    Benadryl [diphenhydramine hcl], Codeine, Haldol [haloperidol decanoate], Amoxicillin, and Bee venom  Review of Systems   Review of Systems  Constitutional: Positive for chills. Negative for fever.  HENT: Negative for ear pain and sore throat.   Eyes: Negative for pain and visual disturbance.  Respiratory: Negative for cough and shortness of breath.   Cardiovascular: Negative for chest pain and palpitations.  Gastrointestinal: Positive for abdominal pain, diarrhea, nausea and vomiting.  Genitourinary: Negative for dysuria and hematuria.  Musculoskeletal: Negative for arthralgias and back pain.  Skin: Negative for color change and rash.  Neurological: Negative for seizures and syncope.  All other systems reviewed and are negative.   Physical Exam Updated Vital Signs BP (!) 158/83   Pulse 65   Temp 97.6 F (36.4 C) (Oral)   Resp 17   Ht 5\' 5"  (1.651 m)   Wt 90.7 kg   LMP 11/16/2011   SpO2 95%   BMI 33.28 kg/m   Physical Exam Vitals and nursing note reviewed.  Constitutional:      General: She is in acute distress.     Appearance: She is  well-developed. She is diaphoretic.     Comments: Pale, retching, sweaty  HENT:     Head: Normocephalic and atraumatic.   Eyes:     Conjunctiva/sclera: Conjunctivae normal.  Cardiovascular:     Rate and Rhythm: Normal rate and regular rhythm.     Heart sounds: No murmur heard.   Pulmonary:     Effort: Pulmonary effort is normal. No respiratory distress.     Breath sounds: Normal breath sounds.  Abdominal:     General: Bowel sounds are normal. There is no distension.     Palpations: Abdomen is soft.     Tenderness: There is generalized abdominal tenderness.  Musculoskeletal:     Cervical back: Neck supple.  Skin:    General: Skin is warm.     Coloration: Skin is pale.  Neurological:     General: No focal deficit present.     Mental Status: She is alert.  Psychiatric:        Mood and Affect: Mood normal.     ED Results / Procedures / Treatments   Labs (all labs ordered are listed, but only abnormal results are displayed) Labs Reviewed  CBC WITH DIFFERENTIAL/PLATELET - Abnormal; Notable for the following components:      Result Value   WBC 15.0 (*)    RDW 19.0 (*)    Neutro Abs 11.8 (*)    Abs Immature Granulocytes 0.08 (*)    All other components within normal limits  COMPREHENSIVE METABOLIC PANEL - Abnormal; Notable for the following components:   Glucose, Bld 211 (*)    Creatinine, Ser 1.11 (*)    Alkaline Phosphatase 156 (*)    GFR, Estimated 58 (*)    All other components within normal limits  URINALYSIS, ROUTINE W REFLEX MICROSCOPIC - Abnormal; Notable for the following components:   APPearance CLOUDY (*)    Hgb urine dipstick SMALL (*)    Protein, ur >=300 (*)    Bacteria, UA FEW (*)    All other components within normal limits  LIPASE, BLOOD  TROPONIN I (HIGH SENSITIVITY)  TROPONIN I (HIGH SENSITIVITY)    EKG EKG Interpretation  Date/Time:  Friday November 26 2020 19:27:56 EDT Ventricular Rate:  57 PR Interval:  200 QRS Duration: 132 QT Interval:  519 QTC Calculation: 506 R Axis:   24 Text Interpretation: Sinus rhythm Left ventricular hypertrophy Inferior infarct, age  indeterminate T wave inversion inferiorly and anteriorly similar to prior Confirmed by Lorre Munroe ([id number]) on 11/26/2020 7:34:38 PM   Radiology CT Abdomen Pelvis W Contrast  Result Date: 11/26/2020 CLINICAL DATA:  Abdominal pain with nausea vomiting and diarrhea EXAM: CT ABDOMEN AND PELVIS WITH CONTRAST TECHNIQUE: Multidetector CT imaging of the abdomen and pelvis was performed using the standard protocol following bolus administration of intravenous contrast. CONTRAST:  143mL OMNIPAQUE IOHEXOL 300 MG/ML  SOLN COMPARISON:  CT 06/24/2014 FINDINGS: Lower chest: Lung bases demonstrate dependent atelectasis. No acute consolidation or effusion. Mild cardiomegaly. Coronary calcifications. Hepatobiliary: No focal hepatic abnormality. Small gallstones. No biliary dilatation Pancreas: Unremarkable. No pancreatic ductal dilatation or surrounding inflammatory changes. Spleen: Normal in size without focal abnormality. Adrenals/Urinary Tract: Adrenal glands are normal. Cysts within the bilateral kidneys. Numerous subcentimeter cortical lesions within the bilateral kidneys too small to further characterize. 4 mm intermediate density lesion anterior cortex left kidney series 3, image number 30, also too small to further characterize. The bladder is unremarkable Stomach/Bowel: Stomach is within normal limits. Appendix appears normal. No evidence of bowel wall thickening, distention,  or inflammatory changes. Vascular/Lymphatic: Mild aortic atherosclerosis. No aneurysm. No suspicious nodes Reproductive: Uterus and bilateral adnexa are unremarkable. Other: Negative for free air or free fluid. Small fat containing umbilical hernia. Musculoskeletal: No acute or significant osseous findings. IMPRESSION: 1. No CT evidence for acute intra-abdominal or pelvic abnormality. 2. Gallstones. Aortic Atherosclerosis (ICD10-I70.0). Electronically Signed   By: Donavan Foil M.D.   On: 11/26/2020 21:32    Procedures Procedures   Medications  Ordered in ED Medications  HYDROmorphone (DILAUDID) injection 1 mg (has no administration in time range)  sodium chloride 0.9 % bolus 500 mL (has no administration in time range)  ondansetron (ZOFRAN) injection 4 mg (has no administration in time range)    ED Course  I have reviewed the triage vital signs and the nursing notes.  Pertinent labs & imaging results that were available during my care of the patient were reviewed by me and considered in my medical decision making (see chart for details).    MDM Rules/Calculators/A&P                          [name] is complaining of severe abdominal pain.  She was evaluated for not only intra-abdominal sources of pathology but also for ACS given her strong history of coronary artery disease.  ED work-up was essentially normal.  No indication of ACS.  Abdominal CT scan was also normal.  I do think patient needs to follow-up with her GI doctor.  She is established with a GI doctor.  It sounds like the majority of the symptoms have been somewhat chronic in nature.  She will be discharged home.  Final Clinical Impression(s) / ED Diagnoses Final diagnoses:  Epigastric pain  Nausea vomiting and diarrhea    Rx / DC Orders ED Discharge Orders    None       Arnaldo Natal, MD 11/26/20 2354

## 2020-11-26 NOTE — ED Triage Notes (Signed)
Patient to ED via EMS for complaints of abd pain and N/V/D since yesterday. States abd pain is located in epigastric area, 10/10. Has hx of multiple MI and CHF.

## 2020-11-27 NOTE — ED Notes (Signed)
Patient and family member given discharge paperwork and instructions. Verbalized understanding of teaching. IV d/c with cath tip intact. Ambulatory to exit in NAD with steady gait.

## 2020-12-02 DIAGNOSIS — E559 Vitamin D deficiency, unspecified: Secondary | ICD-10-CM | POA: Insufficient documentation

## 2020-12-02 DIAGNOSIS — R7303 Prediabetes: Secondary | ICD-10-CM | POA: Insufficient documentation

## 2020-12-06 DIAGNOSIS — H9313 Tinnitus, bilateral: Secondary | ICD-10-CM | POA: Insufficient documentation

## 2020-12-06 DIAGNOSIS — H903 Sensorineural hearing loss, bilateral: Secondary | ICD-10-CM | POA: Insufficient documentation

## 2020-12-10 ENCOUNTER — Encounter (HOSPITAL_COMMUNITY): Payer: Self-pay

## 2020-12-10 ENCOUNTER — Telehealth (HOSPITAL_COMMUNITY): Payer: Self-pay

## 2020-12-10 NOTE — Telephone Encounter (Signed)
Pt insurance is active and benefits verified through Medicare A/B Co-pay 0, DED 0/0 met, out of pocket 0/0 met, co-insurance 0%. no pre-authorization required. Passport, 12/10/2020@3 :53pm, REF# [id number]  2ndary insurance is active and benefits verified through Medicaid. Co-pay 0, DED 0/0 met, out of pocket 0/0 met, co-insurance 0%. No pre-authorization required. Passport, 12/10/2020@4 :09pm, REF# [id number]   Will contact patient to see if she is interested in the Cardiac Rehab Program. If interested, patient will need to complete follow up appt. Once completed, patient will be contacted for scheduling upon review by the RN Navigator.

## 2020-12-13 ENCOUNTER — Other Ambulatory Visit: Payer: Self-pay | Admitting: Family Medicine

## 2020-12-13 DIAGNOSIS — Z1231 Encounter for screening mammogram for malignant neoplasm of breast: Secondary | ICD-10-CM

## 2020-12-16 ENCOUNTER — Encounter: Payer: Self-pay | Admitting: Gastroenterology

## 2020-12-16 ENCOUNTER — Telehealth: Payer: Self-pay

## 2020-12-16 ENCOUNTER — Ambulatory Visit (INDEPENDENT_AMBULATORY_CARE_PROVIDER_SITE_OTHER): Payer: Medicare Other | Admitting: Gastroenterology

## 2020-12-16 VITALS — BP 134/82 | HR 74 | Ht 65.0 in | Wt 210.4 lb

## 2020-12-16 DIAGNOSIS — R109 Unspecified abdominal pain: Secondary | ICD-10-CM

## 2020-12-16 DIAGNOSIS — Z1211 Encounter for screening for malignant neoplasm of colon: Secondary | ICD-10-CM

## 2020-12-16 DIAGNOSIS — Z1212 Encounter for screening for malignant neoplasm of rectum: Secondary | ICD-10-CM

## 2020-12-16 DIAGNOSIS — R140 Abdominal distension (gaseous): Secondary | ICD-10-CM

## 2020-12-16 DIAGNOSIS — I25810 Atherosclerosis of coronary artery bypass graft(s) without angina pectoris: Secondary | ICD-10-CM

## 2020-12-16 DIAGNOSIS — R112 Nausea with vomiting, unspecified: Secondary | ICD-10-CM | POA: Diagnosis not present

## 2020-12-16 DIAGNOSIS — K5909 Other constipation: Secondary | ICD-10-CM

## 2020-12-16 MED ORDER — NA SULFATE-K SULFATE-MG SULF 17.5-3.13-1.6 GM/177ML PO SOLN: 1.0000 | Freq: Once | ORAL | 0 refills | Status: AC

## 2020-12-16 NOTE — Telephone Encounter (Signed)
[facility] Pre-operative Risk Assessment     Request for surgical clearance:     Endoscopy Procedure  What type of surgery is being performed?    Colonoscopy   When is this surgery scheduled?     02/03/21  What type of clearance is required ?   Pharmacy  Are there any medications that need to be held prior to surgery and how long? Plavix 5 days.  Practice name and name of physician performing surgery?      [facility]  What is your office phone and fax number?      Phone- [phone]  [phone]  Anesthesia type (None, local, MAC, general) ?       MAC

## 2020-12-16 NOTE — Telephone Encounter (Signed)
[name] female is requesting preoperative cardiac evaluation for colonoscopy.  She was last seen in the clinic on 11/06/2019.  She was doing well at that time.  She reported she had stopped smoking.  She also reported gastrointestinal bloating and constipation.  She denied changes in her chest pain frequency and severity.  Her last cardiac catheterization was in the setting of NSTEMI 03/05/2020.  That showed an occluded RCA vein graft.  The occlusion was proximal to the stenting that had been performed by you in November the previous year.  She was noted to have left-to-right collaterals.  Her echocardiogram showed normal LVEF.  Medical management was recommended.  Her PMH includes coronary artery disease status post CABG, hyperlipidemia, essential hypertension, obesity, right TKA, and chronic constipation.   May her Plavix be held prior to the procedure?   Thank you for your help.  Please direct response to CV DIV preop pool.  [name]. Angela Iyengar NP-C    12/16/2020, 4:50 PM [address] Office [phone] Fax [phone]

## 2020-12-16 NOTE — Patient Instructions (Signed)
If you are age 59 or older, your body mass index should be between 23-30. Your Body mass index is 35.01 kg/m. If this is out of the aforementioned range listed, please consider follow up with your Primary Care Provider.  If you are age 88 or younger, your body mass index should be between 19-25. Your Body mass index is 35.01 kg/m. If this is out of the aformentioned range listed, please consider follow up with your Primary Care Provider.   You have been scheduled for an endoscopy and colonoscopy. Please follow the written instructions given to you at your visit today. Please pick up your prep supplies at the pharmacy within the next 1-3 days. If you use inhalers (even only as needed), please bring them with you on the day of your procedure.  Thank you for choosing me and [facility].  Alonza Bogus , PA-C

## 2020-12-16 NOTE — Progress Notes (Signed)
12/16/2020 HAYDEN MABIN [id number] 08/11/1961   HISTORY OF PRESENT ILLNESS: This is a 60 year old female who is a patient of Dr. Vena Rua.  She is here today as an ER follow-up of abdominal pain.  Previously EGD and colonoscopies have been recommended for evaluation of her symptoms and for colorectal cancer screening, but she had recent cardiac intervention with stents and was on Plavix, etc.  She is now off of her Plavix after recent ER visit as there has been anticipation that she will undergo endoscopic procedures.  She anticipates that they will restart her Plavix once the procedures are performed.  Nonetheless, she is here today with complaints of epigastric abdominal pain and bloating in her epigastric region.  She states this has been ongoing since before she was seen by me last August.  She says that she can only eat 1 small meal a day because she feels so bloated and full.  She said that her belly hurts every single day and is affecting her sleep and her mood, etc.  She is nauseous all the time.  Her abdominal pain is in her upper abdomen, she says sometimes more to the right side.  She is not on any acid reflux type medication as she did not believe that her symptoms were acid reflux related in the past.  She is using Zofran as needed.  She is on Linzess 145 mcg daily that I prescribed to her at her last visit for her constipation.  She says that that works well.  She is never had colonoscopy in the past.  She had a CT scan of the abdomen and pelvis with contrast performed on April 8 that showed no acute abnormalities.  It did show gallstones.  Lipase was normal.  CMP showed normal LFTs except for an elevated alk phos at 156.  CBC showed a white blood cell count of 15,000, but this has been elevated on multiple occasions in the past.  She tells me that she is not currently on her Plavix as she was recently taken off of it as they knew there were plans for endoscopic evaluation.  She  anticipates they will be putting her back on this once her endoscopic procedures are performed.  Past Medical History:  Diagnosis Date  . Anemia    takes iron supplement  . Arthritis    knees, shoulders  . Bipolar 1 disorder ([place])   . Cataract, immature   . CHF (congestive heart failure) ([place])   . COPD (chronic obstructive pulmonary disease) ([place])   . Coronary artery disease    triple vessel  . Family history of adverse reaction to anesthesia    states father "did not do well" with anesthesia; died last time he had surgery; also had heart disease  . Gallstones   . GERD (gastroesophageal reflux disease)    occasional - no current med.  . Glaucoma   . Hammertoe 05/2015   right second toe  . Hepatic steatosis   . High cholesterol   . History of MI (myocardial infarction)   . Hypertension    states under control with meds., has been on med. x 8 yr.  . Panic attacks   . Peripheral vascular disease ([place])    carotid  . Renal cyst, left   . Schizophrenia ([place])   . Short of breath on exertion   . Sleep apnea    no CPAP use  . Urinary, incontinence, stress female   . Wears dentures  upper  . Wears partial dentures    lower   Past Surgical History:  Procedure Laterality Date  . C-SECTIONS X 2  1985, 1988  . CARDIAC CATHETERIZATION  12/10/2006; 12/18/2006; 07/10/2007; 07/19/2009  . CARDIAC CATHETERIZATION  03/04/2010   patent grafts, normal LV function, diffusely diseased RCA (Dr. Adora Fridge)  . CARDIAC CATHETERIZATION N/A 02/15/2016   Procedure: Left Heart Cath and Cors/Grafts Angiography;  Surgeon: Peter M Martinique, MD;  Location: [facility];  Service: Cardiovascular;  Laterality: N/A;  . CLUB FOOT RELEASE Bilateral    1st and 2nd toes  . CORONARY ARTERY BYPASS GRAFT  07/16/2007   LIMA to LAD, VG to ramus, VG to PDA   . CORONARY STENT INTERVENTION N/A 06/23/2019   Procedure: CORONARY STENT INTERVENTION;  Surgeon: Lorretta Harp, MD;  Location: [facility];   Service: Cardiovascular;  Laterality: N/A;  . FLEXIBLE BRONCHOSCOPY  07/16/2007  . HAMMERTOE RECONSTRUCTION WITH WEIL OSTEOTOMY Right 06/03/2015   Procedure: RIGHT SECOND HAMMERTOE CORRECTION WITH MT WEIL OSTEOTOMY;  Surgeon: Wylene Simmer, MD;  Location: [facility];  Service: Orthopedics;  Laterality: Right;  . HAND SURGERY Bilateral    1st and 2nd fingers - release of clubbing  . KNEE ARTHROSCOPY WITH ANTERIOR CRUCIATE LIGAMENT (ACL) REPAIR Right 01/30/2008  . LEFT HEART CATH AND CORS/GRAFTS ANGIOGRAPHY N/A 06/23/2019   Procedure: LEFT HEART CATH AND CORS/GRAFTS ANGIOGRAPHY;  Surgeon: Lorretta Harp, MD;  Location: [facility];  Service: Cardiovascular;  Laterality: N/A;  . LEFT HEART CATH AND CORS/GRAFTS ANGIOGRAPHY N/A 03/01/2020   Procedure: LEFT HEART CATH AND CORS/GRAFTS ANGIOGRAPHY;  Surgeon: Belva Crome, MD;  Location: [facility];  Service: Cardiovascular;  Laterality: N/A;  . NOVASURE ABLATION  12/19/2007   with removal of IUD  . ORIF FOOT FRACTURE Left   . TOE SURGERIES TO REMOVE NAILS DUE TO CLUBBING    . TOTAL KNEE ARTHROPLASTY Right 10/21/2012   Procedure: TOTAL KNEE ARTHROPLASTY;  Surgeon: Mauri Pole, MD;  Location: [facility];  Service: Orthopedics;  Laterality: Right;    reports that she quit smoking about 5 years ago. She smoked 0.00 packs per day for 0.00 years. She has never used smokeless tobacco. She reports that she does not drink alcohol and does not use drugs. family history includes Anesthesia problems in her father; Heart disease in her father; Lung cancer in her mother. Allergies  Allergen Reactions  . Benadryl [Diphenhydramine Hcl] Shortness Of Breath and Swelling    Tongue swelling  . Codeine Other (See Comments)    "makes me psychotic"   . Haldol [Haloperidol Decanoate] Anaphylaxis  . Amoxicillin Hives and Swelling    Swelling to extremities Did it involve swelling of the face/tongue/throat, SOB, or low BP? Yes Did it involve  sudden or severe rash/hives, skin peeling, or any reaction on the inside of your mouth or nose? Yes Did you need to seek medical attention at a hospital or doctor's office? Yes When did it last happen?young adult If all above answers are "NO", may proceed with cephalosporin use.  . Bee Venom Swelling and Other (See Comments)    "pimples" from head to toe and whole body swelling      Outpatient Encounter Medications as of 12/16/2020  Medication Sig  . albuterol (PROVENTIL HFA;VENTOLIN HFA) 108 (90 BASE) MCG/ACT inhaler Inhale 2 puffs into the lungs every 6 (six) hours as needed for wheezing or shortness of breath.   Marland Kitchen amLODipine (NORVASC)  2.5 MG tablet Take 1 tablet (2.5 mg total) by mouth daily.  Marland Kitchen aspirin 81 MG chewable tablet Chew 81 mg by mouth daily.  Marland Kitchen atorvastatin (LIPITOR) 80 MG tablet Take 1 tablet (80 mg total) by mouth daily.  Marland Kitchen BREO ELLIPTA 100-25 MCG/INH AEPB Inhale 1 puff into the lungs daily.   . brimonidine (ALPHAGAN) 0.15 % ophthalmic solution Place 1 drop into both eyes 2 (two) times daily.   . cholecalciferol (VITAMIN D3) 25 MCG (1000 UNIT) tablet Take 1,000 Units by mouth daily.  Marland Kitchen escitalopram (LEXAPRO) 10 MG tablet Take 10 mg by mouth daily.  . fluticasone (FLONASE) 50 MCG/ACT nasal spray Place 1 spray into both nostrils daily.  Marland Kitchen lamoTRIgine (LAMICTAL) 150 MG tablet Take 150 mg by mouth daily.   Marland Kitchen linaclotide (LINZESS) 72 MCG capsule Take by mouth.  . metoprolol tartrate (LOPRESSOR) 25 MG tablet Take 0.5 tablets (12.5 mg total) by mouth 2 (two) times daily.  . niacin (NIASPAN) 1000 MG CR tablet TAKE 1 TABLET (1,000 MG TOTAL) BY MOUTH AT BEDTIME.  . nitroGLYCERIN (NITROSTAT) 0.4 MG SL tablet Place 1 tablet (0.4 mg total) under the tongue every 5 (five) minutes as needed for chest pain.  Marland Kitchen ondansetron (ZOFRAN-ODT) 8 MG disintegrating tablet Take 8 mg by mouth every 8 (eight) hours as needed for nausea or vomiting.   Marland Kitchen PRALUENT 150 MG/ML SOAJ INJECT 1 PEN INTO THE  SKIN EVERY 14 (FOURTEEN) DAYS.  Marland Kitchen QUEtiapine (SEROQUEL) 200 MG tablet Take 125 mg by mouth at bedtime. Takes 1 1/4 tablet (125 mg)  . VASCEPA 1 g capsule TAKE 2 CAPSULES (2 G TOTAL) BY MOUTH 2 (TWO) TIMES DAILY.  . vitamin B-12 (CYANOCOBALAMIN) 1000 MCG tablet Take 1,000 mcg by mouth daily.  . clopidogrel (PLAVIX) 75 MG tablet Take 1 tablet (75 mg total) by mouth daily with breakfast. (Patient not taking: Reported on 12/16/2020)  . isosorbide mononitrate (IMDUR) 30 MG 24 hr tablet Take 3 tablets (90 mg total) by mouth daily.   No facility-administered encounter medications on file as of 12/16/2020.     REVIEW OF SYSTEMS  : All other systems reviewed and negative except where noted in the History of Present Illness.   PHYSICAL EXAM: BP 134/82 (BP Location: Left Arm, Patient Position: Sitting, Cuff Size: Normal)   Pulse 74   Ht 5' 5"  (1.651 m)   Wt 210 lb 6 oz (95.4 kg)   LMP 11/16/2011   BMI 35.01 kg/m  General: Well developed white female in no acute distress Head: Normocephalic and atraumatic Eyes:  Sclerae anicteric, conjunctiva pink. Ears: Normal auditory acuity Lungs: Clear throughout to auscultation; no W/R/R. Heart: Regular rate and rhythm; no M/R/G. Abdomen: Soft, non-distended.  BS present.  Epigastric TTP and RUQ. Rectal:  Will be done at the time of colonoscopy. Musculoskeletal: Symmetrical with no gross deformities  Skin: No lesions on visible extremities Extremities: No edema  Neurological: Alert oriented x 4, grossly non-focal Psychological:  Alert and cooperative. Normal mood and affect  ASSESSMENT AND PLAN: *Epigastric abdominal pain, upper abdominal bloating, nausea and vomiting, atypical chest pain.  She previously had not thought that this was acid related so is not on any antireflux medications currently.  She is using Zofran as needed.  We will plan for EGD with Dr. Havery Moros.  ?  If this could be related to gallstones pending results of her endoscopic  evaluation. *Chronic constipation: Does well on Linzess 145 mcg daily.  We will continue *CRC screening:  Never  had colonoscopy in the past.  Will plan for that with Dr. Havery Moros as well.  Had not been able to do this in the past at previous visits due to antiplatelet use in recent cardiac interventions.  She is not currently on her Plavix, but she anticipates that they will restart this once her procedures are performed.  **The risks, benefits, and alternatives to EGD and colonoscopy were discussed with the patient and she consents to proceed.   CC:  Katherina Mires, MD

## 2020-12-17 ENCOUNTER — Encounter: Payer: Self-pay | Admitting: Gastroenterology

## 2020-12-17 DIAGNOSIS — R109 Unspecified abdominal pain: Secondary | ICD-10-CM | POA: Insufficient documentation

## 2020-12-17 DIAGNOSIS — Z1211 Encounter for screening for malignant neoplasm of colon: Secondary | ICD-10-CM | POA: Insufficient documentation

## 2020-12-17 DIAGNOSIS — R112 Nausea with vomiting, unspecified: Secondary | ICD-10-CM | POA: Insufficient documentation

## 2020-12-17 NOTE — Telephone Encounter (Signed)
Cleared for colonoscopy. Okay to hold antiplatelet agents 

## 2020-12-17 NOTE — Telephone Encounter (Signed)
Left VM for patient to call me back regarding holding plavix.

## 2020-12-17 NOTE — Telephone Encounter (Signed)
   Primary Cardiologist: Quay Burow, MD  Chart reviewed as part of pre-operative protocol coverage. Given past medical history and time since last visit, based on ACC/AHA guidelines, [name] would be at acceptable risk for the planned procedure without further cardiovascular testing.   Her Plavix may be held for 5 days prior to her procedure.  Please resume as soon as hemostasis is achieved.  I will route this recommendation to the requesting party via Epic fax function and remove from pre-op pool.  Please call with questions.  Jossie Ng. Sherrell Weir NP-C    12/17/2020, 2:09 PM [address] Office [phone] Fax [phone]

## 2020-12-17 NOTE — Progress Notes (Signed)
Jess this patient belongs to Dr. Hilarie Fredrickson if you want to have these procedures scheduled with him and forward note. Thanks

## 2020-12-29 NOTE — Progress Notes (Signed)
Addendum: Reviewed and agree with assessment and management plan. Torah Pinnock M, MD  

## 2021-01-04 NOTE — Telephone Encounter (Signed)
Called patient to inform her that it was ok for her to hold Plavix 5 days Prior to her procedure. Patient stated she was not taking it right now that she spoke with Cardiology and she wanted to see how she does without taking Plavix.

## 2021-01-06 ENCOUNTER — Ambulatory Visit: Payer: Medicare Other

## 2021-02-03 ENCOUNTER — Encounter: Payer: Self-pay | Admitting: Gastroenterology

## 2021-02-03 ENCOUNTER — Other Ambulatory Visit: Payer: Self-pay

## 2021-02-03 ENCOUNTER — Ambulatory Visit (AMBULATORY_SURGERY_CENTER): Payer: Medicare Other | Admitting: Gastroenterology

## 2021-02-03 VITALS — BP 129/74 | HR 64 | Temp 97.1°F | Resp 15 | Ht 65.0 in | Wt 210.0 lb

## 2021-02-03 DIAGNOSIS — K2970 Gastritis, unspecified, without bleeding: Secondary | ICD-10-CM | POA: Diagnosis not present

## 2021-02-03 DIAGNOSIS — R112 Nausea with vomiting, unspecified: Secondary | ICD-10-CM

## 2021-02-03 DIAGNOSIS — Z538 Procedure and treatment not carried out for other reasons: Secondary | ICD-10-CM

## 2021-02-03 DIAGNOSIS — K5900 Constipation, unspecified: Secondary | ICD-10-CM

## 2021-02-03 DIAGNOSIS — Z1211 Encounter for screening for malignant neoplasm of colon: Secondary | ICD-10-CM

## 2021-02-03 DIAGNOSIS — K648 Other hemorrhoids: Secondary | ICD-10-CM | POA: Diagnosis not present

## 2021-02-03 DIAGNOSIS — B9681 Helicobacter pylori [H. pylori] as the cause of diseases classified elsewhere: Secondary | ICD-10-CM

## 2021-02-03 DIAGNOSIS — K2950 Unspecified chronic gastritis without bleeding: Secondary | ICD-10-CM

## 2021-02-03 DIAGNOSIS — K449 Diaphragmatic hernia without obstruction or gangrene: Secondary | ICD-10-CM

## 2021-02-03 DIAGNOSIS — R109 Unspecified abdominal pain: Secondary | ICD-10-CM

## 2021-02-03 MED ORDER — SODIUM CHLORIDE 0.9 % IV SOLN: 500.0000 mL | Freq: Once | INTRAVENOUS | Status: DC

## 2021-02-03 MED ORDER — OMEPRAZOLE 40 MG PO CPDR: 40.0000 mg | DELAYED_RELEASE_CAPSULE | Freq: Every day | ORAL | 0 refills | Status: DC

## 2021-02-03 NOTE — Progress Notes (Signed)
Medical history reviewed. VS assessed by J.K, RN

## 2021-02-03 NOTE — Progress Notes (Signed)
PT taken to PACU. Monitors in place. VSS. Report given to RN. 

## 2021-02-03 NOTE — Op Note (Signed)
[facility] Patient Name: [name] Procedure Date: 02/03/2021 1:19 PM MRN: [id number] Endoscopist: Viviann Spare P. Adela Lank , MD Age: 60 Referring MD:  Date of Birth: [birth year] Gender: Female Account #: [id number] Procedure:                Colonoscopy Indications:              Screening for colorectal malignant neoplasm,                            patient with history of chronic constipation -                            treated with Linzess with significant improvement Medicines:                Monitored Anesthesia Care Procedure:                Pre-Anesthesia Assessment:                           - Prior to the procedure, a History and Physical                            was performed, and patient medications and                            allergies were reviewed. The patient's tolerance of                            previous anesthesia was also reviewed. The risks                            and benefits of the procedure and the sedation                            options and risks were discussed with the patient.                            All questions were answered, and informed consent                            was obtained. Prior Anticoagulants: The patient has                            taken no previous anticoagulant or antiplatelet                            agents. ASA Grade Assessment: III - A patient with                            severe systemic disease. After reviewing the risks                            and benefits, the patient was deemed in  satisfactory condition to undergo the procedure.                           After obtaining informed consent, the colonoscope                            was passed under direct vision. Throughout the                            procedure, the patient's blood pressure, pulse, and                            oxygen saturations were monitored continuously. The                            Olympus  [id number]-H190DL ([id number]) Colonoscope was                            introduced through the anus and advanced to the the                            cecum, identified by appendiceal orifice and                            ileocecal valve. The colonoscopy was performed                            without difficulty. The patient tolerated the                            procedure well. The quality of the bowel                            preparation was poor. The ileocecal valve and the                            rectum were photographed. Scope In: 1:50:39 PM Scope Out: 1:57:05 PM Scope Withdrawal Time: 0 hours 1 minute 31 seconds  Total Procedure Duration: 0 hours 6 minutes 26 seconds  Findings:                 The perianal and digital rectal examinations were                            normal.                           A large amount of semi-liquid stool was found in                            the entire colon, precluding visualization. Prep                            was not too bad in the left colon but markedly  worse in the right and transverse. It was not able                            to be lavaged to clear it and the procedure was                            aborted. No obvious polyps or mass lesions but                            visualization was poor.                           Internal hemorrhoids were found during retroflexion. Complications:            No immediate complications. Estimated blood loss:                            None. Estimated Blood Loss:     Estimated blood loss: none. Impression:               - Preparation of the colon was poor, the colon                            could not be cleared due to stool burden and the                            procedure was aborted                           - Internal hemorrhoids. Recommendation:           - Patient has a contact number available for                            emergencies. The signs and  symptoms of potential                            delayed complications were discussed with the                            patient. Return to normal activities tomorrow.                            Written discharge instructions were provided to the                            patient.                           - Resume previous diet.                           - Continue present medications.                           - Repeat colonoscopy because the bowel preparation  was suboptimal. Recommend 2 day bowel prep for the                            next attempt Viviann Spare P. Deatrice Spanbauer, MD 02/03/2021 2:02:30 PM This report has been signed electronically.

## 2021-02-03 NOTE — Progress Notes (Signed)
Called to room to assist during endoscopic procedure.  Patient ID and intended procedure confirmed with present staff. Received instructions for my participation in the procedure from the performing physician.  

## 2021-02-03 NOTE — Patient Instructions (Signed)
YOU HAD AN ENDOSCOPIC PROCEDURE TODAY AT THE [facility]:   Refer to the procedure report that was given to you for any specific questions about what was found during the examination.  If the procedure report does not answer your questions, please call your gastroenterologist to clarify.  If you requested that your care partner not be given the details of your procedure findings, then the procedure report has been included in a sealed envelope for you to review at your convenience later.  YOU SHOULD EXPECT: Some feelings of bloating in the abdomen. Passage of more gas than usual.  Walking can help get rid of the air that was put into your GI tract during the procedure and reduce the bloating. If you had a lower endoscopy (such as a colonoscopy or flexible sigmoidoscopy) you may notice spotting of blood in your stool or on the toilet paper. If you underwent a bowel prep for your procedure, you may not have a normal bowel movement for a few days.  Please Note:  You might notice some irritation and congestion in your nose or some drainage.  This is from the oxygen used during your procedure.  There is no need for concern and it should clear up in a day or so.  SYMPTOMS TO REPORT IMMEDIATELY:   Following lower endoscopy (colonoscopy or flexible sigmoidoscopy):  Excessive amounts of blood in the stool  Significant tenderness or worsening of abdominal pains  Swelling of the abdomen that is new, acute  Fever of 100F or higher   Following upper endoscopy (EGD)  Vomiting of blood or coffee ground material  New chest pain or pain under the shoulder blades  Painful or persistently difficult swallowing  New shortness of breath  Fever of 100F or higher  Black, tarry-looking stools  For urgent or emergent issues, a gastroenterologist can be reached at any hour by calling [phone]. Do not use MyChart messaging for urgent concerns.    DIET:  We do recommend a small meal at first, but  then you may proceed to your regular diet.  Drink plenty of fluids but you should avoid alcoholic beverages for 24 hours.  ACTIVITY:  You should plan to take it easy for the rest of today and you should NOT DRIVE or use heavy machinery until tomorrow (because of the sedation medicines used during the test).    FOLLOW UP: Our staff will call the number listed on your records 48-72 hours following your procedure to check on you and address any questions or concerns that you may have regarding the information given to you following your procedure. If we do not reach you, we will leave a message.  We will attempt to reach you two times.  During this call, we will ask if you have developed any symptoms of COVID 19. If you develop any symptoms (ie: fever, flu-like symptoms, shortness of breath, cough etc.) before then, please call [phone].  If you test positive for Covid 19 in the 2 weeks post procedure, please call and report this information to us.    If any biopsies were taken you will be contacted by phone or by letter within the next 1-3 weeks.  Please call us at [phone] if you have not heard about the biopsies in 3 weeks.    SIGNATURES/CONFIDENTIALITY: You and/or your care partner have signed paperwork which will be entered into your electronic medical record.  These signatures attest to the fact that that the information above on   your After Visit Summary has been reviewed and is understood.  Full responsibility of the confidentiality of this discharge information lies with you and/or your care-partner. 

## 2021-02-03 NOTE — Op Note (Signed)
[facility] Patient Name: [name] Procedure Date: 02/03/2021 1:20 PM MRN: [id number] Endoscopist: Viviann Spare P. Adela Lank , MD Age: 60 Referring MD:  Date of Birth: [birth year] Gender: Female Account #: [id number] Procedure:                Upper GI endoscopy Indications:              Upper abdominal pain, Nausea with vomiting - noted                            to have gallstones on imaging Medicines:                Monitored Anesthesia Care Procedure:                Pre-Anesthesia Assessment:                           - Prior to the procedure, a History and Physical                            was performed, and patient medications and                            allergies were reviewed. The patient's tolerance of                            previous anesthesia was also reviewed. The risks                            and benefits of the procedure and the sedation                            options and risks were discussed with the patient.                            All questions were answered, and informed consent                            was obtained. Prior Anticoagulants: The patient has                            taken no previous anticoagulant or antiplatelet                            agents. ASA Grade Assessment: III - A patient with                            severe systemic disease. After reviewing the risks                            and benefits, the patient was deemed in                            satisfactory condition to undergo the procedure.  After obtaining informed consent, the endoscope was                            passed under direct vision. Throughout the                            procedure, the patient's blood pressure, pulse, and                            oxygen saturations were monitored continuously. The                            Endoscope was introduced through the mouth, and                            advanced to the  second part of duodenum. The upper                            GI endoscopy was accomplished without difficulty.                            The patient tolerated the procedure well. Scope In: Scope Out: Findings:                 Esophagogastric landmarks were identified: the                            Z-line was found at 37 cm, the gastroesophageal                            junction was found at 37 cm and the upper extent of                            the gastric folds was found at 38 cm from the                            incisors.                           A 1 cm hiatal hernia was present.                           The exam of the esophagus was otherwise normal.                           Patchy mildly erythematous mucosa was found in the                            gastric body with subtle nodularity. Biopsies taken                            with cold forceps.                           The  exam of the stomach was otherwise normal.                           Biopsies were taken with a cold forceps in the                            gastric body, at the incisura and in the gastric                            antrum for Helicobacter pylori testing.                           The duodenal bulb and second portion of the                            duodenum were normal. Complications:            No immediate complications. Estimated blood loss:                            Minimal. Estimated Blood Loss:     Estimated blood loss was minimal. Impression:               - Esophagogastric landmarks identified.                           - 1 cm hiatal hernia.                           - Erythematous mucosa in the gastric body.                           - Normal stomach otherwise - biopsies taken to rule                            out H pylori                           - Normal duodenal bulb and second portion of the                            duodenum.                           No overt pathology noted  on this exam to cause                            symptoms, will rule out H pylori. Possible the                            patient has biliary colic but symptoms not classic                            for this. Would have trial of PPI for dyspepsia to  see if that helps while biopsies pending. Recommendation:           - Patient has a contact number available for                            emergencies. The signs and symptoms of potential                            delayed complications were discussed with the                            patient. Return to normal activities tomorrow.                            Written discharge instructions were provided to the                            patient.                           - Resume previous diet.                           - Continue present medications.                           - Trial of omeprazole 40mg  once daily for 30 days                            if have not tried PPI yet for dyspepsia                           - Await pathology results.                           - Consideration for surgical evaluation for                            possible cholecystectomy / biliary colic if                            symptoms persist, biopsies negative, fails trial of                            PPI [name] P. Kristin Lamagna, MD 02/03/2021 2:09:23 PM This report has been signed electronically.

## 2021-02-07 ENCOUNTER — Telehealth: Payer: Self-pay | Admitting: *Deleted

## 2021-02-07 NOTE — Telephone Encounter (Signed)
Left message on f/u call 

## 2021-02-07 NOTE — Telephone Encounter (Signed)
  Follow up Call-  Call back number 02/03/2021  Post procedure Call Back phone  # [phone]  Permission to leave phone message Yes  Some recent data might be hidden     No answer at 2nd attempt follow up phone call.  Left message on voicemail.

## 2021-02-17 ENCOUNTER — Other Ambulatory Visit: Payer: Self-pay

## 2021-02-17 MED ORDER — OMEPRAZOLE 40 MG PO CPDR: 40.0000 mg | DELAYED_RELEASE_CAPSULE | Freq: Two times a day (BID) | ORAL | 0 refills | Status: DC

## 2021-02-17 MED ORDER — BIS SUBCIT-METRONID-TETRACYC 140-125-125 MG PO CAPS: 3.0000 | ORAL_CAPSULE | Freq: Three times a day (TID) | ORAL | 0 refills | Status: DC

## 2021-02-26 ENCOUNTER — Other Ambulatory Visit: Payer: Self-pay | Admitting: Gastroenterology

## 2021-02-26 DIAGNOSIS — R109 Unspecified abdominal pain: Secondary | ICD-10-CM

## 2021-02-26 DIAGNOSIS — R112 Nausea with vomiting, unspecified: Secondary | ICD-10-CM

## 2021-03-02 ENCOUNTER — Telehealth: Payer: Self-pay

## 2021-03-02 MED ORDER — DOXYCYCLINE HYCLATE 100 MG PO CAPS: 100.0000 mg | ORAL_CAPSULE | Freq: Two times a day (BID) | ORAL | 0 refills | Status: AC

## 2021-03-02 MED ORDER — METRONIDAZOLE 500 MG PO TABS: 500.0000 mg | ORAL_TABLET | Freq: Three times a day (TID) | ORAL | 0 refills | Status: AC

## 2021-03-02 MED ORDER — BISMUTH SUBSALICYLATE 262 MG PO TABS: 524.0000 mg | ORAL_TABLET | Freq: Four times a day (QID) | ORAL | 0 refills | Status: AC

## 2021-03-02 NOTE — Telephone Encounter (Signed)
Inbound call from patient calling in regards to medication.  Please advise.

## 2021-03-02 NOTE — Telephone Encounter (Signed)
Okay sorry to hear this.  If Pylera is not covered we can try this if tetracyline is available for a 10 day course  Bismuth subsalicylate (811 or 572 mg) - Four times daily Tetracycline (500 mg) Four times daily Metronidazole 500mg  TID  If tetracycline is not available can substitute doxycycline 100mg  BID  This is in conjunction with PPI BID. If she is not already on omeprazole or other PPI, please give her omeprazole 40mg  BID to take with this for 10 days. Thanks

## 2021-03-02 NOTE — Telephone Encounter (Signed)
[name] I do not think propofol from the EGD will be causing this, perhaps it is something else she is taking.  She should talk with her primary care about this particular skin issue, is a bit outside our practice.  Hopefully she tolerates antibiotics we give her, we are avoiding penicillin based regimens in light of her allergic history.  Of note this patient belongs to Dr. Hilarie Fredrickson, helped him out with endoscopy for this I am happy to manage the H. pylori regimen for these results but additional questions about other longer term issues should probably go to him.  Thanks

## 2021-03-02 NOTE — Telephone Encounter (Signed)
Spoke with patient in regards to recommendations as outlined below. She is aware that I have sent in the components that she will need to take for 10 days each. Patient has already picked up prescription for Omeprazole BID to take along with antibiotics. Patient states that ever since her EGD she has been "flushing", she states that she is red from the waist to her head. She states that she has been itching severely also. Patient states that she is allergic to benadryl and usually has to check with her doctor before taking any OTC medications because they may interact with her heart medications. Pt denies any new medications other than what we prescribed, no new soaps/lotions. Please advise, thanks.

## 2021-03-02 NOTE — Telephone Encounter (Signed)
Pts pharmacy sent message that insurance will not cover pylera. Please advise.

## 2021-03-03 NOTE — Telephone Encounter (Signed)
Spoke with patient in regards to recommendations, she will reach out to her PCP. She had no concerns at the end of the call.

## 2021-04-11 ENCOUNTER — Ambulatory Visit (AMBULATORY_SURGERY_CENTER): Payer: Self-pay | Admitting: *Deleted

## 2021-04-11 ENCOUNTER — Other Ambulatory Visit: Payer: Self-pay

## 2021-04-11 VITALS — Ht 65.0 in | Wt 207.0 lb

## 2021-04-11 DIAGNOSIS — Z1211 Encounter for screening for malignant neoplasm of colon: Secondary | ICD-10-CM

## 2021-04-11 MED ORDER — PLENVU 140 G PO SOLR: 1.0000 | ORAL | 0 refills | Status: DC

## 2021-04-11 MED ORDER — POLYETHYLENE GLYCOL 3350 17 GM/SCOOP PO POWD
1.0000 | Freq: Every day | ORAL | 3 refills | Status: DC
Start: 2021-04-11 — End: 2021-04-22

## 2021-04-11 MED ORDER — ONDANSETRON HCL 4 MG PO TABS: 4.0000 mg | ORAL_TABLET | ORAL | 0 refills | Status: DC

## 2021-04-11 MED ORDER — DULCOLAX 5 MG PO TBEC
5.0000 mg | DELAYED_RELEASE_TABLET | Freq: Every day | ORAL | 0 refills | Status: DC | PRN
Start: 2021-04-11 — End: 2022-10-03

## 2021-04-11 NOTE — Progress Notes (Addendum)
No egg or soy allergy known to patient  No issues with past sedation with any surgeries or procedures Patient denies ever being told they had issues or difficulty with intubation  No FH of Malignant Hyperthermia No diet pills per patient No home 02 use per patient  No blood thinners per patient - OFF {Plavix  Pt denies issues with constipation - last colon 01-2021 poor prep- Did a 2 day Plenvu prep with Zofran prior to each prep dose due to nausea with last suprep  No A fib or A flutter  EMMI video to pt or via [place] 19 guidelines implemented in [place] today with Pt and RN   Pt is not  vaccinated  for Covid    Due to the COVID-19 pandemic we are asking patients to follow certain guidelines.  Pt aware of COVID protocols and [facility] guidelines

## 2021-04-20 ENCOUNTER — Telehealth: Payer: Self-pay | Admitting: Gastroenterology

## 2021-04-20 DIAGNOSIS — Z1211 Encounter for screening for malignant neoplasm of colon: Secondary | ICD-10-CM

## 2021-04-20 MED ORDER — PLENVU 140 G PO SOLR: 1.0000 | Freq: Once | ORAL | 0 refills | Status: AC

## 2021-04-20 NOTE — Telephone Encounter (Signed)
Inbound call from patient. Have procedure 9/2 with 2 day prep. Pharmacy states plenvu is on back order. Patient would like a call with other options since she was to start prep at 3pm 8/31

## 2021-04-20 NOTE — Telephone Encounter (Signed)
Spoke with the patient. She will try to get the [facility] [address] today new Rx sent. She will call me back if she can not get it from that pharmacy today!

## 2021-04-20 NOTE — Telephone Encounter (Signed)
[facility]. The pharmacist told me the [facility] [address] [facility] Plenvu-I called to confirm. The patient will go to [facility] [address] to pick the Plenvu up today.

## 2021-04-22 ENCOUNTER — Encounter: Payer: Self-pay | Admitting: Gastroenterology

## 2021-04-22 ENCOUNTER — Ambulatory Visit (AMBULATORY_SURGERY_CENTER): Payer: Medicare Other | Admitting: Gastroenterology

## 2021-04-22 ENCOUNTER — Other Ambulatory Visit: Payer: Self-pay

## 2021-04-22 VITALS — BP 144/78 | HR 64 | Temp 97.8°F | Resp 12 | Ht 65.0 in | Wt 207.0 lb

## 2021-04-22 DIAGNOSIS — D123 Benign neoplasm of transverse colon: Secondary | ICD-10-CM | POA: Diagnosis not present

## 2021-04-22 DIAGNOSIS — D124 Benign neoplasm of descending colon: Secondary | ICD-10-CM

## 2021-04-22 DIAGNOSIS — D127 Benign neoplasm of rectosigmoid junction: Secondary | ICD-10-CM

## 2021-04-22 DIAGNOSIS — D125 Benign neoplasm of sigmoid colon: Secondary | ICD-10-CM

## 2021-04-22 DIAGNOSIS — Z1211 Encounter for screening for malignant neoplasm of colon: Secondary | ICD-10-CM

## 2021-04-22 MED ORDER — SODIUM CHLORIDE 0.9 % IV SOLN: 500.0000 mL | Freq: Once | INTRAVENOUS | Status: DC

## 2021-04-22 NOTE — Op Note (Signed)
[facility] Patient Name: [name] Procedure Date: 04/22/2021 11:49 AM MRN: [id number] Endoscopist: Viviann Spare P. Adela Lank , MD Age: 60 Referring MD:  Date of Birth: [birth year] Gender: Female Account #: [id number] Procedure:                Colonoscopy Indications:              Screening for colorectal malignant neoplasm - last                            exam limited by poor prep, chronic constipation -                            double prep used for this exam Medicines:                Monitored Anesthesia Care Procedure:                Pre-Anesthesia Assessment:                           - Prior to the procedure, a History and Physical                            was performed, and patient medications and                            allergies were reviewed. The patient's tolerance of                            previous anesthesia was also reviewed. The risks                            and benefits of the procedure and the sedation                            options and risks were discussed with the patient.                            All questions were answered, and informed consent                            was obtained. Prior Anticoagulants: The patient has                            taken no previous anticoagulant or antiplatelet                            agents. ASA Grade Assessment: III - A patient with                            severe systemic disease. After reviewing the risks                            and benefits, the patient was deemed in  satisfactory condition to undergo the procedure.                           After obtaining informed consent, the colonoscope                            was passed under direct vision. Throughout the                            procedure, the patient's blood pressure, pulse, and                            oxygen saturations were monitored continuously. The                            [id number]-HQ190L  Colonoscope was introduced through the                            anus and advanced to the the cecum, identified by                            appendiceal orifice and ileocecal valve. The                            colonoscopy was performed without difficulty. The                            patient tolerated the procedure well. The quality                            of the bowel preparation was good. The ileocecal                            valve, appendiceal orifice, and rectum were                            photographed. Scope In: 11:59:42 AM Scope Out: 12:19:21 PM Scope Withdrawal Time: 0 hours 12 minutes 40 seconds  Total Procedure Duration: 0 hours 19 minutes 39 seconds  Findings:                 The perianal and digital rectal examinations were                            normal.                           A 4 mm polyp was found in the ascending colon. The                            polyp was sessile. The polyp was removed with a                            cold snare. Resection and retrieval were complete.  Two sessile polyps were found in the transverse                            colon. The polyps were 4 mm in size. These polyps                            were removed with a cold snare. Resection and                            retrieval were complete.                           A single small angiodysplastic lesion was found in                            the transverse colon.                           Two sessile polyps were found in the sigmoid colon.                            The polyps were 3 to 6 mm in size. These polyps                            were removed with a cold snare. Resection and                            retrieval were complete.                           Two sessile polyps were found in the recto-sigmoid                            colon. The polyps were 2 to 3 mm in size. These                            polyps were removed with a cold  snare. Resection                            and retrieval were complete.                           Internal hemorrhoids were found during                            retroflexion. The hemorrhoids were small.                           The exam was otherwise without abnormality. Complications:            No immediate complications. Estimated blood loss:                            Minimal. Estimated Blood Loss:     Estimated  blood loss was minimal. Impression:               - One 4 mm polyp in the ascending colon, removed                            with a cold snare. Resected and retrieved.                           - Two 4 mm polyps in the transverse colon, removed                            with a cold snare. Resected and retrieved.                           - A single colonic angiodysplastic lesion.                           - Two 3 to 6 mm polyps in the sigmoid colon,                            removed with a cold snare. Resected and retrieved.                           - Two 2 to 3 mm polyps at the recto-sigmoid colon,                            removed with a cold snare. Resected and retrieved.                           - Internal hemorrhoids.                           - The examination was otherwise normal. Recommendation:           - Patient has a contact number available for                            emergencies. The signs and symptoms of potential                            delayed complications were discussed with the                            patient. Return to normal activities tomorrow.                            Written discharge instructions were provided to the                            patient.                           - Resume previous diet.                           -  Continue present medications.                           - Await pathology results. Viviann Spare P. Kyllie Pettijohn, MD 04/22/2021 12:26:00 PM This report has been signed electronically.

## 2021-04-22 NOTE — Patient Instructions (Signed)
Resume previous diet and present medications. Awaiting pathology results. Repeat colonoscopy date to be determined based on pathology results.  YOU HAD AN ENDOSCOPIC PROCEDURE TODAY AT [facility]:   Refer to the procedure report that was given to you for any specific questions about what was found during the examination.  If the procedure report does not answer your questions, please call your gastroenterologist to clarify.  If you requested that your care partner not be given the details of your procedure findings, then the procedure report has been included in a sealed envelope for you to review at your convenience later.  YOU SHOULD EXPECT: Some feelings of bloating in the abdomen. Passage of more gas than usual.  Walking can help get rid of the air that was put into your GI tract during the procedure and reduce the bloating. If you had a lower endoscopy (such as a colonoscopy or flexible sigmoidoscopy) you may notice spotting of blood in your stool or on the toilet paper. If you underwent a bowel prep for your procedure, you may not have a normal bowel movement for a few days.  Please Note:  You might notice some irritation and congestion in your nose or some drainage.  This is from the oxygen used during your procedure.  There is no need for concern and it should clear up in a day or so.  SYMPTOMS TO REPORT IMMEDIATELY:  Following lower endoscopy (colonoscopy or flexible sigmoidoscopy):  Excessive amounts of blood in the stool  Significant tenderness or worsening of abdominal pains  Swelling of the abdomen that is new, acute  Fever of 100F or higher  For urgent or emergent issues, a gastroenterologist can be reached at any hour by calling [phone]. Do not use MyChart messaging for urgent concerns.    DIET:  We do recommend a small meal at first, but then you may proceed to your regular diet.  Drink plenty of fluids but you should avoid alcoholic beverages for 24  hours.  ACTIVITY:  You should plan to take it easy for the rest of today and you should NOT DRIVE or use heavy machinery until tomorrow (because of the sedation medicines used during the test).    FOLLOW UP: Our staff will call the number listed on your records 48-72 hours following your procedure to check on you and address any questions or concerns that you may have regarding the information given to you following your procedure. If we do not reach you, we will leave a message.  We will attempt to reach you two times.  During this call, we will ask if you have developed any symptoms of COVID 19. If you develop any symptoms (ie: fever, flu-like symptoms, shortness of breath, cough etc.) before then, please call [phone].  If you test positive for Covid 19 in the 2 weeks post procedure, please call and report this information to [place].    If any biopsies were taken you will be contacted by phone or by letter within the next 1-3 weeks.  Please call us at [phone] if you have not heard about the biopsies in 3 weeks.    SIGNATURES/CONFIDENTIALITY: You and/or your care partner have signed paperwork which will be entered into your electronic medical record.  These signatures attest to the fact that that the information above on your After Visit Summary has been reviewed and is understood.  Full responsibility of the confidentiality of this discharge information lies with you and/or your care-partner.

## 2021-04-22 NOTE — Progress Notes (Signed)
To PACU, VSS. Report to [email or web address] 

## 2021-04-22 NOTE — Progress Notes (Signed)
VS completed by CW.   Pt's states no medical or surgical changes since previsit or office visit.  

## 2021-04-22 NOTE — Progress Notes (Signed)
Called to room to assist during endoscopic procedure.  Patient ID and intended procedure confirmed with present staff. Received instructions for my participation in the procedure from the performing physician.  

## 2021-04-22 NOTE — Progress Notes (Signed)
[facility] History and Physical   Primary Care Physician:  Katherina Mires, MD   Reason for Procedure:   Screening   Plan:    colonoscopy     HPI: [name] is a 60 y.o. female here for colon cancer screening. 2 day prep used given failed colonoscopy in June due to pooor prep. She states it worked well this time. She has chronic constipation, otherwise no bleeding. No FH of CRC. She is off Plavix. She denies cardiopulmonary symptoms.   Past Medical History:  Diagnosis Date   Allergy    Anemia    takes iron supplement   Arthritis    knees, shoulders   Asthma    Bipolar 1 disorder (HCC)    Blood transfusion without reported diagnosis    Cataract, immature    CHF (congestive heart failure) (HCC)    Clotting disorder (HCC)    COPD (chronic obstructive pulmonary disease) (HCC)    Coronary artery disease    triple vessel   Family history of adverse reaction to anesthesia    states father "did not do well" with anesthesia; died last time he had surgery; also had heart disease   Gallstones    GERD (gastroesophageal reflux disease)    occasional - no current med.   Glaucoma    Hammertoe 05/2015   right second toe   Heart murmur    Hepatic steatosis    High cholesterol    History of MI (myocardial infarction)    Hypertension    states under control with meds., has been on med. x 8 yr.   Myocardial infarction ([place])    Osteoporosis    Panic attacks    Peripheral vascular disease (HCC)    carotid   Renal cyst, left    Schizophrenia (HCC)    Short of breath on exertion    Sleep apnea    no CPAP use   Urinary, incontinence, stress female    Wears dentures    upper   Wears partial dentures    lower    Past Surgical History:  Procedure Laterality Date   C-SECTIONS X [place]  12/10/2006; 12/18/2006; 07/10/2007; 07/19/2009   CARDIAC CATHETERIZATION  03/04/2010   patent grafts, normal LV function, diffusely diseased  RCA (Dr. Adora Fridge)   CARDIAC CATHETERIZATION N/A 02/15/2016   Procedure: Left Heart Cath and Cors/Grafts Angiography;  Surgeon: Peter M Martinique, MD;  Location: [facility];  Service: Cardiovascular;  Laterality: N/A;   CLUB FOOT RELEASE Bilateral    1st and 2nd toes   COLONOSCOPY     CORONARY ARTERY BYPASS GRAFT  07/16/2007   LIMA to LAD, VG to ramus, VG to PDA    CORONARY STENT INTERVENTION N/A 06/23/2019   Procedure: CORONARY STENT INTERVENTION;  Surgeon: Lorretta Harp, MD;  Location: [place] [facility];  Service: Cardiovascular;  Laterality: N/A;   FLEXIBLE BRONCHOSCOPY  07/16/2007   HAMMERTOE RECONSTRUCTION WITH WEIL OSTEOTOMY Right 06/03/2015   Procedure: RIGHT SECOND HAMMERTOE CORRECTION WITH MT WEIL OSTEOTOMY;  Surgeon: Wylene Simmer, MD;  Location: [place];  Service: Orthopedics;  Laterality: Right;   HAND SURGERY Bilateral    1st and 2nd fingers - release of clubbing   KNEE ARTHROSCOPY WITH ANTERIOR CRUCIATE LIGAMENT (ACL) REPAIR Right 01/30/2008   LEFT HEART CATH AND CORS/GRAFTS ANGIOGRAPHY N/A 06/23/2019   Procedure: LEFT HEART CATH AND CORS/GRAFTS ANGIOGRAPHY;  Surgeon: Lorretta Harp, MD;  Location:  [place] [facility];  Service: Cardiovascular;  Laterality: N/A;   LEFT HEART CATH AND CORS/GRAFTS ANGIOGRAPHY N/A 03/01/2020   Procedure: LEFT HEART CATH AND CORS/GRAFTS ANGIOGRAPHY;  Surgeon: Belva Crome, MD;  Location: [place] [facility];  Service: Cardiovascular;  Laterality: N/A;   NOVASURE ABLATION  12/19/2007   with removal of IUD   ORIF FOOT FRACTURE Left    TOE SURGERIES TO REMOVE NAILS DUE TO CLUBBING     TOTAL KNEE ARTHROPLASTY Right 10/21/2012   Procedure: TOTAL KNEE ARTHROPLASTY;  Surgeon: Mauri Pole, MD;  Location: [facility];  Service: Orthopedics;  Laterality: Right;   UPPER GASTROINTESTINAL ENDOSCOPY      Prior to Admission medications   Medication Sig Start Date End Date Taking? Authorizing Provider  albuterol (PROVENTIL  HFA;VENTOLIN HFA) 108 (90 BASE) MCG/ACT inhaler Inhale 2 puffs into the lungs every 6 (six) hours as needed for wheezing or shortness of breath.    Yes [provider]  amLODipine (NORVASC) 2.5 MG tablet Take 1 tablet (2.5 mg total) by mouth daily. 11/05/20  Yes Lorretta Harp, MD  aspirin 81 MG chewable tablet Chew 81 mg by mouth daily.   Yes [provider]  atorvastatin (LIPITOR) 80 MG tablet Take 1 tablet (80 mg total) by mouth daily. 08/05/20  Yes Lorretta Harp, MD  bisacodyl (DULCOLAX) 5 MG EC tablet Take 1 tablet (5 mg total) by mouth daily as needed for moderate constipation. 5 mg laxative tablets x 4 only 04/11/21  Yes Billiejo Sorto, Carlota Raspberry, MD  BREO ELLIPTA 100-25 MCG/INH AEPB Inhale 1 puff into the lungs daily.  07/29/19  Yes [provider]  brimonidine (ALPHAGAN) 0.15 % ophthalmic solution Place 1 drop into both eyes 2 (two) times daily.    Yes [provider]  cholecalciferol (VITAMIN D3) 25 MCG (1000 UNIT) tablet Take 1,000 Units by mouth daily.   Yes [provider]  escitalopram (LEXAPRO) 10 MG tablet Take 10 mg by mouth daily. 05/31/19  Yes [provider]  fluticasone (FLONASE) 50 MCG/ACT nasal spray Place 1 spray into both nostrils daily. 06/13/19  Yes [provider]  isosorbide mononitrate (IMDUR) 30 MG 24 hr tablet Take 3 tablets (90 mg total) by mouth daily. 03/07/20 04/22/21 Yes Little Ishikawa, MD  lamoTRIgine (LAMICTAL) 150 MG tablet Take 150 mg by mouth daily.  08/05/19  Yes [provider]  metoprolol tartrate (LOPRESSOR) 25 MG tablet Take 0.5 tablets (12.5 mg total) by mouth 2 (two) times daily. 11/05/20  Yes Lorretta Harp, MD  niacin (NIASPAN) 1000 MG CR tablet TAKE 1 TABLET (1,000 MG TOTAL) BY MOUTH AT BEDTIME. 03/24/20  Yes Lorretta Harp, MD  ondansetron (ZOFRAN) 4 MG tablet Take 1 tablet (4 mg total) by mouth as directed. 04/11/21  Yes [place] Capers, Carlota Raspberry, MD  PRALUENT 150 MG/ML SOAJ  INJECT 1 PEN INTO THE SKIN EVERY 14 (FOURTEEN) DAYS. 11/08/20  Yes Lorretta Harp, MD  QUEtiapine (SEROQUEL) 200 MG tablet Take 125 mg by mouth at bedtime. Takes 1 1/4 tablet (125 mg) 05/31/19  Yes [provider]  VASCEPA 1 g capsule TAKE 2 CAPSULES (2 G TOTAL) BY MOUTH 2 (TWO) TIMES DAILY. 06/21/20  Yes Lorretta Harp, MD  vitamin B-12 (CYANOCOBALAMIN) 1000 MCG tablet Take 1,000 mcg by mouth daily.   Yes [provider]  vitamin E 1000 UNIT capsule Take 1,000 Units by mouth daily.   Yes [provider]  clopidogrel (PLAVIX) 75 MG tablet  Take 1 tablet (75 mg total) by mouth daily with breakfast. Patient not taking: No sig reported 06/26/19   Furth, Cadence H, PA-C  linaclotide (LINZESS) 145 MCG CAPS capsule Take 145 mcg by mouth daily before breakfast. Patient not taking: No sig reported    [provider]  nitroGLYCERIN (NITROSTAT) 0.4 MG SL tablet Place 1 tablet (0.4 mg total) under the tongue every 5 (five) minutes as needed for chest pain. 07/03/19   Erlene Quan, PA-C  omeprazole (PRILOSEC) 40 MG capsule Take 1 capsule (40 mg total) by mouth daily. Patient not taking: Reported on 04/22/2021 02/03/21   Yetta Flock, MD  omeprazole (PRILOSEC) 40 MG capsule Take 1 capsule (40 mg total) by mouth in the morning and at bedtime. Patient not taking: Reported on 04/22/2021 02/17/21   Yetta Flock, MD  ondansetron (ZOFRAN-ODT) 8 MG disintegrating tablet Take 8 mg by mouth every 8 (eight) hours as needed for nausea or vomiting. 02/26/20   [provider]    Current Outpatient Medications  Medication Sig Dispense Refill   albuterol (PROVENTIL HFA;VENTOLIN HFA) 108 (90 BASE) MCG/ACT inhaler Inhale 2 puffs into the lungs every 6 (six) hours as needed for wheezing or shortness of breath.      amLODipine (NORVASC) 2.5 MG tablet Take 1 tablet (2.5 mg total) by mouth daily. 90 tablet 3   aspirin 81 MG chewable tablet Chew 81 mg by mouth daily.      atorvastatin (LIPITOR) 80 MG tablet Take 1 tablet (80 mg total) by mouth daily. 90 tablet 2   bisacodyl (DULCOLAX) 5 MG EC tablet Take 1 tablet (5 mg total) by mouth daily as needed for moderate constipation. 5 mg laxative tablets x 4 only 4 tablet 0   BREO ELLIPTA 100-25 MCG/INH AEPB Inhale 1 puff into the lungs daily.      brimonidine (ALPHAGAN) 0.15 % ophthalmic solution Place 1 drop into both eyes 2 (two) times daily.      cholecalciferol (VITAMIN D3) 25 MCG (1000 UNIT) tablet Take 1,000 Units by mouth daily.     escitalopram (LEXAPRO) 10 MG tablet Take 10 mg by mouth daily.     fluticasone (FLONASE) 50 MCG/ACT nasal spray Place 1 spray into both nostrils daily.     isosorbide mononitrate (IMDUR) 30 MG 24 hr tablet Take 3 tablets (90 mg total) by mouth daily. 90 tablet 0   lamoTRIgine (LAMICTAL) 150 MG tablet Take 150 mg by mouth daily.      metoprolol tartrate (LOPRESSOR) 25 MG tablet Take 0.5 tablets (12.5 mg total) by mouth 2 (two) times daily. 90 tablet 3   niacin (NIASPAN) 1000 MG CR tablet TAKE 1 TABLET (1,000 MG TOTAL) BY MOUTH AT BEDTIME. 90 tablet 3   ondansetron (ZOFRAN) 4 MG tablet Take 1 tablet (4 mg total) by mouth as directed. 3 tablet 0   PRALUENT 150 MG/ML SOAJ INJECT 1 PEN INTO THE SKIN EVERY 14 (FOURTEEN) DAYS. 6 mL 3   QUEtiapine (SEROQUEL) 200 MG tablet Take 125 mg by mouth at bedtime. Takes 1 1/4 tablet (125 mg)     VASCEPA 1 g capsule TAKE 2 CAPSULES (2 G TOTAL) BY MOUTH 2 (TWO) TIMES DAILY. 120 capsule 2   vitamin B-12 (CYANOCOBALAMIN) 1000 MCG tablet Take 1,000 mcg by mouth daily.     vitamin E 1000 UNIT capsule Take 1,000 Units by mouth daily.     clopidogrel (PLAVIX) 75 MG tablet Take 1 tablet (75 mg total) by mouth daily  with breakfast. (Patient not taking: No sig reported) 90 tablet 3   linaclotide (LINZESS) 145 MCG CAPS capsule Take 145 mcg by mouth daily before breakfast. (Patient not taking: No sig reported)     nitroGLYCERIN (NITROSTAT) 0.4 MG SL tablet Place 1  tablet (0.4 mg total) under the tongue every 5 (five) minutes as needed for chest pain. 25 tablet 3   omeprazole (PRILOSEC) 40 MG capsule Take 1 capsule (40 mg total) by mouth daily. (Patient not taking: Reported on 04/22/2021) 30 capsule 0   omeprazole (PRILOSEC) 40 MG capsule Take 1 capsule (40 mg total) by mouth in the morning and at bedtime. (Patient not taking: Reported on 04/22/2021) 20 capsule 0   ondansetron (ZOFRAN-ODT) 8 MG disintegrating tablet Take 8 mg by mouth every 8 (eight) hours as needed for nausea or vomiting.     Current Facility-Administered Medications  Medication Dose Route Frequency Provider Last Rate Last Admin   0.9 %  sodium chloride infusion  500 mL Intravenous Once Donnisha Besecker, Carlota Raspberry, MD        Allergies as of 04/22/2021 - Review Complete 04/22/2021  Allergen Reaction Noted   Benadryl [diphenhydramine hcl] Shortness Of Breath and Swelling 06/03/2015   Codeine Other (See Comments) 11/18/2011   Haldol [haloperidol decanoate] Anaphylaxis 11/18/2011   Amoxicillin Hives and Swelling 11/18/2011   Bee venom Swelling and Other (See Comments) 11/16/2015    Family History  Problem Relation Age of Onset   Lung cancer Mother    Kidney cancer Mother    Heart disease Father    Anesthesia problems Father        states "did not do well" with anesthesia   Colon polyps Sister    Stomach cancer Neg Hx    Esophageal cancer Neg Hx    Pancreatic cancer Neg Hx    Rectal cancer Neg Hx    Colon cancer Neg Hx     Social History   Socioeconomic History   Marital status: Divorced    Spouse name: Not on file   Number of children: Not on file   Years of education: Not on file   Highest education level: Not on file  Occupational History   Not on file  Tobacco Use   Smoking status: Former    Packs/day: 0.00    Years: 0.00    Pack years: 0.00    Types: Cigarettes    Quit date: 05/21/2015    Years since quitting: 5.9   Smokeless tobacco: Never   Tobacco comments:    pt  states she quit a week ago 06/14/19  Vaping Use   Vaping Use: Never used  Substance and Sexual Activity   Alcohol use: No   Drug use: No   Sexual activity: Not on file  Other Topics Concern   Not on file  Social History Narrative   Not on file   Social Determinants of Health   Financial Resource Strain: Not on file  Food Insecurity: Not on file  Transportation Needs: Not on file  Physical Activity: Not on file  Stress: Not on file  Social Connections: Not on file  Intimate Partner Violence: Not on file    Review of Systems: All other review of systems negative except as mentioned in the HPI.  Physical Exam: Vital signs BP (!) 145/70   Pulse 76   Temp 97.8 F (36.6 C)   Ht '5\' 5"'$  (1.651 m)   Wt 207 lb (93.9 kg)   LMP 11/16/2011  SpO2 97%   BMI 34.45 kg/m   General:   Alert,  Well-developed, well-nourished, pleasant and cooperative in NAD Lungs:  Clear throughout to auscultation.   Heart:  Regular rate and rhythm;  Abdomen:  Soft, nontender and nondistended.  Neuro/Psych:  Alert and cooperative. Normal mood and affect. A and O x 3  Jolly Mango, MD [facility]

## 2021-04-23 ENCOUNTER — Other Ambulatory Visit: Payer: Self-pay | Admitting: Cardiovascular Disease

## 2021-04-23 DIAGNOSIS — I10 Essential (primary) hypertension: Secondary | ICD-10-CM

## 2021-04-23 DIAGNOSIS — E785 Hyperlipidemia, unspecified: Secondary | ICD-10-CM

## 2021-04-26 ENCOUNTER — Telehealth: Payer: Self-pay | Admitting: *Deleted

## 2021-04-26 ENCOUNTER — Telehealth: Payer: Self-pay

## 2021-04-26 NOTE — Telephone Encounter (Signed)
Left message on follow up call. 

## 2021-04-26 NOTE — Telephone Encounter (Signed)
  Follow up Call-  Call back number 04/22/2021 02/03/2021  Post procedure Call Back phone  # [phone]  Permission to leave phone message Yes Yes  Some recent data might be hidden     Patient questions:  Do you have a fever, pain , or abdominal swelling? No. Pain Score  0 *  Have you tolerated food without any problems? Yes.    Have you been able to return to your normal activities? Yes.    Do you have any questions about your discharge instructions: Diet   No. Medications  No. Follow up visit  No.  Do you have questions or concerns about your Care? No.  Actions: * If pain score is 4 or above: No action needed, pain <4.  Have you developed a fever since your procedure? no  2.   Have you had an respiratory symptoms (SOB or cough) since your procedure? no  3.   Have you tested positive for COVID 19 since your procedure no  4.   Have you had any family members/close contacts diagnosed with the COVID 19 since your procedure?  no   If yes to any of these questions please route to Joylene John, RN and Joella Prince, RN

## 2021-04-28 ENCOUNTER — Other Ambulatory Visit: Payer: Self-pay | Admitting: Cardiovascular Disease

## 2021-04-30 ENCOUNTER — Encounter: Payer: Self-pay | Admitting: Student

## 2021-04-30 NOTE — Progress Notes (Signed)
Cardiology Office Note:    Date:  05/06/2021   ID:  [name], DOB 03-Aug-1961, MRN [id number]  PCP:  Katherina Mires, MD  Cardiologist:  Quay Burow, MD  Electrophysiologist:  None   Referring MD: Katherina Mires, MD   Chief Complaint: follow-up of CAD  History of Present Illness:    [name] is a 60 y.o. female with a history of CAD s/p CABG x3 (LIMA-LAD, SVG-[place] branch, SVG-PDA) in 06/2007 with subsequent stenting to SVG-PDA in 06/2019,  COPD, hypertension, hyperlipidemia, obstructive sleep apnea not on CPAP, GERD, and bipolar disorder who is followed by Dr. Gwenlyn Found and presents today for routine follow-up.   Patient has a long history of CAD with prior stenting to RCA by Dr. Doylene Canard in the remote past and then CABG x3 with LIMA to LAD, SVG to ramus brahc and SVG to PDA in 06/2007. Patient was admitted in 05/2019 with unstable angina. Cardiac catheterization at that time showed severe native CAD with 50% of proximal to mid followed by 95% stenosis of distal SVG to PDA. She underwent successful PCI with 3 overlapping DES to distal SVG to PDA lesion. She was treated with Aggrastat for 18 hours following this. She was readmitted in 02/2020 with NSTEMI. Cardiac cath showed patent LIMA to LAD,  diffusely diseased but patent SVG to ramus intermedius, and total occlusion of the mid and distal segments of the SVG to PDA with left to right collaterals. Echo showed 55-60% with normal wall motion. Continued medical therapy was recommended and her Ranexa was increased.   Patient was last seen by Dr. Gwenlyn Found in 10/2020 at which time she continued to have some chest pain occasionally but this was stable. She was also having some GI issues.   Patient presents today for follow-up. Here alone. Patient states she keeps having "mini heart attacks." However, when I asked her to elaborate it sounds like she is having stable angina. Patient continues to have chest pain with activities such as walking  longer distances or shaking out her heavy bed comforter. She describes this as chest heaviness with some left arm pain, back pain, and neck pain. However, she states this is stable and unchanged from prior visit. Chest pain usually resolves pretty quickly with rest or one dose of sublingual Nitro. She stats she has to take Nitro about 3-4 times per month. She states that if she is still having pain after 2 doses, she goes to the ED. She also reports shortness of breath at rest and activity but again states this is stable. She describes orthopnea and sleeping on an incline but states she has done this for about 2 years. She also states she has suddenly woken up short of breath multiple times. She does snore and has a history of sleep apnea but is not on CPAP. Her PCP is already planning on ordering another sleep study. She denies any palpitations. She does describe multiple episodes of dizziness with standing and states she has passed out before. She reports a few syncopal episodes over the last couple of years, most recently in May after standing from using the restroom. She states she felt dizzy, cold, and clammy and then passed out for 5-10 seconds. No palpitations prior to this. This sounds like orthostatic hypotension/vasovagal in nature. No abnormal bleeding in urine or stools. She also notes a lot of GI issues. She has had a lot of abdominal bloating over the last year. She follows with GI and was recently  found to have several polyps. Overall, it sounds like her chronic chest pain and shortness of breath is stable. We discussed the difference between stable angina and unstable angina.  Past Medical History:  Diagnosis Date   Allergy    Anemia    takes iron supplement   Arthritis    knees, shoulders   Asthma    Bipolar 1 disorder (HCC)    Blood transfusion without reported diagnosis    Cataract, immature    Clotting disorder (HCC)    COPD (chronic obstructive pulmonary disease) (HCC)    Coronary  artery disease    triple vessel   Family history of adverse reaction to anesthesia    states father "did not do well" with anesthesia; died last time he had surgery; also had heart disease   Gallstones    GERD (gastroesophageal reflux disease)    occasional - no current med.   Glaucoma    Hammertoe 05/2015   right second toe   Heart murmur    Hepatic steatosis    High cholesterol    History of MI (myocardial infarction)    Hypertension    states under control with meds., has been on med. x 8 yr.   Myocardial infarction ([place])    Osteoporosis    Panic attacks    Peripheral vascular disease (HCC)    carotid   Renal cyst, left    Schizophrenia (HCC)    Short of breath on exertion    Sleep apnea    no CPAP use   Urinary, incontinence, stress female    Wears dentures    upper   Wears partial dentures    lower    Past Surgical History:  Procedure Laterality Date   C-SECTIONS X [place]  12/10/2006; 12/18/2006; 07/10/2007; 07/19/2009   CARDIAC CATHETERIZATION  03/04/2010   patent grafts, normal LV function, diffusely diseased RCA (Dr. Adora Fridge)   CARDIAC CATHETERIZATION N/A 02/15/2016   Procedure: Left Heart Cath and Cors/Grafts Angiography;  Surgeon: Peter M Martinique, MD;  Location: [facility];  Service: Cardiovascular;  Laterality: N/A;   CLUB FOOT RELEASE Bilateral    1st and 2nd toes   COLONOSCOPY     CORONARY ARTERY BYPASS GRAFT  07/16/2007   LIMA to LAD, VG to ramus, VG to PDA    CORONARY STENT INTERVENTION N/A 06/23/2019   Procedure: CORONARY STENT INTERVENTION;  Surgeon: Lorretta Harp, MD;  Location: [facility];  Service: Cardiovascular;  Laterality: N/A;   FLEXIBLE BRONCHOSCOPY  07/16/2007   HAMMERTOE RECONSTRUCTION WITH WEIL OSTEOTOMY Right 06/03/2015   Procedure: RIGHT SECOND HAMMERTOE CORRECTION WITH MT WEIL OSTEOTOMY;  Surgeon: Wylene Simmer, MD;  Location: [facility];  Service: Orthopedics;   Laterality: Right;   HAND SURGERY Bilateral    1st and 2nd fingers - release of clubbing   KNEE ARTHROSCOPY WITH ANTERIOR CRUCIATE LIGAMENT (ACL) REPAIR Right 01/30/2008   LEFT HEART CATH AND CORS/GRAFTS ANGIOGRAPHY N/A 06/23/2019   Procedure: LEFT HEART CATH AND CORS/GRAFTS ANGIOGRAPHY;  Surgeon: Lorretta Harp, MD;  Location: [facility];  Service: Cardiovascular;  Laterality: N/A;   LEFT HEART CATH AND CORS/GRAFTS ANGIOGRAPHY N/A 03/01/2020   Procedure: LEFT HEART CATH AND CORS/GRAFTS ANGIOGRAPHY;  Surgeon: Belva Crome, MD;  Location: [facility];  Service: Cardiovascular;  Laterality: N/A;   NOVASURE ABLATION  12/19/2007   with removal of IUD   ORIF FOOT FRACTURE Left  TOE SURGERIES TO REMOVE NAILS DUE TO CLUBBING     TOTAL KNEE ARTHROPLASTY Right 10/21/2012   Procedure: TOTAL KNEE ARTHROPLASTY;  Surgeon: Mauri Pole, MD;  Location: [facility];  Service: Orthopedics;  Laterality: Right;   UPPER GASTROINTESTINAL ENDOSCOPY      Current Medications: Current Meds  Medication Sig   albuterol (PROVENTIL HFA;VENTOLIN HFA) 108 (90 BASE) MCG/ACT inhaler Inhale 2 puffs into the lungs every 6 (six) hours as needed for wheezing or shortness of breath.    aspirin 81 MG chewable tablet Chew 81 mg by mouth daily.   atorvastatin (LIPITOR) 80 MG tablet Take 1 tablet (80 mg total) by mouth daily.   BREO ELLIPTA 100-25 MCG/INH AEPB Inhale 1 puff into the lungs daily.    brimonidine (ALPHAGAN) 0.15 % ophthalmic solution Place 1 drop into both eyes 2 (two) times daily.    cholecalciferol (VITAMIN D3) 25 MCG (1000 UNIT) tablet Take 1,000 Units by mouth daily.   escitalopram (LEXAPRO) 10 MG tablet Take 10 mg by mouth daily.   fluticasone (FLONASE) 50 MCG/ACT nasal spray Place 1 spray into both nostrils daily.   isosorbide mononitrate (IMDUR) 30 MG 24 hr tablet Take 3 tablets (90 mg total) by mouth daily.   lamoTRIgine (LAMICTAL) 150 MG tablet Take 150 mg by mouth daily.    linaclotide  (LINZESS) 145 MCG CAPS capsule Take 145 mcg by mouth daily before breakfast.   metoprolol tartrate (LOPRESSOR) 25 MG tablet Take 0.5 tablets (12.5 mg total) by mouth 2 (two) times daily.   niacin (NIASPAN) 1000 MG CR tablet TAKE 1 TABLET (1,000 MG TOTAL) BY MOUTH AT BEDTIME.   ondansetron (ZOFRAN) 4 MG tablet Take 1 tablet (4 mg total) by mouth as directed.   PRALUENT 150 MG/ML SOAJ INJECT 1 PEN INTO THE SKIN EVERY 14 (FOURTEEN) DAYS.   QUEtiapine (SEROQUEL) 200 MG tablet Take 125 mg by mouth at bedtime. Takes 1 1/4 tablet (125 mg)   VASCEPA 1 g capsule TAKE 2 CAPSULES (2 G TOTAL) BY MOUTH 2 (TWO) TIMES DAILY.   vitamin B-12 (CYANOCOBALAMIN) 1000 MCG tablet Take 1,000 mcg by mouth daily.   vitamin E 1000 UNIT capsule Take 1,000 Units by mouth daily.   [DISCONTINUED] amLODipine (NORVASC) 2.5 MG tablet Take 1 tablet (2.5 mg total) by mouth daily.   [DISCONTINUED] amLODipine (NORVASC) 5 MG tablet Take 1 tablet (5 mg total) by mouth daily.   [DISCONTINUED] clopidogrel (PLAVIX) 75 MG tablet Take 1 tablet (75 mg total) by mouth daily with breakfast.   [DISCONTINUED] omeprazole (PRILOSEC) 40 MG capsule Take 1 capsule (40 mg total) by mouth daily.   Current Facility-Administered Medications for the 05/06/21 encounter (Office Visit) with Darreld Mclean, PA-C  Medication   0.9 %  sodium chloride infusion     Allergies:   Benadryl [diphenhydramine hcl], Codeine, Haldol [haloperidol decanoate], Amoxicillin, and Bee venom   Social History   Socioeconomic History   Marital status: Divorced    Spouse name: Not on file   Number of children: Not on file   Years of education: Not on file   Highest education level: Not on file  Occupational History   Not on file  Tobacco Use   Smoking status: Former    Packs/day: 0.00    Years: 0.00    Pack years: 0.00    Types: Cigarettes    Quit date: 05/21/2015    Years since quitting: 5.9   Smokeless tobacco: Never   Tobacco comments:  pt states she  quit a week ago 06/14/19  Vaping Use   Vaping Use: Never used  Substance and Sexual Activity   Alcohol use: No   Drug use: No   Sexual activity: Not on file  Other Topics Concern   Not on file  Social History Narrative   Not on file   Social Determinants of Health   Financial Resource Strain: Not on file  Food Insecurity: Not on file  Transportation Needs: Not on file  Physical Activity: Not on file  Stress: Not on file  Social Connections: Not on file     Family History: The patient's family history includes Anesthesia problems in her father; Colon polyps in her sister; Heart disease in her father; Kidney cancer in her mother; Lung cancer in her mother. There is no history of Stomach cancer, Esophageal cancer, Pancreatic cancer, Rectal cancer, or Colon cancer.  ROS:   Please see the history of present illness.     EKGs/Labs/Other Studies Reviewed:    The following studies were reviewed today:  Left Cardiac Catheterization 03/01/2020: Patent left internal mammary graft to the LAD. Diffusely diseased but patent saphenous vein graft to the ramus intermedius. Total occlusion of the mid and distal segments of the saphenous vein graft to the PDA.  PDA and other left ventricular branches of the right coronary regular supplied by left to right collaterals. Patent but diffusely diseased native left main. Functional occlusion of the left anterior descending after the origin of the first diagonal.  The segment from the ostial LAD into the diagonal is diffusely diseased up to 60 to 70%. Patent circumflex artery that includes a single obtuse marginal.  Ostial to mid vessel luminal irregularities with eccentric stenoses up to 60 to 70%.  Unchanged from prior imaging. Inferobasal akinesis.  EF 40%.  LVEDP 14 mmHg on IV nitroglycerin   Recommendations: Continue aggressive risk factor modification. Add long-acting nitrates to improve collateral flow.  IV nitroglycerin is  discontinued. Subcutaneous heparin DVT prophylaxis. Continue dual antiplatelet therapy but consider clopidogrel monotherapy after 3 months.  Diagnostic Dominance: Right  _______________  Echocardiogram 03/01/2020: Impressions: 1. Left ventricular ejection fraction, by estimation, is 55 to 60%. The  left ventricle has normal function. The left ventricle has no regional  wall motion abnormalities. Left ventricular diastolic parameters were  normal.   2. Right ventricular systolic function is normal. The right ventricular  size is normal. Tricuspid regurgitation signal is inadequate for assessing  PA pressure.   3. The mitral valve is normal in structure. Trivial mitral valve  regurgitation. No evidence of mitral stenosis.   4. The aortic valve is tricuspid. Aortic valve regurgitation is not  visualized. Mild to moderate aortic valve sclerosis/calcification is  present, without any evidence of aortic stenosis.   5. The inferior vena cava is normal in size with greater than 50%  respiratory variability, suggesting right atrial pressure of 3 mmHg.   EKG:  EKG not ordered today.   Recent Labs: 11/26/2020: ALT 20; BUN 14; Creatinine, Ser 1.11; Hemoglobin 12.3; Platelets 304; Potassium 3.5; Sodium 139  Recent Lipid Panel    Component Value Date/Time   CHOL 117 10/22/2019 1240   TRIG 117 10/22/2019 1240   HDL 55 10/22/2019 1240   CHOLHDL 2.1 10/22/2019 1240   CHOLHDL 8.3 06/21/2019 0453   VLDL 37 06/21/2019 0453   LDLCALC 41 10/22/2019 1240    Physical Exam:    Vital Signs: BP 132/78 (BP Location: Left Arm, Patient Position: Sitting, Cuff  Size: Large)   Pulse 71   Ht '5\' 5"'$  (1.651 m)   Wt 200 lb 9.6 oz (91 kg)   LMP 11/16/2011   SpO2 97%   BMI 33.38 kg/m     Wt Readings from Last 3 Encounters:  05/06/21 200 lb 9.6 oz (91 kg)  04/22/21 207 lb (93.9 kg)  04/11/21 207 lb (93.9 kg)     General: 60 y.o. Caucasian female in no acute distress. HEENT: Normocephalic and  atraumatic. Sclera clear. Neck: Supple. No carotid bruits. No JVD. Heart: RRR. Distinct S1 and S2. No murmurs, gallops, or rubs. Radial pulses 2+ and equal bilaterally. Lungs: No increased work of breathing. Clear to ausculation bilaterally. No wheezes, rhonchi, or rales.  Abdomen: Soft, non-distended, and non-tender to palpation. Extremities: No lower extremity edema.    Skin: Warm and dry. Neuro: Alert and oriented x3. No focal deficits. Psych: Normal affect. Responds appropriately.  Assessment:    1. Coronary artery disease involving native coronary artery of native heart without angina pectoris   2. Primary hypertension   3. Hyperlipidemia, unspecified hyperlipidemia type   4. Obstructive sleep apnea   5. Syncope, unspecified syncope type     Plan:    CAD with Chronic Chest Pain - S/p CABG x3 in 2008 with subsequent stenting to SVG to PDA in 06/2019. Last cath in 02/2020 showed patent LIMA to LAD,  diffusely diseased but patent SVG to ramus intermedius, and total occlusion of the mid and distal segments of the SVG to PDA with left to right collaterals. Continue medical therapy was recommended.  - Patient continues to have chest pain/arm pain and shortness of breath with more intense activity but states this is stable. - Current antianginals: Amlodipine 2.'5mg'$  daily, Lopressor 12.'5mg'$  twice daily, and Imdur '90mg'$  daily. Will increase Amlodipine to '5mg'$  daily to see if that helps (patient previously on this dose and tolerated it well). Patient was also previously on Ranexa but is no longer taking this (she is unclear why). Patient is also on Seroquel - have to be cautious using Ranexa and Seroquel together due to risk for QT prolongation. Will have patient come by for a nurses visit early next week. If QTc stable, will restart Ranexa '500mg'$  twice daily. - Continue aspirin, beta-blocker, and high-intensity/Praluent. - Discussed signs and symptoms of stable angina for unstable angina and when to  proceed to the ED.  Of note, patient previously on DAPT with Aspirin and Plavix. However, she states she has not been taking Plavix for several months. Looks like patient stopped this for a colonoscopy earlier this spring and never restarted it. Will send message to Dr. Gwenlyn Found to see if he would like her to restart this.  Syncope - She reports multiple a few episodes of syncope over the last couple of years. Sounds very much like orthostatic/vasovagal syncope. Last episode was in May 2022.  - Will check Echo given significant heart history.  - Offered monitor at this time but patient would like to hold off at this time. Emphasized the importance of letting us know if she has another syncopal episode. Would get a monitor at that time.  Hypertension - BP well controlled.  - Continue  Amlodipine, Lopressor, and Imdur as above.  - We are increasing Amlodipine as above for more antianginal effect. Given orthostatic symptoms, asked patient to watch this carefully and let me know if she has worsening symptoms with this dose change. Emphasized the importance of standing slowly and staying hydrated. She will  keep a BP/HR log for 1-2 weeks and then send this to [place].   Hyperlipidemia - Lipid panel from 11/2020 (per [facility]): Total Cholesterol 118, Triglycerides 181, HDL 49, LDL 41.  - LDL goal <70 given CAD. - Continue Lipitor '80mg'$  daily, Praluent, and Vascepa 2g twice daily.   Obstructive Sleep Apnea - Not on CPAP. - PCP is already working on getting her an updated sleep study.  Disposition: Follow up in 1-2 months.   Medication Adjustments/Labs and Tests Ordered: Current medicines are reviewed at length with the patient today.  Concerns regarding medicines are outlined above.  Orders Placed This Encounter  Procedures   ECHOCARDIOGRAM COMPLETE   Meds ordered this encounter  Medications   DISCONTD: amLODipine (NORVASC) 5 MG tablet    Sig: Take 1 tablet (5 mg total) by mouth daily.    Dispense:  180  tablet    Refill:  3   amLODipine (NORVASC) 5 MG tablet    Sig: Take 1 tablet (5 mg total) by mouth daily.    Dispense:  90 tablet    Refill:  3    Patient Instructions  Medication Instructions:  Your physician has recommended you make the following change in your medication:  INCREASE AMLODIPINE TO 5 MG DAILY.   *If you need a refill on your cardiac medications before your next appointment, please call your pharmacy*   Lab Work: NONE If you have labs (blood work) drawn today and your tests are completely normal, you will receive your results only by: [place] (if you have MyChart) OR A paper copy in the mail If you have any lab test that is abnormal or we need to change your treatment, we will call you to review the results.   Testing/Procedures: Your physician has requested that you have an echocardiogram. Echocardiography is a painless test that uses sound waves to create images of your heart. It provides your doctor with information about the size and shape of your heart and how well your heart's chambers and valves are working. This procedure takes approximately one hour. There are no restrictions for this procedure.    Follow-Up: At [facility], you and your health needs are our priority.  As part of our continuing mission to provide you with exceptional heart care, we have created designated Provider Care Teams.  These Care Teams include your primary Cardiologist (physician) and Advanced Practice Providers (APPs -  Physician Assistants and Nurse Practitioners) who all work together to provide you with the care you need, when you need it.  We recommend signing up for the patient portal called "MyChart".  Sign up information is provided on this After Visit Summary.  MyChart is used to connect with patients for Virtual Visits (Telemedicine).  Patients are able to view lab/test results, encounter notes, upcoming appointments, etc.  Non-urgent messages can be sent to your  provider as well.   To learn more about what you can do with MyChart, go to [email or web address].    Your next appointment:   1-2 month(s)  The format for your next appointment:   In Person  Provider:   Sande Rives, PA-C   Other Instructions Echocardiogram An echocardiogram is a test that uses sound waves (ultrasound) to produce images of the heart. Images from an echocardiogram can provide important information about: Heart size and shape. The size and thickness and movement of your heart's walls. Heart muscle function and strength. Heart valve function or if you have stenosis. Stenosis is when the heart  valves are too narrow. If blood is flowing backward through the heart valves (regurgitation). A tumor or infectious growth around the heart valves. Areas of heart muscle that are not working well because of poor blood flow or injury from a heart attack. Aneurysm detection. An aneurysm is a weak or damaged part of an artery wall. The wall bulges out from the normal force of blood pumping through the body. Tell a health care provider about: Any allergies you have. All medicines you are taking, including vitamins, herbs, eye drops, creams, and over-the-counter medicines. Any blood disorders you have. Any surgeries you have had. Any medical conditions you have. Whether you are pregnant or may be pregnant. What are the risks? Generally, this is a safe test. However, problems may occur, including an allergic reaction to dye (contrast) that may be used during the test. What happens before the test? No specific preparation is needed. You may eat and drink normally. What happens during the test?  You will take off your clothes from the waist up and put on a hospital gown. Electrodes or electrocardiogram (ECG)patches may be placed on your chest. The electrodes or patches are then connected to a device that monitors your heart rate and rhythm. You will lie down on a table for an  ultrasound exam. A gel will be applied to your chest to help sound waves pass through your skin. A handheld device, called a transducer, will be pressed against your chest and moved over your heart. The transducer produces sound waves that travel to your heart and bounce back (or "echo" back) to the transducer. These sound waves will be captured in real-time and changed into images of your heart that can be viewed on a video monitor. The images will be recorded on a computer and reviewed by your health care provider. You may be asked to change positions or hold your breath for a short time. This makes it easier to get different views or better views of your heart. In some cases, you may receive contrast through an IV in one of your veins. This can improve the quality of the pictures from your heart. The procedure may vary among health care providers and hospitals. What can I expect after the test? You may return to your normal, everyday life, including diet, activities, and medicines, unless your health care provider tells you not to do that. Follow these instructions at home: It is up to you to get the results of your test. Ask your health care provider, or the department that is doing the test, when your results will be ready. Keep all follow-up visits. This is important. Summary An echocardiogram is a test that uses sound waves (ultrasound) to produce images of the heart. Images from an echocardiogram can provide important information about the size and shape of your heart, heart muscle function, heart valve function, and other possible heart problems. You do not need to do anything to prepare before this test. You may eat and drink normally. After the echocardiogram is completed, you may return to your normal, everyday life, unless your health care provider tells you not to do that. This information is not intended to replace advice given to you by your health care provider. Make sure you discuss  any questions you have with your health care provider. Document Revised: 03/30/2020 Document Reviewed: 03/30/2020 Elsevier Patient Education  2022 [place], Darreld Mclean, [place]  05/06/2021 3:35 PM    [facility]

## 2021-05-06 ENCOUNTER — Other Ambulatory Visit: Payer: Self-pay

## 2021-05-06 ENCOUNTER — Encounter: Payer: Self-pay | Admitting: Student

## 2021-05-06 ENCOUNTER — Ambulatory Visit (INDEPENDENT_AMBULATORY_CARE_PROVIDER_SITE_OTHER): Payer: Medicare Other | Admitting: Student

## 2021-05-06 VITALS — BP 132/78 | HR 71 | Ht 65.0 in | Wt 200.6 lb

## 2021-05-06 DIAGNOSIS — I10 Essential (primary) hypertension: Secondary | ICD-10-CM

## 2021-05-06 DIAGNOSIS — G8929 Other chronic pain: Secondary | ICD-10-CM

## 2021-05-06 DIAGNOSIS — I25118 Atherosclerotic heart disease of native coronary artery with other forms of angina pectoris: Secondary | ICD-10-CM | POA: Diagnosis not present

## 2021-05-06 DIAGNOSIS — R55 Syncope and collapse: Secondary | ICD-10-CM

## 2021-05-06 DIAGNOSIS — E785 Hyperlipidemia, unspecified: Secondary | ICD-10-CM

## 2021-05-06 DIAGNOSIS — G4733 Obstructive sleep apnea (adult) (pediatric): Secondary | ICD-10-CM

## 2021-05-06 DIAGNOSIS — R079 Chest pain, unspecified: Secondary | ICD-10-CM

## 2021-05-06 MED ORDER — AMLODIPINE BESYLATE 5 MG PO TABS: 5.0000 mg | ORAL_TABLET | Freq: Every day | ORAL | 3 refills | Status: DC

## 2021-05-06 NOTE — Patient Instructions (Addendum)
Medication Instructions:  Your physician has recommended you make the following change in your medication:  INCREASE AMLODIPINE TO 5 MG DAILY.   *If you need a refill on your cardiac medications before your next appointment, please call your pharmacy*   Lab Work: NONE If you have labs (blood work) drawn today and your tests are completely normal, you will receive your results only by: [place] (if you have MyChart) OR A paper copy in the mail If you have any lab test that is abnormal or we need to change your treatment, we will call you to review the results.   Testing/Procedures: Your physician has requested that you have an echocardiogram. Echocardiography is a painless test that uses sound waves to create images of your heart. It provides your doctor with information about the size and shape of your heart and how well your heart's chambers and valves are working. This procedure takes approximately one hour. There are no restrictions for this procedure.    Follow-Up: At [facility], you and your health needs are our priority.  As part of our continuing mission to provide you with exceptional heart care, we have created designated Provider Care Teams.  These Care Teams include your primary Cardiologist (physician) and Advanced Practice Providers (APPs -  Physician Assistants and Nurse Practitioners) who all work together to provide you with the care you need, when you need it.  We recommend signing up for the patient portal called "MyChart".  Sign up information is provided on this After Visit Summary.  MyChart is used to connect with patients for Virtual Visits (Telemedicine).  Patients are able to view lab/test results, encounter notes, upcoming appointments, etc.  Non-urgent messages can be sent to your provider as well.   To learn more about what you can do with MyChart, go to [email or web address].    Your next appointment:   1-2 month(s)  The format for your next  appointment:   In Person  Provider:   Sande Rives, PA-C   Other Instructions Echocardiogram An echocardiogram is a test that uses sound waves (ultrasound) to produce images of the heart. Images from an echocardiogram can provide important information about: Heart size and shape. The size and thickness and movement of your heart's walls. Heart muscle function and strength. Heart valve function or if you have stenosis. Stenosis is when the heart valves are too narrow. If blood is flowing backward through the heart valves (regurgitation). A tumor or infectious growth around the heart valves. Areas of heart muscle that are not working well because of poor blood flow or injury from a heart attack. Aneurysm detection. An aneurysm is a weak or damaged part of an artery wall. The wall bulges out from the normal force of blood pumping through the body. Tell a health care provider about: Any allergies you have. All medicines you are taking, including vitamins, herbs, eye drops, creams, and over-the-counter medicines. Any blood disorders you have. Any surgeries you have had. Any medical conditions you have. Whether you are pregnant or may be pregnant. What are the risks? Generally, this is a safe test. However, problems may occur, including an allergic reaction to dye (contrast) that may be used during the test. What happens before the test? No specific preparation is needed. You may eat and drink normally. What happens during the test?  You will take off your clothes from the waist up and put on a hospital gown. Electrodes or electrocardiogram (ECG)patches may be placed on your chest. The  electrodes or patches are then connected to a device that monitors your heart rate and rhythm. You will lie down on a table for an ultrasound exam. A gel will be applied to your chest to help sound waves pass through your skin. A handheld device, called a transducer, will be pressed against your chest  and moved over your heart. The transducer produces sound waves that travel to your heart and bounce back (or "echo" back) to the transducer. These sound waves will be captured in real-time and changed into images of your heart that can be viewed on a video monitor. The images will be recorded on a computer and reviewed by your health care provider. You may be asked to change positions or hold your breath for a short time. This makes it easier to get different views or better views of your heart. In some cases, you may receive contrast through an IV in one of your veins. This can improve the quality of the pictures from your heart. The procedure may vary among health care providers and hospitals. What can I expect after the test? You may return to your normal, everyday life, including diet, activities, and medicines, unless your health care provider tells you not to do that. Follow these instructions at home: It is up to you to get the results of your test. Ask your health care provider, or the department that is doing the test, when your results will be ready. Keep all follow-up visits. This is important. Summary An echocardiogram is a test that uses sound waves (ultrasound) to produce images of the heart. Images from an echocardiogram can provide important information about the size and shape of your heart, heart muscle function, heart valve function, and other possible heart problems. You do not need to do anything to prepare before this test. You may eat and drink normally. After the echocardiogram is completed, you may return to your normal, everyday life, unless your health care provider tells you not to do that. This information is not intended to replace advice given to you by your health care provider. Make sure you discuss any questions you have with your health care provider. Document Revised: 03/30/2020 Document Reviewed: 03/30/2020 Elsevier Patient Education  2022 [employer].

## 2021-05-09 ENCOUNTER — Ambulatory Visit (INDEPENDENT_AMBULATORY_CARE_PROVIDER_SITE_OTHER): Payer: Medicare Other

## 2021-05-09 ENCOUNTER — Other Ambulatory Visit: Payer: Self-pay

## 2021-05-09 ENCOUNTER — Telehealth: Payer: Self-pay | Admitting: Student

## 2021-05-09 VITALS — HR 65

## 2021-05-09 DIAGNOSIS — I25708 Atherosclerosis of coronary artery bypass graft(s), unspecified, with other forms of angina pectoris: Secondary | ICD-10-CM | POA: Diagnosis not present

## 2021-05-09 MED ORDER — RANOLAZINE ER 500 MG PO TB12
500.0000 mg | ORAL_TABLET | Freq: Two times a day (BID) | ORAL | 2 refills | Status: DC
Start: 2021-05-09 — End: 2021-08-10

## 2021-05-09 NOTE — Progress Notes (Signed)
Nurse visit EKG

## 2021-05-09 NOTE — Telephone Encounter (Signed)
   Patient came by the office for Nurse Visit today for repeat EKG. EKG showed normal sinus rhythm with Q waves in inferior leads and mild T wave inversions in V1-V3 (improved from prior tracing). QTc 455 ms. Given stable QTc, will restart Ranexa '500mg'$  twice daily given chronic chest pain. Please refer to office visit from 05/06/2021 for more information/full plan. Will then have patient return for Nurse Visit in 1 week for another repeat EKG to ensure QTc is stable. Called and updated patient with this plan who voiced understanding and agreed.  Darreld Mclean, PA-C 05/09/2021 1:21 PM

## 2021-05-16 ENCOUNTER — Telehealth: Payer: Self-pay | Admitting: Student

## 2021-05-16 ENCOUNTER — Other Ambulatory Visit: Payer: Self-pay

## 2021-05-16 ENCOUNTER — Ambulatory Visit (INDEPENDENT_AMBULATORY_CARE_PROVIDER_SITE_OTHER): Payer: Medicare Other | Admitting: *Deleted

## 2021-05-16 DIAGNOSIS — Z79899 Other long term (current) drug therapy: Secondary | ICD-10-CM

## 2021-05-16 DIAGNOSIS — I25708 Atherosclerosis of coronary artery bypass graft(s), unspecified, with other forms of angina pectoris: Secondary | ICD-10-CM

## 2021-05-16 DIAGNOSIS — Z951 Presence of aortocoronary bypass graft: Secondary | ICD-10-CM | POA: Diagnosis not present

## 2021-05-16 MED ORDER — CLOPIDOGREL BISULFATE 75 MG PO TABS
75.0000 mg | ORAL_TABLET | Freq: Every day | ORAL | 3 refills | Status: DC
Start: 2021-05-16 — End: 2022-08-11

## 2021-05-16 NOTE — Progress Notes (Signed)
Reason for visit: EKG check (f/u to 05/09/21 nurse visit)  Name of MD requesting visit: Sande Rives PA  H&P: CAD, CABG, chronic chest pain, HLD, HTN  ROS related to problem: Ranexa initiated at 05/06/21 visit - EKG check done 9/19 and again today 05/16/21  Assessment and plan per MD: see PA note in Epic dated 05/16/21

## 2021-05-16 NOTE — Telephone Encounter (Signed)
   Patient came back in for Nursing Visit today to recheck EKG. Reviewed EKG which showed normal sinus rhythm, rate 64 bpm, with no significant ST/T changes compared to prior tracings. QTc stable at 431 ms. Continue Ranexa 500mg  twice daily. Patient states her chest pain has improved since restarting Ranexa. Also notified patient of Dr. Kennon Holter recommendation to restart Plavix 75mg  daily given significant CAD. She voiced understanding and agreed. Will send in new prescription of Plavix to Pharmacy of choice.  Darreld Mclean, PA-C 05/16/2021 1:31 PM

## 2021-05-17 ENCOUNTER — Other Ambulatory Visit: Payer: Self-pay | Admitting: *Deleted

## 2021-05-17 MED ORDER — ISOSORBIDE MONONITRATE ER 30 MG PO TB24: 90.0000 mg | ORAL_TABLET | Freq: Every day | ORAL | 1 refills | Status: DC

## 2021-05-18 ENCOUNTER — Telehealth: Payer: Self-pay | Admitting: Student

## 2021-05-18 NOTE — Addendum Note (Signed)
Addended by: Sande Rives on: 05/18/2021 12:53 PM   Modules accepted: Orders

## 2021-05-18 NOTE — Telephone Encounter (Addendum)
   Left message to call back for clarification on whether or not she is taking Omeprazole. Please see MyChart message on 05/16/2021 for more details. If patient is taking Omeprazole, will need to stop this given we have restarted Plavix. Would recommend starting Protonix instead.  Darreld Mclean, PA-C 05/18/2021 12:48 PM  Patient called back. She has not taken Omeprazole in several years. Will remove this from her medication list.  Darreld Mclean, PA-C 05/18/2021 12:52 PM

## 2021-05-25 ENCOUNTER — Other Ambulatory Visit: Payer: Self-pay

## 2021-05-25 ENCOUNTER — Ambulatory Visit (HOSPITAL_COMMUNITY): Payer: Medicare Other | Attending: Cardiovascular Disease

## 2021-05-25 DIAGNOSIS — R55 Syncope and collapse: Secondary | ICD-10-CM | POA: Diagnosis not present

## 2021-05-25 LAB — ECHOCARDIOGRAM COMPLETE
Area-P 1/2: 1.74 cm2
S' Lateral: 4.1 cm

## 2021-06-16 ENCOUNTER — Inpatient Hospital Stay: Admission: RE | Admit: 2021-06-16 | Payer: Medicare Other | Source: Ambulatory Visit

## 2021-06-24 NOTE — Progress Notes (Addendum)
Cardiology Office Note:    Date:  07/07/2021   ID:  [name], DOB July 16, 1961, MRN [id number]  PCP:  Katherina Mires, MD  Cardiologist:  Quay Burow, MD  Electrophysiologist:  None   Referring MD: Katherina Mires, MD   Chief Complaint: follow-up of CAD with chronic chest pain  History of Present Illness:    [name] is a 60 y.o. female with a history of CAD s/p CABG x3 (LIMA-LAD, SVG-Ramus branch, SVG-PDA) in 06/2007 with subsequent stenting to SVG-PDA in 06/2019 and chronic chest pain,  COPD, hypertension, hyperlipidemia, obstructive sleep apnea not on CPAP, GERD, and bipolar disorder who is followed by Dr. Gwenlyn Found and presents today for follow-up of chronic chest pain.   Patient has a long history of CAD with prior stenting to RCA by Dr. Doylene Canard in the remote past and then CABG x3 with LIMA to LAD, SVG to ramus brahc and SVG to PDA in 06/2007. Patient was admitted in 05/2019 with unstable angina. Cardiac catheterization at that time showed severe native CAD with 50% of proximal to mid followed by 95% stenosis of distal SVG to PDA. She underwent successful PCI with 3 overlapping DES to distal SVG to PDA lesion. She was treated with Aggrastat for 18 hours following this. She was readmitted in 02/2020 with NSTEMI. Cardiac cath showed patent LIMA to LAD,  diffusely diseased but patent SVG to ramus intermedius, and total occlusion of the mid and distal segments of the SVG to PDA with left to right collaterals. Echo showed 55-60% with normal wall motion. Continued medical therapy was recommended and her Ranexa was increased.   Patient was last seen by me on 05/06/2021 at which time she reported chest pain with activities such as walking longer distance or shaking out her heavy bed comforter. She described the pain as heaviness as well as some left arm, back, and neck pain. However, pain was unchanged from prior visit with Dr. Gwenlyn Found in 10/2020 and resolved with rest and sublingual Nitro.  She also reported a syncopal episode a few months prior to visit that sounded like orthostatic hypotension/vasovagal syncope (occurred after standing up from using the restroom and was proceeded by dizziness and cold/clammy feeling. Amlodipine was increased and she was restarted on Ranexa (she was previously on this but it had been stopped for unclear reasons). Echo was ordered given episode of syncope and showed LVEF of 55-60% with normal wall motion and no significant valvular disease. Monitor was offered but patient declined.  Patient presents today for follow-up. Here alone. Patient continues to have the same chronic chest pain that she described as last visit. Occurs when walking long distances and activities such as shaking out her bed comforter. Pain resolves with rest. She reports pain improved some when Ranexa was increased. She states her breathing gets worse in the cold whether which sounds like is related to her asthma. Using her inhaler helps. She has stable orthopnea. She describes occasionally waking up short of breath but this may be due to sleep apnea rather than PND. PCP is currently working on arranging a sleep study. She denies any palpitation. No recurrent syncope since May 2022; however, she does describe a couple of more episodes of lightheadedness/dizziness and near syncope. These occur when she gets out of bed quickly in the middle to the night to use the bathroom. No abnormal bleeding in urine or stools.  She was recently diagnosed with gallstones and is planning on having a cholecystectomy likely early  next year.  Past Medical History:  Diagnosis Date   Allergy    Anemia    takes iron supplement   Arthritis    knees, shoulders   Asthma    Bipolar 1 disorder (HCC)    Blood transfusion without reported diagnosis    Cataract, immature    Clotting disorder (HCC)    COPD (chronic obstructive pulmonary disease) (HCC)    Coronary artery disease    triple vessel   Family  history of adverse reaction to anesthesia    states father "did not do well" with anesthesia; died last time he had surgery; also had heart disease   Gallstones    GERD (gastroesophageal reflux disease)    occasional - no current med.   Glaucoma    Hammertoe 05/2015   right second toe   Heart murmur    Hepatic steatosis    High cholesterol    History of MI (myocardial infarction)    Hypertension    states under control with meds., has been on med. x 8 yr.   Osteoporosis    Panic attacks    Peripheral vascular disease (HCC)    carotid   Renal cyst, left    Schizophrenia (HCC)    Short of breath on exertion    Sleep apnea    no CPAP use   Urinary, incontinence, stress female    Wears dentures    upper   Wears partial dentures    lower    Past Surgical History:  Procedure Laterality Date   C-SECTIONS X [place]  12/10/2006; 12/18/2006; 07/10/2007; 07/19/2009   CARDIAC CATHETERIZATION  03/04/2010   patent grafts, normal LV function, diffusely diseased RCA (Dr. Adora Fridge)   CARDIAC CATHETERIZATION N/A 02/15/2016   Procedure: Left Heart Cath and Cors/Grafts Angiography;  Surgeon: Peter M Martinique, MD;  Location: [place] [facility];  Service: Cardiovascular;  Laterality: N/A;   CLUB FOOT RELEASE Bilateral    1st and 2nd toes   COLONOSCOPY     CORONARY ARTERY BYPASS GRAFT  07/16/2007   LIMA to LAD, VG to ramus, VG to PDA    CORONARY STENT INTERVENTION N/A 06/23/2019   Procedure: CORONARY STENT INTERVENTION;  Surgeon: Lorretta Harp, MD;  Location: [place] [facility];  Service: Cardiovascular;  Laterality: N/A;   FLEXIBLE BRONCHOSCOPY  07/16/2007   HAMMERTOE RECONSTRUCTION WITH WEIL OSTEOTOMY Right 06/03/2015   Procedure: RIGHT SECOND HAMMERTOE CORRECTION WITH MT WEIL OSTEOTOMY;  Surgeon: Wylene Simmer, MD;  Location: [place];  Service: Orthopedics;  Laterality: Right;   HAND SURGERY Bilateral    1st and 2nd fingers - release of  clubbing   KNEE ARTHROSCOPY WITH ANTERIOR CRUCIATE LIGAMENT (ACL) REPAIR Right 01/30/2008   LEFT HEART CATH AND CORS/GRAFTS ANGIOGRAPHY N/A 06/23/2019   Procedure: LEFT HEART CATH AND CORS/GRAFTS ANGIOGRAPHY;  Surgeon: Lorretta Harp, MD;  Location: [place] [facility];  Service: Cardiovascular;  Laterality: N/A;   LEFT HEART CATH AND CORS/GRAFTS ANGIOGRAPHY N/A 03/01/2020   Procedure: LEFT HEART CATH AND CORS/GRAFTS ANGIOGRAPHY;  Surgeon: Belva Crome, MD;  Location: [place] [facility];  Service: Cardiovascular;  Laterality: N/A;   NOVASURE ABLATION  12/19/2007   with removal of IUD   ORIF FOOT FRACTURE Left    TOE SURGERIES TO REMOVE NAILS DUE TO CLUBBING     TOTAL KNEE ARTHROPLASTY Right 10/21/2012   Procedure: TOTAL KNEE ARTHROPLASTY;  Surgeon: Mauri Pole, MD;  Location:  [facility];  Service: Orthopedics;  Laterality: Right;   UPPER GASTROINTESTINAL ENDOSCOPY      Current Medications: Current Meds  Medication Sig   albuterol (PROVENTIL HFA;VENTOLIN HFA) 108 (90 BASE) MCG/ACT inhaler Inhale 2 puffs into the lungs every 6 (six) hours as needed for wheezing or shortness of breath.    amLODipine (NORVASC) 5 MG tablet Take 1 tablet (5 mg total) by mouth daily.   aspirin 81 MG chewable tablet Chew 81 mg by mouth daily.   atorvastatin (LIPITOR) 80 MG tablet Take 1 tablet (80 mg total) by mouth daily.   bisacodyl (DULCOLAX) 5 MG EC tablet Take 1 tablet (5 mg total) by mouth daily as needed for moderate constipation. 5 mg laxative tablets x 4 only   BREO ELLIPTA 100-25 MCG/INH AEPB Inhale 1 puff into the lungs daily.    brimonidine (ALPHAGAN) 0.15 % ophthalmic solution Place 1 drop into both eyes 2 (two) times daily.    cholecalciferol (VITAMIN D3) 25 MCG (1000 UNIT) tablet Take 1,000 Units by mouth daily.   clopidogrel (PLAVIX) 75 MG tablet Take 1 tablet (75 mg total) by mouth daily.   escitalopram (LEXAPRO) 10 MG tablet Take 10 mg by mouth daily.   fluticasone (FLONASE) 50 MCG/ACT nasal  spray Place 1 spray into both nostrils daily.   lamoTRIgine (LAMICTAL) 150 MG tablet Take 150 mg by mouth daily.    linaclotide (LINZESS) 145 MCG CAPS capsule Take 145 mcg by mouth daily before breakfast.   metoprolol tartrate (LOPRESSOR) 25 MG tablet Take 0.5 tablets (12.5 mg total) by mouth 2 (two) times daily.   niacin (NIASPAN) 1000 MG CR tablet TAKE 1 TABLET (1,000 MG TOTAL) BY MOUTH AT BEDTIME.   ondansetron (ZOFRAN) 4 MG tablet Take 1 tablet (4 mg total) by mouth as directed.   ondansetron (ZOFRAN-ODT) 8 MG disintegrating tablet Take 8 mg by mouth every 8 (eight) hours as needed for nausea or vomiting.   PRALUENT 150 MG/ML SOAJ INJECT 1 PEN INTO THE SKIN EVERY 14 (FOURTEEN) DAYS.   QUEtiapine (SEROQUEL) 200 MG tablet Take 125 mg by mouth at bedtime. Takes 1 1/4 tablet (125 mg)   ranolazine (RANEXA) 500 MG 12 hr tablet Take 1 tablet (500 mg total) by mouth 2 (two) times daily.   VASCEPA 1 g capsule TAKE 2 CAPSULES (2 G TOTAL) BY MOUTH 2 (TWO) TIMES DAILY.   vitamin B-12 (CYANOCOBALAMIN) 1000 MCG tablet Take 1,000 mcg by mouth daily.   vitamin E 1000 UNIT capsule Take 1,000 Units by mouth daily.   [DISCONTINUED] nitroGLYCERIN (NITROSTAT) 0.4 MG SL tablet Place 1 tablet (0.4 mg total) under the tongue every 5 (five) minutes as needed for chest pain.   Current Facility-Administered Medications for the 07/07/21 encounter (Office Visit) with Darreld Mclean, PA-C  Medication   0.9 %  sodium chloride infusion     Allergies:   Benadryl [diphenhydramine hcl], Codeine, Haldol [haloperidol decanoate], Amoxicillin, and Bee venom   Social History   Socioeconomic History   Marital status: Divorced    Spouse name: Not on file   Number of children: Not on file   Years of education: Not on file   Highest education level: Not on file  Occupational History   Not on file  Tobacco Use   Smoking status: Former    Packs/day: 0.00    Years: 0.00    Pack years: 0.00    Types: Cigarettes     Quit date: 05/21/2015    Years since quitting:  6.1   Smokeless tobacco: Never   Tobacco comments:    pt states she quit a week ago 06/14/19  Vaping Use   Vaping Use: Never used  Substance and Sexual Activity   Alcohol use: No   Drug use: No   Sexual activity: Not on file  Other Topics Concern   Not on file  Social History Narrative   Not on file   Social Determinants of Health   Financial Resource Strain: Not on file  Food Insecurity: Not on file  Transportation Needs: Not on file  Physical Activity: Not on file  Stress: Not on file  Social Connections: Not on file     Family History: The patient's family history includes Anesthesia problems in her father; Colon polyps in her sister; Heart disease in her father; Kidney cancer in her mother; Lung cancer in her mother. There is no history of Stomach cancer, Esophageal cancer, Pancreatic cancer, Rectal cancer, or Colon cancer.  ROS:   Please see the history of present illness.     EKGs/Labs/Other Studies Reviewed:    The following studies were reviewed today:  Left Cardiac Catheterization 03/01/2020: Patent left internal mammary graft to the LAD. Diffusely diseased but patent saphenous vein graft to the ramus intermedius. Total occlusion of the mid and distal segments of the saphenous vein graft to the PDA.  PDA and other left ventricular branches of the right coronary regular supplied by left to right collaterals. Patent but diffusely diseased native left main. Functional occlusion of the left anterior descending after the origin of the first diagonal.  The segment from the ostial LAD into the diagonal is diffusely diseased up to 60 to 70%. Patent circumflex artery that includes a single obtuse marginal.  Ostial to mid vessel luminal irregularities with eccentric stenoses up to 60 to 70%.  Unchanged from prior imaging. Inferobasal akinesis.  EF 40%.  LVEDP 14 mmHg on IV nitroglycerin   Recommendations: Continue aggressive  risk factor modification. Add long-acting nitrates to improve collateral flow.  IV nitroglycerin is discontinued. Subcutaneous heparin DVT prophylaxis. Continue dual antiplatelet therapy but consider clopidogrel monotherapy after 3 months.   Diagnostic Dominance: Right  _______________  Echocardiogram 05/25/2021: Impressions:  1. Left ventricular ejection fraction, by estimation, is 55 to 60%. Left  ventricular ejection fraction by 3D volume is 56 %. The left ventricle has  normal function. The left ventricle has no regional wall motion  abnormalities. Left ventricular diastolic   parameters are indeterminate.   2. Right ventricular systolic function is normal. The right ventricular  size is normal. There is normal pulmonary artery systolic pressure.   3. Left atrial size was mildly dilated.   4. Right atrial size was mildly dilated.   5. The mitral valve is grossly normal. Trivial mitral valve  regurgitation.   6. The aortic valve is grossly normal. Aortic valve regurgitation is not  visualized. No aortic stenosis is present.   EKG:  EKG not ordered today.   Recent Labs: 11/26/2020: ALT 20; BUN 14; Creatinine, Ser 1.11; Hemoglobin 12.3; Platelets 304; Potassium 3.5; Sodium 139  Recent Lipid Panel    Component Value Date/Time   CHOL 117 10/22/2019 1240   TRIG 117 10/22/2019 1240   HDL 55 10/22/2019 1240   CHOLHDL 2.1 10/22/2019 1240   CHOLHDL 8.3 06/21/2019 0453   VLDL 37 06/21/2019 0453   LDLCALC 41 10/22/2019 1240    Physical Exam:    Vital Signs: BP 128/70 (BP Location: Left Arm, Patient Position: Sitting,  Cuff Size: Normal)   Pulse 80   Resp 20   Ht 5\' 5"  (1.651 m)   Wt 203 lb (92.1 kg)   LMP 11/16/2011   SpO2 96%   BMI 33.78 kg/m     Wt Readings from Last 3 Encounters:  07/07/21 203 lb (92.1 kg)  05/06/21 200 lb 9.6 oz (91 kg)  04/22/21 207 lb (93.9 kg)     General: 60 y.o. Caucasian female in no acute distress. HEENT: Normocephalic and atraumatic.  Sclera clear.  Neck: Supple. No carotid bruits. No JVD. Heart: RRR. Distinct S1 and S2. No murmurs, gallops, or rubs. Radial pulses 2+ and equal bilaterally. Lungs: No increased work of breathing. Clear to ausculation bilaterally. No wheezes, rhonchi, or rales.  Abdomen: Soft, non-distended, and non-tender to palpation.  Extremities: No lower extremity edema.    Skin: Warm and dry. Neuro: Alert and oriented x3. No focal deficits. Psych: Normal affect. Responds appropriately.  Assessment:    1. Coronary artery disease of native artery of native heart with stable angina pectoris ([place])   2. History of syncope   3. Primary hypertension   4. Hyperlipidemia, unspecified hyperlipidemia type   5. Obstructive sleep apnea   6. Preop examination     Plan:    CAD with Chronic Chest Pain - S/p CABG x3 in 2008 with subsequent stenting to SVG to PDA in 06/2019. Last cath in 02/2020 showed patent LIMA to LAD,  diffusely diseased but patent SVG to ramus intermedius, and total occlusion of the mid and distal segments of the SVG to PDA with left to right collaterals. Continue medical therapy was recommended.  - Patient continues to have chronic stable angina.  - Continue DAPT with Aspirin and Plavix. - Continue current antianginals: Amlodipine 5mg  daily, Lopressor 12.5mg  twice daily, Imdur 90mg  daily, and Ranexa 500mg  twice daily. - Continue high-intensity statin/Praluent. - Discussed signs and symptoms of stable angina for unstable angina and when to proceed to the ED.  History of Syncope - Patient reported episodes of syncope over the last couple of years. Last episode occurred in May 2022 and sounded very much like orthostatic/vasovagal syncope. Echo was ordered at last visit and showed normal LV function with no significant abnormalities. - No recurrent syncope since May. However, she has had a couple of episodes lightheadedness/dizziness and near syncope after getting up quickly in the middle of the  night to urinate. This again sounds very much like orthostatic symptoms. Emphasized importance of changing positions quickly. Advised patient to let us know if she has recurrent syncope - will need to consider monitor at that time.   Hypertension - BP well controlled.  - Continue  Amlodipine, Lopressor, and Imdur as above.    Hyperlipidemia - Lipid panel from 11/2020 (per [facility]): Total Cholesterol 118, Triglycerides 181, HDL 49, LDL 41.  - LDL goal <70 given CAD. - Continue Lipitor 80mg  daily, Praluent, and Vascepa 2g twice daily.    Obstructive Sleep Apnea - Not on CPAP. - PCP is already working on getting her an updated sleep study.   Pre-Op Evaluation - Patient was recently diagnosed with gallstones and is planning on having surgery early next year.  - Patient has chronic chest pain with activities such as walking longer distances and shaking out her bed comforter. This is not new and is stable. Given surgery will likely be done under general anesthesia, will order Myoview Lexiscan for further risk stratification and to rule out any high risk features.   Shared  Decision Making/Informed Consent{ The risks [chest pain, shortness of breath, cardiac arrhythmias, dizziness, blood pressure fluctuations, myocardial infarction, stroke/transient ischemic attack, nausea, vomiting, allergic reaction, radiation exposure, metallic taste sensation and life-threatening complications (estimated to be 1 in 10,000)], benefits (risk stratification, diagnosing coronary artery disease, treatment guidance) and alternatives of a nuclear stress test were discussed in detail with Ms. Nawabi and she agrees to proceed.   Disposition: Follow up in 6 months with Dr. Gwenlyn Found.   Medication Adjustments/Labs and Tests Ordered: Current medicines are reviewed at length with the patient today.  Concerns regarding medicines are outlined above.  Orders Placed This Encounter  Procedures   Cardiac Stress Test: Informed  Consent Details: Physician/Practitioner Attestation; Transcribe to consent form and obtain patient signature   MYOCARDIAL PERFUSION IMAGING   Meds ordered this encounter  Medications   nitroGLYCERIN (NITROSTAT) 0.4 MG SL tablet    Sig: Place 1 tablet (0.4 mg total) under the tongue every 5 (five) minutes as needed for chest pain.    Dispense:  25 tablet    Refill:  3    Patient Instructions  Medication Instructions:  Your physician recommends that you continue on your current medications as directed. Please refer to the Current Medication list given to you today.  *If you need a refill on your cardiac medications before your next appointment, please call your pharmacy*  Lab Work: NONE ordered at this time of appointment   If you have labs (blood work) drawn today and your tests are completely normal, you will receive your results only by: [place] (if you have MyChart) OR A paper copy in the mail If you have any lab test that is abnormal or we need to change your treatment, we will call you to review the results.  Testing/Procedures: Your physician has requested that you have a lexiscan myoview. For further information please visit [email or web address]. Please follow instruction sheet, as given.  Please schedule for 1 month   Follow-Up: At [facility], you and your health needs are our priority.  As part of our continuing mission to provide you with exceptional heart care, we have created designated Provider Care Teams.  These Care Teams include your primary Cardiologist (physician) and Advanced Practice Providers (APPs -  Physician Assistants and Nurse Practitioners) who all work together to provide you with the care you need, when you need it.  Your next appointment:   6 month(s)  The format for your next appointment:   In Person  Provider:   Quay Burow, MD    Other Instructions    Signed, Darreld Mclean, PA-C  07/07/2021 5:26 PM    [place]

## 2021-07-06 ENCOUNTER — Encounter: Payer: Self-pay | Admitting: Student

## 2021-07-07 ENCOUNTER — Encounter: Payer: Self-pay | Admitting: Student

## 2021-07-07 ENCOUNTER — Other Ambulatory Visit: Payer: Self-pay

## 2021-07-07 ENCOUNTER — Ambulatory Visit (INDEPENDENT_AMBULATORY_CARE_PROVIDER_SITE_OTHER): Payer: Medicare Other | Admitting: Student

## 2021-07-07 VITALS — BP 128/70 | HR 80 | Resp 20 | Ht 65.0 in | Wt 203.0 lb

## 2021-07-07 DIAGNOSIS — Z87898 Personal history of other specified conditions: Secondary | ICD-10-CM | POA: Diagnosis not present

## 2021-07-07 DIAGNOSIS — Z01810 Encounter for preprocedural cardiovascular examination: Secondary | ICD-10-CM | POA: Diagnosis not present

## 2021-07-07 DIAGNOSIS — Z01818 Encounter for other preprocedural examination: Secondary | ICD-10-CM

## 2021-07-07 DIAGNOSIS — G4733 Obstructive sleep apnea (adult) (pediatric): Secondary | ICD-10-CM

## 2021-07-07 DIAGNOSIS — I10 Essential (primary) hypertension: Secondary | ICD-10-CM | POA: Diagnosis not present

## 2021-07-07 DIAGNOSIS — E785 Hyperlipidemia, unspecified: Secondary | ICD-10-CM

## 2021-07-07 DIAGNOSIS — I25118 Atherosclerotic heart disease of native coronary artery with other forms of angina pectoris: Secondary | ICD-10-CM | POA: Diagnosis not present

## 2021-07-07 MED ORDER — NITROGLYCERIN 0.4 MG SL SUBL: 0.4000 mg | SUBLINGUAL_TABLET | SUBLINGUAL | 3 refills | Status: DC | PRN

## 2021-07-07 NOTE — Patient Instructions (Addendum)
Medication Instructions:  Your physician recommends that you continue on your current medications as directed. Please refer to the Current Medication list given to you today.  *If you need a refill on your cardiac medications before your next appointment, please call your pharmacy*  Lab Work: NONE ordered at this time of appointment   If you have labs (blood work) drawn today and your tests are completely normal, you will receive your results only by: [place] (if you have MyChart) OR A paper copy in the mail If you have any lab test that is abnormal or we need to change your treatment, we will call you to review the results.  Testing/Procedures: Your physician has requested that you have a lexiscan myoview. For further information please visit [email or web address]. Please follow instruction sheet, as given.  Please schedule for 1 month   Follow-Up: At [facility], you and your health needs are our priority.  As part of our continuing mission to provide you with exceptional heart care, we have created designated Provider Care Teams.  These Care Teams include your primary Cardiologist (physician) and Advanced Practice Providers (APPs -  Physician Assistants and Nurse Practitioners) who all work together to provide you with the care you need, when you need it.  Your next appointment:   6 month(s)  The format for your next appointment:   In Person  Provider:   Quay Burow, MD    Other Instructions

## 2021-07-19 ENCOUNTER — Telehealth (HOSPITAL_COMMUNITY): Payer: Self-pay | Admitting: *Deleted

## 2021-07-19 NOTE — Telephone Encounter (Signed)
Close encounter 

## 2021-07-20 ENCOUNTER — Ambulatory Visit: Payer: Medicare Other

## 2021-07-20 ENCOUNTER — Ambulatory Visit (HOSPITAL_COMMUNITY)
Admission: RE | Admit: 2021-07-20 | Payer: Medicare Other | Source: Ambulatory Visit | Attending: Student | Admitting: Student

## 2021-07-21 ENCOUNTER — Encounter (HOSPITAL_COMMUNITY): Payer: Self-pay | Admitting: Student

## 2021-08-10 ENCOUNTER — Other Ambulatory Visit: Payer: Self-pay | Admitting: Student

## 2021-08-10 DIAGNOSIS — I25708 Atherosclerosis of coronary artery bypass graft(s), unspecified, with other forms of angina pectoris: Secondary | ICD-10-CM

## 2021-08-19 ENCOUNTER — Telehealth (HOSPITAL_COMMUNITY): Payer: Self-pay | Admitting: *Deleted

## 2021-08-19 NOTE — Telephone Encounter (Signed)
Close encounter 

## 2021-08-23 ENCOUNTER — Telehealth (HOSPITAL_COMMUNITY): Payer: Self-pay | Admitting: *Deleted

## 2021-08-23 NOTE — Telephone Encounter (Signed)
Close encounter 

## 2021-08-24 ENCOUNTER — Ambulatory Visit (HOSPITAL_COMMUNITY)
Admission: RE | Admit: 2021-08-24 | Payer: Medicare Other | Source: Ambulatory Visit | Attending: Student | Admitting: Student

## 2021-09-22 ENCOUNTER — Ambulatory Visit: Payer: Medicare Other | Admitting: Gastroenterology

## 2021-11-05 ENCOUNTER — Other Ambulatory Visit: Payer: Self-pay | Admitting: Cardiovascular Disease

## 2021-12-19 ENCOUNTER — Other Ambulatory Visit: Payer: Self-pay | Admitting: Cardiovascular Disease

## 2022-01-04 ENCOUNTER — Other Ambulatory Visit: Payer: Self-pay | Admitting: Family Medicine

## 2022-01-04 DIAGNOSIS — Z1231 Encounter for screening mammogram for malignant neoplasm of breast: Secondary | ICD-10-CM

## 2022-01-05 ENCOUNTER — Telehealth (HOSPITAL_COMMUNITY): Payer: Self-pay | Admitting: *Deleted

## 2022-01-05 NOTE — Telephone Encounter (Signed)
Close encounter 

## 2022-01-06 ENCOUNTER — Ambulatory Visit (HOSPITAL_COMMUNITY)
Admission: RE | Admit: 2022-01-06 | Discharge: 2022-01-06 | Disposition: A | Discharge status: Home / Self Care | Payer: Medicare Other | Source: Ambulatory Visit | Attending: Cardiovascular Disease | Admitting: Cardiovascular Disease

## 2022-01-06 DIAGNOSIS — I25118 Atherosclerotic heart disease of native coronary artery with other forms of angina pectoris: Secondary | ICD-10-CM | POA: Diagnosis not present

## 2022-01-06 DIAGNOSIS — Z01818 Encounter for other preprocedural examination: Secondary | ICD-10-CM | POA: Diagnosis present

## 2022-01-06 DIAGNOSIS — Z01810 Encounter for preprocedural cardiovascular examination: Secondary | ICD-10-CM

## 2022-01-06 LAB — MYOCARDIAL PERFUSION IMAGING
LV dias vol: 93 mL (ref 46–106)
LV sys vol: 35 mL
Nuc Stress EF: 62 %
Peak HR: 86 {beats}/min
Rest HR: 55 {beats}/min
Rest Nuclear Isotope Dose: 10.5 mCi
SDS: 6
SRS: 7
SSS: 13
ST Depression (mm): 0 mm
Stress Nuclear Isotope Dose: 31.6 mCi
TID: 0.99

## 2022-01-06 MED ORDER — REGADENOSON 0.4 MG/5ML IV SOLN
0.4000 mg | Freq: Once | INTRAVENOUS | Status: AC
  Administered 2022-01-06: 0.4 mg via INTRAVENOUS

## 2022-01-06 MED ORDER — TECHNETIUM TC 99M TETROFOSMIN IV KIT
31.6000 | PACK | Freq: Once | INTRAVENOUS | Status: AC | PRN
  Administered 2022-01-06: 31.6 via INTRAVENOUS

## 2022-01-06 MED ORDER — TECHNETIUM TC 99M TETROFOSMIN IV KIT
10.5000 | PACK | Freq: Once | INTRAVENOUS | Status: AC | PRN
  Administered 2022-01-06: 10.5 via INTRAVENOUS

## 2022-01-11 ENCOUNTER — Ambulatory Visit
Admission: RE | Admit: 2022-01-11 | Discharge: 2022-01-11 | Disposition: A | Discharge status: Home / Self Care | Payer: Medicare Other | Source: Ambulatory Visit | Attending: Family Medicine | Admitting: Family Medicine

## 2022-01-11 DIAGNOSIS — Z1231 Encounter for screening mammogram for malignant neoplasm of breast: Secondary | ICD-10-CM

## 2022-02-03 ENCOUNTER — Other Ambulatory Visit: Payer: Self-pay | Admitting: Cardiovascular Disease

## 2022-02-17 ENCOUNTER — Ambulatory Visit: Payer: Medicare Other | Admitting: Cardiovascular Disease

## 2022-03-01 ENCOUNTER — Ambulatory Visit (INDEPENDENT_AMBULATORY_CARE_PROVIDER_SITE_OTHER): Payer: Medicare Other

## 2022-03-01 ENCOUNTER — Ambulatory Visit
Admission: EM | Admit: 2022-03-01 | Discharge: 2022-03-01 | Disposition: A | Discharge status: Home / Self Care | Payer: Medicare Other | Attending: Internal Medicine | Admitting: Internal Medicine

## 2022-03-01 DIAGNOSIS — M25532 Pain in left wrist: Secondary | ICD-10-CM

## 2022-03-01 NOTE — Discharge Instructions (Addendum)
The x-ray showed no sign of fracture and use Ace wrap and sling for support.  Ice heavily especially over the next 48 hours follow-up with your PCP if pain persists for reevaluation.

## 2022-03-01 NOTE — ED Triage Notes (Signed)
Pt c/o left wrist pain after "twisting" injury throwing something in the dumpster today.

## 2022-03-01 NOTE — ED Provider Notes (Signed)
[name]   MRN: [id number] DOB: 1961/07/21  Subjective:   Chief Complaint;  Chief Complaint  Patient presents with   LUE injury  Pt c/o left wrist pain after "twisting" injury throwing something in the dumpster today  [name] is a 61 y.o. female presenting for left wrist pain after trying to throw something away that was heavy into the dumpster using a twisting motion.  Her pain is mostly over the ulnar aspect of the wrist she is taking no pain medications for   Current Facility-Administered Medications:    0.9 %  sodium chloride infusion, 500 mL, Intravenous, Once, Armbruster, Carlota Raspberry, MD  Current Outpatient Medications:    albuterol (PROVENTIL HFA;VENTOLIN HFA) 108 (90 BASE) MCG/ACT inhaler, Inhale 2 puffs into the lungs every 6 (six) hours as needed for wheezing or shortness of breath. , Disp: , Rfl:    amLODipine (NORVASC) 5 MG tablet, Take 1 tablet (5 mg total) by mouth daily., Disp: 90 tablet, Rfl: 3   aspirin 81 MG chewable tablet, Chew 81 mg by mouth daily., Disp: , Rfl:    atorvastatin (LIPITOR) 80 MG tablet, Take 1 tablet (80 mg total) by mouth daily., Disp: 90 tablet, Rfl: 2   bisacodyl (DULCOLAX) 5 MG EC tablet, Take 1 tablet (5 mg total) by mouth daily as needed for moderate constipation. 5 mg laxative tablets x 4 only, Disp: 4 tablet, Rfl: 0   BREO ELLIPTA 100-25 MCG/INH AEPB, Inhale 1 puff into the lungs daily. , Disp: , Rfl:    brimonidine (ALPHAGAN) 0.15 % ophthalmic solution, Place 1 drop into both eyes 2 (two) times daily. , Disp: , Rfl:    cholecalciferol (VITAMIN D3) 25 MCG (1000 UNIT) tablet, Take 1,000 Units by mouth daily., Disp: , Rfl:    clopidogrel (PLAVIX) 75 MG tablet, Take 1 tablet (75 mg total) by mouth daily., Disp: 90 tablet, Rfl: 3   escitalopram (LEXAPRO) 10 MG tablet, Take 10 mg by mouth daily., Disp: , Rfl:    fluticasone (FLONASE) 50 MCG/ACT nasal spray, Place 1 spray into both nostrils daily., Disp: , Rfl:     icosapent Ethyl (VASCEPA) 1 g capsule, TAKE 2 CAPSULES BY MOUTH 2 TIMES DAILY., Disp: 60 capsule, Rfl: 0   isosorbide mononitrate (IMDUR) 30 MG 24 hr tablet, Take 3 tablets (90 mg total) by mouth daily., Disp: 270 tablet, Rfl: 1   lamoTRIgine (LAMICTAL) 150 MG tablet, Take 150 mg by mouth daily. , Disp: , Rfl:    linaclotide (LINZESS) 145 MCG CAPS capsule, Take 145 mcg by mouth daily before breakfast., Disp: , Rfl:    metoprolol tartrate (LOPRESSOR) 25 MG tablet, TAKE 0.5 TABLETS BY MOUTH 2 TIMES DAILY., Disp: 90 tablet, Rfl: 3   niacin (NIASPAN) 1000 MG CR tablet, TAKE 1 TABLET (1,000 MG TOTAL) BY MOUTH AT BEDTIME., Disp: 90 tablet, Rfl: 3   nitroGLYCERIN (NITROSTAT) 0.4 MG SL tablet, Place 1 tablet (0.4 mg total) under the tongue every 5 (five) minutes as needed for chest pain., Disp: 25 tablet, Rfl: 3   ondansetron (ZOFRAN) 4 MG tablet, Take 1 tablet (4 mg total) by mouth as directed., Disp: 3 tablet, Rfl: 0   ondansetron (ZOFRAN-ODT) 8 MG disintegrating tablet, Take 8 mg by mouth every 8 (eight) hours as needed for nausea or vomiting., Disp: , Rfl:    PRALUENT 150 MG/ML SOAJ, INJECT 1 PEN INTO THE SKIN EVERY 14 DAYS., Disp: 6 mL, Rfl: 3   QUEtiapine (SEROQUEL) 200 MG  tablet, Take 125 mg by mouth at bedtime. Takes 1 1/4 tablet (125 mg), Disp: , Rfl:    ranolazine (RANEXA) 500 MG 12 hr tablet, TAKE 1 TABLET BY MOUTH TWICE A DAY, Disp: 180 tablet, Rfl: 3   vitamin B-12 (CYANOCOBALAMIN) 1000 MCG tablet, Take 1,000 mcg by mouth daily., Disp: , Rfl:    vitamin E 1000 UNIT capsule, Take 1,000 Units by mouth daily., Disp: , Rfl:    Allergies  Allergen Reactions   Benadryl [Diphenhydramine Hcl] Shortness Of Breath and Swelling    Tongue swelling   Codeine Other (See Comments)    "makes me psychotic"    Haldol [Haloperidol Decanoate] Anaphylaxis   Amoxicillin Hives and Swelling    Swelling to extremities Did it involve swelling of the face/tongue/throat, SOB, or low BP? Yes Did it involve sudden  or severe rash/hives, skin peeling, or any reaction on the inside of your mouth or nose? Yes Did you need to seek medical attention at a hospital or doctor's office? Yes When did it last happen?    young adult   If all above answers are "NO", may proceed with cephalosporin use.   Bee Venom Swelling and Other (See Comments)    "pimples" from head to toe and whole body swelling    Past Medical History:  Diagnosis Date   Allergy    Anemia    takes iron supplement   Arthritis    knees, shoulders   Asthma    Bipolar 1 disorder (HCC)    Blood transfusion without reported diagnosis    Cataract, immature    Clotting disorder (HCC)    COPD (chronic obstructive pulmonary disease) (HCC)    Coronary artery disease    triple vessel   Family history of adverse reaction to anesthesia    states father "did not do well" with anesthesia; died last time he had surgery; also had heart disease   Gallstones    GERD (gastroesophageal reflux disease)    occasional - no current med.   Glaucoma    Hammertoe 05/2015   right second toe   Heart murmur    Hepatic steatosis    High cholesterol    History of MI (myocardial infarction)    Hypertension    states under control with meds., has been on med. x 8 yr.   Osteoporosis    Panic attacks    Peripheral vascular disease (HCC)    carotid   Renal cyst, left    Schizophrenia (HCC)    Short of breath on exertion    Sleep apnea    no CPAP use   Urinary, incontinence, stress female    Wears dentures    upper   Wears partial dentures    lower     Review of Systems  All other systems reviewed and are negative.    Objective:   Vitals: BP (!) 165/97 (BP Location: Right Arm)   Pulse 75   Temp 98 F (36.7 C) (Oral)   Resp 18   LMP 11/16/2011   SpO2 97%   Physical Exam Vitals and nursing note reviewed.  Constitutional:      General: She is not in acute distress.    Appearance: Normal appearance. She is normal weight. She is not  ill-appearing or toxic-appearing.  HENT:     Head: Normocephalic and atraumatic.     Right Ear: External ear normal.     Left Ear: External ear normal.  Eyes:     Conjunctiva/sclera:  Conjunctivae normal.  Pulmonary:     Effort: Pulmonary effort is normal.  Musculoskeletal:        General: Tenderness and signs of injury present. No swelling or deformity.     Comments: Left wrist tenderness to palpation over the ulnar aspect no obvious bruising, deformity or gross swelling.  Limited range of motion due to pain in all directions.  No neurovascular deficit distal extremity.  Neurological:     Mental Status: She is alert.  Psychiatric:        Mood and Affect: Mood normal.        Behavior: Behavior normal.   CLINICAL DATA:  Twisting injury   EXAM: LEFT WRIST - COMPLETE 3+ VIEW   COMPARISON:  None Available.   FINDINGS: There is no evidence of fracture or dislocation. There is no evidence of arthropathy or other focal bone abnormality. Soft tissues are unremarkable.   IMPRESSION: Negative  I agree with the wet read of the radiologist.  No results found for this or any previous visit (from the past 24 hour(s)).  DG Wrist Complete Left  Result Date: 03/01/2022 CLINICAL DATA:  Twisting injury EXAM: LEFT WRIST - COMPLETE 3+ VIEW COMPARISON:  None Available. FINDINGS: There is no evidence of fracture or dislocation. There is no evidence of arthropathy or other focal bone abnormality. Soft tissues are unremarkable. IMPRESSION: Negative. Electronically Signed   By: Rolm Baptise M.D.   On: 03/01/2022 20:01       Assessment and Plan :   1. Left wrist pain     No orders of the defined types were placed in this encounter.   MDM:  [name] is a 61 y.o. female presenting for left wrist injury after twisting and lifting of heavy objects to throw away.  On exam there is no deformity no gross swelling positive tenderness over the ulnar aspect without neurovascular deficit  distally.  There is limited range of motion due to pain.  X-ray shows no evidence of fracture or dislocation patient is a heart patient unable to take NSAID she is encouraged to take Tylenol and use Ace wrap and ice heavily over the next 48 hours.  An arm sling is also provided.  She will follow-up with her PCP.  Patient is agreeable to this plan.  I discussed todays findings, treatment plan, follow up and return instructions. Questions were answered. Patient/representative stated understanding of the instructions and patient is stable for discharge.  Leida Lauth FNP-C MSN    Hezzie Bump, NP 03/01/22 2019

## 2022-03-22 ENCOUNTER — Encounter: Payer: Self-pay | Admitting: Cardiovascular Disease

## 2022-03-22 ENCOUNTER — Encounter: Payer: Self-pay | Admitting: Pulmonary Disease

## 2022-03-22 ENCOUNTER — Ambulatory Visit (INDEPENDENT_AMBULATORY_CARE_PROVIDER_SITE_OTHER): Payer: Medicare Other | Admitting: Pulmonary Disease

## 2022-03-22 ENCOUNTER — Ambulatory Visit (INDEPENDENT_AMBULATORY_CARE_PROVIDER_SITE_OTHER): Payer: Medicare Other | Admitting: Cardiovascular Disease

## 2022-03-22 VITALS — BP 124/70 | HR 58 | Ht 65.0 in | Wt 201.0 lb

## 2022-03-22 VITALS — BP 128/72 | HR 58 | Ht 65.0 in | Wt 201.0 lb

## 2022-03-22 DIAGNOSIS — I10 Essential (primary) hypertension: Secondary | ICD-10-CM

## 2022-03-22 DIAGNOSIS — J411 Mucopurulent chronic bronchitis: Secondary | ICD-10-CM | POA: Diagnosis not present

## 2022-03-22 DIAGNOSIS — I25118 Atherosclerotic heart disease of native coronary artery with other forms of angina pectoris: Secondary | ICD-10-CM

## 2022-03-22 DIAGNOSIS — E785 Hyperlipidemia, unspecified: Secondary | ICD-10-CM | POA: Diagnosis not present

## 2022-03-22 DIAGNOSIS — R400 Somnolence: Secondary | ICD-10-CM | POA: Diagnosis not present

## 2022-03-22 DIAGNOSIS — J449 Chronic obstructive pulmonary disease, unspecified: Secondary | ICD-10-CM

## 2022-03-22 DIAGNOSIS — R0609 Other forms of dyspnea: Secondary | ICD-10-CM | POA: Diagnosis not present

## 2022-03-22 LAB — HEPATIC FUNCTION PANEL
ALT: 11 IU/L (ref 0–32)
AST: 17 IU/L (ref 0–40)
Albumin: 4.1 g/dL (ref 3.8–4.9)
Alkaline Phosphatase: 172 IU/L — ABNORMAL HIGH (ref 44–121)
Bilirubin Total: 0.2 mg/dL (ref 0.0–1.2)
Bilirubin, Direct: <0.1 mg/dL (ref 0.00–0.40)
Total Protein: 6.9 g/dL (ref 6.0–8.5)

## 2022-03-22 LAB — LIPID PANEL
Chol/HDL Ratio: 5.1 ratio — ABNORMAL HIGH (ref 0.0–4.4)
Cholesterol, Total: 187 mg/dL (ref 100–199)
HDL: 37 mg/dL — ABNORMAL LOW (ref >39)
LDL Chol Calc (NIH): 104 mg/dL — ABNORMAL HIGH (ref 0–99)
Triglycerides: 266 mg/dL — ABNORMAL HIGH (ref 0–149)
VLDL Cholesterol Cal: 46 mg/dL — ABNORMAL HIGH (ref 5–40)

## 2022-03-22 MED ORDER — ISOSORBIDE MONONITRATE ER 30 MG PO TB24
90.0000 mg | ORAL_TABLET | Freq: Every day | ORAL | 3 refills | Status: DC
Start: 2022-03-22 — End: 2023-02-21

## 2022-03-22 MED ORDER — ANORO ELLIPTA 62.5-25 MCG/ACT IN AEPB: 1.0000 | INHALATION_SPRAY | Freq: Every day | RESPIRATORY_TRACT | 0 refills | Status: DC

## 2022-03-22 MED ORDER — NIACIN ER (ANTIHYPERLIPIDEMIC) 1000 MG PO TBCR: 1000.0000 mg | EXTENDED_RELEASE_TABLET | Freq: Every day | ORAL | 3 refills | Status: DC

## 2022-03-22 NOTE — Assessment & Plan Note (Signed)
History of essential hypertension a blood pressure measured today at 124/70.  She is on amlodipine, metoprolol.

## 2022-03-22 NOTE — Progress Notes (Signed)
03/22/2022 [name]   09/21/1960  [id number]  Primary Physician Doreene Nest, [name], MD Primary Cardiologist: Lorretta Harp MD FACP, [place], [place], [place]  HPI:  [name] is a 61 y.o.  severely overweight divorced Caucasian female mother of 2, grandmother and 3 grandchildren who I last saw  in the office 11/05/2020.  Her mother, [name] , as also patient of mine. She is currently in school getting her social workers degree an [employer].She has a history of RCA stenting by Dr. Dr. Doylene Canard in the past. She had coronary artery bypass grafting November 2008 with LIMA to LAD, vein to ramus branch and PDA. She recently stopped smoking 4 months ago. Her palms include hypertension, hyperlipidemia and obstructive sleep apnea intolerant to see. I catheterized her 03/04/10 revealing patent grafts and normal LV function. Her last  Myoview performed 04/18/11 with nonischemic. She was experiencing a lot of stress in her life and developed chest pain. She saw Truitt Merle, nurse practitioner on 02/11/16. At that time, she was up for cardiac catheterization which was performed 4 days later by Dr. Peter Martinique. He demonstrated widely patent grafts, normal LV function and unchanged anatomy compared to her cath performed in 2011.   She was admitted to the hospital with unstable angina 06/20/2019 and discharged 5 days later.  I performed cardiac catheterization on her 06/23/2019 revealing a high-grade distal PDA SVG stenosis which I stented.  She is complicated by no reflow requiring additional stenting and Aggrastat.    She has stopped smoking.   Unfortunately she was readmitted to the hospital with a non-STEMI 03/05/2020 underwent diagnostic coronary angiography by Dr. Pernell Dupre revealing occluded RCA vein graft, the vessel which I had stented in November of last year.  She did have left-to-right collaterals.  Echo showed normal LV function.  Her medicines were adjusted, her ranolazine was increased.    Since I saw her in the office a year and a half ago she has seen Sande Rives, PA-C in the office November 2022.  She is remained clinically stable.  She has occasional chest pain.  She had a Myoview performed 01/06/2022 which was low risk with minimal inferior scar.  She is on multiple antianginal medications including long-acting nitrate, Ranexa, beta-blocker and amlodipine with a known occluded RCA, PDA vein graft and left-to-right collaterals.     Current Meds  Medication Sig   albuterol (PROVENTIL HFA;VENTOLIN HFA) 108 (90 BASE) MCG/ACT inhaler Inhale 2 puffs into the lungs every 6 (six) hours as needed for wheezing or shortness of breath.    aspirin 81 MG chewable tablet Chew 81 mg by mouth daily.   atorvastatin (LIPITOR) 80 MG tablet Take 1 tablet (80 mg total) by mouth daily.   bisacodyl (DULCOLAX) 5 MG EC tablet Take 1 tablet (5 mg total) by mouth daily as needed for moderate constipation. 5 mg laxative tablets x 4 only   BREO ELLIPTA 100-25 MCG/INH AEPB Inhale 1 puff into the lungs daily.    brimonidine (ALPHAGAN) 0.15 % ophthalmic solution Place 1 drop into both eyes 2 (two) times daily.    cholecalciferol (VITAMIN D3) 25 MCG (1000 UNIT) tablet Take 1,000 Units by mouth daily.   clopidogrel (PLAVIX) 75 MG tablet Take 1 tablet (75 mg total) by mouth daily.   escitalopram (LEXAPRO) 10 MG tablet Take 10 mg by mouth daily.   fluticasone (FLONASE) 50 MCG/ACT nasal spray Place 1 spray into both nostrils daily.   icosapent Ethyl (VASCEPA)  1 g capsule TAKE 2 CAPSULES BY MOUTH 2 TIMES DAILY.   lamoTRIgine (LAMICTAL) 150 MG tablet Take 150 mg by mouth daily.    linaclotide (LINZESS) 145 MCG CAPS capsule Take 145 mcg by mouth daily before breakfast.   metoprolol tartrate (LOPRESSOR) 25 MG tablet TAKE 0.5 TABLETS BY MOUTH 2 TIMES DAILY.   nitroGLYCERIN (NITROSTAT) 0.4 MG SL tablet Place 1 tablet (0.4 mg total) under the tongue every 5 (five) minutes as needed for chest pain.   ondansetron  (ZOFRAN) 4 MG tablet Take 1 tablet (4 mg total) by mouth as directed.   ondansetron (ZOFRAN-ODT) 8 MG disintegrating tablet Take 8 mg by mouth every 8 (eight) hours as needed for nausea or vomiting.   PRALUENT 150 MG/ML SOAJ INJECT 1 PEN INTO THE SKIN EVERY 14 DAYS.   QUEtiapine (SEROQUEL) 200 MG tablet Take 125 mg by mouth at bedtime. Takes 1 1/4 tablet (125 mg)   ranolazine (RANEXA) 500 MG 12 hr tablet TAKE 1 TABLET BY MOUTH TWICE A DAY   vitamin B-12 (CYANOCOBALAMIN) 1000 MCG tablet Take 1,000 mcg by mouth daily.   vitamin E 1000 UNIT capsule Take 1,000 Units by mouth daily.   [DISCONTINUED] niacin (NIASPAN) 1000 MG CR tablet TAKE 1 TABLET (1,000 MG TOTAL) BY MOUTH AT BEDTIME.   Current Facility-Administered Medications for the 03/22/22 encounter (Office Visit) with Lorretta Harp, MD  Medication   0.9 %  sodium chloride infusion     Allergies  Allergen Reactions   Benadryl [Diphenhydramine Hcl] Shortness Of Breath and Swelling    Tongue swelling   Codeine Other (See Comments)    "makes me psychotic"    Haldol [Haloperidol Decanoate] Anaphylaxis   Amoxicillin Hives and Swelling    Swelling to extremities Did it involve swelling of the face/tongue/throat, SOB, or low BP? Yes Did it involve sudden or severe rash/hives, skin peeling, or any reaction on the inside of your mouth or nose? Yes Did you need to seek medical attention at a hospital or doctor's office? Yes When did it last happen?    young adult   If all above answers are "NO", may proceed with cephalosporin use.   Bee Venom Swelling and Other (See Comments)    "pimples" from head to toe and whole body swelling    Social History   Socioeconomic History   Marital status: Divorced    Spouse name: Not on file   Number of children: Not on file   Years of education: Not on file   Highest education level: Not on file  Occupational History   Not on file  Tobacco Use   Smoking status: Former    Packs/day: 0.00     Years: 0.00    Total pack years: 0.00    Types: Cigarettes    Quit date: 05/21/2015    Years since quitting: 6.8   Smokeless tobacco: Never   Tobacco comments:    pt states she quit a week ago 06/14/19  Vaping Use   Vaping Use: Never used  Substance and Sexual Activity   Alcohol use: No   Drug use: No   Sexual activity: Not on file  Other Topics Concern   Not on file  Social History Narrative   Not on file   Social Determinants of Health   Financial Resource Strain: Not on file  Food Insecurity: Not on file  Transportation Needs: Not on file  Physical Activity: Not on file  Stress: Not on file  Social Connections:  Not on file  Intimate Partner Violence: Not on file     Review of Systems: General: negative for chills, fever, night sweats or weight changes.  Cardiovascular: negative for chest pain, dyspnea on exertion, edema, orthopnea, palpitations, paroxysmal nocturnal dyspnea or shortness of breath Dermatological: negative for rash Respiratory: negative for cough or wheezing Urologic: negative for hematuria Abdominal: negative for nausea, vomiting, diarrhea, bright red blood per rectum, melena, or hematemesis Neurologic: negative for visual changes, syncope, or dizziness All other systems reviewed and are otherwise negative except as noted above.    Blood pressure 124/70, pulse (!) 58, height '5\' 5"'$  (1.651 m), weight 201 lb (91.2 kg), last menstrual period 11/16/2011.  General appearance: alert and no distress Neck: no adenopathy, no carotid bruit, no JVD, supple, symmetrical, trachea midline, and thyroid not enlarged, symmetric, no tenderness/mass/nodules Lungs: clear to auscultation bilaterally Heart: regular rate and rhythm, S1, S2 normal, no murmur, click, rub or gallop Extremities: extremities normal, atraumatic, no cyanosis or edema Pulses: 2+ and symmetric Skin: Skin color, texture, turgor normal. No rashes or lesions Neurologic: Grossly normal  EKG sinus  bradycardia 58 with LVH voltage, inferior Q waves with early R wave transition and nonspecific ST and T wave changes.  I personally reviewed this EKG.  ASSESSMENT AND PLAN:   Hyperlipidemia LDL goal <70 History of hyperlipidemia on Praluent and high dose atorvastatin with lipid profile performed/6/22 revealing total cholesterol 118, LDL 41 and HDL 49.  We will recheck a lipid liver profile this morning.  Coronary artery disease involving coronary bypass graft History of CAD status post remote RCA stenting by Dr. Doylene Canard with subsequent coronary artery bypass grafting November 2008 with a LIMA to the LAD, vein to ramus branch and PDA.  I catheterized her 06/23/2019 revealing a high-grade distal PDA SVG stenosis which I stented.  This is complicated by no reflow requiring additional stenting and Aggrastat.  She came back in 03/05/2020 with a non-STEMI and underwent cardiac catheterization by Dr. Tamala Julian revealing an occluded native RCA and PDA vein graft with left-to-right collaterals and normal LV function.  Medical therapy was recommended.  She is on multiple antianginal medications including Ranexa with minimal chest pain.  A recent Myoview performed 01/17/2022 showed minimal inferior scar without ischemia.  Essential hypertension History of essential hypertension a blood pressure measured today at 124/70.  She is on amlodipine, metoprolol.     Lorretta Harp MD FACP,FACC,FAHA, [facility] 03/22/2022 9:47 AM

## 2022-03-22 NOTE — Patient Instructions (Addendum)
Medication Instructions:  No Changes In Medications at this time.  *If you need a refill on your cardiac medications before your next appointment, please call your pharmacy*  Lab Work: BLOOD WORK TODAY  If you have labs (blood work) drawn today and your tests are completely normal, you will receive your results only by: [facility] (if you have MyChart) OR A paper copy in the mail If you have any lab test that is abnormal or we need to change your treatment, we will call you to review the results.  Testing/Procedures: None Ordered At This Time.   Follow-Up: At [facility], you and your health needs are our priority.  As part of our continuing mission to provide you with exceptional heart care, we have created designated Provider Care Teams.  These Care Teams include your primary Cardiologist (physician) and Advanced Practice Providers (APPs -  Physician Assistants and Nurse Practitioners) who all work together to provide you with the care you need, when you need it.  Your next appointment:    Sande Rives PA-C in 6 Months  And then   1 year(s)  The format for your next appointment:   In Person  Provider:   Quay Burow, MD

## 2022-03-22 NOTE — Progress Notes (Signed)
Synopsis: Referred in August 2023 for COPD She has a history significant for coronary artery disease and underwent coronary artery bypass grafting in 2008  Subjective:   PATIENT ID: [name]: female DOB: 1961-08-09, MRN: [id number]   HPI  Chief Complaint  Patient presents with   Consult    Referred by PCP for possible COPD. Has constant SOB.     [name] says that she has never been officially diagnosed with COPD so she is here to see me for the same.  She has been coughing up white mucus for years, it is really about the same.  She recently had a flare up of a respiratory infection after exposure to sick kids.  She was treated with medications for the same, though she can't remember the same kinds.  Dyspnea: > has had this for most of her adult life > she gets short of breath and feels tightness in her chest, it feels like there isn't a connection between her lungs and brain and she forgets to breathe, sometimes this is when she is sleepy.   Daytime fatigue: > has a fair amount of this in the daytime > snores a lot > has witnessed   Tobacco abuse: > used to smoke a lot more in the past > started age 66, 2 ppd still smoking 1 per week  She has advanced cardiac disease, CAD.  She has had a CABG.    Record review: June 2023 cardiology visit reviewed where she was seen for coronary artery disease, hypertension, hyperlipidemia and obstructive sleep apnea.  She was continued on dual antiplatelet therapy and antianginal therapy.  Mention was made of starting CPAP through her primary care physician.  Past Medical History:  Diagnosis Date   Allergy    Anemia    takes iron supplement   Arthritis    knees, shoulders   Asthma    Bipolar 1 disorder (HCC)    Blood transfusion without reported diagnosis    Cataract, immature    Clotting disorder (HCC)    COPD (chronic obstructive pulmonary disease) (HCC)    Coronary artery disease    triple vessel   Family history  of adverse reaction to anesthesia    states father "did not do well" with anesthesia; died last time he had surgery; also had heart disease   Gallstones    GERD (gastroesophageal reflux disease)    occasional - no current med.   Glaucoma    Hammertoe 05/2015   right second toe   Heart murmur    Hepatic steatosis    High cholesterol    History of MI (myocardial infarction)    Hypertension    states under control with meds., has been on med. x 8 yr.   Osteoporosis    Panic attacks    Peripheral vascular disease (HCC)    carotid   Renal cyst, left    Schizophrenia (HCC)    Short of breath on exertion    Sleep apnea    no CPAP use   Urinary, incontinence, stress female    Wears dentures    upper   Wears partial dentures    lower     Family History  Problem Relation Age of Onset   Lung cancer Mother    Kidney cancer Mother    Heart disease Father    Anesthesia problems Father        states "did not do well" with anesthesia   Colon polyps Sister  Stomach cancer Neg Hx    Esophageal cancer Neg Hx    Pancreatic cancer Neg Hx    Rectal cancer Neg Hx    Colon cancer Neg Hx      Social History   Socioeconomic History   Marital status: Divorced    Spouse name: Not on file   Number of children: Not on file   Years of education: Not on file   Highest education level: Not on file  Occupational History   Not on file  Tobacco Use   Smoking status: Former    Packs/day: 0.00    Years: 0.00    Total pack years: 0.00    Types: Cigarettes    Quit date: 05/21/2015    Years since quitting: 6.8   Smokeless tobacco: Never   Tobacco comments:    pt states she quit a week ago 06/14/19  Vaping Use   Vaping Use: Never used  Substance and Sexual Activity   Alcohol use: No   Drug use: No   Sexual activity: Not on file  Other Topics Concern   Not on file  Social History Narrative   Not on file   Social Determinants of Health   Financial Resource Strain: Not on file   Food Insecurity: Not on file  Transportation Needs: Not on file  Physical Activity: Not on file  Stress: Not on file  Social Connections: Not on file  Intimate Partner Violence: Not on file     Allergies  Allergen Reactions   Benadryl [Diphenhydramine Hcl] Shortness Of Breath and Swelling    Tongue swelling   Codeine Other (See Comments)    "makes me psychotic"    Haldol [Haloperidol Decanoate] Anaphylaxis   Amoxicillin Hives and Swelling    Swelling to extremities Did it involve swelling of the face/tongue/throat, SOB, or low BP? Yes Did it involve sudden or severe rash/hives, skin peeling, or any reaction on the inside of your mouth or nose? Yes Did you need to seek medical attention at a hospital or doctor's office? Yes When did it last happen?    young adult   If all above answers are "NO", may proceed with cephalosporin use.   Bee Venom Swelling and Other (See Comments)    "pimples" from head to toe and whole body swelling     Outpatient Medications Prior to Visit  Medication Sig Dispense Refill   albuterol (PROVENTIL HFA;VENTOLIN HFA) 108 (90 BASE) MCG/ACT inhaler Inhale 2 puffs into the lungs every 6 (six) hours as needed for wheezing or shortness of breath.      aspirin 81 MG chewable tablet Chew 81 mg by mouth daily.     atorvastatin (LIPITOR) 80 MG tablet Take 1 tablet (80 mg total) by mouth daily. 90 tablet 2   bisacodyl (DULCOLAX) 5 MG EC tablet Take 1 tablet (5 mg total) by mouth daily as needed for moderate constipation. 5 mg laxative tablets x 4 only 4 tablet 0   BREO ELLIPTA 100-25 MCG/INH AEPB Inhale 1 puff into the lungs daily.      brimonidine (ALPHAGAN) 0.15 % ophthalmic solution Place 1 drop into both eyes 2 (two) times daily.      cholecalciferol (VITAMIN D3) 25 MCG (1000 UNIT) tablet Take 1,000 Units by mouth daily.     clopidogrel (PLAVIX) 75 MG tablet Take 1 tablet (75 mg total) by mouth daily. 90 tablet 3   escitalopram (LEXAPRO) 10 MG tablet Take 10  mg by mouth daily.  fluticasone (FLONASE) 50 MCG/ACT nasal spray Place 1 spray into both nostrils daily.     icosapent Ethyl (VASCEPA) 1 g capsule TAKE 2 CAPSULES BY MOUTH 2 TIMES DAILY. 60 capsule 0   isosorbide mononitrate (IMDUR) 30 MG 24 hr tablet Take 3 tablets (90 mg total) by mouth daily. 270 tablet 3   lamoTRIgine (LAMICTAL) 150 MG tablet Take 150 mg by mouth daily.      linaclotide (LINZESS) 145 MCG CAPS capsule Take 145 mcg by mouth daily before breakfast.     metoprolol tartrate (LOPRESSOR) 25 MG tablet TAKE 0.5 TABLETS BY MOUTH 2 TIMES DAILY. 90 tablet 3   niacin (NIASPAN) 1000 MG CR tablet Take 1 tablet (1,000 mg total) by mouth at bedtime. 90 tablet 3   nitroGLYCERIN (NITROSTAT) 0.4 MG SL tablet Place 1 tablet (0.4 mg total) under the tongue every 5 (five) minutes as needed for chest pain. 25 tablet 3   ondansetron (ZOFRAN) 4 MG tablet Take 1 tablet (4 mg total) by mouth as directed. 3 tablet 0   ondansetron (ZOFRAN-ODT) 8 MG disintegrating tablet Take 8 mg by mouth every 8 (eight) hours as needed for nausea or vomiting.     PRALUENT 150 MG/ML SOAJ INJECT 1 PEN INTO THE SKIN EVERY 14 DAYS. 6 mL 3   QUEtiapine (SEROQUEL) 200 MG tablet Take 125 mg by mouth at bedtime. Takes 1 1/4 tablet (125 mg)     ranolazine (RANEXA) 500 MG 12 hr tablet TAKE 1 TABLET BY MOUTH TWICE A DAY 180 tablet 3   vitamin B-12 (CYANOCOBALAMIN) 1000 MCG tablet Take 1,000 mcg by mouth daily.     vitamin E 1000 UNIT capsule Take 1,000 Units by mouth daily.     amLODipine (NORVASC) 5 MG tablet Take 1 tablet (5 mg total) by mouth daily. 90 tablet 3   Facility-Administered Medications Prior to Visit  Medication Dose Route Frequency Provider Last Rate Last Admin   0.9 %  sodium chloride infusion  500 mL Intravenous Once Armbruster, Carlota Raspberry, MD        ROS Gen: Denies fever, chills, weight change, fatigue, night sweats HEENT: Denies blurred vision, double vision, hearing loss, tinnitus, sinus congestion,  rhinorrhea, sore throat, neck stiffness, dysphagia PULM: per HPI CV: Denies chest pain, edema, orthopnea, paroxysmal nocturnal dyspnea, palpitations GI: Denies abdominal pain, nausea, vomiting, diarrhea, hematochezia, melena, constipation, change in bowel habits GU: Denies dysuria, hematuria, polyuria, oliguria, urethral discharge Endocrine: Denies hot or cold intolerance, polyuria, polyphagia or appetite change Derm: Denies rash, dry skin, scaling or peeling skin change Heme: Denies easy bruising, bleeding, bleeding gums Neuro: Denies headache, numbness, weakness, slurred speech, loss of memory or consciousness    Objective:  Physical Exam   Vitals:   03/22/22 1528  BP: 128/72  Pulse: (!) 58  SpO2: 98%  Weight: 201 lb (91.2 kg)  Height: '5\' 5"'$  (1.651 m)   Walked 750 feet on RA and O2 saturation stayed at 97% on RA  Gen: well appearing, no acute distress HENT: NCAT, OP clear, neck supple without masses Eyes: PERRL, EOMi Lymph: no cervical lymphadenopathy PULM: Crackles bases B CV: RRR, no mgr, no JVD GI: BS+, soft, nontender, no hsm Derm: no rash or skin breakdown MSK: normal bulk and tone Neuro: A&Ox4, CN II-XII intact, strength 5/5 in all 4 extremities Psyche: normal mood and affect   CBC    Component Value Date/Time   WBC 15.0 (H) 11/26/2020 1926   RBC 4.49 11/26/2020 1926   HGB 12.3  11/26/2020 1926   HCT 38.8 11/26/2020 1926   PLT 304 11/26/2020 1926   MCV 86.4 11/26/2020 1926   MCH 27.4 11/26/2020 1926   MCHC 31.7 11/26/2020 1926   RDW 19.0 (H) 11/26/2020 1926   LYMPHSABS 2.5 11/26/2020 1926   MONOABS 0.5 11/26/2020 1926   EOSABS 0.0 11/26/2020 1926   BASOSABS 0.1 11/26/2020 1926     Chest imaging: 2021 chest x-ray images independently reviewed showing normal pulmonary parenchyma, mildly enlarged cardiac silhouette  PFT: March 22, 2022 spirometry ratio 67%, FEV1 1.7 L 64% predicted  Labs: 11/2020 Hgb 12.3 gm/dL  Path:  Echo: 05/2021 TTE> LVEF  55-60%, RV normal size and function, LAE, valves OK  Heart Catheterization: Left Cardiac Catheterization 03/01/2020: s/p CABG, IMA to LAD patent, disease noted in SVG's, total occlusion of SVG to PDA. LVEF 40%, LVEDP 35mHg   Sleep study: 2009 showed AHI of 21, CPAP titration recommended 3 to 8 cm of water auto titration     Assessment & Plan:   DOE (dyspnea on exertion) - Plan: Spirometry with graph  Mucopurulent chronic bronchitis (HCC)  COPD with chronic bronchitis and emphysema (HCC)  Daytime sleepiness - Plan: Split night study  Discussion: SJaneehas GOLD C COPD based on moderate airflow obstruction and having 1 COPD exacerbation in the last year.  I believe that she is still smoking cigarettes, and this is likely contributing.  Also, she is noncompliant with Breo.  Moving forward we will see if we can convince her to quit smoking and to use Anoro 1 puff daily.  However, if she still having poorly controlled symptoms or recurrent exacerbations at that point then we may want to consider blood work to look for evidence of underlying eosinophilia or other problems which may be contributing.  Plan: COPD: Today's lung function testing confirmed a diagnosis of COPD Stop Breo Start taking Anoro 1 puff daily no matter how you feel Use albuterol as needed for shortness of breath: 2 puffs every 4-6 hours as needed Stop smoking cigarettes altogether Get a flu shot every year Practice good hand hygiene Stay active  Cigarette smoking: We will try to get a copy of the CT scan performed earlier this week Stay away from cigarettes altogether  Daytime sleepiness: I think it is very likely you have sleep apnea Because you have a history of cardiac disease we need to do an in lab sleep study, we will order this  We will see you back with a nurse practitioner in 1 week to see how you are doing with the Anoro sample and then we will likely prescribe it if it seems to be  helpful      Current Outpatient Medications:    albuterol (PROVENTIL HFA;VENTOLIN HFA) 108 (90 BASE) MCG/ACT inhaler, Inhale 2 puffs into the lungs every 6 (six) hours as needed for wheezing or shortness of breath. , Disp: , Rfl:    aspirin 81 MG chewable tablet, Chew 81 mg by mouth daily., Disp: , Rfl:    atorvastatin (LIPITOR) 80 MG tablet, Take 1 tablet (80 mg total) by mouth daily., Disp: 90 tablet, Rfl: 2   bisacodyl (DULCOLAX) 5 MG EC tablet, Take 1 tablet (5 mg total) by mouth daily as needed for moderate constipation. 5 mg laxative tablets x 4 only, Disp: 4 tablet, Rfl: 0   BREO ELLIPTA 100-25 MCG/INH AEPB, Inhale 1 puff into the lungs daily. , Disp: , Rfl:    brimonidine (ALPHAGAN) 0.15 % ophthalmic solution, Place 1 drop  into both eyes 2 (two) times daily. , Disp: , Rfl:    cholecalciferol (VITAMIN D3) 25 MCG (1000 UNIT) tablet, Take 1,000 Units by mouth daily., Disp: , Rfl:    clopidogrel (PLAVIX) 75 MG tablet, Take 1 tablet (75 mg total) by mouth daily., Disp: 90 tablet, Rfl: 3   escitalopram (LEXAPRO) 10 MG tablet, Take 10 mg by mouth daily., Disp: , Rfl:    fluticasone (FLONASE) 50 MCG/ACT nasal spray, Place 1 spray into both nostrils daily., Disp: , Rfl:    icosapent Ethyl (VASCEPA) 1 g capsule, TAKE 2 CAPSULES BY MOUTH 2 TIMES DAILY., Disp: 60 capsule, Rfl: 0   isosorbide mononitrate (IMDUR) 30 MG 24 hr tablet, Take 3 tablets (90 mg total) by mouth daily., Disp: 270 tablet, Rfl: 3   lamoTRIgine (LAMICTAL) 150 MG tablet, Take 150 mg by mouth daily. , Disp: , Rfl:    linaclotide (LINZESS) 145 MCG CAPS capsule, Take 145 mcg by mouth daily before breakfast., Disp: , Rfl:    metoprolol tartrate (LOPRESSOR) 25 MG tablet, TAKE 0.5 TABLETS BY MOUTH 2 TIMES DAILY., Disp: 90 tablet, Rfl: 3   niacin (NIASPAN) 1000 MG CR tablet, Take 1 tablet (1,000 mg total) by mouth at bedtime., Disp: 90 tablet, Rfl: 3   nitroGLYCERIN (NITROSTAT) 0.4 MG SL tablet, Place 1 tablet (0.4 mg total) under the  tongue every 5 (five) minutes as needed for chest pain., Disp: 25 tablet, Rfl: 3   ondansetron (ZOFRAN) 4 MG tablet, Take 1 tablet (4 mg total) by mouth as directed., Disp: 3 tablet, Rfl: 0   ondansetron (ZOFRAN-ODT) 8 MG disintegrating tablet, Take 8 mg by mouth every 8 (eight) hours as needed for nausea or vomiting., Disp: , Rfl:    PRALUENT 150 MG/ML SOAJ, INJECT 1 PEN INTO THE SKIN EVERY 14 DAYS., Disp: 6 mL, Rfl: 3   QUEtiapine (SEROQUEL) 200 MG tablet, Take 125 mg by mouth at bedtime. Takes 1 1/4 tablet (125 mg), Disp: , Rfl:    ranolazine (RANEXA) 500 MG 12 hr tablet, TAKE 1 TABLET BY MOUTH TWICE A DAY, Disp: 180 tablet, Rfl: 3   vitamin B-12 (CYANOCOBALAMIN) 1000 MCG tablet, Take 1,000 mcg by mouth daily., Disp: , Rfl:    vitamin E 1000 UNIT capsule, Take 1,000 Units by mouth daily., Disp: , Rfl:    amLODipine (NORVASC) 5 MG tablet, Take 1 tablet (5 mg total) by mouth daily., Disp: 90 tablet, Rfl: 3  Current Facility-Administered Medications:    0.9 %  sodium chloride infusion, 500 mL, Intravenous, Once, Armbruster, Carlota Raspberry, MD

## 2022-03-22 NOTE — Assessment & Plan Note (Signed)
History of CAD status post remote RCA stenting by Dr. Doylene Canard with subsequent coronary artery bypass grafting November 2008 with a LIMA to the LAD, vein to ramus branch and PDA.  I catheterized her 06/23/2019 revealing a high-grade distal PDA SVG stenosis which I stented.  This is complicated by no reflow requiring additional stenting and Aggrastat.  She came back in 03/05/2020 with a non-STEMI and underwent cardiac catheterization by Dr. Tamala Julian revealing an occluded native RCA and PDA vein graft with left-to-right collaterals and normal LV function.  Medical therapy was recommended.  She is on multiple antianginal medications including Ranexa with minimal chest pain.  A recent Myoview performed 01/17/2022 showed minimal inferior scar without ischemia.

## 2022-03-22 NOTE — Addendum Note (Signed)
Addended by: Valerie Salts on: 03/22/2022 04:25 PM   Modules accepted: Orders

## 2022-03-22 NOTE — Assessment & Plan Note (Signed)
History of hyperlipidemia on Praluent and high dose atorvastatin with lipid profile performed/6/22 revealing total cholesterol 118, LDL 41 and HDL 49.  We will recheck a lipid liver profile this morning.

## 2022-03-22 NOTE — Patient Instructions (Signed)
COPD: Today's lung function testing confirmed a diagnosis of COPD Stop Breo Start taking Anoro 1 puff daily no matter how you feel Use albuterol as needed for shortness of breath: 2 puffs every 4-6 hours as needed Stop smoking cigarettes altogether Get a flu shot every year Practice good hand hygiene Stay active  Cigarette smoking: We will try to get a copy of the CT scan performed earlier this week Stay away from cigarettes altogether  We will see you back with a nurse practitioner in 1 week to see how you are doing with the Anoro sample and then we will likely prescribe it if it seems to be helpful

## 2022-03-31 ENCOUNTER — Ambulatory Visit: Payer: Medicare Other | Admitting: Nurse Practitioner

## 2022-04-18 ENCOUNTER — Encounter (HOSPITAL_BASED_OUTPATIENT_CLINIC_OR_DEPARTMENT_OTHER): Payer: Medicare Other | Admitting: Pulmonary Disease

## 2022-04-28 ENCOUNTER — Telehealth: Payer: Self-pay | Admitting: Cardiovascular Disease

## 2022-04-28 DIAGNOSIS — E785 Hyperlipidemia, unspecified: Secondary | ICD-10-CM

## 2022-04-28 NOTE — Telephone Encounter (Signed)
Patient returned RN's call. 

## 2022-04-28 NOTE — Telephone Encounter (Signed)
Lorretta Harp, MD  03/23/2022  9:40 AM EDT     FLP signif elevated c/w 2 years ago on high dose statin and Praluent. Call pt and see if there's an explanation. Medication compliance, diet etc   Spoke with patient regarding the following results. Patient made aware and patient verbalized understanding.  Patient states that she is currently taking Praulent every 2 weeks no missed doses, Atorvastatin 60m once daily, and still taking Vascepa as ordered. Patient confirms she was fasting for labs and reports that she has been eating a lot of soft foods such as potatoes and mac and cheese due to her teeth bothering her. Advised patient I would forward message over to Dr. BGwenlyn Foundfor him to review and advise. Patient verbalized understanding.

## 2022-05-01 NOTE — Telephone Encounter (Signed)
Left a message for the patient to call back.  

## 2022-05-01 NOTE — Telephone Encounter (Signed)
Patient is returning RN's call. Please advise. 

## 2022-05-01 NOTE — Telephone Encounter (Signed)
Attempted to call patient, left message for patient to call back to office.   

## 2022-05-09 NOTE — Telephone Encounter (Signed)
Lorretta Harp, MD  Bullins, Belinda Block, RN 9 days ago    Perhaps adhere to a heart healthy diet and re check FLP 3 months    Spoke with pt regarding Dr. Kennon Holter recommendations. Orders placed for lab work to be done in 3 months and mailed to pt's home address. Pt verbalizes understanding.

## 2022-05-19 ENCOUNTER — Encounter (HOSPITAL_BASED_OUTPATIENT_CLINIC_OR_DEPARTMENT_OTHER): Payer: Medicare Other | Admitting: Pulmonary Disease

## 2022-06-15 ENCOUNTER — Ambulatory Visit (HOSPITAL_BASED_OUTPATIENT_CLINIC_OR_DEPARTMENT_OTHER): Payer: Medicare Other | Admitting: Pulmonary Disease

## 2022-06-17 ENCOUNTER — Other Ambulatory Visit: Payer: Self-pay | Admitting: Student

## 2022-08-11 ENCOUNTER — Telehealth: Payer: Self-pay | Admitting: Cardiovascular Disease

## 2022-08-11 DIAGNOSIS — I10 Essential (primary) hypertension: Secondary | ICD-10-CM

## 2022-08-11 DIAGNOSIS — I25708 Atherosclerosis of coronary artery bypass graft(s), unspecified, with other forms of angina pectoris: Secondary | ICD-10-CM

## 2022-08-11 DIAGNOSIS — E785 Hyperlipidemia, unspecified: Secondary | ICD-10-CM

## 2022-08-11 MED ORDER — CLOPIDOGREL BISULFATE 75 MG PO TABS: 75.0000 mg | ORAL_TABLET | Freq: Every day | ORAL | 1 refills | Status: DC

## 2022-08-11 MED ORDER — ICOSAPENT ETHYL 1 G PO CAPS: 2.0000 g | ORAL_CAPSULE | Freq: Two times a day (BID) | ORAL | 2 refills | Status: DC

## 2022-08-11 MED ORDER — ATORVASTATIN CALCIUM 80 MG PO TABS: 80.0000 mg | ORAL_TABLET | Freq: Every day | ORAL | 1 refills | Status: DC

## 2022-08-11 NOTE — Telephone Encounter (Signed)
Per chart Atorvastatin last sent to pharmacy 08/05/20, #90, 2 refills. Patient is overdue for refill on medication.  Per labs 03/22/22: Lorretta Harp, MD 03/23/2022  9:40 AM EDT     FLP signif elevated c/w 2 years ago on high dose statin and Praluent. Call pt and see if there's an explanation. Medication compliance, diet etc   Patient states she had not been taking Atorvastatin regularly. She has recently started back on this and now needs a refill.   Advised patient on importance of consistency with medications. She will have labs repeated before next OV 2/24.   Rxs sent to pharmacy.

## 2022-08-11 NOTE — Telephone Encounter (Signed)
 *  STAT* If patient is at the pharmacy, call can be transferred to refill team.   1. Which medications need to be refilled? (please list name of each medication and dose if known)   atorvastatin (LIPITOR) 80 MG tablet    clopidogrel (PLAVIX) 75 MG tablet    icosapent Ethyl (VASCEPA) 1 g capsule   2. Which pharmacy/location (including street and city if local pharmacy) is medication to be sent to? [id number]- [place], [place] - [address]    3. Do they need a 30 day or 90 day supply? 90 days

## 2022-09-15 ENCOUNTER — Other Ambulatory Visit: Payer: Self-pay | Admitting: Student

## 2022-09-15 DIAGNOSIS — I25708 Atherosclerosis of coronary artery bypass graft(s), unspecified, with other forms of angina pectoris: Secondary | ICD-10-CM

## 2022-10-03 ENCOUNTER — Ambulatory Visit: Payer: Medicare Other | Attending: Cardiovascular Disease | Admitting: Cardiovascular Disease

## 2022-10-03 ENCOUNTER — Encounter: Payer: Self-pay | Admitting: Cardiovascular Disease

## 2022-10-03 VITALS — BP 118/84 | HR 67 | Ht 65.0 in | Wt 206.0 lb

## 2022-10-03 DIAGNOSIS — E785 Hyperlipidemia, unspecified: Secondary | ICD-10-CM

## 2022-10-03 DIAGNOSIS — I25708 Atherosclerosis of coronary artery bypass graft(s), unspecified, with other forms of angina pectoris: Secondary | ICD-10-CM | POA: Diagnosis not present

## 2022-10-03 DIAGNOSIS — I10 Essential (primary) hypertension: Secondary | ICD-10-CM | POA: Diagnosis not present

## 2022-10-03 DIAGNOSIS — G4733 Obstructive sleep apnea (adult) (pediatric): Secondary | ICD-10-CM

## 2022-10-03 NOTE — Assessment & Plan Note (Addendum)
History of hyperlipidemia on high-dose statin therapy, Vascepa and Praluent with lipid profile performed 03/22/2022 revealing total cholesterol 187, LDL of 104 and HDL 37.  Apparently she had not been taking her statin when this study was performed.  We will recheck a lipid liver today.

## 2022-10-03 NOTE — Assessment & Plan Note (Signed)
History of CAD status post RCA stenting by Dr. Doylene Canard in the past.  She had coronary artery bypass grafting November 2008 with a LIMA to LAD, vein to ramus branch and PDA.  Her most recent cardiac catheterization performed by Dr. Tamala Julian in the setting of non-STEMI 03/05/2020 was notable for an occluded native RCA and RCA vein graft, patent vein to ramus branch and patent LIMA to LAD with normal LV function.  She does get some chest heaviness and left arm pain.  She is on amlodipine, Imdur and ranolazine.  I reviewed her most recent Myoview performed 01/06/2022 which is low risk and nonischemic.

## 2022-10-03 NOTE — Assessment & Plan Note (Signed)
History of essential hypertension blood pressure measured today 119/84.  She is on amlodipine, and metoprolol.

## 2022-10-03 NOTE — Patient Instructions (Addendum)
Medication Instructions:  Your physician recommends that you continue on your current medications as directed. Please refer to the Current Medication list given to you today.  *If you need a refill on your cardiac medications before your next appointment, please call your pharmacy*   Lab Work: Your physician recommends that you have labs drawn today: lipid/liver panel  If you have labs (blood work) drawn today and your tests are completely normal, you will receive your results only by: [place] (if you have MyChart) OR A paper copy in the mail If you have any lab test that is abnormal or we need to change your treatment, we will call you to review the results.   Follow-Up: At [facility], you and your health needs are our priority.  As part of our continuing mission to provide you with exceptional heart care, we have created designated Provider Care Teams.  These Care Teams include your primary Cardiologist (physician) and Advanced Practice Providers (APPs -  Physician Assistants and Nurse Practitioners) who all work together to provide you with the care you need, when you need it.  We recommend signing up for the patient portal called "MyChart".  Sign up information is provided on this After Visit Summary.  MyChart is used to connect with patients for Virtual Visits (Telemedicine).  Patients are able to view lab/test results, encounter notes, upcoming appointments, etc.  Non-urgent messages can be sent to your provider as well.   To learn more about what you can do with MyChart, go to [email or web address].    Your next appointment:   6 month(s)  Provider:   Fabian Sharp, PA-C, Sande Rives, PA-C, Caron Presume, PA-C, Jory Sims, DNP, ANP, Almyra Deforest, PA-C, or Diona Browner, NP      Then, Quay Burow, MD will plan to see you again in 12 month(s).

## 2022-10-03 NOTE — Progress Notes (Signed)
10/03/2022 [name]   [birth year]  [id number]  Primary Physician Doreene Nest, [name], MD Primary Cardiologist: Lorretta Harp MD FACP, [place], [place], [place]  HPI:  [name] is a 62 y.o.    severely overweight divorced Caucasian female mother of 2, grandmother and 3 grandchildren who I last saw  in the office 03/22/2022.  Her mother, [name] , as also patient of mine. She is currently in school getting her social workers degree an [place].She has a history of RCA stenting by Dr. Dr. Doylene Canard in the past. She had coronary artery bypass grafting November 2008 with LIMA to LAD, vein to ramus branch and PDA. She recently stopped smoking 4 months ago. Her palms include hypertension, hyperlipidemia and obstructive sleep apnea intolerant to see. I catheterized her 03/04/10 revealing patent grafts and normal LV function. Her last  Myoview performed 04/18/11 with nonischemic. She was experiencing a lot of stress in her life and developed chest pain. She saw Truitt Merle, nurse practitioner on 02/11/16. At that time, she was up for cardiac catheterization which was performed 4 days later by Dr. Peter Martinique. He demonstrated widely patent grafts, normal LV function and unchanged anatomy compared to her cath performed in 2011.   She was admitted to the hospital with unstable angina 06/20/2019 and discharged 5 days later.  I performed cardiac catheterization on her 06/23/2019 revealing a high-grade distal PDA SVG stenosis which I stented.  She is complicated by no reflow requiring additional stenting and Aggrastat.    She has stopped smoking.   Unfortunately she was readmitted to the hospital with a non-STEMI 03/05/2020 underwent diagnostic coronary angiography by Dr. Pernell Dupre revealing occluded RCA vein graft, the vessel which I had stented in November of last year.  She did have left-to-right collaterals.  Echo showed normal LV function.  Her medicines were adjusted, her ranolazine was increased.    She had a Myoview performed 01/06/2022 which was low risk with minimal inferior scar.  She is on multiple antianginal medications including long-acting nitrate, Ranexa, beta-blocker and amlodipine with a known occluded RCA, PDA vein graft and left-to-right collaterals.  She does get occasional chest and arm pain.  Since I saw her 6 months ago she is back on her medications.  She says she feels a little "puffy".  I suspect she has symptoms of obstructive sleep apnea.  She feels chronically fatigued but is unable to wear CPAP.   Current Meds  Medication Sig   albuterol (PROVENTIL HFA;VENTOLIN HFA) 108 (90 BASE) MCG/ACT inhaler Inhale 2 puffs into the lungs every 6 (six) hours as needed for wheezing or shortness of breath.    amLODipine (NORVASC) 5 MG tablet Take 1 tablet (5 mg total) by mouth daily.   aspirin 81 MG chewable tablet Chew 81 mg by mouth daily.   atorvastatin (LIPITOR) 80 MG tablet Take 1 tablet (80 mg total) by mouth daily.   brimonidine (ALPHAGAN) 0.15 % ophthalmic solution Place 1 drop into both eyes 2 (two) times daily.    cholecalciferol (VITAMIN D3) 25 MCG (1000 UNIT) tablet Take 1,000 Units by mouth daily.   clopidogrel (PLAVIX) 75 MG tablet Take 1 tablet (75 mg total) by mouth daily.   escitalopram (LEXAPRO) 10 MG tablet Take 10 mg by mouth daily.   fluticasone (FLONASE) 50 MCG/ACT nasal spray Place 1 spray into both nostrils daily.   icosapent Ethyl (VASCEPA) 1 g capsule Take 2 capsules (2 g total) by mouth 2 (two)  times daily.   isosorbide mononitrate (IMDUR) 30 MG 24 hr tablet Take 3 tablets (90 mg total) by mouth daily.   lamoTRIgine (LAMICTAL) 150 MG tablet Take 150 mg by mouth daily.    metoprolol tartrate (LOPRESSOR) 25 MG tablet TAKE 0.5 TABLETS BY MOUTH 2 TIMES DAILY.   niacin (NIASPAN) 1000 MG CR tablet Take 1 tablet (1,000 mg total) by mouth at bedtime.   nitroGLYCERIN (NITROSTAT) 0.4 MG SL tablet Place 1 tablet (0.4 mg total) under the tongue every 5 (five)  minutes as needed for chest pain.   PRALUENT 150 MG/ML SOAJ INJECT 1 PEN INTO THE SKIN EVERY 14 DAYS.   QUEtiapine (SEROQUEL) 200 MG tablet Take 125 mg by mouth at bedtime. Takes 1 1/4 tablet (125 mg)   ranolazine (RANEXA) 500 MG 12 hr tablet TAKE 1 TABLET BY MOUTH TWICE A DAY   umeclidinium-vilanterol (ANORO ELLIPTA) 62.5-25 MCG/ACT AEPB Inhale 1 puff into the lungs daily.   vitamin B-12 (CYANOCOBALAMIN) 1000 MCG tablet Take 1,000 mcg by mouth daily.   [DISCONTINUED] BREO ELLIPTA 100-25 MCG/INH AEPB Inhale 1 puff into the lungs daily.    [DISCONTINUED] ondansetron (ZOFRAN) 4 MG tablet Take 1 tablet (4 mg total) by mouth as directed.   [DISCONTINUED] ondansetron (ZOFRAN-ODT) 8 MG disintegrating tablet Take 8 mg by mouth every 8 (eight) hours as needed for nausea or vomiting.   [DISCONTINUED] vitamin E 1000 UNIT capsule Take 1,000 Units by mouth daily.   Current Facility-Administered Medications for the 10/03/22 encounter (Office Visit) with Lorretta Harp, MD  Medication   0.9 %  sodium chloride infusion     Allergies  Allergen Reactions   Benadryl [Diphenhydramine Hcl] Shortness Of Breath and Swelling    Tongue swelling   Codeine Other (See Comments)    "makes me psychotic"    Haldol [Haloperidol Decanoate] Anaphylaxis   Amoxicillin Hives and Swelling    Swelling to extremities Did it involve swelling of the face/tongue/throat, SOB, or low BP? Yes Did it involve sudden or severe rash/hives, skin peeling, or any reaction on the inside of your mouth or nose? Yes Did you need to seek medical attention at a hospital or doctor's office? Yes When did it last happen?    young adult   If all above answers are "NO", may proceed with cephalosporin use.   Bee Venom Swelling and Other (See Comments)    "pimples" from head to toe and whole body swelling    Social History   Socioeconomic History   Marital status: Divorced    Spouse name: Not on file   Number of children: Not on file    Years of education: Not on file   Highest education level: Not on file  Occupational History   Not on file  Tobacco Use   Smoking status: Former    Packs/day: 0.00    Years: 0.00    Total pack years: 0.00    Types: Cigarettes    Quit date: 05/21/2015    Years since quitting: 7.3   Smokeless tobacco: Never   Tobacco comments:    pt states she quit a week ago 06/14/19  Vaping Use   Vaping Use: Never used  Substance and Sexual Activity   Alcohol use: No   Drug use: No   Sexual activity: Not on file  Other Topics Concern   Not on file  Social History Narrative   Not on file   Social Determinants of Health   Financial Resource Strain: Not on  file  Food Insecurity: Not on file  Transportation Needs: Not on file  Physical Activity: Not on file  Stress: Not on file  Social Connections: Not on file  Intimate Partner Violence: Not on file     Review of Systems: General: negative for chills, fever, night sweats or weight changes.  Cardiovascular: negative for chest pain, dyspnea on exertion, edema, orthopnea, palpitations, paroxysmal nocturnal dyspnea or shortness of breath Dermatological: negative for rash Respiratory: negative for cough or wheezing Urologic: negative for hematuria Abdominal: negative for nausea, vomiting, diarrhea, bright red blood per rectum, melena, or hematemesis Neurologic: negative for visual changes, syncope, or dizziness All other systems reviewed and are otherwise negative except as noted above.    Blood pressure 118/84, pulse 67, height 5' 5"$  (1.651 m), weight 206 lb (93.4 kg), last menstrual period 11/16/2011.  General appearance: alert and no distress Neck: no adenopathy, no carotid bruit, no JVD, supple, symmetrical, trachea midline, and thyroid not enlarged, symmetric, no tenderness/mass/nodules Lungs: clear to auscultation bilaterally Heart: regular rate and rhythm, S1, S2 normal, no murmur, click, rub or gallop Extremities: extremities  normal, atraumatic, no cyanosis or edema Pulses: 2+ and symmetric Skin: Skin color, texture, turgor normal. No rashes or lesions Neurologic: Grossly normal  EKG sinus rhythm at 67 with LVH and repolarization changes, close inferior Q waves early R wave transition.  I personally reviewed this EKG.  ASSESSMENT AND PLAN:   Hyperlipidemia LDL goal <70 History of hyperlipidemia on high-dose statin therapy, Vascepa and Praluent with lipid profile performed 03/22/2022 revealing total cholesterol 187, LDL of 104 and HDL 37.  Apparently she had not been taking her statin when this study was performed.  We will recheck a lipid liver today.  Coronary artery disease involving coronary bypass graft History of CAD status post RCA stenting by Dr. Doylene Canard in the past.  She had coronary artery bypass grafting November 2008 with a LIMA to LAD, vein to ramus branch and PDA.  Her most recent cardiac catheterization performed by Dr. Tamala Julian in the setting of non-STEMI 03/05/2020 was notable for an occluded native RCA and RCA vein graft, patent vein to ramus branch and patent LIMA to LAD with normal LV function.  She does get some chest heaviness and left arm pain.  She is on amlodipine, Imdur and ranolazine.  I reviewed her most recent Myoview performed 01/06/2022 which is low risk and nonischemic.  Essential hypertension History of essential hypertension blood pressure measured today 119/84.  She is on amlodipine, and metoprolol.     Lorretta Harp MD FACP,FACC,FAHA, [facility] 10/03/2022 3:13 PM

## 2022-10-04 LAB — HEPATIC FUNCTION PANEL
ALT: 17 IU/L (ref 0–32)
AST: 19 IU/L (ref 0–40)
Albumin: 4.2 g/dL (ref 3.8–4.9)
Alkaline Phosphatase: 180 IU/L — ABNORMAL HIGH (ref 44–121)
Bilirubin Total: <0.2 mg/dL (ref 0.0–1.2)
Bilirubin, Direct: <0.1 mg/dL (ref 0.00–0.40)
Total Protein: 7 g/dL (ref 6.0–8.5)

## 2022-10-04 LAB — LIPID PANEL
Chol/HDL Ratio: 2.5 ratio (ref 0.0–4.4)
Cholesterol, Total: 128 mg/dL (ref 100–199)
HDL: 51 mg/dL (ref >39)
LDL Chol Calc (NIH): 47 mg/dL (ref 0–99)
Triglycerides: 185 mg/dL — ABNORMAL HIGH (ref 0–149)
VLDL Cholesterol Cal: 30 mg/dL (ref 5–40)

## 2022-10-10 ENCOUNTER — Ambulatory Visit: Payer: Medicare Other | Attending: Cardiology | Admitting: Cardiology

## 2022-10-10 ENCOUNTER — Telehealth: Payer: Self-pay | Admitting: *Deleted

## 2022-10-10 ENCOUNTER — Encounter: Payer: Self-pay | Admitting: Cardiology

## 2022-10-10 VITALS — BP 132/76 | HR 73 | Ht 65.0 in | Wt 206.6 lb

## 2022-10-10 DIAGNOSIS — G4733 Obstructive sleep apnea (adult) (pediatric): Secondary | ICD-10-CM | POA: Insufficient documentation

## 2022-10-10 NOTE — Progress Notes (Deleted)
Sleep Medicine CONSULT Note    Date:  10/10/2022   ID:  [name], DOB [birth year], MRN [id number]  PCP:  Katherina Mires, MD  Cardiologist: Quay Burow, MD   No chief complaint on file.   History of Present Illness:  [name] is a 62 y.o. female who is being seen today for the evaluation of OSA at the request of Quay Burow, MD.  This is a 62 year old female with a history of COPD, CAD, GERD, hypertension, PAD.  Past Medical History:  Diagnosis Date   Allergy    Anemia    takes iron supplement   Arthritis    knees, shoulders   Asthma    Bipolar 1 disorder (HCC)    Blood transfusion without reported diagnosis    Cataract, immature    Clotting disorder (HCC)    COPD (chronic obstructive pulmonary disease) (HCC)    Coronary artery disease    triple vessel   Family history of adverse reaction to anesthesia    states father "did not do well" with anesthesia; died last time he had surgery; also had heart disease   Gallstones    GERD (gastroesophageal reflux disease)    occasional - no current med.   Glaucoma    Hammertoe 05/2015   right second toe   Heart murmur    Hepatic steatosis    High cholesterol    History of MI (myocardial infarction)    Hypertension    states under control with meds., has been on med. x 8 yr.   Osteoporosis    Panic attacks    Peripheral vascular disease (HCC)    carotid   Renal cyst, left    Schizophrenia (HCC)    Short of breath on exertion    Sleep apnea    no CPAP use   Urinary, incontinence, stress female    Wears dentures    upper   Wears partial dentures    lower    Past Surgical History:  Procedure Laterality Date   C-SECTIONS X [address]  12/10/2006; 12/18/2006; 07/10/2007; 07/19/2009   CARDIAC CATHETERIZATION  03/04/2010   patent grafts, normal LV function, diffusely diseased RCA (Dr. Adora Fridge)   CARDIAC CATHETERIZATION N/A 02/15/2016   Procedure: Left Heart Cath  and Cors/Grafts Angiography;  Surgeon: Peter M Martinique, MD;  Location: [place] [facility];  Service: Cardiovascular;  Laterality: N/A;   CLUB FOOT RELEASE Bilateral    1st and 2nd toes   COLONOSCOPY     CORONARY ARTERY BYPASS GRAFT  07/16/2007   LIMA to LAD, VG to ramus, VG to PDA    CORONARY STENT INTERVENTION N/A 06/23/2019   Procedure: CORONARY STENT INTERVENTION;  Surgeon: Lorretta Harp, MD;  Location: [place] [facility];  Service: Cardiovascular;  Laterality: N/A;   FLEXIBLE BRONCHOSCOPY  07/16/2007   HAMMERTOE RECONSTRUCTION WITH WEIL OSTEOTOMY Right 06/03/2015   Procedure: RIGHT SECOND HAMMERTOE CORRECTION WITH MT WEIL OSTEOTOMY;  Surgeon: Wylene Simmer, MD;  Location: [facility];  Service: Orthopedics;  Laterality: Right;   HAND SURGERY Bilateral    1st and 2nd fingers - release of clubbing   KNEE ARTHROSCOPY WITH ANTERIOR CRUCIATE LIGAMENT (ACL) REPAIR Right 01/30/2008   LEFT HEART CATH AND CORS/GRAFTS ANGIOGRAPHY N/A 06/23/2019   Procedure: LEFT HEART CATH AND CORS/GRAFTS ANGIOGRAPHY;  Surgeon: Lorretta Harp, MD;  Location: [place] [facility];  Service: Cardiovascular;  Laterality: N/A;  LEFT HEART CATH AND CORS/GRAFTS ANGIOGRAPHY N/A 03/01/2020   Procedure: LEFT HEART CATH AND CORS/GRAFTS ANGIOGRAPHY;  Surgeon: Belva Crome, MD;  Location: [facility];  Service: Cardiovascular;  Laterality: N/A;   NOVASURE ABLATION  12/19/2007   with removal of IUD   ORIF FOOT FRACTURE Left    TOE SURGERIES TO REMOVE NAILS DUE TO CLUBBING     TOTAL KNEE ARTHROPLASTY Right 10/21/2012   Procedure: TOTAL KNEE ARTHROPLASTY;  Surgeon: Mauri Pole, MD;  Location: [facility];  Service: Orthopedics;  Laterality: Right;   UPPER GASTROINTESTINAL ENDOSCOPY      Current Medications: Current Meds  Medication Sig   albuterol (PROVENTIL HFA;VENTOLIN HFA) 108 (90 BASE) MCG/ACT inhaler Inhale 2 puffs into the lungs every 6 (six) hours as needed for wheezing or shortness of breath.     amLODipine (NORVASC) 5 MG tablet Take 1 tablet (5 mg total) by mouth daily.   aspirin 81 MG chewable tablet Chew 81 mg by mouth daily.   atorvastatin (LIPITOR) 80 MG tablet Take 1 tablet (80 mg total) by mouth daily.   brimonidine (ALPHAGAN) 0.15 % ophthalmic solution Place 1 drop into both eyes 2 (two) times daily.    clopidogrel (PLAVIX) 75 MG tablet Take 1 tablet (75 mg total) by mouth daily.   escitalopram (LEXAPRO) 10 MG tablet Take 10 mg by mouth daily.   fluticasone (FLONASE) 50 MCG/ACT nasal spray Place 1 spray into both nostrils daily.   icosapent Ethyl (VASCEPA) 1 g capsule Take 2 capsules (2 g total) by mouth 2 (two) times daily.   isosorbide mononitrate (IMDUR) 30 MG 24 hr tablet Take 3 tablets (90 mg total) by mouth daily.   lamoTRIgine (LAMICTAL) 150 MG tablet Take 150 mg by mouth daily.    metoprolol tartrate (LOPRESSOR) 25 MG tablet TAKE 0.5 TABLETS BY MOUTH 2 TIMES DAILY.   niacin (NIASPAN) 1000 MG CR tablet Take 1 tablet (1,000 mg total) by mouth at bedtime.   nitroGLYCERIN (NITROSTAT) 0.4 MG SL tablet Place 1 tablet (0.4 mg total) under the tongue every 5 (five) minutes as needed for chest pain.   PRALUENT 150 MG/ML SOAJ INJECT 1 PEN INTO THE SKIN EVERY 14 DAYS.   QUEtiapine (SEROQUEL) 200 MG tablet Take 125 mg by mouth at bedtime. Takes 1 1/4 tablet (125 mg)   ranolazine (RANEXA) 500 MG 12 hr tablet TAKE 1 TABLET BY MOUTH TWICE A DAY   Current Facility-Administered Medications for the 10/10/22 encounter (Office Visit) with Sueanne Margarita, MD  Medication   0.9 %  sodium chloride infusion    Allergies:   Benadryl [diphenhydramine hcl], Codeine, Haldol [haloperidol decanoate], Amoxicillin, and Bee venom   Social History   Socioeconomic History   Marital status: Divorced    Spouse name: Not on file   Number of children: Not on file   Years of education: Not on file   Highest education level: Not on file  Occupational History   Not on file  Tobacco Use   Smoking  status: Some Days    Packs/day: 0.00    Years: 0.00    Total pack years: 0.00    Types: Cigarettes    Last attempt to quit: 05/21/2015    Years since quitting: 7.3   Smokeless tobacco: Never   Tobacco comments:    pt states she quit a week ago 06/14/19  Vaping Use   Vaping Use: Never used  Substance and Sexual Activity   Alcohol use: No  Drug use: No   Sexual activity: Not on file  Other Topics Concern   Not on file  Social History Narrative   Not on file   Social Determinants of Health   Financial Resource Strain: Not on file  Food Insecurity: Not on file  Transportation Needs: Not on file  Physical Activity: Not on file  Stress: Not on file  Social Connections: Not on file     Family History:  The patient's family history includes Anesthesia problems in her father; Colon polyps in her sister; Heart disease in her father; Kidney cancer in her mother; Lung cancer in her mother.   ROS:   Please see the history of present illness.    ROS All other systems reviewed and are negative.      No data to display             PHYSICAL EXAM:   VS:  BP 132/76   Pulse 73   Ht 5' 5"$  (1.651 m)   Wt 206 lb 9.6 oz (93.7 kg)   LMP 11/16/2011   SpO2 96%   BMI 34.38 kg/m    GEN: Well nourished, well developed, in no acute distress  HEENT: normal  Neck: no JVD, carotid bruits, or masses Cardiac: RRR; no murmurs, rubs, or gallops,no edema.  Intact distal pulses bilaterally.  Respiratory:  clear to auscultation bilaterally, normal work of breathing GI: soft, nontender, nondistended, + BS MS: no deformity or atrophy  Skin: warm and dry, no rash Neuro:  Alert and Oriented x 3, Strength and sensation are intact Psych: euthymic mood, full affect  Wt Readings from Last 3 Encounters:  10/10/22 206 lb 9.6 oz (93.7 kg)  10/03/22 206 lb (93.4 kg)  03/22/22 201 lb (91.2 kg)      Studies/Labs Reviewed:   none  Recent Labs: 10/03/2022: ALT 17    Additional studies/  records that were reviewed today include:  none    ASSESSMENT:    1. Obstructive sleep apnea      PLAN:  In order of problems listed above:  OSA    Time Spent: 20 minutes total time of encounter, including 15 minutes spent in face-to-face patient care on the date of this encounter. This time includes coordination of care and counseling regarding above mentioned problem list. Remainder of non-face-to-face time involved reviewing chart documents/testing relevant to the patient encounter and documentation in the medical record. I have independently reviewed documentation from referring provider  Medication Adjustments/Labs and Tests Ordered: Current medicines are reviewed at length with the patient today.  Concerns regarding medicines are outlined above.  Medication changes, Labs and Tests ordered today are listed in the Patient Instructions below.  There are no Patient Instructions on file for this visit.   Signed, Fransico Him, MD  10/10/2022 1:02 PM    [facility] [address] [place], [place], [place]  [address] Phone: [phone]; Fax: [phone]

## 2022-10-10 NOTE — Addendum Note (Signed)
Addended by: [name] on: 10/10/2022 01:34 PM   Modules accepted: Orders

## 2022-10-10 NOTE — Patient Instructions (Signed)
Medication Instructions:  Your physician recommends that you continue on your current medications as directed. Please refer to the Current Medication list given to you today.  *If you need a refill on your cardiac medications before your next appointment, please call your pharmacy*   Lab Work: None.  If you have labs (blood work) drawn today and your tests are completely normal, you will receive your results only by: [place] (if you have MyChart) OR A paper copy in the mail If you have any lab test that is abnormal or we need to change your treatment, we will call you to review the results.   Testing/Procedures: Your physician has recommended that you have a sleep study. This test records several body functions during sleep, including: brain activity, eye movement, oxygen and carbon dioxide blood levels, heart rate and rhythm, breathing rate and rhythm, the flow of air through your mouth and nose, snoring, body muscle movements, and chest and belly movement.    Follow-Up:  Your next appointment will be dependent on the results of your study and it will be with:    Provider:   Dr. Fransico Him, MD

## 2022-10-10 NOTE — Progress Notes (Signed)
Sleep Medicine CONSULT Note    Date:  10/10/2022   ID:  BUFFY JARECKI, DOB 10/02/1960, MRN [id number]  PCP:  Katherina Mires, MD  Cardiologist: Quay Burow, MD   Chief Complaint  Patient presents with   Sleep Apnea    History of Present Illness:  [name] is a 62 y.o. female who is being seen today for the evaluation of OSA at the request of Quay Burow, MD.  This is a 63yo female with a hx of OSA dx about 10 years ago and was placed on CPAP and she tried it for 6 months but stopped it because she was so claustrophobic and could not keep the mask on.  She says that she is able to tolerate a nasal cannula but could not tolerate the nasal mask or FFM.  She has been told that she snores and stops breathing in her sleep.  She is always tired but for 30 years worked 3rd shift so she goes to bed at 2am and gets up at 10am.  She gets up multiple times nightly to urinate.  She feels tired all day but dose not nap during the day.   Past Medical History:  Diagnosis Date   Allergy    Anemia    takes iron supplement   Arthritis    knees, shoulders   Asthma    Bipolar 1 disorder (HCC)    Blood transfusion without reported diagnosis    Cataract, immature    Clotting disorder (HCC)    COPD (chronic obstructive pulmonary disease) (HCC)    Coronary artery disease    triple vessel   Family history of adverse reaction to anesthesia    states father "did not do well" with anesthesia; died last time he had surgery; also had heart disease   Gallstones    GERD (gastroesophageal reflux disease)    occasional - no current med.   Glaucoma    Hammertoe 05/2015   right second toe   Heart murmur    Hepatic steatosis    High cholesterol    History of MI (myocardial infarction)    Hypertension    states under control with meds., has been on med. x 8 yr.   Osteoporosis    Panic attacks    Peripheral vascular disease (HCC)    carotid   Renal cyst, left    Schizophrenia (HCC)     Short of breath on exertion    Sleep apnea    no CPAP use   Urinary, incontinence, stress female    Wears dentures    upper   Wears partial dentures    lower    Past Surgical History:  Procedure Laterality Date   C-SECTIONS X [place]  12/10/2006; 12/18/2006; 07/10/2007; 07/19/2009   CARDIAC CATHETERIZATION  03/04/2010   patent grafts, normal LV function, diffusely diseased RCA (Dr. Adora Fridge)   CARDIAC CATHETERIZATION N/A 02/15/2016   Procedure: Left Heart Cath and Cors/Grafts Angiography;  Surgeon: Peter M Martinique, MD;  Location: [facility];  Service: Cardiovascular;  Laterality: N/A;   CLUB FOOT RELEASE Bilateral    1st and 2nd toes   COLONOSCOPY     CORONARY ARTERY BYPASS GRAFT  07/16/2007   LIMA to LAD, VG to ramus, VG to PDA    CORONARY STENT INTERVENTION N/A 06/23/2019   Procedure: CORONARY STENT INTERVENTION;  Surgeon: Lorretta Harp, MD;  Location: [facility];  Service: Cardiovascular;  Laterality: N/A;   FLEXIBLE BRONCHOSCOPY  07/16/2007   HAMMERTOE RECONSTRUCTION WITH WEIL OSTEOTOMY Right 06/03/2015   Procedure: RIGHT SECOND HAMMERTOE CORRECTION WITH MT WEIL OSTEOTOMY;  Surgeon: Wylene Simmer, MD;  Location: [facility];  Service: Orthopedics;  Laterality: Right;   HAND SURGERY Bilateral    1st and 2nd fingers - release of clubbing   KNEE ARTHROSCOPY WITH ANTERIOR CRUCIATE LIGAMENT (ACL) REPAIR Right 01/30/2008   LEFT HEART CATH AND CORS/GRAFTS ANGIOGRAPHY N/A 06/23/2019   Procedure: LEFT HEART CATH AND CORS/GRAFTS ANGIOGRAPHY;  Surgeon: Lorretta Harp, MD;  Location: [facility];  Service: Cardiovascular;  Laterality: N/A;   LEFT HEART CATH AND CORS/GRAFTS ANGIOGRAPHY N/A 03/01/2020   Procedure: LEFT HEART CATH AND CORS/GRAFTS ANGIOGRAPHY;  Surgeon: Belva Crome, MD;  Location: [facility];  Service: Cardiovascular;  Laterality: N/A;   NOVASURE ABLATION  12/19/2007   with removal of IUD    ORIF FOOT FRACTURE Left    TOE SURGERIES TO REMOVE NAILS DUE TO CLUBBING     TOTAL KNEE ARTHROPLASTY Right 10/21/2012   Procedure: TOTAL KNEE ARTHROPLASTY;  Surgeon: Mauri Pole, MD;  Location: [facility];  Service: Orthopedics;  Laterality: Right;   UPPER GASTROINTESTINAL ENDOSCOPY      Current Medications: Current Meds  Medication Sig   albuterol (PROVENTIL HFA;VENTOLIN HFA) 108 (90 BASE) MCG/ACT inhaler Inhale 2 puffs into the lungs every 6 (six) hours as needed for wheezing or shortness of breath.    amLODipine (NORVASC) 5 MG tablet Take 1 tablet (5 mg total) by mouth daily.   aspirin 81 MG chewable tablet Chew 81 mg by mouth daily.   atorvastatin (LIPITOR) 80 MG tablet Take 1 tablet (80 mg total) by mouth daily.   brimonidine (ALPHAGAN) 0.15 % ophthalmic solution Place 1 drop into both eyes 2 (two) times daily.    clopidogrel (PLAVIX) 75 MG tablet Take 1 tablet (75 mg total) by mouth daily.   escitalopram (LEXAPRO) 10 MG tablet Take 10 mg by mouth daily.   fluticasone (FLONASE) 50 MCG/ACT nasal spray Place 1 spray into both nostrils daily.   icosapent Ethyl (VASCEPA) 1 g capsule Take 2 capsules (2 g total) by mouth 2 (two) times daily.   isosorbide mononitrate (IMDUR) 30 MG 24 hr tablet Take 3 tablets (90 mg total) by mouth daily.   lamoTRIgine (LAMICTAL) 150 MG tablet Take 150 mg by mouth daily.    metoprolol tartrate (LOPRESSOR) 25 MG tablet TAKE 0.5 TABLETS BY MOUTH 2 TIMES DAILY.   niacin (NIASPAN) 1000 MG CR tablet Take 1 tablet (1,000 mg total) by mouth at bedtime.   nitroGLYCERIN (NITROSTAT) 0.4 MG SL tablet Place 1 tablet (0.4 mg total) under the tongue every 5 (five) minutes as needed for chest pain.   PRALUENT 150 MG/ML SOAJ INJECT 1 PEN INTO THE SKIN EVERY 14 DAYS.   QUEtiapine (SEROQUEL) 200 MG tablet Take 125 mg by mouth at bedtime. Takes 1 1/4 tablet (125 mg)   ranolazine (RANEXA) 500 MG 12 hr tablet TAKE 1 TABLET BY MOUTH TWICE A DAY   Current Facility-Administered  Medications for the 10/10/22 encounter (Office Visit) with Sueanne Margarita, MD  Medication   0.9 %  sodium chloride infusion    Allergies:   Benadryl [diphenhydramine hcl], Codeine, Haldol [haloperidol decanoate], Amoxicillin, and Bee venom   Social History   Socioeconomic History   Marital status: Divorced    Spouse name: Not on file   Number of  children: Not on file   Years of education: Not on file   Highest education level: Not on file  Occupational History   Not on file  Tobacco Use   Smoking status: Some Days    Packs/day: 0.00    Years: 0.00    Total pack years: 0.00    Types: Cigarettes    Last attempt to quit: 05/21/2015    Years since quitting: 7.3   Smokeless tobacco: Never   Tobacco comments:    pt states she quit a week ago 06/14/19  Vaping Use   Vaping Use: Never used  Substance and Sexual Activity   Alcohol use: No   Drug use: No   Sexual activity: Not on file  Other Topics Concern   Not on file  Social History Narrative   Not on file   Social Determinants of Health   Financial Resource Strain: Not on file  Food Insecurity: Not on file  Transportation Needs: Not on file  Physical Activity: Not on file  Stress: Not on file  Social Connections: Not on file     Family History:  The patient's family history includes Anesthesia problems in her father; Colon polyps in her sister; Heart disease in her father; Kidney cancer in her mother; Lung cancer in her mother.   ROS:   Please see the history of present illness.    ROS All other systems reviewed and are negative.      No data to display             PHYSICAL EXAM:   VS:  BP 132/76   Pulse 73   Ht 5' 5"$  (1.651 m)   Wt 206 lb 9.6 oz (93.7 kg)   LMP 11/16/2011   SpO2 96%   BMI 34.38 kg/m    GEN: Well nourished, well developed, in no acute distress  HEENT: normal  Neck: no JVD, carotid bruits, or masses Cardiac: RRR; no murmurs, rubs, or gallops,no edema.  Intact distal pulses  bilaterally.  Respiratory:  clear to auscultation bilaterally, normal work of breathing GI: soft, nontender, nondistended, + BS MS: no deformity or atrophy  Skin: warm and dry, no rash Neuro:  Alert and Oriented x 3, Strength and sensation are intact Psych: euthymic mood, full affect  Wt Readings from Last 3 Encounters:  10/10/22 206 lb 9.6 oz (93.7 kg)  10/03/22 206 lb (93.4 kg)  03/22/22 201 lb (91.2 kg)      Studies/Labs Reviewed:   None  Recent Labs: 10/03/2022: ALT 17    Additional studies/ records that were reviewed today include:  none    ASSESSMENT:    1. Obstructive sleep apnea      PLAN:  In order of problems listed above:  OSA  -she has a hx of remote OSA I the past that she was told was severe over 10 years ago -she did not tolerate the PAP mask in the past because of being claustrophobic -she has excessive daytime sleepiness with a Stop Bang score of 5 -I will get a HST to assess for OSA.  If she has an AHI > 15/hr she may qualify for the Inspire device but would have to try CPAP for at least 6 months prior to that  Time Spent: 20 minutes total time of encounter, including 15 minutes spent in face-to-face patient care on the date of this encounter. This time includes coordination of care and counseling regarding above mentioned problem list. Remainder of non-face-to-face time involved reviewing  chart documents/testing relevant to the patient encounter and documentation in the medical record. I have independently reviewed documentation from referring provider  Medication Adjustments/Labs and Tests Ordered: Current medicines are reviewed at length with the patient today.  Concerns regarding medicines are outlined above.  Medication changes, Labs and Tests ordered today are listed in the Patient Instructions below.  There are no Patient Instructions on file for this visit.   Signed, Fransico Him, MD  10/10/2022 1:18 PM    [facility]  [facility] [address], [place], [place]  [address] Phone: [phone]; Fax: [phone]

## 2022-10-10 NOTE — Telephone Encounter (Signed)
Dr. Radford Pax order [place] study while pt was in the office. Pt is agreeable to singed waiver and to not open the box until called with the PIN#.

## 2022-10-11 NOTE — Telephone Encounter (Signed)
Called and made the patient aware that she may proceed with the [facility] Sleep Study. PIN # provided to the patient. Patient made aware that she will be contacted after the test has been read with the results and any recommendations. Patient verbalized understanding and thanked me for the call.    Pt has been given PIN # [id number]. Pt states she will do study tomorrow night. Pt thanked me for the call.

## 2022-10-11 NOTE — Telephone Encounter (Signed)
Prior Authorization for [facility] sent to [facility] via web portal. Tracking Number . NO PA REQ

## 2022-10-14 ENCOUNTER — Encounter (INDEPENDENT_AMBULATORY_CARE_PROVIDER_SITE_OTHER): Payer: Medicare Other | Admitting: Cardiology

## 2022-10-14 DIAGNOSIS — G4733 Obstructive sleep apnea (adult) (pediatric): Secondary | ICD-10-CM

## 2022-10-15 ENCOUNTER — Ambulatory Visit: Payer: Medicare Other | Attending: Cardiology

## 2022-10-15 DIAGNOSIS — G4733 Obstructive sleep apnea (adult) (pediatric): Secondary | ICD-10-CM

## 2022-10-15 NOTE — Procedures (Signed)
SLEEP STUDY REPORT Patient Information Study Date: 10/14/2022 Patient Name: [name] Patient ID: [id number] Birth Date: 1961/07/07 Age: 62 Gender: Female BMI: 34.5 (W=207 lb, H=5' 5'') Stopbang: 5 Referring Physician: Fransico Him, MD  TEST DESCRIPTION: Home sleep apnea testing was completed using the WatchPat, a Type 1 device, utilizing peripheral arterial tonometry (PAT), chest movement, actigraphy, pulse oximetry, pulse rate, body position and snore.  AHI was calculated with apnea and hypopnea using valid sleep time as the denominator. RDI includes apneas, hypopneas, and RERAs.  The data acquired and the scoring of sleep and all associated events were performed in accordance with the recommended standards and specifications as outlined in the AASM Manual for the Scoring of Sleep and Associated Events 2.2.0 (2015).  FINDINGS:  1.  Severe Obstructive Sleep Apnea with AHI 33.2/hr.   2.  No significant Central Sleep Apnea with pAHIc 7.4/hr.  3.  Oxygen desaturations as low as 80%.  4.  Moderate snoring was present. O2 sats were < 88% for 0.2 min.  5.  Total sleep time was 7 hrs and 19 min.  6.  25 % of total sleep time was spent in REM sleep.   7.  Prolonged sleep onset latency at 5 min.   8.  Prolonged REM sleep onset latency at 221 min.   9.  Total awakenings were 36.  10. Arrhythmia detection:  None.  DIAGNOSIS:   Severe Obstructive Sleep Apnea (G47.33)  RECOMMENDATIONS:   1.  Clinical correlation of these findings is necessary.  The decision to treat obstructive sleep apnea (OSA) is usually based on the presence of apnea symptoms or the presence of associated medical conditions such as Hypertension, Congestive Heart Failure, Atrial Fibrillation or Obesity.  The most common symptoms of OSA are snoring, gasping for breath while sleeping, daytime sleepiness and fatigue.   2.  Initiating apnea therapy is recommended given the presence of symptoms and/or associated  conditions. Recommend proceeding with one of the following:     a.  Auto-CPAP therapy with a pressure range of 5-20cm H2O.     b.  An oral appliance (OA) that can be obtained from certain dentists with expertise in sleep medicine.  These are primarily of use in non-obese patients with mild and moderate disease.     c.  An ENT consultation which may be useful to look for specific causes of obstruction and possible treatment options.     d.  If patient is intolerant to PAP therapy, consider referral to ENT for evaluation for hypoglossal nerve stimulator.   3.  Close follow-up is necessary to ensure success with CPAP or oral appliance therapy for maximum benefit.  4.  A follow-up oximetry study on CPAP is recommended to assess the adequacy of therapy and determine the need for supplemental oxygen or the potential need for Bi-level therapy.  An arterial blood gas to determine the adequacy of baseline ventilation and oxygenation should also be considered.  5.  Healthy sleep recommendations include:  adequate nightly sleep (normal 7-9 hrs/night), avoidance of caffeine after noon and alcohol near bedtime, and maintaining a sleep environment that is cool, dark and quiet.  6.  Weight loss for overweight patients is recommended.  Even modest amounts of weight loss can significantly improve the severity of sleep apnea.  7.  Snoring recommendations include:  weight loss where appropriate, side sleeping, and avoidance of alcohol before bed.  8.  Operation of motor vehicle should be avoided when sleepy.  Signature: Fransico Him,  MD; Leonidas Romberg; Diplomat, American Board of Sleep Medicine Electronically Signed: 10/15/2022

## 2022-10-19 ENCOUNTER — Other Ambulatory Visit: Payer: Self-pay | Admitting: Cardiovascular Disease

## 2022-10-30 ENCOUNTER — Telehealth: Payer: Self-pay | Admitting: *Deleted

## 2022-10-30 DIAGNOSIS — I25708 Atherosclerosis of coronary artery bypass graft(s), unspecified, with other forms of angina pectoris: Secondary | ICD-10-CM

## 2022-10-30 DIAGNOSIS — I10 Essential (primary) hypertension: Secondary | ICD-10-CM

## 2022-10-30 DIAGNOSIS — G4733 Obstructive sleep apnea (adult) (pediatric): Secondary | ICD-10-CM

## 2022-10-30 NOTE — Telephone Encounter (Signed)
-----   Message from Lauralee Evener, [place] sent at 10/16/2022  9:23 AM EST -----  ----- Message ----- From: Sueanne Margarita, MD Sent: 10/15/2022   5:40 PM EST To: [facility] Sleep Studies  Please let patient know that they have sleep apnea.  Recommend therapeutic CPAP titration for treatment of patient's sleep disordered breathing.  If unable to perform an in lab titration then initiate ResMed auto CPAP from 4 to 15cm H2O with heated humidity and mask of choice and overnight pulse ox on CPAP.

## 2022-10-30 NOTE — Telephone Encounter (Signed)
The patient has been notified of the result and verbalized understanding.  All questions (if any) were answered. [name], [place] 10/30/2022 5:44 PM    Patient is very claustrophobic and knows she can not wear a cpap mask on her face. Per Dr Radford Pax" If she has an AHI > 15/hr she may qualify for the [facility] device but would have to try CPAP for at least 6 months prior to that". Patient says I am not going to be able to do that. I will Per Dr Radford Pax order her then APAP machine "   If unable to perform an in lab titration then initiate ResMed auto CPAP from 4 to 15cm H2O with heated humidity and mask of choice and overnight pulse ox on CPAP.    Upon patient request DME selection is [facility] Patient understands he will be contacted by [facility] to set up his cpap. Patient understands to call if [facility] does not contact him with new setup in a timely manner. Patient understands they will be called once confirmation has been received from [facility] that they have received their new machine to schedule 10 week follow up appointment.   [facility] notified of new cpap order  Please add to airview Patient was grateful for the call and thanked me.

## 2022-11-07 ENCOUNTER — Telehealth: Payer: Self-pay | Admitting: Cardiology

## 2022-11-07 NOTE — Telephone Encounter (Signed)
Attempted to call patient to clarify sleep apnea care plan. Unable to leave message, voice mail box full.   Epic notes state patient spoke to D. Jones on 10/30/22:   "The patient has been notified of the result and verbalized understanding.  All questions (if any) were answered. Marolyn Hammock, [place] 10/30/2022 5:44 PM     Patient is very claustrophobic and knows she can not wear a cpap mask on her face. Per Dr Radford Pax" If she has an AHI > 15/hr she may qualify for the [facility] device but would have to try CPAP for at least 6 months prior to that". Patient says I am not going to be able to do that. I will Per Dr Radford Pax order her then APAP machine "    If unable to perform an in lab titration then initiate ResMed auto CPAP from 4 to 15cm H2O with heated humidity and mask of choice and overnight pulse ox on CPAP.     Upon patient request DME selection is [facility] Patient understands he will be contacted by [facility] to set up his cpap. Patient understands to call if [facility] does not contact him with new setup in a timely manner. Patient understands they will be called once confirmation has been received from [facility] that they have received their new machine to schedule 10 week follow up appointment.   [facility] notified of new cpap order "

## 2022-11-07 NOTE — Telephone Encounter (Signed)
  Per MyChart scheduling message:  Just curious to see if the doctors have discussed what plans they have for my sleep apnea considering I can not wear the CPAP. This is stressful, waiting that is. Thank you again for your time and efforts. [name]

## 2022-11-09 ENCOUNTER — Telehealth: Payer: Self-pay | Admitting: Cardiology

## 2022-11-09 NOTE — Telephone Encounter (Signed)
Patient sent message to scheduling:  "Someone called yesterday trying to set up delivery on CPAP. I can't seem to get through to doctors that "I can not wear anything that covers my mouth or nose. I have PTSD with anything covering my face. It seems back from 2008. I have had 2 failed previous attempts with both the full face mask and the nose piece. I tried the nose piece and I couldn't breathe because I'm a mouth breather. Since this diagnosis I haven't slept much because now I'm terrified I want wake up. Stressed is an Haematologist and I don't understand why I just can't get the implant. Dr Quay Burow sent me here specifically so I could get the implant due to my failed previous attempts. If you can't do the implant please simply say " we can't help you" and I would understand. Also people treat me differently when they see schizophrenia on my records of which I'm not. I'm in the process trying to get this off my record from where some nurse way back put this on my record from [facility]. Yes, I'm bi polar but treated for the last 30 years with little to no relapses in my mental health. I hope you understand my delima and that I'm begging for the implant. Thank you for your time and I look forward to hearing Fr m you soon on this important matter at hand. Thank you, Amiria"

## 2022-11-13 NOTE — Telephone Encounter (Signed)
Called patient on [phone] to discuss Dr. Theodosia Blender recommendations regarding Inspire device, unable to leave message as mail box was full. Number listed on DPR for message is no longer working.

## 2022-11-15 NOTE — Telephone Encounter (Signed)
Called patient to discuss Dr. Theodosia Blender recommendation that unfortunately, cpap trials would  be the only way patient could be qualified for nocturnal oxygen. Asked patient if she wanted to be referred for cpap trials, advising that she could continue to think about it while she waits for insurance approval and scheduling. Patient  states "Thank you for your help and research but I will respectfully decline any further treatment." Patient states she is not willing to complete any further testing and is willing to "live with the condition as it is." Forwarded patient response to Dr. Radford Pax.

## 2022-12-25 ENCOUNTER — Encounter: Payer: Self-pay | Admitting: Gastroenterology

## 2022-12-25 ENCOUNTER — Ambulatory Visit (INDEPENDENT_AMBULATORY_CARE_PROVIDER_SITE_OTHER): Payer: Medicare Other | Admitting: Gastroenterology

## 2022-12-25 ENCOUNTER — Other Ambulatory Visit: Payer: Medicare Other

## 2022-12-25 VITALS — BP 130/80 | HR 60 | Ht 65.0 in | Wt 201.0 lb

## 2022-12-25 DIAGNOSIS — Z8619 Personal history of other infectious and parasitic diseases: Secondary | ICD-10-CM | POA: Diagnosis not present

## 2022-12-25 DIAGNOSIS — K8020 Calculus of gallbladder without cholecystitis without obstruction: Secondary | ICD-10-CM | POA: Insufficient documentation

## 2022-12-25 DIAGNOSIS — R1310 Dysphagia, unspecified: Secondary | ICD-10-CM | POA: Insufficient documentation

## 2022-12-25 DIAGNOSIS — K5909 Other constipation: Secondary | ICD-10-CM

## 2022-12-25 DIAGNOSIS — R1011 Right upper quadrant pain: Secondary | ICD-10-CM | POA: Diagnosis not present

## 2022-12-25 MED ORDER — LINACLOTIDE 145 MCG PO CAPS: 145.0000 ug | ORAL_CAPSULE | Freq: Every day | ORAL | 3 refills | Status: DC

## 2022-12-25 NOTE — Progress Notes (Signed)
12/25/2022 [name] [id number] Oct 02, 1960   HISTORY OF PRESENT ILLNESS: This is a 62 year old female who is a patient of Dr. Lauro Franklin.  She presents here today with complaints of dysphagia and abdominal pain.  She says she has been having issues with food getting stuck intermittently again for about the past year or so.  She says that occurs a couple times a month.  She denies having a lot of acid reflux.  Is not on any PPI therapy.  She also complains of generalized abdominal discomfort and bloating/fullness sensation.  She thinks that it is due to her H. pylori that it was not successfully treated when was diagnosed on her EGD in June 2022 as below.  Never returned for follow-up stool Ag after treatment.  Says this pain/fullness is the same symptoms that have been present for the past 3 years.  In the past 3 years she has had a CT scan for the same symptoms that was unrevealing.  Also had EGD and colonoscopy.  She also reports intermittent sharp right upper quadrant abdominal pains.  She does have gallstones.  She says that she was told previously that she would have this evaluated to see about having it taken out.  In regards to her constipation, she is on Linzess 145 mcg daily and says that that works well for her.  EGD 01/2021:  - Esophagogastric landmarks identified. - 1 cm hiatal hernia. - Erythematous mucosa in the gastric body. - Normal stomach otherwise - biopsies taken to rule out H pylori - Normal duodenal bulb and second portion of the duodenum.  Surgical [P], gastric antrum and body - CHRONIC ACTIVE GASTRITIS WITH HELICOBACTER PYLORI [WARTHIN-STARRY STAIN PERFORMED].  Treated with 10-day course of Pylera and omeprazole twice daily.  Past Medical History:  Diagnosis Date   Allergy    Anemia    takes iron supplement   Arthritis    knees, shoulders   Asthma    Bipolar 1 disorder (HCC)    Blood transfusion without reported diagnosis    Cataract, immature     Clotting disorder (HCC)    COPD (chronic obstructive pulmonary disease) (HCC)    Coronary artery disease    triple vessel   Family history of adverse reaction to anesthesia    states father "did not do well" with anesthesia; died last time he had surgery; also had heart disease   Gallstones    GERD (gastroesophageal reflux disease)    occasional - no current med.   Glaucoma    Hammertoe 05/2015   right second toe   Heart murmur    Hepatic steatosis    High cholesterol    History of MI (myocardial infarction)    Hypertension    states under control with meds., has been on med. x 8 yr.   Osteoporosis    Panic attacks    Peripheral vascular disease (HCC)    carotid   Renal cyst, left    Schizophrenia (HCC)    Short of breath on exertion    Sleep apnea    no CPAP use   Urinary, incontinence, stress female    Wears dentures    upper   Wears partial dentures    lower   Past Surgical History:  Procedure Laterality Date   C-SECTIONS X 2  1985, 1988   CARDIAC CATHETERIZATION  12/10/2006; 12/18/2006; 07/10/2007; 07/19/2009   CARDIAC CATHETERIZATION  03/04/2010   patent grafts, normal LV function, diffusely diseased RCA (Dr. Shela Commons.  Berry)   CARDIAC CATHETERIZATION N/A 02/15/2016   Procedure: Left Heart Cath and Cors/Grafts Angiography;  Surgeon: Peter M Swaziland, MD;  Location: [facility] INVASIVE CV LAB;  Service: Cardiovascular;  Laterality: N/A;   CLUB FOOT RELEASE Bilateral    1st and 2nd toes   COLONOSCOPY     CORONARY ARTERY BYPASS GRAFT  07/16/2007   LIMA to LAD, VG to ramus, VG to PDA    CORONARY STENT INTERVENTION N/A 06/23/2019   Procedure: CORONARY STENT INTERVENTION;  Surgeon: Runell Gess, MD;  Location: [facility] INVASIVE CV LAB;  Service: Cardiovascular;  Laterality: N/A;   FLEXIBLE BRONCHOSCOPY  07/16/2007   HAMMERTOE RECONSTRUCTION WITH WEIL OSTEOTOMY Right 06/03/2015   Procedure: RIGHT SECOND HAMMERTOE CORRECTION WITH MT WEIL OSTEOTOMY;  Surgeon: Toni Arthurs, MD;  Location:  [facility];  Service: Orthopedics;  Laterality: Right;   HAND SURGERY Bilateral    1st and 2nd fingers - release of clubbing   KNEE ARTHROSCOPY WITH ANTERIOR CRUCIATE LIGAMENT (ACL) REPAIR Right 01/30/2008   LEFT HEART CATH AND CORS/GRAFTS ANGIOGRAPHY N/A 06/23/2019   Procedure: LEFT HEART CATH AND CORS/GRAFTS ANGIOGRAPHY;  Surgeon: Runell Gess, MD;  Location: [facility] INVASIVE CV LAB;  Service: Cardiovascular;  Laterality: N/A;   LEFT HEART CATH AND CORS/GRAFTS ANGIOGRAPHY N/A 03/01/2020   Procedure: LEFT HEART CATH AND CORS/GRAFTS ANGIOGRAPHY;  Surgeon: Lyn Records, MD;  Location: [facility] INVASIVE CV LAB;  Service: Cardiovascular;  Laterality: N/A;   NOVASURE ABLATION  12/19/2007   with removal of IUD   ORIF FOOT FRACTURE Left    TOE SURGERIES TO REMOVE NAILS DUE TO CLUBBING     TOTAL KNEE ARTHROPLASTY Right 10/21/2012   Procedure: TOTAL KNEE ARTHROPLASTY;  Surgeon: Shelda Pal, MD;  Location: [facility];  Service: Orthopedics;  Laterality: Right;   UPPER GASTROINTESTINAL ENDOSCOPY      reports that she has been smoking cigarettes. She has never used smokeless tobacco. She reports that she does not drink alcohol and does not use drugs. family history includes Anesthesia problems in her father; Colon polyps in her sister; Heart disease in her father; Kidney cancer in her mother; Lung cancer in her mother. Allergies  Allergen Reactions   Benadryl [Diphenhydramine Hcl] Shortness Of Breath and Swelling    Tongue swelling   Codeine Other (See Comments)    "makes me psychotic"    Haldol [Haloperidol Decanoate] Anaphylaxis   Amoxicillin Hives and Swelling    Swelling to extremities Did it involve swelling of the face/tongue/throat, SOB, or low BP? Yes Did it involve sudden or severe rash/hives, skin peeling, or any reaction on the inside of your mouth or nose? Yes Did you need to seek medical attention at a hospital or doctor's office? Yes When did it last happen?    young adult    If all above answers are "NO", may proceed with cephalosporin use.   Bee Venom Swelling and Other (See Comments)    "pimples" from head to toe and whole body swelling      Outpatient Encounter Medications as of 12/25/2022  Medication Sig   albuterol (PROVENTIL HFA;VENTOLIN HFA) 108 (90 BASE) MCG/ACT inhaler Inhale 2 puffs into the lungs every 6 (six) hours as needed for wheezing or shortness of breath.    amLODipine (NORVASC) 5 MG tablet Take 1 tablet (5 mg total) by mouth daily.   aspirin 81 MG chewable tablet Chew 81 mg by mouth daily.   atorvastatin (LIPITOR) 80 MG tablet Take 1 tablet (80 mg  total) by mouth daily.   brimonidine (ALPHAGAN) 0.15 % ophthalmic solution Place 1 drop into both eyes 2 (two) times daily.    clopidogrel (PLAVIX) 75 MG tablet Take 1 tablet (75 mg total) by mouth daily.   escitalopram (LEXAPRO) 10 MG tablet Take 10 mg by mouth daily.   fluticasone (FLONASE) 50 MCG/ACT nasal spray Place 1 spray into both nostrils daily.   icosapent Ethyl (VASCEPA) 1 g capsule Take 2 capsules (2 g total) by mouth 2 (two) times daily.   isosorbide mononitrate (IMDUR) 30 MG 24 hr tablet Take 3 tablets (90 mg total) by mouth daily.   lamoTRIgine (LAMICTAL) 150 MG tablet Take 150 mg by mouth daily.    metoprolol tartrate (LOPRESSOR) 25 MG tablet TAKE 1/2 TABLET BY MOUTH TWICE A DAY   niacin (NIASPAN) 1000 MG CR tablet Take 1 tablet (1,000 mg total) by mouth at bedtime.   nitroGLYCERIN (NITROSTAT) 0.4 MG SL tablet Place 1 tablet (0.4 mg total) under the tongue every 5 (five) minutes as needed for chest pain.   PRALUENT 150 MG/ML SOAJ INJECT 1 PEN INTO THE SKIN EVERY 14 DAYS.   QUEtiapine (SEROQUEL) 200 MG tablet Take 125 mg by mouth at bedtime. Takes 1 1/4 tablet (125 mg)   ranolazine (RANEXA) 500 MG 12 hr tablet TAKE 1 TABLET BY MOUTH TWICE A DAY   Facility-Administered Encounter Medications as of 12/25/2022  Medication   0.9 %  sodium chloride infusion     REVIEW OF SYSTEMS  : All  other systems reviewed and negative except where noted in the History of Present Illness.   PHYSICAL EXAM: BP 130/80   Pulse 60   Ht 5\' 5"  (1.651 m)   Wt 201 lb (91.2 kg)   LMP 11/16/2011   BMI 33.45 kg/m  General: Well developed white female in no acute distress Head: Normocephalic and atraumatic Eyes:  Sclerae anicteric, conjunctiva pink. Ears: Normal auditory acuity Lungs: Clear throughout to auscultation; no W/R/R. Heart: Regular rate and rhythm; no M/R/G. Abdomen: Soft, non-distended.  BS present.  Generalized mild TTP. Musculoskeletal: Symmetrical with no gross deformities  Skin: No lesions on visible extremities Extremities: No edema  Neurological: Alert oriented x 4, grossly non-focal Psychological:  Alert and cooperative. Normal mood and affect  ASSESSMENT AND PLAN: *Dysphagia: Having recurrent dysphagia.  No cause for dysphagia seen on EGD in 2022.  Dysphagia is occurring a couple times a month.  Will check barium esophagram with tablet.  May be a motility issue.  No on any reflux medication because she does not feel any reflux symptoms. *Generalized abdominal discomfort and bloating/fullness: Was diagnosed with H. pylori at her EGD in 2022.  She took treatment, but never performed follow-up stool study.  She thinks that it is still the H. pylori that is causing her symptoms.  Will check H. pylori antigen.  She is not on PPI. *Intermittent right upper quadrant abdominal pains: She does have gallstones.  Question symptomatic cholelithiasis.  She says she was told previously that she would have evaluation for this.  Will refer to [facility]. *Chronic constipation: Well-controlled on Linzess 145 mcg daily.  Will send a prescription to pharmacy.   CC:  Macy Mis, MD

## 2022-12-25 NOTE — Patient Instructions (Signed)
Your provider has requested that you go to the basement level for lab work before leaving today. Press "B" on the elevator. The lab is located at the first door on the left as you exit the elevator.  Referral placed to [facility].   You have been scheduled for a Barium Esophogram at [facility] (1st floor of the hospital) on Thursday 01/04/23 at 10 am. Please arrive 30 minutes prior to your appointment for registration. Make certain not to have anything to eat or drink 3 hours prior to your test. If you need to reschedule for any reason, please contact radiology at [phone] to do so. __________________________________________________________________ A barium swallow is an examination that concentrates on views of the esophagus. This tends to be a double contrast exam (barium and two liquids which, when combined, create a gas to distend the wall of the oesophagus) or single contrast (non-ionic iodine based). The study is usually tailored to your symptoms so a good history is essential. Attention is paid during the study to the form, structure and configuration of the esophagus, looking for functional disorders (such as aspiration, dysphagia, achalasia, motility and reflux) EXAMINATION You may be asked to change into a gown, depending on the type of swallow being performed. A radiologist and radiographer will perform the procedure. The radiologist will advise you of the type of contrast selected for your procedure and direct you during the exam. You will be asked to stand, sit or lie in several different positions and to hold a small amount of fluid in your mouth before being asked to swallow while the imaging is performed .In some instances you may be asked to swallow barium coated marshmallows to assess the motility of a solid food bolus. The exam can be recorded as a digital or video fluoroscopy procedure. POST PROCEDURE It will take 1-2 days for the barium to pass through  your system. To facilitate this, it is important, unless otherwise directed, to increase your fluids for the next 24-48hrs and to resume your normal diet.  This test typically takes about 30 minutes to perform. _____________________________________________________________________________

## 2022-12-29 NOTE — Progress Notes (Signed)
Addendum: Reviewed and agree with assessment and management plan. Febe Champa M, MD  

## 2023-01-04 ENCOUNTER — Inpatient Hospital Stay (HOSPITAL_COMMUNITY): Admission: RE | Admit: 2023-01-04 | Payer: Medicare Other | Source: Ambulatory Visit

## 2023-01-12 ENCOUNTER — Ambulatory Visit: Payer: Medicare Other

## 2023-01-12 DIAGNOSIS — Z8619 Personal history of other infectious and parasitic diseases: Secondary | ICD-10-CM

## 2023-01-14 LAB — H. PYLORI ANTIGEN, STOOL: H pylori Ag, Stl: NEGATIVE

## 2023-01-23 ENCOUNTER — Ambulatory Visit (HOSPITAL_COMMUNITY)
Admission: RE | Admit: 2023-01-23 | Discharge: 2023-01-23 | Disposition: A | Discharge status: Home / Self Care | Payer: Medicare Other | Source: Ambulatory Visit | Attending: Gastroenterology | Admitting: Gastroenterology

## 2023-01-23 DIAGNOSIS — K224 Dyskinesia of esophagus: Secondary | ICD-10-CM | POA: Diagnosis not present

## 2023-01-23 DIAGNOSIS — K449 Diaphragmatic hernia without obstruction or gangrene: Secondary | ICD-10-CM | POA: Diagnosis not present

## 2023-01-23 DIAGNOSIS — R1310 Dysphagia, unspecified: Secondary | ICD-10-CM | POA: Diagnosis present

## 2023-01-23 DIAGNOSIS — Z951 Presence of aortocoronary bypass graft: Secondary | ICD-10-CM | POA: Insufficient documentation

## 2023-02-19 DIAGNOSIS — M25519 Pain in unspecified shoulder: Secondary | ICD-10-CM | POA: Insufficient documentation

## 2023-02-21 ENCOUNTER — Other Ambulatory Visit: Payer: Self-pay | Admitting: Cardiovascular Disease

## 2023-02-21 DIAGNOSIS — E785 Hyperlipidemia, unspecified: Secondary | ICD-10-CM

## 2023-02-21 DIAGNOSIS — I10 Essential (primary) hypertension: Secondary | ICD-10-CM

## 2023-02-26 ENCOUNTER — Telehealth: Payer: Self-pay | Admitting: Gastroenterology

## 2023-02-26 NOTE — Telephone Encounter (Signed)
Called [facility] and left message regarding referral and if they received it.

## 2023-02-26 NOTE — Telephone Encounter (Signed)
Inbound call from patient stating she has not heard anything for [facility] regarding gallbladder removal surgery. Requesting a call back. Please advise, thank you.

## 2023-02-26 NOTE — Telephone Encounter (Signed)
Spoke with [facility] and they stated they did not have a referral for the pt. Records faxed for referral for possible gallbladder surgery attn Cat to [phone].

## 2023-02-28 ENCOUNTER — Other Ambulatory Visit: Payer: Self-pay | Admitting: Family Medicine

## 2023-02-28 DIAGNOSIS — Z1231 Encounter for screening mammogram for malignant neoplasm of breast: Secondary | ICD-10-CM

## 2023-03-02 ENCOUNTER — Ambulatory Visit
Admission: RE | Admit: 2023-03-02 | Discharge: 2023-03-02 | Disposition: A | Discharge status: Home / Self Care | Payer: Medicare Other | Source: Ambulatory Visit | Attending: Family Medicine | Admitting: Family Medicine

## 2023-03-02 DIAGNOSIS — Z1231 Encounter for screening mammogram for malignant neoplasm of breast: Secondary | ICD-10-CM

## 2023-03-12 ENCOUNTER — Ambulatory Visit: Payer: Medicare Other | Admitting: Podiatry

## 2023-03-19 ENCOUNTER — Telehealth: Payer: Self-pay | Admitting: *Deleted

## 2023-03-19 NOTE — Telephone Encounter (Signed)
I tried to call the pt but vm is full, could not leave message to call to set up tele pre op appt.

## 2023-03-19 NOTE — Telephone Encounter (Signed)
Pt has been scheduled for tele pre op appt 03/29/23 @ 2 pm. Med rec and consent are done.     Patient Consent for Virtual Visit        [name] has provided verbal consent on 03/19/2023 for a virtual visit (video or telephone).   CONSENT FOR VIRTUAL VISIT FOR:  [name]  By participating in this virtual visit I agree to the following:  I hereby voluntarily request, consent and authorize [facility] and its employed or contracted physicians, physician assistants, nurse practitioners or other licensed health care professionals (the Practitioner), to provide me with telemedicine health care services (the "Services") as deemed necessary by the treating Practitioner. I acknowledge and consent to receive the Services by the Practitioner via telemedicine. I understand that the telemedicine visit will involve communicating with the Practitioner through live audiovisual communication technology and the disclosure of certain medical information by electronic transmission. I acknowledge that I have been given the opportunity to request an in-person assessment or other available alternative prior to the telemedicine visit and am voluntarily participating in the telemedicine visit.  I understand that I have the right to withhold or withdraw my consent to the use of telemedicine in the course of my care at any time, without affecting my right to future care or treatment, and that the Practitioner or I may terminate the telemedicine visit at any time. I understand that I have the right to inspect all information obtained and/or recorded in the course of the telemedicine visit and may receive copies of available information for a reasonable fee.  I understand that some of the potential risks of receiving the Services via telemedicine include:  Delay or interruption in medical evaluation due to technological equipment failure or disruption; Information transmitted may not be sufficient  (e.g. poor resolution of images) to allow for appropriate medical decision making by the Practitioner; and/or  In rare instances, security protocols could fail, causing a breach of personal health information.  Furthermore, I acknowledge that it is my responsibility to provide information about my medical history, conditions and care that is complete and accurate to the best of my ability. I acknowledge that Practitioner's advice, recommendations, and/or decision may be based on factors not within their control, such as incomplete or inaccurate data provided by me or distortions of diagnostic images or specimens that may result from electronic transmissions. I understand that the practice of medicine is not an exact science and that Practitioner makes no warranties or guarantees regarding treatment outcomes. I acknowledge that a copy of this consent can be made available to me via my patient portal [facility] MyChart), or I can request a printed copy by calling the office of [facility].    I understand that my insurance will be billed for this visit.   I have read or had this consent read to me. I understand the contents of this consent, which adequately explains the benefits and risks of the Services being provided via telemedicine.  I have been provided ample opportunity to ask questions regarding this consent and the Services and have had my questions answered to my satisfaction. I give my informed consent for the services to be provided through the use of telemedicine in my medical care

## 2023-03-19 NOTE — Telephone Encounter (Signed)
   Name: [name]  DOB: 21-Oct-1960  MRN: [id number]  Primary Cardiologist: Nanetta Batty, MD   Preoperative team, please contact this patient and set up a phone call appointment for further preoperative risk assessment. Please obtain consent and complete medication review. Thank you for your help.  I confirm that guidance regarding antiplatelet and oral anticoagulation therapy has been completed and, if necessary, noted below.   Her Plavix may be held for 5 days prior to her procedure.  Please resume as soon as hemostasis is achieved.   Napoleon Form, Leodis Rains, NP 03/19/2023, 8:58 AM [facility]

## 2023-03-19 NOTE — Telephone Encounter (Signed)
Pt has been scheduled for tele pre op appt 03/29/23 @ 2 pm. Med rec and consent are done.

## 2023-03-19 NOTE — Telephone Encounter (Signed)
   Pre-operative Risk Assessment    Patient Name: MCKAYLIN GOLIDAY  DOB: 20-Mar-1961 MRN: [id number]      Request for Surgical Clearance    Procedure:   LAPAROSCOPIC CHOLECYSTECTOMY   Date of Surgery:  Clearance TBD                                 Surgeon:  DR. Gaynelle Adu Surgeon's Group or Practice Name:  [facility] Phone number:  [phone] Fax number:  [phone] ATTN: Doristine Devoid, CMA   Type of Clearance Requested:   - Medical ; PLAVIX   Type of Anesthesia:  General    Additional requests/questions:    Elpidio Anis   03/19/2023, 8:52 AM

## 2023-03-26 ENCOUNTER — Ambulatory Visit (INDEPENDENT_AMBULATORY_CARE_PROVIDER_SITE_OTHER): Payer: Medicare Other | Admitting: Podiatry

## 2023-03-26 DIAGNOSIS — M79674 Pain in right toe(s): Secondary | ICD-10-CM

## 2023-03-26 DIAGNOSIS — M79675 Pain in left toe(s): Secondary | ICD-10-CM

## 2023-03-26 DIAGNOSIS — L600 Ingrowing nail: Secondary | ICD-10-CM

## 2023-03-26 DIAGNOSIS — B351 Tinea unguium: Secondary | ICD-10-CM

## 2023-03-26 NOTE — Progress Notes (Signed)
Subjective:  Patient ID: [name], female    DOB: 1960-10-07,  MRN: [id number]  [name] presents to clinic today for:  Chief Complaint  Patient presents with   Nail Problem    "My feet hurt all the time,  every time I cut my nails I bleed and at this point I want all of them removed"   PCP is Macy Mis, MD.  Allergies  Allergen Reactions   Benadryl [Diphenhydramine Hcl] Shortness Of Breath and Swelling    Tongue swelling   Codeine Other (See Comments)    "makes me psychotic"    Haldol [Haloperidol Decanoate] Anaphylaxis   Amoxicillin Hives and Swelling    Swelling to extremities Did it involve swelling of the face/tongue/throat, SOB, or low BP? Yes Did it involve sudden or severe rash/hives, skin peeling, or any reaction on the inside of your mouth or nose? Yes Did you need to seek medical attention at a hospital or doctor's office? Yes When did it last happen?    young adult   If all above answers are "NO", may proceed with cephalosporin use.   Bee Venom Swelling and Other (See Comments)    "pimples" from head to toe and whole body swelling    Review of Systems: Negative except as noted in the HPI.  Objective:  There were no vitals filed for this visit.  SUE-ANNE VANKLEY is a pleasant 62 y.o. female in NAD. AAO x 3.  Vascular Examination: Capillary refill time is 3-5 seconds to toes bilateral. Palpable pedal pulses b/l LE. Digital hair present b/l. No pedal edema b/l. Skin temperature gradient WNL b/l. No varicosities b/l. No cyanosis or clubbing noted b/l.   Dermatological Examination: There is incurvation, thickening, and abnormal morphology to the right third, fourth, and fifth toenails.  There is pain on palpation of the affected nails.  No clinical signs of bacterial infection or drainage are noted.  Neurological Examination: Protective sensation intact with Semmes-Weinstein 10 gram monofilament b/l LE.   Assessment/Plan: 1. Ingrown  toenail   2. Pain due to onychomycosis of toenails of both feet     Discussed patient's condition today.  After obtaining patient consent, the right third, fourth, and fifth toes were anesthetized with a 50:50 mixture of 1% lidocaine plain and 0.5% bupivacaine plain for a total of 3cc's administered to each toe.  Upon confirmation of anesthesia, a freer elevator was utilized to free the right third, fourth, and fifth nails from the nail bed.  The nails were then avulsed proximal to the eponychium and removed in toto.  The area was inspected for any remaining spicules.  A chemical matrixectomy was performed with phenol and neutralized with alcohol solution.  Antibiotic ointment and a DSD were applied, followed by a Coban dressing.  Patient tolerated the anesthetic and procedures well and will f/u in 2-3 weeks for recheck.  Patient given post-procedure instructions for Epsom salt soaks, antibiotic ointment and daily use of Bandaids until toe starts to dry / form eschar.   Return for 2-3 weeks for PNA recheck and also remove nails on left toes.   Clerance Lav, DPM, FACFAS [facility]     [address]                                        [place],  [place] [address]                Office [phone]  Fax [phone]

## 2023-03-26 NOTE — Patient Instructions (Signed)

## 2023-03-28 NOTE — Progress Notes (Unsigned)
Virtual Visit via Telephone Note   Because of [name]'s co-morbid illnesses, she is at least at moderate risk for complications without adequate follow up.  This format is felt to be most appropriate for this patient at this time.  The patient did not have access to video technology/had technical difficulties with video requiring transitioning to audio format only (telephone).  All issues noted in this document were discussed and addressed.  No physical exam could be performed with this format.  Please refer to the patient's chart for her consent to telehealth for [facility].  Evaluation Performed:  Preoperative cardiovascular risk assessment _____________   Date:  03/29/2023   Patient ID:  [name], DOB 1960/11/19, MRN [id number] Patient Location:  Home Provider location:   Office  Primary Care Provider:  Macy Mis, MD Primary Cardiologist:  Nanetta Batty, MD  Chief Complaint / Patient Profile   62 y.o. y/o female with a h/o OSA, coronary artery disease triple-vessel (status post RCA stenting, CABG November 2008 with LIMA to LAD, vein to ramus branch and PDA; severe's obstructive sleep apnea, repeat cardiac catheterization 03/05/2020 in the setting of NSTEMI revealing occluded RCA vein graft with left-to-right collaterals and normal LV systolic function.  Currently on multiple antianginal medications, and severe morbid obesity .  She is pending laparoscopic cholecystectomy by Dr. Gaynelle Adu, [facility] on date to be determined, with recommendations are requested concerning holding Plavix ,and presents today for telephonic preoperative cardiovascular risk assessment.  History of Present Illness    [name] is a 62 y.o. female who presents via audio/video conferencing for a telehealth visit today.  Pt was last seen in cardiology clinic on 10/10/2018 for by Dr. Carolanne Grumbling.  At that time [name] was being worked  up for OSA and CPAP titration. Intolerant to CPAP.  The patient is now pending procedure as outlined above. Since her last visit, she   Past Medical History    Past Medical History:  Diagnosis Date   Allergy    Anemia    takes iron supplement   Arthritis    knees, shoulders   Asthma    Bipolar 1 disorder (HCC)    Blood transfusion without reported diagnosis    Cataract, immature    Clotting disorder (HCC)    COPD (chronic obstructive pulmonary disease) (HCC)    Coronary artery disease    triple vessel   Family history of adverse reaction to anesthesia    states father "did not do well" with anesthesia; died last time he had surgery; also had heart disease   Gallstones    GERD (gastroesophageal reflux disease)    occasional - no current med.   Glaucoma    Hammertoe 05/2015   right second toe   Heart murmur    Hepatic steatosis    High cholesterol    History of MI (myocardial infarction)    Hypertension    states under control with meds., has been on med. x 8 yr.   Osteoporosis    Panic attacks    Peripheral vascular disease (HCC)    carotid   Renal cyst, left    Schizophrenia (HCC)    Short of breath on exertion    Sleep apnea    no CPAP use   Urinary, incontinence, stress female    Wears dentures    upper   Wears partial dentures    lower   Past Surgical History:  Procedure Laterality Date   C-SECTIONS X 2  1985, 1988   CARDIAC CATHETERIZATION  12/10/2006; 12/18/2006; 07/10/2007; 07/19/2009   CARDIAC CATHETERIZATION  03/04/2010   patent grafts, normal LV function, diffusely diseased RCA (Dr. Erlene Quan)   CARDIAC CATHETERIZATION N/A 02/15/2016   Procedure: Left Heart Cath and Cors/Grafts Angiography;  Surgeon: Peter M Swaziland, MD;  Location: [facility] INVASIVE CV LAB;  Service: Cardiovascular;  Laterality: N/A;   CLUB FOOT RELEASE Bilateral    1st and 2nd toes   COLONOSCOPY     CORONARY ARTERY BYPASS GRAFT  07/16/2007   LIMA to LAD, VG to ramus, VG to PDA    CORONARY  STENT INTERVENTION N/A 06/23/2019   Procedure: CORONARY STENT INTERVENTION;  Surgeon: Runell Gess, MD;  Location: [facility] INVASIVE CV LAB;  Service: Cardiovascular;  Laterality: N/A;   FLEXIBLE BRONCHOSCOPY  07/16/2007   HAMMERTOE RECONSTRUCTION WITH WEIL OSTEOTOMY Right 06/03/2015   Procedure: RIGHT SECOND HAMMERTOE CORRECTION WITH MT WEIL OSTEOTOMY;  Surgeon: Toni Arthurs, MD;  Location: [facility];  Service: Orthopedics;  Laterality: Right;   HAND SURGERY Bilateral    1st and 2nd fingers - release of clubbing   KNEE ARTHROSCOPY WITH ANTERIOR CRUCIATE LIGAMENT (ACL) REPAIR Right 01/30/2008   LEFT HEART CATH AND CORS/GRAFTS ANGIOGRAPHY N/A 06/23/2019   Procedure: LEFT HEART CATH AND CORS/GRAFTS ANGIOGRAPHY;  Surgeon: Runell Gess, MD;  Location: [facility] INVASIVE CV LAB;  Service: Cardiovascular;  Laterality: N/A;   LEFT HEART CATH AND CORS/GRAFTS ANGIOGRAPHY N/A 03/01/2020   Procedure: LEFT HEART CATH AND CORS/GRAFTS ANGIOGRAPHY;  Surgeon: Lyn Records, MD;  Location: [facility] INVASIVE CV LAB;  Service: Cardiovascular;  Laterality: N/A;   NOVASURE ABLATION  12/19/2007   with removal of IUD   ORIF FOOT FRACTURE Left    TOE SURGERIES TO REMOVE NAILS DUE TO CLUBBING     TOTAL KNEE ARTHROPLASTY Right 10/21/2012   Procedure: TOTAL KNEE ARTHROPLASTY;  Surgeon: Shelda Pal, MD;  Location: [facility];  Service: Orthopedics;  Laterality: Right;   UPPER GASTROINTESTINAL ENDOSCOPY      Allergies  Allergies  Allergen Reactions   Benadryl [Diphenhydramine Hcl] Shortness Of Breath and Swelling    Tongue swelling   Codeine Other (See Comments)    "makes me psychotic"    Haldol [Haloperidol Decanoate] Anaphylaxis   Amoxicillin Hives and Swelling    Swelling to extremities Did it involve swelling of the face/tongue/throat, SOB, or low BP? Yes Did it involve sudden or severe rash/hives, skin peeling, or any reaction on the inside of your mouth or nose? Yes Did you need to seek medical  attention at a hospital or doctor's office? Yes When did it last happen?    young adult   If all above answers are "NO", may proceed with cephalosporin use.   Bee Venom Swelling and Other (See Comments)    "pimples" from head to toe and whole body swelling    Home Medications    Prior to Admission medications   Medication Sig Start Date End Date Taking? Authorizing Provider  albuterol (PROVENTIL HFA;VENTOLIN HFA) 108 (90 BASE) MCG/ACT inhaler Inhale 2 puffs into the lungs every 6 (six) hours as needed for wheezing or shortness of breath.     [provider]  Alirocumab (PRALUENT) 150 MG/ML SOAJ INJECT 1 PEN INTO THE SKIN EVERY 14 DAYS. 02/21/23   Runell Gess, MD  amLODipine (NORVASC) 5 MG tablet Take 1 tablet (5 mg total) by mouth daily. 06/19/22   Allyson Sabal,  Delton See, MD  aspirin 81 MG chewable tablet Chew 81 mg by mouth daily.    [provider]  atorvastatin (LIPITOR) 80 MG tablet Take 1 tablet (80 mg total) by mouth daily. 08/11/22   Runell Gess, MD  brimonidine (ALPHAGAN) 0.15 % ophthalmic solution Place 1 drop into both eyes 2 (two) times daily.     [provider]  clopidogrel (PLAVIX) 75 MG tablet Take 1 tablet (75 mg total) by mouth daily. 08/11/22   Runell Gess, MD  escitalopram (LEXAPRO) 10 MG tablet Take 10 mg by mouth daily. 05/31/19   [provider]  fluticasone (FLONASE) 50 MCG/ACT nasal spray Place 1 spray into both nostrils daily. 06/13/19   [provider]  icosapent Ethyl (VASCEPA) 1 g capsule Take 2 capsules (2 g total) by mouth 2 (two) times daily. 08/11/22   Runell Gess, MD  isosorbide mononitrate (IMDUR) 30 MG 24 hr tablet TAKE 3 TABLETS BY MOUTH DAILY. 02/21/23   Runell Gess, MD  lamoTRIgine (LAMICTAL) 150 MG tablet Take 150 mg by mouth daily.  08/05/19   [provider]  linaclotide Karlene Einstein) 145 MCG CAPS capsule Take 1 capsule (145 mcg total) by mouth daily before breakfast. 12/25/22 12/20/23   Zehr, Shanda Bumps D, PA-C  metoprolol tartrate (LOPRESSOR) 25 MG tablet TAKE 1/2 TABLET BY MOUTH TWICE A DAY 10/20/22   Runell Gess, MD  niacin (NIASPAN) 1000 MG CR tablet TAKE 1 TABLET (1,000 MG TOTAL) BY MOUTH AT BEDTIME. 02/21/23   Runell Gess, MD  nitroGLYCERIN (NITROSTAT) 0.4 MG SL tablet Place 1 tablet (0.4 mg total) under the tongue every 5 (five) minutes as needed for chest pain. 07/07/21   Marjie Skiff E, PA-C  QUEtiapine (SEROQUEL) 200 MG tablet Take 125 mg by mouth at bedtime. Takes 1 1/4 tablet (125 mg) 05/31/19   [provider]  ranolazine (RANEXA) 500 MG 12 hr tablet TAKE 1 TABLET BY MOUTH TWICE A DAY 09/15/22   Runell Gess, MD    Physical Exam    Vital Signs:  KEAGAN TREZISE does not have vital signs available for review today.  Given telephonic nature of communication, physical exam is limited. AAOx3. NAD. Normal affect.  Speech and respirations are unlabored.  Accessory Clinical Findings    None  Assessment & Plan    1.  Preoperative Cardiovascular Risk Assessment:  According to the Revised Cardiac Risk Index (RCRI), her Perioperative Risk of Major Cardiac Event is (%): 0.4  Her Functional Capacity in METs is: 6.7 according to the [facility] Activity Status Index (DASI).   Per office protocol, if patient is without any new symptoms or concerns at the time of their virtual visit, he/she may hold Plavix for 5 days prior to procedure. She is to hold ASA 7 days prior to the procedure. Please resume Plavix and ASA as soon as possible postprocedure, at the discretion of the surgeon.    The patient was advised that if she develops new symptoms prior to surgery to contact our office to arrange for a follow-up visit, and she verbalized understanding.  Therefore, based on ACC/AHA guidelines, patient would be at acceptable risk for the planned procedure without further cardiovascular testing. I will route this recommendation to the requesting party via Epic  fax function.    Time:   Today, I have spent 10 minutes with the patient with telehealth technology discussing medical history, symptoms, and management plan.     [name], NP  03/29/2023, 2:06 PM

## 2023-03-29 ENCOUNTER — Ambulatory Visit: Payer: Medicare Other | Attending: Internal Medicine

## 2023-03-29 DIAGNOSIS — Z01818 Encounter for other preprocedural examination: Secondary | ICD-10-CM

## 2023-03-29 DIAGNOSIS — Z01810 Encounter for preprocedural cardiovascular examination: Secondary | ICD-10-CM

## 2023-04-16 ENCOUNTER — Ambulatory Visit (INDEPENDENT_AMBULATORY_CARE_PROVIDER_SITE_OTHER): Payer: Medicare Other | Admitting: Podiatry

## 2023-04-16 DIAGNOSIS — L03031 Cellulitis of right toe: Secondary | ICD-10-CM

## 2023-04-16 MED ORDER — CEPHALEXIN 500 MG PO CAPS: 500.0000 mg | ORAL_CAPSULE | Freq: Three times a day (TID) | ORAL | 0 refills | Status: AC

## 2023-04-16 NOTE — Progress Notes (Signed)
Chief Complaint  Patient presents with   Establish Care   HPI: 62 y.o. female presents today for follow-up of PNA to the complete nail of the right third, fourth, and fifth toenails.  She notes that she is not having much discomfort at all.  However she states of the third toe has been slightly tender and feels warm.  She thinks she might be getting an infection.  She was originally scheduled to have the toenails on the left taken off today with everything looked good.  Past Medical History:  Diagnosis Date   Allergy    Anemia    takes iron supplement   Arthritis    knees, shoulders   Asthma    Bipolar 1 disorder (HCC)    Blood transfusion without reported diagnosis    Cataract, immature    Clotting disorder (HCC)    COPD (chronic obstructive pulmonary disease) (HCC)    Coronary artery disease    triple vessel   Family history of adverse reaction to anesthesia    states father "did not do well" with anesthesia; died last time he had surgery; also had heart disease   Gallstones    GERD (gastroesophageal reflux disease)    occasional - no current med.   Glaucoma    Hammertoe 05/2015   right second toe   Heart murmur    Hepatic steatosis    High cholesterol    History of MI (myocardial infarction)    Hypertension    states under control with meds., has been on med. x 8 yr.   Osteoporosis    Panic attacks    Peripheral vascular disease (HCC)    carotid   Renal cyst, left    Schizophrenia (HCC)    Short of breath on exertion    Sleep apnea    no CPAP use   Urinary, incontinence, stress female    Wears dentures    upper   Wears partial dentures    lower    Past Surgical History:  Procedure Laterality Date   C-SECTIONS X 2  1985, 1988   CARDIAC CATHETERIZATION  12/10/2006; 12/18/2006; 07/10/2007; 07/19/2009   CARDIAC CATHETERIZATION  03/04/2010   patent grafts, normal LV function, diffusely diseased RCA (Dr. Erlene Quan)   CARDIAC CATHETERIZATION N/A 02/15/2016    Procedure: Left Heart Cath and Cors/Grafts Angiography;  Surgeon: Peter M Swaziland, MD;  Location: [facility] INVASIVE CV LAB;  Service: Cardiovascular;  Laterality: N/A;   CLUB FOOT RELEASE Bilateral    1st and 2nd toes   COLONOSCOPY     CORONARY ARTERY BYPASS GRAFT  07/16/2007   LIMA to LAD, VG to ramus, VG to PDA    CORONARY STENT INTERVENTION N/A 06/23/2019   Procedure: CORONARY STENT INTERVENTION;  Surgeon: Runell Gess, MD;  Location: [facility] INVASIVE CV LAB;  Service: Cardiovascular;  Laterality: N/A;   FLEXIBLE BRONCHOSCOPY  07/16/2007   HAMMERTOE RECONSTRUCTION WITH WEIL OSTEOTOMY Right 06/03/2015   Procedure: RIGHT SECOND HAMMERTOE CORRECTION WITH MT WEIL OSTEOTOMY;  Surgeon: Toni Arthurs, MD;  Location: [facility];  Service: Orthopedics;  Laterality: Right;   HAND SURGERY Bilateral    1st and 2nd fingers - release of clubbing   KNEE ARTHROSCOPY WITH ANTERIOR CRUCIATE LIGAMENT (ACL) REPAIR Right 01/30/2008   LEFT HEART CATH AND CORS/GRAFTS ANGIOGRAPHY N/A 06/23/2019   Procedure: LEFT HEART CATH AND CORS/GRAFTS ANGIOGRAPHY;  Surgeon: Runell Gess, MD;  Location: [facility] INVASIVE CV LAB;  Service: Cardiovascular;  Laterality: N/A;  LEFT HEART CATH AND CORS/GRAFTS ANGIOGRAPHY N/A 03/01/2020   Procedure: LEFT HEART CATH AND CORS/GRAFTS ANGIOGRAPHY;  Surgeon: Lyn Records, MD;  Location: [facility] INVASIVE CV LAB;  Service: Cardiovascular;  Laterality: N/A;   NOVASURE ABLATION  12/19/2007   with removal of IUD   ORIF FOOT FRACTURE Left    TOE SURGERIES TO REMOVE NAILS DUE TO CLUBBING     TOTAL KNEE ARTHROPLASTY Right 10/21/2012   Procedure: TOTAL KNEE ARTHROPLASTY;  Surgeon: Shelda Pal, MD;  Location: [facility];  Service: Orthopedics;  Laterality: Right;   UPPER GASTROINTESTINAL ENDOSCOPY      Allergies  Allergen Reactions   Benadryl [Diphenhydramine Hcl] Shortness Of Breath and Swelling    Tongue swelling   Codeine Other (See Comments)    "makes me psychotic"    Haldol  [Haloperidol Decanoate] Anaphylaxis   Amoxicillin Hives and Swelling    Swelling to extremities Did it involve swelling of the face/tongue/throat, SOB, or low BP? Yes Did it involve sudden or severe rash/hives, skin peeling, or any reaction on the inside of your mouth or nose? Yes Did you need to seek medical attention at a hospital or doctor's office? Yes When did it last happen?    young adult   If all above answers are "NO", may proceed with cephalosporin use.   Bee Venom Swelling and Other (See Comments)    "pimples" from head to toe and whole body swelling    Physical Exam: On exam there are pedal pulses on the right foot.  The right third toe has erythema and maceration present.  There is tenderness near the distal lateral aspect of the toe.  Minimal edema present to the third and fourth toes.  Fifth toe appears to be healing well  Assessment/Plan of Care: 1. Cellulitis of toe of right foot     Meds ordered this encounter  Medications   cephALEXin (KEFLEX) 500 MG capsule    Sig: Take 1 capsule (500 mg total) by mouth 3 (three) times daily for 10 days.    Dispense:  30 capsule    Refill:  0   Discussed clinical findings with patient today.  Due to the mild infection present, patient was informed that we will hold off on doing any nail removal on the left foot today.  Will send in a prescription for cephalexin 3 times daily x 10 days and reevaluate at that time.  If everything looks good on that day we will remove several nails on the left foot if she is able to avoid swimming pools and hot tubs for 1 week afterward.  Recheck in 2 weeks  Unnamed Zeien DBurna Mortimer, DPM, FACFAS [facility]     [address] [place], [place] [address]                Office [phone]  Fax [phone]

## 2023-04-30 ENCOUNTER — Telehealth: Payer: Self-pay | Admitting: Nurse Practitioner

## 2023-04-30 ENCOUNTER — Ambulatory Visit (INDEPENDENT_AMBULATORY_CARE_PROVIDER_SITE_OTHER): Payer: Medicare Other | Admitting: Nurse Practitioner

## 2023-04-30 ENCOUNTER — Encounter: Payer: Self-pay | Admitting: Nurse Practitioner

## 2023-04-30 VITALS — BP 132/80 | HR 62 | Ht 65.5 in | Wt 197.2 lb

## 2023-04-30 DIAGNOSIS — Z6832 Body mass index (BMI) 32.0-32.9, adult: Secondary | ICD-10-CM

## 2023-04-30 DIAGNOSIS — E6609 Other obesity due to excess calories: Secondary | ICD-10-CM

## 2023-04-30 DIAGNOSIS — K219 Gastro-esophageal reflux disease without esophagitis: Secondary | ICD-10-CM

## 2023-04-30 DIAGNOSIS — G4733 Obstructive sleep apnea (adult) (pediatric): Secondary | ICD-10-CM

## 2023-04-30 DIAGNOSIS — J439 Emphysema, unspecified: Secondary | ICD-10-CM

## 2023-04-30 DIAGNOSIS — J4489 Other specified chronic obstructive pulmonary disease: Secondary | ICD-10-CM | POA: Diagnosis not present

## 2023-04-30 DIAGNOSIS — F17200 Nicotine dependence, unspecified, uncomplicated: Secondary | ICD-10-CM

## 2023-04-30 MED ORDER — BREZTRI AEROSPHERE 160-9-4.8 MCG/ACT IN AERO: 2.0000 | INHALATION_SPRAY | Freq: Two times a day (BID) | RESPIRATORY_TRACT | 5 refills | Status: DC

## 2023-04-30 MED ORDER — PANTOPRAZOLE SODIUM 40 MG PO TBEC: 40.0000 mg | DELAYED_RELEASE_TABLET | Freq: Every day | ORAL | 5 refills | Status: DC

## 2023-04-30 NOTE — Patient Instructions (Addendum)
Continue Albuterol inhaler 2 puffs every 6 hours as needed for shortness of breath or wheezing. Notify if symptoms persist despite rescue inhaler/neb use.   Start Breztri 2 puffs Twice daily. Brush tongue and rinse mouth afterwards.  Pantoprazole (protonix) 1 tab daily in AM 30 minutes before breakfast  Someone from our office will contact you to schedule your drug induced sleep endoscopy then once we have completed this, I will send the referral to ENT for Inspire device workup.   Do not use your inhaler a few days before your pulmonary function testing   Follow up in 6-8 weeks after PFT to see how new inhaler is working with Dr. Wynona Neat or Dr. Vassie Loll (new pt 30 min slot). If symptoms do not improve or worsen, please contact office for sooner follow up or seek emergency care.

## 2023-04-30 NOTE — Progress Notes (Unsigned)
@Patient  ID: [name], female    DOB: May 02, 1961, 62 y.o.   MRN: [id number]  No chief complaint on file.   Referring provider: Macy Mis, MD  HPI:   TEST/EVENTS:   Allergies  Allergen Reactions   Benadryl [Diphenhydramine Hcl] Shortness Of Breath and Swelling    Tongue swelling   Codeine Other (See Comments)    "makes me psychotic"    Haldol [Haloperidol Decanoate] Anaphylaxis   Amoxicillin Hives and Swelling    Swelling to extremities Did it involve swelling of the face/tongue/throat, SOB, or low BP? Yes Did it involve sudden or severe rash/hives, skin peeling, or any reaction on the inside of your mouth or nose? Yes Did you need to seek medical attention at a hospital or doctor's office? Yes When did it last happen?    young adult   If all above answers are "NO", may proceed with cephalosporin use.   Bee Venom Swelling and Other (See Comments)    "pimples" from head to toe and whole body swelling    There is no immunization history for the selected administration types on file for this patient.  Past Medical History:  Diagnosis Date   Allergy    Anemia    takes iron supplement   Arthritis    knees, shoulders   Asthma    Bipolar 1 disorder (HCC)    Blood transfusion without reported diagnosis    Cataract, immature    Clotting disorder (HCC)    COPD (chronic obstructive pulmonary disease) (HCC)    Coronary artery disease    triple vessel   Family history of adverse reaction to anesthesia    states father "did not do well" with anesthesia; died last time he had surgery; also had heart disease   Gallstones    GERD (gastroesophageal reflux disease)    occasional - no current med.   Glaucoma    Hammertoe 05/2015   right second toe   Heart murmur    Hepatic steatosis    High cholesterol    History of MI (myocardial infarction)    Hypertension    states under control with meds., has been on med. x 8 yr.   Osteoporosis    Panic attacks     Peripheral vascular disease (HCC)    carotid   Renal cyst, left    Schizophrenia (HCC)    Short of breath on exertion    Sleep apnea    no CPAP use   Urinary, incontinence, stress female    Wears dentures    upper   Wears partial dentures    lower    Tobacco History: Social History   Tobacco Use  Smoking Status Some Days   Current packs/day: 0.00   Types: Cigarettes   Last attempt to quit: 05/21/2015   Years since quitting: 7.9  Smokeless Tobacco Never  Tobacco Comments   pt states she quit a week ago 06/14/19   Ready to quit: Not Answered Counseling given: Not Answered Tobacco comments: pt states she quit a week ago 06/14/19   Outpatient Medications Prior to Visit  Medication Sig Dispense Refill   albuterol (PROVENTIL HFA;VENTOLIN HFA) 108 (90 BASE) MCG/ACT inhaler Inhale 2 puffs into the lungs every 6 (six) hours as needed for wheezing or shortness of breath.      Alirocumab (PRALUENT) 150 MG/ML SOAJ INJECT 1 PEN INTO THE SKIN EVERY 14 DAYS. 6 mL 3   amLODipine (NORVASC) 5 MG tablet Take 1 tablet (5  mg total) by mouth daily. 90 tablet 3   aspirin 81 MG chewable tablet Chew 81 mg by mouth daily.     atorvastatin (LIPITOR) 80 MG tablet Take 1 tablet (80 mg total) by mouth daily. 90 tablet 1   brimonidine (ALPHAGAN) 0.15 % ophthalmic solution Place 1 drop into both eyes 2 (two) times daily.      clopidogrel (PLAVIX) 75 MG tablet Take 1 tablet (75 mg total) by mouth daily. 90 tablet 1   escitalopram (LEXAPRO) 10 MG tablet Take 10 mg by mouth daily.     fluticasone (FLONASE) 50 MCG/ACT nasal spray Place 1 spray into both nostrils daily.     icosapent Ethyl (VASCEPA) 1 g capsule Take 2 capsules (2 g total) by mouth 2 (two) times daily. 120 capsule 2   isosorbide mononitrate (IMDUR) 30 MG 24 hr tablet TAKE 3 TABLETS BY MOUTH DAILY. 270 tablet 3   lamoTRIgine (LAMICTAL) 150 MG tablet Take 150 mg by mouth daily.      linaclotide (LINZESS) 145 MCG CAPS capsule Take 1 capsule (145  mcg total) by mouth daily before breakfast. 90 capsule 3   metoprolol tartrate (LOPRESSOR) 25 MG tablet TAKE 1/2 TABLET BY MOUTH TWICE A DAY 90 tablet 3   niacin (NIASPAN) 1000 MG CR tablet TAKE 1 TABLET (1,000 MG TOTAL) BY MOUTH AT BEDTIME. 90 tablet 3   nitroGLYCERIN (NITROSTAT) 0.4 MG SL tablet Place 1 tablet (0.4 mg total) under the tongue every 5 (five) minutes as needed for chest pain. 25 tablet 3   QUEtiapine (SEROQUEL) 200 MG tablet Take 125 mg by mouth at bedtime. Takes 1 1/4 tablet (125 mg)     ranolazine (RANEXA) 500 MG 12 hr tablet TAKE 1 TABLET BY MOUTH TWICE A DAY 180 tablet 2   Facility-Administered Medications Prior to Visit  Medication Dose Route Frequency Provider Last Rate Last Admin   0.9 %  sodium chloride infusion  500 mL Intravenous Once Armbruster, Willaim Rayas, MD         Review of Systems:   Constitutional: No weight loss or gain, night sweats, fevers, chills, fatigue, or lassitude. HEENT: No headaches, difficulty swallowing, tooth/dental problems, or sore throat. No sneezing, itching, ear ache, nasal congestion, or post nasal drip CV:  No chest pain, orthopnea, PND, swelling in lower extremities, anasarca, dizziness, palpitations, syncope Resp: No shortness of breath with exertion or at rest. No excess mucus or change in color of mucus. No productive or non-productive. No hemoptysis. No wheezing.  No chest wall deformity GI:  No heartburn, indigestion, abdominal pain, nausea, vomiting, diarrhea, change in bowel habits, loss of appetite, bloody stools.  GU: No dysuria, change in color of urine, urgency or frequency.  No flank pain, no hematuria  Skin: No rash, lesions, ulcerations MSK:  No joint pain or swelling.  No decreased range of motion.  No back pain. Neuro: No dizziness or lightheadedness.  Psych: No depression or anxiety. Mood stable.     Physical Exam:  LMP 11/16/2011   GEN: Pleasant, interactive, well-nourished/chronically-ill appearing/acutely-ill  appearing/poorly-nourished/morbidly obese; in no acute distress.****** HEENT:  Normocephalic and atraumatic. EACs patent bilaterally. TM pearly gray with present light reflex bilaterally. PERRLA. Sclera white. Nasal turbinates pink, moist and patent bilaterally. No rhinorrhea present. Oropharynx pink and moist, without exudate or edema. No lesions, ulcerations, or postnasal drip.  NECK:  Supple w/ fair ROM. No JVD present. Normal carotid impulses w/o bruits. Thyroid symmetrical with no goiter or nodules palpated. No lymphadenopathy.  CV: RRR, no m/r/g, no peripheral edema. Pulses intact, +2 bilaterally. No cyanosis, pallor or clubbing. PULMONARY:  Unlabored, regular breathing. Clear bilaterally A&P w/o wheezes/rales/rhonchi. No accessory muscle use.  GI: BS present and normoactive. Soft, non-tender to palpation. No organomegaly or masses detected. No CVA tenderness. MSK: No erythema, warmth or tenderness. Cap refil <2 sec all extrem. No deformities or joint swelling noted.  Neuro: A/Ox3. No focal deficits noted.   Skin: Warm, no lesions or rashe Psych: Normal affect and behavior. Judgement and thought content appropriate.     Lab Results:  CBC    Component Value Date/Time   WBC 15.0 (H) 11/26/2020 1926   RBC 4.49 11/26/2020 1926   HGB 12.3 11/26/2020 1926   HCT 38.8 11/26/2020 1926   PLT 304 11/26/2020 1926   MCV 86.4 11/26/2020 1926   MCH 27.4 11/26/2020 1926   MCHC 31.7 11/26/2020 1926   RDW 19.0 (H) 11/26/2020 1926   LYMPHSABS 2.5 11/26/2020 1926   MONOABS 0.5 11/26/2020 1926   EOSABS 0.0 11/26/2020 1926   BASOSABS 0.1 11/26/2020 1926    BMET    Component Value Date/Time   NA 139 11/26/2020 1926   K 3.5 11/26/2020 1926   CL 102 11/26/2020 1926   CO2 24 11/26/2020 1926   GLUCOSE 211 (H) 11/26/2020 1926   BUN 14 11/26/2020 1926   CREATININE 1.11 (H) 11/26/2020 1926   CREATININE 1.10 (H) 02/11/2016 0921   CALCIUM 9.9 11/26/2020 1926   GFRNONAA 58 (L) 11/26/2020 1926    GFRAA >60 03/07/2020 0313    BNP No results found for: "BNP"   Imaging:  No results found.  Administration History     None           No data to display          No results found for: "NITRICOXIDE"      Assessment & Plan:   No problem-specific Assessment & Plan notes found for this encounter.   I spent *** minutes of dedicated to the care of this patient on the date of this encounter to include pre-visit review of records, face-to-face time with the patient discussing conditions above, post visit ordering of testing, clinical documentation with the electronic health record, making appropriate referrals as documented, and communicating necessary findings to members of the patients care team.  Noemi Chapel, NP 04/30/2023  Pt aware and understands NP's role.

## 2023-04-30 NOTE — H&P (View-Only) (Signed)
@Patient  ID: [name], female    DOB: May 02, 1961, 62 y.o.   MRN: [id number]  No chief complaint on file.   Referring provider: Macy Mis, MD  HPI:   TEST/EVENTS:   Allergies  Allergen Reactions   Benadryl [Diphenhydramine Hcl] Shortness Of Breath and Swelling    Tongue swelling   Codeine Other (See Comments)    "makes me psychotic"    Haldol [Haloperidol Decanoate] Anaphylaxis   Amoxicillin Hives and Swelling    Swelling to extremities Did it involve swelling of the face/tongue/throat, SOB, or low BP? Yes Did it involve sudden or severe rash/hives, skin peeling, or any reaction on the inside of your mouth or nose? Yes Did you need to seek medical attention at a hospital or doctor's office? Yes When did it last happen?    young adult   If all above answers are "NO", may proceed with cephalosporin use.   Bee Venom Swelling and Other (See Comments)    "pimples" from head to toe and whole body swelling    There is no immunization history for the selected administration types on file for this patient.  Past Medical History:  Diagnosis Date   Allergy    Anemia    takes iron supplement   Arthritis    knees, shoulders   Asthma    Bipolar 1 disorder (HCC)    Blood transfusion without reported diagnosis    Cataract, immature    Clotting disorder (HCC)    COPD (chronic obstructive pulmonary disease) (HCC)    Coronary artery disease    triple vessel   Family history of adverse reaction to anesthesia    states father "did not do well" with anesthesia; died last time he had surgery; also had heart disease   Gallstones    GERD (gastroesophageal reflux disease)    occasional - no current med.   Glaucoma    Hammertoe 05/2015   right second toe   Heart murmur    Hepatic steatosis    High cholesterol    History of MI (myocardial infarction)    Hypertension    states under control with meds., has been on med. x 8 yr.   Osteoporosis    Panic attacks     Peripheral vascular disease (HCC)    carotid   Renal cyst, left    Schizophrenia (HCC)    Short of breath on exertion    Sleep apnea    no CPAP use   Urinary, incontinence, stress female    Wears dentures    upper   Wears partial dentures    lower    Tobacco History: Social History   Tobacco Use  Smoking Status Some Days   Current packs/day: 0.00   Types: Cigarettes   Last attempt to quit: 05/21/2015   Years since quitting: 7.9  Smokeless Tobacco Never  Tobacco Comments   pt states she quit a week ago 06/14/19   Ready to quit: Not Answered Counseling given: Not Answered Tobacco comments: pt states she quit a week ago 06/14/19   Outpatient Medications Prior to Visit  Medication Sig Dispense Refill   albuterol (PROVENTIL HFA;VENTOLIN HFA) 108 (90 BASE) MCG/ACT inhaler Inhale 2 puffs into the lungs every 6 (six) hours as needed for wheezing or shortness of breath.      Alirocumab (PRALUENT) 150 MG/ML SOAJ INJECT 1 PEN INTO THE SKIN EVERY 14 DAYS. 6 mL 3   amLODipine (NORVASC) 5 MG tablet Take 1 tablet (5  mg total) by mouth daily. 90 tablet 3   aspirin 81 MG chewable tablet Chew 81 mg by mouth daily.     atorvastatin (LIPITOR) 80 MG tablet Take 1 tablet (80 mg total) by mouth daily. 90 tablet 1   brimonidine (ALPHAGAN) 0.15 % ophthalmic solution Place 1 drop into both eyes 2 (two) times daily.      clopidogrel (PLAVIX) 75 MG tablet Take 1 tablet (75 mg total) by mouth daily. 90 tablet 1   escitalopram (LEXAPRO) 10 MG tablet Take 10 mg by mouth daily.     fluticasone (FLONASE) 50 MCG/ACT nasal spray Place 1 spray into both nostrils daily.     icosapent Ethyl (VASCEPA) 1 g capsule Take 2 capsules (2 g total) by mouth 2 (two) times daily. 120 capsule 2   isosorbide mononitrate (IMDUR) 30 MG 24 hr tablet TAKE 3 TABLETS BY MOUTH DAILY. 270 tablet 3   lamoTRIgine (LAMICTAL) 150 MG tablet Take 150 mg by mouth daily.      linaclotide (LINZESS) 145 MCG CAPS capsule Take 1 capsule (145  mcg total) by mouth daily before breakfast. 90 capsule 3   metoprolol tartrate (LOPRESSOR) 25 MG tablet TAKE 1/2 TABLET BY MOUTH TWICE A DAY 90 tablet 3   niacin (NIASPAN) 1000 MG CR tablet TAKE 1 TABLET (1,000 MG TOTAL) BY MOUTH AT BEDTIME. 90 tablet 3   nitroGLYCERIN (NITROSTAT) 0.4 MG SL tablet Place 1 tablet (0.4 mg total) under the tongue every 5 (five) minutes as needed for chest pain. 25 tablet 3   QUEtiapine (SEROQUEL) 200 MG tablet Take 125 mg by mouth at bedtime. Takes 1 1/4 tablet (125 mg)     ranolazine (RANEXA) 500 MG 12 hr tablet TAKE 1 TABLET BY MOUTH TWICE A DAY 180 tablet 2   Facility-Administered Medications Prior to Visit  Medication Dose Route Frequency Provider Last Rate Last Admin   0.9 %  sodium chloride infusion  500 mL Intravenous Once Armbruster, Willaim Rayas, MD         Review of Systems:   Constitutional: No weight loss or gain, night sweats, fevers, chills, fatigue, or lassitude. HEENT: No headaches, difficulty swallowing, tooth/dental problems, or sore throat. No sneezing, itching, ear ache, nasal congestion, or post nasal drip CV:  No chest pain, orthopnea, PND, swelling in lower extremities, anasarca, dizziness, palpitations, syncope Resp: No shortness of breath with exertion or at rest. No excess mucus or change in color of mucus. No productive or non-productive. No hemoptysis. No wheezing.  No chest wall deformity GI:  No heartburn, indigestion, abdominal pain, nausea, vomiting, diarrhea, change in bowel habits, loss of appetite, bloody stools.  GU: No dysuria, change in color of urine, urgency or frequency.  No flank pain, no hematuria  Skin: No rash, lesions, ulcerations MSK:  No joint pain or swelling.  No decreased range of motion.  No back pain. Neuro: No dizziness or lightheadedness.  Psych: No depression or anxiety. Mood stable.     Physical Exam:  LMP 11/16/2011   GEN: Pleasant, interactive, well-nourished/chronically-ill appearing/acutely-ill  appearing/poorly-nourished/morbidly obese; in no acute distress.****** HEENT:  Normocephalic and atraumatic. EACs patent bilaterally. TM pearly gray with present light reflex bilaterally. PERRLA. Sclera white. Nasal turbinates pink, moist and patent bilaterally. No rhinorrhea present. Oropharynx pink and moist, without exudate or edema. No lesions, ulcerations, or postnasal drip.  NECK:  Supple w/ fair ROM. No JVD present. Normal carotid impulses w/o bruits. Thyroid symmetrical with no goiter or nodules palpated. No lymphadenopathy.  CV: RRR, no m/r/g, no peripheral edema. Pulses intact, +2 bilaterally. No cyanosis, pallor or clubbing. PULMONARY:  Unlabored, regular breathing. Clear bilaterally A&P w/o wheezes/rales/rhonchi. No accessory muscle use.  GI: BS present and normoactive. Soft, non-tender to palpation. No organomegaly or masses detected. No CVA tenderness. MSK: No erythema, warmth or tenderness. Cap refil <2 sec all extrem. No deformities or joint swelling noted.  Neuro: A/Ox3. No focal deficits noted.   Skin: Warm, no lesions or rashe Psych: Normal affect and behavior. Judgement and thought content appropriate.     Lab Results:  CBC    Component Value Date/Time   WBC 15.0 (H) 11/26/2020 1926   RBC 4.49 11/26/2020 1926   HGB 12.3 11/26/2020 1926   HCT 38.8 11/26/2020 1926   PLT 304 11/26/2020 1926   MCV 86.4 11/26/2020 1926   MCH 27.4 11/26/2020 1926   MCHC 31.7 11/26/2020 1926   RDW 19.0 (H) 11/26/2020 1926   LYMPHSABS 2.5 11/26/2020 1926   MONOABS 0.5 11/26/2020 1926   EOSABS 0.0 11/26/2020 1926   BASOSABS 0.1 11/26/2020 1926    BMET    Component Value Date/Time   NA 139 11/26/2020 1926   K 3.5 11/26/2020 1926   CL 102 11/26/2020 1926   CO2 24 11/26/2020 1926   GLUCOSE 211 (H) 11/26/2020 1926   BUN 14 11/26/2020 1926   CREATININE 1.11 (H) 11/26/2020 1926   CREATININE 1.10 (H) 02/11/2016 0921   CALCIUM 9.9 11/26/2020 1926   GFRNONAA 58 (L) 11/26/2020 1926    GFRAA >60 03/07/2020 0313    BNP No results found for: "BNP"   Imaging:  No results found.  Administration History     None           No data to display          No results found for: "NITRICOXIDE"      Assessment & Plan:   No problem-specific Assessment & Plan notes found for this encounter.   I spent *** minutes of dedicated to the care of this patient on the date of this encounter to include pre-visit review of records, face-to-face time with the patient discussing conditions above, post visit ordering of testing, clinical documentation with the electronic health record, making appropriate referrals as documented, and communicating necessary findings to members of the patients care team.  Noemi Chapel, NP 04/30/2023  Pt aware and understands NP's role.

## 2023-05-01 NOTE — Telephone Encounter (Signed)
P[facility]s please schedule pt for DISE procedure with Dr. Vassie Loll. Referral to pulmonary has been placed. Thanks!

## 2023-05-02 ENCOUNTER — Encounter: Payer: Self-pay | Admitting: Nurse Practitioner

## 2023-05-02 DIAGNOSIS — F17200 Nicotine dependence, unspecified, uncomplicated: Secondary | ICD-10-CM | POA: Insufficient documentation

## 2023-05-02 DIAGNOSIS — G4733 Obstructive sleep apnea (adult) (pediatric): Secondary | ICD-10-CM | POA: Insufficient documentation

## 2023-05-02 DIAGNOSIS — K219 Gastro-esophageal reflux disease without esophagitis: Secondary | ICD-10-CM | POA: Insufficient documentation

## 2023-05-02 DIAGNOSIS — J4489 Other specified chronic obstructive pulmonary disease: Secondary | ICD-10-CM | POA: Insufficient documentation

## 2023-05-02 NOTE — Assessment & Plan Note (Addendum)
Moderate obstruction on previous testing. She has not had formal PFTs. Has daily symptoms of dyspnea and productive cough. Will trial her on triple therapy regimen with Breztri. Provided with samples today and new rx sent. Medication education and teachback performed. Smoking cessation strongly advised. Action plan in place. Will set up for formal PFTs for further evaluation.   Patient Instructions  Continue Albuterol inhaler 2 puffs every 6 hours as needed for shortness of breath or wheezing. Notify if symptoms persist despite rescue inhaler/neb use.   Start Breztri 2 puffs Twice daily. Brush tongue and rinse mouth afterwards.  Pantoprazole (protonix) 1 tab daily in AM 30 minutes before breakfast  Someone from our office will contact you to schedule your drug induced sleep endoscopy then once we have completed this, I will send the referral to ENT for Inspire device workup.   Do not use your inhaler a few days before your pulmonary function testing   Follow up in 6-8 weeks after PFT to see how new inhaler is working with Dr. Wynona Neat or Dr. Vassie Loll (new pt 30 min slot). If symptoms do not improve or worsen, please contact office for sooner follow up or seek emergency care.

## 2023-05-02 NOTE — Assessment & Plan Note (Signed)
Start PPI therapy. Medication education and side effect profile reviewed. GERD precautions. Follow up with GI at upcoming appt.

## 2023-05-02 NOTE — Assessment & Plan Note (Signed)
BMI 32. Healthy weight loss encouraged.

## 2023-05-02 NOTE — Assessment & Plan Note (Signed)
Severe OSA intolerant to CPAP despite multiple desensitization studies. Most recent sleep study 2022-11-03 with AHI 33/h. Reviewed risks of untreated OSA. She would like to be evaluated for hypoglossal nerve stimulator for management of severe OSA. Will set her up for drug induced sleep endoscopy with Dr. Vassie Loll. If she is deemed a candidate for Inspire based on this, will send referral to ENT for placement. Aware of safe driving practices.

## 2023-05-02 NOTE — Assessment & Plan Note (Signed)
Active some day smoker. Smoking cessation advised.

## 2023-05-03 NOTE — Telephone Encounter (Signed)
Pt has been scheduled for 05/17/23 @ 7:30 AM & pt has been given appt info.  Created letter as well.  Notified Chris with [employer].

## 2023-05-16 NOTE — Anesthesia Preprocedure Evaluation (Signed)
Anesthesia Evaluation  Patient identified by MRN, date of birth, ID band Patient awake    Reviewed: Allergy & Precautions, NPO status , Patient's Chart, lab work & pertinent test results, reviewed documented beta blocker date and time   History of Anesthesia Complications Negative for: history of anesthetic complications  Airway Mallampati: II  TM Distance: >3 FB Neck ROM: Full    Dental  (+) Dental Advisory Given, Upper Dentures, Partial Lower   Pulmonary asthma , sleep apnea , COPD,  COPD inhaler, Current Smoker and Patient abstained from smoking.   Pulmonary exam normal        Cardiovascular hypertension, Pt. on medications and Pt. 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 EF preserved 62%    Neuro/Psych  PSYCHIATRIC DISORDERS Anxiety  Bipolar Disorder Schizophrenia  negative neurological ROS     GI/Hepatic Neg liver ROS,GERD  Medicated and Controlled,,  Endo/Other   Obesity   Renal/GU negative Renal ROS  Female GU complaint     Musculoskeletal  (+) Arthritis ,    Abdominal   Peds  Hematology  Clotting d/o    Anesthesia Other Findings   Reproductive/Obstetrics                              Anesthesia Physical Anesthesia Plan  ASA: 3  Anesthesia Plan: MAC   Post-op Pain Management: Minimal or no pain anticipated   Induction:   PONV Risk Score and Plan: 1 and Propofol infusion and Treatment may vary due to age or medical condition  Airway Management Planned: Nasal Cannula and Natural Airway  Additional Equipment: None  Intra-op Plan:   Post-operative Plan:   Informed Consent: I have reviewed the patients History and Physical, chart, labs and discussed the procedure including the risks, benefits and alternatives for the  proposed anesthesia with the patient or authorized representative who has indicated his/her understanding and acceptance.       Plan Discussed with: CRNA and Anesthesiologist  Anesthesia Plan Comments:         Anesthesia Quick Evaluation

## 2023-05-17 ENCOUNTER — Ambulatory Visit (HOSPITAL_COMMUNITY): Payer: Self-pay | Admitting: Anesthesiology

## 2023-05-17 ENCOUNTER — Other Ambulatory Visit: Payer: Self-pay

## 2023-05-17 ENCOUNTER — Ambulatory Visit (HOSPITAL_BASED_OUTPATIENT_CLINIC_OR_DEPARTMENT_OTHER): Payer: Medicare Other | Admitting: Anesthesiology

## 2023-05-17 ENCOUNTER — Ambulatory Visit (HOSPITAL_COMMUNITY)
Admission: RE | Admit: 2023-05-17 | Discharge: 2023-05-17 | Disposition: A | Discharge status: Home / Self Care | Payer: Medicare Other | Attending: Pulmonary Disease | Admitting: Pulmonary Disease

## 2023-05-17 ENCOUNTER — Encounter (HOSPITAL_COMMUNITY)
Admission: RE | Disposition: A | Discharge status: Home / Self Care | Payer: Self-pay | Source: Home / Self Care | Attending: Pulmonary Disease

## 2023-05-17 ENCOUNTER — Encounter (HOSPITAL_COMMUNITY): Payer: Self-pay | Admitting: Pulmonary Disease

## 2023-05-17 DIAGNOSIS — F17210 Nicotine dependence, cigarettes, uncomplicated: Secondary | ICD-10-CM | POA: Insufficient documentation

## 2023-05-17 DIAGNOSIS — J4489 Other specified chronic obstructive pulmonary disease: Secondary | ICD-10-CM | POA: Insufficient documentation

## 2023-05-17 DIAGNOSIS — G4733 Obstructive sleep apnea (adult) (pediatric): Secondary | ICD-10-CM | POA: Insufficient documentation

## 2023-05-17 DIAGNOSIS — I2510 Atherosclerotic heart disease of native coronary artery without angina pectoris: Secondary | ICD-10-CM | POA: Insufficient documentation

## 2023-05-17 DIAGNOSIS — I10 Essential (primary) hypertension: Secondary | ICD-10-CM | POA: Diagnosis not present

## 2023-05-17 DIAGNOSIS — K219 Gastro-esophageal reflux disease without esophagitis: Secondary | ICD-10-CM | POA: Insufficient documentation

## 2023-05-17 DIAGNOSIS — E669 Obesity, unspecified: Secondary | ICD-10-CM | POA: Insufficient documentation

## 2023-05-17 DIAGNOSIS — Z951 Presence of aortocoronary bypass graft: Secondary | ICD-10-CM | POA: Insufficient documentation

## 2023-05-17 DIAGNOSIS — Z6832 Body mass index (BMI) 32.0-32.9, adult: Secondary | ICD-10-CM | POA: Insufficient documentation

## 2023-05-17 DIAGNOSIS — I739 Peripheral vascular disease, unspecified: Secondary | ICD-10-CM | POA: Insufficient documentation

## 2023-05-17 DIAGNOSIS — Z955 Presence of coronary angioplasty implant and graft: Secondary | ICD-10-CM | POA: Diagnosis not present

## 2023-05-17 DIAGNOSIS — I252 Old myocardial infarction: Secondary | ICD-10-CM | POA: Diagnosis not present

## 2023-05-17 HISTORY — PX: DRUG INDUCED ENDOSCOPY: SHX6808

## 2023-05-17 SURGERY — DRUG INDUCED SLEEP ENDOSCOPY
Anesthesia: Monitor Anesthesia Care

## 2023-05-17 MED ORDER — PROPOFOL 10 MG/ML IV BOLUS
INTRAVENOUS | Status: DC | PRN
Start: 2023-05-17 — End: 2023-05-17
  Administered 2023-05-17 (×3): 10 mg via INTRAVENOUS

## 2023-05-17 MED ORDER — ONDANSETRON HCL 4 MG/2ML IJ SOLN
INTRAMUSCULAR | Status: DC | PRN
  Administered 2023-05-17: 4 mg via INTRAVENOUS

## 2023-05-17 MED ORDER — LACTATED RINGERS IV SOLN: INTRAVENOUS | Status: DC

## 2023-05-17 MED ORDER — PROPOFOL 500 MG/50ML IV EMUL
INTRAVENOUS | Status: DC | PRN
  Administered 2023-05-17: 150 ug/kg/min via INTRAVENOUS

## 2023-05-17 MED ORDER — OXYMETAZOLINE HCL 0.05 % NA SOLN
NASAL | Status: AC
  Filled 2023-05-17: qty 30

## 2023-05-17 MED ORDER — LIDOCAINE 2% (20 MG/ML) 5 ML SYRINGE
INTRAMUSCULAR | Status: DC | PRN
  Administered 2023-05-17: 20 mg via INTRAVENOUS

## 2023-05-17 MED ORDER — OXYMETAZOLINE HCL 0.05 % NA SOLN
NASAL | Status: DC | PRN
  Administered 2023-05-17: 1

## 2023-05-17 NOTE — Transfer of Care (Signed)
Immediate Anesthesia Transfer of Care Note  Patient: [name]  Procedure(s) Performed: DRUG INDUCED ENDOSCOPY  Patient Location: PACU  Anesthesia Type:MAC  Level of Consciousness: awake, alert , oriented, patient cooperative, and responds to stimulation  Airway & Oxygen Therapy: Patient Spontanous Breathing and Patient connected to nasal cannula oxygen  Post-op Assessment: Report given to RN and Post -op Vital signs reviewed and stable  Post vital signs: Reviewed and stable  Last Vitals:  Vitals Value Taken Time  BP 128/77 (91) 05/17/23 0808    Temp 97.49F 05/17/23 0808    Pulse 56 05/17/23 0808  Resp 19 05/17/23 0808  SpO2 95 % 05/17/23 0808  Vitals shown include unfiled device data.     Complications: No notable events documented.

## 2023-05-17 NOTE — Op Note (Signed)
Procedure: Evaluation of sleep-disordered breathing by examination of upper airway using an endoscope  CPT Codes: 16109 Evaluation of sleep-disordered breathing by examination of upper airway using an endoscope  Pre-Op Diagnose: Moderate /Severe obstructive sleep apnea with positive airway pressure intolerance (ICD-10 G47.33).  Post-Op Diagnosis: Moderate /Severe obstructive sleep apnea with positive pressure airway intolerance (ICD-10 G47.33).  ANESTHESIA: IV propofol.  ESTIMATED BLOOD LOSS: None.  COMPLICATIONS: None.  BRIEF CLINICAL HISTORY: This is a 62 year old patient with a history of  moderate to severe symptomatic obstructive sleep apnea, who is intolerant  and unable to achieve benefit with positive pressure therapy.She presents today for drug-induced sleep endoscopy to better characterize her locations and pattern of obstruction and to predict appropriate medical and/or surgical options moving forward.  PROCEDURE FINDINGS: There was no evidence of complete concentric palatal obstruction and she is a candidate anatomically for hypoglossal nerve stimulation therapy.  DESCRIPTION OF PROCEDURE: The patient was brought to the endo room  and was anesthetized via the standard drug-induced sleep endoscopy protocol. The propofol infusion rate was started at 75 mcg and gradually increased to a level of 125  mcg, at which point, conditions that mimic sleep were gradually observed.   With the patient not responsive to verbal commands, but still with spontaneous respiration, sleep disordered breathing events and associated desaturations were clearly observed  Under these conditions, the flexible endoscope was inserted to examine both sides of the nose as well as the pharynx and larynx.  The VOTE score at baseline was partial AP , partial AP , complete AP, complete AP. With simulated jaw advancement and tongue advancement, the hypopharyngeal  obstruction and secondarily the palatal  collapse also improved.There was 25% lateral wall collapse, in addition.  In summary, there was no evidence of complete concentric palatal obstruction and she is a candidate anatomically for hypoglossal nerve stimulation therapy.  I was present for and performed the entire procedure.  Dictated By:  Post-Op Plan: ENT consultation  Diagnostic Codes: G47.33 Obstructive sleep apnea (adult) (pediatric)  Liliauna Santoni V. Vassie Loll MD

## 2023-05-17 NOTE — Interval H&P Note (Signed)
History and Physical Interval Note:  05/17/2023 7:38 AM  [name]  has presented today for surgery, with the diagnosis of sleep apnea.  The various methods of treatment have been discussed with the patient and family. After consideration of risks, benefits and other options for treatment, the patient has consented to  Procedure(s): DRUG INDUCED ENDOSCOPY (N/A) as a surgical intervention.  The patient's history has been reviewed, patient examined, no change in status, stable for surgery.  I have reviewed the patient's chart and labs.  Questions were answered to the patient's satisfaction.     Comer Locket Vassie Loll

## 2023-05-17 NOTE — Anesthesia Postprocedure Evaluation (Signed)
Anesthesia Post Note  Patient: [name]  Procedure(s) Performed: DRUG INDUCED ENDOSCOPY     Patient location during evaluation: PACU Anesthesia Type: MAC Level of consciousness: awake and alert Pain management: pain level controlled Vital Signs Assessment: post-procedure vital signs reviewed and stable Respiratory status: spontaneous breathing, nonlabored ventilation and respiratory function stable Cardiovascular status: stable and blood pressure returned to baseline Anesthetic complications: no   No notable events documented.  Last Vitals:  Vitals:   05/17/23 0815 05/17/23 0830  BP: 98/82 (!) 123/90  Pulse: (!) 52 (!) 52  Resp: 17 18  Temp:  36.6 C  SpO2: 94% 95%    Last Pain:  Vitals:   05/17/23 0805  TempSrc:   PainSc: 0-No pain                 Beryle Lathe

## 2023-05-17 NOTE — Discharge Instructions (Addendum)
You qualify for inspire device ENT consultation

## 2023-05-17 NOTE — Telephone Encounter (Signed)
DISE performed. She is a candidate Please refer to ENT

## 2023-05-20 ENCOUNTER — Encounter (HOSPITAL_COMMUNITY): Payer: Self-pay | Admitting: Pulmonary Disease

## 2023-05-29 ENCOUNTER — Ambulatory Visit (INDEPENDENT_AMBULATORY_CARE_PROVIDER_SITE_OTHER): Payer: Medicare Other | Admitting: Otolaryngology

## 2023-05-29 ENCOUNTER — Encounter (INDEPENDENT_AMBULATORY_CARE_PROVIDER_SITE_OTHER): Payer: Self-pay | Admitting: Otolaryngology

## 2023-05-29 VITALS — BP 163/95 | HR 68 | Ht 65.5 in | Wt 194.0 lb

## 2023-05-29 DIAGNOSIS — F17210 Nicotine dependence, cigarettes, uncomplicated: Secondary | ICD-10-CM | POA: Diagnosis not present

## 2023-05-29 DIAGNOSIS — F17200 Nicotine dependence, unspecified, uncomplicated: Secondary | ICD-10-CM

## 2023-05-29 DIAGNOSIS — Z789 Other specified health status: Secondary | ICD-10-CM

## 2023-05-29 DIAGNOSIS — R0683 Snoring: Secondary | ICD-10-CM | POA: Diagnosis not present

## 2023-05-29 DIAGNOSIS — K219 Gastro-esophageal reflux disease without esophagitis: Secondary | ICD-10-CM

## 2023-05-29 DIAGNOSIS — B370 Candidal stomatitis: Secondary | ICD-10-CM

## 2023-05-29 DIAGNOSIS — J449 Chronic obstructive pulmonary disease, unspecified: Secondary | ICD-10-CM

## 2023-05-29 DIAGNOSIS — I2510 Atherosclerotic heart disease of native coronary artery without angina pectoris: Secondary | ICD-10-CM

## 2023-05-29 DIAGNOSIS — R0981 Nasal congestion: Secondary | ICD-10-CM

## 2023-05-29 DIAGNOSIS — G4733 Obstructive sleep apnea (adult) (pediatric): Secondary | ICD-10-CM | POA: Diagnosis not present

## 2023-05-29 DIAGNOSIS — Z951 Presence of aortocoronary bypass graft: Secondary | ICD-10-CM

## 2023-05-29 MED ORDER — NYSTATIN 100000 UNIT/ML MT SUSP: 5.0000 mL | Freq: Four times a day (QID) | OROMUCOSAL | 0 refills | Status: DC

## 2023-05-29 NOTE — Patient Instructions (Signed)
After Inspire   DIET: Resume normal diet HYGIENE: Please wait until 48 hours after surgery before getting incisions on neck, chest, and torso wet. In the first 48 hours after surgery, will likely need to take sponge baths. WOUND CARE: Please leave pressure dressing on for 48 hours after surgery. Gently place antibiotic ointment over incisions 2 times per day; use clean q-tip. May place a clean bandage over incisions as needed. After 48 hours, you may get incisions wet with warm soap and water, but do not soak the incisions.  Pat area dry gently.  Immediately place antibiotic ointment. Take oral antibiotics as prescribed If skin around incision starts to get red (> 1cm), swollen, and/or more painful, please call the office ACTIVITY: Try to avoid sleeping on the side of your surgery, to the extent possible.   You may walk for exercise starting the day after surgery. For 2 weeks: Do not pick up anything greater than 5 pounds with the hand/arm that's on the same side as the surgery.  After 2 weeks, you may increase weight to 10 pounds.   Consider performing neck rolls 10 clockwise and 10 counterclockwise 3x/day. For 4 weeks, no strenuous activity (running, jogging, lifting weights, gardening, sports) or until cleared by physician.   PAIN MEDICATIONS:    If pain is not severe, consider taking Tylenol 650mg  every 6 hours Avoid aspirin for 7 days after surgery Avoid direct heat (such as heating pads) to incision sites.   May gently place ice over surgery sites as needed.  Please place a thin clean towel over skin first and then place ice bag over towel.  Ice for 10 minutes at a time only.  POST-OPERATIVE CLINIC APPOINTMENTS: 1 week: suture removal and wound check in the office.  1 month: device activation and wound check in the office. 2.5 months: check in visit to assess usage. 3-4 months: titration sleep study based on usage of >4 hrs/night.  4 months: final wound check in the office.   Yearly: device check at office.  SCAR CARE: After incisions have healed, you will have a scar, which will continue to evolve over the course of 12 months.  Caring for your incision scars will help them to be as minimal as possible. If you are out in the sun with incision exposure, please remember to place sunscreen over the incision and surrounding skin.   You may use vitamin E or "Scar ointment/cream" to help soften scar.  Please wait one month after surgery before starting this.

## 2023-05-29 NOTE — Progress Notes (Incomplete)
SUBJECTIVE:  Chief Complaint: OSA, CPAP intolerance and other chronic problems  Referring sleep doc: Dr Vassie Loll  HPI: Pt is a 62 y.o. year old female with a h/o OSA dx approx 2010. Since diagnosis, pt has not been able to tolerate their CPAP device. AHI on most recent diagnostic sleep study was 33.2 on 10/14/2022. Pt has not been able to tolerate CPAP due to mask pressure on her face. Pt has tried multiple masks for their OSA. Pt had a DISE exam in the past, and was considered a candidate for Inspire surgery. Pt  had recent weight changes Pt does/does not *** have co-existing cardiovascular disease (***). Pt has been/has not been *** diagnosed with insomnia in the past. ISI=***.  Cardiac cath and arrest 2009. She is f/b Cardiology Dr York Ram and she has recent diagnosis of COPD, cut down from 3 PPD to 2 cigarettes a day. She is f/b [facility], and on   HPI: 62 year old female, active smoker followed for COPD with chronic bronchitis and emphysema. She is a former patient of Dr. Ulyses Jarred and last seen in office 03/23/2023. Past medical history significant for CAD s/p CABG, HTN, hx of NSTEMI, HLD, bipolar disorder, severe OSA, GERD.   TEST/EVENTS:  08/25/2007 PSG: AHI 20.6/h >> optimal CPAP pressure 7 cmH2O 10/14/2022 HST: AHI 33.2/h, SpO2 low 80%   03/22/2022: OV with Dr. Kendrick Fries. Coughing up white mucus for years; no change. Recently had a flare up after respiratory infection following exposure to sick kids. Treated with medications but can't remember the names. DOE most of her adult life. Daytime fatigue; snores a lot. Advanced cardiac disease, CAD. Previous CABG. Spirometry showed moderate airflow obstruction. Still smoking, likely contributing to symptoms. Noncompliant with Breo. Will switch to Anoro. Smoking cessation advised. Had CT outside - will request records. Split night sleep study ordered. Follow up 1 week.   05/02/2023: Today - follow up Patient presents today for overdue follow  up. Her breathing has been about the same since she was here last. No worse but no better. Not currently on any scheduled inhalers. She wasn't sure the Anoro really helped her. Doesn't remember much about her response to [facility]. She does use her albuterol daily. No recent exacerbations requiring steroids/abx. She has a daily cough with white phlegm, which is unchanged. Notices an occasional wheeze. She does struggle with reflux. She has not been on anything recently. She has this regardless of what she eats but definitely avoids certain trigger foods. Denies fevers, chills, hemoptysis, leg swelling, orthopnea, anorexia, weight loss. No difficulties swallowing, abd pain, changes in bowel habits, hoarse voice. She is seeing an ENT for chronic sinusitis symptoms. No worse than normal.    She has had a repeat sleep study since she was here last. She has severe sleep apnea with AHI 33.2/h from [facility] 10/14/2022. She has significant daytime fatigue and restless sleep at night. Never feels rested. She was originally diagnosed with OSA in 2009. She was on CPAP but could not tolerate it even after a CPAP desensitization study. She tried multiple masks and setting adjustments. She has PTSD and is just unable to wear something on her face at night. She feels like her symptoms have gotten worse over the past few years. She is interested in [place] device.     Past Medical/Surgical History She  Her  has a past surgical history that includes ORIF foot fracture (Left); C-SECTIONS X 2 (1985, 1988); TOE SURGERIES TO REMOVE NAILS DUE TO CLUBBING;  Total knee arthroplasty (Right, 10/21/2012); Cardiac catheterization (12/10/2006; 12/18/2006; 07/10/2007; 07/19/2009); Cardiac catheterization (03/04/2010); Knee arthroscopy with anterior cruciate ligament (acl) repair (Right, 01/30/2008); Coronary artery bypass graft (07/16/2007); Novasure ablation (12/19/2007); Flexible bronchoscopy (07/16/2007); Club foot release (Bilateral); Hand surgery  (Bilateral); Hammertoe reconstruction with weil osteotomy (Right, 06/03/2015); Cardiac catheterization (N/A, 02/15/2016); LEFT HEART CATH AND CORS/GRAFTS ANGIOGRAPHY (N/A, 06/23/2019); CORONARY STENT INTERVENTION (N/A, 06/23/2019); LEFT HEART CATH AND CORS/GRAFTS ANGIOGRAPHY (N/A, 03/01/2020); Colonoscopy; Upper gastrointestinal endoscopy; and Drug induced endoscopy (N/A, 05/17/2023).  Past Family/Social History Her family history includes Anesthesia problems in her father; Breast cancer in her maternal aunt and maternal aunt; Colon polyps in her sister; Heart disease in her father; Kidney cancer in her mother; Lung cancer in her mother. She  reports that she has been smoking cigarettes. She has never used smokeless tobacco. She reports that she does not drink alcohol and does not use drugs.  Medications/Allergies/Immunizations Allergies: Benadryl [diphenhydramine hcl], Codeine, Haldol [haloperidol decanoate], Amoxicillin, and Bee venom, Immunizations: There is no immunization history for the selected administration types on file for this patient.  Review of Systems  ROS: Constitutional: Negative for fever, weight loss and weight gain. Cardiovascular: Negative for chest pain and dyspnea on exertion. Respiratory: Is not experiencing shortness of breath at rest. Gastrointestinal: Negative for nausea and vomiting. Neurological: Negative for headaches. Psychiatric: The patient is not nervous/anxious  OBJECTIVE:  Physical Exam Blood pressure (!) 163/95, pulse 68, height 5' 5.5" (1.664 m), weight 194 lb (88 kg), last menstrual period 11/16/2011, SpO2 96%. General:  Well-developed, well-nourished, no apparent distress Neck Circumference: *** Respiratory Respiratory effort:  Equal inspiration and expiration without stridor Auscultation:  Equal breath sounds bilaterally Cardiovascular Heart:  regular rate and rhythm Peripheral Vascular:  Warm extremities with equal pulses Eyes: No nystagmus  with equal extraocular motion bilaterally Neuro/Psych/Balance: Patient oriented to person, place, and time;  Appropriate mood and affect;  Gait is intact with no imbalance; Cranial nerves I-XII are intact Head and Face Inspection:  Normocephalic and atraumatic without mass or lesion Palpation:  Facial skeleton intact without bony stepoffs Salivary Glands:  No masses or tenderness Facial Strength:  Facial motility symmetric and full bilaterally ENT Pinna:  External ear intact and fully developed bilaterally External canal:  Canal is patent with intact skin bilaterally Tympanic Membrane:  Clear and mobile bilaterally Hearing: Midline Weber, pos Rinne bilaterally, normal clinical speech reception threshold (whispered voice, finger rub) External nose:  No scar or anatomic deformity Internal Nose:  Septum intact and midline.  No edema, polyp, or rhinorrhea. Lips, Teeth, and gums:  Mucosa and teeth intact and viable TMJ:  No pain to palpation with full mobility Oral cavity/oropharynx:  No erythema or exudate, No tonsils Tongue/palate position: Friedman 2 Nasopharynx:  No mass or lesion with intact mucosa Hypopharynx:  Intact mucosa without pooling of secretions Larynx:  Full true vocal cord mobility without lesion or mass Neck Neck and Trachea:  Midline trachea without mass or lesion Thyroid:  No mass or nodularity Lymphatics:  No lymphadenopathy  Preoperative diagnosis: OSA  Postoperative diagnosis:   Same  Procedure: Flexible fiberoptic laryngoscopy  Surgeon: Ashok Croon, MD  Anesthesia: Topical lidocaine and Afrin Complications: None Condition is stable throughout exam  Indications and consent:  The patient presents to the clinic with Indirect laryngoscopy view was incomplete. Thus it was recommended that they undergo a flexible fiberoptic laryngoscopy. All of the risks, benefits, and potential complications were reviewed with the patient preoperatively and verbal informed  consent was obtained.  Procedure: The patient was seated upright in the clinic. Topical lidocaine and Afrin were applied to the nasal cavity. After adequate anesthesia had occurred, I then proceeded to pass the flexible telescope into the nasal cavity. The nasal cavity was patent without rhinorrhea or polyp. The nasopharynx was also patent without mass or lesion. The base of tongue was visualized and was normal. There were no signs of pooling of secretions in the piriform sinuses. The true vocal folds were mobile bilaterally. There were no signs of glottic or supraglottic mucosal lesion or mass. There was moderate interarytenoid pachydermia and post cricoid edema. The telescope was then slowly withdrawn and the patient tolerated the procedure throughout.   ASSESSMENT/PLAN: There are no diagnoses linked to this encounter.   OSA, moderate to severe, with/without *** multilevel collapse, with failure to tolerate PAP therapy and/or more conservative measures. Presence of smaller/absent tonsils and larger tongue position (Friedman tongue position or modified Mallampati) suggests that hypopharyngeal/retrolingual collapse is contributing to the patient's OSA. Zachery Conch, M et al. Staging of obstructive sleep apnea/hypopnea syndrome: a guide to appropriate treatment. Laryngoscope, 2004 Mar, 114(3):454-9. PMID: [id number]) Options including positional therapy, weight loss, oral appliances, PAP and surgical correction discussed. Pt {is/is not:24985} ideal candidate for oral appliance due to *** Pt could be a candidate for traditional surgery (***) vs Hypoglossal nerve stimulation (Inspire therapy) pending ***

## 2023-05-29 NOTE — Progress Notes (Unsigned)
SUBJECTIVE:  Chief Complaint: OSA, CPAP intolerance and other chronic problems  Referring sleep doc: Dr Vassie Loll  HPI: Pt is a 62 y.o. year old female with a CAD s/p CABG, HTN, hx of NSTEMI, HLD, bipolar disorder, severe OSA, GERD here for evaluation of OSA and CPAP intolerance as well as other chronic problems. She has h/o OSA dx approx 2010. Since diagnosis, pt has not been able to tolerate their CPAP device. AHI on most recent diagnostic sleep study was 33.2 on 10/14/2022. Pt has not been able to tolerate CPAP due to mask pressure on her face, and not being able to sleep well with the CPAP mask on. Pt has tried multiple masks for their OSA. Pt had a DISE exam in the past, and was considered a candidate for Inspire surgery. Pt  had no recent weight changes but reports weight gain after the birth of her youngest son with inability to lose weight following her pregnancy.  Denies history of insomnia.  Current smoker but in the process of cutting down.  She is being treated for COPD, on inhalers.  She reports heart attack with cardiac arrest during cardiac cath in 2009. She is f/b Cardiology Dr Allyson Sabal and has a diagnosis of COPD. She was able to cut down from 3 PPD to 2 cigarettes a day. She is f/b [facility], and on inhalers. She reports remote history of cocaine addiction, no recent drug use, but but avoid narcotic pain medications.   Records reviewed  Dr Reginia Naas Op Note for DISE - passed 05/17/23  Note by Micheline [facility] 04/30/23 62 year old female, active smoker followed for COPD with chronic bronchitis and emphysema. She is a former patient of Dr. Ulyses Jarred and last seen in office 03/23/2023. Past medical history significant for CAD s/p CABG, HTN, hx of NSTEMI, HLD, bipolar disorder, severe OSA, GERD.   TEST/EVENTS:  08/25/2007 PSG: AHI 20.6/h >> optimal CPAP pressure 7 cmH2O 10/14/2022 HST: AHI 33.2/h, SpO2 low 80%   03/22/2022: OV with Dr. Kendrick Fries. Coughing up white mucus for years; no change.  Recently had a flare up after respiratory infection following exposure to sick kids. Treated with medications but can't remember the names. DOE most of her adult life. Daytime fatigue; snores a lot. Advanced cardiac disease, CAD. Previous CABG. Spirometry showed moderate airflow obstruction. Still smoking, likely contributing to symptoms. Noncompliant with Breo. Will switch to Anoro. Smoking cessation advised. Had CT outside - will request records. Split night sleep study ordered. Follow up 1 week.   05/02/2023: Today - follow up Patient presents today for overdue follow up. Her breathing has been about the same since she was here last. No worse but no better. Not currently on any scheduled inhalers. She wasn't sure the Anoro really helped her. Doesn't remember much about her response to [facility]. She does use her albuterol daily. No recent exacerbations requiring steroids/abx. She has a daily cough with white phlegm, which is unchanged. Notices an occasional wheeze. She does struggle with reflux. She has not been on anything recently. She has this regardless of what she eats but definitely avoids certain trigger foods. Denies fevers, chills, hemoptysis, leg swelling, orthopnea, anorexia, weight loss. No difficulties swallowing, abd pain, changes in bowel habits, hoarse voice. She is seeing an ENT for chronic sinusitis symptoms. No worse than normal.    She has had a repeat sleep study since she was here last. She has severe sleep apnea with AHI 33.2/h from [facility] 10/14/2022. She has significant daytime  fatigue and restless sleep at night. Never feels rested. She was originally diagnosed with OSA in 2009. She was on CPAP but could not tolerate it even after a CPAP desensitization study. She tried multiple masks and setting adjustments. She has PTSD and is just unable to wear something on her face at night. She feels like her symptoms have gotten worse over the past few years. She is interested in [place] device.      Past Medical/Surgical History She  Her  has a past surgical history that includes ORIF foot fracture (Left); C-SECTIONS X 2 (1985, 1988); TOE SURGERIES TO REMOVE NAILS DUE TO CLUBBING; Total knee arthroplasty (Right, 10/21/2012); Cardiac catheterization (12/10/2006; 12/18/2006; 07/10/2007; 07/19/2009); Cardiac catheterization (03/04/2010); Knee arthroscopy with anterior cruciate ligament (acl) repair (Right, 01/30/2008); Coronary artery bypass graft (07/16/2007); Novasure ablation (12/19/2007); Flexible bronchoscopy (07/16/2007); Club foot release (Bilateral); Hand surgery (Bilateral); Hammertoe reconstruction with weil osteotomy (Right, 06/03/2015); Cardiac catheterization (N/A, 02/15/2016); LEFT HEART CATH AND CORS/GRAFTS ANGIOGRAPHY (N/A, 06/23/2019); CORONARY STENT INTERVENTION (N/A, 06/23/2019); LEFT HEART CATH AND CORS/GRAFTS ANGIOGRAPHY (N/A, 03/01/2020); Colonoscopy; Upper gastrointestinal endoscopy; and Drug induced endoscopy (N/A, 05/17/2023).  Past Family/Social History Her family history includes Anesthesia problems in her father; Breast cancer in her maternal aunt and maternal aunt; Colon polyps in her sister; Heart disease in her father; Kidney cancer in her mother; Lung cancer in her mother. She  reports that she has been smoking cigarettes. She has never used smokeless tobacco. She reports that she does not drink alcohol and does not use drugs.  Medications/Allergies/Immunizations Allergies: Benadryl [diphenhydramine hcl], Codeine, Haldol [haloperidol decanoate], Amoxicillin, and Bee venom, Immunizations: There is no immunization history for the selected administration types on file for this patient.  Review of Systems  ROS: Constitutional: Negative for fever, weight loss and weight gain. Cardiovascular: Negative for chest pain and dyspnea on exertion. Respiratory: Is not experiencing shortness of breath at rest. Gastrointestinal: Negative for nausea and vomiting. Neurological:  Negative for headaches. Psychiatric: The patient is not nervous/anxious  OBJECTIVE:  Physical Exam Blood pressure (!) 163/95, pulse 68, height 5' 5.5" (1.664 m), weight 194 lb (88 kg), last menstrual period 11/16/2011, SpO2 96%. General:  Well-developed, well-nourished, no apparent distress Respiratory Respiratory effort:  Equal inspiration and expiration without stridor Auscultation:  Equal breath sounds bilaterally Cardiovascular Heart:  regular rate and rhythm Peripheral Vascular:  Warm extremities with equal pulses Eyes: No nystagmus with equal extraocular motion bilaterally Neuro/Psych/Balance: Patient oriented to person, place, and time;  Appropriate mood and affect;  Gait is intact with no imbalance; Cranial nerves I-XII are intact Head and Face Inspection:  Normocephalic and atraumatic without mass or lesion Palpation:  Facial skeleton intact without bony stepoffs Salivary Glands:  No masses or tenderness Facial Strength:  Facial motility symmetric and full bilaterally ENT Pinna:  External ear intact and fully developed bilaterally External canal:  Canal is patent with intact skin bilaterally Tympanic Membrane:  Clear and mobile bilaterally External nose:  No scar or anatomic deformity Internal Nose:  Septum is deviated to the left.  Mucosal edema present, no polyps, bilateral inferior turbinate hypertrophy presents Lips, Teeth, and gums:  Mucosa and teeth intact and viable, somewhat dry mucous membranes and scrappable white residue along the dorsal tongue, suspect thrush TMJ:  No pain to palpation with full mobility Oral cavity/oropharynx:  No erythema or exudate, No tonsils Tongue/palate position: Friedman 2-3 Nasopharynx:  No mass or lesion with intact mucosa Hypopharynx:  Intact mucosa without pooling of secretions Larynx:  Full true  vocal cord mobility without lesion or mass Neck Neck and Trachea:  Midline trachea without mass or lesion Thyroid:  No mass or  nodularity Lymphatics:  No lymphadenopathy  Preoperative diagnosis: OSA  Postoperative diagnosis:   Same + GERD LPR nasal congestion  Procedure: Flexible fiberoptic laryngoscopy  Surgeon: Ashok Croon, MD  Anesthesia: Topical lidocaine and Afrin Complications: None Condition is stable throughout exam  Indications and consent:  The patient presents to the clinic with Indirect laryngoscopy view was incomplete. Thus it was recommended that they undergo a flexible fiberoptic laryngoscopy. All of the risks, benefits, and potential complications were reviewed with the patient preoperatively and verbal informed consent was obtained.  Procedure: The patient was seated upright in the clinic. Topical lidocaine and Afrin were applied to the nasal cavity. After adequate anesthesia had occurred, I then proceeded to pass the flexible telescope into the nasal cavity. The nasal cavity was patent without rhinorrhea or polyp. The nasopharynx was also patent without mass or lesion. The base of tongue was visualized and was normal. There were no signs of pooling of secretions in the piriform sinuses. The true vocal folds were mobile bilaterally. There were no signs of glottic or supraglottic mucosal lesion or mass. There was moderate interarytenoid pachydermia and post cricoid edema. The telescope was then slowly withdrawn and the patient tolerated the procedure throughout.   ASSESSMENT/PLAN: Encounter Diagnoses  Name Primary?   Thrush    OSA (obstructive sleep apnea) Yes   Intolerance of continuous positive airway pressure (CPAP) ventilation    Chronic nasal congestion    Tobacco use disorder    Snoring [R06.83]    Chronic GERD    OSA, severe, without multilevel collapse, with failure to tolerate PAP therapy and/or more conservative measures. Presence of smaller/absent tonsils and larger tongue position (Friedman tongue position or modified Mallampati) suggests that hypopharyngeal/retrolingual  collapse is contributing to the patient's OSA. Zachery Conch, M et al. Staging of obstructive sleep apnea/hypopnea syndrome: a guide to appropriate treatment. Laryngoscope, 2004 Mar, 114(3):454-9. PMID: [id number]) Options including positional therapy, weight loss, oral appliances, PAP and surgical correction discussed. Pt is not ideal candidate for oral appliance due to severity of OSA  Pt is a candidate for Hypoglossal nerve stimulation (Inspire therapy) based on recently completed DISE procedure, BMI and my review of her recent sleep study from 10/14/22 which I reviewed:  AHI of 33.2  16% of central apneas (pAHI of 243, pAHIc of 41) BMI of 31.79 today   -Due to significant comorbidities including CAD history of CABG and current smoking history of COPD will require preoperative cardiology and pulmonary clearance -I discussed the risks and benefits of inspire implant surgery with the patient today and she would like to proceed -We discussed postoperative recovery expectations and the fact that surgery is normally outpatient procedure -Will schedule for inspire implant surgery at [facility] Main with anesthesia consult if needed -She reports needing to be admitted after all of her surgeries in the past, we will make a decision about 24-hour [place] on the day of the procedure -Cannot have narcotics for pain control due to remote history of cocaine use we will plan to use Tylenol and NSAIDs for pain control  2.  Chronic nasal congestion -Suspect element of nasal congestion from environmental allergies or irritants including tobacco exposure -I discussed the importance of quitting smoking and she is trying to cut down, total of 5 minutes was spent counseling the patient on importance of quitting smoking -Nasal saline spray and Flonase for  nasal congestion 2 puffs bilateral nares twice daily  3. COPD and tobacco use disorder Inhalers per pulmonary recommendations -We discussed smoking cessation today  4.  Oral  thrush suspected based on exam today -Nystatin swish and spit 5 ml TID x 14 days  -Will recheck when she returns, denies pain with swallowing, no evidence of extension of thrush into the hypopharynx no laryngeal structure involvement on flexible scope exam today  5. Chronic GERD LPR -Continue Protonix 40 mg daily -We discussed diet and lifestyle changes to minimize reflux -Trial of reflux Gourmet

## 2023-05-30 ENCOUNTER — Institutional Professional Consult (permissible substitution) (INDEPENDENT_AMBULATORY_CARE_PROVIDER_SITE_OTHER): Payer: Medicare Other | Admitting: Otolaryngology

## 2023-05-30 MED ORDER — FLUTICASONE PROPIONATE 50 MCG/ACT NA SUSP: 2.0000 | Freq: Every day | NASAL | 6 refills | Status: AC

## 2023-06-11 ENCOUNTER — Ambulatory Visit (HOSPITAL_BASED_OUTPATIENT_CLINIC_OR_DEPARTMENT_OTHER): Payer: Medicare Other | Admitting: Pulmonary Disease

## 2023-06-11 ENCOUNTER — Ambulatory Visit: Payer: Medicare Other | Admitting: Podiatry

## 2023-06-11 DIAGNOSIS — J439 Emphysema, unspecified: Secondary | ICD-10-CM | POA: Diagnosis not present

## 2023-06-11 DIAGNOSIS — J4489 Other specified chronic obstructive pulmonary disease: Secondary | ICD-10-CM | POA: Diagnosis not present

## 2023-06-11 LAB — PULMONARY FUNCTION TEST
DL/VA % pred: 110 %
DL/VA: 4.74 ml/min/mmHg/L
DLCO cor % pred: 97 %
DLCO cor: 18.42 ml/min/mmHg
DLCO unc % pred: 97 %
DLCO unc: 18.42 ml/min/mmHg
FEF 25-75 Post: 1.56 L/s
FEF 25-75 Pre: 1.15 L/s
FEF2575-%Change-Post: 35 %
FEF2575-%Pred-Post: 71 %
FEF2575-%Pred-Pre: 52 %
FEV1-%Change-Post: 12 %
FEV1-%Pred-Post: 80 %
FEV1-%Pred-Pre: 71 %
FEV1-Post: 1.88 L
FEV1-Pre: 1.67 L
FEV1FVC-%Change-Post: 9 %
FEV1FVC-%Pred-Pre: 80 %
FEV6-%Change-Post: 3 %
FEV6-%Pred-Post: 93 %
FEV6-%Pred-Pre: 90 %
FEV6-Post: 2.73 L
FEV6-Pre: 2.65 L
FEV6FVC-%Change-Post: 0 %
FEV6FVC-%Pred-Post: 103 %
FEV6FVC-%Pred-Pre: 103 %
FVC-%Change-Post: 3 %
FVC-%Pred-Post: 89 %
FVC-%Pred-Pre: 87 %
FVC-Post: 2.73 L
FVC-Pre: 2.65 L
Post FEV1/FVC ratio: 69 %
Post FEV6/FVC ratio: 100 %
Pre FEV1/FVC ratio: 63 %
Pre FEV6/FVC Ratio: 100 %
RV % pred: 109 %
RV: 2.09 L
TLC % pred: 102 %
TLC: 4.87 L

## 2023-06-11 NOTE — Progress Notes (Signed)
Performed Full PFT today.  ?

## 2023-06-11 NOTE — Patient Instructions (Signed)
Full PFT Performed Today  

## 2023-06-12 ENCOUNTER — Encounter: Payer: Medicare Other | Admitting: Podiatry

## 2023-06-12 NOTE — Progress Notes (Addendum)
Anesthesia Review:  PCP: Delbert Harness  LOV 04/27/23  Cardiologist : DR Jeri Cos  preop eval on 03/29/23 with [name], NP - telephone visit  Pulm- DR alva  LOV 9/9/24Natalia Leatherwood Cobb,NP  Chest x-ray : PFT-06/11/23  EKG : 10/03/22  Echo : 05/25/21  Stress test: 01/06/22  Cardiac Cath : 2021  Activity level: can do a flight of stairs without difficutly  Sleep Study/ CPAP : has sleep apnea , going for final visit pror to Inspire insertoin on 07/09/2023  Fasting Blood Sugar :      / Checks Blood Sugar -- times a day:   Blood Thinner/ Instructions /Last Dose: ASA / Instructions/ Last Dose :    ASA- stope 5 days prior per pt  Plavix- stop 7 days prior per pt    PT has PTSD    Recent right foot surgery 03/2023  Hearing Aids Dentures Smoker    05/17/23- Drug Induced Endoscopy DR Vassie Loll    Blood pressure at preop was 148/103.  PT has not eaten today or taken meds today.  PT denies any chest pain shorness of breath, dizziness, headach or blurred vision.   AT end of preop appt pt to go and eat and take mes.

## 2023-06-12 NOTE — Progress Notes (Signed)
Patient was a no-show for her scheduled appointment today.

## 2023-06-13 NOTE — Progress Notes (Signed)
Surgery orders requested via Epic inbox. °

## 2023-06-13 NOTE — Patient Instructions (Addendum)
SURGICAL WAITING ROOM VISITATION  Patients having surgery or a procedure may have no more than 2 support people in the waiting area - these visitors may rotate.    Children under the age of 50 must have an adult with them who is not the patient.  Due to an increase in RSV and influenza rates and associated hospitalizations, children ages 70 and under may not visit patients in [facility] hospitals.  If the patient needs to stay at the hospital during part of their recovery, the visitor guidelines for inpatient rooms apply. Pre-op nurse will coordinate an appropriate time for 1 support person to accompany patient in pre-op.  This support person may not rotate.    Please refer to the [facility] website for the visitor guidelines for Inpatients (after your surgery is over and you are in a regular room).       Your procedure is scheduled on:  06/29/2023      Report to [facility] Main Entrance    Report to admitting at [phone]   Call this number if you have problems the morning of surgery [phone]   Do not eat food :After Midnight.   After Midnight you may have the following liquids until _ 0430_____ AM DAY OF SURGERY  Water Non-Citrus Juices (without pulp, NO RED-Apple, White grape, White cranberry) Black Coffee (NO MILK/CREAM OR CREAMERS, sugar ok)  Clear Tea (NO MILK/CREAM OR CREAMERS, sugar ok) regular and decaf                             Plain Jell-O (NO RED)                                           Fruit ices (not with fruit pulp, NO RED)                                     Popsicles (NO RED)                                                               Sports drinks like Gatorade (NO RED)                   The day of surgery:  Drink ONE (1) Pre-Surgery Clear Ensure or G2 at 0430 AM(  have completed by )  the morning of surgery. Drink in one sitting. Do not sip.  This drink was given to you during your hospital  pre-op appointment visit. Nothing else to  drink after completing the  Pre-Surgery Clear Ensure or G2.          If you have questions, please contact your surgeon's office.       Oral Hygiene is also important to reduce your risk of infection.                                    Remember - BRUSH YOUR TEETH THE MORNING OF SURGERY WITH YOUR REGULAR  TOOTHPASTE  DENTURES WILL BE REMOVED PRIOR TO SURGERY PLEASE DO NOT APPLY "Poly grip" OR ADHESIVES!!!   Do NOT smoke after Midnight   Stop all vitamins and herbal supplements 7 days before surgery.   Take these medicines the morning of surgery with A SIP OF WATER:  inhalers as usual and bring, amlodipine, eye dorps as usual, lexapro, flonase, vascepa., imdur, lamictal, metoprolol, ranexa   DO NOT TAKE ANY ORAL DIABETIC MEDICATIONS DAY OF YOUR SURGERY  Bring CPAP mask and tubing day of surgery.                              You may not have any metal on your body including hair pins, jewelry, and body piercing             Do not wear make-up, lotions, powders, perfumes/cologne, or deodorant  Do not wear nail polish including gel and S&S, artificial/acrylic nails, or any other type of covering on natural nails including finger and toenails. If you have artificial nails, gel coating, etc. that needs to be removed by a nail salon please have this removed prior to surgery or surgery may need to be canceled/ delayed if the surgeon/ anesthesia feels like they are unable to be safely monitored.   Do not shave  48 hours prior to surgery.               Men may shave face and neck.   Do not bring valuables to the hospital. [name] IS NOT             RESPONSIBLE   FOR VALUABLES.   Contacts, glasses, dentures or bridgework may not be worn into surgery.   Bring small overnight bag day of surgery.   DO NOT BRING YOUR HOME MEDICATIONS TO THE HOSPITAL. PHARMACY WILL DISPENSE MEDICATIONS LISTED ON YOUR MEDICATION LIST TO YOU DURING YOUR ADMISSION IN THE HOSPITAL!    Patients discharged on  the day of surgery will not be allowed to drive home.  Someone NEEDS to stay with you for the first 24 hours after anesthesia.   Special Instructions: Bring a copy of your healthcare power of attorney and living will documents the day of surgery if you haven't scanned them before.              Please read over the following fact sheets you were given: IF YOU HAVE QUESTIONS ABOUT YOUR PRE-OP INSTRUCTIONS PLEASE CALL [phone]   If you received a COVID test during your pre-op visit  it is requested that you wear a mask when out in public, stay away from anyone that may not be feeling well and notify your surgeon if you develop symptoms. If you test positive for Covid or have been in contact with anyone that has tested positive in the last 10 days please notify you surgeon.    [name] - Preparing for Surgery Before surgery, you can play an important role.  Because skin is not sterile, your skin needs to be as free of germs as possible.  You can reduce the number of germs on your skin by washing with CHG (chlorahexidine gluconate) soap before surgery.  CHG is an antiseptic cleaner which kills germs and bonds with the skin to continue killing germs even after washing. Please DO NOT use if you have an allergy to CHG or antibacterial soaps.  If your skin becomes reddened/irritated stop using the CHG and inform  your nurse when you arrive at Short Stay. Do not shave (including legs and underarms) for at least 48 hours prior to the first CHG shower.  You may shave your face/neck. Please follow these instructions carefully:  1.  Shower with CHG Soap the night before surgery and the  morning of Surgery.  2.  If you choose to wash your hair, wash your hair first as usual with your  normal  shampoo.  3.  After you shampoo, rinse your hair and body thoroughly to remove the  shampoo.                           4.  Use CHG as you would any other liquid soap.  You can apply chg directly  to the skin and wash                        Gently with a scrungie or clean washcloth.  5.  Apply the CHG Soap to your body ONLY FROM THE NECK DOWN.   Do not use on face/ open                           Wound or open sores. Avoid contact with eyes, ears mouth and genitals (private parts).                       Wash face,  Genitals (private parts) with your normal soap.             6.  Wash thoroughly, paying special attention to the area where your surgery  will be performed.  7.  Thoroughly rinse your body with warm water from the neck down.  8.  DO NOT shower/wash with your normal soap after using and rinsing off  the CHG Soap.                9.  Pat yourself dry with a clean towel.            10.  Wear clean pajamas.            11.  Place clean sheets on your bed the night of your first shower and do not  sleep with pets. Day of Surgery : Do not apply any lotions/deodorants the morning of surgery.  Please wear clean clothes to the hospital/surgery center.  FAILURE TO FOLLOW THESE INSTRUCTIONS MAY RESULT IN THE CANCELLATION OF YOUR SURGERY PATIENT SIGNATURE_________________________________  NURSE SIGNATURE__________________________________  ________________________________________________________________________

## 2023-06-14 ENCOUNTER — Ambulatory Visit: Payer: Self-pay | Admitting: General Surgery

## 2023-06-14 DIAGNOSIS — R1011 Right upper quadrant pain: Secondary | ICD-10-CM

## 2023-06-14 NOTE — Progress Notes (Signed)
Second request for pre op orders in [facility] with Toniann Fail at [facility].

## 2023-06-18 ENCOUNTER — Encounter (HOSPITAL_COMMUNITY): Payer: Self-pay

## 2023-06-18 ENCOUNTER — Encounter (HOSPITAL_COMMUNITY)
Admission: RE | Admit: 2023-06-18 | Discharge: 2023-06-18 | Disposition: A | Discharge status: Home / Self Care | Payer: Medicare Other | Source: Ambulatory Visit | Attending: General Surgery | Admitting: General Surgery

## 2023-06-18 ENCOUNTER — Other Ambulatory Visit: Payer: Self-pay

## 2023-06-18 DIAGNOSIS — Z01818 Encounter for other preprocedural examination: Secondary | ICD-10-CM | POA: Diagnosis present

## 2023-06-18 DIAGNOSIS — R1011 Right upper quadrant pain: Secondary | ICD-10-CM | POA: Diagnosis not present

## 2023-06-18 HISTORY — DX: Depression, unspecified: F32.A

## 2023-06-18 HISTORY — DX: Dyspnea, unspecified: R06.00

## 2023-06-18 LAB — CBC WITH DIFFERENTIAL/PLATELET
Abs Immature Granulocytes: 0.03 10*3/uL (ref 0.00–0.07)
Basophils Absolute: 0.1 10*3/uL (ref 0.0–0.1)
Basophils Relative: 1 %
Eosinophils Absolute: 0.2 10*3/uL (ref 0.0–0.5)
Eosinophils Relative: 2 %
HCT: 41.7 % (ref 36.0–46.0)
Hemoglobin: 13.9 g/dL (ref 12.0–15.0)
Immature Granulocytes: 0 %
Lymphocytes Relative: 31 %
Lymphs Abs: 3.2 10*3/uL (ref 0.7–4.0)
MCH: 32.6 pg (ref 26.0–34.0)
MCHC: 33.3 g/dL (ref 30.0–36.0)
MCV: 97.9 fL (ref 80.0–100.0)
Monocytes Absolute: 0.8 10*3/uL (ref 0.1–1.0)
Monocytes Relative: 8 %
Neutro Abs: 6.1 10*3/uL (ref 1.7–7.7)
Neutrophils Relative %: 58 %
Platelets: 270 10*3/uL (ref 150–400)
RBC: 4.26 MIL/uL (ref 3.87–5.11)
RDW: 14.8 % (ref 11.5–15.5)
WBC: 10.4 10*3/uL (ref 4.0–10.5)
nRBC: 0 % (ref 0.0–0.2)

## 2023-06-18 LAB — COMPREHENSIVE METABOLIC PANEL
ALT: 14 U/L (ref 0–44)
AST: 18 U/L (ref 15–41)
Albumin: 3.8 g/dL (ref 3.5–5.0)
Alkaline Phosphatase: 122 U/L (ref 38–126)
Anion gap: 11 (ref 5–15)
BUN: 21 mg/dL (ref 8–23)
CO2: 22 mmol/L (ref 22–32)
Calcium: 9.2 mg/dL (ref 8.9–10.3)
Chloride: 105 mmol/L (ref 98–111)
Creatinine, Ser: 0.84 mg/dL (ref 0.44–1.00)
GFR, Estimated: >60 mL/min (ref >60)
Glucose, Bld: 112 mg/dL — ABNORMAL HIGH (ref 70–99)
Potassium: 4.2 mmol/L (ref 3.5–5.1)
Sodium: 138 mmol/L (ref 135–145)
Total Bilirubin: 0.8 mg/dL (ref 0.3–1.2)
Total Protein: 7.3 g/dL (ref 6.5–8.1)

## 2023-06-18 MED ORDER — ACETAMINOPHEN 500 MG PO TABS
1000.0000 mg | ORAL_TABLET | ORAL | Status: DC
  Filled 2023-06-18: qty 2

## 2023-06-19 ENCOUNTER — Ambulatory Visit (INDEPENDENT_AMBULATORY_CARE_PROVIDER_SITE_OTHER): Payer: Medicare Other | Admitting: Pulmonary Disease

## 2023-06-19 ENCOUNTER — Encounter: Payer: Self-pay | Admitting: Pulmonary Disease

## 2023-06-19 VITALS — BP 143/84 | HR 59 | Temp 98.2°F | Ht 65.5 in | Wt 193.0 lb

## 2023-06-19 DIAGNOSIS — J4489 Other specified chronic obstructive pulmonary disease: Secondary | ICD-10-CM | POA: Diagnosis not present

## 2023-06-19 DIAGNOSIS — J439 Emphysema, unspecified: Secondary | ICD-10-CM

## 2023-06-19 DIAGNOSIS — Z23 Encounter for immunization: Secondary | ICD-10-CM | POA: Diagnosis not present

## 2023-06-19 NOTE — Addendum Note (Signed)
Addended by: Christen Butter on: 06/19/2023 03:12 PM   Modules accepted: Orders

## 2023-06-19 NOTE — Progress Notes (Signed)
[name]    [id number]    [birth year]  Primary Care Physician:Briscoe, Sharrie Rothman, MD  Referring Physician: Macy Mis, MD [address] [place],  [place] [address]  Chief complaint:   Review pulmonary function test  HPI:  Patient with obstructive lung disease, recently started on Breztri Symptoms are much better with [facility] use.  She has not reached for albuterol in the last month  She is very active  She is down to smoking just about a pack a month Was smoking up to 2 packs a day usually  Has a history of obstructive sleep apnea, awaiting inspire device implantation  She is doing well overall  Denies any pain or discomfort Denies any significant limitations with her breathing   Outpatient Encounter Medications as of 06/19/2023  Medication Sig   albuterol (PROVENTIL HFA;VENTOLIN HFA) 108 (90 BASE) MCG/ACT inhaler Inhale 2 puffs into the lungs every 6 (six) hours as needed for wheezing or shortness of breath.    Alirocumab (PRALUENT) 150 MG/ML SOAJ INJECT 1 PEN INTO THE SKIN EVERY 14 DAYS.   amLODipine (NORVASC) 5 MG tablet Take 1 tablet (5 mg total) by mouth daily.   aspirin 81 MG chewable tablet Chew 81 mg by mouth daily.   atorvastatin (LIPITOR) 80 MG tablet Take 1 tablet (80 mg total) by mouth daily.   brimonidine (ALPHAGAN) 0.15 % ophthalmic solution Place 1 drop into both eyes 2 (two) times daily.    Budeson-Glycopyrrol-Formoterol (BREZTRI AEROSPHERE) 160-9-4.8 MCG/ACT AERO Inhale 2 puffs into the lungs in the morning and at bedtime.   clopidogrel (PLAVIX) 75 MG tablet Take 1 tablet (75 mg total) by mouth daily.   escitalopram (LEXAPRO) 20 MG tablet Take 20 mg by mouth daily.   fluticasone (FLONASE) 50 MCG/ACT nasal spray Place 2 sprays into both nostrils daily.   icosapent Ethyl (VASCEPA) 1 g capsule Take 2 capsules (2 g total) by mouth 2 (two) times daily.   isosorbide mononitrate (IMDUR) 30 MG 24 hr tablet TAKE 3 TABLETS BY  MOUTH DAILY.   lamoTRIgine (LAMICTAL) 100 MG tablet Take 100 mg by mouth daily.   linaclotide (LINZESS) 145 MCG CAPS capsule Take 1 capsule (145 mcg total) by mouth daily before breakfast. (Patient taking differently: Take 145 mcg by mouth daily as needed (constipation).)   metoprolol tartrate (LOPRESSOR) 25 MG tablet TAKE 1/2 TABLET BY MOUTH TWICE A DAY   niacin (NIASPAN) 1000 MG CR tablet TAKE 1 TABLET (1,000 MG TOTAL) BY MOUTH AT BEDTIME.   nitroGLYCERIN (NITROSTAT) 0.4 MG SL tablet Place 1 tablet (0.4 mg total) under the tongue every 5 (five) minutes as needed for chest pain.   nystatin (MYCOSTATIN) 100000 UNIT/ML suspension Take 5 mLs (500,000 Units total) by mouth 4 (four) times daily.   pantoprazole (PROTONIX) 40 MG tablet Take 1 tablet (40 mg total) by mouth daily.   QUEtiapine (SEROQUEL) 100 MG tablet Take 100 mg by mouth at bedtime.   ranolazine (RANEXA) 500 MG 12 hr tablet TAKE 1 TABLET BY MOUTH TWICE A DAY   Vitamin D, Ergocalciferol, (DRISDOL) 1.25 MG (50000 UNIT) CAPS capsule Take 50,000 Units by mouth every 7 (seven) days.   Facility-Administered Encounter Medications as of 06/19/2023  Medication   0.9 %  sodium chloride infusion    Allergies as of 06/19/2023 - Review Complete 06/19/2023  Allergen Reaction Noted   Benadryl [diphenhydramine hcl] Shortness Of Breath and Swelling 06/03/2015   Codeine Other (See  Comments) 11/18/2011   Haldol [haloperidol decanoate] Anaphylaxis 11/18/2011   Amoxicillin Hives and Swelling 11/18/2011   Bee venom Swelling and Other (See Comments) 11/16/2015    Past Medical History:  Diagnosis Date   Allergy    Anemia    takes iron supplement   Arthritis    knees, shoulders   Asthma    Bipolar 1 disorder (HCC)    Blood transfusion without reported diagnosis    Cataract, immature    CHF (congestive heart failure) (HCC)    Clotting disorder (HCC)    COPD (chronic obstructive pulmonary disease) (HCC)    Coronary artery disease    triple  vessel   Depression    Dyspnea    Family history of adverse reaction to anesthesia    states father "did not do well" with anesthesia; died last time he had surgery; also had heart disease   Gallstones    GERD (gastroesophageal reflux disease)    occasional - no current med.   Glaucoma    Hammertoe 05/2015   right second toe   Heart murmur    Hepatic steatosis    High cholesterol    History of MI (myocardial infarction)    Hypertension    states under control with meds., has been on med. x 8 yr.   Myocardial infarction (HCC)    Osteoporosis    Panic attacks    Peripheral vascular disease (HCC)    carotid   Renal cyst, left    Schizophrenia (HCC)    Short of breath on exertion    Sleep apnea    Inspire in future   Urinary, incontinence, stress female    Wears dentures    upper   Wears partial dentures    lower    Past Surgical History:  Procedure Laterality Date   C-SECTIONS X 2  1985, 1988   CARDIAC CATHETERIZATION  12/10/2006; 12/18/2006; 07/10/2007; 07/19/2009   CARDIAC CATHETERIZATION  03/04/2010   patent grafts, normal LV function, diffusely diseased RCA (Dr. Erlene Quan)   CARDIAC CATHETERIZATION N/A 02/15/2016   Procedure: Left Heart Cath and Cors/Grafts Angiography;  Surgeon: Peter M Swaziland, MD;  Location: [facility] INVASIVE CV LAB;  Service: Cardiovascular;  Laterality: N/A;   CLUB FOOT RELEASE Bilateral    1st and 2nd toes   COLONOSCOPY     CORONARY ARTERY BYPASS GRAFT  07/16/2007   LIMA to LAD, VG to ramus, VG to PDA    CORONARY STENT INTERVENTION N/A 06/23/2019   Procedure: CORONARY STENT INTERVENTION;  Surgeon: Runell Gess, MD;  Location: [facility] INVASIVE CV LAB;  Service: Cardiovascular;  Laterality: N/A;   DRUG INDUCED ENDOSCOPY N/A 05/17/2023   Procedure: DRUG INDUCED ENDOSCOPY;  Surgeon: Oretha Milch, MD;  Location: [facility] ENDOSCOPY;  Service: Pulmonary;  Laterality: N/A;   FLEXIBLE BRONCHOSCOPY  07/16/2007   HAMMERTOE RECONSTRUCTION WITH WEIL OSTEOTOMY Right  06/03/2015   Procedure: RIGHT SECOND HAMMERTOE CORRECTION WITH MT WEIL OSTEOTOMY;  Surgeon: Toni Arthurs, MD;  Location: [facility];  Service: Orthopedics;  Laterality: Right;   HAND SURGERY Bilateral    1st and 2nd fingers - release of clubbing   KNEE ARTHROSCOPY WITH ANTERIOR CRUCIATE LIGAMENT (ACL) REPAIR Right 01/30/2008   LEFT HEART CATH AND CORS/GRAFTS ANGIOGRAPHY N/A 06/23/2019   Procedure: LEFT HEART CATH AND CORS/GRAFTS ANGIOGRAPHY;  Surgeon: Runell Gess, MD;  Location: [facility] INVASIVE CV LAB;  Service: Cardiovascular;  Laterality: N/A;   LEFT HEART CATH AND CORS/GRAFTS ANGIOGRAPHY N/A 03/01/2020  Procedure: LEFT HEART CATH AND CORS/GRAFTS ANGIOGRAPHY;  Surgeon: Lyn Records, MD;  Location: [facility] INVASIVE CV LAB;  Service: Cardiovascular;  Laterality: N/A;   NOVASURE ABLATION  12/19/2007   with removal of IUD   ORIF FOOT FRACTURE Left    TOE SURGERIES TO REMOVE NAILS DUE TO CLUBBING     TOTAL KNEE ARTHROPLASTY Right 10/21/2012   Procedure: TOTAL KNEE ARTHROPLASTY;  Surgeon: Shelda Pal, MD;  Location: [facility];  Service: Orthopedics;  Laterality: Right;   UPPER GASTROINTESTINAL ENDOSCOPY      Family History  Problem Relation Age of Onset   Lung cancer Mother    Kidney cancer Mother    Heart disease Father    Anesthesia problems Father        states "did not do well" with anesthesia   Colon polyps Sister    Breast cancer Maternal Aunt    Breast cancer Maternal Aunt    Stomach cancer Neg Hx    Esophageal cancer Neg Hx    Pancreatic cancer Neg Hx    Rectal cancer Neg Hx    Colon cancer Neg Hx     Social History   Socioeconomic History   Marital status: Divorced    Spouse name: Not on file   Number of children: Not on file   Years of education: Not on file   Highest education level: Not on file  Occupational History   Not on file  Tobacco Use   Smoking status: Some Days    Current packs/day: 2.00    Average packs/day: 2.0 packs/day for 37.8 years  (75.7 ttl pk-yrs)    Types: Cigarettes    Start date: 08/21/1985   Smokeless tobacco: Never  Vaping Use   Vaping status: Never Used  Substance and Sexual Activity   Alcohol use: No   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   Sexual activity: Not on file  Other Topics Concern   Not on file  Social History Narrative   Not on file   Social Determinants of Health   Financial Resource Strain: Low Risk  (04/26/2023)   Received [facility]   Overall Financial Resource Strain (CARDIA)    Difficulty of Paying Living Expenses: Not hard at all  Food Insecurity: No Food Insecurity (04/26/2023)   Received from [facility]   Hunger Vital Sign    Worried About Running Out of Food in the Last Year: Never true    Ran Out of Food in the Last Year: Never true  Transportation Needs: No Transportation Needs (04/26/2023)   Received from [facility] - Transportation    Lack of Transportation (Medical): No    Lack of Transportation (Non-Medical): No  Physical Activity: Insufficiently Active (04/26/2023)   Received from [facility]   Exercise Vital Sign    Days of Exercise per Week: 1 day    Minutes of Exercise per Session: 50 min  Stress: Stress Concern Present (04/26/2023)   Received from [facility] of Occupational [facility] - Occupational Stress Questionnaire    Feeling of Stress : To some extent  Social Connections: Somewhat Isolated (04/26/2023)   Received from [facility]   Social Network    How would you rate your social network (family, work, friends)?: Restricted participation with some degree of social isolation  Intimate Partner Violence: Unknown (11/24/2021)   Received from [facility]   HITS  Physically Hurt: Not on file    Insult or Talk Down To: Not on file    Threaten Physical Harm: Not on file    Scream or Curse: Not on file    Review of Systems  Respiratory:  Positive for apnea and shortness of breath. Negative  for cough.   Psychiatric/Behavioral:  Positive for sleep disturbance.     Vitals:   06/19/23 1417  BP: (!) 143/84  Pulse: (!) 59  Temp: 98.2 F (36.8 C)  SpO2: 95%     Physical Exam Constitutional:      Appearance: Normal appearance.  HENT:     Head: Normocephalic.     Mouth/Throat:     Mouth: Mucous membranes are moist.  Eyes:     General: No scleral icterus. Cardiovascular:     Rate and Rhythm: Normal rate and regular rhythm.     Heart sounds: No murmur heard.    No friction rub.  Pulmonary:     Effort: No respiratory distress.     Breath sounds: No stridor. No wheezing or rhonchi.  Musculoskeletal:     Cervical back: No rigidity or tenderness.  Neurological:     Mental Status: She is alert.    Data Reviewed: PFT was reviewed with the patient during the visit today showing mild obstructive disease with significant bronchodilator response, normal diffusing capacity, normal lung volumes  Assessment:  Mild obstructive disease  Significant improvement in symptoms with use of Breztri -Compliant with Breztri use  Active smoker -Has cut down significantly with her smoking and is planning on quitting at some point  History of obstructive sleep apnea -Pending inspire device implantation  Plan/Recommendations: Smoking cessation counseling  Continue using Breztri  Continue using albuterol as needed  Continue regular activities as tolerated  Call us with significant concerns  Follow-up in 6 months   Virl Diamond MD [facility] 06/19/2023, 2:35 PM  CC: Macy Mis, MD

## 2023-06-19 NOTE — Patient Instructions (Addendum)
Can get your flu shot today  I will see you in about 6 months  Good luck with your inspire device implantation  Continue to work on quitting smoking completely  Continue your Breztri  Continue regular exercises  Call us with significant concerns

## 2023-06-22 NOTE — Anesthesia Preprocedure Evaluation (Addendum)
Anesthesia Evaluation  Patient identified by MRN, date of birth, ID band Patient awake    Reviewed: Allergy & Precautions, NPO status , Patient's Chart, lab work & pertinent test results, reviewed documented beta blocker date and time   History of Anesthesia Complications Negative for: history of anesthetic complications  Airway Mallampati: II  TM Distance: >3 FB Neck ROM: Full    Dental no notable dental hx. (+) Dental Advisory Given, Upper Dentures, Partial Lower   Pulmonary asthma , sleep apnea , COPD,  COPD inhaler, Current Smoker and Patient abstained from smoking.   Pulmonary exam normal breath sounds clear to auscultation       Cardiovascular hypertension, Pt. on medications and Pt. on home beta blockers + CAD, + Past MI, + Cardiac Stents, + CABG and + Peripheral Vascular Disease  Normal cardiovascular exam Rhythm:Regular Rate:Normal   '23 Myoperfusion - Small inferior basal wall infarct with no ischemia. EF preserved 62%    Neuro/Psych  PSYCHIATRIC DISORDERS Anxiety  Bipolar Disorder Schizophrenia  negative neurological ROS     GI/Hepatic Neg liver ROS,GERD  Medicated and Controlled,,cholelithiasis   Endo/Other    Renal/GU negative Renal ROS  Female GU complaint     Musculoskeletal  (+) Arthritis ,    Abdominal   Peds  Hematology   Anesthesia Other Findings   Reproductive/Obstetrics                              Anesthesia Physical Anesthesia Plan  ASA: 4  Anesthesia Plan: General   Post-op Pain Management: Minimal or no pain anticipated   Induction: Intravenous  PONV Risk Score and Plan: 1 and Ondansetron and Dexamethasone  Airway Management Planned: Oral ETT  Additional Equipment: None  Intra-op Plan:   Post-operative Plan: Extubation in OR  Informed Consent: I have reviewed the patients History and Physical, chart, labs and discussed the procedure including  the risks, benefits and alternatives for the proposed anesthesia with the patient or authorized representative who has indicated his/her understanding and acceptance.     Dental advisory given  Plan Discussed with: CRNA and Anesthesiologist  Anesthesia Plan Comments: (62 y.o. some day smoker with h/o HTN, COPD, sleep apnea, CAD (CABG 2008, RCA stent), CHF, PVD, symptomatic cholelithiasis scheduled for above procedure 06/29/2023 with Dr. Gaynelle Adu.    )        Anesthesia Quick Evaluation

## 2023-06-22 NOTE — Progress Notes (Signed)
Anesthesia Chart Review   Case: [id number] Date/Time: 06/29/23 0715   Procedure: LAPAROSCOPIC CHOLECYSTECTOMY WITH ICG DYE   Anesthesia type: General   Pre-op diagnosis: SYMPTOMATIC CHOLELITHIASIS   [facility] ROOM 04 / [facility]   Surgeons: Gaynelle Adu, MD       DISCUSSION:62 y.o. some day smoker with h/o HTN, COPD, sleep apnea, CAD (CABG 2008, RCA stent), CHF, PVD, symptomatic cholelithiasis scheduled for above procedure 06/29/2023 with Dr. Gaynelle Adu.   Pt last seen by cardiology 03/29/2023. Per OV note, "According to the Revised Cardiac Risk Index (RCRI), her Perioperative Risk of Major Cardiac Event is (%): 0.4 Her Functional Capacity in METs is: 6.7 according to the Duke Activity Status Index (DASI).  Per office protocol, if patient is without any new symptoms or concerns at the time of their virtual visit, he/she may hold Plavix for 5 days prior to procedure. She is to hold ASA 7 days prior to the procedure. Please resume Plavix and ASA as soon as possible postprocedure, at the discretion of the surgeon.   The patient was advised that if she develops new symptoms prior to surgery to contact our office to arrange for a follow-up visit, and she verbalized understanding. Therefore, based on ACC/AHA guidelines, patient would be at acceptable risk for the planned procedure without further cardiovascular testing. I will route this recommendation to the requesting party via Epic fax function."  Pt last seen by pulmonology 06/19/2023. Stable at this visit.  Awaiting inspire device implantation.  VS: BP (!) 148/103   Pulse 60   Temp 36.6 C (Oral)   Resp 16   Ht 5' 5.5" (1.664 m)   Wt 86.6 kg   LMP 11/16/2011   SpO2 96%   BMI 31.30 kg/m   PROVIDERS: Macy Mis, MD is PCP    LABS: Labs reviewed: Acceptable for surgery. (all labs ordered are listed, but only abnormal results are displayed)  Labs Reviewed  COMPREHENSIVE METABOLIC PANEL - Abnormal; Notable for the following  components:      Result Value   Glucose, Bld 112 (*)    All other components within normal limits  CBC WITH DIFFERENTIAL/PLATELET     IMAGES:   EKG:   CV: Myocardial Perfusion 01/06/2022   No ST deviation was noted.   Left ventricular function is normal. Nuclear stress EF: 62 %. The left ventricular ejection fraction is normal (55-65%). End diastolic cavity size is normal. End systolic cavity size is normal.   Prior study available for comparison from 04/18/2011.   Small inferior basal wall infarct with no ischemia EF preserved 62%  Echo 05/25/2021 1. Left ventricular ejection fraction, by estimation, is 55 to 60%. Left  ventricular ejection fraction by 3D volume is 56 %. The left ventricle has  normal function. The left ventricle has no regional wall motion  abnormalities. Left ventricular diastolic   parameters are indeterminate.   2. Right ventricular systolic function is normal. The right ventricular  size is normal. There is normal pulmonary artery systolic pressure.   3. Left atrial size was mildly dilated.   4. Right atrial size was mildly dilated.   5. The mitral valve is grossly normal. Trivial mitral valve  regurgitation.   6. The aortic valve is grossly normal. Aortic valve regurgitation is not  visualized. No aortic stenosis is present.  Past Medical History:  Diagnosis Date   Allergy    Anemia    takes iron supplement   Arthritis    knees, shoulders  Asthma    Bipolar 1 disorder (HCC)    Blood transfusion without reported diagnosis    Cataract, immature    CHF (congestive heart failure) (HCC)    Clotting disorder (HCC)    COPD (chronic obstructive pulmonary disease) (HCC)    Coronary artery disease    triple vessel   Depression    Dyspnea    Family history of adverse reaction to anesthesia    states father "did not do well" with anesthesia; died last time he had surgery; also had heart disease   Gallstones    GERD (gastroesophageal reflux disease)     occasional - no current med.   Glaucoma    Hammertoe 05/2015   right second toe   Heart murmur    Hepatic steatosis    High cholesterol    History of MI (myocardial infarction)    Hypertension    states under control with meds., has been on med. x 8 yr.   Myocardial infarction (HCC)    Osteoporosis    Panic attacks    Peripheral vascular disease (HCC)    carotid   Renal cyst, left    Schizophrenia (HCC)    Short of breath on exertion    Sleep apnea    Inspire in future   Urinary, incontinence, stress female    Wears dentures    upper   Wears partial dentures    lower    Past Surgical History:  Procedure Laterality Date   C-SECTIONS X 2  1985, 1988   CARDIAC CATHETERIZATION  12/10/2006; 12/18/2006; 07/10/2007; 07/19/2009   CARDIAC CATHETERIZATION  03/04/2010   patent grafts, normal LV function, diffusely diseased RCA (Dr. Erlene Quan)   CARDIAC CATHETERIZATION N/A 02/15/2016   Procedure: Left Heart Cath and Cors/Grafts Angiography;  Surgeon: Peter M Swaziland, MD;  Location: [facility] INVASIVE CV LAB;  Service: Cardiovascular;  Laterality: N/A;   CLUB FOOT RELEASE Bilateral    1st and 2nd toes   COLONOSCOPY     CORONARY ARTERY BYPASS GRAFT  07/16/2007   LIMA to LAD, VG to ramus, VG to PDA    CORONARY STENT INTERVENTION N/A 06/23/2019   Procedure: CORONARY STENT INTERVENTION;  Surgeon: Runell Gess, MD;  Location: [facility] INVASIVE CV LAB;  Service: Cardiovascular;  Laterality: N/A;   DRUG INDUCED ENDOSCOPY N/A 05/17/2023   Procedure: DRUG INDUCED ENDOSCOPY;  Surgeon: Oretha Milch, MD;  Location: [facility] ENDOSCOPY;  Service: Pulmonary;  Laterality: N/A;   FLEXIBLE BRONCHOSCOPY  07/16/2007   HAMMERTOE RECONSTRUCTION WITH WEIL OSTEOTOMY Right 06/03/2015   Procedure: RIGHT SECOND HAMMERTOE CORRECTION WITH MT WEIL OSTEOTOMY;  Surgeon: Toni Arthurs, MD;  Location: [facility];  Service: Orthopedics;  Laterality: Right;   HAND SURGERY Bilateral    1st and 2nd fingers - release of  clubbing   KNEE ARTHROSCOPY WITH ANTERIOR CRUCIATE LIGAMENT (ACL) REPAIR Right 01/30/2008   LEFT HEART CATH AND CORS/GRAFTS ANGIOGRAPHY N/A 06/23/2019   Procedure: LEFT HEART CATH AND CORS/GRAFTS ANGIOGRAPHY;  Surgeon: Runell Gess, MD;  Location: [facility] INVASIVE CV LAB;  Service: Cardiovascular;  Laterality: N/A;   LEFT HEART CATH AND CORS/GRAFTS ANGIOGRAPHY N/A 03/01/2020   Procedure: LEFT HEART CATH AND CORS/GRAFTS ANGIOGRAPHY;  Surgeon: Lyn Records, MD;  Location: [facility] INVASIVE CV LAB;  Service: Cardiovascular;  Laterality: N/A;   NOVASURE ABLATION  12/19/2007   with removal of IUD   ORIF FOOT FRACTURE Left    TOE SURGERIES TO REMOVE NAILS DUE TO CLUBBING  TOTAL KNEE ARTHROPLASTY Right 10/21/2012   Procedure: TOTAL KNEE ARTHROPLASTY;  Surgeon: Shelda Pal, MD;  Location: [facility];  Service: Orthopedics;  Laterality: Right;   UPPER GASTROINTESTINAL ENDOSCOPY      MEDICATIONS:  albuterol (PROVENTIL HFA;VENTOLIN HFA) 108 (90 BASE) MCG/ACT inhaler   Alirocumab (PRALUENT) 150 MG/ML SOAJ   amLODipine (NORVASC) 5 MG tablet   aspirin 81 MG chewable tablet   atorvastatin (LIPITOR) 80 MG tablet   brimonidine (ALPHAGAN) 0.15 % ophthalmic solution   Budeson-Glycopyrrol-Formoterol (BREZTRI AEROSPHERE) 160-9-4.8 MCG/ACT AERO   clopidogrel (PLAVIX) 75 MG tablet   escitalopram (LEXAPRO) 20 MG tablet   fluticasone (FLONASE) 50 MCG/ACT nasal spray   icosapent Ethyl (VASCEPA) 1 g capsule   isosorbide mononitrate (IMDUR) 30 MG 24 hr tablet   lamoTRIgine (LAMICTAL) 100 MG tablet   linaclotide (LINZESS) 145 MCG CAPS capsule   metoprolol tartrate (LOPRESSOR) 25 MG tablet   niacin (NIASPAN) 1000 MG CR tablet   nitroGLYCERIN (NITROSTAT) 0.4 MG SL tablet   nystatin (MYCOSTATIN) 100000 UNIT/ML suspension   pantoprazole (PROTONIX) 40 MG tablet   QUEtiapine (SEROQUEL) 100 MG tablet   ranolazine (RANEXA) 500 MG 12 hr tablet   Vitamin D, Ergocalciferol, (DRISDOL) 1.25 MG (50000 UNIT) CAPS capsule     0.9 %  sodium chloride infusion     Mellody Masri Ward, PA-C [facility] [phone]

## 2023-06-29 ENCOUNTER — Encounter (HOSPITAL_COMMUNITY): Payer: Self-pay | Admitting: General Surgery

## 2023-06-29 ENCOUNTER — Ambulatory Visit (HOSPITAL_COMMUNITY)
Admission: RE | Admit: 2023-06-29 | Discharge: 2023-06-29 | Disposition: A | Discharge status: Home / Self Care | Payer: Medicare Other | Source: Home / Self Care | Attending: General Surgery | Admitting: General Surgery

## 2023-06-29 ENCOUNTER — Ambulatory Visit (HOSPITAL_BASED_OUTPATIENT_CLINIC_OR_DEPARTMENT_OTHER): Payer: Medicare Other | Admitting: Registered Nurse

## 2023-06-29 ENCOUNTER — Ambulatory Visit (HOSPITAL_COMMUNITY): Payer: Medicare Other | Admitting: Physician Assistant

## 2023-06-29 ENCOUNTER — Other Ambulatory Visit (HOSPITAL_COMMUNITY): Payer: Self-pay

## 2023-06-29 ENCOUNTER — Other Ambulatory Visit: Payer: Self-pay

## 2023-06-29 ENCOUNTER — Encounter (HOSPITAL_COMMUNITY)
Admission: RE | Disposition: A | Discharge status: Home / Self Care | Payer: Self-pay | Source: Home / Self Care | Attending: General Surgery

## 2023-06-29 DIAGNOSIS — K8020 Calculus of gallbladder without cholecystitis without obstruction: Secondary | ICD-10-CM

## 2023-06-29 DIAGNOSIS — I252 Old myocardial infarction: Secondary | ICD-10-CM | POA: Insufficient documentation

## 2023-06-29 DIAGNOSIS — Z955 Presence of coronary angioplasty implant and graft: Secondary | ICD-10-CM | POA: Insufficient documentation

## 2023-06-29 DIAGNOSIS — T8131XA Disruption of external operation (surgical) wound, not elsewhere classified, initial encounter: Secondary | ICD-10-CM | POA: Diagnosis not present

## 2023-06-29 DIAGNOSIS — I2510 Atherosclerotic heart disease of native coronary artery without angina pectoris: Secondary | ICD-10-CM | POA: Insufficient documentation

## 2023-06-29 DIAGNOSIS — Z951 Presence of aortocoronary bypass graft: Secondary | ICD-10-CM | POA: Insufficient documentation

## 2023-06-29 DIAGNOSIS — I25708 Atherosclerosis of coronary artery bypass graft(s), unspecified, with other forms of angina pectoris: Secondary | ICD-10-CM

## 2023-06-29 DIAGNOSIS — G4730 Sleep apnea, unspecified: Secondary | ICD-10-CM | POA: Insufficient documentation

## 2023-06-29 DIAGNOSIS — I509 Heart failure, unspecified: Secondary | ICD-10-CM | POA: Insufficient documentation

## 2023-06-29 DIAGNOSIS — R1310 Dysphagia, unspecified: Secondary | ICD-10-CM | POA: Insufficient documentation

## 2023-06-29 DIAGNOSIS — J4489 Other specified chronic obstructive pulmonary disease: Secondary | ICD-10-CM | POA: Insufficient documentation

## 2023-06-29 DIAGNOSIS — K8010 Calculus of gallbladder with chronic cholecystitis without obstruction: Secondary | ICD-10-CM | POA: Insufficient documentation

## 2023-06-29 DIAGNOSIS — I110 Hypertensive heart disease with heart failure: Secondary | ICD-10-CM | POA: Insufficient documentation

## 2023-06-29 DIAGNOSIS — K219 Gastro-esophageal reflux disease without esophagitis: Secondary | ICD-10-CM | POA: Insufficient documentation

## 2023-06-29 DIAGNOSIS — F17210 Nicotine dependence, cigarettes, uncomplicated: Secondary | ICD-10-CM | POA: Insufficient documentation

## 2023-06-29 DIAGNOSIS — F17200 Nicotine dependence, unspecified, uncomplicated: Secondary | ICD-10-CM

## 2023-06-29 DIAGNOSIS — K5909 Other constipation: Secondary | ICD-10-CM | POA: Insufficient documentation

## 2023-06-29 DIAGNOSIS — R112 Nausea with vomiting, unspecified: Secondary | ICD-10-CM | POA: Diagnosis not present

## 2023-06-29 HISTORY — PX: CHOLECYSTECTOMY: SHX55

## 2023-06-29 SURGERY — LAPAROSCOPIC CHOLECYSTECTOMY WITH INTRAOPERATIVE CHOLANGIOGRAM
Anesthesia: General

## 2023-06-29 MED ORDER — ONDANSETRON HCL 4 MG/2ML IJ SOLN
INTRAMUSCULAR | Status: DC | PRN
  Administered 2023-06-29: 4 mg via INTRAVENOUS

## 2023-06-29 MED ORDER — MIDAZOLAM HCL 5 MG/5ML IJ SOLN
INTRAMUSCULAR | Status: DC | PRN
  Administered 2023-06-29: 2 mg via INTRAVENOUS

## 2023-06-29 MED ORDER — OXYCODONE HCL 5 MG PO TABS
ORAL_TABLET | ORAL | Status: AC
  Filled 2023-06-29: qty 1

## 2023-06-29 MED ORDER — AMISULPRIDE (ANTIEMETIC) 5 MG/2ML IV SOLN
10.0000 mg | Freq: Once | INTRAVENOUS | Status: AC | PRN
  Administered 2023-06-29: 10 mg via INTRAVENOUS

## 2023-06-29 MED ORDER — SPY AGENT GREEN - (INDOCYANINE FOR INJECTION)
1.2500 mg | Freq: Once | INTRAMUSCULAR | Status: DC
  Administered 2023-06-29: 1.25 mg via INTRAVENOUS

## 2023-06-29 MED ORDER — ALBUTEROL SULFATE HFA 108 (90 BASE) MCG/ACT IN AERS
INHALATION_SPRAY | RESPIRATORY_TRACT | Status: DC | PRN
Start: 2023-06-29 — End: 2023-06-29
  Administered 2023-06-29: 4 via RESPIRATORY_TRACT

## 2023-06-29 MED ORDER — CIPROFLOXACIN IN D5W 400 MG/200ML IV SOLN
400.0000 mg | INTRAVENOUS | Status: AC
  Administered 2023-06-29: 400 mg via INTRAVENOUS
  Filled 2023-06-29: qty 200

## 2023-06-29 MED ORDER — FENTANYL CITRATE (PF) 100 MCG/2ML IJ SOLN
INTRAMUSCULAR | Status: DC | PRN
  Administered 2023-06-29 (×2): 50 ug via INTRAVENOUS

## 2023-06-29 MED ORDER — DEXMEDETOMIDINE HCL IN NACL 200 MCG/50ML IV SOLN
INTRAVENOUS | Status: DC | PRN
  Administered 2023-06-29 (×2): 8 ug via INTRAVENOUS
  Administered 2023-06-29: 4 ug via INTRAVENOUS
  Administered 2023-06-29 (×2): 8 ug via INTRAVENOUS
  Administered 2023-06-29: 4 ug via INTRAVENOUS

## 2023-06-29 MED ORDER — ROCURONIUM BROMIDE 100 MG/10ML IV SOLN
INTRAVENOUS | Status: DC | PRN
  Administered 2023-06-29: 60 mg via INTRAVENOUS

## 2023-06-29 MED ORDER — ACETAMINOPHEN 10 MG/ML IV SOLN: 1000.0000 mg | Freq: Once | INTRAVENOUS | Status: DC | PRN

## 2023-06-29 MED ORDER — BUPIVACAINE HCL (PF) 0.25 % IJ SOLN
INTRAMUSCULAR | Status: AC
  Filled 2023-06-29: qty 30

## 2023-06-29 MED ORDER — HEMOSTATIC AGENTS (NO CHARGE) OPTIME
TOPICAL | Status: DC | PRN
  Administered 2023-06-29: 1 via TOPICAL

## 2023-06-29 MED ORDER — INDOCYANINE GREEN 25 MG IV SOLR: 1.2500 mg | Freq: Once | INTRAVENOUS | Status: DC

## 2023-06-29 MED ORDER — SUGAMMADEX SODIUM 200 MG/2ML IV SOLN
INTRAVENOUS | Status: DC | PRN
  Administered 2023-06-29: 100 mg via INTRAVENOUS
  Administered 2023-06-29: 200 mg via INTRAVENOUS
  Administered 2023-06-29: 100 mg via INTRAVENOUS

## 2023-06-29 MED ORDER — CLOPIDOGREL BISULFATE 75 MG PO TABS: 75.0000 mg | ORAL_TABLET | Freq: Every day | ORAL | Status: DC

## 2023-06-29 MED ORDER — LIDOCAINE HCL (PF) 2 % IJ SOLN
INTRAMUSCULAR | Status: AC
  Filled 2023-06-29: qty 5

## 2023-06-29 MED ORDER — DEXAMETHASONE SODIUM PHOSPHATE 4 MG/ML IJ SOLN
INTRAMUSCULAR | Status: DC | PRN
  Administered 2023-06-29: 10 mg via INTRAVENOUS

## 2023-06-29 MED ORDER — ONDANSETRON HCL 4 MG/2ML IJ SOLN
INTRAMUSCULAR | Status: AC
  Filled 2023-06-29: qty 2

## 2023-06-29 MED ORDER — OXYCODONE HCL 5 MG PO TABS
5.0000 mg | ORAL_TABLET | Freq: Once | ORAL | Status: AC | PRN
  Administered 2023-06-29: 5 mg via ORAL

## 2023-06-29 MED ORDER — OXYCODONE HCL 5 MG PO TABS
5.0000 mg | ORAL_TABLET | Freq: Four times a day (QID) | ORAL | 0 refills | Status: DC | PRN
  Filled 2023-06-29: qty 15, 4d supply, fill #0

## 2023-06-29 MED ORDER — LACTATED RINGERS IV SOLN: INTRAVENOUS | Status: DC | PRN

## 2023-06-29 MED ORDER — FENTANYL CITRATE PF 50 MCG/ML IJ SOSY
PREFILLED_SYRINGE | INTRAMUSCULAR | Status: AC
  Filled 2023-06-29: qty 1

## 2023-06-29 MED ORDER — ROCURONIUM BROMIDE 10 MG/ML (PF) SYRINGE
PREFILLED_SYRINGE | INTRAVENOUS | Status: AC
  Filled 2023-06-29: qty 10

## 2023-06-29 MED ORDER — FENTANYL CITRATE (PF) 100 MCG/2ML IJ SOLN
INTRAMUSCULAR | Status: AC
  Filled 2023-06-29: qty 2

## 2023-06-29 MED ORDER — PROPOFOL 10 MG/ML IV BOLUS
INTRAVENOUS | Status: DC | PRN
  Administered 2023-06-29: 40 mg via INTRAVENOUS
  Administered 2023-06-29: 160 mg via INTRAVENOUS

## 2023-06-29 MED ORDER — GLYCOPYRROLATE 0.2 MG/ML IJ SOLN
INTRAMUSCULAR | Status: DC | PRN
  Administered 2023-06-29: .2 mg via INTRAVENOUS

## 2023-06-29 MED ORDER — DEXAMETHASONE SODIUM PHOSPHATE 10 MG/ML IJ SOLN
INTRAMUSCULAR | Status: AC
  Filled 2023-06-29: qty 1

## 2023-06-29 MED ORDER — LIDOCAINE HCL (CARDIAC) PF 100 MG/5ML IV SOSY
PREFILLED_SYRINGE | INTRAVENOUS | Status: DC | PRN
  Administered 2023-06-29: 50 mg via INTRAVENOUS

## 2023-06-29 MED ORDER — AMISULPRIDE (ANTIEMETIC) 5 MG/2ML IV SOLN
INTRAVENOUS | Status: AC
  Filled 2023-06-29: qty 4

## 2023-06-29 MED ORDER — CHLORHEXIDINE GLUCONATE CLOTH 2 % EX PADS: 6.0000 | MEDICATED_PAD | Freq: Once | CUTANEOUS | Status: DC

## 2023-06-29 MED ORDER — FENTANYL CITRATE PF 50 MCG/ML IJ SOSY
25.0000 ug | PREFILLED_SYRINGE | INTRAMUSCULAR | Status: DC | PRN
  Administered 2023-06-29 (×2): 50 ug via INTRAVENOUS

## 2023-06-29 MED ORDER — ACETAMINOPHEN 10 MG/ML IV SOLN
INTRAVENOUS | Status: AC
  Filled 2023-06-29: qty 100

## 2023-06-29 MED ORDER — GLYCOPYRROLATE 0.2 MG/ML IJ SOLN
INTRAMUSCULAR | Status: AC
  Filled 2023-06-29: qty 1

## 2023-06-29 MED ORDER — FENTANYL CITRATE PF 50 MCG/ML IJ SOSY
PREFILLED_SYRINGE | INTRAMUSCULAR | Status: AC
  Filled 2023-06-29: qty 2

## 2023-06-29 MED ORDER — OXYCODONE HCL 5 MG/5ML PO SOLN: 5.0000 mg | Freq: Once | ORAL | Status: AC | PRN

## 2023-06-29 MED ORDER — DEXMEDETOMIDINE HCL IN NACL 80 MCG/20ML IV SOLN
INTRAVENOUS | Status: AC
  Filled 2023-06-29: qty 20

## 2023-06-29 MED ORDER — ACETAMINOPHEN 500 MG PO TABS: 1000.0000 mg | ORAL_TABLET | Freq: Three times a day (TID) | ORAL | Status: AC

## 2023-06-29 MED ORDER — PROPOFOL 10 MG/ML IV BOLUS
INTRAVENOUS | Status: AC
  Filled 2023-06-29: qty 20

## 2023-06-29 MED ORDER — ACETAMINOPHEN 10 MG/ML IV SOLN
INTRAVENOUS | Status: DC | PRN
  Administered 2023-06-29: 1000 mg via INTRAVENOUS

## 2023-06-29 MED ORDER — BUPIVACAINE HCL (PF) 0.25 % IJ SOLN
INTRAMUSCULAR | Status: DC | PRN
  Administered 2023-06-29: 28 mL

## 2023-06-29 MED ORDER — MIDAZOLAM HCL 2 MG/2ML IJ SOLN
INTRAMUSCULAR | Status: AC
  Filled 2023-06-29: qty 2

## 2023-06-29 MED ORDER — TRAMADOL HCL 50 MG PO TABS
50.0000 mg | ORAL_TABLET | Freq: Four times a day (QID) | ORAL | 0 refills | Status: AC | PRN
  Filled 2023-06-29 (×2): qty 15, 4d supply, fill #0

## 2023-06-29 MED ORDER — LACTATED RINGERS IR SOLN
Status: DC | PRN
  Administered 2023-06-29: 1000 mL

## 2023-06-29 SURGICAL SUPPLY — 56 items
ADH SKN CLS APL DERMABOND .7 (GAUZE/BANDAGES/DRESSINGS)
APL PRP STRL LF DISP 70% ISPRP (MISCELLANEOUS) ×1
APL SRG 38 LTWT LNG FL B (MISCELLANEOUS)
APPLICATOR ARISTA FLEXITIP XL (MISCELLANEOUS) IMPLANT
APPLIER CLIP 5 13 M/L LIGAMAX5 (MISCELLANEOUS) ×1
APPLIER CLIP ROT 10 11.4 M/L (STAPLE)
APR CLP MED LRG 11.4X10 (STAPLE)
APR CLP MED LRG 5 ANG JAW (MISCELLANEOUS) ×1
BAG COUNTER SPONGE SURGICOUNT (BAG) IMPLANT
BAG SPEC RTRVL 10 TROC 200 (ENDOMECHANICALS) ×1
BAG SPNG CNTER NS LX DISP (BAG)
CABLE HIGH FREQUENCY MONO STRZ (ELECTRODE) ×1 IMPLANT
CHLORAPREP W/TINT 26 (MISCELLANEOUS) ×1 IMPLANT
CLIP APPLIE 5 13 M/L LIGAMAX5 (MISCELLANEOUS) IMPLANT
CLIP APPLIE ROT 10 11.4 M/L (STAPLE) IMPLANT
CLIP LIGATING HEMO O LOK GREEN (MISCELLANEOUS) IMPLANT
COVER MAYO STAND XLG (MISCELLANEOUS) IMPLANT
COVER SURGICAL LIGHT HANDLE (MISCELLANEOUS) ×1 IMPLANT
DERMABOND ADVANCED .7 DNX12 (GAUZE/BANDAGES/DRESSINGS) IMPLANT
DRAPE C-ARM 42X120 X-RAY (DRAPES) IMPLANT
DRSG TEGADERM 2-3/8X2-3/4 SM (GAUZE/BANDAGES/DRESSINGS) ×3 IMPLANT
DRSG TEGADERM 4X4.75 (GAUZE/BANDAGES/DRESSINGS) ×1 IMPLANT
ELECT REM PT RETURN 15FT ADLT (MISCELLANEOUS) ×1 IMPLANT
GAUZE SPONGE 2X2 8PLY STRL LF (GAUZE/BANDAGES/DRESSINGS) ×1 IMPLANT
GLOVE BIO SURGEON STRL SZ7.5 (GLOVE) ×1 IMPLANT
GLOVE INDICATOR 8.0 STRL GRN (GLOVE) ×1 IMPLANT
GOWN STRL REUS W/ TWL XL LVL3 (GOWN DISPOSABLE) ×1 IMPLANT
GOWN STRL REUS W/TWL XL LVL3 (GOWN DISPOSABLE) ×1
GRASPER SUT TROCAR 14GX15 (MISCELLANEOUS) IMPLANT
HEMOSTAT ARISTA ABSORB 3G PWDR (HEMOSTASIS) IMPLANT
HEMOSTAT SNOW SURGICEL 2X4 (HEMOSTASIS) IMPLANT
IRRIG SUCT STRYKERFLOW 2 WTIP (MISCELLANEOUS) ×1
IRRIGATION SUCT STRKRFLW 2 WTP (MISCELLANEOUS) ×1 IMPLANT
KIT BASIN OR (CUSTOM PROCEDURE TRAY) ×1 IMPLANT
KIT TURNOVER KIT A (KITS) IMPLANT
L-HOOK LAP DISP 36CM (ELECTROSURGICAL)
LHOOK LAP DISP 36CM (ELECTROSURGICAL) IMPLANT
PACK SPY-PHI (KITS) IMPLANT
POUCH RETRIEVAL ECOSAC 10 (ENDOMECHANICALS) ×1 IMPLANT
SCISSORS LAP 5X35 DISP (ENDOMECHANICALS) ×1 IMPLANT
SET CHOLANGIOGRAPH MIX (MISCELLANEOUS) IMPLANT
SET TUBE SMOKE EVAC HIGH FLOW (TUBING) ×1 IMPLANT
SLEEVE ADV FIXATION 5X100MM (TROCAR) ×1 IMPLANT
SPIKE FLUID TRANSFER (MISCELLANEOUS) ×1 IMPLANT
STRIP CLOSURE SKIN 1/2X4 (GAUZE/BANDAGES/DRESSINGS) ×1 IMPLANT
SUT MNCRL AB 4-0 PS2 18 (SUTURE) ×1 IMPLANT
SUT VIC AB 0 UR5 27 (SUTURE) IMPLANT
SUT VICRYL 0 TIES 12 18 (SUTURE) IMPLANT
SUT VICRYL 0 UR6 27IN ABS (SUTURE) IMPLANT
TOWEL OR 17X26 10 PK STRL BLUE (TOWEL DISPOSABLE) ×1 IMPLANT
TOWEL OR NON WOVEN STRL DISP B (DISPOSABLE) ×1 IMPLANT
TRAY LAPAROSCOPIC (CUSTOM PROCEDURE TRAY) ×1 IMPLANT
TROCAR ADV FIXATION 12X100MM (TROCAR) IMPLANT
TROCAR ADV FIXATION 5X100MM (TROCAR) ×1 IMPLANT
TROCAR BALLN 12MMX100 BLUNT (TROCAR) IMPLANT
TROCAR XCEL NON-BLD 5MMX100MML (ENDOMECHANICALS) IMPLANT

## 2023-06-29 NOTE — Transfer of Care (Signed)
Immediate Anesthesia Transfer of Care Note  Patient: [name]  Procedure(s) Performed: LAPAROSCOPIC CHOLECYSTECTOMY WITH ICG DYE  Patient Location: PACU  Anesthesia Type:General  Level of Consciousness: drowsy and patient cooperative  Airway & Oxygen Therapy: Patient Spontanous Breathing and Patient connected to nasal cannula oxygen  Post-op Assessment: Report given to RN and Patient moving all extremities  Post vital signs: Reviewed and stable  Last Vitals:  Vitals Value Taken Time  BP 151/87 06/29/23 0854  Temp    Pulse 60 06/29/23 0854  Resp 19 06/29/23 0854  SpO2 92 % 06/29/23 0854  Vitals shown include unfiled device data.  Last Pain: There were no vitals filed for this visit.       Complications: No notable events documented.

## 2023-06-29 NOTE — Anesthesia Procedure Notes (Signed)
Procedure Name: Intubation Date/Time: 06/29/2023 7:37 AM  Performed by: Lily Lovings, CRNAPre-anesthesia Checklist: Patient identified, Patient being monitored, Timeout performed, Emergency Drugs available and Suction available Patient Re-evaluated:Patient Re-evaluated prior to induction Oxygen Delivery Method: Circle system utilized Preoxygenation: Pre-oxygenation with 100% oxygen Induction Type: IV induction Ventilation: Mask ventilation without difficulty Laryngoscope Size: Mac and 3 Grade View: Grade I Tube type: Oral Tube size: 7.0 mm Number of attempts: 1 Airway Equipment and Method: Stylet Placement Confirmation: ETT inserted through vocal cords under direct vision, positive ETCO2 and breath sounds checked- equal and bilateral Secured at: 21 cm Tube secured with: Tape Dental Injury: Teeth and Oropharynx as per pre-operative assessment

## 2023-06-29 NOTE — Anesthesia Postprocedure Evaluation (Signed)
Anesthesia Post Note  Patient: [name]  Procedure(s) Performed: LAPAROSCOPIC CHOLECYSTECTOMY WITH ICG DYE     Patient location during evaluation: PACU Anesthesia Type: General Level of consciousness: awake and alert Pain management: pain level controlled Vital Signs Assessment: post-procedure vital signs reviewed and stable Respiratory status: spontaneous breathing, nonlabored ventilation, respiratory function stable and patient connected to nasal cannula oxygen Cardiovascular status: blood pressure returned to baseline and stable Postop Assessment: no apparent nausea or vomiting Anesthetic complications: no   No notable events documented.  Last Vitals:  Vitals:   06/29/23 0905 06/29/23 0915  BP: (!) 161/89 (!) 148/79  Pulse: 63 (!) 56  Resp: 18 (!) 21  Temp:    SpO2: 98% 92%    Last Pain:  Vitals:   06/29/23 0915  PainSc: Asleep                 [place]

## 2023-06-29 NOTE — H&P (Signed)
CC: here for surgery  Requesting provider: n/a  HPI: [name] is an 62 y.o. female who is here for lap chole with icg dye.  Denies any changes since seen in clinic.   Old hpiL at the request of Dr. Cristi Loron for evaluation of New Consultation ( Gallstones,) .  SHe reports a several year history of right upper quadrant pain. It is on a daily basis. It will worsen after eating. Certain foods will exacerbate it. Surprisingly she states her all vegetables will make it worse. The increase in intensity when it occurs will last minutes to hours. Sometimes the pain radiates to her right side and shoulder. She has a fair amount of nausea. She also complains of upper abdominal swelling or bloating after eating. She has chronic constipation and takes Linzess. She also reports episodes of a choking sensation or feeling like something gets stuck in her chest when eating. It does not occur daily. Occurs more with solids than liquids. She has had an upper endoscopy, esophagram. She had a remote CT a few years ago that showed gallstones. She has had numerous heart procedures. She does have shortness of breath, dyspnea on exertion. She does take Plavix. She had a cardiac stress test last year which was reviewed below. No prior abdominal surgeries.  Does smoke she was smoking 3 packs she is down to about 1 pack over the course of the entire month   Past Medical History:  Diagnosis Date   Allergy    Anemia    takes iron supplement   Arthritis    knees, shoulders   Asthma    Bipolar 1 disorder (HCC)    Blood transfusion without reported diagnosis    Cataract, immature    CHF (congestive heart failure) (HCC)    Clotting disorder (HCC)    COPD (chronic obstructive pulmonary disease) (HCC)    Coronary artery disease    triple vessel   Depression    Dyspnea    Family history of adverse reaction to anesthesia    states father "did not do well" with anesthesia; died last time he had surgery; also had  heart disease   Gallstones    GERD (gastroesophageal reflux disease)    occasional - no current med.   Glaucoma    Hammertoe 05/2015   right second toe   Heart murmur    Hepatic steatosis    High cholesterol    History of MI (myocardial infarction)    Hypertension    states under control with meds., has been on med. x 8 yr.   Myocardial infarction (HCC)    Osteoporosis    Panic attacks    Peripheral vascular disease (HCC)    carotid   Renal cyst, left    Schizophrenia (HCC)    Short of breath on exertion    Sleep apnea    Inspire in future   Urinary, incontinence, stress female    Wears dentures    upper   Wears partial dentures    lower    Past Surgical History:  Procedure Laterality Date   C-SECTIONS X 2  1985, 1988   CARDIAC CATHETERIZATION  12/10/2006; 12/18/2006; 07/10/2007; 07/19/2009   CARDIAC CATHETERIZATION  03/04/2010   patent grafts, normal LV function, diffusely diseased RCA (Dr. Erlene Quan)   CARDIAC CATHETERIZATION N/A 02/15/2016   Procedure: Left Heart Cath and Cors/Grafts Angiography;  Surgeon: Peter M Swaziland, MD;  Location: [facility] INVASIVE CV LAB;  Service: Cardiovascular;  Laterality: N/A;  CLUB FOOT RELEASE Bilateral    1st and 2nd toes   COLONOSCOPY     CORONARY ARTERY BYPASS GRAFT  07/16/2007   LIMA to LAD, VG to ramus, VG to PDA    CORONARY STENT INTERVENTION N/A 06/23/2019   Procedure: CORONARY STENT INTERVENTION;  Surgeon: Runell Gess, MD;  Location: [facility] INVASIVE CV LAB;  Service: Cardiovascular;  Laterality: N/A;   DRUG INDUCED ENDOSCOPY N/A 05/17/2023   Procedure: DRUG INDUCED ENDOSCOPY;  Surgeon: Oretha Milch, MD;  Location: [facility] ENDOSCOPY;  Service: Pulmonary;  Laterality: N/A;   FLEXIBLE BRONCHOSCOPY  07/16/2007   HAMMERTOE RECONSTRUCTION WITH WEIL OSTEOTOMY Right 06/03/2015   Procedure: RIGHT SECOND HAMMERTOE CORRECTION WITH MT WEIL OSTEOTOMY;  Surgeon: Toni Arthurs, MD;  Location:  SURGERY CENTER;  Service: Orthopedics;   Laterality: Right;   HAND SURGERY Bilateral    1st and 2nd fingers - release of clubbing   KNEE ARTHROSCOPY WITH ANTERIOR CRUCIATE LIGAMENT (ACL) REPAIR Right 01/30/2008   LEFT HEART CATH AND CORS/GRAFTS ANGIOGRAPHY N/A 06/23/2019   Procedure: LEFT HEART CATH AND CORS/GRAFTS ANGIOGRAPHY;  Surgeon: Runell Gess, MD;  Location: [facility] INVASIVE CV LAB;  Service: Cardiovascular;  Laterality: N/A;   LEFT HEART CATH AND CORS/GRAFTS ANGIOGRAPHY N/A 03/01/2020   Procedure: LEFT HEART CATH AND CORS/GRAFTS ANGIOGRAPHY;  Surgeon: Lyn Records, MD;  Location: [facility] INVASIVE CV LAB;  Service: Cardiovascular;  Laterality: N/A;   NOVASURE ABLATION  12/19/2007   with removal of IUD   ORIF FOOT FRACTURE Left    TOE SURGERIES TO REMOVE NAILS DUE TO CLUBBING     TOTAL KNEE ARTHROPLASTY Right 10/21/2012   Procedure: TOTAL KNEE ARTHROPLASTY;  Surgeon: Shelda Pal, MD;  Location: [facility] ORS;  Service: Orthopedics;  Laterality: Right;   UPPER GASTROINTESTINAL ENDOSCOPY      Family History  Problem Relation Age of Onset   Lung cancer Mother    Kidney cancer Mother    Heart disease Father    Anesthesia problems Father        states "did not do well" with anesthesia   Colon polyps Sister    Breast cancer Maternal Aunt    Breast cancer Maternal Aunt    Stomach cancer Neg Hx    Esophageal cancer Neg Hx    Pancreatic cancer Neg Hx    Rectal cancer Neg Hx    Colon cancer Neg Hx     Social:  reports that she has been smoking cigarettes. She started smoking about 37 years ago. She has a 75.7 pack-year smoking history. She has never used smokeless tobacco. She reports that she does not drink alcohol and does not use drugs.  Allergies:  Allergies  Allergen Reactions   Benadryl [Diphenhydramine Hcl] Shortness Of Breath and Swelling    Tongue swelling   Codeine Other (See Comments)    "makes me psychotic"    Haldol [Haloperidol Decanoate] Anaphylaxis   Amoxicillin Hives and Swelling    Swelling to  extremities Did it involve swelling of the face/tongue/throat, SOB, or low BP? Yes Did it involve sudden or severe rash/hives, skin peeling, or any reaction on the inside of your mouth or nose? Yes Did you need to seek medical attention at a hospital or doctor's office? Yes When did it last happen?    young adult   If all above answers are "NO", may proceed with cephalosporin use.   Bee Venom Swelling and Other (See Comments)    "pimples" from head to toe and whole  body swelling    Medications: I have reviewed the patient's current medications.   ROS - all of the below systems have been reviewed with the patient and positives are indicated with bold text General: chills, fever or night sweats Eyes: blurry vision or double vision ENT: epistaxis or sore throat Allergy/Immunology: itchy/watery eyes or nasal congestion Hematologic/Lymphatic: bleeding problems, blood clots or swollen lymph nodes Endocrine: temperature intolerance or unexpected weight changes Breast: new or changing breast lumps or nipple discharge Resp: cough, shortness of breath, or wheezing CV: chest pain or dyspnea on exertion GI: as per HPI GU: dysuria, trouble voiding, or hematuria MSK: joint pain or joint stiffness Neuro: TIA or stroke symptoms Derm: pruritus and skin lesion changes Psych: anxiety and depression  PE Blood pressure (!) 146/77, pulse (!) 54, temperature 98.3 F (36.8 C), resp. rate 16, height 5' 5.5" (1.664 m), weight 87.5 kg, last menstrual period 11/16/2011, SpO2 96%. Constitutional: NAD; conversant; no deformities Eyes: Moist conjunctiva; no lid lag; anicteric; PERRL Neck: Trachea midline; no thyromegaly Lungs: Normal respiratory effort; no tactile fremitus CV: RRR; no palpable thrills; no pitting edema GI: Abd soft, nt, nd; no palpable hepatosplenomegaly MSK: Normal gait; no clubbing/cyanosis Psychiatric: Appropriate affect; alert and oriented x3 Lymphatic: No palpable cervical or axillary  lymphadenopathy Skin:no rash  No results found for this or any previous visit (from the past 48 hour(s)).  No results found.  Imaging: reviewed  A/P: ELENIE KONTOS is an 62 y.o. female with  Symptomatic cholelithiasis  Dysphagia, unspecified type  Chronic constipation  CAD S/P percutaneous coronary angioplasty  Antiplatelet or antithrombotic long-term use   To OR for lap chole with ICG dye Eras proctocol IV abx All questions asked and answered  Mary Sella. Andrey Campanile, MD, FACS General, Bariatric, & Minimally Invasive Surgery [facility]

## 2023-06-29 NOTE — Discharge Instructions (Signed)
RESUME PLAVIX ON SUNDAY 11/10  LAPAROSCOPIC SURGERY: POST OP INSTRUCTIONS Always review your discharge instruction sheet given to you by the facility where your surgery was performed. IF YOU HAVE DISABILITY OR FAMILY LEAVE FORMS, YOU MUST BRING THEM TO THE OFFICE FOR PROCESSING.   DO NOT GIVE THEM TO YOUR DOCTOR.  PAIN CONTROL  First take acetaminophen (Tylenol) AND/or ibuprofen (Advil) to control your pain after surgery.  Follow directions on package.  Taking acetaminophen (Tylenol) and/or ibuprofen (Advil) regularly after surgery will help to control your pain and lower the amount of prescription pain medication you may need.  You should not take more than 3,000 mg (3 grams) of acetaminophen (Tylenol) in 24 hours.  You should not take ibuprofen (Advil), aleve, motrin, naprosyn or other NSAIDS if you have a history of stomach ulcers or chronic kidney disease.  A prescription for pain medication may be given to you upon discharge.  Take your pain medication as prescribed, if you still have uncontrolled pain after taking acetaminophen (Tylenol) or ibuprofen (Advil). Use ice packs to help control pain. If you need a refill on your pain medication, please contact your pharmacy.  They will contact our office to request authorization. Prescriptions will not be filled after 5pm or on week-ends.  HOME MEDICATIONS Take your usually prescribed medications unless otherwise directed.  DIET You should follow a light diet the first few days after arrival home.  Be sure to include lots of fluids daily. Avoid fatty, fried foods.   CONSTIPATION It is common to experience some constipation after surgery and if you are taking pain medication.  Increasing fluid intake and taking a stool softener (such as Colace) will usually help or prevent this problem from occurring.  A mild laxative (Milk of Magnesia or Miralax) should be taken according to package instructions if there are no bowel movements after 48  hours.  WOUND/INCISION CARE Most patients will experience some swelling and bruising in the area of the incisions.  Ice packs will help.  Swelling and bruising can take several days to resolve.  Unless discharge instructions indicate otherwise, follow guidelines below  STERI-STRIPS - you may remove your outer bandages 48 hours after surgery, and you may shower at that time.  You have steri-strips (small skin tapes) in place directly over the incision.  These strips should be left on the skin for 7-10 days.   DERMABOND/SKIN GLUE - you may shower in 24 hours.  The glue will flake off over the next 2-3 weeks. Any sutures or staples will be removed at the office during your follow-up visit.  ACTIVITIES You may resume regular (light) daily activities beginning the next day--such as daily self-care, walking, climbing stairs--gradually increasing activities as tolerated.  You may have sexual intercourse when it is comfortable.  Refrain from any heavy lifting or straining until approved by your doctor. You may drive when you are no longer taking prescription pain medication, you can comfortably wear a seatbelt, and you can safely maneuver your car and apply brakes.  FOLLOW-UP You should see your doctor in the office for a follow-up appointment approximately 2-3 weeks after your surgery.  You should have been given your post-op/follow-up appointment when your surgery was scheduled.  If you did not receive a post-op/follow-up appointment, make sure that you call for this appointment within a day or two after you arrive home to insure a convenient appointment time.  OTHER INSTRUCTIONS   WHEN TO CALL YOUR DOCTOR: Fever over 101.0 Inability to urinate  Continued bleeding from incision. Increased pain, redness, or drainage from the incision. Increasing abdominal pain  The clinic staff is available to answer your questions during regular business hours.  Please don't hesitate to call and ask to speak to  one of the nurses for clinical concerns.  If you have a medical emergency, go to the nearest emergency room or call 911.  A surgeon from [facility] is always on call at the hospital. [address], [place], [place]  [address] ? [address] [address], [place], [place]   [address] [phone] ? [phone] ? FAX [phone] Web site: [email or web address]

## 2023-06-29 NOTE — Op Note (Signed)
HAE JEWETT [id number] Nov 05, 1960 06/29/2023  Laparoscopic Cholecystectomy with near infrared fluorescent cholangiography procedure Note  Indications: This patient presents with symptomatic gallbladder disease and will undergo laparoscopic cholecystectomy.  Pre-operative Diagnosis: symptomatic cholelithiasis  Post-operative Diagnosis: Same  Surgeon: Gaynelle Adu MD FACS  Assistants: none  Anesthesia: General endotracheal anesthesia  Procedure Details  The patient was seen again in the Holding Room. The risks, benefits, complications, treatment options, and expected outcomes were discussed with the patient. The possibilities of reaction to medication, pulmonary aspiration, perforation of viscus, bleeding, recurrent infection, finding a normal gallbladder, the need for additional procedures, failure to diagnose a condition, the possible need to convert to an open procedure, and creating a complication requiring transfusion or operation were discussed with the patient. The likelihood of improving the patient's symptoms with return to their baseline status is good.  The patient and/or family concurred with the proposed plan, giving informed consent. The site of surgery properly noted. The patient was taken to Operating Room, identified as AYLINN MEHLE and the procedure verified as Laparoscopic Cholecystectomy with ICG dye.  A Time Out was held and the above information confirmed. Antibiotic prophylaxis was administered.    ICG dye was administered preoperatively.    General endotracheal anesthesia was then administered and tolerated well. After the induction, the abdomen was prepped with Chloraprep and draped in the sterile fashion. The patient was positioned in the supine position.  Local anesthetic agent was injected into the skin near the umbilicus and an incision made. We dissected down to the abdominal fascia with blunt dissection.  The fascia was incised vertically and we entered  the peritoneal cavity bluntly.  A pursestring suture of 0-Vicryl was placed around the fascial opening.  The patient had very thin attenuated fascia.  the Hasson cannula was inserted and secured with the stay suture.  Pneumoperitoneum was then created with CO2 and tolerated well without any adverse changes in the patient's vital signs. An 5-mm port was placed in the subxiphoid position.  Two 5-mm ports were placed in the right upper quadrant. All skin incisions were infiltrated with a local anesthetic agent before making the incision and placing the trocars.   We positioned the patient in reverse Trendelenburg, tilted slightly to the patient's left.  The gallbladder was identified, the fundus grasped and retracted cephalad. Adhesions were lysed bluntly and with the electrocautery where indicated, taking care not to injure any adjacent organs or viscus. The infundibulum was grasped and retracted laterally, exposing the peritoneum overlying the triangle of Calot. This was then divided and exposed in a blunt fashion. A critical view of the cystic duct and cystic artery was obtained.  The cystic duct was clearly identified and bluntly dissected circumferentially.  Utilizing the St[employer]ker camera system near infrared fluorescent activity was visualized in the liver, cystic duct, common hepatic duct and common bile duct and small bowel.  This served as a secondary confirmation of our anatomy.  The cystic duct was then ligated with clips and divided. The cystic artery which had been identified & dissected free was ligated with clips and divided as well.   The gallbladder was dissected from the liver bed in retrograde fashion with the electrocautery.  There was some spillage of bile from the gallbladder itself but no stone spillage.  The gallbladder was removed and placed in an Ecco sac.  The gallbladder and Ecco sac were then removed through the umbilical port site. The liver bed was irrigated and inspected.  The  gallbladder  fossa was oozy but hemostasis was achieved with the electrocautery. Copious irrigation was utilized and was repeatedly aspirated until clear.  Did elect to place a piece of surgical snow in the gallbladder fossa.  The pursestring suture was used to close the umbilical fascia.  Given how attenuated her fascia was I placed 2 additional interrupted 0 Vicryl sutures using PMI suture passer with laparoscopic guidance.  We again inspected the right upper quadrant for hemostasis.  The umbilical closure was inspected and there was no air leak and nothing trapped within the closure. Pneumoperitoneum was released as we removed the trocars.  4-0 Monocryl was used to close the skin.   Dermabond was applied applied. The patient was then extubated and brought to the recovery room in stable condition. Instrument, sponge, and needle counts were correct at closure and at the conclusion of the case.   Findings: Cholelithiasis Positive critical view Positive snow Attenuated fascia Patient had skin rash underneath both breast preoperatively  Estimated Blood Loss: Minimal         Drains: none         Specimens: Gallbladder           Complications: None; patient tolerated the procedure well.         Disposition: PACU - hemodynamically stable.         Condition: stable  Mary Sella. Andrey Campanile, MD, FACS General, Bariatric, & Minimally Invasive Surgery [facility],  [facility]

## 2023-06-30 ENCOUNTER — Encounter (HOSPITAL_COMMUNITY): Payer: Self-pay | Admitting: General Surgery

## 2023-06-30 ENCOUNTER — Inpatient Hospital Stay (HOSPITAL_COMMUNITY)
Admission: EM | Admit: 2023-06-30 | Discharge: 2023-07-03 | DRG: 908 | Disposition: A | Discharge status: Home / Self Care | Payer: Medicare Other | Attending: Surgery | Admitting: Surgery

## 2023-06-30 ENCOUNTER — Emergency Department (HOSPITAL_COMMUNITY): Payer: Medicare Other

## 2023-06-30 ENCOUNTER — Other Ambulatory Visit: Payer: Self-pay

## 2023-06-30 DIAGNOSIS — E7800 Pure hypercholesterolemia, unspecified: Secondary | ICD-10-CM | POA: Diagnosis present

## 2023-06-30 DIAGNOSIS — I509 Heart failure, unspecified: Secondary | ICD-10-CM | POA: Diagnosis present

## 2023-06-30 DIAGNOSIS — M810 Age-related osteoporosis without current pathological fracture: Secondary | ICD-10-CM | POA: Diagnosis present

## 2023-06-30 DIAGNOSIS — R112 Nausea with vomiting, unspecified: Secondary | ICD-10-CM | POA: Diagnosis present

## 2023-06-30 DIAGNOSIS — Z8249 Family history of ischemic heart disease and other diseases of the circulatory system: Secondary | ICD-10-CM | POA: Diagnosis not present

## 2023-06-30 DIAGNOSIS — I739 Peripheral vascular disease, unspecified: Secondary | ICD-10-CM | POA: Diagnosis present

## 2023-06-30 DIAGNOSIS — K91870 Postprocedural hematoma of a digestive system organ or structure following a digestive system procedure: Secondary | ICD-10-CM | POA: Diagnosis present

## 2023-06-30 DIAGNOSIS — J4489 Other specified chronic obstructive pulmonary disease: Secondary | ICD-10-CM | POA: Diagnosis present

## 2023-06-30 DIAGNOSIS — Z96651 Presence of right artificial knee joint: Secondary | ICD-10-CM | POA: Diagnosis present

## 2023-06-30 DIAGNOSIS — Z880 Allergy status to penicillin: Secondary | ICD-10-CM

## 2023-06-30 DIAGNOSIS — K760 Fatty (change of) liver, not elsewhere classified: Secondary | ICD-10-CM | POA: Diagnosis present

## 2023-06-30 DIAGNOSIS — M19011 Primary osteoarthritis, right shoulder: Secondary | ICD-10-CM | POA: Diagnosis present

## 2023-06-30 DIAGNOSIS — Z7982 Long term (current) use of aspirin: Secondary | ICD-10-CM

## 2023-06-30 DIAGNOSIS — Z9049 Acquired absence of other specified parts of digestive tract: Principal | ICD-10-CM

## 2023-06-30 DIAGNOSIS — I25810 Atherosclerosis of coronary artery bypass graft(s) without angina pectoris: Secondary | ICD-10-CM | POA: Diagnosis present

## 2023-06-30 DIAGNOSIS — R21 Rash and other nonspecific skin eruption: Secondary | ICD-10-CM | POA: Diagnosis present

## 2023-06-30 DIAGNOSIS — Z888 Allergy status to other drugs, medicaments and biological substances status: Secondary | ICD-10-CM

## 2023-06-30 DIAGNOSIS — D649 Anemia, unspecified: Principal | ICD-10-CM | POA: Diagnosis present

## 2023-06-30 DIAGNOSIS — D689 Coagulation defect, unspecified: Secondary | ICD-10-CM | POA: Diagnosis present

## 2023-06-30 DIAGNOSIS — F319 Bipolar disorder, unspecified: Secondary | ICD-10-CM | POA: Diagnosis present

## 2023-06-30 DIAGNOSIS — I2489 Other forms of acute ischemic heart disease: Secondary | ICD-10-CM | POA: Diagnosis not present

## 2023-06-30 DIAGNOSIS — H409 Unspecified glaucoma: Secondary | ICD-10-CM | POA: Diagnosis present

## 2023-06-30 DIAGNOSIS — R509 Fever, unspecified: Secondary | ICD-10-CM | POA: Diagnosis present

## 2023-06-30 DIAGNOSIS — I7102 Dissection of abdominal aorta: Secondary | ICD-10-CM

## 2023-06-30 DIAGNOSIS — Z7902 Long term (current) use of antithrombotics/antiplatelets: Secondary | ICD-10-CM

## 2023-06-30 DIAGNOSIS — I2510 Atherosclerotic heart disease of native coronary artery without angina pectoris: Secondary | ICD-10-CM | POA: Diagnosis present

## 2023-06-30 DIAGNOSIS — T819XXA Unspecified complication of procedure, initial encounter: Secondary | ICD-10-CM | POA: Diagnosis present

## 2023-06-30 DIAGNOSIS — F209 Schizophrenia, unspecified: Secondary | ICD-10-CM | POA: Diagnosis present

## 2023-06-30 DIAGNOSIS — Y836 Removal of other organ (partial) (total) as the cause of abnormal reaction of the patient, or of later complication, without mention of misadventure at the time of the procedure: Secondary | ICD-10-CM | POA: Diagnosis present

## 2023-06-30 DIAGNOSIS — Z955 Presence of coronary angioplasty implant and graft: Secondary | ICD-10-CM | POA: Diagnosis not present

## 2023-06-30 DIAGNOSIS — Z7951 Long term (current) use of inhaled steroids: Secondary | ICD-10-CM

## 2023-06-30 DIAGNOSIS — I252 Old myocardial infarction: Secondary | ICD-10-CM

## 2023-06-30 DIAGNOSIS — F410 Panic disorder [episodic paroxysmal anxiety] without agoraphobia: Secondary | ICD-10-CM | POA: Diagnosis present

## 2023-06-30 DIAGNOSIS — K219 Gastro-esophageal reflux disease without esophagitis: Secondary | ICD-10-CM | POA: Diagnosis present

## 2023-06-30 DIAGNOSIS — R079 Chest pain, unspecified: Secondary | ICD-10-CM | POA: Diagnosis not present

## 2023-06-30 DIAGNOSIS — F17210 Nicotine dependence, cigarettes, uncomplicated: Secondary | ICD-10-CM | POA: Diagnosis present

## 2023-06-30 DIAGNOSIS — K8020 Calculus of gallbladder without cholecystitis without obstruction: Secondary | ICD-10-CM | POA: Diagnosis present

## 2023-06-30 DIAGNOSIS — K5909 Other constipation: Secondary | ICD-10-CM | POA: Diagnosis present

## 2023-06-30 DIAGNOSIS — I110 Hypertensive heart disease with heart failure: Secondary | ICD-10-CM | POA: Diagnosis present

## 2023-06-30 DIAGNOSIS — M19012 Primary osteoarthritis, left shoulder: Secondary | ICD-10-CM | POA: Diagnosis present

## 2023-06-30 DIAGNOSIS — Z79899 Other long term (current) drug therapy: Secondary | ICD-10-CM | POA: Diagnosis not present

## 2023-06-30 DIAGNOSIS — T8131XA Disruption of external operation (surgical) wound, not elsewhere classified, initial encounter: Secondary | ICD-10-CM | POA: Diagnosis present

## 2023-06-30 DIAGNOSIS — R7989 Other specified abnormal findings of blood chemistry: Secondary | ICD-10-CM | POA: Diagnosis not present

## 2023-06-30 DIAGNOSIS — G4733 Obstructive sleep apnea (adult) (pediatric): Secondary | ICD-10-CM | POA: Diagnosis present

## 2023-06-30 DIAGNOSIS — I200 Unstable angina: Secondary | ICD-10-CM | POA: Diagnosis not present

## 2023-06-30 DIAGNOSIS — I214 Non-ST elevation (NSTEMI) myocardial infarction: Secondary | ICD-10-CM

## 2023-06-30 DIAGNOSIS — Z885 Allergy status to narcotic agent status: Secondary | ICD-10-CM

## 2023-06-30 DIAGNOSIS — Z91030 Bee allergy status: Secondary | ICD-10-CM

## 2023-06-30 DIAGNOSIS — R1310 Dysphagia, unspecified: Secondary | ICD-10-CM | POA: Diagnosis present

## 2023-06-30 DIAGNOSIS — G8918 Other acute postprocedural pain: Secondary | ICD-10-CM

## 2023-06-30 DIAGNOSIS — M170 Bilateral primary osteoarthritis of knee: Secondary | ICD-10-CM | POA: Diagnosis present

## 2023-06-30 DIAGNOSIS — N393 Stress incontinence (female) (male): Secondary | ICD-10-CM | POA: Diagnosis present

## 2023-06-30 LAB — HIV ANTIBODY (ROUTINE TESTING W REFLEX): HIV Screen 4th Generation wRfx: NONREACTIVE

## 2023-06-30 LAB — CBC
HCT: 26.8 % — ABNORMAL LOW (ref 36.0–46.0)
Hemoglobin: 9.3 g/dL — ABNORMAL LOW (ref 12.0–15.0)
MCH: 32.7 pg (ref 26.0–34.0)
MCHC: 34.7 g/dL (ref 30.0–36.0)
MCV: 94.4 fL (ref 80.0–100.0)
Platelets: 215 10*3/uL (ref 150–400)
RBC: 2.84 MIL/uL — ABNORMAL LOW (ref 3.87–5.11)
RDW: 14.5 % (ref 11.5–15.5)
WBC: 19.3 10*3/uL — ABNORMAL HIGH (ref 4.0–10.5)
nRBC: 0 % (ref 0.0–0.2)

## 2023-06-30 LAB — COMPREHENSIVE METABOLIC PANEL
ALT: 37 U/L (ref 0–44)
AST: 45 U/L — ABNORMAL HIGH (ref 15–41)
Albumin: 3.7 g/dL (ref 3.5–5.0)
Alkaline Phosphatase: 96 U/L (ref 38–126)
Anion gap: 12 (ref 5–15)
BUN: 25 mg/dL — ABNORMAL HIGH (ref 8–23)
CO2: 21 mmol/L — ABNORMAL LOW (ref 22–32)
Calcium: 8.9 mg/dL (ref 8.9–10.3)
Chloride: 100 mmol/L (ref 98–111)
Creatinine, Ser: 1.62 mg/dL — ABNORMAL HIGH (ref 0.44–1.00)
GFR, Estimated: 36 mL/min — ABNORMAL LOW (ref >60)
Glucose, Bld: 160 mg/dL — ABNORMAL HIGH (ref 70–99)
Potassium: 4.4 mmol/L (ref 3.5–5.1)
Sodium: 133 mmol/L — ABNORMAL LOW (ref 135–145)
Total Bilirubin: 0.7 mg/dL (ref <1.2)
Total Protein: 6.9 g/dL (ref 6.5–8.1)

## 2023-06-30 LAB — URINALYSIS, ROUTINE W REFLEX MICROSCOPIC
Bacteria, UA: NONE SEEN
Bilirubin Urine: NEGATIVE
Glucose, UA: NEGATIVE mg/dL
Ketones, ur: NEGATIVE mg/dL
Leukocytes,Ua: NEGATIVE
Nitrite: NEGATIVE
Protein, ur: NEGATIVE mg/dL
Specific Gravity, Urine: 1.025 (ref 1.005–1.030)
pH: 5 (ref 5.0–8.0)

## 2023-06-30 LAB — I-STAT CG4 LACTIC ACID, ED: Lactic Acid, Venous: 1.9 mmol/L (ref 0.5–1.9)

## 2023-06-30 LAB — TROPONIN I (HIGH SENSITIVITY)
Troponin I (High Sensitivity): 396 ng/L (ref <18)
Troponin I (High Sensitivity): 406 ng/L (ref <18)

## 2023-06-30 LAB — LIPASE, BLOOD: Lipase: 28 U/L (ref 11–51)

## 2023-06-30 MED ORDER — ACETAMINOPHEN 500 MG PO TABS
1000.0000 mg | ORAL_TABLET | Freq: Four times a day (QID) | ORAL | Status: DC
Start: 2023-06-30 — End: 2023-07-03
  Administered 2023-06-30 – 2023-07-03 (×11): 1000 mg via ORAL
  Filled 2023-06-30 (×12): qty 2

## 2023-06-30 MED ORDER — SODIUM CHLORIDE 0.9 % IV BOLUS
500.0000 mL | Freq: Once | INTRAVENOUS | Status: AC
  Administered 2023-06-30: 500 mL via INTRAVENOUS

## 2023-06-30 MED ORDER — METOPROLOL TARTRATE 12.5 MG HALF TABLET
12.5000 mg | ORAL_TABLET | Freq: Two times a day (BID) | ORAL | Status: DC
  Administered 2023-06-30 – 2023-07-03 (×6): 12.5 mg via ORAL
  Filled 2023-06-30 (×6): qty 1

## 2023-06-30 MED ORDER — ONDANSETRON HCL 4 MG/2ML IJ SOLN
4.0000 mg | Freq: Four times a day (QID) | INTRAMUSCULAR | Status: DC | PRN
  Administered 2023-06-30 – 2023-07-02 (×2): 4 mg via INTRAVENOUS
  Filled 2023-06-30 (×2): qty 2

## 2023-06-30 MED ORDER — AMLODIPINE BESYLATE 5 MG PO TABS
5.0000 mg | ORAL_TABLET | Freq: Every day | ORAL | Status: DC
  Administered 2023-07-01 – 2023-07-03 (×3): 5 mg via ORAL
  Filled 2023-06-30 (×3): qty 1

## 2023-06-30 MED ORDER — PANTOPRAZOLE SODIUM 40 MG IV SOLR
40.0000 mg | Freq: Every day | INTRAVENOUS | Status: DC
Start: 2023-06-30 — End: 2023-07-03
  Administered 2023-06-30 – 2023-07-02 (×3): 40 mg via INTRAVENOUS
  Filled 2023-06-30 (×3): qty 10

## 2023-06-30 MED ORDER — ASPIRIN 81 MG PO CHEW
81.0000 mg | CHEWABLE_TABLET | Freq: Every day | ORAL | Status: DC
  Administered 2023-07-01 – 2023-07-03 (×3): 81 mg via ORAL
  Filled 2023-06-30 (×3): qty 1

## 2023-06-30 MED ORDER — IOHEXOL 300 MG/ML  SOLN
80.0000 mL | Freq: Once | INTRAMUSCULAR | Status: AC | PRN
  Administered 2023-06-30: 80 mL via INTRAVENOUS

## 2023-06-30 MED ORDER — ISOSORBIDE MONONITRATE ER 60 MG PO TB24
90.0000 mg | ORAL_TABLET | Freq: Every day | ORAL | Status: DC
  Administered 2023-07-01: 90 mg via ORAL
  Filled 2023-06-30: qty 1

## 2023-06-30 MED ORDER — ONDANSETRON 4 MG PO TBDP: 4.0000 mg | ORAL_TABLET | Freq: Four times a day (QID) | ORAL | Status: DC | PRN

## 2023-06-30 MED ORDER — ASPIRIN 81 MG PO CHEW
162.0000 mg | CHEWABLE_TABLET | Freq: Once | ORAL | Status: AC
  Administered 2023-06-30: 162 mg via ORAL
  Filled 2023-06-30: qty 2

## 2023-06-30 MED ORDER — HYDROMORPHONE HCL 1 MG/ML IJ SOLN
1.0000 mg | INTRAMUSCULAR | Status: DC | PRN
  Administered 2023-06-30 – 2023-07-01 (×2): 1 mg via INTRAVENOUS
  Filled 2023-06-30 (×3): qty 1

## 2023-06-30 MED ORDER — ONDANSETRON HCL 4 MG/2ML IJ SOLN
4.0000 mg | Freq: Once | INTRAMUSCULAR | Status: AC
  Administered 2023-06-30: 4 mg via INTRAVENOUS
  Filled 2023-06-30: qty 2

## 2023-06-30 MED ORDER — OXYCODONE HCL 5 MG PO TABS
5.0000 mg | ORAL_TABLET | ORAL | Status: DC | PRN
  Administered 2023-07-01: 5 mg via ORAL
  Filled 2023-06-30: qty 1

## 2023-06-30 MED ORDER — HYDROMORPHONE HCL 1 MG/ML IJ SOLN
0.5000 mg | Freq: Once | INTRAMUSCULAR | Status: AC
  Administered 2023-06-30: 0.5 mg via INTRAVENOUS
  Filled 2023-06-30: qty 1

## 2023-06-30 MED ORDER — SODIUM CHLORIDE 0.9 % IV SOLN
2.0000 g | Freq: Two times a day (BID) | INTRAVENOUS | Status: DC
  Administered 2023-06-30 – 2023-07-03 (×7): 2 g via INTRAVENOUS
  Filled 2023-06-30 (×7): qty 2

## 2023-06-30 MED ORDER — KCL IN DEXTROSE-NACL 20-5-0.9 MEQ/L-%-% IV SOLN
INTRAVENOUS | Status: AC
  Filled 2023-06-30 (×3): qty 1000

## 2023-06-30 MED ORDER — ALBUTEROL SULFATE (2.5 MG/3ML) 0.083% IN NEBU
3.0000 mL | INHALATION_SOLUTION | Freq: Four times a day (QID) | RESPIRATORY_TRACT | Status: DC | PRN
  Administered 2023-06-30: 3 mL via RESPIRATORY_TRACT
  Filled 2023-06-30: qty 3

## 2023-06-30 NOTE — ED Triage Notes (Addendum)
Pt BIBA from home. C/o RUQ and chest pain, N/V, and a split umbilical incision after Lap chole yesterday.    Pt states they took a nitroglycerin for the chest pain- hx of angina  Given 4 mg IV Zofran by EMS  Fever of 100.9 at home

## 2023-06-30 NOTE — ED Notes (Signed)
Pt states that her pain is a 10 on a  scale of 0-10. She is currently not assigned to a provider and so I messaged Dr. Eloise Harman

## 2023-06-30 NOTE — H&P (Signed)
[name] is an 62 y.o. female.   Chief Complaint: Abdominal pain and vomiting HPI: This is a 62 year old female with multiple and medical problems who is status post a laparoscopic cholecystectomy yesterday by Dr. Andrey Campanile.  Last evening she started developing nausea and vomiting.  This caused the umbilical incision to come open.  She called the office this morning and we recommended that she proceed to the emergency room.  She reports mild to moderate abdominal pain.  She denies chest pain or shortness of breath.  Past Medical History:  Diagnosis Date   Allergy    Anemia    takes iron supplement   Arthritis    knees, shoulders   Asthma    Bipolar 1 disorder (HCC)    Blood transfusion without reported diagnosis    Cataract, immature    CHF (congestive heart failure) (HCC)    Clotting disorder (HCC)    COPD (chronic obstructive pulmonary disease) (HCC)    Coronary artery disease    triple vessel   Depression    Dyspnea    Family history of adverse reaction to anesthesia    states father "did not do well" with anesthesia; died last time he had surgery; also had heart disease   Gallstones    GERD (gastroesophageal reflux disease)    occasional - no current med.   Glaucoma    Hammertoe 05/2015   right second toe   Heart murmur    Hepatic steatosis    High cholesterol    History of MI (myocardial infarction)    Hypertension    states under control with meds., has been on med. x 8 yr.   Myocardial infarction (HCC)    Osteoporosis    Panic attacks    Peripheral vascular disease (HCC)    carotid   Renal cyst, left    Schizophrenia (HCC)    Short of breath on exertion    Sleep apnea    Inspire in future   Urinary, incontinence, stress female    Wears dentures    upper   Wears partial dentures    lower    Past Surgical History:  Procedure Laterality Date   C-SECTIONS X 2  1985, 1988   CARDIAC CATHETERIZATION  12/10/2006; 12/18/2006; 07/10/2007; 07/19/2009   CARDIAC  CATHETERIZATION  03/04/2010   patent grafts, normal LV function, diffusely diseased RCA (Dr. Erlene Quan)   CARDIAC CATHETERIZATION N/A 02/15/2016   Procedure: Left Heart Cath and Cors/Grafts Angiography;  Surgeon: Peter M Swaziland, MD;  Location: [facility] INVASIVE CV LAB;  Service: Cardiovascular;  Laterality: N/A;   CHOLECYSTECTOMY N/A 06/29/2023   Procedure: LAPAROSCOPIC CHOLECYSTECTOMY WITH ICG DYE;  Surgeon: Gaynelle Adu, MD;  Location: [facility] ORS;  Service: General;  Laterality: N/A;   CLUB FOOT RELEASE Bilateral    1st and 2nd toes   COLONOSCOPY     CORONARY ARTERY BYPASS GRAFT  07/16/2007   LIMA to LAD, VG to ramus, VG to PDA    CORONARY STENT INTERVENTION N/A 06/23/2019   Procedure: CORONARY STENT INTERVENTION;  Surgeon: Runell Gess, MD;  Location: [facility] INVASIVE CV LAB;  Service: Cardiovascular;  Laterality: N/A;   DRUG INDUCED ENDOSCOPY N/A 05/17/2023   Procedure: DRUG INDUCED ENDOSCOPY;  Surgeon: Oretha Milch, MD;  Location: [facility] ENDOSCOPY;  Service: Pulmonary;  Laterality: N/A;   FLEXIBLE BRONCHOSCOPY  07/16/2007   HAMMERTOE RECONSTRUCTION WITH WEIL OSTEOTOMY Right 06/03/2015   Procedure: RIGHT SECOND HAMMERTOE CORRECTION WITH MT WEIL OSTEOTOMY;  Surgeon: Toni Arthurs, MD;  Location: [facility];  Service: Orthopedics;  Laterality: Right;   HAND SURGERY Bilateral    1st and 2nd fingers - release of clubbing   KNEE ARTHROSCOPY WITH ANTERIOR CRUCIATE LIGAMENT (ACL) REPAIR Right 01/30/2008   LEFT HEART CATH AND CORS/GRAFTS ANGIOGRAPHY N/A 06/23/2019   Procedure: LEFT HEART CATH AND CORS/GRAFTS ANGIOGRAPHY;  Surgeon: Runell Gess, MD;  Location: [facility] INVASIVE CV LAB;  Service: Cardiovascular;  Laterality: N/A;   LEFT HEART CATH AND CORS/GRAFTS ANGIOGRAPHY N/A 03/01/2020   Procedure: LEFT HEART CATH AND CORS/GRAFTS ANGIOGRAPHY;  Surgeon: Lyn Records, MD;  Location: [facility] INVASIVE CV LAB;  Service: Cardiovascular;  Laterality: N/A;   NOVASURE ABLATION  12/19/2007   with removal of  IUD   ORIF FOOT FRACTURE Left    TOE SURGERIES TO REMOVE NAILS DUE TO CLUBBING     TOTAL KNEE ARTHROPLASTY Right 10/21/2012   Procedure: TOTAL KNEE ARTHROPLASTY;  Surgeon: Shelda Pal, MD;  Location: [facility];  Service: Orthopedics;  Laterality: Right;   UPPER GASTROINTESTINAL ENDOSCOPY      Family History  Problem Relation Age of Onset   Lung cancer Mother    Kidney cancer Mother    Heart disease Father    Anesthesia problems Father        states "did not do well" with anesthesia   Colon polyps Sister    Breast cancer Maternal Aunt    Breast cancer Maternal Aunt    Stomach cancer Neg Hx    Esophageal cancer Neg Hx    Pancreatic cancer Neg Hx    Rectal cancer Neg Hx    Colon cancer Neg Hx    Social History:  reports that she has been smoking cigarettes. She started smoking about 37 years ago. She has a 75.7 pack-year smoking history. She has never used smokeless tobacco. She reports that she does not drink alcohol and does not use drugs.  Allergies:  Allergies  Allergen Reactions   Benadryl [Diphenhydramine Hcl] Shortness Of Breath and Swelling    Tongue swelling   Codeine Other (See Comments)    "makes me psychotic"    Haldol [Haloperidol Decanoate] Anaphylaxis   Amoxicillin Hives and Swelling    Swelling to extremities Did it involve swelling of the face/tongue/throat, SOB, or low BP? Yes Did it involve sudden or severe rash/hives, skin peeling, or any reaction on the inside of your mouth or nose? Yes Did you need to seek medical attention at a hospital or doctor's office? Yes When did it last happen?    young adult   If all above answers are "NO", may proceed with cephalosporin use.   Bee Venom Swelling and Other (See Comments)    "pimples" from head to toe and whole body swelling    (Not in a hospital admission)   Results for orders placed or performed during the hospital encounter of 06/30/23 (from the past 48 hour(s))  Lipase, blood     Status: None    Collection Time: 06/30/23  9:08 AM  Result Value Ref Range   Lipase 28 11 - 51 U/L    Comment: Performed at [facility], [address], [place], [place] [address]  Comprehensive metabolic panel     Status: Abnormal   Collection Time: 06/30/23  9:08 AM  Result Value Ref Range   Sodium 133 (L) 135 - 145 mmol/L   Potassium 4.4 3.5 - 5.1 mmol/L   Chloride 100 98 - 111 mmol/L   CO2 21 (  L) 22 - 32 mmol/L   Glucose, Bld 160 (H) 70 - 99 mg/dL    Comment: Glucose reference range applies only to samples taken after fasting for at least 8 hours.   BUN 25 (H) 8 - 23 mg/dL   Creatinine, Ser 0.98 (H) 0.44 - 1.00 mg/dL   Calcium 8.9 8.9 - 11.9 mg/dL   Total Protein 6.9 6.5 - 8.1 g/dL   Albumin 3.7 3.5 - 5.0 g/dL   AST 45 (H) 15 - 41 U/L   ALT 37 0 - 44 U/L   Alkaline Phosphatase 96 38 - 126 U/L   Total Bilirubin 0.7 <1.2 mg/dL   GFR, Estimated 36 (L) >60 mL/min    Comment: (NOTE) Calculated using the CKD-EPI Creatinine Equation (2021)    Anion gap 12 5 - 15    Comment: Performed at [facility], [address] [place], [place] [address]  CBC     Status: Abnormal   Collection Time: 06/30/23  9:08 AM  Result Value Ref Range   WBC 19.3 (H) 4.0 - 10.5 K/uL   RBC 2.84 (L) 3.87 - 5.11 MIL/uL   Hemoglobin 9.3 (L) 12.0 - 15.0 g/dL   HCT 95.6 (L) 21.3 - 08.6 %   MCV 94.4 80.0 - 100.0 fL   MCH 32.7 26.0 - 34.0 pg   MCHC 34.7 30.0 - 36.0 g/dL   RDW 57.8 46.9 - 62.9 %   Platelets 215 150 - 400 K/uL   nRBC 0.0 0.0 - 0.2 %    Comment: Performed at [facility], [address], [place], [place] [address]  Troponin I (High Sensitivity)     Status: Abnormal   Collection Time: 06/30/23  9:08 AM  Result Value Ref Range   Troponin I (High Sensitivity) 396 (HH) <18 ng/L    Comment: CRITICAL RESULT CALLED TO, READ BACK BY AND VERIFIED WITH HOLLATZ, C RN AT 1038 ON 06/30/2023 BY XIONG, K (NOTE) Elevated high sensitivity troponin I (hsTnI) values  and significant  changes across serial measurements may suggest ACS but many other  chronic and acute conditions are known to elevate hsTnI results.  Refer to the "Links" section for chest pain algorithms and additional  guidance. Performed at [facility], [address], [place], [place] [address]   I-Stat CG4 Lactic Acid     Status: None   Collection Time: 06/30/23  9:17 AM  Result Value Ref Range   Lactic Acid, Venous 1.9 0.5 - 1.9 mmol/L   CT ABDOMEN PELVIS W CONTRAST  Result Date: 06/30/2023 CLINICAL DATA:  Right upper quadrant abdominal pain with nausea and vomiting. Recent laparoscopic cholecystectomy. EXAM: CT ABDOMEN AND PELVIS WITH CONTRAST TECHNIQUE: Multidetector CT imaging of the abdomen and pelvis was performed using the standard protocol following bolus administration of intravenous contrast. RADIATION DOSE REDUCTION: This exam was performed according to the departmental dose-optimization program which includes automated exposure control, adjustment of the mA and/or kV according to patient size and/or use of iterative reconstruction technique. CONTRAST:  80mL OMNIPAQUE IOHEXOL 300 MG/ML  SOLN COMPARISON:  CT scan 11/26/2020 FINDINGS: Lower chest: Minimal streaky bibasilar atelectasis but no infiltrates or effusions. No pericardial effusion. Hepatobiliary: Surgical changes from recent cholecystectomy. Small amount of free air is noted and not unexpected. There is a significant perihepatic fluid collection extending down the right pericolic gutter and into the pelvis. This is high attenuation and consistent with hematoma. No active extravasation of contrast material is identified. The portal  and hepatic veins are patent. Expected surgical changes in the gallbladder fossa with complex fluid collection and surgical clips and gas. No biliary dilatation. Pancreas: Unremarkable. No pancreatic ductal dilatation or surrounding inflammatory changes. Spleen: Normal in size  without focal abnormality. Adrenals/Urinary Tract: Small bilateral renal cysts. There is an indeterminate 15 mm lesion involving the upper pole region of the right kidney. This could be a complex cyst. It was present on the prior CT scan from 2022 but has doubled in size. Recommend MR imaging for further evaluation. The bladder is unremarkable. Stomach/Bowel: The stomach, duodenum, small bowel and colon are unremarkable. No acute inflammatory process, mass lesions or obstructive findings. Vascular/Lymphatic: Stable age advanced atherosclerotic calcifications involving the aorta and branch vessels. No abdominal or pelvic lymphadenopathy. Reproductive: No significant findings. Other: None Musculoskeletal: No significant bony findings. Slightly irregular fatty lesion in the adductor magnus muscle on the left. Measures a maximum of 4.4 cm and some heterogeneous signal intensity. Recommend MRI for further evaluation. IMPRESSION: 1. Sizable postoperative hematoma surrounding the liver and extending down into the pelvis. No active extravasation of contrast is identified. 2. Expected surgical changes in the gallbladder fossa with complex fluid collection and surgical clips and gas. 3. Indeterminate 15 mm lesion involving the upper pole region of the right kidney. This could be a complex cyst. It was present on the prior CT scan from 2022 but has doubled in size. Recommend MR imaging for further evaluation. 4. Slightly irregular 4.4 cm fatty lesion in the adductor magnus muscle on the left. Recommend MRI without and with contrast for further evaluation. 5. Stable age advanced atherosclerotic calcifications involving the aorta and branch vessels. Aortic Atherosclerosis (ICD10-I70.0). Electronically Signed   By: Rudie Meyer M.D.   On: 06/30/2023 11:00    Review of Systems  All other systems reviewed and are negative.   Blood pressure 125/84, pulse 78, temperature 98.6 F (37 C), temperature source Oral, resp. rate 20,  height 5\' 5"  (1.651 m), weight 87.5 kg, last menstrual period 11/16/2011, SpO2 98%. Physical Exam Constitutional:      Appearance: She is obese. She is not diaphoretic.  HENT:     Head: Normocephalic and atraumatic.  Cardiovascular:     Rate and Rhythm: Normal rate and regular rhythm.  Abdominal:     Comments: Abdomen is obese.  There is only mild tenderness and no frank peritonitis  Umbilical incision is open.  I was able to get a close back with Dermabond and covered with a dry gauze  Skin:    General: Skin is warm and dry.  Neurological:     General: No focal deficit present.     Mental Status: She is alert.  Psychiatric:        Behavior: Behavior normal.      Assessment/Plan Patient status post laparoscopic cholecystectomy with postoperative bleed.  I reviewed the CT scan of the abdomen and pelvis.  There is a hematoma around the liver and in the pelvis but there is no extravasation of contrast.  I do not believe she is actively bleeding at this point.  Dilated loops of small bowel and the fascia is intact at the umbilicus.  Her hemoglobin decreased To 9.3 from 13.9 which was drawn 12 days ago.  She is hemodynamically stable. Given the elevated troponin and EKG changes, I am uncertain as to whether she is having a cardiac event.  I have asked the EDP to consult cardiology as I am heading to the operating room for  another procedure.  She will need to be admitted to the hospital for IV rehydration as she does have elevated creatinine.  Will wait on cardiology opinion as to whether or not she needs to be transferred over to [facility] or whether she can be admitted to [facility].  We will keep her n.p.o. until that determination is made  Abigail Miyamoto, MD 06/30/2023, 11:17 AM

## 2023-06-30 NOTE — Consult Note (Signed)
CONSULTATION NOTE   Patient Name: [name] Date of Encounter: 06/30/2023 Cardiologist: Nanetta Batty, MD Electrophysiologist: None Advanced Heart Failure: None   Chief Complaint   Abdominal pain, nausea  Patient Profile   62 yo female s/p lap chole yesterday, who developed nausea and vomiting and was noted to have elevated troponin to 400 and possible ischemic EKG changes  HPI   [name] is a 62 y.o. female who is being seen today for the evaluation of elevated troponin at the request of Dr. Magnus Ivan. This is a 62 year old female with a history of coronary artery disease and remote stenting of the RCA by Dr. Algie Coffer.  She had CABG in November 2008 with LIMA to LAD and SVG to ramus and PDA.  She was seen by Dr. Allyson Sabal earlier this year and had recently stopped smoking.  Other problems include hypertension, dyslipidemia and obstructive sleep apnea.  Her last cardiac catheterization was in 2021 when she presented with NSTEMI and underwent cardiac catheterization which showed an occluded RCA vein graft with left-to-right collaterals.  She recently received cardiovascular risk evaluation for an elective laparoscopic cholecystectomy with Dr. Andrey Campanile.  She had that surgery yesterday but then subsequently had nausea and vomiting and abdominal pain.  This apparently led to some dehiscence of the umbilical incision.  She was directed to the emergency room.  Workup included troponins which were elevated at 396 and 406, respectively.  This is higher than they have been in the past.  Discussion with Dr. Royann Shivers who was on-call suggested that we start aspirin however heparin was held.  An EKG was performed which shows sinus rhythm and old inferior infarct pattern with subtle lateral ST and T wave changes.  Cardiology is asked to evaluate for possible acute coronary syndrome.  PMHx   Past Medical History:  Diagnosis Date   Allergy    Anemia    takes iron supplement   Arthritis     knees, shoulders   Asthma    Bipolar 1 disorder (HCC)    Blood transfusion without reported diagnosis    Cataract, immature    CHF (congestive heart failure) (HCC)    Clotting disorder (HCC)    COPD (chronic obstructive pulmonary disease) (HCC)    Coronary artery disease    triple vessel   Depression    Dyspnea    Family history of adverse reaction to anesthesia    states father "did not do well" with anesthesia; died last time he had surgery; also had heart disease   Gallstones    GERD (gastroesophageal reflux disease)    occasional - no current med.   Glaucoma    Hammertoe 05/2015   right second toe   Heart murmur    Hepatic steatosis    High cholesterol    History of MI (myocardial infarction)    Hypertension    states under control with meds., has been on med. x 8 yr.   Myocardial infarction (HCC)    Osteoporosis    Panic attacks    Peripheral vascular disease (HCC)    carotid   Renal cyst, left    Schizophrenia (HCC)    Short of breath on exertion    Sleep apnea    Inspire in future   Urinary, incontinence, stress female    Wears dentures    upper   Wears partial dentures    lower    Past Surgical History:  Procedure Laterality Date   C-SECTIONS X 2  1985, 1988   CARDIAC CATHETERIZATION  12/10/2006; 12/18/2006; 07/10/2007; 07/19/2009   CARDIAC CATHETERIZATION  03/04/2010   patent grafts, normal LV function, diffusely diseased RCA (Dr. Erlene Quan)   CARDIAC CATHETERIZATION N/A 02/15/2016   Procedure: Left Heart Cath and Cors/Grafts Angiography;  Surgeon: Peter M Swaziland, MD;  Location: [facility] INVASIVE CV LAB;  Service: Cardiovascular;  Laterality: N/A;   CHOLECYSTECTOMY N/A 06/29/2023   Procedure: LAPAROSCOPIC CHOLECYSTECTOMY WITH ICG DYE;  Surgeon: Gaynelle Adu, MD;  Location: [facility];  Service: General;  Laterality: N/A;   CLUB FOOT RELEASE Bilateral    1st and 2nd toes   COLONOSCOPY     CORONARY ARTERY BYPASS GRAFT  07/16/2007   LIMA to LAD, VG to ramus, VG to PDA     CORONARY STENT INTERVENTION N/A 06/23/2019   Procedure: CORONARY STENT INTERVENTION;  Surgeon: Runell Gess, MD;  Location: [facility] INVASIVE CV LAB;  Service: Cardiovascular;  Laterality: N/A;   DRUG INDUCED ENDOSCOPY N/A 05/17/2023   Procedure: DRUG INDUCED ENDOSCOPY;  Surgeon: Oretha Milch, MD;  Location: [facility] ENDOSCOPY;  Service: Pulmonary;  Laterality: N/A;   FLEXIBLE BRONCHOSCOPY  07/16/2007   HAMMERTOE RECONSTRUCTION WITH WEIL OSTEOTOMY Right 06/03/2015   Procedure: RIGHT SECOND HAMMERTOE CORRECTION WITH MT WEIL OSTEOTOMY;  Surgeon: Toni Arthurs, MD;  Location: [facility];  Service: Orthopedics;  Laterality: Right;   HAND SURGERY Bilateral    1st and 2nd fingers - release of clubbing   KNEE ARTHROSCOPY WITH ANTERIOR CRUCIATE LIGAMENT (ACL) REPAIR Right 01/30/2008   LEFT HEART CATH AND CORS/GRAFTS ANGIOGRAPHY N/A 06/23/2019   Procedure: LEFT HEART CATH AND CORS/GRAFTS ANGIOGRAPHY;  Surgeon: Runell Gess, MD;  Location: [facility];  Service: Cardiovascular;  Laterality: N/A;   LEFT HEART CATH AND CORS/GRAFTS ANGIOGRAPHY N/A 03/01/2020   Procedure: LEFT HEART CATH AND CORS/GRAFTS ANGIOGRAPHY;  Surgeon: Lyn Records, MD;  Location: [facility] INVASIVE CV LAB;  Service: Cardiovascular;  Laterality: N/A;   NOVASURE ABLATION  12/19/2007   with removal of IUD   ORIF FOOT FRACTURE Left    TOE SURGERIES TO REMOVE NAILS DUE TO CLUBBING     TOTAL KNEE ARTHROPLASTY Right 10/21/2012   Procedure: TOTAL KNEE ARTHROPLASTY;  Surgeon: Shelda Pal, MD;  Location: [facility];  Service: Orthopedics;  Laterality: Right;   UPPER GASTROINTESTINAL ENDOSCOPY      FAMHx   Family History  Problem Relation Age of Onset   Lung cancer Mother    Kidney cancer Mother    Heart disease Father    Anesthesia problems Father        states "did not do well" with anesthesia   Colon polyps Sister    Breast cancer Maternal Aunt    Breast cancer Maternal Aunt    Stomach cancer Neg Hx     Esophageal cancer Neg Hx    Pancreatic cancer Neg Hx    Rectal cancer Neg Hx    Colon cancer Neg Hx     SOCHx    reports that she has been smoking cigarettes. She started smoking about 37 years ago. She has a 75.7 pack-year smoking history. She has never used smokeless tobacco. She reports that she does not drink alcohol and does not use drugs.  Outpatient Medications   No current facility-administered medications on file prior to encounter.   Current Outpatient Medications on File Prior to Encounter  Medication Sig Dispense Refill   acetaminophen (TYLENOL) 500 MG tablet Take 2 tablets (1,000 mg total) by mouth  every 8 (eight) hours for 5 days.     albuterol (PROVENTIL HFA;VENTOLIN HFA) 108 (90 BASE) MCG/ACT inhaler Inhale 2 puffs into the lungs every 6 (six) hours as needed for wheezing or shortness of breath.      Alirocumab (PRALUENT) 150 MG/ML SOAJ INJECT 1 PEN INTO THE SKIN EVERY 14 DAYS. 6 mL 3   amLODipine (NORVASC) 5 MG tablet Take 1 tablet (5 mg total) by mouth daily. 90 tablet 3   aspirin 81 MG chewable tablet Chew 81 mg by mouth in the morning and at bedtime.     atorvastatin (LIPITOR) 80 MG tablet Take 1 tablet (80 mg total) by mouth daily. 90 tablet 1   brimonidine (ALPHAGAN) 0.15 % ophthalmic solution Place 1 drop into both eyes 2 (two) times daily.      Budeson-Glycopyrrol-Formoterol (BREZTRI AEROSPHERE) 160-9-4.8 MCG/ACT AERO Inhale 2 puffs into the lungs in the morning and at bedtime. 10.7 each 5   [START ON 07/01/2023] clopidogrel (PLAVIX) 75 MG tablet Take 1 tablet (75 mg total) by mouth daily.     escitalopram (LEXAPRO) 20 MG tablet Take 20 mg by mouth daily.     fluticasone (FLONASE) 50 MCG/ACT nasal spray Place 2 sprays into both nostrils daily. 16 g 6   icosapent Ethyl (VASCEPA) 1 g capsule Take 2 capsules (2 g total) by mouth 2 (two) times daily. (Patient taking differently: Take 1 g by mouth 2 (two) times daily.) 120 capsule 2   isosorbide mononitrate (IMDUR) 30  MG 24 hr tablet TAKE 3 TABLETS BY MOUTH DAILY. 270 tablet 3   lamoTRIgine (LAMICTAL) 100 MG tablet Take 100 mg by mouth daily.     linaclotide (LINZESS) 145 MCG CAPS capsule Take 1 capsule (145 mcg total) by mouth daily before breakfast. (Patient taking differently: Take 145 mcg by mouth daily as needed (constipation).) 90 capsule 3   metoprolol tartrate (LOPRESSOR) 25 MG tablet TAKE 1/2 TABLET BY MOUTH TWICE A DAY 90 tablet 3   niacin (NIASPAN) 1000 MG CR tablet TAKE 1 TABLET (1,000 MG TOTAL) BY MOUTH AT BEDTIME. 90 tablet 3   nitroGLYCERIN (NITROSTAT) 0.4 MG SL tablet Place 1 tablet (0.4 mg total) under the tongue every 5 (five) minutes as needed for chest pain. 25 tablet 3   pantoprazole (PROTONIX) 40 MG tablet Take 1 tablet (40 mg total) by mouth daily. 30 tablet 5   QUEtiapine Fumarate 150 MG TABS Take 1 tablet by mouth at bedtime.     ranolazine (RANEXA) 500 MG 12 hr tablet TAKE 1 TABLET BY MOUTH TWICE A DAY 180 tablet 2   traMADol (ULTRAM) 50 MG tablet Take 1 tablet (50 mg total) by mouth every 6 (six) hours as needed for up to 7 days. 15 tablet 0   Vitamin D, Ergocalciferol, (DRISDOL) 1.25 MG (50000 UNIT) CAPS capsule Take 50,000 Units by mouth every 7 (seven) days.     VITAMIN E PO Take 1 tablet by mouth daily.     Ascorbic Acid (VITAMIN C PO) Take 1 tablet by mouth daily.      Inpatient Medications    Scheduled Meds:  acetaminophen  1,000 mg Oral Q6H   [START ON 07/01/2023] amLODipine  5 mg Oral Daily   [START ON 07/01/2023] isosorbide mononitrate  90 mg Oral Daily   metoprolol tartrate  12.5 mg Oral BID   pantoprazole (PROTONIX) IV  40 mg Intravenous QHS    Continuous Infusions:  cefoTEtan (CEFOTAN) IV     dextrose 5 % and  0.9 % NaCl with KCl 20 mEq/L      PRN Meds: albuterol, HYDROmorphone (DILAUDID) injection, ondansetron **OR** ondansetron (ZOFRAN) IV, oxyCODONE   ALLERGIES   Allergies  Allergen Reactions   Benadryl [Diphenhydramine Hcl] Shortness Of Breath and  Swelling    Tongue swelling   Codeine Other (See Comments)    "makes me psychotic"    Haldol [Haloperidol Decanoate] Anaphylaxis   Amoxicillin Hives and Swelling    Swelling to extremities Did it involve swelling of the face/tongue/throat, SOB, or low BP? Yes Did it involve sudden or severe rash/hives, skin peeling, or any reaction on the inside of your mouth or nose? Yes Did you need to seek medical attention at a hospital or doctor's office? Yes When did it last happen?    young adult   If all above answers are "NO", may proceed with cephalosporin use.   Bee Venom Swelling and Other (See Comments)    "pimples" from head to toe and whole body swelling    ROS   Pertinent items noted in HPI and remainder of comprehensive ROS otherwise negative.  Vitals   Vitals:   06/30/23 1221 06/30/23 1230 06/30/23 1300 06/30/23 1346  BP:  (!) 107/92 (!) 135/95 130/75  Pulse:  74 76 70  Resp:  (!) 23 (!) 21 18  Temp: 98.6 F (37 C)   97.8 F (36.6 C)  TempSrc: Oral   Oral  SpO2:  93% 96% 92%  Weight:      Height:        Intake/Output Summary (Last 24 hours) at 06/30/2023 1535 Last data filed at 06/30/2023 1400 Gross per 24 hour  Intake 360 ml  Output --  Net 360 ml   Filed Weights   06/30/23 0834  Weight: 87.5 kg    Physical Exam   General appearance: alert, no distress, and mildly obese Neck: no carotid bruit, no JVD, and thyroid not enlarged, symmetric, no tenderness/mass/nodules Lungs: clear to auscultation bilaterally Heart: regular rate and rhythm Abdomen: mild TTP, post-surgical abdomen Extremities: extremities normal, atraumatic, no cyanosis or edema Pulses: 2+ and symmetric Skin: Skin color, texture, turgor normal. No rashes or lesions Neurologic: Grossly normal Psych: Anxious  Labs   Results for orders placed or performed during the hospital encounter of 06/30/23 (from the past 48 hour(s))  Lipase, blood     Status: None   Collection Time: 06/30/23  9:08 AM   Result Value Ref Range   Lipase 28 11 - 51 U/L    Comment: Performed at [facility], [address], [place], [place] [address]  Comprehensive metabolic panel     Status: Abnormal   Collection Time: 06/30/23  9:08 AM  Result Value Ref Range   Sodium 133 (L) 135 - 145 mmol/L   Potassium 4.4 3.5 - 5.1 mmol/L   Chloride 100 98 - 111 mmol/L   CO2 21 (L) 22 - 32 mmol/L   Glucose, Bld 160 (H) 70 - 99 mg/dL    Comment: Glucose reference range applies only to samples taken after fasting for at least 8 hours.   BUN 25 (H) 8 - 23 mg/dL   Creatinine, Ser 5.28 (H) 0.44 - 1.00 mg/dL   Calcium 8.9 8.9 - 41.3 mg/dL   Total Protein 6.9 6.5 - 8.1 g/dL   Albumin 3.7 3.5 - 5.0 g/dL   AST 45 (H) 15 - 41 U/L   ALT 37 0 - 44 U/L   Alkaline Phosphatase 96 38 - 126  U/L   Total Bilirubin 0.7 <1.2 mg/dL   GFR, Estimated 36 (L) >60 mL/min    Comment: (NOTE) Calculated using the CKD-EPI Creatinine Equation (2021)    Anion gap 12 5 - 15    Comment: Performed at [facility], [address], [place], [place] [address]  CBC     Status: Abnormal   Collection Time: 06/30/23  9:08 AM  Result Value Ref Range   WBC 19.3 (H) 4.0 - 10.5 K/uL   RBC 2.84 (L) 3.87 - 5.11 MIL/uL   Hemoglobin 9.3 (L) 12.0 - 15.0 g/dL   HCT 25.9 (L) 56.3 - 87.5 %   MCV 94.4 80.0 - 100.0 fL   MCH 32.7 26.0 - 34.0 pg   MCHC 34.7 30.0 - 36.0 g/dL   RDW 64.3 32.9 - 51.8 %   Platelets 215 150 - 400 K/uL   nRBC 0.0 0.0 - 0.2 %    Comment: Performed at [facility], [address] [place], [place] [address]  Troponin I (High Sensitivity)     Status: Abnormal   Collection Time: 06/30/23  9:08 AM  Result Value Ref Range   Troponin I (High Sensitivity) 396 (HH) <18 ng/L    Comment: CRITICAL RESULT CALLED TO, READ BACK BY AND VERIFIED WITH HOLLATZ, C RN AT 1038 ON 06/30/2023 BY XIONG, K (NOTE) Elevated high sensitivity troponin I (hsTnI) values and significant  changes across  serial measurements may suggest ACS but many other  chronic and acute conditions are known to elevate hsTnI results.  Refer to the "Links" section for chest pain algorithms and additional  guidance. Performed at [facility], [address] [place], [place] [address]   I-Stat CG4 Lactic Acid     Status: None   Collection Time: 06/30/23  9:17 AM  Result Value Ref Range   Lactic Acid, Venous 1.9 0.5 - 1.9 mmol/L  Troponin I (High Sensitivity)     Status: Abnormal   Collection Time: 06/30/23 11:57 AM  Result Value Ref Range   Troponin I (High Sensitivity) 406 (HH) <18 ng/L    Comment: CRITICAL RESULT CALLED TO, READ BACK BY AND VERIFIED WITH Mayford Knife, C RN AT 1249 ON 06/30/2023 BY Deedra Ehrich, K (NOTE) Elevated high sensitivity troponin I (hsTnI) values and significant  changes across serial measurements may suggest ACS but many other  chronic and acute conditions are known to elevate hsTnI results.  Refer to the "Links" section for chest pain algorithms and additional  guidance. Performed at [facility], [address] [place], [place] [address]   Urinalysis, Routine w reflex microscopic -Urine, Clean Catch     Status: Abnormal   Collection Time: 06/30/23 12:49 PM  Result Value Ref Range   Color, Urine STRAW (A) YELLOW   APPearance HAZY (A) CLEAR   Specific Gravity, Urine 1.025 1.005 - 1.030   pH 5.0 5.0 - 8.0   Glucose, UA NEGATIVE NEGATIVE mg/dL   Hgb urine dipstick SMALL (A) NEGATIVE   Bilirubin Urine NEGATIVE NEGATIVE   Ketones, ur NEGATIVE NEGATIVE mg/dL   Protein, ur NEGATIVE NEGATIVE mg/dL   Nitrite NEGATIVE NEGATIVE   Leukocytes,Ua NEGATIVE NEGATIVE   RBC / HPF 0-5 0 - 5 RBC/hpf   WBC, UA 0-5 0 - 5 WBC/hpf   Bacteria, UA NONE SEEN NONE SEEN   Squamous Epithelial / HPF 0-5 0 - 5 /HPF   Mucus PRESENT    Hyaline Casts, UA PRESENT  Comment: Performed at [facility], [address] [place], [place] [address]   HIV Antibody (routine testing w rflx)     Status: None   Collection Time: 06/30/23  1:48 PM  Result Value Ref Range   HIV Screen 4th Generation wRfx Non Reactive Non Reactive    Comment: Performed at [facility], [address], [place], [place] [address]    ECG   Sinus rhythm with inferior Q waves and lateral ST depressions - Personally Reviewed  Telemetry   Sinus rhythm - Personally Reviewed  Radiology   DG Chest Portable 1 View  Result Date: 06/30/2023 CLINICAL DATA:  Chest pain. EXAM: PORTABLE CHEST 1 VIEW COMPARISON:  March 05, 2020. FINDINGS: The heart size and mediastinal contours are within normal limits. Status post coronary artery bypass graft. Both lungs are clear. The visualized skeletal structures are unremarkable. IMPRESSION: No active disease. Electronically Signed   By: Lupita Raider M.D.   On: 06/30/2023 11:21   CT ABDOMEN PELVIS W CONTRAST  Result Date: 06/30/2023 CLINICAL DATA:  Right upper quadrant abdominal pain with nausea and vomiting. Recent laparoscopic cholecystectomy. EXAM: CT ABDOMEN AND PELVIS WITH CONTRAST TECHNIQUE: Multidetector CT imaging of the abdomen and pelvis was performed using the standard protocol following bolus administration of intravenous contrast. RADIATION DOSE REDUCTION: This exam was performed according to the departmental dose-optimization program which includes automated exposure control, adjustment of the mA and/or kV according to patient size and/or use of iterative reconstruction technique. CONTRAST:  80mL OMNIPAQUE IOHEXOL 300 MG/ML  SOLN COMPARISON:  CT scan 11/26/2020 FINDINGS: Lower chest: Minimal streaky bibasilar atelectasis but no infiltrates or effusions. No pericardial effusion. Hepatobiliary: Surgical changes from recent cholecystectomy. Small amount of free air is noted and not unexpected. There is a significant perihepatic fluid collection extending down the right pericolic gutter and into the pelvis. This is high  attenuation and consistent with hematoma. No active extravasation of contrast material is identified. The portal and hepatic veins are patent. Expected surgical changes in the gallbladder fossa with complex fluid collection and surgical clips and gas. No biliary dilatation. Pancreas: Unremarkable. No pancreatic ductal dilatation or surrounding inflammatory changes. Spleen: Normal in size without focal abnormality. Adrenals/Urinary Tract: Small bilateral renal cysts. There is an indeterminate 15 mm lesion involving the upper pole region of the right kidney. This could be a complex cyst. It was present on the prior CT scan from 2022 but has doubled in size. Recommend MR imaging for further evaluation. The bladder is unremarkable. Stomach/Bowel: The stomach, duodenum, small bowel and colon are unremarkable. No acute inflammatory process, mass lesions or obstructive findings. Vascular/Lymphatic: Stable age advanced atherosclerotic calcifications involving the aorta and branch vessels. No abdominal or pelvic lymphadenopathy. Reproductive: No significant findings. Other: None Musculoskeletal: No significant bony findings. Slightly irregular fatty lesion in the adductor magnus muscle on the left. Measures a maximum of 4.4 cm and some heterogeneous signal intensity. Recommend MRI for further evaluation. IMPRESSION: 1. Sizable postoperative hematoma surrounding the liver and extending down into the pelvis. No active extravasation of contrast is identified. 2. Expected surgical changes in the gallbladder fossa with complex fluid collection and surgical clips and gas. 3. Indeterminate 15 mm lesion involving the upper pole region of the right kidney. This could be a complex cyst. It was present on the prior CT scan from 2022 but has doubled in size. Recommend MR imaging for further evaluation. 4. Slightly irregular 4.4 cm fatty lesion in the adductor magnus muscle  on the left. Recommend MRI without and with contrast for further  evaluation. 5. Stable age advanced atherosclerotic calcifications involving the aorta and branch vessels. Aortic Atherosclerosis (ICD10-I70.0). Electronically Signed   By: Rudie Meyer M.D.   On: 06/30/2023 11:00    Cardiac Studies   Echo ordered  Impression   Principal Problem:   Symptomatic anemia Active Problems:   Postoperative or surgical complication Elevated troponin - demand ischemia  Recommendation   [name] has an elevated troponin which appears flat at 396 -> 406.  She did have chest pain last evening that radiated into her left arm and up to her jaw which she said is reminiscent of her angina and she took nitro for it with relief.  She had been vomiting brown emesis fairly violently and felt that she was also dehydrated.  Based on review of her last cardiac catheterization there are collaterals from left to right and an occluded RCA graft.  I suspect this is likely demand ischemia rather than loss of a bypass graft or acute coronary syndrome.  Would not pursue heparin due to higher bleeding risk but continue aspirin.  Will plan to repeat echo tomorrow.  It does sound like she has been having intermittent, but probably stable anginal symptoms.  Would likely recommend treating this medically.  Thanks for the consultation. Cardiology will follow with you.  Time Spent Directly with Patient:  I have spent a total of 45 minutes with the patient reviewing hospital notes, telemetry, EKGs, labs and examining the patient as well as establishing an assessment and plan that was discussed personally with the patient.  > 50% of time was spent in direct patient care.  Length of Stay:  LOS: 0 days   Chrystie Nose, MD, [facility], FACP  [place]  [facility]  Medical Director of the Advanced Lipid Disorders &  Cardiovascular Risk Reduction Clinic Diplomate of the American Board of Clinical Lipidology Attending Cardiologist  Direct Dial: [phone]  Fax: [phone]   Website:  [email or web address] Nicely Cowan Pilar 06/30/2023, 3:35 PM

## 2023-06-30 NOTE — ED Notes (Signed)
[facility] notified of need for transportation to [facility] [address].

## 2023-06-30 NOTE — ED Provider Notes (Signed)
EMERGENCY DEPARTMENT AT [facility] Provider Note   CSN: [id number] Arrival date & time: 06/30/23  1027     History {Add pertinent medical, surgical, social history, OB history to HPI:1} Chief Complaint  Patient presents with   Post-op Problem   Abdominal Pain   Fever   Chest Pain    [name] is a 62 y.o. female.  62 year old female with a history of COPD, CHF, MI status post PCI, and recent laparoscopic cholecystectomy presents to the emergency department with abdominal pain, fever, chest pain, nausea and vomiting.  Patient had outpatient lap chole yesterday.  Since going home has had severe abdominal pain and nausea and vomiting that has been brown.  Also had a fever of 100.9 F today.  Says that her wound dehisced beneath her umbilicus as well.    Says that during this started developing some chest discomfort that is concerning for angina.  Was L sided chest pressure radiating down her L arm. Took some nitroglycerin for the pain this morning and the pain resolved.  Is on aspirin but is been holding her Plavix.       Home Medications Prior to Admission medications   Medication Sig Start Date End Date Taking? Authorizing Provider  acetaminophen (TYLENOL) 500 MG tablet Take 2 tablets (1,000 mg total) by mouth every 8 (eight) hours for 5 days. 06/29/23 07/04/23  Gaynelle Adu, MD  albuterol (PROVENTIL HFA;VENTOLIN HFA) 108 (90 BASE) MCG/ACT inhaler Inhale 2 puffs into the lungs every 6 (six) hours as needed for wheezing or shortness of breath.     [provider]  Alirocumab (PRALUENT) 150 MG/ML SOAJ INJECT 1 PEN INTO THE SKIN EVERY 14 DAYS. 02/21/23   Runell Gess, MD  amLODipine (NORVASC) 5 MG tablet Take 1 tablet (5 mg total) by mouth daily. 06/19/22   Runell Gess, MD  aspirin 81 MG chewable tablet Chew 81 mg by mouth daily.    [provider]  atorvastatin (LIPITOR) 80 MG tablet Take 1 tablet (80 mg total) by mouth daily.  08/11/22   Runell Gess, MD  brimonidine (ALPHAGAN) 0.15 % ophthalmic solution Place 1 drop into both eyes 2 (two) times daily.     [provider]  Budeson-Glycopyrrol-Formoterol (BREZTRI AEROSPHERE) 160-9-4.8 MCG/ACT AERO Inhale 2 puffs into the lungs in the morning and at bedtime. 04/30/23   Cobb, Ruby Cola, NP  clopidogrel (PLAVIX) 75 MG tablet Take 1 tablet (75 mg total) by mouth daily. 07/01/23   Gaynelle Adu, MD  escitalopram (LEXAPRO) 20 MG tablet Take 20 mg by mouth daily. 05/31/19   [provider]  fluticasone (FLONASE) 50 MCG/ACT nasal spray Place 2 sprays into both nostrils daily. 05/30/23   Ashok Croon, MD  icosapent Ethyl (VASCEPA) 1 g capsule Take 2 capsules (2 g total) by mouth 2 (two) times daily. 08/11/22   Runell Gess, MD  isosorbide mononitrate (IMDUR) 30 MG 24 hr tablet TAKE 3 TABLETS BY MOUTH DAILY. 02/21/23   Runell Gess, MD  lamoTRIgine (LAMICTAL) 100 MG tablet Take 100 mg by mouth daily. 08/05/19   [provider]  linaclotide (LINZESS) 145 MCG CAPS capsule Take 1 capsule (145 mcg total) by mouth daily before breakfast. Patient taking differently: Take 145 mcg by mouth daily as needed (constipation). 12/25/22 12/20/23  Zehr, Shanda Bumps D, PA-C  metoprolol tartrate (LOPRESSOR) 25 MG tablet TAKE 1/2 TABLET BY MOUTH TWICE A DAY 10/20/22   Runell Gess, MD  niacin (NIASPAN)  1000 MG CR tablet TAKE 1 TABLET (1,000 MG TOTAL) BY MOUTH AT BEDTIME. 02/21/23   Runell Gess, MD  nitroGLYCERIN (NITROSTAT) 0.4 MG SL tablet Place 1 tablet (0.4 mg total) under the tongue every 5 (five) minutes as needed for chest pain. 07/07/21   Marjie Skiff E, PA-C  nystatin (MYCOSTATIN) 100000 UNIT/ML suspension Take 5 mLs (500,000 Units total) by mouth 4 (four) times daily. 05/29/23   Ashok Croon, MD  pantoprazole (PROTONIX) 40 MG tablet Take 1 tablet (40 mg total) by mouth daily. 04/30/23   Cobb, Ruby Cola, NP  QUEtiapine (SEROQUEL) 100 MG tablet  Take 100 mg by mouth at bedtime. 05/31/19   [provider]  ranolazine (RANEXA) 500 MG 12 hr tablet TAKE 1 TABLET BY MOUTH TWICE A DAY 09/15/22   Runell Gess, MD  traMADol (ULTRAM) 50 MG tablet Take 1 tablet (50 mg total) by mouth every 6 (six) hours as needed for up to 7 days. 06/29/23 07/06/23  Gaynelle Adu, MD  Vitamin D, Ergocalciferol, (DRISDOL) 1.25 MG (50000 UNIT) CAPS capsule Take 50,000 Units by mouth every 7 (seven) days. 04/30/23 04/29/24  [provider]      Allergies    Benadryl [diphenhydramine hcl], Codeine, Haldol [haloperidol decanoate], Amoxicillin, and Bee venom    Review of Systems   Review of Systems  Physical Exam Updated Vital Signs BP 125/84   Pulse 78   Temp 98.6 F (37 C) (Oral)   Resp 20   Ht 5\' 5"  (1.651 m)   Wt 87.5 kg   LMP 11/16/2011   SpO2 98%   BMI 32.10 kg/m  Physical Exam Vitals and nursing note reviewed.  Constitutional:      General: She is not in acute distress.    Appearance: She is well-developed.  HENT:     Head: Normocephalic and atraumatic.     Right Ear: External ear normal.     Left Ear: External ear normal.     Nose: Nose normal.  Eyes:     Extraocular Movements: Extraocular movements intact.     Conjunctiva/sclera: Conjunctivae normal.     Pupils: Pupils are equal, round, and reactive to light.  Cardiovascular:     Rate and Rhythm: Normal rate and regular rhythm.     Heart sounds: No murmur heard. Pulmonary:     Effort: Pulmonary effort is normal. No respiratory distress.     Breath sounds: Normal breath sounds.  Abdominal:     General: Abdomen is flat. There is no distension.     Palpations: Abdomen is soft. There is no mass.     Tenderness: There is abdominal tenderness (Diffusely tender worse in the right upper quadrant). There is no guarding.     Comments: See images below.  Wound dehisced.  No active bleeding.  Musculoskeletal:     Cervical back: Normal range of motion and neck supple.      Right lower leg: No edema.     Left lower leg: No edema.  Skin:    General: Skin is warm and dry.  Neurological:     Mental Status: She is alert and oriented to person, place, and time. Mental status is at baseline.  Psychiatric:        Mood and Affect: Mood normal.   Abdominal wound:   ED Results / Procedures / Treatments   Labs (all labs ordered are listed, but only abnormal results are displayed) Labs Reviewed  COMPREHENSIVE METABOLIC PANEL - Abnormal; Notable for the  following components:      Result Value   Sodium 133 (*)    CO2 21 (*)    Glucose, Bld 160 (*)    BUN 25 (*)    Creatinine, Ser 1.62 (*)    AST 45 (*)    GFR, Estimated 36 (*)    All other components within normal limits  CBC - Abnormal; Notable for the following components:   WBC 19.3 (*)    RBC 2.84 (*)    Hemoglobin 9.3 (*)    HCT 26.8 (*)    All other components within normal limits  LIPASE, BLOOD  URINALYSIS, ROUTINE W REFLEX MICROSCOPIC  I-STAT CG4 LACTIC ACID, ED  TROPONIN I (HIGH SENSITIVITY)    EKG EKG Interpretation Date/Time:  Saturday June 30 2023 08:42:54 EST Ventricular Rate:  67 PR Interval:  176 QRS Duration:  120 QT Interval:  541 QTC Calculation: 572 R Axis:   6  Text Interpretation: Sinus rhythm Left ventricular hypertrophy Inferior infarct, old Prolonged QT interval Confirmed by Vonita Moss [phone]) on 06/30/2023 8:47:24 AM  Radiology No results found.  Procedures Procedures  {Document cardiac monitor, telemetry assessment procedure when appropriate:1}  Medications Ordered in ED Medications  HYDROmorphone (DILAUDID) injection 0.5 mg (has no administration in time range)  ondansetron (ZOFRAN) injection 4 mg (has no administration in time range)  sodium chloride 0.9 % bolus 500 mL (has no administration in time range)    ED Course/ Medical Decision Making/ A&P Clinical Course as of 06/30/23 1120  Sat Jun 30, 2023  0949 Hemoglobin(!): 9.3 Baseline of 12 [RP]   [address] Dr Magnus Ivan [RP]  1114 Dr Magnus Ivan [RP]    Clinical Course User Index [RP] Rondel Baton, MD   {   Click here for ABCD2, HEART and other calculatorsREFRESH Note before signing :1}                              Medical Decision Making Amount and/or Complexity of Data Reviewed Labs: ordered. Decision-making details documented in ED Course. Radiology: ordered.  Risk Prescription drug management. Decision regarding hospitalization.   ***  {Document critical care time when appropriate:1} {Document review of labs and clinical decision tools ie heart score, Chads2Vasc2 etc:1}  {Document your independent review of radiology images, and any outside records:1} {Document your discussion with family members, caretakers, and with consultants:1} {Document social determinants of health affecting pt's care:1} {Document your decision making why or why not admission, treatments were needed:1} Final Clinical Impression(s) / ED Diagnoses Final diagnoses:  None    Rx / DC Orders ED Discharge Orders     None

## 2023-06-30 NOTE — ED Provider Notes (Incomplete)
[place] EMERGENCY DEPARTMENT AT [facility] Provider Note   CSN: [id number] Arrival date & time: 06/30/23  1884     History {Add pertinent medical, surgical, social history, OB history to HPI:1} Chief Complaint  Patient presents with   Post-op Problem   Abdominal Pain   Fever   Chest Pain    [name] is a 62 y.o. female.  HPI     Home Medications Prior to Admission medications   Medication Sig Start Date End Date Taking? Authorizing Provider  acetaminophen (TYLENOL) 500 MG tablet Take 2 tablets (1,000 mg total) by mouth every 8 (eight) hours for 5 days. 06/29/23 07/04/23  Gaynelle Adu, MD  albuterol (PROVENTIL HFA;VENTOLIN HFA) 108 (90 BASE) MCG/ACT inhaler Inhale 2 puffs into the lungs every 6 (six) hours as needed for wheezing or shortness of breath.     [provider]  Alirocumab (PRALUENT) 150 MG/ML SOAJ INJECT 1 PEN INTO THE SKIN EVERY 14 DAYS. 02/21/23   Runell Gess, MD  amLODipine (NORVASC) 5 MG tablet Take 1 tablet (5 mg total) by mouth daily. 06/19/22   Runell Gess, MD  aspirin 81 MG chewable tablet Chew 81 mg by mouth daily.    [provider]  atorvastatin (LIPITOR) 80 MG tablet Take 1 tablet (80 mg total) by mouth daily. 08/11/22   Runell Gess, MD  brimonidine (ALPHAGAN) 0.15 % ophthalmic solution Place 1 drop into both eyes 2 (two) times daily.     [provider]  Budeson-Glycopyrrol-Formoterol (BREZTRI AEROSPHERE) 160-9-4.8 MCG/ACT AERO Inhale 2 puffs into the lungs in the morning and at bedtime. 04/30/23   Cobb, Ruby Cola, NP  clopidogrel (PLAVIX) 75 MG tablet Take 1 tablet (75 mg total) by mouth daily. 07/01/23   Gaynelle Adu, MD  escitalopram (LEXAPRO) 20 MG tablet Take 20 mg by mouth daily. 05/31/19   [provider]  fluticasone (FLONASE) 50 MCG/ACT nasal spray Place 2 sprays into both nostrils daily. 05/30/23   Ashok Croon, MD  icosapent Ethyl (VASCEPA) 1 g capsule Take 2 capsules  (2 g total) by mouth 2 (two) times daily. 08/11/22   Runell Gess, MD  isosorbide mononitrate (IMDUR) 30 MG 24 hr tablet TAKE 3 TABLETS BY MOUTH DAILY. 02/21/23   Runell Gess, MD  lamoTRIgine (LAMICTAL) 100 MG tablet Take 100 mg by mouth daily. 08/05/19   [provider]  linaclotide (LINZESS) 145 MCG CAPS capsule Take 1 capsule (145 mcg total) by mouth daily before breakfast. Patient taking differently: Take 145 mcg by mouth daily as needed (constipation). 12/25/22 12/20/23  Zehr, Shanda Bumps D, PA-C  metoprolol tartrate (LOPRESSOR) 25 MG tablet TAKE 1/2 TABLET BY MOUTH TWICE A DAY 10/20/22   Runell Gess, MD  niacin (NIASPAN) 1000 MG CR tablet TAKE 1 TABLET (1,000 MG TOTAL) BY MOUTH AT BEDTIME. 02/21/23   Runell Gess, MD  nitroGLYCERIN (NITROSTAT) 0.4 MG SL tablet Place 1 tablet (0.4 mg total) under the tongue every 5 (five) minutes as needed for chest pain. 07/07/21   Marjie Skiff E, PA-C  nystatin (MYCOSTATIN) 100000 UNIT/ML suspension Take 5 mLs (500,000 Units total) by mouth 4 (four) times daily. 05/29/23   Ashok Croon, MD  pantoprazole (PROTONIX) 40 MG tablet Take 1 tablet (40 mg total) by mouth daily. 04/30/23   Cobb, Ruby Cola, NP  QUEtiapine (SEROQUEL) 100 MG tablet Take 100 mg by mouth at bedtime. 05/31/19   [provider]  ranolazine (RANEXA) 500 MG 12 hr  tablet TAKE 1 TABLET BY MOUTH TWICE A DAY 09/15/22   Runell Gess, MD  traMADol (ULTRAM) 50 MG tablet Take 1 tablet (50 mg total) by mouth every 6 (six) hours as needed for up to 7 days. 06/29/23 07/06/23  Gaynelle Adu, MD  Vitamin D, Ergocalciferol, (DRISDOL) 1.25 MG (50000 UNIT) CAPS capsule Take 50,000 Units by mouth every 7 (seven) days. 04/30/23 04/29/24  [provider]      Allergies    Benadryl [diphenhydramine hcl], Codeine, Haldol [haloperidol decanoate], Amoxicillin, and Bee venom    Review of Systems   Review of Systems  Physical Exam Updated Vital Signs BP 125/84   Pulse 78    Temp 98.6 F (37 C) (Oral)   Resp 20   Ht 5\' 5"  (1.651 m)   Wt 87.5 kg   LMP 11/16/2011   SpO2 98%   BMI 32.10 kg/m  Physical Exam  ED Results / Procedures / Treatments   Labs (all labs ordered are listed, but only abnormal results are displayed) Labs Reviewed  COMPREHENSIVE METABOLIC PANEL - Abnormal; Notable for the following components:      Result Value   Sodium 133 (*)    CO2 21 (*)    Glucose, Bld 160 (*)    BUN 25 (*)    Creatinine, Ser 1.62 (*)    AST 45 (*)    GFR, Estimated 36 (*)    All other components within normal limits  CBC - Abnormal; Notable for the following components:   WBC 19.3 (*)    RBC 2.84 (*)    Hemoglobin 9.3 (*)    HCT 26.8 (*)    All other components within normal limits  LIPASE, BLOOD  URINALYSIS, ROUTINE W REFLEX MICROSCOPIC  I-STAT CG4 LACTIC ACID, ED    EKG EKG Interpretation Date/Time:  Saturday June 30 2023 08:42:54 EST Ventricular Rate:  67 PR Interval:  176 QRS Duration:  120 QT Interval:  541 QTC Calculation: 572 R Axis:   6  Text Interpretation: Sinus rhythm Left ventricular hypertrophy Inferior infarct, old Prolonged QT interval Confirmed by Vonita Moss [phone]) on 06/30/2023 8:47:24 AM  Radiology No results found.  Procedures Procedures  {Document cardiac monitor, telemetry assessment procedure when appropriate:1}  Medications Ordered in ED Medications - No data to display  ED Course/ Medical Decision Making/ A&P   {   Click here for ABCD2, HEART and other calculatorsREFRESH Note before signing :1}                              Medical Decision Making Amount and/or Complexity of Data Reviewed Labs: ordered.   ***  {Document critical care time when appropriate:1} {Document review of labs and clinical decision tools ie heart score, Chads2Vasc2 etc:1}  {Document your independent review of radiology images, and any outside records:1} {Document your discussion with family members, caretakers, and  with consultants:1} {Document social determinants of health affecting pt's care:1} {Document your decision making why or why not admission, treatments were needed:1} Final Clinical Impression(s) / ED Diagnoses Final diagnoses:  None    Rx / DC Orders ED Discharge Orders     None

## 2023-06-30 NOTE — ED Notes (Signed)
ED TO INPATIENT HANDOFF REPORT  ED Nurse Name and Phone #: cat  S Name/Age/Gender [name] 62 y.o. female Room/Bed: WA01/WA01  Code Status   Code Status: Prior  Home/SNF/Other Home Patient oriented to: self, place, time, and situation Is this baseline? Yes   Triage Complete: Triage complete  Chief Complaint Symptomatic anemia [D64.9]  Triage Note Pt BIBA from home. C/o RUQ and chest pain, N/V, and a split umbilical incision after Lap chole yesterday.    Pt states they took a nitroglycerin for the chest pain- hx of angina  Given 4 mg IV Zofran by EMS  Fever of 100.9 at home   Allergies Allergies  Allergen Reactions   Benadryl [Diphenhydramine Hcl] Shortness Of Breath and Swelling    Tongue swelling   Codeine Other (See Comments)    "makes me psychotic"    Haldol [Haloperidol Decanoate] Anaphylaxis   Amoxicillin Hives and Swelling    Swelling to extremities Did it involve swelling of the face/tongue/throat, SOB, or low BP? Yes Did it involve sudden or severe rash/hives, skin peeling, or any reaction on the inside of your mouth or nose? Yes Did you need to seek medical attention at a hospital or doctor's office? Yes When did it last happen?    young adult   If all above answers are "NO", may proceed with cephalosporin use.   Bee Venom Swelling and Other (See Comments)    "pimples" from head to toe and whole body swelling    Level of Care/Admitting Diagnosis ED Disposition     ED Disposition  Admit   Condition  --   Comment  Hospital Area: [facility] [[id number]]  Level of Care: Telemetry Surgical [[id number]]  May place patient in observation at [facility] or [facility] if equivalent level of care is available:: No  Covid Evaluation: Asymptomatic - no recent exposure (last 10 days) testing not required  Diagnosis: Symptomatic anemia [1610960]  Admitting Physician: Abigail Miyamoto [[id number]]  Attending Physician: Rondel Baton  [phone]          B Medical/Surgery History Past Medical History:  Diagnosis Date   Allergy    Anemia    takes iron supplement   Arthritis    knees, shoulders   Asthma    Bipolar 1 disorder (HCC)    Blood transfusion without reported diagnosis    Cataract, immature    CHF (congestive heart failure) (HCC)    Clotting disorder (HCC)    COPD (chronic obstructive pulmonary disease) (HCC)    Coronary artery disease    triple vessel   Depression    Dyspnea    Family history of adverse reaction to anesthesia    states father "did not do well" with anesthesia; died last time he had surgery; also had heart disease   Gallstones    GERD (gastroesophageal reflux disease)    occasional - no current med.   Glaucoma    Hammertoe 05/2015   right second toe   Heart murmur    Hepatic steatosis    High cholesterol    History of MI (myocardial infarction)    Hypertension    states under control with meds., has been on med. x 8 yr.   Myocardial infarction [facility])    Osteoporosis    Panic attacks    Peripheral vascular disease (HCC)    carotid   Renal cyst, left    Schizophrenia (HCC)    Short of breath on exertion  Sleep apnea    Inspire in future   Urinary, incontinence, stress female    Wears dentures    upper   Wears partial dentures    lower   Past Surgical History:  Procedure Laterality Date   C-SECTIONS X 2  1985, 1988   CARDIAC CATHETERIZATION  12/10/2006; 12/18/2006; 07/10/2007; 07/19/2009   CARDIAC CATHETERIZATION  03/04/2010   patent grafts, normal LV function, diffusely diseased RCA (Dr. Erlene Quan)   CARDIAC CATHETERIZATION N/A 02/15/2016   Procedure: Left Heart Cath and Cors/Grafts Angiography;  Surgeon: Peter M Swaziland, MD;  Location: [facility] INVASIVE CV LAB;  Service: Cardiovascular;  Laterality: N/A;   CHOLECYSTECTOMY N/A 06/29/2023   Procedure: LAPAROSCOPIC CHOLECYSTECTOMY WITH ICG DYE;  Surgeon: Gaynelle Adu, MD;  Location: [facility] ORS;  Service: General;  Laterality:  N/A;   CLUB FOOT RELEASE Bilateral    1st and 2nd toes   COLONOSCOPY     CORONARY ARTERY BYPASS GRAFT  07/16/2007   LIMA to LAD, VG to ramus, VG to PDA    CORONARY STENT INTERVENTION N/A 06/23/2019   Procedure: CORONARY STENT INTERVENTION;  Surgeon: Runell Gess, MD;  Location: [facility] INVASIVE CV LAB;  Service: Cardiovascular;  Laterality: N/A;   DRUG INDUCED ENDOSCOPY N/A 05/17/2023   Procedure: DRUG INDUCED ENDOSCOPY;  Surgeon: Oretha Milch, MD;  Location: [facility] ENDOSCOPY;  Service: Pulmonary;  Laterality: N/A;   FLEXIBLE BRONCHOSCOPY  07/16/2007   HAMMERTOE RECONSTRUCTION WITH WEIL OSTEOTOMY Right 06/03/2015   Procedure: RIGHT SECOND HAMMERTOE CORRECTION WITH MT WEIL OSTEOTOMY;  Surgeon: Toni Arthurs, MD;  Location: [facility];  Service: Orthopedics;  Laterality: Right;   HAND SURGERY Bilateral    1st and 2nd fingers - release of clubbing   KNEE ARTHROSCOPY WITH ANTERIOR CRUCIATE LIGAMENT (ACL) REPAIR Right 01/30/2008   LEFT HEART CATH AND CORS/GRAFTS ANGIOGRAPHY N/A 06/23/2019   Procedure: LEFT HEART CATH AND CORS/GRAFTS ANGIOGRAPHY;  Surgeon: Runell Gess, MD;  Location: [facility] INVASIVE CV LAB;  Service: Cardiovascular;  Laterality: N/A;   LEFT HEART CATH AND CORS/GRAFTS ANGIOGRAPHY N/A 03/01/2020   Procedure: LEFT HEART CATH AND CORS/GRAFTS ANGIOGRAPHY;  Surgeon: Lyn Records, MD;  Location: [facility] INVASIVE CV LAB;  Service: Cardiovascular;  Laterality: N/A;   NOVASURE ABLATION  12/19/2007   with removal of IUD   ORIF FOOT FRACTURE Left    TOE SURGERIES TO REMOVE NAILS DUE TO CLUBBING     TOTAL KNEE ARTHROPLASTY Right 10/21/2012   Procedure: TOTAL KNEE ARTHROPLASTY;  Surgeon: Shelda Pal, MD;  Location: [facility] ORS;  Service: Orthopedics;  Laterality: Right;   UPPER GASTROINTESTINAL ENDOSCOPY       A IV Location/Drains/Wounds Patient Lines/Drains/Airways Status     Active Line/Drains/Airways     Name Placement date Placement time Site Days   Peripheral IV 06/30/23  20 G Left;Posterior Hand 06/30/23  0829  Hand  less than 1   Incision - 4 Ports Abdomen 1: Umbilicus 2: Upper 3: Right;Medial 4: Right;Lateral 06/29/23  5784  -- 1            Intake/Output Last 24 hours No intake or output data in the 24 hours ending 06/30/23 1204  Labs/Imaging Results for orders placed or performed during the hospital encounter of 06/30/23 (from the past 48 hour(s))  Lipase, blood     Status: None   Collection Time: 06/30/23  9:08 AM  Result Value Ref Range   Lipase 28 11 - 51 U/L    Comment: Performed at [facility], [address], [place], [place] [address]  Comprehensive metabolic panel     Status: Abnormal   Collection Time: 06/30/23  9:08 AM  Result Value Ref Range   Sodium 133 (L) 135 - 145 mmol/L   Potassium 4.4 3.5 - 5.1 mmol/L   Chloride 100 98 - 111 mmol/L   CO2 21 (L) 22 - 32 mmol/L   Glucose, Bld 160 (H) 70 - 99 mg/dL    Comment: Glucose reference range applies only to samples taken after fasting for at least 8 hours.   BUN 25 (H) 8 - 23 mg/dL   Creatinine, Ser 5.64 (H) 0.44 - 1.00 mg/dL   Calcium 8.9 8.9 - 33.2 mg/dL   Total Protein 6.9 6.5 - 8.1 g/dL   Albumin 3.7 3.5 - 5.0 g/dL   AST 45 (H) 15 - 41 U/L   ALT 37 0 - 44 U/L   Alkaline Phosphatase 96 38 - 126 U/L   Total Bilirubin 0.7 <1.2 mg/dL   GFR, Estimated 36 (L) >60 mL/min    Comment: (NOTE) Calculated using the CKD-EPI Creatinine Equation (2021)    Anion gap 12 5 - 15    Comment: Performed at [facility], [address], [place], [place] [address]  CBC     Status: Abnormal   Collection Time: 06/30/23  9:08 AM  Result Value Ref Range   WBC 19.3 (H) 4.0 - 10.5 K/uL   RBC 2.84 (L) 3.87 - 5.11 MIL/uL   Hemoglobin 9.3 (L) 12.0 - 15.0 g/dL   HCT 41.6 (L) 60.6 - 30.1 %   MCV 94.4 80.0 - 100.0 fL   MCH 32.7 26.0 - 34.0 pg   MCHC 34.7 30.0 - 36.0 g/dL   RDW 60.1 09.3 - 23.5 %   Platelets 215 150 - 400 K/uL   nRBC 0.0 0.0 - 0.2 %    Comment:  Performed at [facility], [address] [place], [place] [address]  Troponin I (High Sensitivity)     Status: Abnormal   Collection Time: 06/30/23  9:08 AM  Result Value Ref Range   Troponin I (High Sensitivity) 396 (HH) <18 ng/L    Comment: CRITICAL RESULT CALLED TO, READ BACK BY AND VERIFIED WITH HOLLATZ, C RN AT 1038 ON 06/30/2023 BY XIONG, K (NOTE) Elevated high sensitivity troponin I (hsTnI) values and significant  changes across serial measurements may suggest ACS but many other  chronic and acute conditions are known to elevate hsTnI results.  Refer to the "Links" section for chest pain algorithms and additional  guidance. Performed at [facility], [address] [place], [place] [address]   I-Stat CG4 Lactic Acid     Status: None   Collection Time: 06/30/23  9:17 AM  Result Value Ref Range   Lactic Acid, Venous 1.9 0.5 - 1.9 mmol/L   DG Chest Portable 1 View  Result Date: 06/30/2023 CLINICAL DATA:  Chest pain. EXAM: PORTABLE CHEST 1 VIEW COMPARISON:  March 05, 2020. FINDINGS: The heart size and mediastinal contours are within normal limits. Status post coronary artery bypass graft. Both lungs are clear. The visualized skeletal structures are unremarkable. IMPRESSION: No active disease. Electronically Signed   By: Lupita Raider M.D.   On: 06/30/2023 11:21   CT ABDOMEN PELVIS W CONTRAST  Result Date: 06/30/2023 CLINICAL DATA:  Right upper quadrant abdominal pain with nausea and vomiting. Recent laparoscopic cholecystectomy. EXAM: CT ABDOMEN AND PELVIS WITH CONTRAST TECHNIQUE: Multidetector  CT imaging of the abdomen and pelvis was performed using the standard protocol following bolus administration of intravenous contrast. RADIATION DOSE REDUCTION: This exam was performed according to the departmental dose-optimization program which includes automated exposure control, adjustment of the mA and/or kV according to patient size and/or use of  iterative reconstruction technique. CONTRAST:  80mL OMNIPAQUE IOHEXOL 300 MG/ML  SOLN COMPARISON:  CT scan 11/26/2020 FINDINGS: Lower chest: Minimal streaky bibasilar atelectasis but no infiltrates or effusions. No pericardial effusion. Hepatobiliary: Surgical changes from recent cholecystectomy. Small amount of free air is noted and not unexpected. There is a significant perihepatic fluid collection extending down the right pericolic gutter and into the pelvis. This is high attenuation and consistent with hematoma. No active extravasation of contrast material is identified. The portal and hepatic veins are patent. Expected surgical changes in the gallbladder fossa with complex fluid collection and surgical clips and gas. No biliary dilatation. Pancreas: Unremarkable. No pancreatic ductal dilatation or surrounding inflammatory changes. Spleen: Normal in size without focal abnormality. Adrenals/Urinary Tract: Small bilateral renal cysts. There is an indeterminate 15 mm lesion involving the upper pole region of the right kidney. This could be a complex cyst. It was present on the prior CT scan from 2022 but has doubled in size. Recommend MR imaging for further evaluation. The bladder is unremarkable. Stomach/Bowel: The stomach, duodenum, small bowel and colon are unremarkable. No acute inflammatory process, mass lesions or obstructive findings. Vascular/Lymphatic: Stable age advanced atherosclerotic calcifications involving the aorta and branch vessels. No abdominal or pelvic lymphadenopathy. Reproductive: No significant findings. Other: None Musculoskeletal: No significant bony findings. Slightly irregular fatty lesion in the adductor magnus muscle on the left. Measures a maximum of 4.4 cm and some heterogeneous signal intensity. Recommend MRI for further evaluation. IMPRESSION: 1. Sizable postoperative hematoma surrounding the liver and extending down into the pelvis. No active extravasation of contrast is  identified. 2. Expected surgical changes in the gallbladder fossa with complex fluid collection and surgical clips and gas. 3. Indeterminate 15 mm lesion involving the upper pole region of the right kidney. This could be a complex cyst. It was present on the prior CT scan from 2022 but has doubled in size. Recommend MR imaging for further evaluation. 4. Slightly irregular 4.4 cm fatty lesion in the adductor magnus muscle on the left. Recommend MRI without and with contrast for further evaluation. 5. Stable age advanced atherosclerotic calcifications involving the aorta and branch vessels. Aortic Atherosclerosis (ICD10-I70.0). Electronically Signed   By: Rudie Meyer M.D.   On: 06/30/2023 11:00    Pending Labs Unresulted Labs (From admission, onward)     Start     Ordered   06/30/23 1610  Urinalysis, Routine w reflex microscopic -Urine, Clean Catch  Once,   URGENT       Question:  Specimen Source  Answer:  Urine, Clean Catch   06/30/23 0838            Vitals/Pain Today's Vitals   06/30/23 0834 06/30/23 0836  BP:  125/84  Pulse:  78  Resp:  20  Temp:  98.6 F (37 C)  TempSrc:  Oral  SpO2:  98%  Weight: 192 lb 14.4 oz (87.5 kg)   Height: 5\' 5"  (1.651 m)   PainSc: 10-Worst pain ever     Isolation Precautions No active isolations  Medications Medications  aspirin chewable tablet 162 mg (has no administration in time range)  HYDROmorphone (DILAUDID) injection 0.5 mg (0.5 mg Intravenous Given 06/30/23 1026)  ondansetron (  ZOFRAN) injection 4 mg (4 mg Intravenous Given 06/30/23 1027)  sodium chloride 0.9 % bolus 500 mL (0 mLs Intravenous Stopped 06/30/23 1204)  iohexol (OMNIPAQUE) 300 MG/ML solution 80 mL (80 mLs Intravenous Contrast Given 06/30/23 1012)    Mobility walks with person assist     Focused Assessments Cardiac Assessment Handoff:    Lab Results  Component Value Date   CKTOTAL 135 07/18/2009   CKMB 4.6 (H) 07/18/2009   TROPONINI <0.01 02/11/2016   Lab Results   Component Value Date   DDIMER (H) 06/26/2007    0.70        AT THE INHOUSE ESTABLISHED CUTOFF VALUE OF 0.48 ug/mL FEU, THIS ASSAY HAS BEEN DOCUMENTED IN THE LITERATURE TO HAVE   Does the Patient currently have chest pain? No    R Recommendations: See Admitting Provider Note  Report given to:   Additional Notes:

## 2023-07-01 ENCOUNTER — Inpatient Hospital Stay (HOSPITAL_COMMUNITY): Payer: Medicare Other

## 2023-07-01 DIAGNOSIS — R7989 Other specified abnormal findings of blood chemistry: Secondary | ICD-10-CM | POA: Diagnosis not present

## 2023-07-01 DIAGNOSIS — I200 Unstable angina: Secondary | ICD-10-CM | POA: Diagnosis not present

## 2023-07-01 DIAGNOSIS — R079 Chest pain, unspecified: Secondary | ICD-10-CM | POA: Diagnosis not present

## 2023-07-01 DIAGNOSIS — D649 Anemia, unspecified: Secondary | ICD-10-CM | POA: Diagnosis not present

## 2023-07-01 LAB — COMPREHENSIVE METABOLIC PANEL
ALT: 31 U/L (ref 0–44)
AST: 34 U/L (ref 15–41)
Albumin: 3 g/dL — ABNORMAL LOW (ref 3.5–5.0)
Alkaline Phosphatase: 81 U/L (ref 38–126)
Anion gap: 8 (ref 5–15)
BUN: 20 mg/dL (ref 8–23)
CO2: 24 mmol/L (ref 22–32)
Calcium: 8.3 mg/dL — ABNORMAL LOW (ref 8.9–10.3)
Chloride: 106 mmol/L (ref 98–111)
Creatinine, Ser: 1.51 mg/dL — ABNORMAL HIGH (ref 0.44–1.00)
GFR, Estimated: 39 mL/min — ABNORMAL LOW (ref >60)
Glucose, Bld: 111 mg/dL — ABNORMAL HIGH (ref 70–99)
Potassium: 4.1 mmol/L (ref 3.5–5.1)
Sodium: 138 mmol/L (ref 135–145)
Total Bilirubin: 0.6 mg/dL (ref <1.2)
Total Protein: 5.7 g/dL — ABNORMAL LOW (ref 6.5–8.1)

## 2023-07-01 LAB — ECHOCARDIOGRAM COMPLETE
AR max vel: 2.06 cm2
AV Area VTI: 2.3 cm2
AV Area mean vel: 2.06 cm2
AV Mean grad: 7 mm[Hg]
AV Peak grad: 14.6 mm[Hg]
Ao pk vel: 1.91 m/s
Area-P 1/2: 2.1 cm2
Height: 65 in
S' Lateral: 3.8 cm
Weight: 3086.44 [oz_av]

## 2023-07-01 LAB — CBC
HCT: 23.5 % — ABNORMAL LOW (ref 36.0–46.0)
Hemoglobin: 8 g/dL — ABNORMAL LOW (ref 12.0–15.0)
MCH: 32.8 pg (ref 26.0–34.0)
MCHC: 34 g/dL (ref 30.0–36.0)
MCV: 96.3 fL (ref 80.0–100.0)
Platelets: 167 10*3/uL (ref 150–400)
RBC: 2.44 MIL/uL — ABNORMAL LOW (ref 3.87–5.11)
RDW: 14.9 % (ref 11.5–15.5)
WBC: 14.9 10*3/uL — ABNORMAL HIGH (ref 4.0–10.5)
nRBC: 0 % (ref 0.0–0.2)

## 2023-07-01 MED ORDER — LAMOTRIGINE 100 MG PO TABS: 100.0000 mg | ORAL_TABLET | Freq: Every day | ORAL | Status: DC

## 2023-07-01 MED ORDER — ATORVASTATIN CALCIUM 80 MG PO TABS
80.0000 mg | ORAL_TABLET | Freq: Every day | ORAL | Status: DC
  Administered 2023-07-01 – 2023-07-03 (×3): 80 mg via ORAL
  Filled 2023-07-01 (×3): qty 1

## 2023-07-01 MED ORDER — LAMOTRIGINE 100 MG PO TABS
100.0000 mg | ORAL_TABLET | Freq: Every day | ORAL | Status: DC
  Administered 2023-07-01 – 2023-07-03 (×3): 100 mg via ORAL
  Filled 2023-07-01 (×3): qty 1

## 2023-07-01 MED ORDER — QUETIAPINE FUMARATE 50 MG PO TABS
150.0000 mg | ORAL_TABLET | Freq: Every day | ORAL | Status: DC
  Administered 2023-07-01 – 2023-07-02 (×3): 150 mg via ORAL
  Filled 2023-07-01 (×3): qty 3

## 2023-07-01 MED ORDER — QUETIAPINE FUMARATE 50 MG PO TABS: 150.0000 mg | ORAL_TABLET | Freq: Every day | ORAL | Status: DC

## 2023-07-01 MED ORDER — ISOSORBIDE MONONITRATE ER 30 MG PO TB24
30.0000 mg | ORAL_TABLET | ORAL | Status: AC
  Administered 2023-07-01: 30 mg via ORAL
  Filled 2023-07-01: qty 1

## 2023-07-01 MED ORDER — ESCITALOPRAM OXALATE 10 MG PO TABS: 20.0000 mg | ORAL_TABLET | Freq: Every day | ORAL | Status: DC

## 2023-07-01 MED ORDER — ISOSORBIDE MONONITRATE ER 60 MG PO TB24
120.0000 mg | ORAL_TABLET | Freq: Every day | ORAL | Status: DC
  Administered 2023-07-02 – 2023-07-03 (×2): 120 mg via ORAL
  Filled 2023-07-01 (×2): qty 2

## 2023-07-01 MED ORDER — ESCITALOPRAM OXALATE 10 MG PO TABS
20.0000 mg | ORAL_TABLET | Freq: Every day | ORAL | Status: DC
  Administered 2023-07-01 – 2023-07-03 (×3): 20 mg via ORAL
  Filled 2023-07-01 (×3): qty 2

## 2023-07-01 NOTE — Progress Notes (Signed)
DAILY PROGRESS NOTE   Patient Name: [name] Date of Encounter: 07/01/2023 Cardiologist: Nanetta Batty, MD  Chief Complaint   Chest pain  Patient Profile   62 yo female s/p lap chole yesterday, who developed nausea and vomiting and was noted to have elevated troponin to 400 and possible ischemic EKG changes   Subjective   On and off chest pain overnight, did not ask for nitroglycerin. Echo pending. Creatinine lower today at 1.52 (from 1.62). There is progressive anemia- H/H 8/23.5, was 9.3 yesterday.  Objective   Vitals:   06/30/23 1607 06/30/23 2147 06/30/23 2319 07/01/23 0410  BP: 129/79 120/72 112/72 104/60  Pulse: 76  61 78  Resp: 18 18 20 20   Temp: 98.6 F (37 C) 98.2 F (36.8 C) 98 F (36.7 C) 98.2 F (36.8 C)  TempSrc: Oral Oral Oral Oral  SpO2: 97% 94% 99%   Weight:      Height:        Intake/Output Summary (Last 24 hours) at 07/01/2023 1034 Last data filed at 07/01/2023 0309 Gross per 24 hour  Intake 1542.37 ml  Output 300 ml  Net 1242.37 ml   Filed Weights   06/30/23 0834  Weight: 87.5 kg    Physical Exam   General appearance: alert and no distress Lungs: clear to auscultation bilaterally Heart: regular rate and rhythm, S1, S2 normal, no murmur, click, rub or gallop Extremities: extremities normal, atraumatic, no cyanosis or edema Neurologic: Grossly normal  Inpatient Medications    Scheduled Meds:  acetaminophen  1,000 mg Oral Q6H   amLODipine  5 mg Oral Daily   aspirin  81 mg Oral Daily   atorvastatin  80 mg Oral Daily   escitalopram  20 mg Oral Daily   isosorbide mononitrate  90 mg Oral Daily   lamoTRIgine  100 mg Oral Daily   metoprolol tartrate  12.5 mg Oral BID   pantoprazole (PROTONIX) IV  40 mg Intravenous QHS   QUEtiapine  150 mg Oral QHS    Continuous Infusions:  cefoTEtan (CEFOTAN) IV 2 g (07/01/23 0913)   dextrose 5 % and 0.9 % NaCl with KCl 20 mEq/L 100 mL/hr at 07/01/23 0353    PRN Meds: albuterol,  HYDROmorphone (DILAUDID) injection, ondansetron **OR** ondansetron (ZOFRAN) IV, oxyCODONE   Labs   Results for orders placed or performed during the hospital encounter of 06/30/23 (from the past 48 hour(s))  Lipase, blood     Status: None   Collection Time: 06/30/23  9:08 AM  Result Value Ref Range   Lipase 28 11 - 51 U/L    Comment: Performed at [facility], [address] [place], [place] [address]  Comprehensive metabolic panel     Status: Abnormal   Collection Time: 06/30/23  9:08 AM  Result Value Ref Range   Sodium 133 (L) 135 - 145 mmol/L   Potassium 4.4 3.5 - 5.1 mmol/L   Chloride 100 98 - 111 mmol/L   CO2 21 (L) 22 - 32 mmol/L   Glucose, Bld 160 (H) 70 - 99 mg/dL    Comment: Glucose reference range applies only to samples taken after fasting for at least 8 hours.   BUN 25 (H) 8 - 23 mg/dL   Creatinine, Ser 4.69 (H) 0.44 - 1.00 mg/dL   Calcium 8.9 8.9 - 62.9 mg/dL   Total Protein 6.9 6.5 - 8.1 g/dL   Albumin 3.7 3.5 - 5.0 g/dL   AST 45 (H) 15 - 41  U/L   ALT 37 0 - 44 U/L   Alkaline Phosphatase 96 38 - 126 U/L   Total Bilirubin 0.7 <1.2 mg/dL   GFR, Estimated 36 (L) >60 mL/min    Comment: (NOTE) Calculated using the CKD-EPI Creatinine Equation (2021)    Anion gap 12 5 - 15    Comment: Performed at [facility], [address], [place], [place] [address]  CBC     Status: Abnormal   Collection Time: 06/30/23  9:08 AM  Result Value Ref Range   WBC 19.3 (H) 4.0 - 10.5 K/uL   RBC 2.84 (L) 3.87 - 5.11 MIL/uL   Hemoglobin 9.3 (L) 12.0 - 15.0 g/dL   HCT 84.6 (L) 96.2 - 95.2 %   MCV 94.4 80.0 - 100.0 fL   MCH 32.7 26.0 - 34.0 pg   MCHC 34.7 30.0 - 36.0 g/dL   RDW 84.1 32.4 - 40.1 %   Platelets 215 150 - 400 K/uL   nRBC 0.0 0.0 - 0.2 %    Comment: Performed at [facility], [address], [place], [place] [address]  Troponin I (High Sensitivity)     Status: Abnormal   Collection Time: 06/30/23  9:08 AM  Result  Value Ref Range   Troponin I (High Sensitivity) 396 (HH) <18 ng/L    Comment: CRITICAL RESULT CALLED TO, READ BACK BY AND VERIFIED WITH HOLLATZ, C RN AT 1038 ON 06/30/2023 BY XIONG, K (NOTE) Elevated high sensitivity troponin I (hsTnI) values and significant  changes across serial measurements may suggest ACS but many other  chronic and acute conditions are known to elevate hsTnI results.  Refer to the "Links" section for chest pain algorithms and additional  guidance. Performed at [facility], [address], [place], [place] [address]   I-Stat CG4 Lactic Acid     Status: None   Collection Time: 06/30/23  9:17 AM  Result Value Ref Range   Lactic Acid, Venous 1.9 0.5 - 1.9 mmol/L  Troponin I (High Sensitivity)     Status: Abnormal   Collection Time: 06/30/23 11:57 AM  Result Value Ref Range   Troponin I (High Sensitivity) 406 (HH) <18 ng/L    Comment: CRITICAL RESULT CALLED TO, READ BACK BY AND VERIFIED WITH Mayford Knife, C RN AT 1249 ON 06/30/2023 BY Deedra Ehrich, K (NOTE) Elevated high sensitivity troponin I (hsTnI) values and significant  changes across serial measurements may suggest ACS but many other  chronic and acute conditions are known to elevate hsTnI results.  Refer to the "Links" section for chest pain algorithms and additional  guidance. Performed at [facility], [address], [place], [place] [address]   Urinalysis, Routine w reflex microscopic -Urine, Clean Catch     Status: Abnormal   Collection Time: 06/30/23 12:49 PM  Result Value Ref Range   Color, Urine STRAW (A) YELLOW   APPearance HAZY (A) CLEAR   Specific Gravity, Urine 1.025 1.005 - 1.030   pH 5.0 5.0 - 8.0   Glucose, UA NEGATIVE NEGATIVE mg/dL   Hgb urine dipstick SMALL (A) NEGATIVE   Bilirubin Urine NEGATIVE NEGATIVE   Ketones, ur NEGATIVE NEGATIVE mg/dL   Protein, ur NEGATIVE NEGATIVE mg/dL   Nitrite NEGATIVE NEGATIVE   Leukocytes,Ua NEGATIVE NEGATIVE   RBC / HPF  0-5 0 - 5 RBC/hpf   WBC, UA 0-5 0 - 5 WBC/hpf   Bacteria, UA NONE SEEN NONE SEEN   Squamous Epithelial / HPF 0-5 0 -  5 /HPF   Mucus PRESENT    Hyaline Casts, UA PRESENT     Comment: Performed at [facility], [address], [place], [place] [address]  HIV Antibody (routine testing w rflx)     Status: None   Collection Time: 06/30/23  1:48 PM  Result Value Ref Range   HIV Screen 4th Generation wRfx Non Reactive Non Reactive    Comment: Performed at [facility], [address] [place], [place] [address]  Comprehensive metabolic panel     Status: Abnormal   Collection Time: 07/01/23  4:10 AM  Result Value Ref Range   Sodium 138 135 - 145 mmol/L   Potassium 4.1 3.5 - 5.1 mmol/L   Chloride 106 98 - 111 mmol/L   CO2 24 22 - 32 mmol/L   Glucose, Bld 111 (H) 70 - 99 mg/dL    Comment: Glucose reference range applies only to samples taken after fasting for at least 8 hours.   BUN 20 8 - 23 mg/dL   Creatinine, Ser 2.69 (H) 0.44 - 1.00 mg/dL   Calcium 8.3 (L) 8.9 - 10.3 mg/dL   Total Protein 5.7 (L) 6.5 - 8.1 g/dL   Albumin 3.0 (L) 3.5 - 5.0 g/dL   AST 34 15 - 41 U/L   ALT 31 0 - 44 U/L   Alkaline Phosphatase 81 38 - 126 U/L   Total Bilirubin 0.6 <1.2 mg/dL   GFR, Estimated 39 (L) >60 mL/min    Comment: (NOTE) Calculated using the CKD-EPI Creatinine Equation (2021)    Anion gap 8 5 - 15    Comment: Performed at [facility], [address] [place], [place] [address]  CBC     Status: Abnormal   Collection Time: 07/01/23  4:10 AM  Result Value Ref Range   WBC 14.9 (H) 4.0 - 10.5 K/uL   RBC 2.44 (L) 3.87 - 5.11 MIL/uL   Hemoglobin 8.0 (L) 12.0 - 15.0 g/dL   HCT 27.0 (L) 35.0 - 09.3 %   MCV 96.3 80.0 - 100.0 fL   MCH 32.8 26.0 - 34.0 pg   MCHC 34.0 30.0 - 36.0 g/dL   RDW 81.8 29.9 - 37.1 %   Platelets 167 150 - 400 K/uL   nRBC 0.0 0.0 - 0.2 %    Comment: Performed at [facility], [address] [place], [place] [address]    ECG   Sinus  rhythm at 65, inferior and anterolateral infarct pattern - Personally Reviewed  Telemetry   Sinus rhythm - Personally Reviewed  Radiology    DG Chest Portable 1 View  Result Date: 06/30/2023 CLINICAL DATA:  Chest pain. EXAM: PORTABLE CHEST 1 VIEW COMPARISON:  March 05, 2020. FINDINGS: The heart size and mediastinal contours are within normal limits. Status post coronary artery bypass graft. Both lungs are clear. The visualized skeletal structures are unremarkable. IMPRESSION: No active disease. Electronically Signed   By: Lupita Raider M.D.   On: 06/30/2023 11:21   CT ABDOMEN PELVIS W CONTRAST  Result Date: 06/30/2023 CLINICAL DATA:  Right upper quadrant abdominal pain with nausea and vomiting. Recent laparoscopic cholecystectomy. EXAM: CT ABDOMEN AND PELVIS WITH CONTRAST TECHNIQUE: Multidetector CT imaging of the abdomen and pelvis was performed using the standard protocol following bolus administration of intravenous contrast. RADIATION DOSE REDUCTION: This exam was performed according to the departmental dose-optimization program which includes automated exposure control, adjustment of the mA and/or kV according to patient size  and/or use of iterative reconstruction technique. CONTRAST:  80mL OMNIPAQUE IOHEXOL 300 MG/ML  SOLN COMPARISON:  CT scan 11/26/2020 FINDINGS: Lower chest: Minimal streaky bibasilar atelectasis but no infiltrates or effusions. No pericardial effusion. Hepatobiliary: Surgical changes from recent cholecystectomy. Small amount of free air is noted and not unexpected. There is a significant perihepatic fluid collection extending down the right pericolic gutter and into the pelvis. This is high attenuation and consistent with hematoma. No active extravasation of contrast material is identified. The portal and hepatic veins are patent. Expected surgical changes in the gallbladder fossa with complex fluid collection and surgical clips and gas. No biliary dilatation. Pancreas:  Unremarkable. No pancreatic ductal dilatation or surrounding inflammatory changes. Spleen: Normal in size without focal abnormality. Adrenals/Urinary Tract: Small bilateral renal cysts. There is an indeterminate 15 mm lesion involving the upper pole region of the right kidney. This could be a complex cyst. It was present on the prior CT scan from 2022 but has doubled in size. Recommend MR imaging for further evaluation. The bladder is unremarkable. Stomach/Bowel: The stomach, duodenum, small bowel and colon are unremarkable. No acute inflammatory process, mass lesions or obstructive findings. Vascular/Lymphatic: Stable age advanced atherosclerotic calcifications involving the aorta and branch vessels. No abdominal or pelvic lymphadenopathy. Reproductive: No significant findings. Other: None Musculoskeletal: No significant bony findings. Slightly irregular fatty lesion in the adductor magnus muscle on the left. Measures a maximum of 4.4 cm and some heterogeneous signal intensity. Recommend MRI for further evaluation. IMPRESSION: 1. Sizable postoperative hematoma surrounding the liver and extending down into the pelvis. No active extravasation of contrast is identified. 2. Expected surgical changes in the gallbladder fossa with complex fluid collection and surgical clips and gas. 3. Indeterminate 15 mm lesion involving the upper pole region of the right kidney. This could be a complex cyst. It was present on the prior CT scan from 2022 but has doubled in size. Recommend MR imaging for further evaluation. 4. Slightly irregular 4.4 cm fatty lesion in the adductor magnus muscle on the left. Recommend MRI without and with contrast for further evaluation. 5. Stable age advanced atherosclerotic calcifications involving the aorta and branch vessels. Aortic Atherosclerosis (ICD10-I70.0). Electronically Signed   By: Rudie Meyer M.D.   On: 06/30/2023 11:00    Cardiac Studies   Echo pending  Assessment   Principal  Problem:   Symptomatic anemia Active Problems:   Postoperative or surgical complication   Plan   On and off chest pain overnight. Has had relief with nitroglycerin before. EKG shows inferior and anterolateral infarct pattern. The anterolateral changes are new. Echo is pending today. She did not have many revascularization options based on her prior cath. She is on imdur 90 mg daily. Will increase to 120 mg daily - can give extra 30 mg today.  Time Spent Directly with Patient:  I have spent a total of 25 minutes with the patient reviewing hospital notes, telemetry, EKGs, labs and examining the patient as well as establishing an assessment and plan that was discussed personally with the patient.  > 50% of time was spent in direct patient care.  Length of Stay:  LOS: 1 day   Chrystie Nose, MD, [facility], FACP  [place]  [facility]  Medical Director of the [facility] Lip[facility] &  Cardiovascular Risk Reduction Clinic Diplomate of the American Board of Clinical Lipidology Attending Cardiologist  Direct Dial: [phone]  Fax: [phone]  Website:  [email or web address] Nicely Florian Chauca 07/01/2023, 10:34 AM

## 2023-07-01 NOTE — Plan of Care (Signed)
  Problem: Clinical Measurements: Goal: Diagnostic test results will improve Outcome: Progressing Goal: Respiratory complications will improve Outcome: Progressing   Problem: Pain Management: Goal: General experience of comfort will improve Outcome: Progressing   Problem: Skin Integrity: Goal: Risk for impaired skin integrity will decrease Outcome: Progressing

## 2023-07-01 NOTE — Progress Notes (Signed)
[facility] Progress Note     Subjective: CC:  Reports bleeding from her incisions. Reports right sided abdominal pain along with nausea and vomiting. She is voiding without reported sxs. Denies chest pain during my exam.  Objective: Vital signs in last 24 hours: Temp:  [97.8 F (36.6 C)-98.6 F (37 C)] 98.2 F (36.8 C) (11/10 0410) Pulse Rate:  [61-78] 78 (11/10 0410) Resp:  [18-21] 20 (11/10 0410) BP: (104-135)/(60-95) 104/60 (11/10 0410) SpO2:  [92 %-99 %] 99 % (11/09 2319) Last BM Date : 06/27/23  Intake/Output from previous day: 11/09 0701 - 11/10 0700 In: 1542.4 [P.O.:360; I.V.:982.3; IV Piggyback:200.1] Out: 300 [Urine:300] Intake/Output this shift: No intake/output data recorded.  PE: Gen:  Alert, appears uncomfortable but no acute distress  Card:  Regular rate and rhythm, Pulm:  Normal effort ORA Abd: Soft, TTP without peritonitis, mild distention. One of her right sided port sites and umbilical port sites have blood-stained gauze.  GU: clear yellow urine. Skin: warm and dry, no rashes  Psych: A&Ox3   Lab Results:  Recent Labs    06/30/23 0908 07/01/23 0410  WBC 19.3* 14.9*  HGB 9.3* 8.0*  HCT 26.8* 23.5*  PLT 215 167   BMET Recent Labs    06/30/23 0908 07/01/23 0410  NA 133* 138  K 4.4 4.1  CL 100 106  CO2 21* 24  GLUCOSE 160* 111*  BUN 25* 20  CREATININE 1.62* 1.51*  CALCIUM 8.9 8.3*   PT/INR No results for input(s): "LABPROT", "INR" in the last 72 hours. CMP     Component Value Date/Time   NA 138 07/01/2023 0410   K 4.1 07/01/2023 0410   CL 106 07/01/2023 0410   CO2 24 07/01/2023 0410   GLUCOSE 111 (H) 07/01/2023 0410   BUN 20 07/01/2023 0410   CREATININE 1.51 (H) 07/01/2023 0410   CREATININE 1.10 (H) 02/11/2016 0921   CALCIUM 8.3 (L) 07/01/2023 0410   PROT 5.7 (L) 07/01/2023 0410   PROT 7.0 10/03/2022 1524   ALBUMIN 3.0 (L) 07/01/2023 0410   ALBUMIN 4.2 10/03/2022 1524   AST 34 07/01/2023 0410   ALT 31 07/01/2023  0410   ALKPHOS 81 07/01/2023 0410   BILITOT 0.6 07/01/2023 0410   BILITOT <0.2 10/03/2022 1524   GFRNONAA 39 (L) 07/01/2023 0410   GFRAA >60 03/07/2020 0313   Lipase     Component Value Date/Time   LIPASE 28 06/30/2023 0908       Studies/Results: DG Chest Portable 1 View  Result Date: 06/30/2023 CLINICAL DATA:  Chest pain. EXAM: PORTABLE CHEST 1 VIEW COMPARISON:  March 05, 2020. FINDINGS: The heart size and mediastinal contours are within normal limits. Status post coronary artery bypass graft. Both lungs are clear. The visualized skeletal structures are unremarkable. IMPRESSION: No active disease. Electronically Signed   By: Lupita Raider M.D.   On: 06/30/2023 11:21   CT ABDOMEN PELVIS W CONTRAST  Result Date: 06/30/2023 CLINICAL DATA:  Right upper quadrant abdominal pain with nausea and vomiting. Recent laparoscopic cholecystectomy. EXAM: CT ABDOMEN AND PELVIS WITH CONTRAST TECHNIQUE: Multidetector CT imaging of the abdomen and pelvis was performed using the standard protocol following bolus administration of intravenous contrast. RADIATION DOSE REDUCTION: This exam was performed according to the departmental dose-optimization program which includes automated exposure control, adjustment of the mA and/or kV according to patient size and/or use of iterative reconstruction technique. CONTRAST:  80mL OMNIPAQUE IOHEXOL 300 MG/ML  SOLN COMPARISON:  CT scan 11/26/2020 FINDINGS: Lower chest:  Minimal streaky bibasilar atelectasis but no infiltrates or effusions. No pericardial effusion. Hepatobiliary: Surgical changes from recent cholecystectomy. Small amount of free air is noted and not unexpected. There is a significant perihepatic fluid collection extending down the right pericolic gutter and into the pelvis. This is high attenuation and consistent with hematoma. No active extravasation of contrast material is identified. The portal and hepatic veins are patent. Expected surgical changes in the  gallbladder fossa with complex fluid collection and surgical clips and gas. No biliary dilatation. Pancreas: Unremarkable. No pancreatic ductal dilatation or surrounding inflammatory changes. Spleen: Normal in size without focal abnormality. Adrenals/Urinary Tract: Small bilateral renal cysts. There is an indeterminate 15 mm lesion involving the upper pole region of the right kidney. This could be a complex cyst. It was present on the prior CT scan from 2022 but has doubled in size. Recommend MR imaging for further evaluation. The bladder is unremarkable. Stomach/Bowel: The stomach, duodenum, small bowel and colon are unremarkable. No acute inflammatory process, mass lesions or obstructive findings. Vascular/Lymphatic: Stable age advanced atherosclerotic calcifications involving the aorta and branch vessels. No abdominal or pelvic lymphadenopathy. Reproductive: No significant findings. Other: None Musculoskeletal: No significant bony findings. Slightly irregular fatty lesion in the adductor magnus muscle on the left. Measures a maximum of 4.4 cm and some heterogeneous signal intensity. Recommend MRI for further evaluation. IMPRESSION: 1. Sizable postoperative hematoma surrounding the liver and extending down into the pelvis. No active extravasation of contrast is identified. 2. Expected surgical changes in the gallbladder fossa with complex fluid collection and surgical clips and gas. 3. Indeterminate 15 mm lesion involving the upper pole region of the right kidney. This could be a complex cyst. It was present on the prior CT scan from 2022 but has doubled in size. Recommend MR imaging for further evaluation. 4. Slightly irregular 4.4 cm fatty lesion in the adductor magnus muscle on the left. Recommend MRI without and with contrast for further evaluation. 5. Stable age advanced atherosclerotic calcifications involving the aorta and branch vessels. Aortic Atherosclerosis (ICD10-I70.0). Electronically Signed   By: Rudie Meyer M.D.   On: 06/30/2023 11:00    Anti-infectives: Anti-infectives (From admission, onward)    Start     Dose/Rate Route Frequency Ordered Stop   06/30/23 1430  cefoTEtan (CEFOTAN) 2 g in sodium chloride 0.9 % 100 mL IVPB        2 g 200 mL/hr over 30 Minutes Intravenous Every 12 hours 06/30/23 1337 07/07/23 0959        Assessment/Plan  Patient status post laparoscopic cholecystectomy 11/8 with postoperative bleed - Hgb 8.0 today from 9.3 yesterday, was 13.9 pre-operatively; trend hgb. Her vitals are stable - check KUB, clinically she has an ileus and may need an NGT - cards following for chest pain, elevated troponin, possible ischemic changes on EKG. Echo today.  - no emergent surgical needs today, will discuss with attending MD.    LOS: 1 day   I reviewed nursing notes, Consultant cardiology notes, hospitalist notes, last 24 h vitals and pain scores, last 48 h intake and output, last 24 h labs and trends, and last 24 h imaging results.  This care required moderate level of medical decision making.   Hosie Spangle, PA-C [facility] Please see Amion for pager number during day hours 7:00am-4:30pm

## 2023-07-02 DIAGNOSIS — D649 Anemia, unspecified: Secondary | ICD-10-CM | POA: Diagnosis not present

## 2023-07-02 DIAGNOSIS — T819XXA Unspecified complication of procedure, initial encounter: Secondary | ICD-10-CM | POA: Diagnosis not present

## 2023-07-02 DIAGNOSIS — I2489 Other forms of acute ischemic heart disease: Secondary | ICD-10-CM | POA: Diagnosis not present

## 2023-07-02 LAB — BASIC METABOLIC PANEL
Anion gap: 10 (ref 5–15)
BUN: 15 mg/dL (ref 8–23)
CO2: 24 mmol/L (ref 22–32)
Calcium: 8.9 mg/dL (ref 8.9–10.3)
Chloride: 103 mmol/L (ref 98–111)
Creatinine, Ser: 1.38 mg/dL — ABNORMAL HIGH (ref 0.44–1.00)
GFR, Estimated: 44 mL/min — ABNORMAL LOW (ref >60)
Glucose, Bld: 123 mg/dL — ABNORMAL HIGH (ref 70–99)
Potassium: 3.7 mmol/L (ref 3.5–5.1)
Sodium: 137 mmol/L (ref 135–145)

## 2023-07-02 LAB — SURGICAL PATHOLOGY

## 2023-07-02 LAB — CBC
HCT: 26.5 % — ABNORMAL LOW (ref 36.0–46.0)
Hemoglobin: 8.6 g/dL — ABNORMAL LOW (ref 12.0–15.0)
MCH: 31.6 pg (ref 26.0–34.0)
MCHC: 32.5 g/dL (ref 30.0–36.0)
MCV: 97.4 fL (ref 80.0–100.0)
Platelets: 214 10*3/uL (ref 150–400)
RBC: 2.72 MIL/uL — ABNORMAL LOW (ref 3.87–5.11)
RDW: 15.1 % (ref 11.5–15.5)
WBC: 16.1 10*3/uL — ABNORMAL HIGH (ref 4.0–10.5)
nRBC: 0.2 % (ref 0.0–0.2)

## 2023-07-02 NOTE — Progress Notes (Signed)
Patient's right lower quadrant began oozing again after being reinforced by the day shift (3rd site). The umbilicus site was changed (4th site). Incision is open on the outside but closed on the inside. Steri-strips applied to both sites and a pressure dressing covering them.

## 2023-07-02 NOTE — Progress Notes (Signed)
Rounding Note    Patient Name: [name] Date of Encounter: 07/02/2023  [facility] Cardiologist: Nanetta Batty, MD   Subjective   She states she is having her usual stable angina. Doing OK on higher dose of imdur. Echo with preserved EF but new WMA  Inpatient Medications    Scheduled Meds:  acetaminophen  1,000 mg Oral Q6H   amLODipine  5 mg Oral Daily   aspirin  81 mg Oral Daily   atorvastatin  80 mg Oral Daily   escitalopram  20 mg Oral Daily   isosorbide mononitrate  120 mg Oral Daily   lamoTRIgine  100 mg Oral Daily   metoprolol tartrate  12.5 mg Oral BID   pantoprazole (PROTONIX) IV  40 mg Intravenous QHS   QUEtiapine  150 mg Oral QHS   Continuous Infusions:  cefoTEtan (CEFOTAN) IV Stopped (07/01/23 2301)   PRN Meds: albuterol, HYDROmorphone (DILAUDID) injection, ondansetron **OR** ondansetron (ZOFRAN) IV, oxyCODONE   Vital Signs    Vitals:   07/01/23 1352 07/01/23 2009 07/01/23 2223 07/02/23 0500  BP:  (!) 145/70 (!) 145/70 (!) 108/94  Pulse:  75 75 90  Resp: 20 18  18   Temp: 97.8 F (36.6 C) 99.4 F (37.4 C)  98.2 F (36.8 C)  TempSrc: Oral Oral  Oral  SpO2:  94%  95%  Weight:      Height:        Intake/Output Summary (Last 24 hours) at 07/02/2023 0754 Last data filed at 07/02/2023 0400 Gross per 24 hour  Intake 1122.15 ml  Output 200 ml  Net 922.15 ml      06/30/2023    8:34 AM 06/29/2023    5:29 AM 06/19/2023    2:17 PM  Last 3 Weights  Weight (lbs) 192 lb 14.4 oz 193 lb 193 lb  Weight (kg) 87.5 kg 87.544 kg 87.544 kg      Telemetry    Sinus rhythm with HR in the 70s - Personally Reviewed  ECG    No new tracings - Personally Reviewed  Physical Exam   GEN: No acute distress.   Neck: No JVD Cardiac: RRR, no murmurs, rubs, or gallops.  Respiratory: Clear to auscultation bilaterally. GI: Soft, nontender, non-distended  MS: No edema; No deformity. Neuro:  Nonfocal  Psych: Normal affect   Labs    High  Sensitivity Troponin:   Recent Labs  Lab 06/30/23 0908 06/30/23 1157  TROPONINIHS 396* 406*     Chemistry Recent Labs  Lab 06/30/23 0908 07/01/23 0410  NA 133* 138  K 4.4 4.1  CL 100 106  CO2 21* 24  GLUCOSE 160* 111*  BUN 25* 20  CREATININE 1.62* 1.51*  CALCIUM 8.9 8.3*  PROT 6.9 5.7*  ALBUMIN 3.7 3.0*  AST 45* 34  ALT 37 31  ALKPHOS 96 81  BILITOT 0.7 0.6  GFRNONAA 36* 39*  ANIONGAP 12 8    Lipids No results for input(s): "CHOL", "TRIG", "HDL", "LABVLDL", "LDLCALC", "CHOLHDL" in the last 168 hours.  Hematology Recent Labs  Lab 06/30/23 0908 07/01/23 0410  WBC 19.3* 14.9*  RBC 2.84* 2.44*  HGB 9.3* 8.0*  HCT 26.8* 23.5*  MCV 94.4 96.3  MCH 32.7 32.8  MCHC 34.7 34.0  RDW 14.5 14.9  PLT 215 167   Thyroid No results for input(s): "TSH", "FREET4" in the last 168 hours.  BNPNo results for input(s): "BNP", "PROBNP" in the last 168 hours.  DDimer No results for input(s): "DDIMER" in the  last 168 hours.   Radiology    DG Abd Portable 1V  Result Date: 07/01/2023 CLINICAL DATA:  Ileus EXAM: PORTABLE ABDOMEN - 1 VIEW COMPARISON:  CT abdomen and pelvis 06/30/2023 FINDINGS: The bowel gas pattern is normal. No radio-opaque calculi. There surgical clips in the right upper quadrant. Visualized lung bases are clear. IMPRESSION: Nonobstructive bowel gas pattern. Electronically Signed   By: Darliss Cheney M.D.   On: 07/01/2023 16:28   ECHOCARDIOGRAM COMPLETE  Result Date: 07/01/2023    ECHOCARDIOGRAM REPORT   Patient Name:   [name] Date of Exam: 07/01/2023 Medical Rec #:  [id number]         Height:       65.0 in Accession #:    [id number]        Weight:       192.9 lb Date of Birth:  01-18-1961         BSA:          1.948 m Patient Age:    62 years          BP:           104/60 mmHg Patient Gender: F                 HR:           67 bpm. Exam Location:  Inpatient Procedure: 2D Echo, Cardiac Doppler and Color Doppler Indications:    Elevated Troponin  History:         Patient has prior history of Echocardiogram examinations, most                 recent 05/25/2021. CAD and NSTEMI, Prior CABG, COPD; Risk                 Factors:Sleep Apnea, Dyslipidemia and Hypertension.  Sonographer:    Milbert Coulter Referring Phys: [id number] KENNETH C HILTY IMPRESSIONS  1. Left ventricular ejection fraction, by estimation, is 50 to 55%. The left ventricle has low normal function. The left ventricle demonstrates regional wall motion abnormalities (see scoring diagram/findings for description). Left ventricular diastolic  parameters were normal.  2. Right ventricular systolic function is normal. The right ventricular size is normal. Tricuspid regurgitation signal is inadequate for assessing PA pressure.  3. Left atrial size was moderately dilated.  4. The mitral valve is normal in structure. No evidence of mitral valve regurgitation. No evidence of mitral stenosis.  5. The aortic valve is tricuspid. Aortic valve regurgitation is not visualized. Aortic valve sclerosis is present, with no evidence of aortic valve stenosis.  6. The inferior vena cava is normal in size with greater than 50% respiratory variability, suggesting right atrial pressure of 3 mmHg. Comparison(s): Prior images reviewed side by side. Direct comparison shows a similar wall motion abnormality on the 2021 study. FINDINGS  Left Ventricle: Left ventricular ejection fraction, by estimation, is 50 to 55%. The left ventricle has low normal function. The left ventricle demonstrates regional wall motion abnormalities. The left ventricular internal cavity size was normal in size. There is no left ventricular hypertrophy. Abnormal (paradoxical) septal motion consistent with post-operative status. Left ventricular diastolic parameters were normal. Normal left ventricular filling pressure.  LV Wall Scoring: The basal inferolateral segment and basal inferior segment are hypokinetic. The entire anterior wall, antero-lateral wall, mid and distal  lateral wall, entire septum, entire apex, and mid and distal inferior wall are normal. Right Ventricle: The right ventricular size is normal. No increase in right ventricular  wall thickness. Right ventricular systolic function is normal. Tricuspid regurgitation signal is inadequate for assessing PA pressure. Left Atrium: Left atrial size was moderately dilated. Right Atrium: Right atrial size was normal in size. Pericardium: There is no evidence of pericardial effusion. Mitral Valve: The mitral valve is normal in structure. No evidence of mitral valve regurgitation. No evidence of mitral valve stenosis. Tricuspid Valve: The tricuspid valve is normal in structure. Tricuspid valve regurgitation is not demonstrated. Aortic Valve: The aortic valve is tricuspid. Aortic valve regurgitation is not visualized. Aortic valve sclerosis is present, with no evidence of aortic valve stenosis. Aortic valve mean gradient measures 7.0 mmHg. Aortic valve peak gradient measures 14.6 mmHg. Aortic valve area, by VTI measures 2.30 cm. Pulmonic Valve: The pulmonic valve was normal in structure. Pulmonic valve regurgitation is trivial. No evidence of pulmonic stenosis. Aorta: The aortic root and ascending aorta are structurally normal, with no evidence of dilitation. Venous: The inferior vena cava is normal in size with greater than 50% respiratory variability, suggesting right atrial pressure of 3 mmHg. IAS/Shunts: No atrial level shunt detected by color flow Doppler.  LEFT VENTRICLE PLAX 2D LVIDd:         4.80 cm   Diastology LVIDs:         3.80 cm   LV e' medial:    7.40 cm/s LV PW:         1.10 cm   LV E/e' medial:  13.0 LV IVS:        1.20 cm   LV e' lateral:   11.00 cm/s LVOT diam:     2.00 cm   LV E/e' lateral: 8.7 LV SV:         83 LV SV Index:   42 LVOT Area:     3.14 cm  RIGHT VENTRICLE RV Basal diam:  3.00 cm RV Mid diam:    2.60 cm RV S prime:     13.70 cm/s TAPSE (M-mode): 1.9 cm LEFT ATRIUM             Index        RIGHT  ATRIUM           Index LA diam:        4.60 cm 2.36 cm/m   RA Area:     16.40 cm LA Vol (A2C):   75.8 ml 38.91 ml/m  RA Volume:   38.20 ml  19.61 ml/m LA Vol (A4C):   57.8 ml 29.67 ml/m LA Biplane Vol: 67.0 ml 34.39 ml/m  AORTIC VALVE AV Area (Vmax):    2.06 cm AV Area (Vmean):   2.06 cm AV Area (VTI):     2.30 cm AV Vmax:           191.00 cm/s AV Vmean:          124.000 cm/s AV VTI:            0.360 m AV Peak Grad:      14.6 mmHg AV Mean Grad:      7.0 mmHg LVOT Vmax:         125.00 cm/s LVOT Vmean:        81.200 cm/s LVOT VTI:          0.263 m LVOT/AV VTI ratio: 0.73  AORTA Ao Root diam: 3.30 cm Ao Asc diam:  3.30 cm MITRAL VALVE MV Area (PHT): 2.10 cm    SHUNTS MV Decel Time: 362 msec    Systemic VTI:  0.26 m MV  E velocity: 95.90 cm/s  Systemic Diam: 2.00 cm MV A velocity: 84.90 cm/s MV E/A ratio:  1.13 Mihai Croitoru MD Electronically signed by Thurmon Fair MD Signature Date/Time: 07/01/2023/3:52:14 PM    Final    DG Chest Portable 1 View  Result Date: 06/30/2023 CLINICAL DATA:  Chest pain. EXAM: PORTABLE CHEST 1 VIEW COMPARISON:  March 05, 2020. FINDINGS: The heart size and mediastinal contours are within normal limits. Status post coronary artery bypass graft. Both lungs are clear. The visualized skeletal structures are unremarkable. IMPRESSION: No active disease. Electronically Signed   By: Lupita Raider M.D.   On: 06/30/2023 11:21   CT ABDOMEN PELVIS W CONTRAST  Result Date: 06/30/2023 CLINICAL DATA:  Right upper quadrant abdominal pain with nausea and vomiting. Recent laparoscopic cholecystectomy. EXAM: CT ABDOMEN AND PELVIS WITH CONTRAST TECHNIQUE: Multidetector CT imaging of the abdomen and pelvis was performed using the standard protocol following bolus administration of intravenous contrast. RADIATION DOSE REDUCTION: This exam was performed according to the departmental dose-optimization program which includes automated exposure control, adjustment of the mA and/or kV according to  patient size and/or use of iterative reconstruction technique. CONTRAST:  80mL OMNIPAQUE IOHEXOL 300 MG/ML  SOLN COMPARISON:  CT scan 11/26/2020 FINDINGS: Lower chest: Minimal streaky bibasilar atelectasis but no infiltrates or effusions. No pericardial effusion. Hepatobiliary: Surgical changes from recent cholecystectomy. Small amount of free air is noted and not unexpected. There is a significant perihepatic fluid collection extending down the right pericolic gutter and into the pelvis. This is high attenuation and consistent with hematoma. No active extravasation of contrast material is identified. The portal and hepatic veins are patent. Expected surgical changes in the gallbladder fossa with complex fluid collection and surgical clips and gas. No biliary dilatation. Pancreas: Unremarkable. No pancreatic ductal dilatation or surrounding inflammatory changes. Spleen: Normal in size without focal abnormality. Adrenals/Urinary Tract: Small bilateral renal cysts. There is an indeterminate 15 mm lesion involving the upper pole region of the right kidney. This could be a complex cyst. It was present on the prior CT scan from 2022 but has doubled in size. Recommend MR imaging for further evaluation. The bladder is unremarkable. Stomach/Bowel: The stomach, duodenum, small bowel and colon are unremarkable. No acute inflammatory process, mass lesions or obstructive findings. Vascular/Lymphatic: Stable age advanced atherosclerotic calcifications involving the aorta and branch vessels. No abdominal or pelvic lymphadenopathy. Reproductive: No significant findings. Other: None Musculoskeletal: No significant bony findings. Slightly irregular fatty lesion in the adductor magnus muscle on the left. Measures a maximum of 4.4 cm and some heterogeneous signal intensity. Recommend MRI for further evaluation. IMPRESSION: 1. Sizable postoperative hematoma surrounding the liver and extending down into the pelvis. No active  extravasation of contrast is identified. 2. Expected surgical changes in the gallbladder fossa with complex fluid collection and surgical clips and gas. 3. Indeterminate 15 mm lesion involving the upper pole region of the right kidney. This could be a complex cyst. It was present on the prior CT scan from 2022 but has doubled in size. Recommend MR imaging for further evaluation. 4. Slightly irregular 4.4 cm fatty lesion in the adductor magnus muscle on the left. Recommend MRI without and with contrast for further evaluation. 5. Stable age advanced atherosclerotic calcifications involving the aorta and branch vessels. Aortic Atherosclerosis (ICD10-I70.0). Electronically Signed   By: Rudie Meyer M.D.   On: 06/30/2023 11:00    Cardiac Studies   Echo 07/01/23:  1. Left ventricular ejection fraction, by estimation, is 50 to  55%. The  left ventricle has low normal function. The left ventricle demonstrates  regional wall motion abnormalities (see scoring diagram/findings for  description). Left ventricular diastolic   parameters were normal.   2. Right ventricular systolic function is normal. The right ventricular  size is normal. Tricuspid regurgitation signal is inadequate for assessing  PA pressure.   3. Left atrial size was moderately dilated.   4. The mitral valve is normal in structure. No evidence of mitral valve  regurgitation. No evidence of mitral stenosis.   5. The aortic valve is tricuspid. Aortic valve regurgitation is not  visualized. Aortic valve sclerosis is present, with no evidence of aortic  valve stenosis.   6. The inferior vena cava is normal in size with greater than 50%  respiratory variability, suggesting right atrial pressure of 3 mmHg.   Patient Profile     62 y.o. female with a history of CAD s/p prior PCI with subsequent CABG x 3 with LIMA-LAD, SVG-ramus, SVG-PDA in 2008, hypertension, hyperlipidemia, and OSA being seen for chest pain in the setting of recent lap chole  complicated by bleeding and anemia.  Assessment & Plan    Chest pain - Troponin 396-->406 - EKG with T wave inversion and minimal ST depression inferior and lateral leads - Echocardiogram with preserved LVEF but wall motion abnormality: Paradoxical septal motion consistent with postop status, basal inferolateral segment and basal inferior segment hypokinetic; the entire anterior wall, anterolateral wall, mid to distal lateral wall, entire septum, entire apex, and mid and distal inferior wall are normal -Imdur has been increased to 120 mg -Last heart catheterization 02/2020 showed patent LIMA-LAD, SVG-ramus, but distally occluded SVG-PDA - Given known disease, suspect troponin elevation likely demand ischemia, however patient continues to have exertional chest pain with walking and new WMA along with EKG changes -- on my exam, she states this is her usual stable angina she's had for years and she understands she may not have suitable targets for revascularization, per prior cath -- will continue 120 mg imdur for now -- can consider nuclear stress test after she has recovered from lap chole, but not sure this is needed without targets for revascularization   CAD Prior PCI CABG times 10/2006 - Maintained on aspirin and Plavix, low-dose beta-blocker, Imdur and amlodipine   Hypertension - 5 mg amlodipine, 90 mg Imdur, 12.5 mg Lopressor twice daily   Recent laparoscopic cholecystectomy Complicated by anemia secondary to postoperative bleed - Hb 8.0 yesterday down from 9.3 - Labs pending today -- per surgery       For questions or updates, please contact [facility] Please consult [email or web address] for contact info under        Signed, Marcelino Duster, PA  07/02/2023, 7:54 AM

## 2023-07-02 NOTE — Plan of Care (Signed)

## 2023-07-02 NOTE — Progress Notes (Signed)
[facility] Progress Note     Subjective: Some nausea, but tolerating CLD which she doesn't like.  Ambulating in hall ways and looks great.  No other new complaints.  +flatus.  States she won't have a BM anywhere but home.  UOP only 200cc documented.  Unclear if this low just due to lack of recordning  Objective: Vital signs in last 24 hours: Temp:  [97.8 F (36.6 C)-99.4 F (37.4 C)] 98.2 F (36.8 C) (11/11 0500) Pulse Rate:  [75-90] 90 (11/11 0500) Resp:  [18-20] 18 (11/11 0500) BP: (108-145)/(70-94) 108/94 (11/11 0500) SpO2:  [94 %-95 %] 95 % (11/11 0500) Last BM Date : 06/27/23  Intake/Output from previous day: 11/10 0701 - 11/11 0700 In: 1122.2 [I.V.:922.2; IV Piggyback:200] Out: 200 [Urine:200] Intake/Output this shift: No intake/output data recorded.  PE: Gen:  Alert, NAD, ambulating in hallways Card:  Regular rate and rhythm, Pulm:  Normal effort ORA Abd: Soft, tender as expected.  Incisions c/d/I with ecchymosis present.  Some dressings in place over umbilicus and right lateral incision.  Bleeding appears to have ceased.  Lab Results:  Recent Labs    07/01/23 0410 07/02/23 0833  WBC 14.9* 16.1*  HGB 8.0* 8.6*  HCT 23.5* 26.5*  PLT 167 214   BMET Recent Labs    06/30/23 0908 07/01/23 0410  NA 133* 138  K 4.4 4.1  CL 100 106  CO2 21* 24  GLUCOSE 160* 111*  BUN 25* 20  CREATININE 1.62* 1.51*  CALCIUM 8.9 8.3*   PT/INR No results for input(s): "LABPROT", "INR" in the last 72 hours. CMP     Component Value Date/Time   NA 138 07/01/2023 0410   K 4.1 07/01/2023 0410   CL 106 07/01/2023 0410   CO2 24 07/01/2023 0410   GLUCOSE 111 (H) 07/01/2023 0410   BUN 20 07/01/2023 0410   CREATININE 1.51 (H) 07/01/2023 0410   CREATININE 1.10 (H) 02/11/2016 0921   CALCIUM 8.3 (L) 07/01/2023 0410   PROT 5.7 (L) 07/01/2023 0410   PROT 7.0 10/03/2022 1524   ALBUMIN 3.0 (L) 07/01/2023 0410   ALBUMIN 4.2 10/03/2022 1524   AST 34 07/01/2023 0410    ALT 31 07/01/2023 0410   ALKPHOS 81 07/01/2023 0410   BILITOT 0.6 07/01/2023 0410   BILITOT <0.2 10/03/2022 1524   GFRNONAA 39 (L) 07/01/2023 0410   GFRAA >60 03/07/2020 0313   Lipase     Component Value Date/Time   LIPASE 28 06/30/2023 0908       Studies/Results: DG Abd Portable 1V  Result Date: 07/01/2023 CLINICAL DATA:  Ileus EXAM: PORTABLE ABDOMEN - 1 VIEW COMPARISON:  CT abdomen and pelvis 06/30/2023 FINDINGS: The bowel gas pattern is normal. No radio-opaque calculi. There surgical clips in the right upper quadrant. Visualized lung bases are clear. IMPRESSION: Nonobstructive bowel gas pattern. Electronically Signed   By: Darliss Cheney M.D.   On: 07/01/2023 16:28   ECHOCARDIOGRAM COMPLETE  Result Date: 07/01/2023    ECHOCARDIOGRAM REPORT   Patient Name:   [name] Date of Exam: 07/01/2023 Medical Rec #:  [id number]         Height:       65.0 in Accession #:    [id number]        Weight:       192.9 lb Date of Birth:  Feb 17, 1961         BSA:          1.948 m Patient Age:  62 years          BP:           104/60 mmHg Patient Gender: F                 HR:           67 bpm. Exam Location:  Inpatient Procedure: 2D Echo, Cardiac Doppler and Color Doppler Indications:    Elevated Troponin  History:        Patient has prior history of Echocardiogram examinations, most                 recent 05/25/2021. CAD and NSTEMI, Prior CABG, COPD; Risk                 Factors:Sleep Apnea, Dyslipidemia and Hypertension.  Sonographer:    Milbert Coulter Referring Phys: [id number] KENNETH C HILTY IMPRESSIONS  1. Left ventricular ejection fraction, by estimation, is 50 to 55%. The left ventricle has low normal function. The left ventricle demonstrates regional wall motion abnormalities (see scoring diagram/findings for description). Left ventricular diastolic  parameters were normal.  2. Right ventricular systolic function is normal. The right ventricular size is normal. Tricuspid regurgitation signal is  inadequate for assessing PA pressure.  3. Left atrial size was moderately dilated.  4. The mitral valve is normal in structure. No evidence of mitral valve regurgitation. No evidence of mitral stenosis.  5. The aortic valve is tricuspid. Aortic valve regurgitation is not visualized. Aortic valve sclerosis is present, with no evidence of aortic valve stenosis.  6. The inferior vena cava is normal in size with greater than 50% respiratory variability, suggesting right atrial pressure of 3 mmHg. Comparison(s): Prior images reviewed side by side. Direct comparison shows a similar wall motion abnormality on the 2021 study. FINDINGS  Left Ventricle: Left ventricular ejection fraction, by estimation, is 50 to 55%. The left ventricle has low normal function. The left ventricle demonstrates regional wall motion abnormalities. The left ventricular internal cavity size was normal in size. There is no left ventricular hypertrophy. Abnormal (paradoxical) septal motion consistent with post-operative status. Left ventricular diastolic parameters were normal. Normal left ventricular filling pressure.  LV Wall Scoring: The basal inferolateral segment and basal inferior segment are hypokinetic. The entire anterior wall, antero-lateral wall, mid and distal lateral wall, entire septum, entire apex, and mid and distal inferior wall are normal. Right Ventricle: The right ventricular size is normal. No increase in right ventricular wall thickness. Right ventricular systolic function is normal. Tricuspid regurgitation signal is inadequate for assessing PA pressure. Left Atrium: Left atrial size was moderately dilated. Right Atrium: Right atrial size was normal in size. Pericardium: There is no evidence of pericardial effusion. Mitral Valve: The mitral valve is normal in structure. No evidence of mitral valve regurgitation. No evidence of mitral valve stenosis. Tricuspid Valve: The tricuspid valve is normal in structure. Tricuspid valve  regurgitation is not demonstrated. Aortic Valve: The aortic valve is tricuspid. Aortic valve regurgitation is not visualized. Aortic valve sclerosis is present, with no evidence of aortic valve stenosis. Aortic valve mean gradient measures 7.0 mmHg. Aortic valve peak gradient measures 14.6 mmHg. Aortic valve area, by VTI measures 2.30 cm. Pulmonic Valve: The pulmonic valve was normal in structure. Pulmonic valve regurgitation is trivial. No evidence of pulmonic stenosis. Aorta: The aortic root and ascending aorta are structurally normal, with no evidence of dilitation. Venous: The inferior vena cava is normal in size with greater than 50% respiratory variability, suggesting right  atrial pressure of 3 mmHg. IAS/Shunts: No atrial level shunt detected by color flow Doppler.  LEFT VENTRICLE PLAX 2D LVIDd:         4.80 cm   Diastology LVIDs:         3.80 cm   LV e' medial:    7.40 cm/s LV PW:         1.10 cm   LV E/e' medial:  13.0 LV IVS:        1.20 cm   LV e' lateral:   11.00 cm/s LVOT diam:     2.00 cm   LV E/e' lateral: 8.7 LV SV:         83 LV SV Index:   42 LVOT Area:     3.14 cm  RIGHT VENTRICLE RV Basal diam:  3.00 cm RV Mid diam:    2.60 cm RV S prime:     13.70 cm/s TAPSE (M-mode): 1.9 cm LEFT ATRIUM             Index        RIGHT ATRIUM           Index LA diam:        4.60 cm 2.36 cm/m   RA Area:     16.40 cm LA Vol (A2C):   75.8 ml 38.91 ml/m  RA Volume:   38.20 ml  19.61 ml/m LA Vol (A4C):   57.8 ml 29.67 ml/m LA Biplane Vol: 67.0 ml 34.39 ml/m  AORTIC VALVE AV Area (Vmax):    2.06 cm AV Area (Vmean):   2.06 cm AV Area (VTI):     2.30 cm AV Vmax:           191.00 cm/s AV Vmean:          124.000 cm/s AV VTI:            0.360 m AV Peak Grad:      14.6 mmHg AV Mean Grad:      7.0 mmHg LVOT Vmax:         125.00 cm/s LVOT Vmean:        81.200 cm/s LVOT VTI:          0.263 m LVOT/AV VTI ratio: 0.73  AORTA Ao Root diam: 3.30 cm Ao Asc diam:  3.30 cm MITRAL VALVE MV Area (PHT): 2.10 cm    SHUNTS MV  Decel Time: 362 msec    Systemic VTI:  0.26 m MV E velocity: 95.90 cm/s  Systemic Diam: 2.00 cm MV A velocity: 84.90 cm/s MV E/A ratio:  1.13 Mihai Croitoru MD Electronically signed by Thurmon Fair MD Signature Date/Time: 07/01/2023/3:52:14 PM    Final    DG Chest Portable 1 View  Result Date: 06/30/2023 CLINICAL DATA:  Chest pain. EXAM: PORTABLE CHEST 1 VIEW COMPARISON:  March 05, 2020. FINDINGS: The heart size and mediastinal contours are within normal limits. Status post coronary artery bypass graft. Both lungs are clear. The visualized skeletal structures are unremarkable. IMPRESSION: No active disease. Electronically Signed   By: Lupita Raider M.D.   On: 06/30/2023 11:21   CT ABDOMEN PELVIS W CONTRAST  Result Date: 06/30/2023 CLINICAL DATA:  Right upper quadrant abdominal pain with nausea and vomiting. Recent laparoscopic cholecystectomy. EXAM: CT ABDOMEN AND PELVIS WITH CONTRAST TECHNIQUE: Multidetector CT imaging of the abdomen and pelvis was performed using the standard protocol following bolus administration of intravenous contrast. RADIATION DOSE REDUCTION: This exam was performed according to the departmental dose-optimization program  which includes automated exposure control, adjustment of the mA and/or kV according to patient size and/or use of iterative reconstruction technique. CONTRAST:  80mL OMNIPAQUE IOHEXOL 300 MG/ML  SOLN COMPARISON:  CT scan 11/26/2020 FINDINGS: Lower chest: Minimal streaky bibasilar atelectasis but no infiltrates or effusions. No pericardial effusion. Hepatobiliary: Surgical changes from recent cholecystectomy. Small amount of free air is noted and not unexpected. There is a significant perihepatic fluid collection extending down the right pericolic gutter and into the pelvis. This is high attenuation and consistent with hematoma. No active extravasation of contrast material is identified. The portal and hepatic veins are patent. Expected surgical changes in the  gallbladder fossa with complex fluid collection and surgical clips and gas. No biliary dilatation. Pancreas: Unremarkable. No pancreatic ductal dilatation or surrounding inflammatory changes. Spleen: Normal in size without focal abnormality. Adrenals/Urinary Tract: Small bilateral renal cysts. There is an indeterminate 15 mm lesion involving the upper pole region of the right kidney. This could be a complex cyst. It was present on the prior CT scan from 2022 but has doubled in size. Recommend MR imaging for further evaluation. The bladder is unremarkable. Stomach/Bowel: The stomach, duodenum, small bowel and colon are unremarkable. No acute inflammatory process, mass lesions or obstructive findings. Vascular/Lymphatic: Stable age advanced atherosclerotic calcifications involving the aorta and branch vessels. No abdominal or pelvic lymphadenopathy. Reproductive: No significant findings. Other: None Musculoskeletal: No significant bony findings. Slightly irregular fatty lesion in the adductor magnus muscle on the left. Measures a maximum of 4.4 cm and some heterogeneous signal intensity. Recommend MRI for further evaluation. IMPRESSION: 1. Sizable postoperative hematoma surrounding the liver and extending down into the pelvis. No active extravasation of contrast is identified. 2. Expected surgical changes in the gallbladder fossa with complex fluid collection and surgical clips and gas. 3. Indeterminate 15 mm lesion involving the upper pole region of the right kidney. This could be a complex cyst. It was present on the prior CT scan from 2022 but has doubled in size. Recommend MR imaging for further evaluation. 4. Slightly irregular 4.4 cm fatty lesion in the adductor magnus muscle on the left. Recommend MRI without and with contrast for further evaluation. 5. Stable age advanced atherosclerotic calcifications involving the aorta and branch vessels. Aortic Atherosclerosis (ICD10-I70.0). Electronically Signed   By: Rudie Meyer M.D.   On: 06/30/2023 11:00    Anti-infectives: Anti-infectives (From admission, onward)    Start     Dose/Rate Route Frequency Ordered Stop   06/30/23 1430  cefoTEtan (CEFOTAN) 2 g in sodium chloride 0.9 % 100 mL IVPB        2 g 200 mL/hr over 30 Minutes Intravenous Every 12 hours 06/30/23 1337 07/07/23 0959        Assessment/Plan POD 3, S/p laparoscopic cholecystectomy 11/8 with postoperative bleed, Dr. Andrey Campanile - Hgb 8.6 today from 8.0 yesterday.  No plans to transfuse at this time.  Suspect everything is equilibrating.  - nausea is improving, wants solid diet.  + flatus - cards following for chest pain, elevated troponin, possible ischemic changes on EKG. Cards suspects demand ischemia at etiology of troponin leak - continue to monitor -awaiting BMET today to follow Cr. Pending findings, may resume low rate IVFs -WBC up to 16K from 14K.  No overt signs of infection.  May be inflammatory/post op in nature.  Monitor.  FEN - HH diet ID - none currently needed VTE -  on hold due to above   LOS: 2 days   I  reviewed nursing notes, Consultant cardiology notes, last 24 h vitals and pain scores, last 48 h intake and output, last 24 h labs and trends, and last 24 h imaging results.  Letha Cape, [facility] Please see Amion for pager number during day hours 7:00am-4:30pm

## 2023-07-02 NOTE — Progress Notes (Signed)
Patient walked to laps on the unit and became dizzy and started having 7/10 chest pain/pressure. Vitals taken. See mar. While at rest patient's pain is 2/10.

## 2023-07-03 ENCOUNTER — Other Ambulatory Visit: Payer: Self-pay | Admitting: Cardiovascular Disease

## 2023-07-03 DIAGNOSIS — K219 Gastro-esophageal reflux disease without esophagitis: Secondary | ICD-10-CM

## 2023-07-03 DIAGNOSIS — I2489 Other forms of acute ischemic heart disease: Secondary | ICD-10-CM | POA: Diagnosis not present

## 2023-07-03 DIAGNOSIS — D649 Anemia, unspecified: Secondary | ICD-10-CM | POA: Diagnosis not present

## 2023-07-03 LAB — CBC
HCT: 26 % — ABNORMAL LOW (ref 36.0–46.0)
Hemoglobin: 8.5 g/dL — ABNORMAL LOW (ref 12.0–15.0)
MCH: 31.8 pg (ref 26.0–34.0)
MCHC: 32.7 g/dL (ref 30.0–36.0)
MCV: 97.4 fL (ref 80.0–100.0)
Platelets: 225 10*3/uL (ref 150–400)
RBC: 2.67 MIL/uL — ABNORMAL LOW (ref 3.87–5.11)
RDW: 14.9 % (ref 11.5–15.5)
WBC: 11 10*3/uL — ABNORMAL HIGH (ref 4.0–10.5)
nRBC: 0.9 % — ABNORMAL HIGH (ref 0.0–0.2)

## 2023-07-03 LAB — BASIC METABOLIC PANEL
Anion gap: 7 (ref 5–15)
BUN: 18 mg/dL (ref 8–23)
CO2: 27 mmol/L (ref 22–32)
Calcium: 8.5 mg/dL — ABNORMAL LOW (ref 8.9–10.3)
Chloride: 104 mmol/L (ref 98–111)
Creatinine, Ser: 1.29 mg/dL — ABNORMAL HIGH (ref 0.44–1.00)
GFR, Estimated: 47 mL/min — ABNORMAL LOW (ref >60)
Glucose, Bld: 102 mg/dL — ABNORMAL HIGH (ref 70–99)
Potassium: 3.3 mmol/L — ABNORMAL LOW (ref 3.5–5.1)
Sodium: 138 mmol/L (ref 135–145)

## 2023-07-03 MED ORDER — RANOLAZINE ER 500 MG PO TB12
500.0000 mg | ORAL_TABLET | Freq: Two times a day (BID) | ORAL | Status: DC
  Administered 2023-07-03: 500 mg via ORAL
  Filled 2023-07-03: qty 1

## 2023-07-03 MED ORDER — POTASSIUM CHLORIDE CRYS ER 20 MEQ PO TBCR
40.0000 meq | EXTENDED_RELEASE_TABLET | Freq: Once | ORAL | Status: AC
  Administered 2023-07-03: 40 meq via ORAL
  Filled 2023-07-03: qty 2

## 2023-07-03 NOTE — Discharge Summary (Signed)
Patient ID: [name] [id number] Apr 30, 1961 62 y.o.  Admit date: 06/30/2023 Discharge date: 07/03/2023  Admitting Diagnosis: S/p lap chole with post op bleed  Discharge Diagnosis Patient Active Problem List   Diagnosis Date Noted   Symptomatic anemia 06/30/2023   Postoperative or surgical complication 06/30/2023   COPD with chronic bronchitis and emphysema (HCC) 05/02/2023   GERD (gastroesophageal reflux disease) 05/02/2023   OSA (obstructive sleep apnea) 05/02/2023   Current smoker 05/02/2023   Calculus of gallbladder without cholecystitis without obstruction 12/25/2022   RUQ abdominal pain 12/25/2022   Dysphagia 12/25/2022   History of Helicobacter pylori infection 12/25/2022   Chronic constipation 04/15/2020   CAD- S/P PCI 07/03/2019   Hx of CABG 07/03/2019   History of non-ST elevation myocardial infarction (NSTEMI)    Essential hypertension 03/17/2015   Hyperlipidemia LDL goal <70 12/24/2013   Coronary artery disease involving coronary bypass graft 12/24/2013   Obesity 10/22/2012   S/P right TKA 10/21/2012    Consultants cardiology  Reason for Admission: This is a 62 year old female with multiple and medical problems who is status post a laparoscopic cholecystectomy yesterday by Dr. Andrey Campanile. Last evening she started developing nausea and vomiting. This caused the umbilical incision to come open. She called the office this morning and we recommended that she proceed to the emergency room. She reports mild to moderate abdominal pain. She denies chest pain or shortness of breath.   Procedures none  Hospital Course:  The patient was admitted and her umbilical incision was closed with dermabond and dressing placed over these areas.  CT scan revealed a hematoma around the liver and in the pelvis but no extravasation.  She was noted though to have elevation in her troponins.  Given her heart history, cardiology was consulted who felt this was a troponin leak  secondary to demand ischemia from bleeding incident.  Her hgb stabilized around 8.6 with no needs for transfusion.  Her diet was advanced and she otherwise remained stable.  She was surgically and medically stable for DC home on HD3.   Physical Exam: Gen: NAD Lungs: nonlabored on room air Abd: soft, appropriately tender, no bleeding from incisions, gauze and tegaderm present over umbilical and lateral incision  Allergies as of 07/03/2023       Reactions   Benadryl [diphenhydramine Hcl] Shortness Of Breath, Swelling   Tongue swelling   Codeine Other (See Comments)   "makes me psychotic"   Haldol [haloperidol Decanoate] Anaphylaxis   Amoxicillin Hives, Swelling   Swelling to extremities Did it involve swelling of the face/tongue/throat, SOB, or low BP? Yes Did it involve sudden or severe rash/hives, skin peeling, or any reaction on the inside of your mouth or nose? Yes Did you need to seek medical attention at a hospital or doctor's office? Yes When did it last happen?    young adult   If all above answers are "NO", may proceed with cephalosporin use.   Bee Venom Swelling, Other (See Comments)   "pimples" from head to toe and whole body swelling        Medication List     STOP taking these medications    clopidogrel 75 MG tablet Commonly known as: Plavix       TAKE these medications    acetaminophen 500 MG tablet Commonly known as: TYLENOL Take 2 tablets (1,000 mg total) by mouth every 8 (eight) hours for 5 days.   albuterol 108 (90 Base) MCG/ACT inhaler Commonly known as: VENTOLIN HFA Inhale  2 puffs into the lungs every 6 (six) hours as needed for wheezing or shortness of breath.   amLODipine 5 MG tablet Commonly known as: NORVASC Take 1 tablet (5 mg total) by mouth daily.   aspirin 81 MG chewable tablet Chew 81 mg by mouth in the morning and at bedtime.   atorvastatin 80 MG tablet Commonly known as: LIPITOR Take 1 tablet (80 mg total) by mouth daily.    Breztri Aerosphere 160-9-4.8 MCG/ACT Aero Generic drug: Budeson-Glycopyrrol-Formoterol Inhale 2 puffs into the lungs in the morning and at bedtime.   brimonidine 0.15 % ophthalmic solution Commonly known as: ALPHAGAN Place 1 drop into both eyes 2 (two) times daily.   escitalopram 20 MG tablet Commonly known as: LEXAPRO Take 20 mg by mouth daily.   fluticasone 50 MCG/ACT nasal spray Commonly known as: FLONASE Place 2 sprays into both nostrils daily.   icosapent Ethyl 1 g capsule Commonly known as: Vascepa Take 2 capsules (2 g total) by mouth 2 (two) times daily. What changed: how much to take   isosorbide mononitrate 30 MG 24 hr tablet Commonly known as: IMDUR TAKE 3 TABLETS BY MOUTH DAILY.   lamoTRIgine 100 MG tablet Commonly known as: LAMICTAL Take 100 mg by mouth daily.   linaclotide 145 MCG Caps capsule Commonly known as: Linzess Take 1 capsule (145 mcg total) by mouth daily before breakfast. What changed:  when to take this reasons to take this   metoprolol tartrate 25 MG tablet Commonly known as: LOPRESSOR TAKE 1/2 TABLET BY MOUTH TWICE A DAY   niacin 1000 MG CR tablet Commonly known as: NIASPAN TAKE 1 TABLET (1,000 MG TOTAL) BY MOUTH AT BEDTIME.   nitroGLYCERIN 0.4 MG SL tablet Commonly known as: NITROSTAT Place 1 tablet (0.4 mg total) under the tongue every 5 (five) minutes as needed for chest pain.   pantoprazole 40 MG tablet Commonly known as: Protonix Take 1 tablet (40 mg total) by mouth daily.   Praluent 150 MG/ML Soaj Generic drug: Alirocumab INJECT 1 PEN INTO THE SKIN EVERY 14 DAYS.   QUEtiapine Fumarate 150 MG Tabs Take 1 tablet by mouth at bedtime.   ranolazine 500 MG 12 hr tablet Commonly known as: RANEXA TAKE 1 TABLET BY MOUTH TWICE A DAY   traMADol 50 MG tablet Commonly known as: Ultram Take 1 tablet (50 mg total) by mouth every 6 (six) hours as needed for up to 7 days.   VITAMIN C PO Take 1 tablet by mouth daily.   Vitamin D  (Ergocalciferol) 1.25 MG (50000 UNIT) Caps capsule Commonly known as: DRISDOL Take 50,000 Units by mouth every 7 (seven) days.   VITAMIN E PO Take 1 tablet by mouth daily.          Follow-up Information     Gaynelle Adu, MD. Call in 3 week(s).   Specialty: General Surgery Why: Call to schedule a follow up appointment with Dr. Andrey Campanile for 3 weeks away Contact information: [address] [place] [address] [phone]         Macy Mis, MD Follow up.   Specialty: Family Medicine Why: As needed Contact information: [address] [place] [address] [phone]         Runell Gess, MD Follow up.   Specialties: Cardiology, Radiology Why: Office will call you with a follow up appointment Contact information: [address] [place] [address] [phone]  Signed: Barnetta Chapel, [facility] 07/03/2023, 9:52 AM Please see Amion for pager number during day hours 7:00am-4:30pm, 7-11:30am on Weekends

## 2023-07-03 NOTE — Care Management Important Message (Signed)
Important Message  Patient Details  Name: [name] MRN: [id number] Date of Birth: 1961/03/28   Important Message Given:  Yes - Medicare IM  Patient discharges IM mailed to the patient home address.    Carollee Nussbaumer 07/03/2023, 1:19 PM

## 2023-07-03 NOTE — Discharge Instructions (Addendum)
Hold you plavix for 1 week following bleeding episode.  You may resume after that time period.  [facility]  Please arrive at least 30 min before your appointment to complete your check in paperwork.  If you are unable to arrive 30 min prior to your appointment time we may have to cancel or reschedule you. LAPAROSCOPIC SURGERY: POST OP INSTRUCTIONS Always review your discharge instruction sheet given to you by the facility where your surgery was performed. IF YOU HAVE DISABILITY OR FAMILY LEAVE FORMS, YOU MUST BRING THEM TO THE OFFICE FOR PROCESSING.   DO NOT GIVE THEM TO YOUR DOCTOR.  PAIN CONTROL  First take acetaminophen (Tylenol) AND/or ibuprofen (Advil) to control your pain after surgery.  Follow directions on package.  Taking acetaminophen (Tylenol) and/or ibuprofen (Advil) regularly after surgery will help to control your pain and lower the amount of prescription pain medication you may need.  You should not take more than 4,000 mg (4 grams) of acetaminophen (Tylenol) in 24 hours.  You should not take ibuprofen (Advil), aleve, motrin, naprosyn or other NSAIDS if you have a history of stomach ulcers or chronic kidney disease.  A prescription for pain medication may be given to you upon discharge.  Take your pain medication as prescribed, if you still have uncontrolled pain after taking acetaminophen (Tylenol) or ibuprofen (Advil). Use ice packs to help control pain. If you need a refill on your pain medication, please contact your pharmacy.  They will contact our office to request authorization. Prescriptions will not be filled after 5pm or on week-ends.  HOME MEDICATIONS Take your usually prescribed medications unless otherwise directed.  DIET You should follow a light diet the first few days after arrival home.  Be sure to include lots of fluids daily. Avoid fatty, fried foods.   CONSTIPATION It is common to experience some constipation after surgery and if you  are taking pain medication.  Increasing fluid intake and taking a stool softener (such as Colace) will usually help or prevent this problem from occurring.  A mild laxative (Milk of Magnesia or Miralax) should be taken according to package instructions if there are no bowel movements after 48 hours.  WOUND/INCISION CARE Most patients will experience some swelling and bruising in the area of the incisions.  Ice packs will help.  Swelling and bruising can take several days to resolve.  Unless discharge instructions indicate otherwise, follow guidelines below  STERI-STRIPS - you may remove your outer bandages 48 hours after surgery, and you may shower at that time.  You have steri-strips (small skin tapes) in place directly over the incision.  These strips should be left on the skin for 7-10 days.   DERMABOND/SKIN GLUE - you may shower in 24 hours.  The glue will flake off over the next 2-3 weeks. Any sutures or staples will be removed at the office during your follow-up visit.  ACTIVITIES You may resume regular (light) daily activities beginning the next day--such as daily self-care, walking, climbing stairs--gradually increasing activities as tolerated.  You may have sexual intercourse when it is comfortable.  Refrain from any heavy lifting or straining until approved by your doctor. You may drive when you are no longer taking prescription pain medication, you can comfortably wear a seatbelt, and you can safely maneuver your car and apply brakes.  FOLLOW-UP You should see your doctor in the office for a follow-up appointment approximately 2-3 weeks after your surgery.  You should have been given your  post-op/follow-up appointment when your surgery was scheduled.  If you did not receive a post-op/follow-up appointment, make sure that you call for this appointment within a day or two after you arrive home to insure a convenient appointment time.   WHEN TO CALL YOUR DOCTOR: Fever over 101.0 Inability  to urinate Continued bleeding from incision. Increased pain, redness, or drainage from the incision. Increasing abdominal pain  The clinic staff is available to answer your questions during regular business hours.  Please don't hesitate to call and ask to speak to one of the nurses for clinical concerns.  If you have a medical emergency, go to the nearest emergency room or call 911.  A surgeon from [facility] is always on call at the hospital. [address], [address], [place], [place]  [address] ? [address] [address], [place], [place]   [address] [phone] ? [phone] ? FAX [phone]

## 2023-07-03 NOTE — Telephone Encounter (Signed)
Sent RX to pharmd

## 2023-07-03 NOTE — Progress Notes (Signed)
Rounding Note    Patient Name: [name] Date of Encounter: 07/03/2023  [facility] Cardiologist: Nanetta Batty, MD   Subjective   She continues to have stable angina. With further questioning she states this is her usual angina and is not worse than her baseline. She does not think the increased imdur has helped. We discussed trialing ranexa and she is in agreement.   Inpatient Medications    Scheduled Meds:  acetaminophen  1,000 mg Oral Q6H   amLODipine  5 mg Oral Daily   aspirin  81 mg Oral Daily   atorvastatin  80 mg Oral Daily   escitalopram  20 mg Oral Daily   isosorbide mononitrate  120 mg Oral Daily   lamoTRIgine  100 mg Oral Daily   metoprolol tartrate  12.5 mg Oral BID   pantoprazole (PROTONIX) IV  40 mg Intravenous QHS   potassium chloride  40 mEq Oral Once   QUEtiapine  150 mg Oral QHS   Continuous Infusions:  cefoTEtan (CEFOTAN) IV 2 g (07/02/23 2142)   PRN Meds: albuterol, HYDROmorphone (DILAUDID) injection, ondansetron **OR** ondansetron (ZOFRAN) IV, oxyCODONE   Vital Signs    Vitals:   07/02/23 1228 07/02/23 1939 07/02/23 2135 07/03/23 0441  BP: 101/61 122/66 129/65 (!) 141/70  Pulse: 64 62 70 63  Resp: 18 18  18   Temp: 98.5 F (36.9 C) 98.3 F (36.8 C)  98.1 F (36.7 C)  TempSrc: Oral Oral  Oral  SpO2:  90%  96%  Weight:      Height:        Intake/Output Summary (Last 24 hours) at 07/03/2023 0900 Last data filed at 07/02/2023 1835 Gross per 24 hour  Intake 100 ml  Output --  Net 100 ml      06/30/2023    8:34 AM 06/29/2023    5:29 AM 06/19/2023    2:17 PM  Last 3 Weights  Weight (lbs) 192 lb 14.4 oz 193 lb 193 lb  Weight (kg) 87.5 kg 87.544 kg 87.544 kg      Telemetry    Sinus rhythm with HR 80s - Personally Reviewed  ECG    No new tracings - Personally Reviewed  Physical Exam   GEN: No acute distress.   Neck: No JVD Cardiac: RRR, no murmurs, rubs, or gallops.  Respiratory: Clear to auscultation  bilaterally. GI: Soft, nontender, non-distended  MS: No edema; No deformity. Neuro:  Nonfocal  Psych: Normal affect   Labs    High Sensitivity Troponin:   Recent Labs  Lab 06/30/23 0908 06/30/23 1157  TROPONINIHS 396* 406*     Chemistry Recent Labs  Lab 06/30/23 0908 07/01/23 0410 07/02/23 0833 07/03/23 0548  NA 133* 138 137 138  K 4.4 4.1 3.7 3.3*  CL 100 106 103 104  CO2 21* 24 24 27   GLUCOSE 160* 111* 123* 102*  BUN 25* 20 15 18   CREATININE 1.62* 1.51* 1.38* 1.29*  CALCIUM 8.9 8.3* 8.9 8.5*  PROT 6.9 5.7*  --   --   ALBUMIN 3.7 3.0*  --   --   AST 45* 34  --   --   ALT 37 31  --   --   ALKPHOS 96 81  --   --   BILITOT 0.7 0.6  --   --   GFRNONAA 36* 39* 44* 47*  ANIONGAP 12 8 10 7     Lipids No results for input(s): "CHOL", "TRIG", "HDL", "LABVLDL", "LDLCALC", "CHOLHDL" in the  last 168 hours.  Hematology Recent Labs  Lab 07/01/23 0410 07/02/23 0833 07/03/23 0548  WBC 14.9* 16.1* 11.0*  RBC 2.44* 2.72* 2.67*  HGB 8.0* 8.6* 8.5*  HCT 23.5* 26.5* 26.0*  MCV 96.3 97.4 97.4  MCH 32.8 31.6 31.8  MCHC 34.0 32.5 32.7  RDW 14.9 15.1 14.9  PLT 167 214 225   Thyroid No results for input(s): "TSH", "FREET4" in the last 168 hours.  BNPNo results for input(s): "BNP", "PROBNP" in the last 168 hours.  DDimer No results for input(s): "DDIMER" in the last 168 hours.   Radiology    DG Abd Portable 1V  Result Date: 07/01/2023 CLINICAL DATA:  Ileus EXAM: PORTABLE ABDOMEN - 1 VIEW COMPARISON:  CT abdomen and pelvis 06/30/2023 FINDINGS: The bowel gas pattern is normal. No radio-opaque calculi. There surgical clips in the right upper quadrant. Visualized lung bases are clear. IMPRESSION: Nonobstructive bowel gas pattern. Electronically Signed   By: Darliss Cheney M.D.   On: 07/01/2023 16:28   ECHOCARDIOGRAM COMPLETE  Result Date: 07/01/2023    ECHOCARDIOGRAM REPORT   Patient Name:   [name] Date of Exam: 07/01/2023 Medical Rec #:  [id number]         Height:        65.0 in Accession #:    [id number]        Weight:       192.9 lb Date of Birth:  [birth year]         BSA:          1.948 m Patient Age:    62 years          BP:           104/60 mmHg Patient Gender: F                 HR:           67 bpm. Exam Location:  Inpatient Procedure: 2D Echo, Cardiac Doppler and Color Doppler Indications:    Elevated Troponin  History:        Patient has prior history of Echocardiogram examinations, most                 recent 05/25/2021. CAD and NSTEMI, Prior CABG, COPD; Risk                 Factors:Sleep Apnea, Dyslipidemia and Hypertension.  Sonographer:    Milbert Coulter Referring Phys: [id number] KENNETH C HILTY IMPRESSIONS  1. Left ventricular ejection fraction, by estimation, is 50 to 55%. The left ventricle has low normal function. The left ventricle demonstrates regional wall motion abnormalities (see scoring diagram/findings for description). Left ventricular diastolic  parameters were normal.  2. Right ventricular systolic function is normal. The right ventricular size is normal. Tricuspid regurgitation signal is inadequate for assessing PA pressure.  3. Left atrial size was moderately dilated.  4. The mitral valve is normal in structure. No evidence of mitral valve regurgitation. No evidence of mitral stenosis.  5. The aortic valve is tricuspid. Aortic valve regurgitation is not visualized. Aortic valve sclerosis is present, with no evidence of aortic valve stenosis.  6. The inferior vena cava is normal in size with greater than 50% respiratory variability, suggesting right atrial pressure of 3 mmHg. Comparison(s): Prior images reviewed side by side. Direct comparison shows a similar wall motion abnormality on the 2021 study. FINDINGS  Left Ventricle: Left ventricular ejection fraction, by estimation, is 50 to 55%. The left ventricle has low normal  function. The left ventricle demonstrates regional wall motion abnormalities. The left ventricular internal cavity size was normal in size.  There is no left ventricular hypertrophy. Abnormal (paradoxical) septal motion consistent with post-operative status. Left ventricular diastolic parameters were normal. Normal left ventricular filling pressure.  LV Wall Scoring: The basal inferolateral segment and basal inferior segment are hypokinetic. The entire anterior wall, antero-lateral wall, mid and distal lateral wall, entire septum, entire apex, and mid and distal inferior wall are normal. Right Ventricle: The right ventricular size is normal. No increase in right ventricular wall thickness. Right ventricular systolic function is normal. Tricuspid regurgitation signal is inadequate for assessing PA pressure. Left Atrium: Left atrial size was moderately dilated. Right Atrium: Right atrial size was normal in size. Pericardium: There is no evidence of pericardial effusion. Mitral Valve: The mitral valve is normal in structure. No evidence of mitral valve regurgitation. No evidence of mitral valve stenosis. Tricuspid Valve: The tricuspid valve is normal in structure. Tricuspid valve regurgitation is not demonstrated. Aortic Valve: The aortic valve is tricuspid. Aortic valve regurgitation is not visualized. Aortic valve sclerosis is present, with no evidence of aortic valve stenosis. Aortic valve mean gradient measures 7.0 mmHg. Aortic valve peak gradient measures 14.6 mmHg. Aortic valve area, by VTI measures 2.30 cm. Pulmonic Valve: The pulmonic valve was normal in structure. Pulmonic valve regurgitation is trivial. No evidence of pulmonic stenosis. Aorta: The aortic root and ascending aorta are structurally normal, with no evidence of dilitation. Venous: The inferior vena cava is normal in size with greater than 50% respiratory variability, suggesting right atrial pressure of 3 mmHg. IAS/Shunts: No atrial level shunt detected by color flow Doppler.  LEFT VENTRICLE PLAX 2D LVIDd:         4.80 cm   Diastology LVIDs:         3.80 cm   LV e' medial:    7.40 cm/s  LV PW:         1.10 cm   LV E/e' medial:  13.0 LV IVS:        1.20 cm   LV e' lateral:   11.00 cm/s LVOT diam:     2.00 cm   LV E/e' lateral: 8.7 LV SV:         83 LV SV Index:   42 LVOT Area:     3.14 cm  RIGHT VENTRICLE RV Basal diam:  3.00 cm RV Mid diam:    2.60 cm RV S prime:     13.70 cm/s TAPSE (M-mode): 1.9 cm LEFT ATRIUM             Index        RIGHT ATRIUM           Index LA diam:        4.60 cm 2.36 cm/m   RA Area:     16.40 cm LA Vol (A2C):   75.8 ml 38.91 ml/m  RA Volume:   38.20 ml  19.61 ml/m LA Vol (A4C):   57.8 ml 29.67 ml/m LA Biplane Vol: 67.0 ml 34.39 ml/m  AORTIC VALVE AV Area (Vmax):    2.06 cm AV Area (Vmean):   2.06 cm AV Area (VTI):     2.30 cm AV Vmax:           191.00 cm/s AV Vmean:          124.000 cm/s AV VTI:            0.360 m AV Peak Grad:  14.6 mmHg AV Mean Grad:      7.0 mmHg LVOT Vmax:         125.00 cm/s LVOT Vmean:        81.200 cm/s LVOT VTI:          0.263 m LVOT/AV VTI ratio: 0.73  AORTA Ao Root diam: 3.30 cm Ao Asc diam:  3.30 cm MITRAL VALVE MV Area (PHT): 2.10 cm    SHUNTS MV Decel Time: 362 msec    Systemic VTI:  0.26 m MV E velocity: 95.90 cm/s  Systemic Diam: 2.00 cm MV A velocity: 84.90 cm/s MV E/A ratio:  1.13 Mihai Croitoru MD Electronically signed by Thurmon Fair MD Signature Date/Time: 07/01/2023/3:52:14 PM    Final     Cardiac Studies   Echo 07/01/23:  1. Left ventricular ejection fraction, by estimation, is 50 to 55%. The  left ventricle has low normal function. The left ventricle demonstrates  regional wall motion abnormalities (see scoring diagram/findings for  description). Left ventricular diastolic   parameters were normal.   2. Right ventricular systolic function is normal. The right ventricular  size is normal. Tricuspid regurgitation signal is inadequate for assessing  PA pressure.   3. Left atrial size was moderately dilated.   4. The mitral valve is normal in structure. No evidence of mitral valve  regurgitation. No  evidence of mitral stenosis.   5. The aortic valve is tricuspid. Aortic valve regurgitation is not  visualized. Aortic valve sclerosis is present, with no evidence of aortic  valve stenosis.   6. The inferior vena cava is normal in size with greater than 50%  respiratory variability, suggesting right atrial pressure of 3 mmHg.   Patient Profile     62 y.o. female  with a history of CAD s/p prior PCI with subsequent CABG x 3 with LIMA-LAD, SVG-ramus, SVG-PDA in 2008, hypertension, hyperlipidemia, and OSA being seen for chest pain in the setting of recent lap chole complicated by bleeding and anemia.   Assessment & Plan    Chest pain - Troponin 396-->406 - EKG with T wave inversion and minimal ST depression inferior and lateral leads - Echocardiogram with preserved LVEF but wall motion abnormality: Paradoxical septal motion consistent with postop status, basal inferolateral segment and basal inferior segment hypokinetic; the entire anterior wall, anterolateral wall, mid to distal lateral wall, entire septum, entire apex, and mid and distal inferior wall are normal -Last heart catheterization 02/2020 showed patent LIMA-LAD, SVG-ramus, but distally occluded SVG-PDA - Given known disease, suspect troponin elevation likely demand ischemia, however patient continues to have exertional chest pain with walking and new WMA along with EKG changes -Stress testing deferred given recent lap chole with bleed complication; I am not certain this is needed if she does not have targets for revascularization - Continue medical management of her known disease -She does not think the increased Imdur to 120 mg has helped her stable angina - We discussed trialing 500 mg Ranexa twice daily and she is in agreement   CAD  Prior PCI CABG x 3 in 2008 -Maintained on aspirin and Plavix, low-dose beta-blocker, Imdur, and amlodipine   Hypertension Controlled on 5 mg amlodipine, 120 mg Imdur, 12.5 mg Lopressor twice  daily -BP elevated this morning prior to medications   Recent laparoscopic cholecystectomy Complicated by anemia secondary to postoperative bleed - Hemoglobin as low as 8.0, has improved to 8.5 today -Anemia likely contributing to angina -Keep hemoglobin above 8 given extensive CAD -Per surgery  Cardiology follow-up has been arranged.     For questions or updates, please contact [facility] Please consult [email or web address] for contact info under        Signed, Marcelino Duster, PA  07/03/2023, 9:00 AM

## 2023-07-03 NOTE — Progress Notes (Signed)
1610- Pt complaining of 9.5/10 chest pain while ambulating. Patient states it feels different this time and is a pressure in her chest radiating down her L arm and a pressure in her head. RN did an EKG and spoke with cardiology who will follow up in the morning. Chest pain subsided when patient got back in bed.

## 2023-07-03 NOTE — Telephone Encounter (Signed)
*  STAT* If patient is at the pharmacy, call can be transferred to refill team.   1. Which medications need to be refilled? (please list name of each medication and dose if known) Alirocumab (PRALUENT) 150 MG/ML SOAJ ; nitroGLYCERIN (NITROSTAT) 0.4 MG SL tablet ; pantoprazole (PROTONIX) 40 MG tablet    2. Would you like to learn more about the convenience, safety, & potential cost savings by using the [facility]? No     3. Are you open to using the [facility] (Type [facility]. No ).   4. Which pharmacy/location (including street and city if local pharmacy) is medication to be sent to? [id number] - , [place] - [address]    5. Do they need a 30 day or 90 day supply? 90

## 2023-07-04 MED ORDER — PANTOPRAZOLE SODIUM 40 MG PO TBEC: 40.0000 mg | DELAYED_RELEASE_TABLET | Freq: Every day | ORAL | 3 refills | Status: DC

## 2023-07-04 MED ORDER — NITROGLYCERIN 0.4 MG SL SUBL: 0.4000 mg | SUBLINGUAL_TABLET | SUBLINGUAL | 3 refills | Status: DC | PRN

## 2023-07-04 MED ORDER — PRALUENT 150 MG/ML ~~LOC~~ SOAJ: 150.0000 mg | SUBCUTANEOUS | 1 refills | Status: DC

## 2023-07-04 NOTE — Telephone Encounter (Signed)
Already filled

## 2023-07-05 ENCOUNTER — Telehealth: Payer: Self-pay | Admitting: Cardiovascular Disease

## 2023-07-05 NOTE — Telephone Encounter (Signed)
*  STAT* If patient is at the pharmacy, call can be transferred to refill team.   1. Which medications need to be refilled? (please list name of each medication and dose if known) Alirocumab (PRALUENT) 150 MG/ML SOAJ ;    2. Would you like to learn more about the convenience, safety, & potential cost savings by using the [facility]? No      3. Are you open to using the [facility] (Type [facility]. No ).   4. Which pharmacy/location (including street and city if local pharmacy) is medication to be sent to? [id number] - , [place] - [address]    5. Do they need a 30 day or 90 day supply? 90

## 2023-07-06 ENCOUNTER — Other Ambulatory Visit: Payer: Self-pay

## 2023-07-06 ENCOUNTER — Emergency Department (HOSPITAL_BASED_OUTPATIENT_CLINIC_OR_DEPARTMENT_OTHER): Payer: Medicare Other

## 2023-07-06 ENCOUNTER — Emergency Department (HOSPITAL_COMMUNITY)
Admission: EM | Admit: 2023-07-06 | Discharge: 2023-07-06 | Discharge status: Against Medical Advice | Payer: Medicare Other | Attending: Emergency Medicine | Admitting: Emergency Medicine

## 2023-07-06 ENCOUNTER — Encounter (HOSPITAL_COMMUNITY): Payer: Self-pay | Admitting: Emergency Medicine

## 2023-07-06 ENCOUNTER — Emergency Department (HOSPITAL_BASED_OUTPATIENT_CLINIC_OR_DEPARTMENT_OTHER)
Admission: EM | Admit: 2023-07-06 | Discharge: 2023-07-07 | Disposition: A | Discharge status: Home / Self Care | Payer: Medicare Other

## 2023-07-06 DIAGNOSIS — Z9049 Acquired absence of other specified parts of digestive tract: Secondary | ICD-10-CM | POA: Insufficient documentation

## 2023-07-06 DIAGNOSIS — Z951 Presence of aortocoronary bypass graft: Secondary | ICD-10-CM | POA: Insufficient documentation

## 2023-07-06 DIAGNOSIS — R112 Nausea with vomiting, unspecified: Secondary | ICD-10-CM | POA: Insufficient documentation

## 2023-07-06 DIAGNOSIS — I110 Hypertensive heart disease with heart failure: Secondary | ICD-10-CM | POA: Insufficient documentation

## 2023-07-06 DIAGNOSIS — R1013 Epigastric pain: Secondary | ICD-10-CM | POA: Insufficient documentation

## 2023-07-06 DIAGNOSIS — I509 Heart failure, unspecified: Secondary | ICD-10-CM | POA: Insufficient documentation

## 2023-07-06 DIAGNOSIS — Z79899 Other long term (current) drug therapy: Secondary | ICD-10-CM | POA: Insufficient documentation

## 2023-07-06 DIAGNOSIS — Z7982 Long term (current) use of aspirin: Secondary | ICD-10-CM | POA: Insufficient documentation

## 2023-07-06 DIAGNOSIS — R197 Diarrhea, unspecified: Secondary | ICD-10-CM | POA: Insufficient documentation

## 2023-07-06 DIAGNOSIS — J449 Chronic obstructive pulmonary disease, unspecified: Secondary | ICD-10-CM | POA: Insufficient documentation

## 2023-07-06 DIAGNOSIS — Z5321 Procedure and treatment not carried out due to patient leaving prior to being seen by health care provider: Secondary | ICD-10-CM | POA: Insufficient documentation

## 2023-07-06 DIAGNOSIS — R1084 Generalized abdominal pain: Secondary | ICD-10-CM | POA: Insufficient documentation

## 2023-07-06 LAB — URINALYSIS, W/ REFLEX TO CULTURE (INFECTION SUSPECTED)
Bacteria, UA: NONE SEEN
Bilirubin Urine: NEGATIVE
Glucose, UA: 250 mg/dL — AB
Leukocytes,Ua: NEGATIVE
Nitrite: NEGATIVE
Protein, ur: 30 mg/dL — AB
Specific Gravity, Urine: 1.032 — ABNORMAL HIGH (ref 1.005–1.030)
pH: 7.5 (ref 5.0–8.0)

## 2023-07-06 LAB — CBC WITH DIFFERENTIAL/PLATELET
Abs Immature Granulocytes: 0.44 10*3/uL — ABNORMAL HIGH (ref 0.00–0.07)
Basophils Absolute: 0.1 10*3/uL (ref 0.0–0.1)
Basophils Relative: 0 %
Eosinophils Absolute: 0.1 10*3/uL (ref 0.0–0.5)
Eosinophils Relative: 0 %
HCT: 30.5 % — ABNORMAL LOW (ref 36.0–46.0)
Hemoglobin: 10.3 g/dL — ABNORMAL LOW (ref 12.0–15.0)
Immature Granulocytes: 2 %
Lymphocytes Relative: 11 %
Lymphs Abs: 2.3 10*3/uL (ref 0.7–4.0)
MCH: 32.1 pg (ref 26.0–34.0)
MCHC: 33.8 g/dL (ref 30.0–36.0)
MCV: 95 fL (ref 80.0–100.0)
Monocytes Absolute: 1.1 10*3/uL — ABNORMAL HIGH (ref 0.1–1.0)
Monocytes Relative: 6 %
Neutro Abs: 16.4 10*3/uL — ABNORMAL HIGH (ref 1.7–7.7)
Neutrophils Relative %: 81 %
Platelets: 409 10*3/uL — ABNORMAL HIGH (ref 150–400)
RBC: 3.21 MIL/uL — ABNORMAL LOW (ref 3.87–5.11)
RDW: 16.5 % — ABNORMAL HIGH (ref 11.5–15.5)
WBC: 20.4 10*3/uL — ABNORMAL HIGH (ref 4.0–10.5)
nRBC: 0.4 % — ABNORMAL HIGH (ref 0.0–0.2)

## 2023-07-06 LAB — COMPREHENSIVE METABOLIC PANEL
ALT: 20 U/L (ref 0–44)
AST: 30 U/L (ref 15–41)
Albumin: 4.3 g/dL (ref 3.5–5.0)
Alkaline Phosphatase: 113 U/L (ref 38–126)
Anion gap: 14 (ref 5–15)
BUN: 18 mg/dL (ref 8–23)
CO2: 23 mmol/L (ref 22–32)
Calcium: 10.2 mg/dL (ref 8.9–10.3)
Chloride: 99 mmol/L (ref 98–111)
Creatinine, Ser: 1.47 mg/dL — ABNORMAL HIGH (ref 0.44–1.00)
GFR, Estimated: 40 mL/min — ABNORMAL LOW (ref >60)
Glucose, Bld: 203 mg/dL — ABNORMAL HIGH (ref 70–99)
Potassium: 3.2 mmol/L — ABNORMAL LOW (ref 3.5–5.1)
Sodium: 136 mmol/L (ref 135–145)
Total Bilirubin: 2.2 mg/dL — ABNORMAL HIGH (ref <1.2)
Total Protein: 7.8 g/dL (ref 6.5–8.1)

## 2023-07-06 LAB — TROPONIN I (HIGH SENSITIVITY): Troponin I (High Sensitivity): 50 ng/L — ABNORMAL HIGH (ref <18)

## 2023-07-06 LAB — LIPASE, BLOOD: Lipase: 30 U/L (ref 11–51)

## 2023-07-06 LAB — LACTIC ACID, PLASMA: Lactic Acid, Venous: 1.7 mmol/L (ref 0.5–1.9)

## 2023-07-06 MED ORDER — HYDROMORPHONE HCL 1 MG/ML IJ SOLN
0.5000 mg | Freq: Once | INTRAMUSCULAR | Status: AC
Start: 2023-07-06 — End: 2023-07-06
  Administered 2023-07-06: 0.5 mg via INTRAVENOUS
  Filled 2023-07-06: qty 1

## 2023-07-06 MED ORDER — ONDANSETRON HCL 4 MG/2ML IJ SOLN
4.0000 mg | Freq: Once | INTRAMUSCULAR | Status: AC
  Administered 2023-07-06: 4 mg via INTRAVENOUS
  Filled 2023-07-06: qty 2

## 2023-07-06 MED ORDER — LACTATED RINGERS IV BOLUS
1000.0000 mL | Freq: Once | INTRAVENOUS | Status: AC
  Administered 2023-07-06: 1000 mL via INTRAVENOUS

## 2023-07-06 MED ORDER — IOHEXOL 350 MG/ML SOLN
100.0000 mL | Freq: Once | INTRAVENOUS | Status: AC | PRN
  Administered 2023-07-06: 100 mL via INTRAVENOUS

## 2023-07-06 NOTE — ED Triage Notes (Signed)
Post cholecystectomy 11/8. Returned to hospitalized again last Friday for worsening at home- pain, vomiting blood, and opening of wound. Discharge Tuesday. Now in pain, chills, vomiting again.

## 2023-07-06 NOTE — ED Provider Notes (Signed)
[place] [facility]ERGENCY DEPARTMENT AT [facility] Provider Note   CSN: [id number] Arrival date & time: 07/06/23  1910     History  Chief Complaint  Patient presents with   Abdominal Pain    Post cholecystectomy    [name] is a 62 y.o. female with past medical history significant for bipolar 1 disorder, COPD, hyperlipidemia, schizophrenia, GERD, hypertension, MI, CABG, CHF, s/p cholecystectomy 06/29/23 presents to the ED complaining of severe abdominal pain, nausea, and vomiting that began last night.  Patient was recently in the hospital for laparoscopic cholecystectomy.  She reports feeling well upon hospital discharge until her symptoms only returned last night.  She also reports a temperature of 100 F today and alternates between chills and sweats.  Patient did have dehiscence of the umbilical surgical incision site at her last hospital stay, but reports no issues with her incisions today.  Patient also reports that she began having chest pain while in the ED.  Denies chest tightness, shortness of breath, diarrhea, dizziness, lightheadedness, syncope.       Home Medications Prior to Admission medications   Medication Sig Start Date End Date Taking? Authorizing Provider  albuterol (PROVENTIL HFA;VENTOLIN HFA) 108 (90 BASE) MCG/ACT inhaler Inhale 2 puffs into the lungs every 6 (six) hours as needed for wheezing or shortness of breath.     [provider]  Alirocumab (PRALUENT) 150 MG/ML SOAJ Inject 1 mL (150 mg total) into the skin every 14 (fourteen) days. 07/04/23   Runell Gess, MD  amLODipine (NORVASC) 5 MG tablet Take 1 tablet (5 mg total) by mouth daily. 06/19/22   Runell Gess, MD  Ascorbic Acid (VITAMIN C PO) Take 1 tablet by mouth daily.    [provider]  aspirin 81 MG chewable tablet Chew 81 mg by mouth in the morning and at bedtime.    [provider]  atorvastatin (LIPITOR) 80 MG tablet Take 1 tablet (80 mg total) by  mouth daily. 08/11/22   Runell Gess, MD  brimonidine (ALPHAGAN) 0.15 % ophthalmic solution Place 1 drop into both eyes 2 (two) times daily.     [provider]  Budeson-Glycopyrrol-Formoterol (BREZTRI AEROSPHERE) 160-9-4.8 MCG/ACT AERO Inhale 2 puffs into the lungs in the morning and at bedtime. 04/30/23   Cobb, Ruby Cola, NP  escitalopram (LEXAPRO) 20 MG tablet Take 20 mg by mouth daily. 05/31/19   [provider]  fluticasone (FLONASE) 50 MCG/ACT nasal spray Place 2 sprays into both nostrils daily. 05/30/23   Ashok Croon, MD  icosapent Ethyl (VASCEPA) 1 g capsule Take 2 capsules (2 g total) by mouth 2 (two) times daily. Patient taking differently: Take 1 g by mouth 2 (two) times daily. 08/11/22   Runell Gess, MD  isosorbide mononitrate (IMDUR) 30 MG 24 hr tablet TAKE 3 TABLETS BY MOUTH DAILY. 02/21/23   Runell Gess, MD  lamoTRIgine (LAMICTAL) 100 MG tablet Take 100 mg by mouth daily. 08/05/19   [provider]  linaclotide (LINZESS) 145 MCG CAPS capsule Take 1 capsule (145 mcg total) by mouth daily before breakfast. Patient taking differently: Take 145 mcg by mouth daily as needed (constipation). 12/25/22 12/20/23  Zehr, Shanda Bumps D, PA-C  metoprolol tartrate (LOPRESSOR) 25 MG tablet TAKE 1/2 TABLET BY MOUTH TWICE A DAY 10/20/22   Runell Gess, MD  niacin (NIASPAN) 1000 MG CR tablet TAKE 1 TABLET (1,000 MG TOTAL) BY MOUTH AT BEDTIME. 02/21/23   Runell Gess, MD  nitroGLYCERIN (NITROSTAT)  0.4 MG SL tablet Place 1 tablet (0.4 mg total) under the tongue every 5 (five) minutes as needed for chest pain. 07/04/23   Runell Gess, MD  pantoprazole (PROTONIX) 40 MG tablet Take 1 tablet (40 mg total) by mouth daily. 07/04/23   Runell Gess, MD  QUEtiapine Fumarate 150 MG TABS Take 1 tablet by mouth at bedtime.    [provider]  ranolazine (RANEXA) 500 MG 12 hr tablet TAKE 1 TABLET BY MOUTH TWICE A DAY 09/15/22   Runell Gess, MD   Vitamin D, Ergocalciferol, (DRISDOL) 1.25 MG (50000 UNIT) CAPS capsule Take 50,000 Units by mouth every 7 (seven) days. 04/30/23 04/29/24  [provider]  VITAMIN E PO Take 1 tablet by mouth daily.    [provider]      Allergies    Benadryl [diphenhydramine hcl], Codeine, Haldol [haloperidol decanoate], Amoxicillin, and Bee venom    Review of Systems   Review of Systems  Constitutional:  Positive for chills. Negative for fever.  Respiratory:  Negative for chest tightness and shortness of breath.   Cardiovascular:  Positive for chest pain.  Gastrointestinal:  Positive for abdominal pain, nausea and vomiting. Negative for diarrhea.  Neurological:  Negative for dizziness, syncope and light-headedness.    Physical Exam Updated Vital Signs BP 122/74   Pulse (!) 58   Temp 98.7 F (37.1 C) (Oral)   Resp 17   Wt 84.8 kg   LMP 11/16/2011   SpO2 95%   BMI 31.12 kg/m  Physical Exam Vitals and nursing note reviewed.  Constitutional:      General: She is not in acute distress.    Appearance: Normal appearance. She is obese. She is ill-appearing. She is not diaphoretic.     Comments: Patient appears uncomfortable.  Cardiovascular:     Rate and Rhythm: Normal rate and regular rhythm.     Heart sounds: Normal heart sounds.  Pulmonary:     Effort: Pulmonary effort is normal. No tachypnea, accessory muscle usage or respiratory distress.     Breath sounds: Normal breath sounds and air entry.  Abdominal:     General: Abdomen is flat.     Palpations: Abdomen is soft.     Tenderness: There is generalized abdominal tenderness.       Comments: Abdominal tenderness R > L  Skin:    General: Skin is warm and dry.     Capillary Refill: Capillary refill takes less than 2 seconds.  Neurological:     Mental Status: She is alert. Mental status is at baseline.  Psychiatric:        Mood and Affect: Mood normal.        Behavior: Behavior normal.     ED Results / Procedures  / Treatments   Labs (all labs ordered are listed, but only abnormal results are displayed) Labs Reviewed  COMPREHENSIVE METABOLIC PANEL - Abnormal; Notable for the following components:      Result Value   Potassium 3.2 (*)    Glucose, Bld 203 (*)    Creatinine, Ser 1.47 (*)    Total Bilirubin 2.2 (*)    GFR, Estimated 40 (*)    All other components within normal limits  CBC WITH DIFFERENTIAL/PLATELET - Abnormal; Notable for the following components:   WBC 20.4 (*)    RBC 3.21 (*)    Hemoglobin 10.3 (*)    HCT 30.5 (*)    RDW 16.5 (*)    Platelets 409 (*)  nRBC 0.4 (*)    Neutro Abs 16.4 (*)    Monocytes Absolute 1.1 (*)    Abs Immature Granulocytes 0.44 (*)    All other components within normal limits  URINALYSIS, W/ REFLEX TO CULTURE (INFECTION SUSPECTED) - Abnormal; Notable for the following components:   Specific Gravity, Urine 1.032 (*)    Glucose, UA 250 (*)    Hgb urine dipstick LARGE (*)    Ketones, ur TRACE (*)    Protein, ur 30 (*)    All other components within normal limits  TROPONIN I (HIGH SENSITIVITY) - Abnormal; Notable for the following components:   Troponin I (High Sensitivity) 50 (*)    All other components within normal limits  TROPONIN I (HIGH SENSITIVITY) - Abnormal; Notable for the following components:   Troponin I (High Sensitivity) 45 (*)    All other components within normal limits  LACTIC ACID, PLASMA  LIPASE, BLOOD    EKG EKG Interpretation Date/Time:  Friday July 06 2023 19:55:20 EST Ventricular Rate:  61 PR Interval:  168 QRS Duration:  114 QT Interval:  486 QTC Calculation: 489 R Axis:   4  Text Interpretation: Normal sinus rhythm Moderate voltage criteria for LVH, may be normal variant ( R in aVL , Cornell product ) Inferior-posterior infarct (cited on or before 01-Jul-2023) Cannot rule out Anterior infarct , age undetermined Abnormal ECG When compared with ECG of 03-Jul-2023 05:06, Minimal criteria for Anterior infarct are  now Present Questionable change in initial forces of Inferior leads T wave inversion no longer evident in Inferior leads QT has lengthened Confirmed by Estanislado Pandy [phone]) on 07/06/2023 7:57:18 PM  Radiology CT ABDOMEN PELVIS W CONTRAST  Result Date: 07/06/2023 CLINICAL DATA:  Epigastric pain radiating to back. Nausea, vomiting. Cholecystectomy 1 week ago. EXAM: CT ABDOMEN AND PELVIS WITH CONTRAST TECHNIQUE: Multidetector CT imaging of the abdomen and pelvis was performed using the standard protocol following bolus administration of intravenous contrast. RADIATION DOSE REDUCTION: This exam was performed according to the departmental dose-optimization program which includes automated exposure control, adjustment of the mA and/or kV according to patient size and/or use of iterative reconstruction technique. CONTRAST:  OMNIPAQUE IOHEXOL 350 MG/ML SOLN COMPARISON:  06/30/2023 FINDINGS: Lower chest: No acute abnormality. Hepatobiliary: Changes of cholecystectomy. Fluid collection in the gallbladder fossa containing gas measures approximately 3.7 x 2.9 cm. In addition, there is high-density fluid adjacent to the right lobe of the liver and in the pelvis concerning for blood/hematoma. This is stable since prior study. Pancreas: No focal abnormality or ductal dilatation. Spleen: No focal abnormality.  Normal size. Adrenals/Urinary Tract: Small bilateral renal cysts, stable. No follow-up imaging recommended. No stones or hydronephrosis. Adrenal glands and urinary bladder unremarkable. Stomach/Bowel: Stomach, large and small bowel grossly unremarkable. Vascular/Lymphatic: Scattered aortic calcifications. No evidence of aneurysm or adenopathy. Reproductive: Uterus and adnexa unremarkable.  No mass. Other: High-density free fluid in the pelvis, likely blood/hemoperitoneum, slightly decreased since prior study. Musculoskeletal: No acute bony abnormality. IMPRESSION: Fluid collection in the gallbladder fossa is not  significantly changed since prior study and continues to contain gas. This could reflect postoperative fluid collection such as seroma or abscess. Continued blood/hematoma surrounding the right hepatic lobe and extending in the right paracolic gutter, stable since prior study. Blood layering in the cul-de-sac of the pelvis slightly decreased since prior study. Aortic atherosclerosis. Electronically Signed   By: Charlett Nose M.D.   On: 07/06/2023 21:51    Procedures Procedures    Medications Ordered in  ED Medications  HYDROmorphone (DILAUDID) injection 0.5 mg (0.5 mg Intravenous Given 07/06/23 2048)  ondansetron (ZOFRAN) injection 4 mg (4 mg Intravenous Given 07/06/23 2048)  lactated ringers bolus 1,000 mL (0 mLs Intravenous Stopped 07/06/23 2217)  iohexol (OMNIPAQUE) 350 MG/ML injection 100 mL (100 mLs Intravenous Contrast Given 07/06/23 2056)  HYDROmorphone (DILAUDID) injection 0.5 mg (0.5 mg Intravenous Given 07/06/23 2129)    ED Course/ Medical Decision Making/ A&P                                 Medical Decision Making Amount and/or Complexity of Data Reviewed Labs: ordered. Radiology: ordered.  Risk Prescription drug management.   This patient presents to the ED with chief complaint(s) of abdominal pain, nausea, vomiting with pertinent past medical history of s/p cholecystectomy, MI, CABG, HTN.  The complaint involves an extensive differential diagnosis and also carries with it a high risk of complications and morbidity.    The differential diagnosis includes post op complication, intra-abdominal infection, expanding liver hematoma   The initial plan is to obtain labs, CT  Additional history obtained: Records reviewed previous admission documents  Initial Assessment:   Exam significant for ill-appearing patient who is not in acute distress.  She does appear uncomfortable.  Abdomen is soft with generalized tenderness.  Tenderness is worse on the right side.  There are 2  large areas of ecchymosis surrounding surgical incision sites.  No increased warmth, erythema, purulent drainage, or evidence of wound dehiscence.  Lungs are clear to auscultation bilaterally.  Heart rate is normal in the 60s with regular rhythm.  Vitals are stable.  Patient is afebrile.   Independent ECG/labs interpretation:  The following labs were independently interpreted:  CBC with leukocytosis, WBC 20.4.  This has increased from previous values, patient was downtrending prior to discharge from hospital.  Hemoglobin is stable at 10.3.  UA with large amount of microscopic hemoglobin, but no evidence of infection.  Metabolic panel with mild hypokalemia.  Creatinine is elevated above patient's baseline.  Anion gap normal.  Initial troponin 50, much improved from few days ago.  Lipase unremarkable.  Repeat troponin down trending.  Independent visualization and interpretation of imaging: I independently visualized the following imaging with scope of interpretation limited to determining acute life threatening conditions related to emergency care: CT abdomen/pelvis, which revealed stable post-operative changes.  No evidence of expanding hematoma or intra-abdominal infection.    Treatment and Reassessment: Patient given 2 IV doses of Dilaudid and Zofran.  Patient also given 1L IV fluids.  Upon reassessment, patient reports feeling significantly better.  She reports she has no pain or nausea.  Patient given water for PO challenge.    Elevated WBCs may be reactive or inflammatory as there is no evidence of acute infection at this time.  Patient passed PO challenge and has been drinking fluids without return of nausea or pain.  Patient appears to be feeling much better.   Consultations obtained:   I requested consultation with on-call general surgery provider and spoke with Dr. Lysle Rubens.  Dr. Azucena Cecil reviewed patient's labs and imaging.  Patient's imaging is stable from prior and there is no  significant change to explain patient's white count.  Patient does not need intervention from a surgical standpoint.  Dr. Azucena Cecil feels that if patient is able to tolerate PO, pain and nausea are well-controlled, that she would be appropriate for an outpatient follow-up with medications.  However, if patient continues to have severe pain, patient would likely benefit from hospital admission.    Disposition:   Patient reports she has follow up with cardiology and PCP next week.  Advised patient to have her blood work rechecked next week.  Plan to send patient home with Zofran and pain medicine to manage symptoms.  Stressed to patient importance of staying well hydrated.    The patient has been appropriately medically screened and/or stabilized in the ED. I have low suspicion for any other emergent medical condition which would require further screening, evaluation or treatment in the ED or require inpatient management. At time of discharge the patient is hemodynamically stable and in no acute distress. I have discussed work-up results and diagnosis with patient and answered all questions. Patient is agreeable with discharge plan. We discussed strict return precautions for returning to the emergency department and they verbalized understanding.    Social Determinants of Health:   Patient's  tobacco dependence   increases the complexity of managing their presentation         Final Clinical Impression(s) / ED Diagnoses Final diagnoses:  Generalized abdominal pain  Nausea and vomiting, unspecified vomiting type  S/P laparoscopic cholecystectomy    Rx / DC Orders ED Discharge Orders     None         Lenard Simmer, PA-C 07/07/23 0011    Coral Spikes, DO 07/07/23 0016

## 2023-07-06 NOTE — ED Triage Notes (Signed)
Per [facility] pt coming from home c/o epigastric pain radiating into back onset of last night along with N/V. Gall bladder removed a week ago. Reports diarrhea since then. Given 4mg  zofran en route.

## 2023-07-06 NOTE — ED Provider Notes (Incomplete)
[place] EMERGENCY DEPARTMENT AT [facility] Provider Note   CSN: [id number] Arrival date & time: 07/06/23  1910     History {Add pertinent medical, surgical, social history, OB history to HPI:1} Chief Complaint  Patient presents with  . Abdominal Pain    Post cholecystectomy    [name] is a 62 y.o. female with past medical history significant for bipolar 1 disorder, COPD, hyperlipidemia, schizophrenia, GERD, hypertension, MI, CABG, CHF, s/p cholecystectomy 06/29/23 presents to the ED complaining of severe abdominal pain, nausea, and vomiting that began last night.  Patient was recently in the hospital for laparoscopic cholecystectomy.  She reports feeling well upon hospital discharge until her symptoms only returned last night.  She also reports a temperature of 100 F today and alternates between chills and sweats.  Patient did have dehiscence of the umbilical surgical incision site at her last hospital stay, but reports no issues with her incisions today.  Patient also reports that she began having chest pain while in the ED.  Denies chest tightness, shortness of breath, diarrhea, dizziness, lightheadedness, syncope.       Home Medications Prior to Admission medications   Medication Sig Start Date End Date Taking? Authorizing Provider  albuterol (PROVENTIL HFA;VENTOLIN HFA) 108 (90 BASE) MCG/ACT inhaler Inhale 2 puffs into the lungs every 6 (six) hours as needed for wheezing or shortness of breath.     [provider]  Alirocumab (PRALUENT) 150 MG/ML SOAJ Inject 1 mL (150 mg total) into the skin every 14 (fourteen) days. 07/04/23   Runell Gess, MD  amLODipine (NORVASC) 5 MG tablet Take 1 tablet (5 mg total) by mouth daily. 06/19/22   Runell Gess, MD  Ascorbic Acid (VITAMIN C PO) Take 1 tablet by mouth daily.    [provider]  aspirin 81 MG chewable tablet Chew 81 mg by mouth in the morning and at bedtime.    [provider]   atorvastatin (LIPITOR) 80 MG tablet Take 1 tablet (80 mg total) by mouth daily. 08/11/22   Runell Gess, MD  brimonidine (ALPHAGAN) 0.15 % ophthalmic solution Place 1 drop into both eyes 2 (two) times daily.     [provider]  Budeson-Glycopyrrol-Formoterol (BREZTRI AEROSPHERE) 160-9-4.8 MCG/ACT AERO Inhale 2 puffs into the lungs in the morning and at bedtime. 04/30/23   Cobb, Ruby Cola, NP  escitalopram (LEXAPRO) 20 MG tablet Take 20 mg by mouth daily. 05/31/19   [provider]  fluticasone (FLONASE) 50 MCG/ACT nasal spray Place 2 sprays into both nostrils daily. 05/30/23   Ashok Croon, MD  icosapent Ethyl (VASCEPA) 1 g capsule Take 2 capsules (2 g total) by mouth 2 (two) times daily. Patient taking differently: Take 1 g by mouth 2 (two) times daily. 08/11/22   Runell Gess, MD  isosorbide mononitrate (IMDUR) 30 MG 24 hr tablet TAKE 3 TABLETS BY MOUTH DAILY. 02/21/23   Runell Gess, MD  lamoTRIgine (LAMICTAL) 100 MG tablet Take 100 mg by mouth daily. 08/05/19   [provider]  linaclotide (LINZESS) 145 MCG CAPS capsule Take 1 capsule (145 mcg total) by mouth daily before breakfast. Patient taking differently: Take 145 mcg by mouth daily as needed (constipation). 12/25/22 12/20/23  Zehr, Shanda Bumps D, PA-C  metoprolol tartrate (LOPRESSOR) 25 MG tablet TAKE 1/2 TABLET BY MOUTH TWICE A DAY 10/20/22   Runell Gess, MD  niacin (NIASPAN) 1000 MG CR tablet TAKE 1 TABLET (1,000 MG TOTAL) BY MOUTH AT BEDTIME. 02/21/23  Runell Gess, MD  nitroGLYCERIN (NITROSTAT) 0.4 MG SL tablet Place 1 tablet (0.4 mg total) under the tongue every 5 (five) minutes as needed for chest pain. 07/04/23   Runell Gess, MD  pantoprazole (PROTONIX) 40 MG tablet Take 1 tablet (40 mg total) by mouth daily. 07/04/23   Runell Gess, MD  QUEtiapine Fumarate 150 MG TABS Take 1 tablet by mouth at bedtime.    [provider]  ranolazine (RANEXA) 500 MG 12 hr tablet TAKE 1  TABLET BY MOUTH TWICE A DAY 09/15/22   Runell Gess, MD  traMADol (ULTRAM) 50 MG tablet Take 1 tablet (50 mg total) by mouth every 6 (six) hours as needed for up to 7 days. 06/29/23 07/06/23  Gaynelle Adu, MD  Vitamin D, Ergocalciferol, (DRISDOL) 1.25 MG (50000 UNIT) CAPS capsule Take 50,000 Units by mouth every 7 (seven) days. 04/30/23 04/29/24  [provider]  VITAMIN E PO Take 1 tablet by mouth daily.    [provider]      Allergies    Benadryl [diphenhydramine hcl], Codeine, Haldol [haloperidol decanoate], Amoxicillin, and Bee venom    Review of Systems   Review of Systems  Constitutional:  Positive for chills. Negative for fever.  Respiratory:  Negative for chest tightness and shortness of breath.   Cardiovascular:  Positive for chest pain.  Gastrointestinal:  Positive for abdominal pain, nausea and vomiting. Negative for diarrhea.  Neurological:  Negative for dizziness, syncope and light-headedness.    Physical Exam Updated Vital Signs BP 102/68   Pulse (!) 59   Temp 98.7 F (37.1 C) (Oral)   Resp 13   Wt 84.8 kg   LMP 11/16/2011   SpO2 94%   BMI 31.12 kg/m  Physical Exam Vitals and nursing note reviewed.  Constitutional:      General: She is not in acute distress.    Appearance: Normal appearance. She is obese. She is ill-appearing. She is not diaphoretic.     Comments: Patient appears uncomfortable.  Cardiovascular:     Rate and Rhythm: Normal rate and regular rhythm.     Heart sounds: Normal heart sounds.  Pulmonary:     Effort: Pulmonary effort is normal. No tachypnea, accessory muscle usage or respiratory distress.     Breath sounds: Normal breath sounds and air entry.  Abdominal:     General: Abdomen is flat.     Palpations: Abdomen is soft.     Tenderness: There is generalized abdominal tenderness.       Comments: Abdominal tenderness R > L  Skin:    General: Skin is warm and dry.     Capillary Refill: Capillary refill takes less  than 2 seconds.  Neurological:     Mental Status: She is alert. Mental status is at baseline.  Psychiatric:        Mood and Affect: Mood normal.        Behavior: Behavior normal.     ED Results / Procedures / Treatments   Labs (all labs ordered are listed, but only abnormal results are displayed) Labs Reviewed  COMPREHENSIVE METABOLIC PANEL - Abnormal; Notable for the following components:      Result Value   Potassium 3.2 (*)    Glucose, Bld 203 (*)    Creatinine, Ser 1.47 (*)    Total Bilirubin 2.2 (*)    GFR, Estimated 40 (*)    All other components within normal limits  CBC WITH DIFFERENTIAL/PLATELET - Abnormal; Notable for the  following components:   WBC 20.4 (*)    RBC 3.21 (*)    Hemoglobin 10.3 (*)    HCT 30.5 (*)    RDW 16.5 (*)    Platelets 409 (*)    nRBC 0.4 (*)    Neutro Abs 16.4 (*)    Monocytes Absolute 1.1 (*)    Abs Immature Granulocytes 0.44 (*)    All other components within normal limits  URINALYSIS, W/ REFLEX TO CULTURE (INFECTION SUSPECTED) - Abnormal; Notable for the following components:   Specific Gravity, Urine 1.032 (*)    Glucose, UA 250 (*)    Hgb urine dipstick LARGE (*)    Ketones, ur TRACE (*)    Protein, ur 30 (*)    All other components within normal limits  TROPONIN I (HIGH SENSITIVITY) - Abnormal; Notable for the following components:   Troponin I (High Sensitivity) 50 (*)    All other components within normal limits  LACTIC ACID, PLASMA  LIPASE, BLOOD  TROPONIN I (HIGH SENSITIVITY)    EKG EKG Interpretation Date/Time:  Friday July 06 2023 19:55:20 EST Ventricular Rate:  61 PR Interval:  168 QRS Duration:  114 QT Interval:  486 QTC Calculation: 489 R Axis:   4  Text Interpretation: Normal sinus rhythm Moderate voltage criteria for LVH, may be normal variant ( R in aVL , Cornell product ) Inferior-posterior infarct (cited on or before 01-Jul-2023) Cannot rule out Anterior infarct , age undetermined Abnormal ECG When  compared with ECG of 03-Jul-2023 05:06, Minimal criteria for Anterior infarct are now Present Questionable change in initial forces of Inferior leads T wave inversion no longer evident in Inferior leads QT has lengthened Confirmed by Estanislado Pandy [phone]) on 07/06/2023 7:57:18 PM  Radiology CT ABDOMEN PELVIS W CONTRAST  Result Date: 07/06/2023 CLINICAL DATA:  Epigastric pain radiating to back. Nausea, vomiting. Cholecystectomy 1 week ago. EXAM: CT ABDOMEN AND PELVIS WITH CONTRAST TECHNIQUE: Multidetector CT imaging of the abdomen and pelvis was performed using the standard protocol following bolus administration of intravenous contrast. RADIATION DOSE REDUCTION: This exam was performed according to the departmental dose-optimization program which includes automated exposure control, adjustment of the mA and/or kV according to patient size and/or use of iterative reconstruction technique. CONTRAST:  OMNIPAQUE IOHEXOL 350 MG/ML SOLN COMPARISON:  06/30/2023 FINDINGS: Lower chest: No acute abnormality. Hepatobiliary: Changes of cholecystectomy. Fluid collection in the gallbladder fossa containing gas measures approximately 3.7 x 2.9 cm. In addition, there is high-density fluid adjacent to the right lobe of the liver and in the pelvis concerning for blood/hematoma. This is stable since prior study. Pancreas: No focal abnormality or ductal dilatation. Spleen: No focal abnormality.  Normal size. Adrenals/Urinary Tract: Small bilateral renal cysts, stable. No follow-up imaging recommended. No stones or hydronephrosis. Adrenal glands and urinary bladder unremarkable. Stomach/Bowel: Stomach, large and small bowel grossly unremarkable. Vascular/Lymphatic: Scattered aortic calcifications. No evidence of aneurysm or adenopathy. Reproductive: Uterus and adnexa unremarkable.  No mass. Other: High-density free fluid in the pelvis, likely blood/hemoperitoneum, slightly decreased since prior study. Musculoskeletal: No  acute bony abnormality. IMPRESSION: Fluid collection in the gallbladder fossa is not significantly changed since prior study and continues to contain gas. This could reflect postoperative fluid collection such as seroma or abscess. Continued blood/hematoma surrounding the right hepatic lobe and extending in the right paracolic gutter, stable since prior study. Blood layering in the cul-de-sac of the pelvis slightly decreased since prior study. Aortic atherosclerosis. Electronically Signed   By: Charlett Nose M.D.  On: 07/06/2023 21:51    Procedures Procedures  {Document cardiac monitor, telemetry assessment procedure when appropriate:1}  Medications Ordered in ED Medications  HYDROmorphone (DILAUDID) injection 0.5 mg (0.5 mg Intravenous Given 07/06/23 2048)  ondansetron (ZOFRAN) injection 4 mg (4 mg Intravenous Given 07/06/23 2048)  lactated ringers bolus 1,000 mL (0 mLs Intravenous Stopped 07/06/23 2217)  iohexol (OMNIPAQUE) 350 MG/ML injection 100 mL (100 mLs Intravenous Contrast Given 07/06/23 2056)  HYDROmorphone (DILAUDID) injection 0.5 mg (0.5 mg Intravenous Given 07/06/23 2129)    ED Course/ Medical Decision Making/ A&P   {   Click here for ABCD2, HEART and other calculatorsREFRESH Note before signing :1}                              Medical Decision Making Amount and/or Complexity of Data Reviewed Labs: ordered. Radiology: ordered.  Risk Prescription drug management.   This patient presents to the ED with chief complaint(s) of abdominal pain, nausea, vomiting with pertinent past medical history of s/p cholecystectomy, MI, CABG, HTN.  The complaint involves an extensive differential diagnosis and also carries with it a high risk of complications and morbidity.    The differential diagnosis includes post op complication, intra-abdominal infection, expanding liver hematoma   The initial plan is to obtain labs, CT  Additional history obtained: Records reviewed previous  admission documents  Initial Assessment:   Exam significant for ill-appearing patient who is not in acute distress.  She does appear uncomfortable.  Abdomen is soft with generalized tenderness.  Tenderness is worse on the right side.  There are 2 large areas of ecchymosis surrounding surgical incision sites.  No increased warmth, erythema, purulent drainage, or evidence of wound dehiscence.  Lungs are clear to auscultation bilaterally.  Heart rate is normal in the 60s with regular rhythm.  Vitals are stable.  Patient is afebrile.   Independent ECG/labs interpretation:  The following labs were independently interpreted:  CBC with leukocytosis, WBC 20.4.  This has increased from previous values, patient was downtrending prior to discharge from hospital.  Hemoglobin is stable at 10.3.  UA with large amount of microscopic hemoglobin, but no evidence of infection.  Metabolic panel with mild hypokalemia.  Creatinine is elevated above patient's baseline.  Anion gap normal.  Initial troponin 50, much improved from few days ago.  Lipase unremarkable.  Repeat troponin down trending.  Independent visualization and interpretation of imaging: I independently visualized the following imaging with scope of interpretation limited to determining acute life threatening conditions related to emergency care: CT abdomen/pelvis, which revealed stable post-operative changes.  No evidence of expanding hematoma or intra-abdominal infection.    Treatment and Reassessment: Patient given 2 IV doses of Dilaudid and Zofran.  Patient also given 1L IV fluids.  Upon reassessment, patient reports feeling significantly better.  She reports she has no pain or nausea.  Patient given water for PO challenge.    Elevated WBCs may be reactive or inflammatory as there is no evidence of acute infection at this time.  Consultations obtained:   I requested consultation with on-call general surgery provider and spoke with Dr. Lysle Rubens.   Dr. Azucena Cecil reviewed patient's labs and imaging.  Patient's imaging is stable from prior and there is no significant change to explain patient's white count.  Patient does not need intervention from a surgical standpoint.  Dr. Azucena Cecil feels that if patient is able to tolerate PO, pain and nausea are well-controlled, that she  would be appropriate for an outpatient follow-up with medications.  However, if patient continues to have severe pain, patient would likely benefit from hospital admission.    Disposition:   Patient reports she has follow up with cardiology and PCP next week.  Advised patient to have her blood work rechecked next week.  Plan to send patient home with Zofran and pain medicine to manage symptoms.  Stressed to patient importance of staying well hydrated.    Social Determinants of Health:   Patient's  tobacco dependence   increases the complexity of managing their presentation   {Document critical care time when appropriate:1} {Document review of labs and clinical decision tools ie heart score, Chads2Vasc2 etc:1}  {Document your independent review of radiology images, and any outside records:1} {Document your discussion with family members, caretakers, and with consultants:1} {Document social determinants of health affecting pt's care:1} {Document your decision making why or why not admission, treatments were needed:1} Final Clinical Impression(s) / ED Diagnoses Final diagnoses:  None    Rx / DC Orders ED Discharge Orders     None

## 2023-07-06 NOTE — ED Notes (Signed)
Pt to CT via stretcher

## 2023-07-06 NOTE — ED Notes (Signed)
Pt unable to provide a urine sample at this time.

## 2023-07-06 NOTE — ED Notes (Signed)
Patient very upset stating she cannot wait to be seen. States she is calling her son to take her to another hospital. IV from ems removed and patient left with son.

## 2023-07-07 LAB — TROPONIN I (HIGH SENSITIVITY): Troponin I (High Sensitivity): 45 ng/L — ABNORMAL HIGH (ref <18)

## 2023-07-07 MED ORDER — ONDANSETRON 4 MG PO TBDP: 4.0000 mg | ORAL_TABLET | Freq: Three times a day (TID) | ORAL | 0 refills | Status: DC | PRN

## 2023-07-07 MED ORDER — OXYCODONE-ACETAMINOPHEN 5-325 MG PO TABS: 1.0000 | ORAL_TABLET | Freq: Four times a day (QID) | ORAL | 0 refills | Status: DC | PRN

## 2023-07-07 NOTE — Discharge Instructions (Addendum)
Thank you for allowing [place] to be a part of your care today.  You were evaluated in the ED for severe abdominal pain and vomiting.  Your workup is overall reassuring and your CT scan showed stable findings.  There is no obvious infection causing your symptoms.  I have sent in a prescription for Zofran to help with nausea and vomiting.  Use this as needed.  Is important that you increase your fluids over the next several days and drink as much as you can.  You may incorporate sugar-free Gatorade, Powerade, or other electrolyte drinks as well.  Follow a bland diet.  I have attached helpful information about this diet.  Follow-up with your cardiologist and your primary care provider as scheduled next week.  Return to the ED if you develop sudden worsening of your symptoms, have pain and vomiting despite medication, or if you have any new concerns.

## 2023-07-08 NOTE — Progress Notes (Unsigned)
Cardiology Office Note:  .   Date:  07/10/2023  ID:  [name], DOB 1960/10/08, MRN [id number] PCP: Macy Mis, MD  [facility] Providers Cardiologist:  Nanetta Batty, MD {    History of Present Illness: .   [name] is a 62 y.o. female   with a history of CAD s/p prior PCI with subsequent CABG x 3 with LIMA-LAD, SVG-ramus, SVG-PDA in 2008, hypertension, hyperlipidemia, and OSA being seen for chest pain in the setting of recent lap chole complicated by bleeding and anemia.   Continue medical management of her known disease. She did not think the increased Imdur to 120 mg has helped her stable angina . She is on 500 mg Ranexa twice daily and she is in agreement.Contimed om DAPT. Was to keep Hgb 8.0 or above.   Unfortunately the patient was hospitalized after laparoscopic cholecystectomy due to significant anemia.  The patient also had dehiscence of the umbilical incision.  She is found to have elevated troponins which was found to be related to demand ischemia EKG revealed old inferior infarct pattern with subtle lateral ST-T wave changes.  She was found to have progressive anemia which was found to be etiology of demand ischemia.  Echocardiogram on 07/01/2023 during hospitalization revealed LVEF of 50 to 55% with regional wall motion abnormalities.  She was found to have aortic valve sclerosis with no evidence of aortic valve stenosis.  She was taken off of Plavix in the setting of anemia by surgeon.  She is due for follow-up labs by PCP tomorrow.  She comes today very anxious and worried as she was told she had a heart attack.  She was also told that she had no interventional areas on last cardiac catheterization and was to be treated medically.  She said she is scared because she does not know what will happen concerning her heart as a result of all of this.  She also reports that she is going to have another surgery to have Inspira device placed with final consult  to plan this in December.  She continues to have chest pressure and fatigue.  She states that she has not gotten her energy back and is taking longer to recover from her recent surgery.   ROS: As above otherwise negative.  Studies Reviewed: .      Echo 07/01/23:  1. Left ventricular ejection fraction, by estimation, is 50 to 55%. The  left ventricle has low normal function. The left ventricle demonstrates  regional wall motion abnormalities (see scoring diagram/findings for  description). Left ventricular diastolic   parameters were normal.   2. Right ventricular systolic function is normal. The right ventricular  size is normal. Tricuspid regurgitation signal is inadequate for assessing  PA pressure.   3. Left atrial size was moderately dilated.   4. The mitral valve is normal in structure. No evidence of mitral valve  regurgitation. No evidence of mitral stenosis.   5. The aortic valve is tricuspid. Aortic valve regurgitation is not  visualized. Aortic valve sclerosis is present, with no evidence of aortic  valve stenosis.   6. The inferior vena cava is normal in size with greater than 50%  respiratory variability, suggesting right atrial pressure of 3 mmHg.   EKG Interpretation Date/Time:    Ventricular Rate:    PR Interval:    QRS Duration:    QT Interval:    QTC Calculation:   R Axis:      Text Interpretation:  Physical Exam:   VS:  BP 110/68 (BP Location: Left Arm, Patient Position: Sitting, Cuff Size: Normal)   Pulse 72   Ht 5\' 5"  (1.651 m)   Wt 192 lb 12.8 oz (87.5 kg)   LMP 11/16/2011   SpO2 94%   BMI 32.08 kg/m     Wt Readings from Last 3 Encounters:  07/10/23 192 lb 12.8 oz (87.5 kg)  07/06/23 187 lb (84.8 kg)  07/06/23 192 lb 14.4 oz (87.5 kg)    GEN: Well nourished, well developed in no acute distress, tearful and anxious NECK: No JVD; No carotid bruits CARDIAC: RRR, harsh 2/6 systolic murmur, no rubs, gallops RESPIRATORY:  Clear to  auscultation without rales, wheezing or rhonchi  ABDOMEN: Soft, non-tender, non-distended EXTREMITIES:  No edema; No deformity   ASSESSMENT AND PLAN: .    Coronary artery disease: History of coronary artery bypass grafting (LIMA to LAD, SVG to ramus, SVG to PDA drug-eluting stent to the origin of the mid graft lesion 06/23/2019.  She is continued on medical management only.  She does have chronic angina and is now on isosorbide 30 mg daily and Ranexa 500 mg twice daily.  If symptoms are persistent would increase Ranexa to 1000 mg twice daily.  Remains on metoprolol.  Continue aggressive secondary risk modification with lipid management, blood pressure control, weight loss, and purposeful exercise.  2.  Demand ischemia: Found during hospitalization status post laparoscopic cholecystectomy in the setting of bleeding postoperatively with hemoglobin less than 8.5.  Plavix was discontinued by surgeon.  Due to follow-up with surgeon within the next few weeks.  Would recommend restarting when she is fully healed.  Follow-up labs are being drawn by PCP tomorrow.  3.  Hypercholesterolemia: She is on Praluent 150 mcg injection every 14 days along with atorvastatin 80 mg daily.  Goal of LDL less than 50.    4.  Hypertension: Blood pressure is very well-controlled.  No changes in her regimen would continue amlodipine, metoprolol, as well as continuing isosorbide.  If she begins to have hypotension or dizziness she is to report this at which time we may need to adjust isosorbide.  5.  OSA: Plan for Inspira implantation.  Following up with consulting surgeon in December 2024.      Signed, Bettey Mare. Liborio Nixon, ANP, AACC

## 2023-07-09 ENCOUNTER — Encounter (INDEPENDENT_AMBULATORY_CARE_PROVIDER_SITE_OTHER): Payer: Medicare Other | Admitting: Otolaryngology

## 2023-07-10 ENCOUNTER — Encounter: Payer: Self-pay | Admitting: Adult Health

## 2023-07-10 ENCOUNTER — Ambulatory Visit: Payer: Medicare Other | Attending: Adult Health | Admitting: Adult Health

## 2023-07-10 VITALS — BP 110/68 | HR 72 | Ht 65.0 in | Wt 192.8 lb

## 2023-07-10 DIAGNOSIS — I2510 Atherosclerotic heart disease of native coronary artery without angina pectoris: Secondary | ICD-10-CM | POA: Insufficient documentation

## 2023-07-10 DIAGNOSIS — Z951 Presence of aortocoronary bypass graft: Secondary | ICD-10-CM | POA: Insufficient documentation

## 2023-07-10 DIAGNOSIS — I10 Essential (primary) hypertension: Secondary | ICD-10-CM | POA: Diagnosis present

## 2023-07-10 DIAGNOSIS — G4733 Obstructive sleep apnea (adult) (pediatric): Secondary | ICD-10-CM | POA: Diagnosis present

## 2023-07-10 DIAGNOSIS — E7800 Pure hypercholesterolemia, unspecified: Secondary | ICD-10-CM | POA: Insufficient documentation

## 2023-07-10 DIAGNOSIS — Z9861 Coronary angioplasty status: Secondary | ICD-10-CM | POA: Insufficient documentation

## 2023-07-10 MED ORDER — PRALUENT 150 MG/ML ~~LOC~~ SOAJ: 150.0000 mg | SUBCUTANEOUS | 6 refills | Status: DC

## 2023-07-10 NOTE — Patient Instructions (Signed)
Medication Instructions:  No Changes *If you need a refill on your cardiac medications before your next appointment, please call your pharmacy*   Lab Work: No labs If you have labs (blood work) drawn today and your tests are completely normal, you will receive your results only by: MyChart Message (if you have MyChart) OR A paper copy in the mail If you have any lab test that is abnormal or we need to change your treatment, we will call you to review the results.   Testing/Procedures: No Testing   Follow-Up: At [facility], you and your health needs are our priority.  As part of our continuing mission to provide you with exceptional heart care, we have created designated Provider Care Teams.  These Care Teams include your primary Cardiologist (physician) and Advanced Practice Providers (APPs -  Physician Assistants and Nurse Practitioners) who all work together to provide you with the care you need, when you need it.  We recommend signing up for the patient portal called "MyChart".  Sign up information is provided on this After Visit Summary.  MyChart is used to connect with patients for Virtual Visits (Telemedicine).  Patients are able to view lab/test results, encounter notes, upcoming appointments, etc.  Non-urgent messages can be sent to your provider as well.   To learn more about what you can do with MyChart, go to [email or web address].    Your next appointment:   January 2025  Provider:   Nanetta Batty, MD

## 2023-07-27 ENCOUNTER — Encounter: Payer: Self-pay | Admitting: *Deleted

## 2023-08-06 ENCOUNTER — Ambulatory Visit (INDEPENDENT_AMBULATORY_CARE_PROVIDER_SITE_OTHER): Payer: Medicare Other | Admitting: Otolaryngology

## 2023-08-06 ENCOUNTER — Encounter (INDEPENDENT_AMBULATORY_CARE_PROVIDER_SITE_OTHER): Payer: Self-pay | Admitting: Otolaryngology

## 2023-08-06 ENCOUNTER — Telehealth: Payer: Self-pay | Admitting: *Deleted

## 2023-08-06 VITALS — BP 130/80 | HR 71

## 2023-08-06 DIAGNOSIS — F17210 Nicotine dependence, cigarettes, uncomplicated: Secondary | ICD-10-CM

## 2023-08-06 DIAGNOSIS — F17200 Nicotine dependence, unspecified, uncomplicated: Secondary | ICD-10-CM

## 2023-08-06 DIAGNOSIS — Z789 Other specified health status: Secondary | ICD-10-CM | POA: Diagnosis not present

## 2023-08-06 DIAGNOSIS — G4733 Obstructive sleep apnea (adult) (pediatric): Secondary | ICD-10-CM

## 2023-08-06 NOTE — Progress Notes (Signed)
ENT Progress Note  Update 08/06/23:  Discussed the use of AI scribe software for clinical note transcription with the patient, who gave verbal consent to proceed.  History of Present Illness   She returns for f/u to discuss Inspire. Hx of heart disease, s/p CABG, and obstructive sleep apnea, presents for a follow-up consultation. She reports a recent hospital admission due to a post-operative hematoma and concurrent heart attack following a gallbladder surgery, which occurred within 24 hours of her gallbladder surgery.   Since her hospital discharge, the patient reports feeling well and is eager to proceed with the Inspire implant hoping it will improve her sleep quality. She has been cleared by her cardiologist in the past, before the more recent MI.  The patient also reports a history of insomnia, which she understands will not be resolved by the upcoming procedure. She has a mild history of COPD, but does not experience hypoxia when awake and no O2 requirement.   The patient has been taken off blood thinners due to the recent hematoma, but anticipates resuming them following her next cardiology appointment 08/27/23. She also reports intermittent smoking, but is making efforts to quit and is willing to abstain before and after the surgery.  Records reviewed  06/28/24  - had laparoscopic gallbladder surgery and then admitted 07/06/23 2/2 bleeding and wound dehiscence  Seen by Cards 07/10/23 - due to elevated trop was sent to see Cards post-admission  Coronary artery disease: History of coronary artery bypass grafting (LIMA to LAD, SVG to ramus, SVG to PDA drug-eluting stent to the origin of the mid graft lesion 06/23/2019.  She is continued on medical management only.  She does have chronic angina and is now on isosorbide 30 mg daily and Ranexa 500 mg twice daily.  If symptoms are persistent would increase Ranexa to 1000 mg twice daily.  Remains on metoprolol.  Continue aggressive secondary risk  modification with lipid management, blood pressure control, weight loss, and purposeful exercise.   2.  Demand ischemia: Found during hospitalization status post laparoscopic cholecystectomy in the setting of bleeding postoperatively with hemoglobin less than 8.5.  Plavix was discontinued by surgeon.  Due to follow-up with surgeon within the next few weeks.  Would recommend restarting when she is fully healed.  Follow-up labs are being drawn by PCP tomorrow.   3.  Hypercholesterolemia: She is on Praluent 150 mcg injection every 14 days along with atorvastatin 80 mg daily.  Goal of LDL less than 50.     4.  Hypertension: Blood pressure is very well-controlled.  No changes in her regimen would continue amlodipine, metoprolol, as well as continuing isosorbide.  If she begins to have hypotension or dizziness she is to report this at which time we may need to adjust isosorbide.   5.  OSA: Plan for Inspira implantation.  Following up with consulting surgeon in December 2024.     Initial Consult 05/29/23 Chief Complaint: OSA, CPAP intolerance and other chronic problems  Referring sleep doc: Dr Vassie Loll  HPI: Pt is a 62 y.o. year old female with a CAD s/p CABG, HTN, hx of NSTEMI, HLD, bipolar disorder, severe OSA, GERD here for evaluation of OSA and CPAP intolerance as well as other chronic problems. She has h/o OSA dx approx 2010. Since diagnosis, pt has not been able to tolerate their CPAP device. AHI on most recent diagnostic sleep study was 33.2 on 10/14/2022. Pt has not been able to tolerate CPAP due to mask pressure on her face,  and not being able to sleep well with the CPAP mask on. Pt has tried multiple masks for their OSA. Pt had a DISE exam in the past, and was considered a candidate for Inspire surgery. Pt  had no recent weight changes but reports weight gain after the birth of her youngest son with inability to lose weight following her pregnancy.  Denies history of insomnia.  Current smoker but in the  process of cutting down.  She is being treated for COPD, on inhalers.  She reports heart attack with cardiac arrest during cardiac cath in 2009. She is f/b Cardiology Dr Allyson Sabal and has a diagnosis of COPD. She was able to cut down from 3 PPD to 2 cigarettes a day. She is f/b [facility], and on inhalers. She reports remote history of cocaine addiction, no recent drug use, but but avoid narcotic pain medications.   Records reviewed  Dr Reginia Naas Op Note for DISE - passed 05/17/23  Note by Mic[facility] 04/30/23 62 year old female, active smoker followed for COPD with chronic bronchitis and emphysema. She is a former patient of Dr. Ulyses Jarred and last seen in office 03/23/2023. Past medical history significant for CAD s/p CABG, HTN, hx of NSTEMI, HLD, bipolar disorder, severe OSA, GERD.   TEST/EVENTS:  08/25/2007 PSG: AHI 20.6/h >> optimal CPAP pressure 7 cmH2O 10/14/2022 HST: AHI 33.2/h, SpO2 low 80%   03/22/2022: OV with Dr. Kendrick Fries. Coughing up white mucus for years; no change. Recently had a flare up after respiratory infection following exposure to sick kids. Treated with medications but can't remember the names. DOE most of her adult life. Daytime fatigue; snores a lot. Advanced cardiac disease, CAD. Previous CABG. Spirometry showed moderate airflow obstruction. Still smoking, likely contributing to symptoms. Noncompliant with Breo. Will switch to Anoro. Smoking cessation advised. Had CT outside - will request records. Split night sleep study ordered. Follow up 1 week.   05/02/2023: Today - follow up Patient presents today for overdue follow up. Her breathing has been about the same since she was here last. No worse but no better. Not currently on any scheduled inhalers. She wasn't sure the Anoro really helped her. Doesn't remember much about her response to [facility] She does use her albuterol daily. No recent exacerbations requiring steroids/abx. She has a daily cough with white phlegm, which is  unchanged. Notices an occasional wheeze. She does struggle with reflux. She has not been on anything recently. She has this regardless of what she eats but definitely avoids certain trigger foods. Denies fevers, chills, hemoptysis, leg swelling, orthopnea, anorexia, weight loss. No difficulties swallowing, abd pain, changes in bowel habits, hoarse voice. She is seeing an ENT for chronic sinusitis symptoms. No worse than normal.    She has had a repeat sleep study since she was here last. She has severe sleep apnea with AHI 33.2/h from [facility] 10/14/2022. She has significant daytime fatigue and restless sleep at night. Never feels rested. She was originally diagnosed with OSA in 2009. She was on CPAP but could not tolerate it even after a CPAP desensitization study. She tried multiple masks and setting adjustments. She has PTSD and is just unable to wear something on her face at night. She feels like her symptoms have gotten worse over the past few years. She is interested in Jackson device.     Past Medical/Surgical History She  Her  has a past surgical history that includes ORIF foot fracture (Left); C-SECTIONS X 2 (1985, 1988);  TOE SURGERIES TO REMOVE NAILS DUE TO CLUBBING; Total knee arthroplasty (Right, 10/21/2012); Cardiac catheterization (12/10/2006; 12/18/2006; 07/10/2007; 07/19/2009); Cardiac catheterization (03/04/2010); Knee arthroscopy with anterior cruciate ligament (acl) repair (Right, 01/30/2008); Coronary artery bypass graft (07/16/2007); Novasure ablation (12/19/2007); Flexible bronchoscopy (07/16/2007); Club foot release (Bilateral); Hand surgery (Bilateral); Hammertoe reconstruction with weil osteotomy (Right, 06/03/2015); Cardiac catheterization (N/A, 02/15/2016); LEFT HEART CATH AND CORS/GRAFTS ANGIOGRAPHY (N/A, 06/23/2019); CORONARY STENT INTERVENTION (N/A, 06/23/2019); LEFT HEART CATH AND CORS/GRAFTS ANGIOGRAPHY (N/A, 03/01/2020); Colonoscopy; Upper gastrointestinal endoscopy; Drug induced  endoscopy (N/A, 05/17/2023); and Cholecystectomy (N/A, 06/29/2023).  Past Family/Social History Her family history includes Anesthesia problems in her father; Breast cancer in her maternal aunt and maternal aunt; Colon polyps in her sister; Heart disease in her father; Kidney cancer in her mother; Lung cancer in her mother. She  reports that she has been smoking cigarettes. She started smoking about 37 years ago. She has a 75.9 pack-year smoking history. She has never used smokeless tobacco. She reports that she does not drink alcohol and does not use drugs.  Medications/Allergies/Immunizations Allergies: Benadryl [diphenhydramine hcl], Codeine, Haldol [haloperidol decanoate], Amoxicillin, and Bee venom, Immunizations:  Immunization History  Administered Date(s) Administered   Influenza, Seasonal, Injecte, Preservative Fre 06/19/2023   PNEUMOCOCCAL CONJUGATE-20 04/27/2023    Review of Systems  ROS: Constitutional: Negative for fever, weight loss and weight gain. Cardiovascular: Negative for chest pain and dyspnea on exertion. Respiratory: Is not experiencing shortness of breath at rest. Gastrointestinal: Negative for nausea and vomiting. Neurological: Negative for headaches. Psychiatric: The patient is not nervous/anxious  OBJECTIVE:  Physical Exam Blood pressure 130/80, pulse 71, last menstrual period 11/16/2011, SpO2 97%. General:  Well-developed, well-nourished, no apparent distress Respiratory Respiratory effort:  Equal inspiration and expiration without stridor Auscultation:  Equal breath sounds bilaterally Cardiovascular Heart:  regular rate and rhythm Peripheral Vascular:  Warm extremities with equal pulses Eyes: No nystagmus with equal extraocular motion bilaterally Neuro/Psych/Balance: Patient oriented to person, place, and time;  Appropriate mood and affect;  Gait is intact with no imbalance; Cranial nerves I-XII are intact Head and Face Inspection:  Normocephalic and  atraumatic without mass or lesion Palpation:  Facial skeleton intact without bony stepoffs Salivary Glands:  No masses or tenderness Facial Strength:  Facial motility symmetric and full bilaterally ENT Pinna:  External ear intact and fully developed bilaterally External canal:  Canal is patent with intact skin bilaterally Tympanic Membrane:  Clear and mobile bilaterally External nose:  No scar or anatomic deformity Internal Nose:  Septum is deviated to the left.  Mucosal edema present, no polyps, bilateral inferior turbinate hypertrophy presents Lips, Teeth, and gums:  Mucosa and teeth intact and viable, somewhat dry mucous membranes and scrappable white residue along the dorsal tongue, suspect thrush TMJ:  No pain to palpation with full mobility Oral cavity/oropharynx:  No erythema or exudate, No tonsils Tongue/palate position: Friedman 2-3 Nasopharynx:  No mass or lesion with intact mucosa Hypopharynx:  Intact mucosa without pooling of secretions Larynx:  Full true vocal cord mobility without lesion or mass Neck Neck and Trachea:  Midline trachea without mass or lesion Thyroid:  No mass or nodularity Lymphatics:  No lymphadenopathy  Preoperative diagnosis: OSA  Postoperative diagnosis:   Same + GERD LPR nasal congestion  Procedure: Flexible fiberoptic laryngoscopy  Surgeon: Ashok Croon, MD  Anesthesia: Topical lidocaine and Afrin Complications: None Condition is stable throughout exam  Indications and consent:  The patient presents to the clinic with Indirect laryngoscopy view was incomplete. Thus it was recommended  that they undergo a flexible fiberoptic laryngoscopy. All of the risks, benefits, and potential complications were reviewed with the patient preoperatively and verbal informed consent was obtained.  Procedure: The patient was seated upright in the clinic. Topical lidocaine and Afrin were applied to the nasal cavity. After adequate anesthesia had occurred, I then  proceeded to pass the flexible telescope into the nasal cavity. The nasal cavity was patent without rhinorrhea or polyp. The nasopharynx was also patent without mass or lesion. The base of tongue was visualized and was normal. There were no signs of pooling of secretions in the piriform sinuses. The true vocal folds were mobile bilaterally. There were no signs of glottic or supraglottic mucosal lesion or mass. There was moderate interarytenoid pachydermia and post cricoid edema. The telescope was then slowly withdrawn and the patient tolerated the procedure throughout.   ASSESSMENT/PLAN: Encounter Diagnoses  Name Primary?   OSA (obstructive sleep apnea) Yes   Intolerance of continuous positive airway pressure (CPAP) ventilation    Tobacco use disorder     OSA, severe, without multilevel collapse, with failure to tolerate PAP therapy and/or more conservative measures. Presence of smaller/absent tonsils and larger tongue position (Friedman tongue position or modified Mallampati) suggests that hypopharyngeal/retrolingual collapse is contributing to the patient's OSA. Zachery Conch, M et al. Staging of obstructive sleep apnea/hypopnea syndrome: a guide to appropriate treatment. Laryngoscope, 2004 Mar, 114(3):454-9. PMID: [id number]) Options including positional therapy, weight loss, oral appliances, PAP and surgical correction discussed. Pt is not ideal candidate for oral appliance due to severity of OSA  Pt is a candidate for Hypoglossal nerve stimulation (Inspire therapy) based on recently completed DISE procedure, BMI and my review of her recent sleep study from 10/14/22 which I reviewed:  AHI of 33.2  16% of central apneas (pAHI of 243, pAHIc of 41) BMI of 31.79 today   -Due to significant comorbidities including CAD history of CABG and current smoking history of COPD will require preoperative cardiology and pulmonary clearance -I discussed the risks and benefits of inspire implant surgery with the  patient today and she would like to proceed -We discussed postoperative recovery expectations and the fact that surgery is normally outpatient procedure -Will schedule for inspire implant surgery at [facility] with anesthesia consult if needed -She reports needing to be admitted after all of her surgeries in the past, we will make a decision about 24-hour [place] on the day of the procedure -Cannot have narcotics for pain control due to remote history of cocaine use we will plan to use Tylenol and NSAIDs for pain control  2.  Chronic nasal congestion -Suspect element of nasal congestion from environmental allergies or irritants including tobacco exposure -I discussed the importance of quitting smoking and she is trying to cut down, total of 5 minutes was spent counseling the patient on importance of quitting smoking -Nasal saline spray and Flonase for nasal congestion 2 puffs bilateral nares twice daily  3. COPD and tobacco use disorder Inhalers per pulmonary recommendations -We discussed smoking cessation today  4.  Oral thrush suspected based on exam today -Nystatin swish and spit 5 ml TID x 14 days  -Will recheck when she returns, denies pain with swallowing, no evidence of extension of thrush into the hypopharynx no laryngeal structure involvement on flexible scope exam today  5. Chronic GERD LPR -Continue Protonix 40 mg daily -We discussed diet and lifestyle changes to minimize reflux -Trial of reflux Gourmet  Update 08/06/23 Assessment and Plan    Obstructive Sleep  Apnea and CPAP intolerance  Cleared by cardiology after admission for hematoma/bleeding following lap gallbladder procedure in November, and blood thinners were held, will see cardiologist in January for f/u. Advised to avoid smoking pre- and post-surgery to enhance recovery. Device is MRI compatible and suitable for travel. Swimming permitted post-recovery. - Schedule procedure after cardiology appointment on August 27, 2023. - Avoid strenuous activities for four weeks post-procedure. - Advise smoking cessation pre- and post-surgery, spent 2 minutes counseling the patient on importance of smoking cessation  - will obs for 24 hrs post-procedure 2/2 multiple co-morbidities and hx of post-op admissions following surgery more than once in the past   Coronary Artery Disease/Demand ischemia and hx of cardiac stents/CABG History of stents and open-heart surgery. Recent myocardial infarction and hospitalization. Off blood thinners due to hematoma following gallbladder procedure; Cardiologist's input needed for blood thinner management around procedure. - Coordinate with cardiologist for blood thinner resumption timing post-procedure - will request clarification - Monitor for cardiac complications during and after procedure.  General Health Maintenance Significantly reduced smoking. Encouraged to quit entirely to improve surgical outcomes and overall health. - Encourage complete smoking cessation pre- and post-surgery.  Follow-up - Follow up with cardiologist on August 27, 2023. - Surgery scheduler to contact for procedure date.      I spent 30 minutes in total face-to-face time and in reviewing records during pre-charting, more than 50% of which was spent in counseling and coordination of care, reviewing test results, reviewing medications and treatment regimen and/or in discussing or reviewing the diagnosis, the prognosis and treatment options. Pertinent laboratory and imaging test results that were available during this visit with the patient were reviewed by me and considered in my medical decision making (see chart for details).

## 2023-08-06 NOTE — Telephone Encounter (Signed)
   Pre-operative Risk Assessment    Patient Name: [name]  DOB: June 15, 1961 MRN: [id number]      Request for Surgical Clearance    Procedure:   INSPIRE  Date of Surgery:  Clearance TBD                                 Surgeon:  [facility] Group or Practice Name:   Phone number:  [phone] Fax number:  [phone]   Type of Clearance Requested:   - Medical  - Pharmacy:  Hold Aspirin NOT MENTIONED ON CLEARANCE FORM, I HAVE LEFT A MESSAGE FOR THE OFFICE TO CALL BACK TO CLARIFY IF NEEDED TO BE HELD PRIOR TO INSPIRE DEVICE   Type of Anesthesia:  General    Additional requests/questions:    Wilhemina Cash   08/06/2023, 1:58 PM

## 2023-08-13 NOTE — Telephone Encounter (Signed)
   Patient Name: TIERAH FORGACS  DOB: 10-18-1960 MRN: [id number]  Primary Cardiologist: Nanetta Batty, MD  Chart reviewed as part of pre-operative protocol coverage. Given past medical history and time since last visit, based on ACC/AHA guidelines, GWENDALYN CAPONE is at acceptable risk for the planned procedure without further cardiovascular testing.   Pt recently seen in the office by Samara Deist, NP on 07/10/2023 who agrees she is stable from a cardiac standpoint.   I will route this recommendation to the requesting party via Epic fax function and remove from pre-op pool.  Please call with questions.  Denyce Robert, NP 08/13/2023, 3:31 PM

## 2023-08-13 NOTE — Telephone Encounter (Signed)
Good morning,   Please review preoperative request and provide recommendations.  Thank you for your help.

## 2023-09-01 ENCOUNTER — Other Ambulatory Visit: Payer: Self-pay | Admitting: Cardiovascular Disease

## 2023-09-10 ENCOUNTER — Telehealth: Payer: Self-pay | Admitting: Pharmacy Technician

## 2023-09-10 ENCOUNTER — Other Ambulatory Visit (HOSPITAL_COMMUNITY): Payer: Self-pay

## 2023-09-10 NOTE — Telephone Encounter (Signed)
Received notification that [facility] would need a prior authorization the next fill. However, I tried to do a prior authorization in cover my meds and it said no prior authorization needed.

## 2023-09-21 ENCOUNTER — Ambulatory Visit: Payer: Medicare Other | Admitting: Cardiovascular Disease

## 2023-09-27 ENCOUNTER — Encounter (INDEPENDENT_AMBULATORY_CARE_PROVIDER_SITE_OTHER): Payer: Self-pay | Admitting: Otolaryngology

## 2023-09-27 ENCOUNTER — Ambulatory Visit (INDEPENDENT_AMBULATORY_CARE_PROVIDER_SITE_OTHER): Payer: Medicare Other | Admitting: Otolaryngology

## 2023-09-27 VITALS — BP 144/92 | HR 77

## 2023-09-27 DIAGNOSIS — G4733 Obstructive sleep apnea (adult) (pediatric): Secondary | ICD-10-CM | POA: Diagnosis not present

## 2023-09-27 DIAGNOSIS — J439 Emphysema, unspecified: Secondary | ICD-10-CM

## 2023-09-27 DIAGNOSIS — R0981 Nasal congestion: Secondary | ICD-10-CM

## 2023-09-27 DIAGNOSIS — F17200 Nicotine dependence, unspecified, uncomplicated: Secondary | ICD-10-CM

## 2023-09-27 DIAGNOSIS — F17210 Nicotine dependence, cigarettes, uncomplicated: Secondary | ICD-10-CM | POA: Diagnosis not present

## 2023-09-27 DIAGNOSIS — Z91198 Patient's noncompliance with other medical treatment and regimen for other reason: Secondary | ICD-10-CM

## 2023-09-27 DIAGNOSIS — K219 Gastro-esophageal reflux disease without esophagitis: Secondary | ICD-10-CM | POA: Diagnosis not present

## 2023-09-27 DIAGNOSIS — R0683 Snoring: Secondary | ICD-10-CM

## 2023-09-27 DIAGNOSIS — Z789 Other specified health status: Secondary | ICD-10-CM

## 2023-09-27 NOTE — H&P (View-Only) (Signed)
 ENT Progress Note  Update 09/27/23:  Discussed the use of AI scribe software for clinical note transcription with the patient, who gave verbal consent to proceed.  History of Present Illness   [name] is a 63 year old female with hx of OSA and CPAP intolerance, who presents for preop evaluation (scheduled for [facility]).   She recently recovered from the flu about a week ago and is doing well otherwise. She is under cardiology care and has been cleared for the procedure with acceptable risk.   She is not currently on tramadol , having used it for only three to four days post-procedure. She is also not on any blood thinners at the moment, as advised by her other physicians, until after her next appointment in March.  She smokes about one pack a month, which is a significant reduction from her previous habit of three packs a day. She plans to quit smoking around the time of her surgery to aid in healing.  She has a large, older cat weighing about 20-25 pounds, which she is advised not to lift for at least the first two weeks post-procedure.     Update 08/06/23:  Discussed the use of AI scribe software for clinical note transcription with the patient, who gave verbal consent to proceed.  History of Present Illness   She returns for f/u to discuss Inspire. Hx of heart disease, s/p CABG, and obstructive sleep apnea, presents for a follow-up consultation. She reports a recent hospital admission due to a post-operative hematoma and concurrent heart attack following a gallbladder surgery, which occurred within 24 hours of her gallbladder surgery.   Since her hospital discharge, the patient reports feeling well and is eager to proceed with the Inspire implant hoping it will improve her sleep quality. She has been cleared by her cardiologist in the past, before the more recent MI.  The patient also reports a history of insomnia, which she understands will not be resolved by the upcoming  procedure. She has a mild history of COPD, but does not experience hypoxia when awake and no O2 requirement.   The patient has been taken off blood thinners due to the recent hematoma, but anticipates resuming them following her next cardiology appointment 08/27/23. She also reports intermittent smoking, but is making efforts to quit and is willing to abstain before and after the surgery.  Records reviewed  06/28/24  - had laparoscopic gallbladder surgery and then admitted 07/06/23 2/2 bleeding and wound dehiscence  Seen by Cards 07/10/23 - due to elevated trop was sent to see Cards post-admission  Coronary artery disease: History of coronary artery bypass grafting (LIMA to LAD, SVG to ramus, SVG to PDA drug-eluting stent to the origin of the mid graft lesion 06/23/2019.  She is continued on medical management only.  She does have chronic angina and is now on isosorbide  30 mg daily and Ranexa  500 mg twice daily.  If symptoms are persistent would increase Ranexa  to 1000 mg twice daily.  Remains on metoprolol .  Continue aggressive secondary risk modification with lipid management, blood pressure control, weight loss, and purposeful exercise.   2.  Demand ischemia: Found during hospitalization status post laparoscopic cholecystectomy in the setting of bleeding postoperatively with hemoglobin less than 8.5.  Plavix  was discontinued by surgeon.  Due to follow-up with surgeon within the next few weeks.  Would recommend restarting when she is fully healed.  Follow-up labs are being drawn by PCP tomorrow.   3.  Hypercholesterolemia: She  is on Praluent  150 mcg injection every 14 days along with atorvastatin  80 mg daily.  Goal of LDL less than 50.     4.  Hypertension: Blood pressure is very well-controlled.  No changes in her regimen would continue amlodipine , metoprolol , as well as continuing isosorbide .  If she begins to have hypotension or dizziness she is to report this at which time we may need to adjust  isosorbide .   5.  OSA: Plan for Inspira implantation.  Following up with consulting surgeon in December 2024.     Initial Consult 05/29/23 Chief Complaint: OSA, CPAP intolerance and other chronic problems  Referring sleep doc: Dr Jude  HPI: Pt is a 64 y.o. year old female with a CAD s/p CABG, HTN, hx of NSTEMI, HLD, bipolar disorder, severe OSA, GERD here for evaluation of OSA and CPAP intolerance as well as other chronic problems. She has h/o OSA dx approx 2010. Since diagnosis, pt has not been able to tolerate their CPAP device. AHI on most recent diagnostic sleep study was 33.2 on 10/14/2022. Pt has not been able to tolerate CPAP due to mask pressure on her face, and not being able to sleep well with the CPAP mask on. Pt has tried multiple masks for their OSA. Pt had a DISE exam in the past, and was considered a candidate for Inspire surgery. Pt  had no recent weight changes but reports weight gain after the birth of her youngest son with inability to lose weight following her pregnancy.  Denies history of insomnia.  Current smoker but in the process of cutting down.  She is being treated for COPD, on inhalers.  She reports heart attack with cardiac arrest during cardiac cath in 2009. She is f/b Cardiology Dr Court and has a diagnosis of COPD. She was able to cut down from 3 PPD to 2 cigarettes a day. She is f/b [facility], and on inhalers. She reports remote history of cocaine addiction, no recent drug use, but but avoid narcotic pain medications.   Records reviewed  Dr Cyndi Op Note for DISE - passed 05/17/23  Note by [facility] 04/30/23 63 year old female, active smoker followed for COPD with chronic bronchitis and emphysema. She is a former patient of Dr. Spurgeon and last seen in office 03/23/2023. Past medical history significant for CAD s/p CABG, HTN, hx of NSTEMI, HLD, bipolar disorder, severe OSA, GERD.   TEST/EVENTS:  08/25/2007 PSG: AHI 20.6/h >> optimal CPAP pressure 7  cmH2O 10/14/2022 HST: AHI 33.2/h, SpO2 low 80%   03/22/2022: OV with Dr. Alaine. Coughing up white mucus for years; no change. Recently had a flare up after respiratory infection following exposure to sick kids. Treated with medications but can't remember the names. DOE most of her adult life. Daytime fatigue; snores a lot. Advanced cardiac disease, CAD. Previous CABG. Spirometry showed moderate airflow obstruction. Still smoking, likely contributing to symptoms. Noncompliant with Breo. Will switch to Anoro. Smoking cessation advised. Had CT outside - will request records. Split night sleep study ordered. Follow up 1 week.   05/02/2023: Today - follow up Patient presents today for overdue follow up. Her breathing has been about the same since she was here last. No worse but no better. Not currently on any scheduled inhalers. She wasn't sure the Anoro really helped her. Doesn't remember much about her response to Breo. She does use her albuterol  daily. No recent exacerbations requiring steroids/abx. She has a daily cough with white phlegm, which  is unchanged. Notices an occasional wheeze. She does struggle with reflux. She has not been on anything recently. She has this regardless of what she eats but definitely avoids certain trigger foods. Denies fevers, chills, hemoptysis, leg swelling, orthopnea, anorexia, weight loss. No difficulties swallowing, abd pain, changes in bowel habits, hoarse voice. She is seeing an ENT for chronic sinusitis symptoms. No worse than normal.    She has had a repeat sleep study since she was here last. She has severe sleep apnea with AHI 33.2/h from [facility] 10/14/2022. She has significant daytime fatigue and restless sleep at night. Never feels rested. She was originally diagnosed with OSA in 2009. She was on CPAP but could not tolerate it even after a CPAP desensitization study. She tried multiple masks and setting adjustments. She has PTSD and is just unable to wear something on her  face at night. She feels like her symptoms have gotten worse over the past few years. She is interested in [place] device.     Past Medical/Surgical History She  Her  has a past surgical history that includes ORIF foot fracture (Left); C-SECTIONS X 2 (1985, 1988); TOE SURGERIES TO REMOVE NAILS DUE TO CLUBBING; Total knee arthroplasty (Right, 10/21/2012); Cardiac catheterization (12/10/2006; 12/18/2006; 07/10/2007; 07/19/2009); Cardiac catheterization (03/04/2010); Knee arthroscopy with anterior cruciate ligament (acl) repair (Right, 01/30/2008); Coronary artery bypass graft (07/16/2007); Novasure ablation (12/19/2007); Flexible bronchoscopy (07/16/2007); Club foot release (Bilateral); Hand surgery (Bilateral); Hammertoe reconstruction with weil osteotomy (Right, 06/03/2015); Cardiac catheterization (N/A, 02/15/2016); LEFT HEART CATH AND CORS/GRAFTS ANGIOGRAPHY (N/A, 06/23/2019); CORONARY STENT INTERVENTION (N/A, 06/23/2019); LEFT HEART CATH AND CORS/GRAFTS ANGIOGRAPHY (N/A, 03/01/2020); Colonoscopy; Upper gastrointestinal endoscopy; Drug induced endoscopy (N/A, 05/17/2023); and Cholecystectomy (N/A, 06/29/2023).  Past Family/Social History Her family history includes Anesthesia problems in her father; Breast cancer in her maternal aunt and maternal aunt; Colon polyps in her sister; Heart disease in her father; Kidney cancer in her mother; Lung cancer in her mother. She  reports that she has been smoking cigarettes. She started smoking about 38 years ago. She has a 76.2 pack-year smoking history. She has never used smokeless tobacco. She reports that she does not drink alcohol and does not use drugs.  Medications/Allergies/Immunizations Allergies: Benadryl  [diphenhydramine  hcl], Codeine, Haldol [haloperidol decanoate], Amoxicillin, and Bee venom, Immunizations:  Immunization History  Administered Date(s) Administered   Influenza, Seasonal, Injecte, Preservative Fre 06/19/2023   PNEUMOCOCCAL CONJUGATE-20  04/27/2023    Review of Systems  ROS: Constitutional: Negative for fever, weight loss and weight gain. Cardiovascular: Negative for chest pain and dyspnea on exertion. Respiratory: Is not experiencing shortness of breath at rest. Gastrointestinal: Negative for nausea and vomiting. Neurological: Negative for headaches. Psychiatric: The patient is not nervous/anxious  OBJECTIVE:  Physical Exam Blood pressure (!) 144/92, pulse 77, last menstrual period 11/16/2011, SpO2 99%. General:  Well-developed, well-nourished, no apparent distress Respiratory Respiratory effort:  Equal inspiration and expiration without stridor Auscultation:  Equal breath sounds bilaterally Cardiovascular Heart:  regular rate and rhythm Peripheral Vascular:  Warm extremities with equal pulses Eyes: No nystagmus with equal extraocular motion bilaterally Neuro/Psych/Balance: Patient oriented to person, place, and time;  Appropriate mood and affect;  Gait is intact with no imbalance; Cranial nerves I-XII are intact Head and Face Inspection:  Normocephalic and atraumatic without mass or lesion Facial Strength:  Facial motility symmetric and full bilaterally ENT Pinna:  External ear intact and fully developed bilaterally External canal:  Canal is patent with intact skin bilaterally Tympanic Membrane:  Clear and mobile bilaterally External  nose:  No scar or anatomic deformity Oral cavity/oropharynx:  No erythema or exudate, No tonsils Tongue/palate position: Friedman 2-3 Neck and Trachea:  Midline trachea without mass or lesion Thyroid :  No mass or nodularity Lymphatics:  No lymphadenopathy  ASSESSMENT/PLAN: Encounter Diagnoses  Name Primary?   OSA (obstructive sleep apnea) Yes   COPD with chronic bronchitis and emphysema (HCC)    Intolerance of continuous positive airway pressure (CPAP) ventilation    Tobacco use disorder    Chronic nasal congestion    Snoring [R06.83]    Chronic GERD      OSA,  severe, without multilevel collapse, with failure to tolerate PAP therapy and/or more conservative measures. Presence of smaller/absent tonsils and larger tongue position (Friedman tongue position or modified Mallampati) suggests that hypopharyngeal/retrolingual collapse is contributing to the patient's OSA. Janeth, M et al. Staging of obstructive sleep apnea/hypopnea syndrome: a guide to appropriate treatment. Laryngoscope, 2004 Mar, 114(3):454-9. PMID: [id number]) Options including positional therapy, weight loss, oral appliances, PAP and surgical correction discussed. Pt is not ideal candidate for oral appliance due to severity of OSA  Pt is a candidate for Hypoglossal nerve stimulation (Inspire therapy) based on recently completed DISE procedure, BMI and my review of her recent sleep study from 10/14/22 which I reviewed:  AHI of 33.2  16% of central apneas (pAHI of 243, pAHIc of 41) BMI of 31.79 today   -Due to significant comorbidities including CAD history of CABG and current smoking history of COPD will require preoperative cardiology and pulmonary clearance -I discussed the risks and benefits of inspire implant surgery with the patient today and she would like to proceed -We discussed postoperative recovery expectations and the fact that surgery is normally outpatient procedure -Will schedule for inspire implant surgery at [facility] with anesthesia consult if needed -She reports needing to be admitted after all of her surgeries in the past, we will make a decision about 24-hour [place] on the day of the procedure -Cannot have narcotics for pain control due to remote history of cocaine use we will plan to use Tylenol  and NSAIDs for pain control  2.  Chronic nasal congestion -Suspect element of nasal congestion from environmental allergies or irritants including tobacco exposure -I discussed the importance of quitting smoking and she is trying to cut down, total of 5 minutes was spent  counseling the patient on importance of quitting smoking -Nasal saline spray and Flonase  for nasal congestion 2 puffs bilateral nares twice daily  3. COPD and tobacco use disorder Inhalers per pulmonary recommendations -We discussed smoking cessation today  4.  Oral thrush suspected based on exam today -Nystatin  swish and spit 5 ml TID x 14 days  -Will recheck when she returns, denies pain with swallowing, no evidence of extension of thrush into the hypopharynx no laryngeal structure involvement on flexible scope exam today  5. Chronic GERD LPR -Continue Protonix  40 mg daily -We discussed diet and lifestyle changes to minimize reflux -Trial of reflux Gourmet  Update 08/06/23 Assessment and Plan    Obstructive Sleep Apnea and CPAP intolerance  Cleared by cardiology after admission for hematoma/bleeding following lap gallbladder procedure in November, and blood thinners were held, will see cardiologist in January for f/u. Advised to avoid smoking pre- and post-surgery to enhance recovery. Device is MRI compatible and suitable for travel. Swimming permitted post-recovery. - Schedule procedure after cardiology appointment on August 27, 2023. - Avoid strenuous activities for four weeks post-procedure. - Advise smoking cessation pre- and post-surgery, spent 2  minutes counseling the patient on importance of smoking cessation  - will obs for 24 hrs post-procedure 2/2 multiple co-morbidities and hx of post-op admissions following surgery more than once in the past   Coronary Artery Disease/Demand ischemia and hx of cardiac stents/CABG History of stents and open-heart surgery. Recent myocardial infarction and hospitalization. Off blood thinners due to hematoma following gallbladder procedure; Cardiologist's input needed for blood thinner management around procedure. - Coordinate with cardiologist for blood thinner resumption timing post-procedure - will request clarification - Monitor for cardiac  complications during and after procedure.  General Health Maintenance Significantly reduced smoking. Encouraged to quit entirely to improve surgical outcomes and overall health. - Encourage complete smoking cessation pre- and post-surgery.  Follow-up - Follow up with cardiologist on August 27, 2023. - Surgery scheduler to contact for procedure date.      I spent 30 minutes in total face-to-face time and in reviewing records during pre-charting, more than 50% of which was spent in counseling and coordination of care, reviewing test results, reviewing medications and treatment regimen and/or in discussing or reviewing the diagnosis, the prognosis and treatment options. Pertinent laboratory and imaging test results that were available during this visit with the patient were reviewed by me and considered in my medical decision making (see chart for details).   Update 09/27/23 Assessment and Plan    Obstructive Sleep Apnea and CPAP intolerance  Scheduled for Inspire  on February 19th. Cleared by cardiology. Procedure involves general anesthesia, intubation, and placement of electrodes along the hypoglossal nerve. Two incisions: one below the right jaw and one in the right upper chest. Device not activated for four weeks postoperatively. Discussed risks: seroma, hematoma, nerve praxia, pneumothorax, and infection and other unforseen complications. Emphasized smoking cessation pre- and postoperatively to aid healing. Patient prefers to go home post-procedure but is open to staying overnight if necessary. Discussed postoperative care including pain management and activity restrictions. - Proceed with surgery on February 19th - Schedule follow-up appointment 10-14 days post-procedure - Schedule device activation appointment 4 weeks post-procedure - Advise against strenuous activities and heavy lifting for the first few weeks postoperatively - Advise against travel immediately post-procedure - Monitor for  signs of seroma, hematoma, nerve praxia, pneumothorax, and infection  Chronic GERD LPR -Continue Protonix  40 mg daily -We discussed diet and lifestyle changes to minimize reflux -Trial of reflux Gourmet  Chronic nasal congestion  - Nasal saline spray and Flonase  for nasal congestion 2 puffs bilateral nares twice daily - smoking cessation   Smoking Cessation Currently smokes about one pack per month, a significant reduction from three packs per day. Discussed importance of quitting smoking, especially in the preoperative and postoperative periods, to improve healing and reduce complications. A total of 5 min spent counseling the patient. Resources for help with quitting smoking provided.  - Advised smoking cessation, especially in the preoperative and postoperative period  Follow-up - Follow up with cardiology in March as scheduled  - Schedule follow-up appointment with otolaryngologist 10-14 days post-procedure - Schedule device activation appointment 4 weeks post-procedure.

## 2023-09-27 NOTE — Progress Notes (Signed)
 ENT Progress Note  Update 09/27/23:  Discussed the use of AI scribe software for clinical note transcription with the patient, who gave verbal consent to proceed.  History of Present Illness   [name] is a 63 year old female with hx of OSA and CPAP intolerance, who presents for preop evaluation (scheduled for [facility]).   She recently recovered from the flu about a week ago and is doing well otherwise. She is under cardiology care and has been cleared for the procedure with acceptable risk.   She is not currently on tramadol , having used it for only three to four days post-procedure. She is also not on any blood thinners at the moment, as advised by her other physicians, until after her next appointment in March.  She smokes about one pack a month, which is a significant reduction from her previous habit of three packs a day. She plans to quit smoking around the time of her surgery to aid in healing.  She has a large, older cat weighing about 20-25 pounds, which she is advised not to lift for at least the first two weeks post-procedure.     Update 08/06/23:  Discussed the use of AI scribe software for clinical note transcription with the patient, who gave verbal consent to proceed.  History of Present Illness   She returns for f/u to discuss Inspire. Hx of heart disease, s/p CABG, and obstructive sleep apnea, presents for a follow-up consultation. She reports a recent hospital admission due to a post-operative hematoma and concurrent heart attack following a gallbladder surgery, which occurred within 24 hours of her gallbladder surgery.   Since her hospital discharge, the patient reports feeling well and is eager to proceed with the Inspire implant hoping it will improve her sleep quality. She has been cleared by her cardiologist in the past, before the more recent MI.  The patient also reports a history of insomnia, which she understands will not be resolved by the upcoming  procedure. She has a mild history of COPD, but does not experience hypoxia when awake and no O2 requirement.   The patient has been taken off blood thinners due to the recent hematoma, but anticipates resuming them following her next cardiology appointment 08/27/23. She also reports intermittent smoking, but is making efforts to quit and is willing to abstain before and after the surgery.  Records reviewed  06/28/24  - had laparoscopic gallbladder surgery and then admitted 07/06/23 2/2 bleeding and wound dehiscence  Seen by Cards 07/10/23 - due to elevated trop was sent to see Cards post-admission  Coronary artery disease: History of coronary artery bypass grafting (LIMA to LAD, SVG to ramus, SVG to PDA drug-eluting stent to the origin of the mid graft lesion 06/23/2019.  She is continued on medical management only.  She does have chronic angina and is now on isosorbide  30 mg daily and Ranexa  500 mg twice daily.  If symptoms are persistent would increase Ranexa  to 1000 mg twice daily.  Remains on metoprolol .  Continue aggressive secondary risk modification with lipid management, blood pressure control, weight loss, and purposeful exercise.   2.  Demand ischemia: Found during hospitalization status post laparoscopic cholecystectomy in the setting of bleeding postoperatively with hemoglobin less than 8.5.  Plavix  was discontinued by surgeon.  Due to follow-up with surgeon within the next few weeks.  Would recommend restarting when she is fully healed.  Follow-up labs are being drawn by PCP tomorrow.   3.  Hypercholesterolemia: She  is on Praluent  150 mcg injection every 14 days along with atorvastatin  80 mg daily.  Goal of LDL less than 50.     4.  Hypertension: Blood pressure is very well-controlled.  No changes in her regimen would continue amlodipine , metoprolol , as well as continuing isosorbide .  If she begins to have hypotension or dizziness she is to report this at which time we may need to adjust  isosorbide .   5.  OSA: Plan for Inspira implantation.  Following up with consulting surgeon in December 2024.     Initial Consult 05/29/23 Chief Complaint: OSA, CPAP intolerance and other chronic problems  Referring sleep doc: Dr Jude  HPI: Pt is a 63 y.o. year old female with a CAD s/p CABG, HTN, hx of NSTEMI, HLD, bipolar disorder, severe OSA, GERD here for evaluation of OSA and CPAP intolerance as well as other chronic problems. She has h/o OSA dx approx 2010. Since diagnosis, pt has not been able to tolerate their CPAP device. AHI on most recent diagnostic sleep study was 33.2 on 10/14/2022. Pt has not been able to tolerate CPAP due to mask pressure on her face, and not being able to sleep well with the CPAP mask on. Pt has tried multiple masks for their OSA. Pt had a DISE exam in the past, and was considered a candidate for Inspire surgery. Pt  had no recent weight changes but reports weight gain after the birth of her youngest son with inability to lose weight following her pregnancy.  Denies history of insomnia.  Current smoker but in the process of cutting down.  She is being treated for COPD, on inhalers.  She reports heart attack with cardiac arrest during cardiac cath in 2009. She is f/b Cardiology Dr Court and has a diagnosis of COPD. She was able to cut down from 3 PPD to 2 cigarettes a day. She is f/b [facility], and on inhalers. She reports remote history of cocaine addiction, no recent drug use, but but avoid narcotic pain medications.   Records reviewed  Dr Cyndi Op Note for DISE - passed 05/17/23  Note by [facility] 04/30/23 63 year old female, active smoker followed for COPD with chronic bronchitis and emphysema. She is a former patient of Dr. Spurgeon and last seen in office 03/23/2023. Past medical history significant for CAD s/p CABG, HTN, hx of NSTEMI, HLD, bipolar disorder, severe OSA, GERD.   TEST/EVENTS:  08/25/2007 PSG: AHI 20.6/h >> optimal CPAP pressure 7  cmH2O 10/14/2022 HST: AHI 33.2/h, SpO2 low 80%   03/22/2022: OV with Dr. Alaine. Coughing up white mucus for years; no change. Recently had a flare up after respiratory infection following exposure to sick kids. Treated with medications but can't remember the names. DOE most of her adult life. Daytime fatigue; snores a lot. Advanced cardiac disease, CAD. Previous CABG. Spirometry showed moderate airflow obstruction. Still smoking, likely contributing to symptoms. Noncompliant with Breo. Will switch to Anoro. Smoking cessation advised. Had CT outside - will request records. Split night sleep study ordered. Follow up 1 week.   05/02/2023: Today - follow up Patient presents today for overdue follow up. Her breathing has been about the same since she was here last. No worse but no better. Not currently on any scheduled inhalers. She wasn't sure the Anoro really helped her. Doesn't remember much about her response to Breo. She does use her albuterol  daily. No recent exacerbations requiring steroids/abx. She has a daily cough with white phlegm, which  is unchanged. Notices an occasional wheeze. She does struggle with reflux. She has not been on anything recently. She has this regardless of what she eats but definitely avoids certain trigger foods. Denies fevers, chills, hemoptysis, leg swelling, orthopnea, anorexia, weight loss. No difficulties swallowing, abd pain, changes in bowel habits, hoarse voice. She is seeing an ENT for chronic sinusitis symptoms. No worse than normal.    She has had a repeat sleep study since she was here last. She has severe sleep apnea with AHI 33.2/h from [facility] 10/14/2022. She has significant daytime fatigue and restless sleep at night. Never feels rested. She was originally diagnosed with OSA in 2009. She was on CPAP but could not tolerate it even after a CPAP desensitization study. She tried multiple masks and setting adjustments. She has PTSD and is just unable to wear something on her  face at night. She feels like her symptoms have gotten worse over the past few years. She is interested in [place] device.     Past Medical/Surgical History She  Her  has a past surgical history that includes ORIF foot fracture (Left); C-SECTIONS X 2 (1985, 1988); TOE SURGERIES TO REMOVE NAILS DUE TO CLUBBING; Total knee arthroplasty (Right, 10/21/2012); Cardiac catheterization (12/10/2006; 12/18/2006; 07/10/2007; 07/19/2009); Cardiac catheterization (03/04/2010); Knee arthroscopy with anterior cruciate ligament (acl) repair (Right, 01/30/2008); Coronary artery bypass graft (07/16/2007); Novasure ablation (12/19/2007); Flexible bronchoscopy (07/16/2007); Club foot release (Bilateral); Hand surgery (Bilateral); Hammertoe reconstruction with weil osteotomy (Right, 06/03/2015); Cardiac catheterization (N/A, 02/15/2016); LEFT HEART CATH AND CORS/GRAFTS ANGIOGRAPHY (N/A, 06/23/2019); CORONARY STENT INTERVENTION (N/A, 06/23/2019); LEFT HEART CATH AND CORS/GRAFTS ANGIOGRAPHY (N/A, 03/01/2020); Colonoscopy; Upper gastrointestinal endoscopy; Drug induced endoscopy (N/A, 05/17/2023); and Cholecystectomy (N/A, 06/29/2023).  Past Family/Social History Her family history includes Anesthesia problems in her father; Breast cancer in her maternal aunt and maternal aunt; Colon polyps in her sister; Heart disease in her father; Kidney cancer in her mother; Lung cancer in her mother. She  reports that she has been smoking cigarettes. She started smoking about 38 years ago. She has a 76.2 pack-year smoking history. She has never used smokeless tobacco. She reports that she does not drink alcohol and does not use drugs.  Medications/Allergies/Immunizations Allergies: Benadryl  [diphenhydramine  hcl], Codeine, Haldol [haloperidol decanoate], Amoxicillin, and Bee venom, Immunizations:  Immunization History  Administered Date(s) Administered   Influenza, Seasonal, Injecte, Preservative Fre 06/19/2023   PNEUMOCOCCAL CONJUGATE-20  04/27/2023    Review of Systems  ROS: Constitutional: Negative for fever, weight loss and weight gain. Cardiovascular: Negative for chest pain and dyspnea on exertion. Respiratory: Is not experiencing shortness of breath at rest. Gastrointestinal: Negative for nausea and vomiting. Neurological: Negative for headaches. Psychiatric: The patient is not nervous/anxious  OBJECTIVE:  Physical Exam Blood pressure (!) 144/92, pulse 77, last menstrual period 11/16/2011, SpO2 99%. General:  Well-developed, well-nourished, no apparent distress Respiratory Respiratory effort:  Equal inspiration and expiration without stridor Auscultation:  Equal breath sounds bilaterally Cardiovascular Heart:  regular rate and rhythm Peripheral Vascular:  Warm extremities with equal pulses Eyes: No nystagmus with equal extraocular motion bilaterally Neuro/Psych/Balance: Patient oriented to person, place, and time;  Appropriate mood and affect;  Gait is intact with no imbalance; Cranial nerves I-XII are intact Head and Face Inspection:  Normocephalic and atraumatic without mass or lesion Facial Strength:  Facial motility symmetric and full bilaterally ENT Pinna:  External ear intact and fully developed bilaterally External canal:  Canal is patent with intact skin bilaterally Tympanic Membrane:  Clear and mobile bilaterally External  nose:  No scar or anatomic deformity Oral cavity/oropharynx:  No erythema or exudate, No tonsils Tongue/palate position: Friedman 2-3 Neck and Trachea:  Midline trachea without mass or lesion Thyroid :  No mass or nodularity Lymphatics:  No lymphadenopathy  ASSESSMENT/PLAN: Encounter Diagnoses  Name Primary?   OSA (obstructive sleep apnea) Yes   COPD with chronic bronchitis and emphysema (HCC)    Intolerance of continuous positive airway pressure (CPAP) ventilation    Tobacco use disorder    Chronic nasal congestion    Snoring [R06.83]    Chronic GERD      OSA,  severe, without multilevel collapse, with failure to tolerate PAP therapy and/or more conservative measures. Presence of smaller/absent tonsils and larger tongue position (Friedman tongue position or modified Mallampati) suggests that hypopharyngeal/retrolingual collapse is contributing to the patient's OSA. Janeth, M et al. Staging of obstructive sleep apnea/hypopnea syndrome: a guide to appropriate treatment. Laryngoscope, 2004 Mar, 114(3):454-9. PMID: [id number]) Options including positional therapy, weight loss, oral appliances, PAP and surgical correction discussed. Pt is not ideal candidate for oral appliance due to severity of OSA  Pt is a candidate for Hypoglossal nerve stimulation (Inspire therapy) based on recently completed DISE procedure, BMI and my review of her recent sleep study from 10/14/22 which I reviewed:  AHI of 33.2  16% of central apneas (pAHI of 243, pAHIc of 41) BMI of 31.79 today   -Due to significant comorbidities including CAD history of CABG and current smoking history of COPD will require preoperative cardiology and pulmonary clearance -I discussed the risks and benefits of inspire implant surgery with the patient today and she would like to proceed -We discussed postoperative recovery expectations and the fact that surgery is normally outpatient procedure -Will schedule for inspire implant surgery at [facility] with anesthesia consult if needed -She reports needing to be admitted after all of her surgeries in the past, we will make a decision about 24-hour [place] on the day of the procedure -Cannot have narcotics for pain control due to remote history of cocaine use we will plan to use Tylenol  and NSAIDs for pain control  2.  Chronic nasal congestion -Suspect element of nasal congestion from environmental allergies or irritants including tobacco exposure -I discussed the importance of quitting smoking and she is trying to cut down, total of 5 minutes was spent  counseling the patient on importance of quitting smoking -Nasal saline spray and Flonase  for nasal congestion 2 puffs bilateral nares twice daily  3. COPD and tobacco use disorder Inhalers per pulmonary recommendations -We discussed smoking cessation today  4.  Oral thrush suspected based on exam today -Nystatin  swish and spit 5 ml TID x 14 days  -Will recheck when she returns, denies pain with swallowing, no evidence of extension of thrush into the hypopharynx no laryngeal structure involvement on flexible scope exam today  5. Chronic GERD LPR -Continue Protonix  40 mg daily -We discussed diet and lifestyle changes to minimize reflux -Trial of reflux Gourmet  Update 08/06/23 Assessment and Plan    Obstructive Sleep Apnea and CPAP intolerance  Cleared by cardiology after admission for hematoma/bleeding following lap gallbladder procedure in November, and blood thinners were held, will see cardiologist in January for f/u. Advised to avoid smoking pre- and post-surgery to enhance recovery. Device is MRI compatible and suitable for travel. Swimming permitted post-recovery. - Schedule procedure after cardiology appointment on August 27, 2023. - Avoid strenuous activities for four weeks post-procedure. - Advise smoking cessation pre- and post-surgery, spent 2  minutes counseling the patient on importance of smoking cessation  - will obs for 24 hrs post-procedure 2/2 multiple co-morbidities and hx of post-op admissions following surgery more than once in the past   Coronary Artery Disease/Demand ischemia and hx of cardiac stents/CABG History of stents and open-heart surgery. Recent myocardial infarction and hospitalization. Off blood thinners due to hematoma following gallbladder procedure; Cardiologist's input needed for blood thinner management around procedure. - Coordinate with cardiologist for blood thinner resumption timing post-procedure - will request clarification - Monitor for cardiac  complications during and after procedure.  General Health Maintenance Significantly reduced smoking. Encouraged to quit entirely to improve surgical outcomes and overall health. - Encourage complete smoking cessation pre- and post-surgery.  Follow-up - Follow up with cardiologist on August 27, 2023. - Surgery scheduler to contact for procedure date.      I spent 30 minutes in total face-to-face time and in reviewing records during pre-charting, more than 50% of which was spent in counseling and coordination of care, reviewing test results, reviewing medications and treatment regimen and/or in discussing or reviewing the diagnosis, the prognosis and treatment options. Pertinent laboratory and imaging test results that were available during this visit with the patient were reviewed by me and considered in my medical decision making (see chart for details).   Update 09/27/23 Assessment and Plan    Obstructive Sleep Apnea and CPAP intolerance  Scheduled for Inspire  on February 19th. Cleared by cardiology. Procedure involves general anesthesia, intubation, and placement of electrodes along the hypoglossal nerve. Two incisions: one below the right jaw and one in the right upper chest. Device not activated for four weeks postoperatively. Discussed risks: seroma, hematoma, nerve praxia, pneumothorax, and infection and other unforseen complications. Emphasized smoking cessation pre- and postoperatively to aid healing. Patient prefers to go home post-procedure but is open to staying overnight if necessary. Discussed postoperative care including pain management and activity restrictions. - Proceed with surgery on February 19th - Schedule follow-up appointment 10-14 days post-procedure - Schedule device activation appointment 4 weeks post-procedure - Advise against strenuous activities and heavy lifting for the first few weeks postoperatively - Advise against travel immediately post-procedure - Monitor for  signs of seroma, hematoma, nerve praxia, pneumothorax, and infection  Chronic GERD LPR -Continue Protonix  40 mg daily -We discussed diet and lifestyle changes to minimize reflux -Trial of reflux Gourmet  Chronic nasal congestion  - Nasal saline spray and Flonase  for nasal congestion 2 puffs bilateral nares twice daily - smoking cessation   Smoking Cessation Currently smokes about one pack per month, a significant reduction from three packs per day. Discussed importance of quitting smoking, especially in the preoperative and postoperative periods, to improve healing and reduce complications. A total of 5 min spent counseling the patient. Resources for help with quitting smoking provided.  - Advised smoking cessation, especially in the preoperative and postoperative period  Follow-up - Follow up with cardiology in March as scheduled  - Schedule follow-up appointment with otolaryngologist 10-14 days post-procedure - Schedule device activation appointment 4 weeks post-procedure.

## 2023-10-03 NOTE — Progress Notes (Signed)
RN contacted Dr. Leighton Roach office and spoke with [facility] regarding the need for surgical orders.

## 2023-10-03 NOTE — Progress Notes (Signed)
Surgical Instructions   Your procedure is scheduled on Wednesday, October 10, 2023. Report to [facility] Main Entrance "A" at 9:00 A.M., then check in with the Admitting office. Any questions or running late day of surgery: call [phone]  Questions prior to your surgery date: call [phone], Monday-Friday, 8am-4pm. If you experience any cold or flu symptoms such as cough, fever, chills, shortness of breath, etc. between now and your scheduled surgery, please notify us at the above number.     Remember:  Do not eat after midnight the night before your surgery   You may drink clear liquids until 8:00 the morning of your surgery.   Clear liquids allowed are: Water, Non-Citrus Juices (without pulp), Carbonated Beverages, Clear Tea (no milk, honey, etc.), Black Coffee Only (NO MILK, CREAM OR POWDERED CREAMER of any kind), and Gatorade.    Take these medicines the morning of surgery with A SIP OF WATER  amLODipine (NORVASC)  atorvastatin (LIPITOR)  Budeson-Glycopyrrol-Formoterol (BREZTRI AEROSPHERE) inhaler  escitalopram (LEXAPRO)  icosapent Ethyl (VASCEPA)  isosorbide mononitrate (IMDUR)  amoTRIgine (LAMICTAL)  metoprolol tartrate (LOPRESSOR)  pantoprazole (PROTONIX)  ranolazine (RANEXA)     May take these medicines IF NEEDED: albuterol (PROVENTIL HFA) inhaler  fluticasone (FLONASE)  nitroGLYCERIN (NITROSTAT)  ondansetron (ZOFRAN-ODT)    One week prior to surgery, STOP taking any Aspirin (unless otherwise instructed by your surgeon) Aleve, Naproxen, Ibuprofen, Motrin, Advil, Goody's, BC's, all herbal medications, fish oil, and non-prescription vitamins.                     Do NOT Smoke (Tobacco/Vaping) for 24 hours prior to your procedure.  If you use a CPAP at night, you may bring your mask/headgear for your overnight stay.   You will be asked to remove any contacts, glasses, piercing's, hearing aid's, dentures/partials prior to surgery. Please bring cases for these  items if needed.    Patients discharged the day of surgery will not be allowed to drive home, and someone needs to stay with them for 24 hours.  SURGICAL WAITING ROOM VISITATION Patients may have no more than 2 support people in the waiting area - these visitors may rotate.   Pre-op nurse will coordinate an appropriate time for 1 ADULT support person, who may not rotate, to accompany patient in pre-op.  Children under the age of 68 must have an adult with them who is not the patient and must remain in the main waiting area with an adult.  If the patient needs to stay at the hospital during part of their recovery, the visitor guidelines for inpatient rooms apply.  Please refer to the [facility] website for the visitor guidelines for any additional information.   If you received a COVID test during your pre-op visit  it is requested that you wear a mask when out in public, stay away from anyone that may not be feeling well and notify your surgeon if you develop symptoms. If you have been in contact with anyone that has tested positive in the last 10 days please notify you surgeon.      Pre-operative CHG Bathing Instructions   You can play a key role in reducing the risk of infection after surgery. Your skin needs to be as free of germs as possible. You can reduce the number of germs on your skin by washing with CHG (chlorhexidine gluconate) soap before surgery. CHG is an antiseptic soap that kills germs and continues to kill germs even after washing.  DO NOT use if you have an allergy to chlorhexidine/CHG or antibacterial soaps. If your skin becomes reddened or irritated, stop using the CHG and notify one of our RNs at [phone].              TAKE A SHOWER THE NIGHT BEFORE SURGERY AND THE DAY OF SURGERY    Please keep in mind the following:  DO NOT shave, including legs and underarms, 48 hours prior to surgery.   You may shave your face before/day of surgery.  Place clean sheets on  your bed the night before surgery Use a clean washcloth (not used since being washed) for each shower. DO NOT sleep with pet's night before surgery.  CHG Shower Instructions:  Wash your face and private area with normal soap. If you choose to wash your hair, wash first with your normal shampoo.  After you use shampoo/soap, rinse your hair and body thoroughly to remove shampoo/soap residue.  Turn the water OFF and apply half the bottle of CHG soap to a CLEAN washcloth.  Apply CHG soap ONLY FROM YOUR NECK DOWN TO YOUR TOES (washing for 3-5 minutes)  DO NOT use CHG soap on face, private areas, open wounds, or sores.  Pay special attention to the area where your surgery is being performed.  If you are having back surgery, having someone wash your back for you may be helpful. Wait 2 minutes after CHG soap is applied, then you may rinse off the CHG soap.  Pat dry with a clean towel  Put on clean pajamas    Additional instructions for the day of surgery: DO NOT APPLY any lotions, deodorants, cologne, or perfumes.   Do not wear jewelry or makeup Do not wear nail polish, gel polish, artificial nails, or any other type of covering on natural nails (fingers and toes) Do not bring valuables to the hospital. [facility] is not responsible for valuables/personal belongings. Put on clean/comfortable clothes.  Please brush your teeth.  Ask your nurse before applying any prescription medications to the skin.

## 2023-10-04 ENCOUNTER — Other Ambulatory Visit: Payer: Self-pay

## 2023-10-04 ENCOUNTER — Encounter (HOSPITAL_COMMUNITY)
Admission: RE | Admit: 2023-10-04 | Discharge: 2023-10-04 | Disposition: A | Discharge status: Home / Self Care | Payer: Medicare Other | Source: Ambulatory Visit | Attending: Otolaryngology | Admitting: Otolaryngology

## 2023-10-04 ENCOUNTER — Encounter (HOSPITAL_COMMUNITY): Payer: Self-pay

## 2023-10-04 VITALS — BP 138/85 | HR 59 | Temp 97.8°F | Resp 17 | Ht 65.5 in | Wt 193.5 lb

## 2023-10-04 DIAGNOSIS — Z7902 Long term (current) use of antithrombotics/antiplatelets: Secondary | ICD-10-CM | POA: Diagnosis not present

## 2023-10-04 DIAGNOSIS — K760 Fatty (change of) liver, not elsewhere classified: Secondary | ICD-10-CM | POA: Diagnosis not present

## 2023-10-04 DIAGNOSIS — I739 Peripheral vascular disease, unspecified: Secondary | ICD-10-CM | POA: Diagnosis not present

## 2023-10-04 DIAGNOSIS — Z955 Presence of coronary angioplasty implant and graft: Secondary | ICD-10-CM | POA: Diagnosis not present

## 2023-10-04 DIAGNOSIS — Z951 Presence of aortocoronary bypass graft: Secondary | ICD-10-CM | POA: Diagnosis not present

## 2023-10-04 DIAGNOSIS — K219 Gastro-esophageal reflux disease without esophagitis: Secondary | ICD-10-CM | POA: Insufficient documentation

## 2023-10-04 DIAGNOSIS — I252 Old myocardial infarction: Secondary | ICD-10-CM | POA: Insufficient documentation

## 2023-10-04 DIAGNOSIS — J4489 Other specified chronic obstructive pulmonary disease: Secondary | ICD-10-CM | POA: Diagnosis not present

## 2023-10-04 DIAGNOSIS — E7800 Pure hypercholesterolemia, unspecified: Secondary | ICD-10-CM | POA: Diagnosis not present

## 2023-10-04 DIAGNOSIS — Z01818 Encounter for other preprocedural examination: Secondary | ICD-10-CM | POA: Diagnosis present

## 2023-10-04 DIAGNOSIS — Z01812 Encounter for preprocedural laboratory examination: Secondary | ICD-10-CM | POA: Insufficient documentation

## 2023-10-04 DIAGNOSIS — I509 Heart failure, unspecified: Secondary | ICD-10-CM | POA: Insufficient documentation

## 2023-10-04 DIAGNOSIS — Z96651 Presence of right artificial knee joint: Secondary | ICD-10-CM | POA: Insufficient documentation

## 2023-10-04 DIAGNOSIS — G4733 Obstructive sleep apnea (adult) (pediatric): Secondary | ICD-10-CM | POA: Insufficient documentation

## 2023-10-04 DIAGNOSIS — I2510 Atherosclerotic heart disease of native coronary artery without angina pectoris: Secondary | ICD-10-CM | POA: Insufficient documentation

## 2023-10-04 DIAGNOSIS — I110 Hypertensive heart disease with heart failure: Secondary | ICD-10-CM | POA: Insufficient documentation

## 2023-10-04 LAB — CBC
HCT: 43.9 % (ref 36.0–46.0)
Hemoglobin: 14.6 g/dL (ref 12.0–15.0)
MCH: 31.2 pg (ref 26.0–34.0)
MCHC: 33.3 g/dL (ref 30.0–36.0)
MCV: 93.8 fL (ref 80.0–100.0)
Platelets: 337 10*3/uL (ref 150–400)
RBC: 4.68 MIL/uL (ref 3.87–5.11)
RDW: 16 % — ABNORMAL HIGH (ref 11.5–15.5)
WBC: 10.2 10*3/uL (ref 4.0–10.5)
nRBC: 0 % (ref 0.0–0.2)

## 2023-10-04 LAB — BASIC METABOLIC PANEL
Anion gap: 10 (ref 5–15)
BUN: 12 mg/dL (ref 8–23)
CO2: 24 mmol/L (ref 22–32)
Calcium: 9.2 mg/dL (ref 8.9–10.3)
Chloride: 106 mmol/L (ref 98–111)
Creatinine, Ser: 0.97 mg/dL (ref 0.44–1.00)
GFR, Estimated: >60 mL/min (ref >60)
Glucose, Bld: 114 mg/dL — ABNORMAL HIGH (ref 70–99)
Potassium: 3.9 mmol/L (ref 3.5–5.1)
Sodium: 140 mmol/L (ref 135–145)

## 2023-10-04 NOTE — Progress Notes (Signed)
Surgical Instructions     Your procedure is scheduled on Wednesday, October 10, 2023. Report to [facility] Main Entrance "A" at 9:00 A.M., then check in with the Admitting office. Any questions or running late day of surgery: call [phone]   Questions prior to your surgery date: call [phone], Monday-Friday, 8am-4pm. If you experience any cold or flu symptoms such as cough, fever, chills, shortness of breath, etc. between now and your scheduled surgery, please notify us at the above number.            Remember:       Do not eat or drink after midnight the night before your surgery           Take these medicines the morning of surgery with A SIP OF WATER  amLODipine (NORVASC)  atorvastatin (LIPITOR)  Budeson-Glycopyrrol-Formoterol (BREZTRI AEROSPHERE) inhaler  escitalopram (LEXAPRO)  icosapent Ethyl (VASCEPA)  isosorbide mononitrate (IMDUR)  amoTRIgine (LAMICTAL)  metoprolol tartrate (LOPRESSOR)  pantoprazole (PROTONIX)  ranolazine (RANEXA)        May take these medicines IF NEEDED: albuterol (PROVENTIL HFA) inhaler please bring with you the day of surgery  fluticasone (FLONASE)  nitroGLYCERIN (NITROSTAT)  ondansetron (ZOFRAN-ODT)      One week prior to surgery, STOP taking any Aspirin (unless otherwise instructed by your surgeon) Aleve, Naproxen, Ibuprofen, Motrin, Advil, Goody's, BC's, all herbal medications, fish oil, and non-prescription vitamins.                     Do NOT Smoke (Tobacco/Vaping) for 24 hours prior to your procedure.   If you use a CPAP at night, you may bring your mask/headgear for your overnight stay.   You will be asked to remove any contacts, glasses, piercing's, hearing aid's, dentures/partials prior to surgery. Please bring cases for these items if needed.    Patients discharged the day of surgery will not be allowed to drive home, and someone needs to stay with them for 24 hours.   SURGICAL WAITING ROOM VISITATION Patients may have  no more than 2 support people in the waiting area - these visitors may rotate.   Pre-op nurse will coordinate an appropriate time for 1 ADULT support person, who may not rotate, to accompany patient in pre-op.  Children under the age of 42 must have an adult with them who is not the patient and must remain in the main waiting area with an adult.   If the patient needs to stay at the hospital during part of their recovery, the visitor guidelines for inpatient rooms apply.   Please refer to the [facility] website for the visitor guidelines for any additional information.     If you received a COVID test during your pre-op visit  it is requested that you wear a mask when out in public, stay away from anyone that may not be feeling well and notify your surgeon if you develop symptoms. If you have been in contact with anyone that has tested positive in the last 10 days please notify you surgeon.         Pre-operative CHG Bathing Instructions    You can play a key role in reducing the risk of infection after surgery. Your skin needs to be as free of germs as possible. You can reduce the number of germs on your skin by washing with CHG (chlorhexidine gluconate) soap before surgery. CHG is an antiseptic soap that kills germs and continues to kill germs even after washing.  DO NOT use if you have an allergy to chlorhexidine/CHG or antibacterial soaps. If your skin becomes reddened or irritated, stop using the CHG and notify one of our RNs at [phone].               TAKE A SHOWER THE NIGHT BEFORE SURGERY AND THE DAY OF SURGERY     Please keep in mind the following:  DO NOT shave, including legs and underarms, 48 hours prior to surgery.   You may shave your face before/day of surgery.  Place clean sheets on your bed the night before surgery Use a clean washcloth (not used since being washed) for each shower. DO NOT sleep with pet's night before surgery.   CHG Shower Instructions:  Wash your  face and private area with normal soap. If you choose to wash your hair, wash first with your normal shampoo.  After you use shampoo/soap, rinse your hair and body thoroughly to remove shampoo/soap residue.  Turn the water OFF and apply half the bottle of CHG soap to a CLEAN washcloth.  Apply CHG soap ONLY FROM YOUR NECK DOWN TO YOUR TOES (washing for 3-5 minutes)  DO NOT use CHG soap on face, private areas, open wounds, or sores.  Pay special attention to the area where your surgery is being performed.  If you are having back surgery, having someone wash your back for you may be helpful. Wait 2 minutes after CHG soap is applied, then you may rinse off the CHG soap.  Pat dry with a clean towel  Put on clean pajamas     Additional instructions for the day of surgery: DO NOT APPLY any lotions, deodorants, cologne, or perfumes.   Do not wear jewelry or makeup Do not wear nail polish, gel polish, artificial nails, or any other type of covering on natural nails (fingers and toes) Do not bring valuables to the hospital. [facility] is not responsible for valuables/personal belongings. Put on clean/comfortable clothes.  Please brush your teeth.  Ask your nurse before applying any prescription medications to the skin.

## 2023-10-04 NOTE — Progress Notes (Signed)
PCP - Dr. Delbert Harness Cardiologist - Dr. Nanetta Batty - clearance received 08-13-23  PPM/ICD - Denies Device Orders - n/a Rep Notified - n/a  Chest x-ray - n/a EKG - 07-10-23 Stress Test - 01-06-22 ECHO - 07-01-23 Cardiac Cath - 03-01-20  Sleep Study - Yes CPAP - wears a CPAP at night  NON-diabetic  Last dose of GLP1 agonist-  Denies GLP1 instructions: n/a  Blood Thinner Instructions: Denies Aspirin Instructions:Denies   ERAS Protcol - NPO PRE-SURGERY Ensure or G2- none  COVID TEST- N/a   Anesthesia review: Yes, HTN, MI, murmur, clotting disorder, COPD  Patient denies shortness of breath, fever, cough and chest pain at PAT appointment. Patient denies any respiratory issues at this time.    All instructions explained to the patient, with a verbal understanding of the material. Patient agrees to go over the instructions while at home for a better understanding. Patient also instructed to self quarantine after being tested for COVID-19. The opportunity to ask questions was provided.

## 2023-10-05 ENCOUNTER — Encounter (HOSPITAL_COMMUNITY): Payer: Self-pay

## 2023-10-05 NOTE — Progress Notes (Signed)
Anesthesia Chart Review:  Case: [id number] Date/Time: 10/10/23 1045   Procedure: IMPLANTATION OF HYPOGLOSSAL NERVE STIMULATOR   Anesthesia type: General   Pre-op diagnosis: Obstructive sleep apnea   [facility]OM 07 / [facility]   Surgeons: Angela Croon, MD       DISCUSSION: Patient is a 63 y.o. female scheduled for the above procedure.   History includes smoking, COPD, asthma, OSA, HTN, hypercholesterolemia, CAD (inferior MI, s/p emergent PTCA/DES x2 RCA 12/10/06; CABG LIMA_LAD, SVG-RI, SVG-PDA 07/16/07; s/p DES x3 SVG-PDA 06/23/19; occluded SVG-PDA with left to right collaterals 712//21), CHF, murmur, clotting disorder, PVD, carotid artery disease (0-49% BICA 2008), exertional dyspnea, hepatic steatosis, GERD, glaucoma, osteoarthritis (right TKA 10/21/12), cholecystectomy (06/29/23), "clotting disorder" (is listed from 2013, reported being told she had "blood clots all over her body" 10/20/12; she denied formal diagnosis of a genetic bleeding or clotting disorder). 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   S/p cholecystectomy 06/29/23 and discharged home. Returned to [facility] and admitted 06/30/23 - 07/03/23 with abdominal pain, fever, chest pain, nausea and vomiting. Her umbilical incision had opened up some requiring closure with Dermabond by general surgery. CT scan showed a hematoma around the liver and pelvis not no extravasation of contrast and not felt to be activity bleeding. WBC 19.3. H/H 9.3/26.8 down from 13.9/41.7 on 06/18/23. Cardiology consulted when HS Troponin I came back at 396 but flat on repeat at 406. Based on presentation and review of her last LHC there were collaterals from left to right and an occluded RCA graft. Demand ischemic suspected rather than loss of graft of ACS. Echo done on 07/01/23 and showed LVEF 50-55%  but wall motion abnormality: Paradoxical septal motion consistent with postop status, basal inferolateral segment and basal inferior segment  hypokinetic; the entire anterior wall, anterolateral wall, mid to distal lateral wall, entire septum, entire apex, and mid and distal inferior wall were normal. Additional testing deferred given recent surgery with bleeding complication and uncertain she would have targets for revascularization. She did not think increased Imdur helped her stable angina, so Ranexa added.   She had office follow-up with Angela Reining, NP on 07/10/23. On isosorbide 30 mg daily and Ranexa 500 mg BID for chronic angina. If progressive symptoms could increase Ranexa to 1000 mg BID. Continue metoprolol, atorvastatin, and Praluent injections. Also on Vascepa. Plavix on hold since post-op bleeding and now with upcoming Inspire. Recommended to resume once fully healed from surgeries. Next follow-up with Dr. Allyson Walsh is scheduled for 10/23/23.  Cardiology preoperative input outlined by Angela Paganini, NP on 08/13/23, "Given past medical history and time since last visit, based on ACC/AHA guidelines, [name] is at acceptable risk for the planned procedure without further cardiovascular testing.    Pt recently seen in the office by Angela Deist, NP on 07/10/2023 who agrees she is stable from a cardiac standpoint."   She reported stable exertional angina symptoms as stable since last cardiology evaluation. She reported instructions to hold ASA until after surgery. She said "clotting disorder" was reported more due to CAD history (atherosclerosis in coronaries, carotid). She denied any genetic/familial bleeding or clotting disorder diagnosis. She had bleeding after cholecystectomy, but denied having other significant post-operative bleeding. She confirmed Plavix is on hold until after she recovers from this surgery.    Anesthesia team to evaluate on the day of surgery. H/H 14.6/43.9, PLT 337 on 10/04/23. She is posted as out-patient in bed--she told me that potential for overnight admission was  discussed due to cardiac history and  bleeding after cholecystectomy. She desired overnight admission and sent communication to surgeon.    VS: BP 138/85   Pulse (!) 59   Temp 36.6 C   Resp 17   Ht 5' 5.5" (1.664 m)   Wt 87.8 kg   LMP 11/16/2011   SpO2 99%   BMI 31.71 kg/m    PROVIDERS: Angela Mis, MD is PCP  Angela Batty, MD is cardiologist   LABS: Labs reviewed: Acceptable for surgery. (all labs ordered are listed, but only abnormal results are displayed)  Labs Reviewed  BASIC METABOLIC PANEL - Abnormal; Notable for the following components:      Result Value   Glucose, Bld 114 (*)    All other components within normal limits  CBC - Abnormal; Notable for the following components:   RDW 16.0 (*)    All other components within normal limits  SURGICAL PCR SCREEN     IMAGES: 1V PCXR 06/30/23: FINDINGS: The heart size and mediastinal contours are within normal limits. Status post coronary artery bypass graft. Both lungs are clear. The visualized skeletal structures are unremarkable. IMPRESSION: No active disease.   EKG: 07/06/23: Normal sinus rhythm Moderate voltage criteria for LVH, may be normal variant ( R in aVL , Cornell product ) Inferior-posterior infarct (cited on or before 01-Jul-2023) Cannot rule out Anterior infarct , age undetermined Abnormal ECG   CV: Echo 07/01/2023: MPRESSIONS   1. Left ventricular ejection fraction, by estimation, is 50 to 55%. The  left ventricle has low normal function. The left ventricle demonstrates  regional wall motion abnormalities (see scoring diagram/findings for  description). Left ventricular diastolic   parameters were normal. Abnormal (paradoxical)  septal motion consistent with post-operative status.  LV Wall Scoring:  The basal inferolateral segment and basal inferior segment are  hypokinetic.  The entire anterior wall, antero-lateral wall, mid and distal lateral  wall,  entire septum, entire apex, and mid and distal inferior wall are  normal.   2. Right ventricular systolic function is normal. The right ventricular  size is normal. Tricuspid regurgitation signal is inadequate for assessing  PA pressure.   3. Left atrial size was moderately dilated.   4. The mitral valve is normal in structure. No evidence of mitral valve  regurgitation. No evidence of mitral stenosis.   5. The aortic valve is tricuspid. Aortic valve regurgitation is not  visualized. Aortic valve sclerosis is present, with no evidence of aortic  valve stenosis.   6. The inferior vena cava is normal in size with greater than 50%  respiratory variability, suggesting right atrial pressure of 3 mmHg.  - Comparison(s): Prior images reviewed side by side. Direct comparison shows  a similar wall motion abnormality on the 2021 study.    Myocardial Perfusion 01/06/2022:   No ST deviation was noted.   Left ventricular function is normal. Nuclear stress EF: 62 %. The left ventricular ejection fraction is normal (55-65%). End diastolic cavity size is normal. End systolic cavity size is normal.   Prior study available for comparison from 04/18/2011.   Small inferior basal wall infarct with no ischemia EF preserved 62%    Cardiac cath 03/01/2020: Patent left internal mammary graft to the LAD. Diffusely diseased but patent saphenous vein graft to the ramus intermedius. Total occlusion of the mid and distal segments of the saphenous vein graft to the PDA.  PDA and other left ventricular branches of the right coronary regular supplied by left to  right collaterals. Patent but diffusely diseased native left main. Functional occlusion of the left anterior descending after the origin of the first diagonal.  The segment from the ostial LAD into the diagonal is diffusely diseased up to 60 to 70%. Patent circumflex artery that includes a single obtuse marginal.  Ostial to mid vessel luminal irregularities with eccentric stenoses up to 60 to 70%.  Unchanged from prior  imaging. Inferobasal akinesis.  EF 40%.  LVEDP 14 mmHg on IV nitroglycerin   RECOMMENDATIONS:  Continue aggressive risk factor modification.   US Carotid 08/21/2007: Summary: Right and left ICAs: 0 to 49% diameter reduction.   Past Medical History:  Diagnosis Date   Allergy    Anemia    takes iron supplement   Arthritis    knees, shoulders   Asthma    Bipolar 1 disorder (HCC)    Blood transfusion without reported diagnosis    Cataract, immature    CHF (congestive heart failure) (HCC)    Clotting disorder (HCC)    she denied any specific genetic bleeding/clotting diagnosis (10/05/23)   COPD (chronic obstructive pulmonary disease) (HCC)    Coronary artery disease    triple vessel   Depression    Dyspnea    Family history of adverse reaction to anesthesia    states father "did not do well" with anesthesia; died last time he had surgery; also had heart disease   Gallstones    GERD (gastroesophageal reflux disease)    occasional - no current med.   Glaucoma    Hammertoe 05/2015   right second toe   Heart murmur    Hepatic steatosis    High cholesterol    History of MI (myocardial infarction)    Hypertension    states under control with meds., has been on med. x 8 yr.   Myocardial infarction (HCC)    Osteoporosis    Panic attacks    Peripheral vascular disease (HCC)    carotid   Renal cyst, left    Schizophrenia (HCC)    Short of breath on exertion    Sleep apnea    Inspire in future   Urinary, incontinence, stress female    Wears dentures    upper   Wears partial dentures    lower    Past Surgical History:  Procedure Laterality Date   C-SECTIONS X 2  1985, 1988   CARDIAC CATHETERIZATION  12/10/2006; 12/18/2006; 07/10/2007; 07/19/2009   CARDIAC CATHETERIZATION  03/04/2010   patent grafts, normal LV function, diffusely diseased RCA (Dr. Erlene Quan)   CARDIAC CATHETERIZATION N/A 02/15/2016   Procedure: Left Heart Cath and Cors/Grafts Angiography;  Surgeon:  Peter M Swaziland, MD;  Location: [facility] INVASIVE CV LAB;  Service: Cardiovascular;  Laterality: N/A;   CHOLECYSTECTOMY N/A 06/29/2023   Procedure: LAPAROSCOPIC CHOLECYSTECTOMY WITH ICG DYE;  Surgeon: Gaynelle Adu, MD;  Location: [facility] ORS;  Service: General;  Laterality: N/A;   CLUB FOOT RELEASE Bilateral    1st and 2nd toes   COLONOSCOPY     CORONARY ARTERY BYPASS GRAFT  07/16/2007   LIMA to LAD, VG to ramus, VG to PDA    CORONARY STENT INTERVENTION N/A 06/23/2019   Procedure: CORONARY STENT INTERVENTION;  Surgeon: Runell Gess, MD;  Location: [facility] INVASIVE CV LAB;  Service: Cardiovascular;  Laterality: N/A;   DRUG INDUCED ENDOSCOPY N/A 05/17/2023   Procedure: DRUG INDUCED ENDOSCOPY;  Surgeon: Oretha Milch, MD;  Location: [facility] ENDOSCOPY;  Service: Pulmonary;  Laterality: N/A;   FLEXIBLE  BRONCHOSCOPY  07/16/2007   HAMMERTOE RECONSTRUCTION WITH WEIL OSTEOTOMY Right 06/03/2015   Procedure: RIGHT SECOND HAMMERTOE CORRECTION WITH MT WEIL OSTEOTOMY;  Surgeon: Toni Arthurs, MD;  Location: [facility];  Service: Orthopedics;  Laterality: Right;   HAND SURGERY Bilateral    1st and 2nd fingers - release of clubbing   KNEE ARTHROSCOPY WITH ANTERIOR CRUCIATE LIGAMENT (ACL) REPAIR Right 01/30/2008   LEFT HEART CATH AND CORS/GRAFTS ANGIOGRAPHY N/A 06/23/2019   Procedure: LEFT HEART CATH AND CORS/GRAFTS ANGIOGRAPHY;  Surgeon: Runell Gess, MD;  [facility]: [facility]VAS[facility] CV LAB;  Service: Cardiovascular;  Laterality: N/A;   LEFT HEART CATH AND CORS/GRAFTS ANGIOGRAPHY N/A 03/01/2020   Procedure: LEFT HEART CATH AND CORS/GRAFTS ANGIOGRAPHY;  Surgeon: Lyn Records, MD;  Location: [facility] INVASIVE CV LAB;  Service: Cardiovascular;  Laterality: N/A;   NOVASURE ABLATION  12/19/2007   with removal of IUD   ORIF FOOT FRACTURE Left    TOE SURGERIES TO REMOVE NAILS DUE TO CLUBBING     TOTAL KNEE ARTHROPLASTY Right 10/21/2012   Procedure: TOTAL KNEE ARTHROPLASTY;  Surgeon: Shelda Pal, MD;  Location: [facility] ORS;   Service: Orthopedics;  Laterality: Right;   UPPER GASTROINTESTINAL ENDOSCOPY      MEDICATIONS:  albuterol (PROVENTIL HFA;VENTOLIN HFA) 108 (90 BASE) MCG/ACT inhaler   Alirocumab (PRALUENT) 150 MG/ML SOAJ   amLODipine (NORVASC) 5 MG tablet   Ascorbic Acid (VITAMIN C PO)   aspirin 81 MG chewable tablet   atorvastatin (LIPITOR) 80 MG tablet   brimonidine (ALPHAGAN) 0.15 % ophthalmic solution   Budeson-Glycopyrrol-Formoterol (BREZTRI AEROSPHERE) 160-9-4.8 MCG/ACT AERO   escitalopram (LEXAPRO) 20 MG tablet   fluticasone (FLONASE) 50 MCG/ACT nasal spray   icosapent Ethyl (VASCEPA) 1 g capsule   isosorbide mononitrate (IMDUR) 30 MG 24 hr tablet   lamoTRIgine (LAMICTAL) 100 MG tablet   metoprolol tartrate (LOPRESSOR) 25 MG tablet   niacin (NIASPAN) 1000 MG CR tablet   nitroGLYCERIN (NITROSTAT) 0.4 MG SL tablet   ondansetron (ZOFRAN-ODT) 4 MG disintegrating tablet   pantoprazole (PROTONIX) 40 MG tablet   QUEtiapine Fumarate 150 MG TABS   ranolazine (RANEXA) 500 MG 12 hr tablet   VITAMIN E PO   No current facility-administered medications for this encounter.     Shonna Chock, PA-C Surgical Short Stay/Anesthesiology [facility] Phone [phone] [facility] Phone [phone] 10/05/2023 2:31 PM

## 2023-10-05 NOTE — Anesthesia Preprocedure Evaluation (Addendum)
Anesthesia Evaluation  Patient identified by MRN, date of birth, ID band Patient awake    Reviewed: Allergy & Precautions, NPO status , Patient's Chart, lab work & pertinent test results  Airway Mallampati: III  TM Distance: >3 FB Neck ROM: Full    Dental  (+) Edentulous Upper, Missing, Dental Advisory Given,    Pulmonary shortness of breath, asthma , sleep apnea , COPD, Current Smoker and Patient abstained from smoking.   breath sounds clear to auscultation       Cardiovascular hypertension, Pt. on medications and Pt. on home beta blockers (-) angina + CAD, + Past MI, + CABG, + Peripheral Vascular Disease and +CHF   Rhythm:Regular   1. Left ventricular ejection fraction, by estimation, is 50 to 55%. The  left ventricle has low normal function. The left ventricle demonstrates  regional wall motion abnormalities (see scoring diagram/findings for  description). Left ventricular diastolic   parameters were normal.   2. Right ventricular systolic function is normal. The right ventricular  size is normal. Tricuspid regurgitation signal is inadequate for assessing  PA pressure.   3. Left atrial size was moderately dilated.   4. The mitral valve is normal in structure. No evidence of mitral valve  regurgitation. No evidence of mitral stenosis.   5. The aortic valve is tricuspid. Aortic valve regurgitation is not  visualized. Aortic valve sclerosis is present, with no evidence of aortic  valve stenosis.   6. The inferior vena cava is normal in size with greater than 50%  respiratory variability, suggesting right atrial pressure of 3 mmHg.    2021 cath:   Patent left internal mammary graft to the LAD.  Diffusely diseased but patent saphenous vein graft to the ramus intermedius.  Total occlusion of the mid and distal segments of the saphenous vein graft to the PDA.  PDA and other left ventricular branches of the right coronary regular  supplied by left to right collaterals.  Patent but diffusely diseased native left main.  Functional occlusion of the left anterior descending after the origin of the first diagonal.  The segment from the ostial LAD into the diagonal is diffusely diseased up to 60 to 70%.  Patent circumflex artery that includes a single obtuse marginal.  Ostial to mid vessel luminal irregularities with eccentric stenoses up to 60 to 70%.  Unchanged from prior imaging.  Inferobasal akinesis.  EF 40%.  LVEDP 14 mmHg on IV nitroglycerin   RECOMMENDATIONS:    Continue aggressive risk factor modification.  Add long-acting nitrates to improve collateral flow.  IV nitroglycerin is discontinued.  Subcutaneous heparin DVT prophylaxis.  Continue dual antiplatelet therapy but consider clopidogrel monotherapy after 3 months.   Neg stress 2023   Neuro/Psych  PSYCHIATRIC DISORDERS         GI/Hepatic ,GERD  Controlled,,  Endo/Other  negative endocrine ROS    Renal/GU Renal diseaseLab Results      Component                Value               Date                      NA                       140                 10/04/2023  K                        3.9                 10/04/2023                CO2                      24                  10/04/2023                GLUCOSE                  114 (H)             10/04/2023                BUN                      12                  10/04/2023                CREATININE               0.97                10/04/2023                CALCIUM                  9.2                 10/04/2023                GFRNONAA                 >60                 10/04/2023                Musculoskeletal  (+) Arthritis ,    Abdominal   Peds  Hematology negative hematology ROS (+) Lab Results      Component                Value               Date                      WBC                      10.2                10/04/2023                HGB                       14.6                10/04/2023                HCT                      43.9                10/04/2023  MCV                      93.8                10/04/2023                PLT                      337                 10/04/2023              Anesthesia Other Findings   Reproductive/Obstetrics                              Anesthesia Physical Anesthesia Plan  ASA: 3  Anesthesia Plan: General   Post-op Pain Management: Ofirmev IV (intra-op)*   Induction: Intravenous  PONV Risk Score and Plan: 2 and Ondansetron and Dexamethasone  Airway Management Planned: Oral ETT  Additional Equipment: None  Intra-op Plan:   Post-operative Plan: Extubation in OR  Informed Consent: I have reviewed the patients History and Physical, chart, labs and discussed the procedure including the risks, benefits and alternatives for the proposed anesthesia with the patient or authorized representative who has indicated his/her understanding and acceptance.     Dental advisory given  Plan Discussed with: CRNA  Anesthesia Plan Comments: (See PAT note written 10/05/2023 by Shonna Chock, PA-C.  )        Anesthesia Quick Evaluation

## 2023-10-10 ENCOUNTER — Ambulatory Visit (HOSPITAL_COMMUNITY)
Admission: RE | Admit: 2023-10-10 | Discharge: 2023-10-10 | Disposition: A | Discharge status: Home / Self Care | Payer: Medicare Other | Attending: Otolaryngology | Admitting: Otolaryngology

## 2023-10-10 ENCOUNTER — Other Ambulatory Visit: Payer: Self-pay

## 2023-10-10 ENCOUNTER — Ambulatory Visit (HOSPITAL_BASED_OUTPATIENT_CLINIC_OR_DEPARTMENT_OTHER): Payer: Medicare Other

## 2023-10-10 ENCOUNTER — Encounter (HOSPITAL_COMMUNITY)
Admission: RE | Disposition: A | Discharge status: Home / Self Care | Payer: Self-pay | Source: Home / Self Care | Attending: Otolaryngology

## 2023-10-10 ENCOUNTER — Encounter (HOSPITAL_COMMUNITY): Payer: Self-pay | Admitting: *Deleted

## 2023-10-10 ENCOUNTER — Other Ambulatory Visit (INDEPENDENT_AMBULATORY_CARE_PROVIDER_SITE_OTHER): Payer: Self-pay | Admitting: Otolaryngology

## 2023-10-10 ENCOUNTER — Ambulatory Visit (HOSPITAL_COMMUNITY): Payer: Medicare Other | Admitting: Vascular Surgery

## 2023-10-10 ENCOUNTER — Ambulatory Visit (HOSPITAL_COMMUNITY): Payer: Medicare Other

## 2023-10-10 DIAGNOSIS — I739 Peripheral vascular disease, unspecified: Secondary | ICD-10-CM | POA: Insufficient documentation

## 2023-10-10 DIAGNOSIS — I119 Hypertensive heart disease without heart failure: Secondary | ICD-10-CM | POA: Insufficient documentation

## 2023-10-10 DIAGNOSIS — I2510 Atherosclerotic heart disease of native coronary artery without angina pectoris: Secondary | ICD-10-CM

## 2023-10-10 DIAGNOSIS — I252 Old myocardial infarction: Secondary | ICD-10-CM | POA: Insufficient documentation

## 2023-10-10 DIAGNOSIS — E7800 Pure hypercholesterolemia, unspecified: Secondary | ICD-10-CM | POA: Insufficient documentation

## 2023-10-10 DIAGNOSIS — Z91198 Patient's noncompliance with other medical treatment and regimen for other reason: Secondary | ICD-10-CM | POA: Diagnosis not present

## 2023-10-10 DIAGNOSIS — Z716 Tobacco abuse counseling: Secondary | ICD-10-CM | POA: Insufficient documentation

## 2023-10-10 DIAGNOSIS — I2489 Other forms of acute ischemic heart disease: Secondary | ICD-10-CM | POA: Diagnosis not present

## 2023-10-10 DIAGNOSIS — Z79899 Other long term (current) drug therapy: Secondary | ICD-10-CM | POA: Diagnosis not present

## 2023-10-10 DIAGNOSIS — F17210 Nicotine dependence, cigarettes, uncomplicated: Secondary | ICD-10-CM

## 2023-10-10 DIAGNOSIS — I509 Heart failure, unspecified: Secondary | ICD-10-CM

## 2023-10-10 DIAGNOSIS — K219 Gastro-esophageal reflux disease without esophagitis: Secondary | ICD-10-CM | POA: Diagnosis not present

## 2023-10-10 DIAGNOSIS — R0981 Nasal congestion: Secondary | ICD-10-CM | POA: Insufficient documentation

## 2023-10-10 DIAGNOSIS — I110 Hypertensive heart disease with heart failure: Secondary | ICD-10-CM

## 2023-10-10 DIAGNOSIS — Z6831 Body mass index (BMI) 31.0-31.9, adult: Secondary | ICD-10-CM | POA: Insufficient documentation

## 2023-10-10 DIAGNOSIS — J4489 Other specified chronic obstructive pulmonary disease: Secondary | ICD-10-CM | POA: Insufficient documentation

## 2023-10-10 DIAGNOSIS — Z8674 Personal history of sudden cardiac arrest: Secondary | ICD-10-CM | POA: Diagnosis not present

## 2023-10-10 DIAGNOSIS — Z951 Presence of aortocoronary bypass graft: Secondary | ICD-10-CM | POA: Insufficient documentation

## 2023-10-10 DIAGNOSIS — E66812 Obesity, class 2: Secondary | ICD-10-CM | POA: Diagnosis not present

## 2023-10-10 DIAGNOSIS — Z955 Presence of coronary angioplasty implant and graft: Secondary | ICD-10-CM | POA: Insufficient documentation

## 2023-10-10 DIAGNOSIS — Z789 Other specified health status: Secondary | ICD-10-CM

## 2023-10-10 DIAGNOSIS — G4733 Obstructive sleep apnea (adult) (pediatric): Secondary | ICD-10-CM | POA: Insufficient documentation

## 2023-10-10 HISTORY — PX: IMPLANTATION OF HYPOGLOSSAL NERVE STIMULATOR: SHX6827

## 2023-10-10 SURGERY — INSERTION, HYPOGLOSSAL NERVE STIMULATOR
Anesthesia: General | Site: Neck

## 2023-10-10 MED ORDER — FENTANYL CITRATE (PF) 250 MCG/5ML IJ SOLN
INTRAMUSCULAR | Status: DC | PRN
  Administered 2023-10-10: 100 ug via INTRAVENOUS
  Administered 2023-10-10: 50 ug via INTRAVENOUS

## 2023-10-10 MED ORDER — LIDOCAINE 2% (20 MG/ML) 5 ML SYRINGE
INTRAMUSCULAR | Status: DC | PRN
  Administered 2023-10-10: 80 mg via INTRAVENOUS

## 2023-10-10 MED ORDER — MIDAZOLAM HCL 2 MG/2ML IJ SOLN
INTRAMUSCULAR | Status: DC | PRN
Start: 2023-10-10 — End: 2023-10-10
  Administered 2023-10-10: 2 mg via INTRAVENOUS

## 2023-10-10 MED ORDER — GLYCOPYRROLATE PF 0.2 MG/ML IJ SOSY
PREFILLED_SYRINGE | INTRAMUSCULAR | Status: AC
  Filled 2023-10-10: qty 1

## 2023-10-10 MED ORDER — DEXAMETHASONE SODIUM PHOSPHATE 10 MG/ML IJ SOLN
INTRAMUSCULAR | Status: DC | PRN
  Administered 2023-10-10: 10 mg via INTRAVENOUS

## 2023-10-10 MED ORDER — GLYCOPYRROLATE 0.2 MG/ML IJ SOLN
INTRAMUSCULAR | Status: DC | PRN
  Administered 2023-10-10: .2 mg via INTRAVENOUS

## 2023-10-10 MED ORDER — SUCCINYLCHOLINE CHLORIDE 200 MG/10ML IV SOSY
PREFILLED_SYRINGE | INTRAVENOUS | Status: DC | PRN
Start: 2023-10-10 — End: 2023-10-10
  Administered 2023-10-10: 160 mg via INTRAVENOUS

## 2023-10-10 MED ORDER — SODIUM CHLORIDE 0.9 % IV SOLN
0.1500 ug/kg/min | INTRAVENOUS | Status: AC
  Administered 2023-10-10: .15 ug/kg/min via INTRAVENOUS
  Filled 2023-10-10: qty 2000

## 2023-10-10 MED ORDER — OXYCODONE HCL 5 MG PO TABS
5.0000 mg | ORAL_TABLET | Freq: Once | ORAL | Status: AC | PRN
  Administered 2023-10-10: 5 mg via ORAL

## 2023-10-10 MED ORDER — LIDOCAINE-EPINEPHRINE 1 %-1:100000 IJ SOLN
INTRAMUSCULAR | Status: DC | PRN
  Administered 2023-10-10: 9 mL

## 2023-10-10 MED ORDER — OXYCODONE HCL 5 MG/5ML PO SOLN: 5.0000 mg | Freq: Once | ORAL | Status: AC | PRN

## 2023-10-10 MED ORDER — FENTANYL CITRATE (PF) 100 MCG/2ML IJ SOLN: 25.0000 ug | INTRAMUSCULAR | Status: DC | PRN

## 2023-10-10 MED ORDER — PHENYLEPHRINE HCL-NACL 20-0.9 MG/250ML-% IV SOLN
INTRAVENOUS | Status: DC | PRN
  Administered 2023-10-10: 25 ug/min via INTRAVENOUS

## 2023-10-10 MED ORDER — ORAL CARE MOUTH RINSE: 15.0000 mL | Freq: Once | OROMUCOSAL | Status: AC

## 2023-10-10 MED ORDER — SUCCINYLCHOLINE CHLORIDE 200 MG/10ML IV SOSY
PREFILLED_SYRINGE | INTRAVENOUS | Status: AC
  Filled 2023-10-10: qty 10

## 2023-10-10 MED ORDER — DEXAMETHASONE SODIUM PHOSPHATE 10 MG/ML IJ SOLN
INTRAMUSCULAR | Status: AC
  Filled 2023-10-10: qty 1

## 2023-10-10 MED ORDER — DEXMEDETOMIDINE HCL IN NACL 80 MCG/20ML IV SOLN
INTRAVENOUS | Status: DC | PRN
  Administered 2023-10-10: 4 ug via INTRAVENOUS

## 2023-10-10 MED ORDER — ACETAMINOPHEN 500 MG PO TABS: 1000.0000 mg | ORAL_TABLET | Freq: Once | ORAL | Status: DC | PRN

## 2023-10-10 MED ORDER — CLINDAMYCIN PHOSPHATE 900 MG/50ML IV SOLN
900.0000 mg | INTRAVENOUS | Status: AC
  Administered 2023-10-10: 900 mg via INTRAVENOUS
  Filled 2023-10-10: qty 50

## 2023-10-10 MED ORDER — LACTATED RINGERS IV SOLN
INTRAVENOUS | Status: DC
Start: 2023-10-10 — End: 2023-10-10

## 2023-10-10 MED ORDER — ACETAMINOPHEN 500 MG PO TABS: 500.0000 mg | ORAL_TABLET | Freq: Four times a day (QID) | ORAL | 1 refills | Status: DC | PRN

## 2023-10-10 MED ORDER — OXYCODONE HCL 5 MG PO TABS: 5.0000 mg | ORAL_TABLET | Freq: Four times a day (QID) | ORAL | 0 refills | Status: DC | PRN

## 2023-10-10 MED ORDER — OXYCODONE HCL 5 MG PO TABS
ORAL_TABLET | ORAL | Status: AC
  Filled 2023-10-10: qty 1

## 2023-10-10 MED ORDER — ALBUTEROL SULFATE HFA 108 (90 BASE) MCG/ACT IN AERS
INHALATION_SPRAY | RESPIRATORY_TRACT | Status: DC | PRN
Start: 2023-10-10 — End: 2023-10-10
  Administered 2023-10-10: 2 via RESPIRATORY_TRACT

## 2023-10-10 MED ORDER — NOREPINEPHRINE 4 MG/250ML-% IV SOLN
INTRAVENOUS | Status: DC | PRN
  Administered 2023-10-10: 2 ug/min via INTRAVENOUS

## 2023-10-10 MED ORDER — EPHEDRINE SULFATE-NACL 50-0.9 MG/10ML-% IV SOSY
PREFILLED_SYRINGE | INTRAVENOUS | Status: DC | PRN
  Administered 2023-10-10 (×2): 5 mg via INTRAVENOUS
  Administered 2023-10-10: 15 mg via INTRAVENOUS

## 2023-10-10 MED ORDER — PHENYLEPHRINE 80 MCG/ML (10ML) SYRINGE FOR IV PUSH (FOR BLOOD PRESSURE SUPPORT)
PREFILLED_SYRINGE | INTRAVENOUS | Status: DC | PRN
  Administered 2023-10-10: 80 ug via INTRAVENOUS
  Administered 2023-10-10: 160 ug via INTRAVENOUS
  Administered 2023-10-10: 80 ug via INTRAVENOUS
  Administered 2023-10-10: 160 ug via INTRAVENOUS

## 2023-10-10 MED ORDER — IBUPROFEN 600 MG PO TABS: 600.0000 mg | ORAL_TABLET | Freq: Four times a day (QID) | ORAL | 0 refills | Status: DC

## 2023-10-10 MED ORDER — ACETAMINOPHEN 160 MG/5ML PO SOLN: 1000.0000 mg | Freq: Once | ORAL | Status: DC | PRN

## 2023-10-10 MED ORDER — ALBUTEROL SULFATE HFA 108 (90 BASE) MCG/ACT IN AERS
INHALATION_SPRAY | RESPIRATORY_TRACT | Status: AC
  Filled 2023-10-10: qty 6.7

## 2023-10-10 MED ORDER — LIDOCAINE 2% (20 MG/ML) 5 ML SYRINGE
INTRAMUSCULAR | Status: AC
  Filled 2023-10-10: qty 5

## 2023-10-10 MED ORDER — ONDANSETRON HCL 4 MG/2ML IJ SOLN
INTRAMUSCULAR | Status: DC | PRN
  Administered 2023-10-10: 4 mg via INTRAVENOUS

## 2023-10-10 MED ORDER — CHLORHEXIDINE GLUCONATE 0.12 % MT SOLN
15.0000 mL | Freq: Once | OROMUCOSAL | Status: AC
  Administered 2023-10-10: 15 mL via OROMUCOSAL
  Filled 2023-10-10: qty 15

## 2023-10-10 MED ORDER — ONDANSETRON HCL 4 MG/2ML IJ SOLN
INTRAMUSCULAR | Status: AC
  Filled 2023-10-10: qty 2

## 2023-10-10 MED ORDER — PROPOFOL 10 MG/ML IV BOLUS
INTRAVENOUS | Status: AC
  Filled 2023-10-10: qty 20

## 2023-10-10 MED ORDER — LIDOCAINE-EPINEPHRINE 1 %-1:100000 IJ SOLN
INTRAMUSCULAR | Status: AC
  Filled 2023-10-10: qty 1

## 2023-10-10 MED ORDER — VASOPRESSIN 20 UNIT/ML IV SOLN
INTRAVENOUS | Status: AC
  Filled 2023-10-10: qty 1

## 2023-10-10 MED ORDER — VASOPRESSIN 20 UNIT/ML IV SOLN
INTRAVENOUS | Status: DC | PRN
  Administered 2023-10-10 (×4): 3 [IU] via INTRAVENOUS
  Administered 2023-10-10: 2 [IU] via INTRAVENOUS

## 2023-10-10 MED ORDER — ALBUMIN HUMAN 5 % IV SOLN: INTRAVENOUS | Status: DC | PRN

## 2023-10-10 MED ORDER — ACETAMINOPHEN 10 MG/ML IV SOLN: 1000.0000 mg | Freq: Once | INTRAVENOUS | Status: DC | PRN

## 2023-10-10 MED ORDER — MIDAZOLAM HCL 2 MG/2ML IJ SOLN
INTRAMUSCULAR | Status: AC
  Filled 2023-10-10: qty 2

## 2023-10-10 MED ORDER — PROPOFOL 10 MG/ML IV BOLUS
INTRAVENOUS | Status: DC | PRN
  Administered 2023-10-10: 90 mg via INTRAVENOUS

## 2023-10-10 MED ORDER — 0.9 % SODIUM CHLORIDE (POUR BTL) OPTIME
TOPICAL | Status: DC | PRN
  Administered 2023-10-10: 1000 mL

## 2023-10-10 MED ORDER — EPHEDRINE 5 MG/ML INJ
INTRAVENOUS | Status: AC
  Filled 2023-10-10: qty 5

## 2023-10-10 MED ORDER — DOXYCYCLINE HYCLATE 100 MG PO TABS: 100.0000 mg | ORAL_TABLET | Freq: Two times a day (BID) | ORAL | 0 refills | Status: DC

## 2023-10-10 MED ORDER — FENTANYL CITRATE (PF) 250 MCG/5ML IJ SOLN
INTRAMUSCULAR | Status: AC
  Filled 2023-10-10: qty 5

## 2023-10-10 MED ORDER — BACITRACIN ZINC 500 UNIT/GM EX OINT
TOPICAL_OINTMENT | CUTANEOUS | Status: AC
  Filled 2023-10-10: qty 28.35

## 2023-10-10 SURGICAL SUPPLY — 57 items
BLADE SURG 15 STRL LF DISP TIS (BLADE) ×3 IMPLANT
CANISTER SUCT 3000ML PPV (MISCELLANEOUS) ×1 IMPLANT
CORD BIPOLAR FORCEPS 12FT (ELECTRODE) ×1 IMPLANT
COVER PROBE W GEL 5X96 (DRAPES) ×1 IMPLANT
COVER SURGICAL LIGHT HANDLE (MISCELLANEOUS) ×1 IMPLANT
DERMABOND ADVANCED .7 DNX12 (GAUZE/BANDAGES/DRESSINGS) ×2 IMPLANT
DRAPE C-ARM 35X43 STRL (DRAPES) ×1 IMPLANT
DRAPE INCISE IOBAN 66X45 STRL (DRAPES) ×1 IMPLANT
DRAPE MICROSCOPE LEICA 54X105 (DRAPES) ×1 IMPLANT
DRSG TEGADERM 4X4.75 (GAUZE/BANDAGES/DRESSINGS) ×3 IMPLANT
ELECT COATED BLADE 2.86 ST (ELECTRODE) ×1 IMPLANT
ELECT EMG 18 NIMS (NEUROSURGERY SUPPLIES) ×1 IMPLANT
ELECT REM PT RETURN 9FT ADLT (ELECTROSURGICAL) ×1 IMPLANT
ELECTRODE EMG 18 NIMS (NEUROSURGERY SUPPLIES) ×1 IMPLANT
ELECTRODE REM PT RTRN 9FT ADLT (ELECTROSURGICAL) ×1 IMPLANT
GAUZE 4X4 16PLY ~~LOC~~+RFID DBL (SPONGE) ×1 IMPLANT
GAUZE SPONGE 4X4 12PLY STRL (GAUZE/BANDAGES/DRESSINGS) ×1 IMPLANT
GENERATOR PULSE INSPIRE (Generator) ×1 IMPLANT
GENERATOR PULSE INSPIRE IV (Generator) ×1 IMPLANT
GLOVE BIO SURGEON STRL SZ 6 (GLOVE) ×1 IMPLANT
GLOVE BIO SURGEON STRL SZ7.5 (GLOVE) ×1 IMPLANT
GOWN STRL REUS W/ TWL LRG LVL3 (GOWN DISPOSABLE) ×3 IMPLANT
KIT BASIN OR (CUSTOM PROCEDURE TRAY) ×1 IMPLANT
KIT NEURO ACCESSORY W/WRENCH (MISCELLANEOUS) IMPLANT
KIT TURNOVER KIT B (KITS) ×1 IMPLANT
LEAD SENSING RESP INSPIRE (Lead) ×1 IMPLANT
LEAD SENSING RESP INSPIRE IV (Lead) ×1 IMPLANT
LEAD SLEEP STIM INSPIRE IV/V (Lead) ×1 IMPLANT
LEAD SLEEP STIMULATION INSPIRE (Lead) ×1 IMPLANT
LOOP VASCLR MAXI BLUE 18IN ST (MISCELLANEOUS) ×1 IMPLANT
LOOPS VASCLR MAXI BLUE 18IN ST (MISCELLANEOUS) ×1 IMPLANT
MARKER SKIN DUAL TIP RULER LAB (MISCELLANEOUS) ×2 IMPLANT
NDL HYPO 25GX1X1/2 BEV (NEEDLE) ×1 IMPLANT
NEEDLE HYPO 25GX1X1/2 BEV (NEEDLE) ×1 IMPLANT
NS IRRIG 1000ML POUR BTL (IV SOLUTION) ×1 IMPLANT
PAD ARMBOARD 7.5X6 YLW CONV (MISCELLANEOUS) ×1 IMPLANT
PASSER CATH 38CM DISP (INSTRUMENTS) ×1 IMPLANT
POSITIONER HEAD DONUT 9IN (MISCELLANEOUS) ×1 IMPLANT
PROBE NERVE STIMULATOR (NEUROSURGERY SUPPLIES) ×1 IMPLANT
REMOTE CONTROL SLEEP INSPIRE (MISCELLANEOUS) ×1 IMPLANT
SET WALTER ACTIVATION W/DRAPE (SET/KITS/TRAYS/PACK) IMPLANT
SHEARS HARMONIC 9CM CVD (BLADE) ×1 IMPLANT
SPONGE INTESTINAL PEANUT (DISPOSABLE) ×3 IMPLANT
SUT MNCRL AB 4-0 PS2 18 (SUTURE) ×2 IMPLANT
SUT SILK 2 0 TIES 10X30 (SUTURE) IMPLANT
SUT SILK 3 0 TIES 10X30 (SUTURE) IMPLANT
SUT SILK 3-0 RB1 30XBRD (SUTURE) ×1 IMPLANT
SUT SILK PERMA 2-0 1X30 RB-1 (SUTURE) ×2 IMPLANT
SUT VIC AB 3-0 SH 27X BRD (SUTURE) ×2 IMPLANT
SUTURE SILK 3-0 RB1 30XBRD (SUTURE) ×2 IMPLANT
SUTURE SILK PEM 2-0 1X30 RB-1 (SUTURE) ×4 IMPLANT
SYR 10ML LL (SYRINGE) ×1 IMPLANT
TAPE CLOTH SURG 4X10 WHT LF (GAUZE/BANDAGES/DRESSINGS) ×1 IMPLANT
TIE VASCULAR MAXI BLUE 18IN ST (MISCELLANEOUS) ×1 IMPLANT
TOWEL GREEN STERILE (TOWEL DISPOSABLE) ×1 IMPLANT
TRAY ENT MC OR (CUSTOM PROCEDURE TRAY) ×1 IMPLANT
VASCULAR TIE MINI RED 18IN STL (MISCELLANEOUS) ×1 IMPLANT

## 2023-10-10 NOTE — Interval H&P Note (Signed)
History and Physical Interval Note:  10/10/2023 10:04 AM  [name]  has presented today for surgery, with the diagnosis of Obstructive sleep apnea.  The various methods of treatment have been discussed with the patient and family. After consideration of risks, benefits and other options for treatment, the patient has consented to  Procedure(s): IMPLANTATION OF HYPOGLOSSAL NERVE STIMULATOR (N/A) as a surgical intervention.  The patient's history has been reviewed, patient examined, no change in status, stable for surgery.  I have reviewed the patient's chart and labs.  Questions were answered to the patient's satisfaction.     Ashok Croon

## 2023-10-10 NOTE — Anesthesia Procedure Notes (Signed)
Procedure Name: Intubation Date/Time: 10/10/2023 11:00 AM  Performed by: Eulah Pont, CRNAPre-anesthesia Checklist: Patient identified, Emergency Drugs available, Suction available and Patient being monitored Patient Re-evaluated:Patient Re-evaluated prior to induction Oxygen Delivery Method: Circle System Utilized Preoxygenation: Pre-oxygenation with 100% oxygen Induction Type: IV induction Ventilation: Mask ventilation without difficulty Laryngoscope Size: Miller and 2 Grade View: Grade II Tube type: Oral Tube size: 7.0 mm Number of attempts: 1 Airway Equipment and Method: Stylet, Oral airway and LTA kit utilized Placement Confirmation: ETT inserted through vocal cords under direct vision, positive ETCO2 and breath sounds checked- equal and bilateral Secured at: 21 cm Tube secured with: Tape Dental Injury: Teeth and Oropharynx as per pre-operative assessment

## 2023-10-10 NOTE — Progress Notes (Signed)
For post op.Marland Kitchen

## 2023-10-10 NOTE — Transfer of Care (Signed)
Immediate Anesthesia Transfer of Care Note  Patient: [name]  Procedure(s) Performed: IMPLANTATION OF HYPOGLOSSAL NERVE STIMULATOR (Neck)  Patient Location: PACU  Anesthesia Type:General  Level of Consciousness: awake, drowsy, and patient cooperative  Airway & Oxygen Therapy: Patient Spontanous Breathing and Patient connected to face mask oxygen  Post-op Assessment: Report given to RN, Post -op Vital signs reviewed and stable, and Patient moving all extremities X 4  Post vital signs: Reviewed and stable  Last Vitals:  Vitals Value Taken Time  BP 110/64 10/10/23 1340  Temp 36.6 C 10/10/23 1340  Pulse 61 10/10/23 1345  Resp 20 10/10/23 1345  SpO2 95 % 10/10/23 1345  Vitals shown include unfiled device data.  Last Pain:  Vitals:   10/10/23 1340  TempSrc:   PainSc: 0-No pain         Complications: No notable events documented.

## 2023-10-10 NOTE — Discharge Instructions (Signed)
POST-OPERATIVE INSTRUCTIONS:   Please restart all of your home medications if you take anything on a daily basis.  You can resume regular diet after this procedure.   DIET: Resume normal diet HYGIENE: Please wait until 48 hours after surgery before getting incisions on neck, chest, and torso wet. In the first 48 hours after surgery, will likely need to take sponge baths. WOUND CARE: Please leave pressure dressing on for 48 hours after surgery. Gently place antibiotic ointment over incisions 2 times per day; use clean q-tip. May place a clean bandage over incisions as needed. After 48 hours, you may get incisions wet with warm soap and water, but do not soak the incisions.  Pat area dry gently.  Immediately place antibiotic ointment. Take oral antibiotics as prescribed If skin around incision starts to get red (> 1cm), swollen, and/or more painful, please call the office ACTIVITY: Try to avoid sleeping on the side of your surgery, to the extent possible.   You may walk for exercise starting the day after surgery. For 2 weeks: Do not pick up anything greater than 5 pounds with the hand/arm that's on the same side as the surgery.  After 2 weeks, you may increase weight to 10 pounds.   Consider performing neck rolls 10 clockwise and 10 counterclockwise 3x/day. For 4 weeks, no strenuous activity (running, jogging, lifting weights, gardening, sports) or until cleared by physician.   PAIN MEDICATIONS: You will be prescribed Oxycodone for pain.   Take Tylenol and Motrin extra strength every 6 hrs staggered 3 hrs apart from each other Avoid aspirin for 7 days after surgery Avoid direct heat (such as heating pads) to incision sites.   May gently place ice over surgery sites as needed.  Please place a thin clean towel over skin first and then place ice bag over towel.  Ice for 10 minutes at a time only.  POST-OPERATIVE CLINIC APPOINTMENTS: 1 week: suture removal and wound check in the office.  1  month: device activation and wound check in the office. 2.5 months: check in visit to assess usage. 3-4 months: titration sleep study based on usage of >4 hrs/night.  4 months: final wound check in the office.  Yearly: device check at office.  SCAR CARE: After incisions have healed, you will have a scar, which will continue to evolve over the course of 12 months.  Caring for your incision scars will help them to be as minimal as possible. If you are out in the sun with incision exposure, please remember to place sunscreen over the incision and surrounding skin.   You may use vitamin E or "Scar ointment/cream" to help soften scar.  Please wait one month after surgery before starting this.

## 2023-10-10 NOTE — Op Note (Addendum)
 Operative Report                                                            SURGEON: Ashok Croon, MD  ASSISTANT: Eyvonne Mechanic, PA-C  PREOPERATIVE DIAGNOSIS: Obstructive sleep apnea. CPAP Intolerance  BMI 31.79  POSTOPERATIVE DIAGNOSIS: Obstructive sleep apnea. CPAP Intolerance  BMI 31.79  ANESTHESIA: General endotracheal.  ESTIMATED BLOOD LOSS: Less than 5 ml.  SPECIMENS: None.  DRAINS: None.  COMPLICATIONS: None.  Procedure: 12th cranial nerve (hypoglossal) stimulation implant along with placement of right pleural respiration sensor.  Electronic analysis of the implanted neurostimulator pulse generator system  Pre-Op Diagnosis: Moderate /Severe obstructive sleep apnea with positive airway pressure intolerance (ICD-10 G47.33). Post-Op Diagnosis: Moderate /Severe obstructive sleep apnea with positive pressure airway intolerance (ICD-10 G47.33).   Anesthesia: General endotracheal  Complications: None Brief Clinical History:     This is a 63 year old patient with a history of moderate /severe obstructive sleep apnea, who is intolerant and unable to achieve benefit from positive pressure therapy. Patient has passed the clinical, polysomnographic, and endoscopic screening criteria and presents today for the implant.    Procedure Description: The patient was brought to the Operating Room and was anesthetized via general endotracheal anesthesia without complication.  A shoulder roll was placed and the patient was prepped and draped in usual sterile fashion with the head turned to the left. Prior to prepping and draping, electrodes were placed in the genioglossus and styloglossus muscle and connected to the NIM box for intraoperative nerve monitoring.  A submental incision was made in the right upper neck approximately 2 cm below the mandible in the natural skin crease. Dissection was carried down through the subcutaneous tissue and platysma. The inferior border of the  submandibular gland was identified as well as the digastric tendon. The submandibular gland and the overlying fascia with the marginal mandibular nerve were retracted superiorly.  The digastric was retracted inferiorly.  Dissection was carried down into the digastric triangle where the hypoglossal nerve was identified in its usual fashion.   The mylohyoid muscle was retracted anteriorally, and the hypoglossal nerve was dissected up towards the floor of the mouth.  The lateral branches to retrusor muscles were identified, and tested intra-operatively using the NIM stimulator.  The cuff electrode for the hypoglossal nerve stimulator was placed distally to these branches on the medial nerve branch to the genioglossus muscle. Diagnostic evaluation confirmed activation of the genioglossus nerve, resulting in genioglossal activation and tongue protrusion, confirmed both visually and on NIM monitor. The stimulation electrode was then secured to the digastric tendon on its lateral surface with the provided anchor.  A second 5 cm incision was made in the right upper chest approximately 3 cm below the clavicle.  Dissection was carried down to the pectoralis muscle. An inferior pocket was created deep to the subcutaneous layer and superficial to the pectoralis major muscle.  In the upper portion of our chest pocket, pectoralis muscle fibers were then divided until we encountered anterior chest wall. External intercostal muscle was visualized and dissected until internal intercostal was visualized. Our respiratory sense lead was then tunneled in the fascial plane between external and internal intercostals. The distal anchor was secured to the anterior chest wall using 3-2.0 silk sutures and the proximal anchor was secured to the  pec major facscia.  The stimulation lead was then tunneled in a subplatysmal plane and brought out into the sub-clavicular pocket.  Pulse generator was then brought onto the field and the sense and  stimulation pins were both connected to the appropriate headers and secured using a torque screwdriver. The implantable pulse generator was placed in the subclavicular pocket and secured loosely to the pectoralis fascia using non-resorbable sutures.    Diagnostic evaluation of the system was run, confirming good respiratory wave from the sensing lead, good anterior tongue protrusion with stimulation, and normal impedence measurements.  All the wounds were thoroughly irrigated with bacitracin irrigation.  The wounds were then closed in multiple layers with deep Vicryl sutures and a 5-0 biosyn.  Dermabond was then applied to the skin.    The patient was then awakened, extubated, and transferred to Recovery Room in stable condition. All counts correct x2.  I was present for and performed the entire procedure.  The advanced practice practitioner (APP) assisted throughout the case. The APP was essential in retraction and counter traction when needed to make the case progress smoothly. This retraction and assistance made it possible to see the tissue planes for the procedure. The assistance was needed for tissue re-approximation and assisted with closure of the incision site

## 2023-10-11 ENCOUNTER — Encounter (HOSPITAL_COMMUNITY): Payer: Self-pay | Admitting: Otolaryngology

## 2023-10-12 NOTE — Anesthesia Postprocedure Evaluation (Signed)
Anesthesia Post Note  Patient: [name]  Procedure(s) Performed: IMPLANTATION OF HYPOGLOSSAL NERVE STIMULATOR (Neck)     Patient location during evaluation: PACU Anesthesia Type: General Level of consciousness: awake and alert Pain management: pain level controlled Vital Signs Assessment: post-procedure vital signs reviewed and stable Respiratory status: spontaneous breathing, nonlabored ventilation and respiratory function stable Cardiovascular status: blood pressure returned to baseline and stable Postop Assessment: no apparent nausea or vomiting Anesthetic complications: no   No notable events documented.  Last Vitals:  Vitals:   10/10/23 1415 10/10/23 1430  BP: 100/64 111/65  Pulse: 66 61  Resp: 18 18  Temp:    SpO2: 93% 93%    Last Pain:  Vitals:   10/10/23 1400  TempSrc:   PainSc: 4                  Ronal Maybury

## 2023-10-23 ENCOUNTER — Ambulatory Visit: Payer: Medicare Other | Attending: Cardiovascular Disease | Admitting: Cardiovascular Disease

## 2023-10-23 ENCOUNTER — Encounter: Payer: Self-pay | Admitting: Cardiovascular Disease

## 2023-10-23 VITALS — BP 132/80 | HR 68 | Ht 65.5 in | Wt 194.2 lb

## 2023-10-23 DIAGNOSIS — I10 Essential (primary) hypertension: Secondary | ICD-10-CM | POA: Insufficient documentation

## 2023-10-23 DIAGNOSIS — G4733 Obstructive sleep apnea (adult) (pediatric): Secondary | ICD-10-CM | POA: Diagnosis present

## 2023-10-23 DIAGNOSIS — E785 Hyperlipidemia, unspecified: Secondary | ICD-10-CM | POA: Insufficient documentation

## 2023-10-23 DIAGNOSIS — I25709 Atherosclerosis of coronary artery bypass graft(s), unspecified, with unspecified angina pectoris: Secondary | ICD-10-CM | POA: Insufficient documentation

## 2023-10-23 NOTE — Progress Notes (Signed)
 10/23/2023 [name]   March 28, 1961  [id number]  Primary Physician Earnest Bailey, Sharrie Rothman, MD Primary Cardiologist: Runell Gess MD FACP, [place], [place], [place]  HPI:  [name] is a 63 y.o.   severely overweight divorced Caucasian female mother of 2, grandmother and 3 grandchildren who I last saw  in the office 10/03/2022.  Her mother, [name] , as also patient of mine. She is currently in school getting her social workers degree an [employer].She has a history of RCA stenting by Dr. Dr. Algie Coffer in the past. She had coronary artery bypass grafting November 2008 with LIMA to LAD, vein to ramus branch and PDA. She recently stopped smoking 4 months ago. Her palms include hypertension, hyperlipidemia and obstructive sleep apnea intolerant to see. I catheterized her 03/04/10 revealing patent grafts and normal LV function. Her last  Myoview performed 04/18/11 with nonischemic. She was experiencing a lot of stress in her life and developed chest pain. She saw Norma Fredrickson, nurse practitioner on 02/11/16. At that time, she was up for cardiac catheterization which was performed 4 days later by Dr. Peter Swazil[place]. He demonstrated widely patent grafts, normal LV function and unchanged anatomy compared to her cath performed in 2011.   She was admitted to the hospital with unstable angina 06/20/2019 and discharged 5 days later.  I performed cardiac catheterization on her 06/23/2019 revealing a high-grade distal PDA SVG stenosis which I stented.  She is complicated by no reflow requiring additional stenting and Aggrastat.    She has stopped smoking.   Unfortunately she was readmitted to the hospital with a non-STEMI 03/05/2020 underwent diagnostic coronary angiography by Dr. Garnette Scheuermann revealing occluded RCA vein graft, the vessel which I had stented in November of last year.  She did have left-to-right collaterals.  Echo showed normal LV function.  Her medicines were adjusted, her ranolazine was increased.    She had a Myoview performed 01/06/2022 which was low risk with minimal inferior scar.  She is on multiple antianginal medications including long-acting nitrate, Ranexa, beta-blocker and amlodipine with a known occluded RCA, PDA vein graft and left-to-right collaterals.  She does get occasional chest and arm pain.   Since I saw her a year ago she continues to do well.  She does complain of effort angina however.  She recently had the inspire device implanted for struct of sleep apnea since she is intolerant to CPAP because of claustrophobia.   Current Meds  Medication Sig   albuterol (PROVENTIL HFA;VENTOLIN HFA) 108 (90 BASE) MCG/ACT inhaler Inhale 2 puffs into the lungs every 6 (six) hours as needed for wheezing or shortness of breath.    Alirocumab (PRALUENT) 150 MG/ML SOAJ Inject 1 mL (150 mg total) into the skin every 14 (fourteen) days.   amLODipine (NORVASC) 5 MG tablet Take 1 tablet (5 mg total) by mouth daily.   Ascorbic Acid (VITAMIN C PO) Take 1 tablet by mouth daily.   aspirin 81 MG chewable tablet Chew 81 mg by mouth in the morning and at bedtime.   atorvastatin (LIPITOR) 80 MG tablet Take 1 tablet (80 mg total) by mouth daily.   brimonidine (ALPHAGAN) 0.15 % ophthalmic solution Place 1 drop into both eyes 2 (two) times daily.    Budeson-Glycopyrrol-Formoterol (BREZTRI AEROSPHERE) 160-9-4.8 MCG/ACT AERO Inhale 2 puffs into the lungs in the morning and at bedtime.   doxycycline (VIBRA-TABS) 100 MG tablet Take 1 tablet (100 mg total) by mouth 2 (two) times daily.  escitalopram (LEXAPRO) 20 MG tablet Take 20 mg by mouth daily.   fluticasone (FLONASE) 50 MCG/ACT nasal spray Place 2 sprays into both nostrils daily. (Patient taking differently: Place 2 sprays into both nostrils daily as needed for allergies.)   icosapent Ethyl (VASCEPA) 1 g capsule TAKE 2 CAPSULES BY MOUTH 2 TIMES DAILY.   isosorbide mononitrate (IMDUR) 30 MG 24 hr tablet TAKE 3 TABLETS BY MOUTH DAILY.   lamoTRIgine  (LAMICTAL) 100 MG tablet Take 100 mg by mouth daily.   metoprolol tartrate (LOPRESSOR) 25 MG tablet TAKE 1/2 TABLET BY MOUTH TWICE A DAY   niacin (NIASPAN) 1000 MG CR tablet TAKE 1 TABLET (1,000 MG TOTAL) BY MOUTH AT BEDTIME.   nitroGLYCERIN (NITROSTAT) 0.4 MG SL tablet Place 1 tablet (0.4 mg total) under the tongue every 5 (five) minutes as needed for chest pain.   ondansetron (ZOFRAN-ODT) 4 MG disintegrating tablet Take 1 tablet (4 mg total) by mouth every 8 (eight) hours as needed for nausea or vomiting.   pantoprazole (PROTONIX) 40 MG tablet Take 1 tablet (40 mg total) by mouth daily.   QUEtiapine Fumarate 150 MG TABS Take 150 mg by mouth at bedtime.   ranolazine (RANEXA) 500 MG 12 hr tablet TAKE 1 TABLET BY MOUTH TWICE A DAY (Patient taking differently: Take by mouth 2 (two) times daily. 1 gram twice daily)   VITAMIN E PO Take 1 tablet by mouth daily.     Allergies  Allergen Reactions   Benadryl [Diphenhydramine Hcl] Shortness Of Breath and Swelling    Tongue swelling   Codeine Other (See Comments)    "makes me psychotic"    Haldol [Haloperidol Decanoate] Anaphylaxis   Amoxicillin Hives and Swelling    Swelling to extremities Did it involve swelling of the face/tongue/throat, SOB, or low BP? Yes Did it involve sudden or severe rash/hives, skin peeling, or any reaction on the inside of your mouth or nose? Yes Did you need to seek medical attention at a hospital or doctor's office? Yes When did it last happen?    young adult   If all above answers are "NO", may proceed with cephalosporin use.   Bee Venom Swelling and Other (See Comments)    "pimples" from head to toe and whole body swelling    Social History   Socioeconomic History   Marital status: Divorced    Spouse name: Not on file   Number of children: Not on file   Years of education: Not on file   Highest education level: Not on file  Occupational History   Not on file  Tobacco Use   Smoking status: Some Days     Current packs/day: 2.00    Average packs/day: 2.0 packs/day for 38.2 years (76.3 ttl pk-yrs)    Types: Cigarettes    Start date: 08/21/1985   Smokeless tobacco: Never   Tobacco comments:    10/23/2023 patient smoke a pack of cigarettes a month  Vaping Use   Vaping status: Never Used  Substance and Sexual Activity   Alcohol use: No   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   Sexual activity: Not on file  Other Topics Concern   Not on file  Social History Narrative   Not on file   Social Drivers of Health   Financial Resource Strain: Low Risk  (09/04/2023)   Received from [facility]   Overall Financial Resource Strain (CARDIA)    Difficulty of Paying Living Expenses: Not  hard at all  Food Insecurity: No Food Insecurity (09/04/2023)   Received from [facility]   Hunger Vital Sign    Worried About Running Out of Food in the Last Year: Never true    Ran Out of Food in the Last Year: Never true  Transportation Needs: No Transportation Needs (09/04/2023)   Received from [facility] - Transportation    Lack of Transportation (Medical): No    Lack of Transportation (Non-Medical): No  Physical Activity: Insufficiently Active (04/26/2023)   [facility]   Exercise Vital Sign    Days of Exercise per Week: 1 day    Minutes of Exercise per Session: 50 min  Stress: Stress Concern Present (04/26/2023)   Received from [facility] - Occupational Stress Questionnaire    Feeling of Stress : To some extent  Social Connections: Somewhat Isolated (04/26/2023)   Received from [facility]   Social Network    How would you rate your social network (family, work, friends)?: Restricted participation with some degree of social isolation  Intimate Partner Violence: Not At Risk (06/30/2023)   Humiliation, Afraid, Rape, and Kick questionnaire    Fear of Current or Ex-Partner: No    Emotionally Abused: No    Physically  Abused: No    Sexually Abused: No     Review of Systems: General: negative for chills, fever, night sweats or weight changes.  Cardiovascular: negative for chest pain, dyspnea on exertion, edema, orthopnea, palpitations, paroxysmal nocturnal dyspnea or shortness of breath Dermatological: negative for rash Respiratory: negative for cough or wheezing Urologic: negative for hematuria Abdominal: negative for nausea, vomiting, diarrhea, bright red blood per rectum, melena, or hematemesis Neurologic: negative for visual changes, syncope, or dizziness All other systems reviewed and are otherwise negative except as noted above.    Blood pressure 132/80, pulse 68, height 5' 5.5" (1.664 m), weight 194 lb 3.2 oz (88.1 kg), last menstrual period 11/16/2011, SpO2 98%.  General appearance: alert and no distress Neck: no adenopathy, no carotid bruit, no JVD, supple, symmetrical, trachea midline, and thyroid not enlarged, symmetric, no tenderness/mass/nodules Lungs: clear to auscultation bilaterally Heart: regular rate and rhythm, S1, S2 normal, no murmur, click, rub or gallop Extremities: extremities normal, atraumatic, no cyanosis or edema Pulses: 2+ and symmetric Skin: Skin color, texture, turgor normal. No rashes or lesions Neurologic: Grossly normal  EKG not performed today      ASSESSMENT AND PLAN:   Hyperlipidemia LDL goal <70 History of hyperlipidemia on high-dose atorvastatin, and Vascepa with lipid profile performed 10/03/2022 revealing a total cholesterol of 128, LDL 47 and HDL 51.  Coronary artery disease involving coronary bypass graft History of CAD status post RCA stenting by Dr. Algie Coffer in the past.  She had bypass surgery in November 2008 with a LIMA to her LAD, vein to ramus branch and PDA.  I catheterized her 03/04/2010 revealing patent grafts and normal LV function.  She was catheterized by Dr. Peter Swaziland June 2017 again revealing unchanged anatomy.  She was hospitalized with  unstable angina 06/20/2019 and discharged 5 days later.  I performed cardiac catheterization on her 06/23/2019 revealing high-grade distal PDA SVG stenosis which I stented.  This was complicated by no reflow requiring additional stenting at an Aggrastat.  Unfortunately, she was readmitted with a non-STEMI 03/05/2020 and underwent diagnostic cath by Dr. Katrinka Blazing revealing occluded RCA vein graft which I previously stented with left-to-right collaterals.  Medical therapy was  recommended.  Her last Myoview performed 01/06/2022 was low risk with minimal inferior scar.  She is on maximum antianginal medications although she still complains of effort angina.  I am going to increase her ranolazine from 500 mg p.o. twice daily to 1 g p.o. twice daily.  Essential hypertension History of essential hypertension her blood pressure measured today at 132/80.  She is on amlodipine, and metoprolol.  OSA (obstructive sleep apnea) History of obstructive sleep apnea intolerant to CPAP.  She recently had the "inspire device" implanted.     Runell Gess MD FACP,FACC,FAHA, [facility] 10/23/2023 3:21 PM

## 2023-10-23 NOTE — Assessment & Plan Note (Signed)
 History of essential hypertension her blood pressure measured today at 132/80.  She is on amlodipine, and metoprolol.

## 2023-10-23 NOTE — Assessment & Plan Note (Signed)
 History of obstructive sleep apnea intolerant to CPAP.  She recently had the "inspire device" implanted.

## 2023-10-23 NOTE — Patient Instructions (Addendum)
 Medication Instructions:  Increase Ranolazine 1 gm twice daily( ok to take 2 500 mg tablets twice daily that you have at home)  *If you need a refill on your cardiac medications before your next appointment, please call your pharmacy*   Lab Work: NONE ordered at this time of appointment    Testing/Procedures: NONE ordered at this time of appointment    Follow-Up: At [facility], you and your health needs are our priority.  As part of our continuing mission to provide you with exceptional heart care, we have created designated Provider Care Teams.  These Care Teams include your primary Cardiologist (physician) and Advanced Practice Providers (APPs -  Physician Assistants and Nurse Practitioners) who all work together to provide you with the care you need, when you need it.  We recommend signing up for the patient portal called "MyChart".  Sign up information is provided on this After Visit Summary.  MyChart is used to connect with patients for Virtual Visits (Telemedicine).  Patients are able to view lab/test results, encounter notes, upcoming appointments, etc.  Non-urgent messages can be sent to your provider as well.   To learn more about what you can do with MyChart, go to [email or web address].    Your next appointment:    3 month f/u Harriet Pho NP 1 year f/u Dr. Allyson Sabal   Provider:   Nanetta Batty, MD  or [name], DNP, ANP

## 2023-10-23 NOTE — Assessment & Plan Note (Signed)
 History of CAD status post RCA stenting by Dr. Algie Coffer in the past.  She had bypass surgery in November 2008 with a LIMA to her LAD, vein to ramus branch and PDA.  I catheterized her 03/04/2010 revealing patent grafts and normal LV function.  She was catheterized by Dr. Peter Swaziland June 2017 again revealing unchanged anatomy.  She was hospitalized with unstable angina 06/20/2019 and discharged 5 days later.  I performed cardiac catheterization on her 06/23/2019 revealing high-grade distal PDA SVG stenosis which I stented.  This was complicated by no reflow requiring additional stenting at an Aggrastat.  Unfortunately, she was readmitted with a non-STEMI 03/05/2020 and underwent diagnostic cath by Dr. Katrinka Blazing revealing occluded RCA vein graft which I previously stented with left-to-right collaterals.  Medical therapy was recommended.  Her last Myoview performed 01/06/2022 was low risk with minimal inferior scar.  She is on maximum antianginal medications although she still complains of effort angina.  I am going to increase her ranolazine from 500 mg p.o. twice daily to 1 g p.o. twice daily.

## 2023-10-23 NOTE — Assessment & Plan Note (Signed)
 History of hyperlipidemia on high-dose atorvastatin, and Vascepa with lipid profile performed 10/03/2022 revealing a total cholesterol of 128, LDL 47 and HDL 51.

## 2023-10-24 ENCOUNTER — Encounter (INDEPENDENT_AMBULATORY_CARE_PROVIDER_SITE_OTHER): Payer: Self-pay | Admitting: Otolaryngology

## 2023-10-24 ENCOUNTER — Ambulatory Visit (INDEPENDENT_AMBULATORY_CARE_PROVIDER_SITE_OTHER): Payer: Medicare Other | Admitting: Otolaryngology

## 2023-10-24 VITALS — BP 157/95 | HR 85

## 2023-10-24 DIAGNOSIS — Z9889 Other specified postprocedural states: Secondary | ICD-10-CM

## 2023-10-24 DIAGNOSIS — Z789 Other specified health status: Secondary | ICD-10-CM

## 2023-10-24 DIAGNOSIS — G4733 Obstructive sleep apnea (adult) (pediatric): Secondary | ICD-10-CM

## 2023-10-24 DIAGNOSIS — Z6831 Body mass index (BMI) 31.0-31.9, adult: Secondary | ICD-10-CM

## 2023-10-24 NOTE — Progress Notes (Signed)
 ENT Progress Note  Update 09/27/23:  Discussed the use of AI scribe software for clinical note transcription with the patient, who gave verbal consent to proceed.  Discussed the use of AI scribe software for clinical note transcription with the patient, who gave verbal consent to proceed.  History of Present Illness   [name] is a 63 year old female who presents for a postoperative follow-up visit.  She has been experiencing a sore throat and mild headaches since the operation. The pain is not severe enough to require medication, and she has not taken any pain relief medication postoperatively.  There is tenderness at the surgical site, which she finds manageable. She notices some rough edges and puckering at the site, likely due to the skin glue used during the procedure. Overall, she feels the area is healing well.  In her daily activities, she has been cautious and currently not lifting her pets. Avoids strenuous activities.       Records reviewed  06/28/24  - had laparoscopic gallbladder surgery and then admitted 07/06/23 2/2 bleeding and wound dehiscence  Seen by Cards 07/10/23 - due to elevated trop was sent to see Cards post-admission  Coronary artery disease: History of coronary artery bypass grafting (LIMA to LAD, SVG to ramus, SVG to PDA drug-eluting stent to the origin of the mid graft lesion 06/23/2019.  She is continued on medical management only.  She does have chronic angina and is now on isosorbide 30 mg daily and Ranexa 500 mg twice daily.  If symptoms are persistent would increase Ranexa to 1000 mg twice daily.  Remains on metoprolol.  Continue aggressive secondary risk modification with lipid management, blood pressure control, weight loss, and purposeful exercise.   2.  Demand ischemia: Found during hospitalization status post laparoscopic cholecystectomy in the setting of bleeding postoperatively with hemoglobin less than 8.5.  Plavix was discontinued by surgeon.   Due to follow-up with surgeon within the next few weeks.  Would recommend restarting when she is fully healed.  Follow-up labs are being drawn by PCP tomorrow.   3.  Hypercholesterolemia: She is on Praluent 150 mcg injection every 14 days along with atorvastatin 80 mg daily.  Goal of LDL less than 50.     4.  Hypertension: Blood pressure is very well-controlled.  No changes in her regimen would continue amlodipine, metoprolol, as well as continuing isosorbide.  If she begins to have hypotension or dizziness she is to report this at which time we may need to adjust isosorbide.   5.  OSA: Plan for Inspira implantation.  Following up with consulting surgeon in December 2024.     Initial Consult 05/29/23 Chief Complaint: OSA, CPAP intolerance and other chronic problems  Referring sleep doc: Dr Vassie Loll  HPI: Pt is a 63 y.o. year old female with a CAD s/p CABG, HTN, hx of NSTEMI, HLD, bipolar disorder, severe OSA, GERD here for evaluation of OSA and CPAP intolerance as well as other chronic problems. She has h/o OSA dx approx 2010. Since diagnosis, pt has not been able to tolerate their CPAP device. AHI on most recent diagnostic sleep study was 33.2 on 10/14/2022. Pt has not been able to tolerate CPAP due to mask pressure on her face, and not being able to sleep well with the CPAP mask on. Pt has tried multiple masks for their OSA. Pt had a DISE exam in the past, and was considered a candidate for Inspire surgery. Pt  had no recent weight changes  but reports weight gain after the birth of her youngest son with inability to lose weight following her pregnancy.  Denies history of insomnia.  Current smoker but in the process of cutting down.  She is being treated for COPD, on inhalers.  She reports heart attack with cardiac arrest during cardiac cath in 2009. She is f/b Cardiology Dr Allyson Sabal and has a diagnosis of COPD. She was able to cut down from 3 PPD to 2 cigarettes a day. She is f/b [facility], and on  inhalers. She reports remote history of cocaine addiction, no recent drug use, but but avoid narcotic pain medications.   Records reviewed  Dr Reginia Naas Op Note for DISE - passed 05/17/23  Note by [facility] 04/30/23 63 year old female, active smoker followed for COPD with chronic bronchitis and emphysema. She is a former patient of Dr. Ulyses Jarred and last seen in office 03/23/2023. Past medical history significant for CAD s/p CABG, HTN, hx of NSTEMI, HLD, bipolar disorder, severe OSA, GERD.   TEST/EVENTS:  08/25/2007 PSG: AHI 20.6/h >> optimal CPAP pressure 7 cmH2O 10/14/2022 HST: AHI 33.2/h, SpO2 low 80%   03/22/2022: OV with Dr. Kendrick Fries. Coughing up white mucus for years; no change. Recently had a flare up after respiratory infection following exposure to sick kids. Treated with medications but can't remember the names. DOE most of her adult life. Daytime fatigue; snores a lot. Advanced cardiac disease, CAD. Previous CABG. Spirometry showed moderate airflow obstruction. Still smoking, likely contributing to symptoms. Noncompliant with Breo. Will switch to Anoro. Smoking cessation advised. Had CT outside - will request records. Split night sleep study ordered. Follow up 1 week.   05/02/2023: Today - follow up Patient presents today for overdue follow up. Her breathing has been about the same since she was here last. No worse but no better. Not currently on any scheduled inhalers. She wasn't sure the Anoro really helped her. Doesn't remember much about her response to [facility]. She does use her albuterol daily. No recent exacerbations requiring steroids/abx. She has a daily cough with white phlegm, which is unchanged. Notices an occasional wheeze. She does struggle with reflux. She has not been on anything recently. She has this regardless of what she eats but definitely avoids certain trigger foods. Denies fevers, chills, hemoptysis, leg swelling, orthopnea, anorexia, weight loss. No difficulties swallowing,  abd pain, changes in bowel habits, hoarse voice. She is seeing an ENT for chronic sinusitis symptoms. No worse than normal.    She has had a repeat sleep study since she was here last. She has severe sleep apnea with AHI 33.2/h from [facility] 10/14/2022. She has significant daytime fatigue and restless sleep at night. Never feels rested. She was originally diagnosed with OSA in 2009. She was on CPAP but could not tolerate it even after a CPAP desensitization study. She tried multiple masks and setting adjustments. She has PTSD and is just unable to wear something on her face at night. She feels like her symptoms have gotten worse over the past few years. She is interested in [place] device.     Past Medical/Surgical History She  Her  has a past surgical history that includes ORIF foot fracture (Left); C-SECTIONS X 2 (1985, 1988); TOE SURGERIES TO REMOVE NAILS DUE TO CLUBBING; Total knee arthroplasty (Right, 10/21/2012); Cardiac catheterization (12/10/2006; 12/18/2006; 07/10/2007; 07/19/2009); Cardiac catheterization (03/04/2010); Knee arthroscopy with anterior cruciate ligament (acl) repair (Right, 01/30/2008); Coronary artery bypass graft (07/16/2007); Novasure ablation (12/19/2007); Flexible bronchoscopy (07/16/2007);  Club foot release (Bilateral); Hand surgery (Bilateral); Hammertoe reconstruction with weil osteotomy (Right, 06/03/2015); Cardiac catheterization (N/A, 02/15/2016); LEFT HEART CATH AND CORS/GRAFTS ANGIOGRAPHY (N/A, 06/23/2019); CORONARY STENT INTERVENTION (N/A, 06/23/2019); LEFT HEART CATH AND CORS/GRAFTS ANGIOGRAPHY (N/A, 03/01/2020); Colonoscopy; Upper gastrointestinal endoscopy; Drug induced endoscopy (N/A, 05/17/2023); Cholecystectomy (N/A, 06/29/2023); and Implantation of hypoglossal nerve stimulator (N/A, 10/10/2023).  Past Family/Social History Her family history includes Anesthesia problems in her father; Breast cancer in her maternal aunt and maternal aunt; Colon polyps in her sister; Heart  disease in her father; Kidney cancer in her mother; Lung cancer in her mother. She  reports that she has been smoking cigarettes. She started smoking about 38 years ago. She has a 76.3 pack-year smoking history. She has never used smokeless tobacco. She reports that she does not drink alcohol and does not use drugs.  Medications/Allergies/Immunizations Allergies: Benadryl [diphenhydramine hcl], Codeine, Haldol [haloperidol decanoate], Amoxicillin, and Bee venom, Immunizations:  Immunization History  Administered Date(s) Administered   Influenza, Seasonal, Injecte, Preservative Fre 06/19/2023   PNEUMOCOCCAL CONJUGATE-20 04/27/2023    Review of Systems  ROS: Constitutional: Negative for fever, weight loss and weight gain. Cardiovascular: Negative for chest pain and dyspnea on exertion. Respiratory: Is not experiencing shortness of breath at rest. Gastrointestinal: Negative for nausea and vomiting. Neurological: Negative for headaches. Psychiatric: The patient is not nervous/anxious  OBJECTIVE:  Physical Exam Blood pressure (!) 157/95, pulse 85, last menstrual period 11/16/2011, SpO2 97%. General:  Well-developed, well-nourished, no apparent distress Respiratory Respiratory effort:  Equal inspiration and expiration without stridor Auscultation:  Equal breath sounds bilaterally Cardiovascular Heart:  regular rate and rhythm Peripheral Vascular:  Warm extremities with equal pulses Eyes: No nystagmus with equal extraocular motion bilaterally Neuro/Psych/Balance: Patient oriented to person, place, and time;  Appropriate mood and affect;  Gait is intact with no imbalance; Cranial nerves I-XII are intact Head and Face Inspection:  Normocephalic and atraumatic without mass or lesion Facial Strength:  Facial motility symmetric and full bilaterally ENT Pinna:  External ear intact and fully developed bilaterally External canal:  Canal is patent with intact skin bilaterally Tympanic  Membrane:  Clear and mobile bilaterally External nose:  No scar or anatomic deformity Oral cavity/oropharynx:  No erythema or exudate, No tonsils Tongue/palate position: Friedman 2-3. Tongue movement normal all directions  Neck and Trachea:  Midline trachea without mass or lesion Thyroid:  No mass or nodularity Lymphatics:  No lymphadenopathy  ASSESSMENT/PLAN: Encounter Diagnoses  Name Primary?   OSA (obstructive sleep apnea) Yes   Intolerance of continuous positive airway pressure (CPAP) ventilation    BMI 31.0-31.9,adult     Assessment and Plan    Postoperative Care Postoperative follow-up after recent surgery. Reports mild sore throat and headaches, but no significant pain requiring medication. Surgical site is tender but healing well with skin glue and puckering expected to flatten in 2-3 months. Advised to avoid lifting and handling cats for four weeks to prevent strain on the surgical site. Discussed application of Vaseline with a Q-tip and importance of preventing direct sunlight; sunscreen can be applied in 1-2 weeks.  - Avoid lifting and handling cats for four weeks - Apply Vaseline with a Q-tip to the surgical site - Prevent direct sunlight on the surgical site; apply sunscreen in 1-2 weeks - Scheduled to see [facility] for activation 11/15/23  - Call if any issues arise

## 2023-11-10 ENCOUNTER — Other Ambulatory Visit: Payer: Self-pay | Admitting: Cardiovascular Disease

## 2023-11-10 DIAGNOSIS — K219 Gastro-esophageal reflux disease without esophagitis: Secondary | ICD-10-CM

## 2023-11-15 ENCOUNTER — Ambulatory Visit (INDEPENDENT_AMBULATORY_CARE_PROVIDER_SITE_OTHER): Payer: Medicare Other | Admitting: Nurse Practitioner

## 2023-11-15 VITALS — BP 160/100 | HR 72

## 2023-11-15 DIAGNOSIS — G4733 Obstructive sleep apnea (adult) (pediatric): Secondary | ICD-10-CM

## 2023-11-15 NOTE — Progress Notes (Signed)
 Visit canceled due to ongoing healing. Postponed to 01/04/2024

## 2023-11-15 NOTE — Progress Notes (Deleted)
 @Patient  ID: [name], female    DOB: 1960/09/05, 63 y.o.   MRN: [id number]  Chief Complaint  Patient presents with   Follow-up    Referring provider: Macy Mis, MD  HPI: 62 year old female, active smoker followed for COPD with chronic bronchitis and emphysema. She is a former patient of Dr. Ulyses Jarred and last seen in office 03/23/2023. Past medical history significant for CAD s/p CABG, HTN, hx of NSTEMI, HLD, bipolar disorder, severe OSA, GERD.  TEST/EVENTS:  08/25/2007 PSG: AHI 20.6/h >> optimal CPAP pressure 7 cmH2O 10/14/2022 HST: AHI 33.2/h, SpO2 low 80%  03/22/2022: OV with Dr. Kendrick Fries. Coughing up white mucus for years; no change. Recently had a flare up after respiratory infection following exposure to sick kids. Treated with medications but can't remember the names. DOE most of her adult life. Daytime fatigue; snores a lot. Advanced cardiac disease, CAD. Previous CABG. Spirometry showed moderate airflow obstruction. Still smoking, likely contributing to symptoms. Noncompliant with Breo. Will switch to Anoro. Smoking cessation advised. Had CT outside - will request records. Split night sleep study ordered. Follow up 1 week.  05/02/2023: Today - follow up Patient presents today for overdue follow up. Her breathing has been about the same since she was here last. No worse but no better. Not currently on any scheduled inhalers. She wasn't sure the Anoro really helped her. Doesn't remember much about her response to [facility]. She does use her albuterol daily. No recent exacerbations requiring steroids/abx. She has a daily cough with white phlegm, which is unchanged. Notices an occasional wheeze. She does struggle with reflux. She has not been on anything recently. She has this regardless of what she eats but definitely avoids certain trigger foods. Denies fevers, chills, hemoptysis, leg swelling, orthopnea, anorexia, weight loss. No difficulties swallowing, abd pain, changes in bowel  habits, hoarse voice. She is seeing an ENT for chronic sinusitis symptoms. No worse than normal.   She has had a repeat sleep study since she was here last. She has severe sleep apnea with AHI 33.2/h from [facility] 10/14/2022. She has significant daytime fatigue and restless sleep at night. Never feels rested. She was originally diagnosed with OSA in 2009. She was on CPAP but could not tolerate it even after a CPAP desensitization study. She tried multiple masks and setting adjustments. She has PTSD and is just unable to wear something on her face at night. She feels like her symptoms have gotten worse over the past few years. She is interested in [place] device.   Allergies  Allergen Reactions   Benadryl [Diphenhydramine Hcl] Shortness Of Breath and Swelling    Tongue swelling   Codeine Other (See Comments)    "makes me psychotic"    Haldol [Haloperidol Decanoate] Anaphylaxis   Amoxicillin Hives and Swelling    Swelling to extremities Did it involve swelling of the face/tongue/throat, SOB, or low BP? Yes Did it involve sudden or severe rash/hives, skin peeling, or any reaction on the inside of your mouth or nose? Yes Did you need to seek medical attention at a hospital or doctor's office? Yes When did it last happen?    young adult   If all above answers are "NO", may proceed with cephalosporin use.   Bee Venom Swelling and Other (See Comments)    "pimples" from head to toe and whole body swelling    Immunization History  Administered Date(s) Administered   Influenza, Seasonal, Injecte, Preservative Fre 06/19/2023   PNEUMOCOCCAL CONJUGATE-20 04/27/2023  Past Medical History:  Diagnosis Date   Allergy    Anemia    takes iron supplement   Arthritis    knees, shoulders   Asthma    Bipolar 1 disorder (HCC)    Blood transfusion without reported diagnosis    Cataract, immature    CHF (congestive heart failure) (HCC)    Clotting disorder (HCC)    she denied any specific genetic  bleeding/clotting diagnosis (10/05/23)   COPD (chronic obstructive pulmonary disease) (HCC)    Coronary artery disease    triple vessel   Depression    Dyspnea    Family history of adverse reaction to anesthesia    states father "did not do well" with anesthesia; died last time he had surgery; also had heart disease   Gallstones    GERD (gastroesophageal reflux disease)    occasional - no current med.   Glaucoma    Hammertoe 05/2015   right second toe   Heart murmur    Hepatic steatosis    High cholesterol    History of MI (myocardial infarction)    Hypertension    states under control with meds., has been on med. x 8 yr.   Myocardial infarction (HCC)    Osteoporosis    Panic attacks    Peripheral vascular disease (HCC)    carotid   Renal cyst, left    Schizophrenia (HCC)    Short of breath on exertion    Sleep apnea    Inspire in future   Urinary, incontinence, stress female    Wears dentures    upper   Wears partial dentures    lower    Tobacco History: Social History   Tobacco Use  Smoking Status Some Days   Current packs/day: 2.00   Average packs/day: 2.0 packs/day for 38.2 years (76.5 ttl pk-yrs)   Types: Cigarettes   Start date: 08/21/1985  Smokeless Tobacco Never  Tobacco Comments   10/23/2023 patient smoke a pack of cigarettes a month   Ready to quit: Not Answered Counseling given: Not Answered Tobacco comments: 10/23/2023 patient smoke a pack of cigarettes a month   Outpatient Medications Prior to Visit  Medication Sig Dispense Refill   acetaminophen (TYLENOL) 500 MG tablet Take 1 tablet (500 mg total) by mouth every 6 (six) hours as needed. Please take every 6 hrs and stagger with Motrin 3 hrs apart from each other. Please do not exceed maximum daily dose to avoid liver damage 30 tablet 1   albuterol (PROVENTIL HFA;VENTOLIN HFA) 108 (90 BASE) MCG/ACT inhaler Inhale 2 puffs into the lungs every 6 (six) hours as needed for wheezing or shortness of  breath.      Alirocumab (PRALUENT) 150 MG/ML SOAJ Inject 1 mL (150 mg total) into the skin every 14 (fourteen) days. 6 mL 6   amLODipine (NORVASC) 5 MG tablet Take 1 tablet (5 mg total) by mouth daily. 90 tablet 3   Ascorbic Acid (VITAMIN C PO) Take 1 tablet by mouth daily.     aspirin 81 MG chewable tablet Chew 81 mg by mouth in the morning and at bedtime.     atorvastatin (LIPITOR) 80 MG tablet Take 1 tablet (80 mg total) by mouth daily. 90 tablet 1   brimonidine (ALPHAGAN) 0.15 % ophthalmic solution Place 1 drop into both eyes 2 (two) times daily.      Budeson-Glycopyrrol-Formoterol (BREZTRI AEROSPHERE) 160-9-4.8 MCG/ACT AERO Inhale 2 puffs into the lungs in the morning and at bedtime. 10.7 each 5  doxycycline (VIBRA-TABS) 100 MG tablet Take 1 tablet (100 mg total) by mouth 2 (two) times daily. 20 tablet 0   escitalopram (LEXAPRO) 20 MG tablet Take 20 mg by mouth daily.     fluticasone (FLONASE) 50 MCG/ACT nasal spray Place 2 sprays into both nostrils daily. (Patient taking differently: Place 2 sprays into both nostrils daily as needed for allergies.) 16 g 6   ibuprofen (ADVIL) 600 MG tablet Take 1 tablet (600 mg total) by mouth every 6 (six) hours. Please take every 6 hrs, and stagger this medication 3 hrs apart from Tylenol 30 tablet 0   icosapent Ethyl (VASCEPA) 1 g capsule TAKE 2 CAPSULES BY MOUTH 2 TIMES DAILY. 120 capsule 3   isosorbide mononitrate (IMDUR) 30 MG 24 hr tablet TAKE 3 TABLETS BY MOUTH DAILY. 270 tablet 3   lamoTRIgine (LAMICTAL) 100 MG tablet Take 100 mg by mouth daily.     metoprolol tartrate (LOPRESSOR) 25 MG tablet TAKE 1/2 TABLET BY MOUTH TWICE A DAY 90 tablet 3   niacin (NIASPAN) 1000 MG CR tablet TAKE 1 TABLET (1,000 MG TOTAL) BY MOUTH AT BEDTIME. 90 tablet 3   nitroGLYCERIN (NITROSTAT) 0.4 MG SL tablet Place 1 tablet (0.4 mg total) under the tongue every 5 (five) minutes as needed for chest pain. 25 tablet 3   ondansetron (ZOFRAN-ODT) 4 MG disintegrating tablet Take 1  tablet (4 mg total) by mouth every 8 (eight) hours as needed for nausea or vomiting. 12 tablet 0   oxyCODONE (ROXICODONE) 5 MG immediate release tablet Take 1 tablet (5 mg total) by mouth every 6 (six) hours as needed for severe pain (pain score 7-10) (for postop pain). 20 tablet 0   pantoprazole (PROTONIX) 40 MG tablet TAKE 1 TABLET BY MOUTH EVERY DAY 90 tablet 3   QUEtiapine Fumarate 150 MG TABS Take 150 mg by mouth at bedtime.     ranolazine (RANEXA) 500 MG 12 hr tablet TAKE 1 TABLET BY MOUTH TWICE A DAY (Patient taking differently: Take by mouth 2 (two) times daily. 1 gram twice daily) 180 tablet 2   VITAMIN E PO Take 1 tablet by mouth daily.     No facility-administered medications prior to visit.     Review of Systems:   Constitutional: No weight loss or gain, night sweats, fevers, chills, or lassitude. +fatigue  HEENT: No difficulty swallowing, tooth/dental problems, or sore throat. No sneezing, itching, ear ache. +nasal congestion, post nasal drip, headaches (AM) CV:  No chest pain, orthopnea, PND, swelling in lower extremities, anasarca, dizziness, palpitations, syncope Resp: +shortness of breath with exertion; daily productive cough; occasional wheeze; snoring; witnessed nocturnal apneas. No excess mucus or change in color of mucus. No hemoptysis. No chest wall deformity GI:  +heartburn, indigestion. No abdominal pain, nausea, vomiting, diarrhea, change in bowel habits, loss of appetite, bloody stools.  GU: No dysuria, change in color of urine, urgency or frequency. Skin: No rash, lesions, ulcerations MSK:  No joint pain or swelling.   Neuro: No dizziness or lightheadedness.  Psych: Mood stable. +sleep disturbance     Physical Exam:  BP (!) 160/100 (BP Location: Left Arm, Patient Position: Sitting, Cuff Size: Normal)   Pulse 72   LMP 11/16/2011   SpO2 97%   GEN: Pleasant, interactive, well-appearing; obese; in no acute distress. HEENT:  Normocephalic and atraumatic.  PERRLA. Sclera white. Nasal turbinates pink, moist and patent bilaterally. No rhinorrhea present. Oropharynx pink and moist, without exudate or edema. No lesions, ulcerations, or postnasal drip. Mallampati  III NECK:  Supple w/ fair ROM. No JVD present. Normal carotid impulses w/o bruits. Thyroid symmetrical with no goiter or nodules palpated. No lymphadenopathy.   CV: RRR, no m/r/g, no peripheral edema. Pulses intact, +2 bilaterally. No cyanosis, pallor or clubbing. PULMONARY:  Unlabored, regular breathing. Clear bilaterally A&P w/o wheezes/rales/rhonchi. No accessory muscle use.  GI: BS present and normoactive. Soft, non-tender to palpation. No organomegaly or masses detected.  MSK: No erythema, warmth or tenderness. Cap refil <2 sec all extrem. No deformities or joint swelling noted.  Neuro: A/Ox3. No focal deficits noted.   Skin: Warm, no lesions or rashe Psych: Normal affect and behavior. Judgement and thought content appropriate.     Lab Results:  CBC    Component Value Date/Time   WBC 10.2 10/04/2023 1420   RBC 4.68 10/04/2023 1420   HGB 14.6 10/04/2023 1420   HCT 43.9 10/04/2023 1420   PLT 337 10/04/2023 1420   MCV 93.8 10/04/2023 1420   MCH 31.2 10/04/2023 1420   MCHC 33.3 10/04/2023 1420   RDW 16.0 (H) 10/04/2023 1420   LYMPHSABS 2.3 07/06/2023 1952   MONOABS 1.1 (H) 07/06/2023 1952   EOSABS 0.1 07/06/2023 1952   BASOSABS 0.1 07/06/2023 1952    BMET    Component Value Date/Time   NA 140 10/04/2023 1420   K 3.9 10/04/2023 1420   CL 106 10/04/2023 1420   CO2 24 10/04/2023 1420   GLUCOSE 114 (H) 10/04/2023 1420   BUN 12 10/04/2023 1420   CREATININE 0.97 10/04/2023 1420   CREATININE 1.10 (H) 02/11/2016 0921   CALCIUM 9.2 10/04/2023 1420   GFRNONAA >60 10/04/2023 1420   GFRAA >60 03/07/2020 0313    BNP No results found for: "BNP"   Imaging:  No results found.  Administration History     None          Latest Ref Rng & Units 06/11/2023    1:06 PM   PFT Results  FVC-Pre L 2.65   FVC-Predicted Pre % 87   FVC-Post L 2.73   FVC-Predicted Post % 89   Pre FEV1/FVC % % 63   Post FEV1/FCV % % 69   FEV1-Pre L 1.67   FEV1-Predicted Pre % 71   FEV1-Post L 1.88   DLCO uncorrected ml/min/mmHg 18.42   DLCO UNC% % 97   DLCO corrected ml/min/mmHg 18.42   DLCO COR %Predicted % 97   DLVA Predicted % 110   TLC L 4.87   TLC % Predicted % 102   RV % Predicted % 109     No results found for: "NITRICOXIDE"      Assessment & Plan:   No problem-specific Assessment & Plan notes found for this encounter.    I spent 45 minutes of dedicated to the care of this patient on the date of this encounter to include pre-visit review of records, face-to-face time with the patient discussing conditions above, post visit ordering of testing, clinical documentation with the electronic health record, making appropriate referrals as documented, and communicating necessary findings to members of the patients care team.  Noemi Chapel, NP 11/15/2023  Pt aware and understands NP's role.

## 2023-11-20 ENCOUNTER — Ambulatory Visit: Payer: Medicare Other | Admitting: Primary Care

## 2024-01-04 ENCOUNTER — Encounter: Payer: Self-pay | Admitting: Nurse Practitioner

## 2024-01-04 ENCOUNTER — Ambulatory Visit: Admitting: Nurse Practitioner

## 2024-01-04 VITALS — BP 102/70 | HR 62 | Ht 65.5 in | Wt 191.2 lb

## 2024-01-04 DIAGNOSIS — J439 Emphysema, unspecified: Secondary | ICD-10-CM

## 2024-01-04 DIAGNOSIS — G4733 Obstructive sleep apnea (adult) (pediatric): Secondary | ICD-10-CM | POA: Diagnosis not present

## 2024-01-04 DIAGNOSIS — J4489 Other specified chronic obstructive pulmonary disease: Secondary | ICD-10-CM

## 2024-01-04 NOTE — Assessment & Plan Note (Signed)
 Stable without any recent exacerbations.  Continue triple therapy regimen.  Action plan in place.

## 2024-01-04 NOTE — Patient Instructions (Addendum)
 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  Shee denies any discomfort.  Device was set to conventional configuration+/-/+ Programming :  1.  Stimulation level : Sensation 1.4 V , functional level 1.4 V lower limit, upper limit 2.4 V.  He was set at level 1 2.  Start delay 60 minutes, pause time 15 minutes, duration 6 hours 3.  Sensing waveform was analyzed for 3 minutes 4.  Sleep remote education was provided patient demonstrated competency with the remote and was aware of patient Instruction videos and sleep remote guide 5.  Step up levels by 1 level (0.1 V ) every week. You will increase next Friday, 5/23 to level 2 and then every Friday by 1 level after this. If you have any discomfort, you will back down by 1 level, stay there for a week then try to increase again. Do not increase faster than once a week  6.  Check-in visit in presleep study in 4-6 weeks to ensure that they are stepping up levels, using therapy " all night, every night" and to evaluate subjective benefit.   7.  Inspire titration sleep study will be scheduled 2 to 4 weeks after the prestudy visit to verify efficacy of device  Do your neck exercises   Follow up 6/24 at 1130 with Katie Cythia Bachtel,NP. If symptoms do not improve or worsen, please contact office for sooner follow up

## 2024-01-04 NOTE — Assessment & Plan Note (Signed)
 Severe OSA, intolerant of CPAP.  Inspire implanted 10/10/2023.  Delayed activation due to ongoing nerve healing.  Evaluated today with Ginny from [place].  Her sensing lead seems to be a little tight.  Educated to work on neck stretches and tongue exercises.  Otherwise, nerve seems to be well-healed with no difficulties with tongue movement.  She does have a normal slight variation to the left, which is unrelated given she is implanted on the right. Activated today.  Tolerated well.  No discomfort.  She was sent home on a level 1.  Instructed on proper titration.  Remote education was provided.  Understands to utilize nightly or anytime that she is napping.  Aware of risks of untreated sleep apnea.  Safe driving practices reviewed.  Patient Instructions  Hypoglossal nerve stimulator was assessed today.  Shee denies any discomfort.  Device was set to conventional configuration+/-/+ Programming :  1.  Stimulation level : Sensation 1.4 V , functional level 1.4 V lower limit, upper limit 2.4 V.  He was set at level 1 2.  Start delay 60 minutes, pause time 15 minutes, duration 6 hours 3.  Sensing waveform was analyzed for 3 minutes 4.  Sleep remote education was provided patient demonstrated competency with the remote and was aware of patient Instruction videos and sleep remote guide 5.  Step up levels by 1 level (0.1 V ) every week. You will increase next Friday, 5/23 to level 2 and then every Friday by 1 level after this. If you have any discomfort, you will back down by 1 level, stay there for a week then try to increase again. Do not increase faster than once a week  6.  Check-in visit in presleep study in 4-6 weeks to ensure that they are stepping up levels, using therapy " all night, every night" and to evaluate subjective benefit.   7.  Inspire titration sleep study will be scheduled 2 to 4 weeks after the prestudy visit to verify efficacy of device  Do your neck exercises   Follow up 6/24 at 1130  with Katie Kitrina Maurin,NP. If symptoms do not improve or worsen, please contact office for sooner follow up

## 2024-01-04 NOTE — Progress Notes (Signed)
 @Patient  ID: [name], female    DOB: 1960/10/11, 63 y.o.   MRN: [id number]  No chief complaint on file.   Referring provider: Claudell Cruz, MD  HPI: 63 year old female, active smoker followed for COPD with chronic bronchitis and emphysema, and severe OSA treated with Inspire. She was last seen in office 06/19/2023. Past medical history significant for CAD s/p CABG, HTN, hx of NSTEMI, HLD, bipolar disorder, severe OSA, GERD.  TEST/EVENTS:  08/25/2007 PSG: AHI 20.6/h >> optimal CPAP pressure 7 cmH2O 10/14/2022 HST: AHI 33.2/h, SpO2 low 80% 06/11/2023 PFT: FVC 87, FEV1 71, ratio 69, TLC 102, DLCOcor 97. Moderate obstruction with reversibility   03/22/2022: OV with Dr. McQuaid. Coughing up white mucus for years; no change. Recently had a flare up after respiratory infection following exposure to sick kids. Treated with medications but can't remember the names. DOE most of her adult life. Daytime fatigue; snores a lot. Advanced cardiac disease, CAD. Previous CABG. Spirometry showed moderate airflow obstruction. Still smoking, likely contributing to symptoms. Noncompliant with Breo. Will switch to Anoro. Smoking cessation advised. Had CT outside - will request records. Split night sleep study ordered. Follow up 1 week.  05/02/2023: OV with Toron Bowring NP for overdue follow up. Her breathing has been about the same since she was here last. No worse but no better. Not currently on any scheduled inhalers. She wasn't sure the Anoro really helped her. Doesn't remember much about her response to Breo. She does use her albuterol  daily. No recent exacerbations requiring steroids/abx. She has a daily cough with white phlegm, which is unchanged. Notices an occasional wheeze. She does struggle with reflux. She has not been on anything recently. She has this regardless of what she eats but definitely avoids certain trigger foods. Denies fevers, chills, hemoptysis, leg swelling, orthopnea, anorexia, weight loss. No  difficulties swallowing, abd pain, changes in bowel habits, hoarse voice. She is seeing an ENT for chronic sinusitis symptoms. No worse than normal.  She has had a repeat sleep study since she was here last. She has severe sleep apnea with AHI 33.2/h from [facility] 10/14/2022. She has significant daytime fatigue and restless sleep at night. Never feels rested. She was originally diagnosed with OSA in 2009. She was on CPAP but could not tolerate it even after a CPAP desensitization study. She tried multiple masks and setting adjustments. She has PTSD and is just unable to wear something on her face at night. She feels like her symptoms have gotten worse over the past few years. She is interested in [place] device.   06/19/2023: OV with Dr. Gaynell Keeler.  Recently started on Breztri .  Symptoms are much better with Breztri  use.  Has not used albuterol  in the last month.  Very active.  Down to smoking about a pack a month.  Has a history of OSA, awaiting inspire device implantation.  Overall doing well.  01/04/2024: Today -inspire activation Patient presents today for inspire activation.  She had come in in March, 6 weeks postop, but implantation was delayed due to ongoing nerve healing.  Her inspire was implanted 10/10/2023 by Dr. Soldatova.  Since the end of March, she has had significant improvement.  Not having any more pain or tenderness to the right side.  Still does have some tightness when she moves her neck a certain way or stretches her tongue up to her nose but no discomfort.  Swallowing and speech are normal.  Incision sites have healed well. She continues to have  ongoing issues with her sleep.  She has never felt rested.  Has significant daytime fatigue and restless sleep.  Has been told that she snores in the past.  Denies any drowsy driving or morning headaches.  No sleep parasomnia/paralysis. Breathing stable.  Still using Breztri .  No increased cough or wheezing.  Ginny with Inspire present  Allergies   Allergen Reactions   Benadryl  [Diphenhydramine  Hcl] Shortness Of Breath and Swelling    Tongue swelling   Codeine Other (See Comments)    "makes me psychotic"    Haldol [Haloperidol Decanoate] Anaphylaxis   Amoxicillin Hives and Swelling    Swelling to extremities Did it involve swelling of the face/tongue/throat, SOB, or low BP? Yes Did it involve sudden or severe rash/hives, skin peeling, or any reaction on the inside of your mouth or nose? Yes Did you need to seek medical attention at a hospital or doctor's office? Yes When did it last happen?    young adult   If all above answers are "NO", may proceed with cephalosporin use.   Bee Venom Swelling and Other (See Comments)    "pimples" from head to toe and whole body swelling    Immunization History  Administered Date(s) Administered   Influenza, Seasonal, Injecte, Preservative Fre 06/19/2023   PNEUMOCOCCAL CONJUGATE-20 04/27/2023    Past Medical History:  Diagnosis Date   Allergy    Anemia    takes iron supplement   Arthritis    knees, shoulders   Asthma    Bipolar 1 disorder (HCC)    Blood transfusion without reported diagnosis    Cataract, immature    CHF (congestive heart failure) (HCC)    Clotting disorder (HCC)    she denied any specific genetic bleeding/clotting diagnosis (10/05/23)   COPD (chronic obstructive pulmonary disease) (HCC)    Coronary artery disease    triple vessel   Depression    Dyspnea    Family history of adverse reaction to anesthesia    states father "did not do well" with anesthesia; died last time he had surgery; also had heart disease   Gallstones    GERD (gastroesophageal reflux disease)    occasional - no current med.   Glaucoma    Hammertoe 05/2015   right second toe   Heart murmur    Hepatic steatosis    High cholesterol    History of MI (myocardial infarction)    Hypertension    states under control with meds., has been on med. x 8 yr.   Myocardial infarction (HCC)     Osteoporosis    Panic attacks    Peripheral vascular disease (HCC)    carotid   Renal cyst, left    Schizophrenia (HCC)    Short of breath on exertion    Sleep apnea    Inspire in future   Urinary, incontinence, stress female    Wears dentures    upper   Wears partial dentures    lower    Tobacco History: Social History   Tobacco Use  Smoking Status Some Days   Current packs/day: 2.00   Average packs/day: 2.0 packs/day for 38.4 years (76.7 ttl pk-yrs)   Types: Cigarettes   Start date: 08/21/1985  Smokeless Tobacco Never  Tobacco Comments   10/23/2023 patient smoke a pack of cigarettes a month   Ready to quit: Not Answered Counseling given: Not Answered Tobacco comments: 10/23/2023 patient smoke a pack of cigarettes a month   Outpatient Medications Prior to Visit  Medication  Sig Dispense Refill   acetaminophen  (TYLENOL ) 500 MG tablet Take 1 tablet (500 mg total) by mouth every 6 (six) hours as needed. Please take every 6 hrs and stagger with Motrin  3 hrs apart from each other. Please do not exceed maximum daily dose to avoid liver damage 30 tablet 1   albuterol  (PROVENTIL  HFA;VENTOLIN  HFA) 108 (90 BASE) MCG/ACT inhaler Inhale 2 puffs into the lungs every 6 (six) hours as needed for wheezing or shortness of breath.      Alirocumab  (PRALUENT ) 150 MG/ML SOAJ Inject 1 mL (150 mg total) into the skin every 14 (fourteen) days. 6 mL 6   amLODipine  (NORVASC ) 5 MG tablet Take 1 tablet (5 mg total) by mouth daily. 90 tablet 3   Ascorbic Acid  (VITAMIN C  PO) Take 1 tablet by mouth daily.     aspirin  81 MG chewable tablet Chew 81 mg by mouth in the morning and at bedtime.     atorvastatin  (LIPITOR ) 80 MG tablet Take 1 tablet (80 mg total) by mouth daily. 90 tablet 1   brimonidine  (ALPHAGAN ) 0.15 % ophthalmic solution Place 1 drop into both eyes 2 (two) times daily.      Budeson-Glycopyrrol-Formoterol  (BREZTRI  AEROSPHERE) 160-9-4.8 MCG/ACT AERO Inhale 2 puffs into the lungs in the morning  and at bedtime. 10.7 each 5   doxycycline  (VIBRA -TABS) 100 MG tablet Take 1 tablet (100 mg total) by mouth 2 (two) times daily. 20 tablet 0   escitalopram  (LEXAPRO ) 20 MG tablet Take 20 mg by mouth daily.     fluticasone  (FLONASE ) 50 MCG/ACT nasal spray Place 2 sprays into both nostrils daily. (Patient taking differently: Place 2 sprays into both nostrils daily as needed for allergies.) 16 g 6   ibuprofen  (ADVIL ) 600 MG tablet Take 1 tablet (600 mg total) by mouth every 6 (six) hours. Please take every 6 hrs, and stagger this medication 3 hrs apart from Tylenol  30 tablet 0   icosapent  Ethyl (VASCEPA ) 1 g capsule TAKE 2 CAPSULES BY MOUTH 2 TIMES DAILY. 120 capsule 3   isosorbide  mononitrate (IMDUR ) 30 MG 24 hr tablet TAKE 3 TABLETS BY MOUTH DAILY. 270 tablet 3   lamoTRIgine  (LAMICTAL ) 100 MG tablet Take 100 mg by mouth daily.     metoprolol  tartrate (LOPRESSOR ) 25 MG tablet TAKE 1/2 TABLET BY MOUTH TWICE A DAY 90 tablet 3   niacin  (NIASPAN ) 1000 MG CR tablet TAKE 1 TABLET (1,000 MG TOTAL) BY MOUTH AT BEDTIME. 90 tablet 3   nitroGLYCERIN  (NITROSTAT ) 0.4 MG SL tablet Place 1 tablet (0.4 mg total) under the tongue every 5 (five) minutes as needed for chest pain. 25 tablet 3   ondansetron  (ZOFRAN -ODT) 4 MG disintegrating tablet Take 1 tablet (4 mg total) by mouth every 8 (eight) hours as needed for nausea or vomiting. 12 tablet 0   oxyCODONE  (ROXICODONE ) 5 MG immediate release tablet Take 1 tablet (5 mg total) by mouth every 6 (six) hours as needed for severe pain (pain score 7-10) (for postop pain). 20 tablet 0   pantoprazole  (PROTONIX ) 40 MG tablet TAKE 1 TABLET BY MOUTH EVERY DAY 90 tablet 3   QUEtiapine  Fumarate 150 MG TABS Take 150 mg by mouth at bedtime.     ranolazine  (RANEXA ) 500 MG 12 hr tablet TAKE 1 TABLET BY MOUTH TWICE A DAY (Patient taking differently: Take by mouth 2 (two) times daily. 1 gram twice daily) 180 tablet 2   VITAMIN E PO Take 1 tablet by mouth daily.     No  facility-administered medications prior to visit.     Review of Systems:   Constitutional: No weight loss or gain, night sweats, fevers, chills, or lassitude. +fatigue  HEENT: No difficulty swallowing, tooth/dental problems, or sore throat. No sneezing, itching, ear ache, nasal congestion, post nasal drip +headaches (AM) CV:  No chest pain, orthopnea, PND, swelling in lower extremities, anasarca, dizziness, palpitations, syncope Resp: +shortness of breath with exertion; daily productive cough; snoring; witnessed nocturnal apneas. No excess mucus or change in color of mucus. No hemoptysis. No chest wall deformity GI:  +heartburn, indigestion (baseline). No abdominal pain, nausea, vomiting, diarrhea, change in bowel habits, loss of appetite, bloody stools.  GU: No dysuria, change in color of urine, urgency or frequency. Skin: No rash, lesions, ulcerations MSK:  No joint pain or swelling.   Neuro: No dizziness or lightheadedness.  Psych: Mood stable. +sleep disturbance     Physical Exam:  BP 102/70 (BP Location: Right Arm, Cuff Size: Normal)   Pulse 62   Ht 5' 5.5" (1.664 m)   Wt 191 lb 3.2 oz (86.7 kg)   LMP 11/16/2011   SpO2 100%   BMI 31.33 kg/m   GEN: Pleasant, interactive, well-appearing; obese; in no acute distress. HEENT:  Normocephalic and atraumatic. PERRLA. Sclera white. Nasal turbinates pink, moist and patent bilaterally. No rhinorrhea present. Oropharynx pink and moist, without exudate or edema. No lesions, ulcerations, or postnasal drip. Mallampati III.  Tongue protrusion strong with slight deviation to the left, normal anatomical variant. NECK:  Supple w/ fair ROM. No JVD present. Normal carotid impulses w/o bruits. Thyroid  symmetrical with no goiter or nodules palpated. No lymphadenopathy.   CV: RRR, no m/r/g, no peripheral edema. Pulses intact, +2 bilaterally. No cyanosis, pallor or clubbing. PULMONARY:  Unlabored, regular breathing. Clear bilaterally A&P w/o  wheezes/rales/rhonchi. No accessory muscle use.  GI: BS present and normoactive. Soft, non-tender to palpation. No organomegaly or masses detected.  MSK: No erythema, warmth or tenderness. Cap refil <2 sec all extrem. No deformities or joint swelling noted.  Neuro: A/Ox3. No focal deficits noted.   Skin: Warm, no lesions or rashes.  Approximated and well-healed right neck incision and right chest incision Psych: Normal affect and behavior. Judgement and thought content appropriate.     Lab Results:  CBC    Component Value Date/Time   WBC 10.2 10/04/2023 1420   RBC 4.68 10/04/2023 1420   HGB 14.6 10/04/2023 1420   HCT 43.9 10/04/2023 1420   PLT 337 10/04/2023 1420   MCV 93.8 10/04/2023 1420   MCH 31.2 10/04/2023 1420   MCHC 33.3 10/04/2023 1420   RDW 16.0 (H) 10/04/2023 1420   LYMPHSABS 2.3 07/06/2023 1952   MONOABS 1.1 (H) 07/06/2023 1952   EOSABS 0.1 07/06/2023 1952   BASOSABS 0.1 07/06/2023 1952    BMET    Component Value Date/Time   NA 140 10/04/2023 1420   K 3.9 10/04/2023 1420   CL 106 10/04/2023 1420   CO2 24 10/04/2023 1420   GLUCOSE 114 (H) 10/04/2023 1420   BUN 12 10/04/2023 1420   CREATININE 0.97 10/04/2023 1420   CREATININE 1.10 (H) 02/11/2016 0921   CALCIUM  9.2 10/04/2023 1420   GFRNONAA >60 10/04/2023 1420   GFRAA >60 03/07/2020 0313    BNP No results found for: "BNP"   Imaging:  No results found.  Administration History     None          Latest Ref Rng & Units 06/11/2023    1:06 PM  PFT Results  FVC-Pre L 2.65   FVC-Predicted Pre % 87   FVC-Post L 2.73   FVC-Predicted Post % 89   Pre FEV1/FVC % % 63   Post FEV1/FCV % % 69   FEV1-Pre L 1.67   FEV1-Predicted Pre % 71   FEV1-Post L 1.88   DLCO uncorrected ml/min/mmHg 18.42   DLCO UNC% % 97   DLCO corrected ml/min/mmHg 18.42   DLCO COR %Predicted % 97   DLVA Predicted % 110   TLC L 4.87   TLC % Predicted % 102   RV % Predicted % 109     No results found for:  "NITRICOXIDE"      Assessment & Plan:   OSA (obstructive sleep apnea) Severe OSA, intolerant of CPAP.  Inspire implanted 10/10/2023.  Delayed activation due to ongoing nerve healing.  Evaluated today with Ginny from [place].  Her sensing lead seems to be a little tight.  Educated to work on neck stretches and tongue exercises.  Otherwise, nerve seems to be well-healed with no difficulties with tongue movement.  She does have a normal slight variation to the left, which is unrelated given she is implanted on the right. Activated today.  Tolerated well.  No discomfort.  She was sent home on a level 1.  Instructed on proper titration.  Remote education was provided.  Understands to utilize nightly or anytime that she is napping.  Aware of risks of untreated sleep apnea.  Safe driving practices reviewed.  Patient Instructions  Hypoglossal nerve stimulator was assessed today.  Shee denies any discomfort.  Device was set to conventional configuration+/-/+ Programming :  1.  Stimulation level : Sensation 1.4 V , functional level 1.4 V lower limit, upper limit 2.4 V.  He was set at level 1 2.  Start delay 60 minutes, pause time 15 minutes, duration 6 hours 3.  Sensing waveform was analyzed for 3 minutes 4.  Sleep remote education was provided patient demonstrated competency with the remote and was aware of patient Instruction videos and sleep remote guide 5.  Step up levels by 1 level (0.1 V ) every week. You will increase next Friday, 5/23 to level 2 and then every Friday by 1 level after this. If you have any discomfort, you will back down by 1 level, stay there for a week then try to increase again. Do not increase faster than once a week  6.  Check-in visit in presleep study in 4-6 weeks to ensure that they are stepping up levels, using therapy " all night, every night" and to evaluate subjective benefit.   7.  Inspire titration sleep study will be scheduled 2 to 4 weeks after the prestudy visit to  verify efficacy of device  Do your neck exercises   Follow up 6/24 at 1130 with Katie Havier Deeb,NP. If symptoms do not improve or worsen, please contact office for sooner follow up    COPD with chronic bronchitis and emphysema (HCC) Stable without any recent exacerbations.  Continue triple therapy regimen.  Action plan in place.    I spent 45 minutes of dedicated to the care of this patient on the date of this encounter to include pre-visit review of records, face-to-face time with the patient discussing conditions above, post visit ordering of testing, clinical documentation with the electronic health record, making appropriate referrals as documented, and communicating necessary findings to members of the patients care team.  Roetta Clarke, NP 01/04/2024  Pt aware and understands NP's role.

## 2024-01-06 ENCOUNTER — Other Ambulatory Visit: Payer: Self-pay

## 2024-01-06 ENCOUNTER — Emergency Department (HOSPITAL_COMMUNITY)

## 2024-01-06 ENCOUNTER — Inpatient Hospital Stay (HOSPITAL_COMMUNITY)
Admission: EM | Admit: 2024-01-06 | Discharge: 2024-01-08 | DRG: 811 | Disposition: A | Discharge status: Home / Self Care | Attending: Internal Medicine | Admitting: Internal Medicine

## 2024-01-06 ENCOUNTER — Inpatient Hospital Stay (HOSPITAL_COMMUNITY)

## 2024-01-06 ENCOUNTER — Encounter (HOSPITAL_COMMUNITY): Payer: Self-pay | Admitting: Emergency Medicine

## 2024-01-06 DIAGNOSIS — N179 Acute kidney failure, unspecified: Secondary | ICD-10-CM | POA: Diagnosis present

## 2024-01-06 DIAGNOSIS — Z7982 Long term (current) use of aspirin: Secondary | ICD-10-CM

## 2024-01-06 DIAGNOSIS — J4489 Other specified chronic obstructive pulmonary disease: Secondary | ICD-10-CM | POA: Diagnosis present

## 2024-01-06 DIAGNOSIS — D75838 Other thrombocytosis: Secondary | ICD-10-CM | POA: Diagnosis present

## 2024-01-06 DIAGNOSIS — G4733 Obstructive sleep apnea (adult) (pediatric): Secondary | ICD-10-CM | POA: Diagnosis present

## 2024-01-06 DIAGNOSIS — E861 Hypovolemia: Secondary | ICD-10-CM | POA: Diagnosis present

## 2024-01-06 DIAGNOSIS — K2980 Duodenitis without bleeding: Secondary | ICD-10-CM | POA: Diagnosis present

## 2024-01-06 DIAGNOSIS — E872 Acidosis, unspecified: Secondary | ICD-10-CM | POA: Diagnosis present

## 2024-01-06 DIAGNOSIS — M810 Age-related osteoporosis without current pathological fracture: Secondary | ICD-10-CM | POA: Diagnosis present

## 2024-01-06 DIAGNOSIS — T45525A Adverse effect of antithrombotic drugs, initial encounter: Secondary | ICD-10-CM | POA: Diagnosis present

## 2024-01-06 DIAGNOSIS — F319 Bipolar disorder, unspecified: Secondary | ICD-10-CM | POA: Diagnosis present

## 2024-01-06 DIAGNOSIS — K259 Gastric ulcer, unspecified as acute or chronic, without hemorrhage or perforation: Secondary | ICD-10-CM | POA: Diagnosis not present

## 2024-01-06 DIAGNOSIS — I110 Hypertensive heart disease with heart failure: Secondary | ICD-10-CM | POA: Diagnosis present

## 2024-01-06 DIAGNOSIS — E7800 Pure hypercholesterolemia, unspecified: Secondary | ICD-10-CM | POA: Diagnosis present

## 2024-01-06 DIAGNOSIS — K760 Fatty (change of) liver, not elsewhere classified: Secondary | ICD-10-CM | POA: Diagnosis present

## 2024-01-06 DIAGNOSIS — F419 Anxiety disorder, unspecified: Secondary | ICD-10-CM | POA: Diagnosis present

## 2024-01-06 DIAGNOSIS — K2990 Gastroduodenitis, unspecified, without bleeding: Secondary | ICD-10-CM | POA: Diagnosis present

## 2024-01-06 DIAGNOSIS — R112 Nausea with vomiting, unspecified: Secondary | ICD-10-CM | POA: Diagnosis not present

## 2024-01-06 DIAGNOSIS — Z803 Family history of malignant neoplasm of breast: Secondary | ICD-10-CM

## 2024-01-06 DIAGNOSIS — H409 Unspecified glaucoma: Secondary | ICD-10-CM | POA: Diagnosis present

## 2024-01-06 DIAGNOSIS — K2289 Other specified disease of esophagus: Secondary | ICD-10-CM | POA: Diagnosis present

## 2024-01-06 DIAGNOSIS — Z8051 Family history of malignant neoplasm of kidney: Secondary | ICD-10-CM

## 2024-01-06 DIAGNOSIS — Z951 Presence of aortocoronary bypass graft: Secondary | ICD-10-CM

## 2024-01-06 DIAGNOSIS — Z6830 Body mass index (BMI) 30.0-30.9, adult: Secondary | ICD-10-CM | POA: Diagnosis not present

## 2024-01-06 DIAGNOSIS — F17210 Nicotine dependence, cigarettes, uncomplicated: Secondary | ICD-10-CM | POA: Diagnosis present

## 2024-01-06 DIAGNOSIS — I2510 Atherosclerotic heart disease of native coronary artery without angina pectoris: Secondary | ICD-10-CM | POA: Diagnosis present

## 2024-01-06 DIAGNOSIS — Z885 Allergy status to narcotic agent status: Secondary | ICD-10-CM

## 2024-01-06 DIAGNOSIS — I10 Essential (primary) hypertension: Secondary | ICD-10-CM | POA: Diagnosis not present

## 2024-01-06 DIAGNOSIS — K254 Chronic or unspecified gastric ulcer with hemorrhage: Secondary | ICD-10-CM | POA: Diagnosis present

## 2024-01-06 DIAGNOSIS — R9431 Abnormal electrocardiogram [ECG] [EKG]: Secondary | ICD-10-CM | POA: Diagnosis present

## 2024-01-06 DIAGNOSIS — E860 Dehydration: Secondary | ICD-10-CM | POA: Diagnosis present

## 2024-01-06 DIAGNOSIS — E8729 Other acidosis: Secondary | ICD-10-CM

## 2024-01-06 DIAGNOSIS — K922 Gastrointestinal hemorrhage, unspecified: Secondary | ICD-10-CM | POA: Diagnosis present

## 2024-01-06 DIAGNOSIS — Z8249 Family history of ischemic heart disease and other diseases of the circulatory system: Secondary | ICD-10-CM

## 2024-01-06 DIAGNOSIS — Z9049 Acquired absence of other specified parts of digestive tract: Secondary | ICD-10-CM

## 2024-01-06 DIAGNOSIS — D62 Acute posthemorrhagic anemia: Secondary | ICD-10-CM | POA: Diagnosis present

## 2024-01-06 DIAGNOSIS — I509 Heart failure, unspecified: Secondary | ICD-10-CM | POA: Diagnosis present

## 2024-01-06 DIAGNOSIS — K219 Gastro-esophageal reflux disease without esophagitis: Secondary | ICD-10-CM | POA: Diagnosis present

## 2024-01-06 DIAGNOSIS — E871 Hypo-osmolality and hyponatremia: Secondary | ICD-10-CM

## 2024-01-06 DIAGNOSIS — I739 Peripheral vascular disease, unspecified: Secondary | ICD-10-CM | POA: Diagnosis present

## 2024-01-06 DIAGNOSIS — R109 Unspecified abdominal pain: Secondary | ICD-10-CM | POA: Diagnosis present

## 2024-01-06 DIAGNOSIS — I252 Old myocardial infarction: Secondary | ICD-10-CM

## 2024-01-06 DIAGNOSIS — E669 Obesity, unspecified: Secondary | ICD-10-CM | POA: Diagnosis present

## 2024-01-06 DIAGNOSIS — R821 Myoglobinuria: Secondary | ICD-10-CM | POA: Diagnosis present

## 2024-01-06 DIAGNOSIS — K2970 Gastritis, unspecified, without bleeding: Secondary | ICD-10-CM | POA: Diagnosis not present

## 2024-01-06 DIAGNOSIS — D72829 Elevated white blood cell count, unspecified: Secondary | ICD-10-CM | POA: Diagnosis present

## 2024-01-06 DIAGNOSIS — Z955 Presence of coronary angioplasty implant and graft: Secondary | ICD-10-CM

## 2024-01-06 DIAGNOSIS — R7303 Prediabetes: Secondary | ICD-10-CM | POA: Diagnosis present

## 2024-01-06 DIAGNOSIS — Z96651 Presence of right artificial knee joint: Secondary | ICD-10-CM | POA: Diagnosis present

## 2024-01-06 DIAGNOSIS — R739 Hyperglycemia, unspecified: Secondary | ICD-10-CM | POA: Diagnosis present

## 2024-01-06 DIAGNOSIS — Z880 Allergy status to penicillin: Secondary | ICD-10-CM

## 2024-01-06 DIAGNOSIS — E876 Hypokalemia: Secondary | ICD-10-CM

## 2024-01-06 DIAGNOSIS — Z7902 Long term (current) use of antithrombotics/antiplatelets: Secondary | ICD-10-CM | POA: Diagnosis not present

## 2024-01-06 DIAGNOSIS — Z801 Family history of malignant neoplasm of trachea, bronchus and lung: Secondary | ICD-10-CM

## 2024-01-06 DIAGNOSIS — K2210 Ulcer of esophagus without bleeding: Secondary | ICD-10-CM | POA: Diagnosis not present

## 2024-01-06 DIAGNOSIS — Z79899 Other long term (current) drug therapy: Secondary | ICD-10-CM

## 2024-01-06 DIAGNOSIS — K449 Diaphragmatic hernia without obstruction or gangrene: Secondary | ICD-10-CM | POA: Diagnosis not present

## 2024-01-06 DIAGNOSIS — K921 Melena: Secondary | ICD-10-CM | POA: Diagnosis not present

## 2024-01-06 DIAGNOSIS — R197 Diarrhea, unspecified: Secondary | ICD-10-CM | POA: Diagnosis present

## 2024-01-06 DIAGNOSIS — R1084 Generalized abdominal pain: Principal | ICD-10-CM

## 2024-01-06 DIAGNOSIS — Z888 Allergy status to other drugs, medicaments and biological substances status: Secondary | ICD-10-CM

## 2024-01-06 LAB — URINALYSIS, ROUTINE W REFLEX MICROSCOPIC
Bilirubin Urine: NEGATIVE
Glucose, UA: NEGATIVE mg/dL
Ketones, ur: NEGATIVE mg/dL
Leukocytes,Ua: NEGATIVE
Nitrite: NEGATIVE
Protein, ur: NEGATIVE mg/dL
Specific Gravity, Urine: 1.005 (ref 1.005–1.030)
pH: 5 (ref 5.0–8.0)

## 2024-01-06 LAB — CBC
HCT: 22.9 % — ABNORMAL LOW (ref 36.0–46.0)
Hemoglobin: 7.7 g/dL — ABNORMAL LOW (ref 12.0–15.0)
MCH: 31.6 pg (ref 26.0–34.0)
MCHC: 33.6 g/dL (ref 30.0–36.0)
MCV: 93.9 fL (ref 80.0–100.0)
Platelets: 456 10*3/uL — ABNORMAL HIGH (ref 150–400)
RBC: 2.44 MIL/uL — ABNORMAL LOW (ref 3.87–5.11)
RDW: 14.5 % (ref 11.5–15.5)
WBC: 19.5 10*3/uL — ABNORMAL HIGH (ref 4.0–10.5)
nRBC: 0.7 % — ABNORMAL HIGH (ref 0.0–0.2)

## 2024-01-06 LAB — COMPREHENSIVE METABOLIC PANEL WITH GFR
ALT: 19 U/L (ref 0–44)
AST: 29 U/L (ref 15–41)
Albumin: 3.7 g/dL (ref 3.5–5.0)
Alkaline Phosphatase: 123 U/L (ref 38–126)
Anion gap: 17 — ABNORMAL HIGH (ref 5–15)
BUN: 53 mg/dL — ABNORMAL HIGH (ref 8–23)
CO2: 19 mmol/L — ABNORMAL LOW (ref 22–32)
Calcium: 8.8 mg/dL — ABNORMAL LOW (ref 8.9–10.3)
Chloride: 94 mmol/L — ABNORMAL LOW (ref 98–111)
Creatinine, Ser: 2.08 mg/dL — ABNORMAL HIGH (ref 0.44–1.00)
GFR, Estimated: 26 mL/min — ABNORMAL LOW (ref >60)
Glucose, Bld: 152 mg/dL — ABNORMAL HIGH (ref 70–99)
Potassium: 3.1 mmol/L — ABNORMAL LOW (ref 3.5–5.1)
Sodium: 130 mmol/L — ABNORMAL LOW (ref 135–145)
Total Bilirubin: 0.7 mg/dL (ref 0.0–1.2)
Total Protein: 6.9 g/dL (ref 6.5–8.1)

## 2024-01-06 LAB — CBG MONITORING, ED: Glucose-Capillary: 179 mg/dL — ABNORMAL HIGH (ref 70–99)

## 2024-01-06 LAB — POC OCCULT BLOOD, ED: Fecal Occult Bld: POSITIVE — AB

## 2024-01-06 LAB — LIPASE, BLOOD: Lipase: 51 U/L (ref 11–51)

## 2024-01-06 MED ORDER — BUDESON-GLYCOPYRROL-FORMOTEROL 160-9-4.8 MCG/ACT IN AERO
2.0000 | INHALATION_SPRAY | Freq: Two times a day (BID) | RESPIRATORY_TRACT | Status: DC
  Administered 2024-01-07 – 2024-01-08 (×3): 2 via RESPIRATORY_TRACT
  Filled 2024-01-06: qty 5.9

## 2024-01-06 MED ORDER — LACTATED RINGERS IV BOLUS
1000.0000 mL | Freq: Once | INTRAVENOUS | Status: AC
  Administered 2024-01-06: 1000 mL via INTRAVENOUS

## 2024-01-06 MED ORDER — FENTANYL CITRATE PF 50 MCG/ML IJ SOSY
50.0000 ug | PREFILLED_SYRINGE | Freq: Once | INTRAMUSCULAR | Status: AC
  Administered 2024-01-06: 50 ug via INTRAVENOUS
  Filled 2024-01-06: qty 1

## 2024-01-06 MED ORDER — SODIUM CHLORIDE 0.9 % IV SOLN: INTRAVENOUS | Status: DC

## 2024-01-06 MED ORDER — POTASSIUM CHLORIDE 10 MEQ/100ML IV SOLN: 10.0000 meq | INTRAVENOUS | Status: AC

## 2024-01-06 MED ORDER — SODIUM CHLORIDE 0.9% IV SOLUTION: Freq: Once | INTRAVENOUS | Status: AC

## 2024-01-06 MED ORDER — PANTOPRAZOLE SODIUM 40 MG IV SOLR
40.0000 mg | Freq: Two times a day (BID) | INTRAVENOUS | Status: DC
Start: 2024-01-07 — End: 2024-01-07
  Administered 2024-01-07: 40 mg via INTRAVENOUS
  Filled 2024-01-06: qty 10

## 2024-01-06 MED ORDER — ACETAMINOPHEN 325 MG PO TABS
650.0000 mg | ORAL_TABLET | Freq: Four times a day (QID) | ORAL | Status: DC | PRN
  Administered 2024-01-07 – 2024-01-08 (×3): 650 mg via ORAL
  Filled 2024-01-06 (×3): qty 2

## 2024-01-06 MED ORDER — SODIUM CHLORIDE 0.9 % IV BOLUS
1000.0000 mL | Freq: Once | INTRAVENOUS | Status: AC
  Administered 2024-01-06: 1000 mL via INTRAVENOUS

## 2024-01-06 MED ORDER — ACETAMINOPHEN 650 MG RE SUPP: 650.0000 mg | Freq: Four times a day (QID) | RECTAL | Status: DC | PRN

## 2024-01-06 MED ORDER — ESCITALOPRAM OXALATE 20 MG PO TABS
20.0000 mg | ORAL_TABLET | Freq: Every day | ORAL | Status: DC
  Administered 2024-01-07 – 2024-01-08 (×2): 20 mg via ORAL
  Filled 2024-01-06 (×2): qty 2

## 2024-01-06 MED ORDER — METOCLOPRAMIDE HCL 5 MG/ML IJ SOLN
10.0000 mg | Freq: Once | INTRAMUSCULAR | Status: AC
  Administered 2024-01-06: 10 mg via INTRAVENOUS
  Filled 2024-01-06: qty 2

## 2024-01-06 MED ORDER — ATORVASTATIN CALCIUM 40 MG PO TABS
80.0000 mg | ORAL_TABLET | Freq: Every day | ORAL | Status: DC
  Administered 2024-01-07 – 2024-01-08 (×2): 80 mg via ORAL
  Filled 2024-01-06 (×2): qty 2

## 2024-01-06 MED ORDER — ALBUTEROL SULFATE (2.5 MG/3ML) 0.083% IN NEBU: 3.0000 mL | INHALATION_SOLUTION | Freq: Four times a day (QID) | RESPIRATORY_TRACT | Status: DC | PRN

## 2024-01-06 MED ORDER — TRIMETHOBENZAMIDE HCL 100 MG/ML IM SOLN
200.0000 mg | Freq: Four times a day (QID) | INTRAMUSCULAR | Status: DC | PRN
  Administered 2024-01-07: 200 mg via INTRAMUSCULAR
  Filled 2024-01-06 (×2): qty 2

## 2024-01-06 MED ORDER — LAMOTRIGINE 100 MG PO TABS
100.0000 mg | ORAL_TABLET | Freq: Every day | ORAL | Status: DC
  Administered 2024-01-07 – 2024-01-08 (×2): 100 mg via ORAL
  Filled 2024-01-06 (×2): qty 1

## 2024-01-06 MED ORDER — PANTOPRAZOLE SODIUM 40 MG IV SOLR
40.0000 mg | Freq: Once | INTRAVENOUS | Status: AC
  Administered 2024-01-06: 40 mg via INTRAVENOUS
  Filled 2024-01-06: qty 10

## 2024-01-06 MED ORDER — QUETIAPINE FUMARATE 50 MG PO TABS
150.0000 mg | ORAL_TABLET | Freq: Every day | ORAL | Status: DC
  Administered 2024-01-07: 150 mg via ORAL
  Filled 2024-01-06: qty 6
  Filled 2024-01-06: qty 3

## 2024-01-06 NOTE — ED Triage Notes (Signed)
 Pt bib [facility] from home for abdominal pain since Wednesday vomiting yesterday, today diarrhea black in color looked like coffee ground emesis.  Complaints of back pain as well.  Anxiety noted.

## 2024-01-06 NOTE — H&P (Signed)
 History and Physical    Patient: [name]:[id number] DOB: 11-Nov-1960 DOA: 01/06/2024 DOS: the patient was seen and examined on 01/06/2024 PCP: Claudell Cruz, MD  Patient coming from: Home  Chief Complaint:  Chief Complaint  Patient presents with   Abdominal Pain   HPI: [name] is a 63 y.o. female with medical history significant of iron deficiency anemia, CAD s/p PCI, COPD, HTN, HLD, GERD, obesity, OSA, depression, anxiety, bipolar 1 disorder, PVD, hx of MI presenting to the ED with abdominal pain.   Patient reports that she was in her usual state of health until this past Friday.  States that Friday night she began having nausea and multiple episodes of nonbilious nonbloody emesis.  On Saturday, she had 1 episode of a fully formed black-colored stool.  Following this, she had multiple (greater than 7) episodes of black-colored loose stools throughout the day.  She continued to experience nausea and NBNB emesis yesterday as well.  Given persistence, she came into the ED for further evaluation and care.  She denies any fevers, chills, chest pain, palpitations, constipation, urinary changes.  Does state that she has been unable to eat or drink much since yesterday given ongoing nausea and vomiting.  She also endorses associated shortness of breath with exertion beginning yesterday.  ED course: Vital signs stable. CBC with leukocytosis to 19.5, hgb 7.7 (down from 14.6 about 3 months ago), reactive thrombocytosis. CMP with sodium 130, K 3.1, bicarbonate 19, glucose 152, Cr 2.08, BUN 53, AG 17. Lipase normal. FOBT positive. Urinalysis with myoglobinuria. EKG with sinus rhythm, LVH, and prolonged Qtc ( ). CT abd/pel without contrast showing no acute abnormalities. ED provider consulted GI who recommended medical admission and will formally see tomorrow. Patient given 2L IVF bolus, IV PPI, and IV reglan  in the ED. TRH asked to evaluate patient for admission.  Review of Systems:  As mentioned in the history of present illness. All other systems reviewed and are negative. Past Medical History:  Diagnosis Date   Allergy    Anemia    takes iron supplement   Arthritis    knees, shoulders   Asthma    Bipolar 1 disorder (HCC)    Blood transfusion without reported diagnosis    Cataract, immature    CHF (congestive heart failure) (HCC)    Clotting disorder (HCC)    she denied any specific genetic bleeding/clotting diagnosis (10/05/23)   COPD (chronic obstructive pulmonary disease) (HCC)    Coronary artery disease    triple vessel   Depression    Dyspnea    Family history of adverse reaction to anesthesia    states father "did not do well" with anesthesia; died last time he had surgery; also had heart disease   Gallstones    GERD (gastroesophageal reflux disease)    occasional - no current med.   Glaucoma    Hammertoe 05/2015   right second toe   Heart murmur    Hepatic steatosis    High cholesterol    History of MI (myocardial infarction)    Hypertension    states under control with meds., has been on med. x 8 yr.   Myocardial infarction [facility])    Osteoporosis    Panic attacks    Peripheral vascular disease (HCC)    carotid   Renal cyst, left    Schizophrenia (HCC)    Short of breath on exertion    Sleep apnea    Inspire in future   Urinary,  incontinence, stress female    Wears dentures    upper   Wears partial dentures    lower   Past Surgical History:  Procedure Laterality Date   C-SECTIONS X 2  1985, 1988   CARDIAC CATHETERIZATION  12/10/2006; 12/18/2006; 07/10/2007; 07/19/2009   CARDIAC CATHETERIZATION  03/04/2010   patent grafts, normal LV function, diffusely diseased RCA (Dr. Aleda Ammon)   CARDIAC CATHETERIZATION N/A 02/15/2016   Procedure: Left Heart Cath and Cors/Grafts Angiography;  Surgeon: Peter M Swaziland, MD;  Location: [facility] INVASIVE CV LAB;  Service: Cardiovascular;  Laterality: N/A;   CHOLECYSTECTOMY N/A 06/29/2023   Procedure:  LAPAROSCOPIC CHOLECYSTECTOMY WITH ICG DYE;  Surgeon: Aldean Hummingbird, MD;  Location: [facility] ORS;  Service: General;  Laterality: N/A;   CLUB FOOT RELEASE Bilateral    1st and 2nd toes   COLONOSCOPY     CORONARY ARTERY BYPASS GRAFT  07/16/2007   LIMA to LAD, VG to ramus, VG to PDA    CORONARY STENT INTERVENTION N/A 06/23/2019   Procedure: CORONARY STENT INTERVENTION;  Surgeon: Avanell Leigh, MD;  Location: [facility] INVASIVE CV LAB;  Service: Cardiovascular;  Laterality: N/A;   DRUG INDUCED ENDOSCOPY N/A 05/17/2023   Procedure: DRUG INDUCED ENDOSCOPY;  Surgeon: Lind Repine, MD;  Location: [facility] ENDOSCOPY;  Service: Pulmonary;  Laterality: N/A;   FLEXIBLE BRONCHOSCOPY  07/16/2007   HAMMERTOE RECONSTRUCTION WITH WEIL OSTEOTOMY Right 06/03/2015   Procedure: RIGHT SECOND HAMMERTOE CORRECTION WITH MT WEIL OSTEOTOMY;  Surgeon: Amada Backer, MD;  Location: [facility];  Service: Orthopedics;  Laterality: Right;   HAND SURGERY Bilateral    1st and 2nd fingers - release of clubbing   IMPLANTATION OF HYPOGLOSSAL NERVE STIMULATOR N/A 10/10/2023   Procedure: IMPLANTATION OF HYPOGLOSSAL NERVE STIMULATOR;  Surgeon: Artice Last, MD;  Location: [facility] OR;  Service: ENT;  Laterality: N/A;  right   KNEE ARTHROSCOPY WITH ANTERIOR CRUCIATE LIGAMENT (ACL) REPAIR Right 01/30/2008   LEFT HEART CATH AND CORS/GRAFTS ANGIOGRAPHY N/A 06/23/2019   Procedure: LEFT HEART CATH AND CORS/GRAFTS ANGIOGRAPHY;  Surgeon: Avanell Leigh, MD;  Location: [facility] INVASIVE CV LAB;  Service: Cardiovascular;  Laterality: N/A;   LEFT HEART CATH AND CORS/GRAFTS ANGIOGRAPHY N/A 03/01/2020   Procedure: LEFT HEART CATH AND CORS/GRAFTS ANGIOGRAPHY;  Surgeon: Arty Binning, MD;  Location: [facility] INVASIVE CV LAB;  Service: Cardiovascular;  Laterality: N/A;   NOVASURE ABLATION  12/19/2007   with removal of IUD   ORIF FOOT FRACTURE Left    TOE SURGERIES TO REMOVE NAILS DUE TO CLUBBING     TOTAL KNEE ARTHROPLASTY Right 10/21/2012   Procedure:  TOTAL KNEE ARTHROPLASTY;  Surgeon: Bevin Bucks, MD;  Location: [facility] ORS;  Service: Orthopedics;  Laterality: Right;   UPPER GASTROINTESTINAL ENDOSCOPY     Social History:  reports that she has been smoking cigarettes. She started smoking about 38 years ago. She has a 76.8 pack-year smoking history. She has never used smokeless tobacco. She reports that she does not drink alcohol and does not use drugs.  Allergies  Allergen Reactions   Benadryl  [Diphenhydramine  Hcl] Shortness Of Breath and Swelling    Tongue swelling   Codeine Other (See Comments)    "makes me psychotic"    Haldol [Haloperidol Decanoate] Anaphylaxis   Amoxicillin Hives and Swelling    Swelling to extremities Did it involve swelling of the face/tongue/throat, SOB, or low BP? Yes Did it involve sudden or severe rash/hives, skin peeling, or any reaction on the inside of your mouth or  nose? Yes Did you need to seek medical attention at a hospital or doctor's office? Yes When did it last happen?    young adult   If all above answers are "NO", may proceed with cephalosporin use.   Bee Venom Swelling and Other (See Comments)    "pimples" from head to toe and whole body swelling    Family History  Problem Relation Age of Onset   Lung cancer Mother    Kidney cancer Mother    Heart disease Father    Anesthesia problems Father        states "did not do well" with anesthesia   Colon polyps Sister    Breast cancer Maternal Aunt    Breast cancer Maternal Aunt    Stomach cancer Neg Hx    Esophageal cancer Neg Hx    Pancreatic cancer Neg Hx    Rectal cancer Neg Hx    Colon cancer Neg Hx     Prior to Admission medications   Medication Sig Start Date End Date Taking? Authorizing Provider  acetaminophen  (TYLENOL ) 500 MG tablet Take 1 tablet (500 mg total) by mouth every 6 (six) hours as needed. Please take every 6 hrs and stagger with Motrin  3 hrs apart from each other. Please do not exceed maximum daily dose to avoid liver  damage 10/10/23   Artice Last, MD  albuterol  (PROVENTIL  HFA;VENTOLIN  HFA) 108 (90 BASE) MCG/ACT inhaler Inhale 2 puffs into the lungs every 6 (six) hours as needed for wheezing or shortness of breath.     [provider]  Alirocumab  (PRALUENT ) 150 MG/ML SOAJ Inject 1 mL (150 mg total) into the skin every 14 (fourteen) days. 07/10/23   Tania Familia, NP  amLODipine  (NORVASC ) 5 MG tablet Take 1 tablet (5 mg total) by mouth daily. 06/19/22   Avanell Leigh, MD  Ascorbic Acid  (VITAMIN C  PO) Take 1 tablet by mouth daily.    [provider]  aspirin  81 MG chewable tablet Chew 81 mg by mouth in the morning and at bedtime.    [provider]  atorvastatin  (LIPITOR ) 80 MG tablet Take 1 tablet (80 mg total) by mouth daily. 08/11/22   Avanell Leigh, MD  brimonidine  (ALPHAGAN ) 0.15 % ophthalmic solution Place 1 drop into both eyes 2 (two) times daily.     [provider]  Budeson-Glycopyrrol-Formoterol  (BREZTRI  AEROSPHERE) 160-9-4.8 MCG/ACT AERO Inhale 2 puffs into the lungs in the morning and at bedtime. 04/30/23   Cobb, Mariah Shines, NP  doxycycline  (VIBRA -TABS) 100 MG tablet Take 1 tablet (100 mg total) by mouth 2 (two) times daily. 10/10/23   Soldatova, Liuba, MD  escitalopram  (LEXAPRO ) 20 MG tablet Take 20 mg by mouth daily. 05/31/19   [provider]  fluticasone  (FLONASE ) 50 MCG/ACT nasal spray Place 2 sprays into both nostrils daily. Patient taking differently: Place 2 sprays into both nostrils daily as needed for allergies. 05/30/23   Soldatova, Liuba, MD  ibuprofen  (ADVIL ) 600 MG tablet Take 1 tablet (600 mg total) by mouth every 6 (six) hours. Please take every 6 hrs, and stagger this medication 3 hrs apart from Tylenol  10/10/23   Soldatova, Liuba, MD  icosapent  Ethyl (VASCEPA ) 1 g capsule TAKE 2 CAPSULES BY MOUTH 2 TIMES DAILY. 09/03/23   Avanell Leigh, MD  isosorbide  mononitrate (IMDUR ) 30 MG 24 hr tablet TAKE 3 TABLETS BY MOUTH DAILY. 02/21/23    Avanell Leigh, MD  lamoTRIgine  (LAMICTAL ) 100 MG tablet Take 100 mg by mouth  daily. 08/05/19   [provider]  metoprolol  tartrate (LOPRESSOR ) 25 MG tablet TAKE 1/2 TABLET BY MOUTH TWICE A DAY 10/20/22   Avanell Leigh, MD  niacin  (NIASPAN ) 1000 MG CR tablet TAKE 1 TABLET (1,000 MG TOTAL) BY MOUTH AT BEDTIME. 02/21/23   Avanell Leigh, MD  nitroGLYCERIN  (NITROSTAT ) 0.4 MG SL tablet Place 1 tablet (0.4 mg total) under the tongue every 5 (five) minutes as needed for chest pain. 07/04/23   Avanell Leigh, MD  ondansetron  (ZOFRAN -ODT) 4 MG disintegrating tablet Take 1 tablet (4 mg total) by mouth every 8 (eight) hours as needed for nausea or vomiting. 07/07/23   Clark, Meghan R, PA-C  oxyCODONE  (ROXICODONE ) 5 MG immediate release tablet Take 1 tablet (5 mg total) by mouth every 6 (six) hours as needed for severe pain (pain score 7-10) (for postop pain). 10/10/23   Soldatova, Liuba, MD  pantoprazole  (PROTONIX ) 40 MG tablet TAKE 1 TABLET BY MOUTH EVERY DAY 11/12/23   Avanell Leigh, MD  QUEtiapine  Fumarate 150 MG TABS Take 150 mg by mouth at bedtime.    [provider]  ranolazine  (RANEXA ) 500 MG 12 hr tablet TAKE 1 TABLET BY MOUTH TWICE A DAY Patient taking differently: Take by mouth 2 (two) times daily. 1 gram twice daily 09/15/22   Avanell Leigh, MD  VITAMIN E PO Take 1 tablet by mouth daily.    [provider]    Physical Exam: Vitals:   01/06/24 1734 01/06/24 1746 01/06/24 1749 01/06/24 2045  BP:  131/75  (!) 149/93  Pulse:  87  76  Resp:  16  (!) 23  Temp:  99.1 F (37.3 C)    TempSrc:  Oral    SpO2: 98% 99%  98%  Weight:   84.8 kg   Height:   5' 5.5" (1.664 m)    Physical Exam Constitutional:      Appearance: Normal appearance. She is obese. She is not ill-appearing.  HENT:     Head: Normocephalic and atraumatic.     Mouth/Throat:     Mouth: Mucous membranes are dry.     Pharynx: Oropharynx is clear. No oropharyngeal exudate.  Eyes:      General: No scleral icterus.    Extraocular Movements: Extraocular movements intact.     Conjunctiva/sclera: Conjunctivae normal.     Pupils: Pupils are equal, round, and reactive to light.  Cardiovascular:     Rate and Rhythm: Normal rate and regular rhythm.     Heart sounds: Normal heart sounds. No murmur heard.    No friction rub. No gallop.  Pulmonary:     Effort: Pulmonary effort is normal. No respiratory distress.     Breath sounds: Normal breath sounds. No wheezing, rhonchi or rales.  Abdominal:     General: There is no distension.     Palpations: Abdomen is soft.     Tenderness: There is no guarding or rebound.     Comments: Mild TTP of epigastric and periumbilical regions. Bowel sounds somewhat hyperactive.  Musculoskeletal:        General: No swelling. Normal range of motion.     Cervical back: Normal range of motion.  Skin:    General: Skin is warm and dry.  Neurological:     General: No focal deficit present.     Mental Status: She is alert and oriented to person, place, and time.  Psychiatric:        Mood and Affect: Mood normal.  Behavior: Behavior normal.     Data Reviewed:  There are no new results to review at this time.    Latest Ref Rng & Units 01/06/2024    5:41 PM 10/04/2023    2:20 PM 07/06/2023    7:52 PM  CBC  WBC 4.0 - 10.5 K/uL 19.5  10.2  20.4   Hemoglobin 12.0 - 15.0 g/dL 7.7  16.1  09.6   Hematocrit 36.0 - 46.0 % 22.9  43.9  30.5   Platelets 150 - 400 K/uL 456  337  409       Latest Ref Rng & Units 01/06/2024    5:41 PM 10/04/2023    2:20 PM 07/06/2023    7:52 PM  CMP  Glucose 70 - 99 mg/dL 045  409  811   BUN 8 - 23 mg/dL 53  12  18   Creatinine 0.44 - 1.00 mg/dL 9.14  7.82  9.56   Sodium 135 - 145 mmol/L 130  140  136   Potassium 3.5 - 5.1 mmol/L 3.1  3.9  3.2   Chloride 98 - 111 mmol/L 94  106  99   CO2 22 - 32 mmol/L 19  24  23    Calcium  8.9 - 10.3 mg/dL 8.8  9.2  21.3   Total Protein 6.5 - 8.1 g/dL 6.9   7.8   Total  Bilirubin 0.0 - 1.2 mg/dL 0.7   2.2   Alkaline Phos 38 - 126 U/L 123   113   AST 15 - 41 U/L 29   30   ALT 0 - 44 U/L 19   20    Lipase     Component Value Date/Time   LIPASE 51 01/06/2024 1741   Urinalysis    Component Value Date/Time   COLORURINE YELLOW 01/06/2024 1919   APPEARANCEUR HAZY (A) 01/06/2024 1919   LABSPEC 1.005 01/06/2024 1919   PHURINE 5.0 01/06/2024 1919   GLUCOSEU NEGATIVE 01/06/2024 1919   HGBUR MODERATE (A) 01/06/2024 1919   BILIRUBINUR NEGATIVE 01/06/2024 1919   KETONESUR NEGATIVE 01/06/2024 1919   PROTEINUR NEGATIVE 01/06/2024 1919   UROBILINOGEN 0.2 06/24/2014 1835   NITRITE NEGATIVE 01/06/2024 1919   LEUKOCYTESUR NEGATIVE 01/06/2024 1919   FOBT - positive  CT ABDOMEN PELVIS WO CONTRAST Result Date: 01/06/2024 CLINICAL DATA:  Acute abdominal pain. Acute kidney injury. Diarrhea. Back pain. EXAM: CT ABDOMEN AND PELVIS WITHOUT CONTRAST TECHNIQUE: Multidetector CT imaging of the abdomen and pelvis was performed following the standard protocol without IV contrast. RADIATION DOSE REDUCTION: This exam was performed according to the departmental dose-optimization program which includes automated exposure control, adjustment of the mA and/or kV according to patient size and/or use of iterative reconstruction technique. COMPARISON:  CT 07/06/2023, levin 924 FINDINGS: Lower chest: No pleural effusion or basilar airspace disease. Coronary artery calcifications/stents. Hepatobiliary: Cholecystectomy. Previous fluid collection in the cholecystectomy bed has resolved. No evidence of focal liver abnormality or liver lesion on this unenhanced exam. There is no biliary dilatation. Pancreas: No ductal dilatation or inflammation. Spleen: Normal in size without focal abnormality. Adrenals/Urinary Tract: No adrenal nodule. No hydronephrosis. No renal calculi. There is mild left renal atrophy. The small renal cysts and indeterminate renal lesions on prior are not well evaluated in the  absence of IV contrast. No further follow-up imaging is recommended. Unremarkable urinary bladder. Stomach/Bowel: Decompressed stomach. No small bowel obstruction or inflammation. Diminutive normal appendix. Small volume of formed stool in the proximal colon. Left colon is decompressed. There is no  pericolonic edema or inflammation. Vascular/Lymphatic: Aortic and branch atherosclerosis. No aortic aneurysm. No suspicious lymphadenopathy in the abdomen or pelvis. Reproductive: Uterus and bilateral adnexa are unremarkable. Other: No ascites or free air.  No abdominal wall hernia. Musculoskeletal: L5-S1 facet hypertrophy. There are no acute or suspicious osseous abnormalities. Stable appearance of fat density lesion in the left abductor magnus where included. IMPRESSION: 1. No acute abnormality or explanation for abdominal pain. 2. Mild left renal atrophy. Renal cysts and indeterminate renal lesions on prior exam are not well evaluated in the absence of IV contrast. Aortic Atherosclerosis (ICD10-I70.0). Electronically Signed   By: Chadwick Colonel M.D.   On: 01/06/2024 18:47    Assessment and Plan: No notes have been filed under this hospital service. Service: Hospitalist  Acute blood loss anemia 2/2 acute GI bleed History of iron deficiency anemia Patient presenting with a 2-day history of melena with diarrhea and nausea and vomiting.  On admission, hemoglobin 7.7 which is down from 14.6 about 3 months ago.  She does also endorse associated shortness of breath with exertion since yesterday likely due to precipitous drop.  FOBT positive in the ED.  CT imaging without any acute abnormalities but not performed with contrast given AKI.  Fortunately, she is currently hemodynamically stable.  GI has been consulted by ED provider, will see in consultation tomorrow morning.  Will keep n.p.o. overnight and have added IV PPI twice daily.  Given precipitous drop in hemoglobin and history of CAD status post PCI and MI,  will provide 1 unit PRBC with goal hemoglobin greater than 8.  Have discussed this with patient and she agrees with plan.  Will also obtain a GI panel given likely diarrheal illness.  Unable to obtain C. difficile testing given active GI bleed. -GI consulted, will see tomorrow morning, appreciate management -NPO -1 unit PRBC (RN to order posttransfusion H&H) -IV PPI BID -trend hgb curve, transfuse if hgb < 8 (given cardiac history) and/or symptomatic -Status post 2 L IV fluid bolus, now on maintenance IV fluids at 100 cc an hour for 12 hours -Follow-up GI path panel, enteric precautions until this results -SCDs for dvt ppx -IM tigan q6h prn for nausea (given prolonged Qtc) -telemetry -admit to inpatient / progressive  AKI AGMA Myoglobinuria Baseline Cr around 0.9-1. On admission, Cr 2.08. Likely prerenal etiology given GI bleeding, diarrheal illness, and resultant dehydration. She also has myoglobinuria on UA likely from significant dehydration as well. CMP also notable for AGMA likely from significant AKI. -f/u renal ultrasound -s/p 2L IVF bolus, now on mIVF at 100cc/hr for 12 hours -trend kidney function, monitor UOP -avoid nephrotoxic medications as able   Hypovolemic hyponatremia -sodium 130 on CMP today -likely due to GI losses and resultant dehydration -she is s/p 2L IVF bolus in ED, now on maintenance IVF at 75cc/hr for 12 hours -trend sodium level on BMP  Mild hypokalemia -K 3.1, likely due to diarrheal illness -giving 3 runs IV potassium -trend potassium on BMP tomorrow, replete as necessary  HTN Patient on norvasc  5mg  daily, lopressor  12.5mg  BID, imdur  30mg  daily at home. BP ranging in the 130s-140s/70s-90s. Will hold home antihypertensives to avoid precipitating hypotension given acute GI bleed.  -holding home antihypertensives -trend BP curve, goal MAP >65  Prediabetes with hyperglycemia -last A1c on file from 2020 of 6.1% -not on any medications for this at  home -blood glucose 152 on CMP today -f/u A1c -trend CBGs, goal 140-180 -consider adding SSI if CBGs above goal  CAD  s/p PCI PVD HLD Hx of MI -resume home lipitor  80mg  daily -holding home ASA given GI bleed -patient also on alirocumab  150mg  every 2 weeks at home (holding while inpatient)  COPD -chronic and stable, no wheezing on exam, saturating well on RA -resume home bronchodilators  OSA -intolerant to CPAP - Recently started Inspire therapy  Depression Anxiety Bipolar 1 disorder -resume home lexapro , lamictal , quetiapine   GERD -IV PPI as noted above   Advance Care Planning:   Code Status: Full Code   Consults: GI  Family Communication: no family at bedside  Severity of Illness: The appropriate patient status for this patient is INPATIENT. Inpatient status is judged to be reasonable and necessary in order to provide the required intensity of service to ensure the patient's safety. The patient's presenting symptoms, physical exam findings, and initial radiographic and laboratory data in the context of their chronic comorbidities is felt to place them at high risk for further clinical deterioration. Furthermore, it is not anticipated that the patient will be medically stable for discharge from the hospital within 2 midnights of admission.   * I certify that at the point of admission it is my clinical judgment that the patient will require inpatient hospital care spanning beyond 2 midnights from the point of admission due to high intensity of service, high risk for further deterioration and high frequency of surveillance required.*  Portions of this note were generated with Dragon dictation software. Dictation errors may occur despite best attempts at proofreading.  Author: Arriah Wadle H Delisha Peaden, MD 01/06/2024 9:07 PM  For on call review [email or web address].

## 2024-01-06 NOTE — Hospital Course (Signed)
 Acute GI bleed Diarrheal illness -abd pain -hgb dropped 7g since March -GI consulted -FOBT positive  AKI

## 2024-01-06 NOTE — ED Provider Notes (Signed)
 [place] EMERGENCY DEPARTMENT AT [facility] Provider Note   CSN: [id number] Arrival date & time: 01/06/24  1725     History  Chief Complaint  Patient presents with   Abdominal Pain    [name] is a 63 y.o. female.  HPI Patient presents with fatigue, abdominal pain, nausea, vomiting, and diarrhea.  Onset was about 1 week ago.  Patient notes that she did not present for evaluation until now as she has only become progressively weak over the past day or so.  She states that she has had bright red blood as well as black stool intermittently throughout the illness.  No actual syncope, no chest pain, no dyspnea.  Patient has a history of gastritis, prior cholecystectomy, is here with a family member.    Home Medications Prior to Admission medications   Medication Sig Start Date End Date Taking? Authorizing Provider  acetaminophen  (TYLENOL ) 500 MG tablet Take 1 tablet (500 mg total) by mouth every 6 (six) hours as needed. Please take every 6 hrs and stagger with Motrin  3 hrs apart from each other. Please do not exceed maximum daily dose to avoid liver damage 10/10/23   Artice Last, MD  albuterol  (PROVENTIL  HFA;VENTOLIN  HFA) 108 (90 BASE) MCG/ACT inhaler Inhale 2 puffs into the lungs every 6 (six) hours as needed for wheezing or shortness of breath.     [provider]  Alirocumab  (PRALUENT ) 150 MG/ML SOAJ Inject 1 mL (150 mg total) into the skin every 14 (fourteen) days. 07/10/23   Tania Familia, NP  amLODipine  (NORVASC ) 5 MG tablet Take 1 tablet (5 mg total) by mouth daily. 06/19/22   Avanell Leigh, MD  Ascorbic Acid  (VITAMIN C  PO) Take 1 tablet by mouth daily.    [provider]  aspirin  81 MG chewable tablet Chew 81 mg by mouth in the morning and at bedtime.    [provider]  atorvastatin  (LIPITOR ) 80 MG tablet Take 1 tablet (80 mg total) by mouth daily. 08/11/22   Avanell Leigh, MD  brimonidine  (ALPHAGAN ) 0.15 %  ophthalmic solution Place 1 drop into both eyes 2 (two) times daily.     [provider]  Budeson-Glycopyrrol-Formoterol  (BREZTRI  AEROSPHERE) 160-9-4.8 MCG/ACT AERO Inhale 2 puffs into the lungs in the morning and at bedtime. 04/30/23   Cobb, Mariah Shines, NP  doxycycline  (VIBRA -TABS) 100 MG tablet Take 1 tablet (100 mg total) by mouth 2 (two) times daily. 10/10/23   Soldatova, Liuba, MD  escitalopram  (LEXAPRO ) 20 MG tablet Take 20 mg by mouth daily. 05/31/19   [provider]  fluticasone  (FLONASE ) 50 MCG/ACT nasal spray Place 2 sprays into both nostrils daily. Patient taking differently: Place 2 sprays into both nostrils daily as needed for allergies. 05/30/23   Soldatova, Liuba, MD  ibuprofen  (ADVIL ) 600 MG tablet Take 1 tablet (600 mg total) by mouth every 6 (six) hours. Please take every 6 hrs, and stagger this medication 3 hrs apart from Tylenol  10/10/23   Soldatova, Liuba, MD  icosapent  Ethyl (VASCEPA ) 1 g capsule TAKE 2 CAPSULES BY MOUTH 2 TIMES DAILY. 09/03/23   Avanell Leigh, MD  isosorbide  mononitrate (IMDUR ) 30 MG 24 hr tablet TAKE 3 TABLETS BY MOUTH DAILY. 02/21/23   Avanell Leigh, MD  lamoTRIgine  (LAMICTAL ) 100 MG tablet Take 100 mg by mouth daily. 08/05/19   [provider]  metoprolol  tartrate (LOPRESSOR ) 25 MG tablet TAKE 1/2 TABLET BY MOUTH TWICE A DAY 10/20/22   Lauro Portal  J, MD  niacin  (NIASPAN ) 1000 MG CR tablet TAKE 1 TABLET (1,000 MG TOTAL) BY MOUTH AT BEDTIME. 02/21/23   Avanell Leigh, MD  nitroGLYCERIN  (NITROSTAT ) 0.4 MG SL tablet Place 1 tablet (0.4 mg total) under the tongue every 5 (five) minutes as needed for chest pain. 07/04/23   Avanell Leigh, MD  ondansetron  (ZOFRAN -ODT) 4 MG disintegrating tablet Take 1 tablet (4 mg total) by mouth every 8 (eight) hours as needed for nausea or vomiting. 07/07/23   Clark, Meghan R, PA-C  oxyCODONE  (ROXICODONE ) 5 MG immediate release tablet Take 1 tablet (5 mg total) by mouth every 6 (six) hours as  needed for severe pain (pain score 7-10) (for postop pain). 10/10/23   Soldatova, Liuba, MD  pantoprazole  (PROTONIX ) 40 MG tablet TAKE 1 TABLET BY MOUTH EVERY DAY 11/12/23   Avanell Leigh, MD  QUEtiapine  Fumarate 150 MG TABS Take 150 mg by mouth at bedtime.    [provider]  ranolazine  (RANEXA ) 500 MG 12 hr tablet TAKE 1 TABLET BY MOUTH TWICE A DAY Patient taking differently: Take by mouth 2 (two) times daily. 1 gram twice daily 09/15/22   Avanell Leigh, MD  VITAMIN E PO Take 1 tablet by mouth daily.    [provider]      Allergies    Benadryl  [diphenhydramine  hcl], Codeine, Haldol [haloperidol decanoate], Amoxicillin, and Bee venom    Review of Systems   Review of Systems  Physical Exam Updated Vital Signs BP 131/75   Pulse 87   Temp 99.1 F (37.3 C) (Oral)   Resp 16   Ht 5' 5.5" (1.664 m)   Wt 84.8 kg   LMP 11/16/2011   SpO2 99%   BMI 30.65 kg/m  Physical Exam Vitals and nursing note reviewed.  Constitutional:      General: She is not in acute distress.    Appearance: She is well-developed. She is obese. She is ill-appearing.  HENT:     Head: Normocephalic and atraumatic.  Eyes:     Conjunctiva/sclera: Conjunctivae normal.  Cardiovascular:     Rate and Rhythm: Normal rate and regular rhythm.  Pulmonary:     Effort: Pulmonary effort is normal. No respiratory distress.     Breath sounds: Normal breath sounds. No stridor.  Abdominal:     General: There is no distension.     Tenderness: There is generalized abdominal tenderness.  Genitourinary:    Rectum: Guaiac result positive.  Skin:    General: Skin is warm and dry.  Neurological:     Mental Status: She is alert and oriented to person, place, and time.     Cranial Nerves: No cranial nerve deficit.  Psychiatric:        Mood and Affect: Mood normal.     ED Results / Procedures / Treatments   Labs (all labs ordered are listed, but only abnormal results are displayed) Labs Reviewed   COMPREHENSIVE METABOLIC PANEL WITH GFR - Abnormal; Notable for the following components:      Result Value   Sodium 130 (*)    Potassium 3.1 (*)    Chloride 94 (*)    CO2 19 (*)    Glucose, Bld 152 (*)    BUN 53 (*)    Creatinine, Ser 2.08 (*)    Calcium  8.8 (*)    GFR, Estimated 26 (*)    Anion gap 17 (*)    All other components within normal limits  CBC - Abnormal; Notable  for the following components:   WBC 19.5 (*)    RBC 2.44 (*)    Hemoglobin 7.7 (*)    HCT 22.9 (*)    Platelets 456 (*)    nRBC 0.7 (*)    All other components within normal limits  POC OCCULT BLOOD, ED - Abnormal; Notable for the following components:   Fecal Occult Bld POSITIVE (*)    All other components within normal limits  LIPASE, BLOOD  URINALYSIS, ROUTINE W REFLEX MICROSCOPIC  TYPE AND SCREEN    EKG EKG Interpretation Date/Time:  Sunday Jan 06 2024 17:48:17 EDT Ventricular Rate:  82 PR Interval:  164 QRS Duration:  125 QT Interval:  430 QTC Calculation: 503 R Axis:   10  Text Interpretation: Sinus rhythm LVH with IVCD and secondary repol abnrm Inferior infarct, old Anterolateral infarct, age indeterminate Prolonged QT interval Confirmed by Dorenda Gandy [phone]) on 01/06/2024 6:39:39 PM  Radiology CT ABDOMEN PELVIS WO CONTRAST Result Date: 01/06/2024 CLINICAL DATA:  Acute abdominal pain. Acute kidney injury. Diarrhea. Back pain. EXAM: CT ABDOMEN AND PELVIS WITHOUT CONTRAST TECHNIQUE: Multidetector CT imaging of the abdomen and pelvis was performed following the standard protocol without IV contrast. RADIATION DOSE REDUCTION: This exam was performed according to the departmental dose-optimization program which includes automated exposure control, adjustment of the mA and/or kV according to patient size and/or use of iterative reconstruction technique. COMPARISON:  CT 07/06/2023, levin 924 FINDINGS: Lower chest: No pleural effusion or basilar airspace disease. Coronary artery  calcifications/stents. Hepatobiliary: Cholecystectomy. Previous fluid collection in the cholecystectomy bed has resolved. No evidence of focal liver abnormality or liver lesion on this unenhanced exam. There is no biliary dilatation. Pancreas: No ductal dilatation or inflammation. Spleen: Normal in size without focal abnormality. Adrenals/Urinary Tract: No adrenal nodule. No hydronephrosis. No renal calculi. There is mild left renal atrophy. The small renal cysts and indeterminate renal lesions on prior are not well evaluated in the absence of IV contrast. No further follow-up imaging is recommended. Unremarkable urinary bladder. Stomach/Bowel: Decompressed stomach. No small bowel obstruction or inflammation. Diminutive normal appendix. Small volume of formed stool in the proximal colon. Left colon is decompressed. There is no pericolonic edema or inflammation. Vascular/Lymphatic: Aortic and branch atherosclerosis. No aortic aneurysm. No suspicious lymphadenopathy in the abdomen or pelvis. Reproductive: Uterus and bilateral adnexa are unremarkable. Other: No ascites or free air.  No abdominal wall hernia. Musculoskeletal: L5-S1 facet hypertrophy. There are no acute or suspicious osseous abnormalities. Stable appearance of fat density lesion in the left abductor magnus where included. IMPRESSION: 1. No acute abnormality or explanation for abdominal pain. 2. Mild left renal atrophy. Renal cysts and indeterminate renal lesions on prior exam are not well evaluated in the absence of IV contrast. Aortic Atherosclerosis (ICD10-I70.0). Electronically Signed   By: Chadwick Colonel M.D.   On: 01/06/2024 18:47    Procedures Procedures    Medications Ordered in ED Medications  pantoprazole  (PROTONIX ) injection 40 mg (has no administration in time range)  lactated ringers  bolus 1,000 mL (has no administration in time range)  sodium chloride  0.9 % bolus 1,000 mL (1,000 mLs Intravenous New Bag/Given 01/06/24 1825)     ED Course/ Medical Decision Making/ A&P                                 Medical Decision Making Patient with reported history of prior GI bleed now presents with abdominal pain nausea, vomiting,  diarrhea, weakness as well as reported black stool.  Concern for ongoing bleed versus other intra-abdominal infection, mass.  Patient is awake and alert, afebrile, somewhat reassuring. Cardiacs 85 sinus normal pulse ox 100% room air normal CT, labs, fluid resuscitation all started after arrival.   Amount and/or Complexity of Data Reviewed Independent Historian:     Details: Family member External Data Reviewed: notes.    Details: GI notes with history of gastritis Labs: ordered. Decision-making details documented in ED Course. Radiology: ordered and independent interpretation performed. Decision-making details documented in ED Course. ECG/medicine tests: ordered and independent interpretation performed. Decision-making details documented in ED Course.  Risk Decision regarding hospitalization. Diagnosis or treatment significantly limited by social determinants of health.   7:33 PM Hemoglobin now 7.7 decreased from 14 several months ago.  Patient also with evidence for acute kidney injury with creatinine greater than 2, BUN 53.  She remains awake and alert, has received initial fluid resuscitation, is awaiting additional fluids, Protonix .  I have messaged our GI colleagues and they will follow the consulting service in the morning.  Given concern for acute kidney injury, GI bleed, abdominal pain, though without hemodynamic instability patiently admitted to our internal medicine colleagues.  CRITICAL CARE Performed by: Dorenda Gandy Total critical care time: 35 minutes Critical care time was exclusive of separately billable procedures and treating other patients. Critical care was necessary to treat or prevent imminent or life-threatening deterioration. Critical care was time spent  personally by me on the following activities: development of treatment plan with patient and/or surrogate as well as nursing, discussions with consultants, evaluation of patient's response to treatment, examination of patient, obtaining history from patient or surrogate, ordering and performing treatments and interventions, ordering and review of laboratory studies, ordering and review of radiographic studies, pulse oximetry and re-evaluation of patient's condition.  Final Clinical Impression(s) / ED Diagnoses Final diagnoses:  Generalized abdominal pain  Acute lower GI bleeding  AKI (acute kidney injury) [facility])    Rx / DC Orders ED Discharge Orders     None         Dorenda Gandy, MD 01/06/24 1935

## 2024-01-07 ENCOUNTER — Encounter (HOSPITAL_COMMUNITY): Payer: Self-pay | Admitting: Student

## 2024-01-07 ENCOUNTER — Encounter: Payer: Self-pay | Admitting: Nurse Practitioner

## 2024-01-07 ENCOUNTER — Inpatient Hospital Stay (HOSPITAL_COMMUNITY): Admitting: Anesthesiology

## 2024-01-07 ENCOUNTER — Encounter (HOSPITAL_COMMUNITY)
Admission: EM | Disposition: A | Discharge status: Home / Self Care | Payer: Self-pay | Source: Home / Self Care | Attending: Internal Medicine

## 2024-01-07 DIAGNOSIS — E876 Hypokalemia: Secondary | ICD-10-CM | POA: Diagnosis not present

## 2024-01-07 DIAGNOSIS — N179 Acute kidney failure, unspecified: Secondary | ICD-10-CM | POA: Diagnosis not present

## 2024-01-07 DIAGNOSIS — K2980 Duodenitis without bleeding: Secondary | ICD-10-CM

## 2024-01-07 DIAGNOSIS — K2289 Other specified disease of esophagus: Secondary | ICD-10-CM | POA: Diagnosis not present

## 2024-01-07 DIAGNOSIS — Z7902 Long term (current) use of antithrombotics/antiplatelets: Secondary | ICD-10-CM

## 2024-01-07 DIAGNOSIS — K2970 Gastritis, unspecified, without bleeding: Secondary | ICD-10-CM | POA: Diagnosis not present

## 2024-01-07 DIAGNOSIS — K2210 Ulcer of esophagus without bleeding: Secondary | ICD-10-CM | POA: Diagnosis not present

## 2024-01-07 DIAGNOSIS — K922 Gastrointestinal hemorrhage, unspecified: Secondary | ICD-10-CM | POA: Diagnosis not present

## 2024-01-07 DIAGNOSIS — I10 Essential (primary) hypertension: Secondary | ICD-10-CM

## 2024-01-07 DIAGNOSIS — I2510 Atherosclerotic heart disease of native coronary artery without angina pectoris: Secondary | ICD-10-CM | POA: Diagnosis not present

## 2024-01-07 DIAGNOSIS — K449 Diaphragmatic hernia without obstruction or gangrene: Secondary | ICD-10-CM

## 2024-01-07 DIAGNOSIS — R112 Nausea with vomiting, unspecified: Secondary | ICD-10-CM

## 2024-01-07 DIAGNOSIS — K259 Gastric ulcer, unspecified as acute or chronic, without hemorrhage or perforation: Secondary | ICD-10-CM

## 2024-01-07 DIAGNOSIS — K921 Melena: Secondary | ICD-10-CM

## 2024-01-07 DIAGNOSIS — D62 Acute posthemorrhagic anemia: Secondary | ICD-10-CM | POA: Diagnosis not present

## 2024-01-07 DIAGNOSIS — F17210 Nicotine dependence, cigarettes, uncomplicated: Secondary | ICD-10-CM

## 2024-01-07 HISTORY — PX: ESOPHAGOGASTRODUODENOSCOPY: SHX5428

## 2024-01-07 LAB — GASTROINTESTINAL PANEL BY PCR, STOOL (REPLACES STOOL CULTURE)

## 2024-01-07 LAB — CBC
HCT: 27.1 % — ABNORMAL LOW (ref 36.0–46.0)
Hemoglobin: 8.9 g/dL — ABNORMAL LOW (ref 12.0–15.0)
MCH: 31.1 pg (ref 26.0–34.0)
MCHC: 32.8 g/dL (ref 30.0–36.0)
MCV: 94.8 fL (ref 80.0–100.0)
Platelets: 379 10*3/uL (ref 150–400)
RBC: 2.86 MIL/uL — ABNORMAL LOW (ref 3.87–5.11)
RDW: 14.6 % (ref 11.5–15.5)
WBC: 14.7 10*3/uL — ABNORMAL HIGH (ref 4.0–10.5)
nRBC: 1 % — ABNORMAL HIGH (ref 0.0–0.2)

## 2024-01-07 LAB — MAGNESIUM: Magnesium: 1.7 mg/dL (ref 1.7–2.4)

## 2024-01-07 LAB — BPAM RBC
Blood Product Expiration Date: 202506142359
ISSUE DATE / TIME: 202505182339
Unit Type and Rh: 6200

## 2024-01-07 LAB — TYPE AND SCREEN
ABO/RH(D): A POS
Antibody Screen: NEGATIVE
Unit division: 0

## 2024-01-07 LAB — PROTIME-INR
INR: 1.1 (ref 0.8–1.2)
Prothrombin Time: 14.2 s (ref 11.4–15.2)

## 2024-01-07 LAB — GLUCOSE, CAPILLARY
Glucose-Capillary: 114 mg/dL — ABNORMAL HIGH (ref 70–99)
Glucose-Capillary: 145 mg/dL — ABNORMAL HIGH (ref 70–99)
Glucose-Capillary: 179 mg/dL — ABNORMAL HIGH (ref 70–99)
Glucose-Capillary: 146 mg/dL — ABNORMAL HIGH (ref 70–99)

## 2024-01-07 LAB — BASIC METABOLIC PANEL WITH GFR
Anion gap: 13 (ref 5–15)
BUN: 38 mg/dL — ABNORMAL HIGH (ref 8–23)
CO2: 23 mmol/L (ref 22–32)
Calcium: 8.4 mg/dL — ABNORMAL LOW (ref 8.9–10.3)
Chloride: 98 mmol/L (ref 98–111)
Creatinine, Ser: 1.37 mg/dL — ABNORMAL HIGH (ref 0.44–1.00)
GFR, Estimated: 44 mL/min — ABNORMAL LOW (ref >60)
Glucose, Bld: 118 mg/dL — ABNORMAL HIGH (ref 70–99)
Potassium: 2.6 mmol/L — CL (ref 3.5–5.1)
Sodium: 134 mmol/L — ABNORMAL LOW (ref 135–145)

## 2024-01-07 LAB — PREPARE RBC (CROSSMATCH)

## 2024-01-07 LAB — POTASSIUM: Potassium: 3.4 mmol/L — ABNORMAL LOW (ref 3.5–5.1)

## 2024-01-07 LAB — APTT: aPTT: 25 s (ref 24–36)

## 2024-01-07 LAB — HEMOGLOBIN A1C
Hgb A1c MFr Bld: 5 % (ref 4.8–5.6)
Mean Plasma Glucose: 96.8 mg/dL

## 2024-01-07 SURGERY — EGD (ESOPHAGOGASTRODUODENOSCOPY)
Anesthesia: Monitor Anesthesia Care

## 2024-01-07 MED ORDER — PROPOFOL 500 MG/50ML IV EMUL
INTRAVENOUS | Status: DC | PRN
  Administered 2024-01-07: 20 mg via INTRAVENOUS
  Administered 2024-01-07: 30 mg via INTRAVENOUS
  Administered 2024-01-07: 100 ug/kg/min via INTRAVENOUS
  Administered 2024-01-07: 50 mg via INTRAVENOUS
  Administered 2024-01-07: 20 mg via INTRAVENOUS

## 2024-01-07 MED ORDER — SODIUM CHLORIDE 0.9 % IV SOLN: INTRAVENOUS | Status: AC

## 2024-01-07 MED ORDER — SUCRALFATE 1 G PO TABS
1.0000 g | ORAL_TABLET | Freq: Three times a day (TID) | ORAL | Status: DC
  Administered 2024-01-07 – 2024-01-08 (×3): 1 g via ORAL
  Filled 2024-01-07 (×3): qty 1

## 2024-01-07 MED ORDER — POTASSIUM CHLORIDE CRYS ER 20 MEQ PO TBCR
40.0000 meq | EXTENDED_RELEASE_TABLET | Freq: Once | ORAL | Status: AC
  Administered 2024-01-07: 40 meq via ORAL
  Filled 2024-01-07: qty 2

## 2024-01-07 MED ORDER — PANTOPRAZOLE SODIUM 40 MG PO TBEC: 40.0000 mg | DELAYED_RELEASE_TABLET | Freq: Two times a day (BID) | ORAL | Status: DC

## 2024-01-07 MED ORDER — LIDOCAINE HCL (PF) 2 % IJ SOLN
INTRAMUSCULAR | Status: DC | PRN
  Administered 2024-01-07: 80 mg via INTRADERMAL

## 2024-01-07 MED ORDER — POTASSIUM CHLORIDE 10 MEQ/100ML IV SOLN
10.0000 meq | INTRAVENOUS | Status: AC
  Administered 2024-01-07 (×6): 10 meq via INTRAVENOUS
  Filled 2024-01-07 (×6): qty 100

## 2024-01-07 MED ORDER — NICOTINE 7 MG/24HR TD PT24
7.0000 mg | MEDICATED_PATCH | Freq: Once | TRANSDERMAL | Status: AC
  Administered 2024-01-07: 7 mg via TRANSDERMAL
  Filled 2024-01-07 (×2): qty 1

## 2024-01-07 MED ORDER — MAGNESIUM SULFATE 2 GM/50ML IV SOLN
2.0000 g | Freq: Once | INTRAVENOUS | Status: AC
  Administered 2024-01-07: 2 g via INTRAVENOUS
  Filled 2024-01-07: qty 50

## 2024-01-07 MED ORDER — PANTOPRAZOLE SODIUM 40 MG PO TBEC
40.0000 mg | DELAYED_RELEASE_TABLET | Freq: Two times a day (BID) | ORAL | Status: DC
  Administered 2024-01-07 – 2024-01-08 (×2): 40 mg via ORAL
  Filled 2024-01-07 (×2): qty 1

## 2024-01-07 MED ORDER — ONDANSETRON HCL 4 MG/2ML IJ SOLN
INTRAMUSCULAR | Status: DC | PRN
  Administered 2024-01-07: 4 mg via INTRAVENOUS

## 2024-01-07 NOTE — Op Note (Signed)
 [facility] Patient Name: [name] Procedure Date: 01/07/2024 MRN: [id number] Attending MD: Yong Henle , MD, [id number] Date of Birth: July 21, 1961 CSN: [id number] Age: 63 Admit Type: Outpatient Procedure:                Upper GI endoscopy Indications:              Epigastric abdominal pain, Acute post hemorrhagic                            anemia, Melena, Occult blood in stool, Recent                            gastrointestinal bleeding, Exclusion of peptic                            ulcer, Nausea with vomiting Providers:                Yong Henle, MD Referring MD:             Inpatient medical service Medicines:                Monitored Anesthesia Care Complications:            No immediate complications. Estimated Blood Loss:     Estimated blood loss was minimal. Procedure:                Pre-Anesthesia Assessment:                           - Prior to the procedure, a History and Physical                            was performed, and patient medications and                            allergies were reviewed. The patient's tolerance of                            previous anesthesia was also reviewed. The risks                            and benefits of the procedure and the sedation                            options and risks were discussed with the patient.                            All questions were answered, and informed consent                            was obtained. Prior Anticoagulants: The patient                            last took Plavix  (clopidogrel ) 3 days prior to the  procedure and has taken no anticoagulant or                            antiplatelet agents except for aspirin . ASA Grade                            Assessment: III - A patient with severe systemic                            disease. After reviewing the risks and benefits,                            the patient was deemed in satisfactory  condition to                            undergo the procedure.                           After obtaining informed consent, the endoscope was                            passed under direct vision. Throughout the                            procedure, the patient's blood pressure, pulse, and                            oxygen saturations were monitored continuously. The                            [id number] ([id number]) Olympus therapeutic endoscope                            was introduced through the mouth, and advanced to                            the second part of duodenum. The upper GI endoscopy                            was accomplished without difficulty. The patient                            tolerated the procedure. Scope In: Scope Out: Findings:      No gross lesions were noted in the entire esophagus.      The Z-line was irregular and was found 37 cm from the incisors.      A 3 cm hiatal hernia was present.      Three non-bleeding linear gastric ulcers with a clean ulcer base       (Forrest Class III) were found in the gastric antrum. The largest lesion       was 10 mm in largest dimension.      Segmental moderate inflammation characterized by erosions, erythema,       friability and granularity was found in the entire examined stomach.  Biopsies were taken with a cold forceps for histology and Helicobacter       pylori testing.      Diffuse moderate inflammation characterized by erythema and friability       was found in the duodenal bulb and the duodenal sweep. Biopsies were       taken with a cold forceps for histology.      No gross lesions were noted in the second portion of the duodenum. Impression:               - No gross lesions in the entire esophagus. Z-line                            irregular, 37 cm from the incisors.                           - 3 cm hiatal hernia.                           - 3 non-bleeding gastric ulcers with a clean ulcer                             base (Forrest Class III) found in the antral region.                           - Gastritis. Biopsied.                           - Duodenitis in Bulb and Sweep. Biopsied.                           - No gross lesions in the second portion of the                            duodenum. Moderate Sedation:      Not Applicable - Patient had care per Anesthesia. Recommendation:           - The patient will be observed post-procedure,                            until all discharge criteria are met.                           - Return patient to hospital ward for ongoing care.                           - Advance diet as tolerated.                           - Recommend holding Plavix  for 72 hours to allow                            for further healing of gastric ulcers and                            inflammation. If aspirin  needs to be  restarted, can                            restart tomorrow.                           - Increase PPI to 40 mg twice daily.                           - Initiate Carafate  1 g 4 times daily x 2 weeks and                            then twice daily for 1 month. Then may stop.                           - Observe patient's clinical course.                           - Await pathology results.                           - Repeat upper endoscopy in 4 months to check                            healing. Can likely pursue at same time as                            Colonoscopy as she is due this year for                            surveillance. Would need to coordinate with primary                            GI.                           - Depending on how patient does may be able to                            discharge soon.                           - The findings and recommendations were discussed                            with the patient.                           - The findings and recommendations were discussed                            with the referring  physician. Procedure Code(s):        --- Professional ---                           [phone], Esophagogastroduodenoscopy,  flexible,                            transoral; with biopsy, single or multiple Diagnosis Code(s):        --- Professional ---                           K22.89, Other specified disease of esophagus                           K44.9, Diaphragmatic hernia without obstruction or                            gangrene                           K29.70, Gastritis, unspecified, without bleeding                           K25.9, Gastric ulcer, unspecified as acute or                            chronic, without hemorrhage or perforation                           K29.80, Duodenitis without bleeding                           R10.13, Epigastric pain                           D62, Acute posthemorrhagic anemia                           K92.1, Melena (includes Hematochezia)                           R19.5, Other fecal abnormalities                           K92.2, Gastrointestinal hemorrhage, unspecified                           R11.2, Nausea with vomiting, unspecified CPT copyright 2022 [employer]. All rights reserved. The codes documented in this report are preliminary and upon coder review may  be revised to meet current compliance requirements. Yong Henle, MD 01/07/2024 1:19:31 PM Number of Addenda: 0

## 2024-01-07 NOTE — Consult Note (Signed)
 Consultation  Referring Provider: DO Katrina Parma     Primary Care Physician:  Claudell Cruz, MD Primary Gastroenterologist: Dr. Bridgett Camps        Reason for Consultation: Anemia            HPI:   [name] is a 63 y.o. female with a past medical history as listed below including iron deficiency anemia, CAD status post PCI, COPD, hypertension, hyperlipidemia, GERD, obesity, OSA, depression, anxiety, bipolar 1 disorder, PVD, history of MI presented to the ED with abdominal pain on 01/06/2024.    At time of presentation patient described being in normal state of health until Friday, then started with nausea and multiple episodes of nausea and bilious, nonbloody emesis.  Saturday had 1 episode of a fully formed black-colored stool, multiple episodes, greater than 7 throughout the day and continued to experience nausea and vomiting on Sunday.  Associated symptoms included dyspnea on exertion.    Today, at time of my interview, patient describes that she always has bloating, apparently has been worked up in our clinic before and she went through a cholecystectomy last year which did not really help.  Tells me she has constant "swelling" in her stomach and blames it on the fact that she drinks a lot of liquids most of which are sweet tea and copious amounts.  Explains that sometimes this makes her feel like she does not want to eat but on Friday, 5/16, she became nauseous and started vomiting multiple times.  She continued to feel somewhat ill and Saturday continued to vomit but then also started with "black stool" at least 6-7 throughout the day, tells me it was black in the toilet and when she would wipe she saw blood.  The last time she vomited was Saturday night and it looked like "sand", her last melenic bowel movement was around 6:30/7 pm last night.  She continues to be nauseous and describes some epigastric discomfort.  Chronically on Aspirin  and Plavix  for her history of MI, last dose of  Plavix  noted on the evening of 5/16.  Associated symptoms include an episode of syncope and some DOE.    Of note patient tells me that she is very upset because she has not been able to drink any water and she always drinks so much liquids, has also not taken any of her psych meds this morning and is feeling tearful and anxious.    Denies fever, chills or weight loss.     ED course: CBC with leukocytosis to 19.5, hemoglobin 7.7 (down from 14.63 months ago)-->8.9, reactive thrombocytosis, CMP with a sodium of 130, potassium 3.1--> 2.6, bicarbonate 19, glucose 152, creatinine 2.08, BUN 53, AG 17, lipase normal, FOBT positive, urinalysis with myoglobinuria, EKG with sinus rhythm, prolonged QTC; CTAP without contrast with no abnormality  GI history: 01/12/2023 H. pylori antigen in the stool negative 12/25/2022 office visit with Loa Riling at that time patient presenting for complaints of dysphagia and abdominal pain also generalized abdominal discomfort with bloating and fullness-at that time barium esophagram, H. pylori antigen ordered, referred to [facility], continued on Linzess  145 mcg 04/22/2021 colonoscopy done for screening with last exam limited by poor prep with a 4 mm polyp in the ascending colon, two 4 mm polyps in the transverse colon, a single colonic angiodysplastic lesion, two 3-6 mm polyps in the sigmoid colon, 2 to-3 mm polyps at the rectosigmoid colon, internal hemorrhoids; pathology showed tubular adenomas and recall placed for 3 years  02/03/2021 EGD with 1 cm hiatal hernia, erythematous mucosa in the gastric body and otherwise normal; at that time discussed possible surgical evaluation for possible cholecystectomy/biliary colic if symptoms persisted, trial Omeprazole  40 mg daily; biopsy showed H. pylori gastritis-treated with Pylera  Past Medical History:  Diagnosis Date   Allergy    Anemia    takes iron supplement   Arthritis    knees, shoulders   Asthma    Bipolar 1 disorder (HCC)     Blood transfusion without reported diagnosis    Cataract, immature    CHF (congestive heart failure) (HCC)    Clotting disorder (HCC)    she denied any specific genetic bleeding/clotting diagnosis (10/05/23)   COPD (chronic obstructive pulmonary disease) (HCC)    Coronary artery disease    triple vessel   Depression    Dyspnea    Family history of adverse reaction to anesthesia    states father "did not do well" with anesthesia; died last time he had surgery; also had heart disease   Gallstones    GERD (gastroesophageal reflux disease)    occasional - no current med.   Glaucoma    Hammertoe 05/2015   right second toe   Heart murmur    Hepatic steatosis    High cholesterol    History of MI (myocardial infarction)    Hypertension    states under control with meds., has been on med. x 8 yr.   Myocardial infarction (HCC)    Osteoporosis    Panic attacks    Peripheral vascular disease (HCC)    carotid   Renal cyst, left    Schizophrenia (HCC)    Short of breath on exertion    Sleep apnea    Inspire in future   Urinary, incontinence, stress female    Wears dentures    upper   Wears partial dentures    lower    Past Surgical History:  Procedure Laterality Date   C-SECTIONS X 2  1985, 1988   CARDIAC CATHETERIZATION  12/10/2006; 12/18/2006; 07/10/2007; 07/19/2009   CARDIAC CATHETERIZATION  03/04/2010   patent grafts, normal LV function, diffusely diseased RCA (Dr. Aleda Ammon)   CARDIAC CATHETERIZATION N/A 02/15/2016   Procedure: Left Heart Cath and Cors/Grafts Angiography;  Surgeon: Peter M Swaziland, MD;  Location: [facility] INVASIVE CV LAB;  Service: Cardiovascular;  Laterality: N/A;   CHOLECYSTECTOMY N/A 06/29/2023   Procedure: LAPAROSCOPIC CHOLECYSTECTOMY WITH ICG DYE;  Surgeon: Aldean Hummingbird, MD;  Location: [facility] ORS;  Service: General;  Laterality: N/A;   CLUB FOOT RELEASE Bilateral    1st and 2nd toes   COLONOSCOPY     CORONARY ARTERY BYPASS GRAFT  07/16/2007   LIMA to LAD, VG to  ramus, VG to PDA    CORONARY STENT INTERVENTION N/A 06/23/2019   Procedure: CORONARY STENT INTERVENTION;  Surgeon: Avanell Leigh, MD;  Location: [facility] INVASIVE CV LAB;  Service: Cardiovascular;  Laterality: N/A;   DRUG INDUCED ENDOSCOPY N/A 05/17/2023   Procedure: DRUG INDUCED ENDOSCOPY;  Surgeon: Lind Repine, MD;  Location: [facility];  Service: Pulmonary;  Laterality: N/A;   FLEXIBLE BRONCHOSCOPY  07/16/2007   HAMMERTOE RECONSTRUCTION WITH WEIL OSTEOTOMY Right 06/03/2015   Procedure: RIGHT SECOND HAMMERTOE CORRECTION WITH MT WEIL OSTEOTOMY;  Surgeon: Amada Backer, MD;  Location: [facility];  Service: Orthopedics;  Laterality: Right;   HAND SURGERY Bilateral    1st and 2nd fingers - release of clubbing   IMPLANTATION OF HYPOGLOSSAL NERVE STIMULATOR N/A 10/10/2023  Procedure: IMPLANTATION OF HYPOGLOSSAL NERVE STIMULATOR;  Surgeon: Artice Last, MD;  Location: [facility] OR;  Service: ENT;  Laterality: N/A;  right   KNEE ARTHROSCOPY WITH ANTERIOR CRUCIATE LIGAMENT (ACL) REPAIR Right 01/30/2008   LEFT HEART CATH AND CORS/GRAFTS ANGIOGRAPHY N/A 06/23/2019   Procedure: LEFT HEART CATH AND CORS/GRAFTS ANGIOGRAPHY;  Surgeon: Avanell Leigh, MD;  Location: [facility] INVASIVE CV LAB;  Service: Cardiovascular;  Laterality: N/A;   LEFT HEART CATH AND CORS/GRAFTS ANGIOGRAPHY N/A 03/01/2020   Procedure: LEFT HEART CATH AND CORS/GRAFTS ANGIOGRAPHY;  Surgeon: Arty Binning, MD;  Location: [facility] INVASIVE CV LAB;  Service: Cardiovascular;  Laterality: N/A;   NOVASURE ABLATION  12/19/2007   with removal of IUD   ORIF FOOT FRACTURE Left    TOE SURGERIES TO REMOVE NAILS DUE TO CLUBBING     TOTAL KNEE ARTHROPLASTY Right 10/21/2012   Procedure: TOTAL KNEE ARTHROPLASTY;  Surgeon: Bevin Bucks, MD;  Location: [facility];  Service: Orthopedics;  Laterality: Right;   UPPER GASTROINTESTINAL ENDOSCOPY      Family History  Problem Relation Age of Onset   Lung cancer Mother    Kidney cancer Mother    Heart  disease Father    Anesthesia problems Father        states "did not do well" with anesthesia   Colon polyps Sister    Breast cancer Maternal Aunt    Breast cancer Maternal Aunt    Stomach cancer Neg Hx    Esophageal cancer Neg Hx    Pancreatic cancer Neg Hx    Rectal cancer Neg Hx    Colon cancer Neg Hx     Social History   Tobacco Use   Smoking status: Some Days    Current packs/day: 2.00    Average packs/day: 2.0 packs/day for 38.4 years (76.8 ttl pk-yrs)    Types: Cigarettes    Start date: 08/21/1985   Smokeless tobacco: Never   Tobacco comments:    10/23/2023 patient smoke a pack of cigarettes a month  Vaping Use   Vaping status: Never Used  Substance Use Topics   Alcohol use: No   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 Authorizing Provider  albuterol  (PROVENTIL  HFA;VENTOLIN  HFA) 108 (90 BASE) MCG/ACT inhaler Inhale 2 puffs into the lungs every 6 (six) hours as needed for wheezing or shortness of breath.    Yes [provider]  Alirocumab  (PRALUENT ) 150 MG/ML SOAJ Inject 1 mL (150 mg total) into the skin every 14 (fourteen) days. 07/10/23  Yes Tania Familia, NP  amLODipine  (NORVASC ) 5 MG tablet Take 1 tablet (5 mg total) by mouth daily. 06/19/22  Yes Avanell Leigh, MD  Ascorbic Acid  (VITAMIN C  PO) Take 1 tablet by mouth daily.   Yes [provider]  aspirin  81 MG chewable tablet Chew 162 mg by mouth in the morning and at bedtime.   Yes [provider]  atorvastatin  (LIPITOR ) 80 MG tablet Take 1 tablet (80 mg total) by mouth daily. 08/11/22  Yes Avanell Leigh, MD  brimonidine  (ALPHAGAN ) 0.15 % ophthalmic solution Place 1 drop into both eyes 2 (two) times daily.    Yes [provider]  escitalopram  (LEXAPRO ) 20 MG tablet Take 20 mg by mouth daily. 05/31/19  Yes [provider]  icosapent  Ethyl (VASCEPA ) 1 g capsule TAKE 2  CAPSULES BY MOUTH 2 TIMES  DAILY. 09/03/23  Yes Avanell Leigh, MD  isosorbide  mononitrate (IMDUR ) 30 MG 24 hr tablet TAKE 3 TABLETS BY MOUTH DAILY. 02/21/23  Yes Avanell Leigh, MD  lamoTRIgine  (LAMICTAL ) 100 MG tablet Take 100 mg by mouth daily. 08/05/19  Yes [provider]  metoprolol  tartrate (LOPRESSOR ) 25 MG tablet TAKE 1/2 TABLET BY MOUTH TWICE A DAY 10/20/22  Yes Avanell Leigh, MD  niacin  (NIASPAN ) 1000 MG CR tablet TAKE 1 TABLET (1,000 MG TOTAL) BY MOUTH AT BEDTIME. 02/21/23  Yes Avanell Leigh, MD  nitroGLYCERIN  (NITROSTAT ) 0.4 MG SL tablet Place 1 tablet (0.4 mg total) under the tongue every 5 (five) minutes as needed for chest pain. 07/04/23  Yes Avanell Leigh, MD  ondansetron  (ZOFRAN -ODT) 4 MG disintegrating tablet Take 1 tablet (4 mg total) by mouth every 8 (eight) hours as needed for nausea or vomiting. 07/07/23  Yes Clark, Meghan R, PA-C  pantoprazole  (PROTONIX ) 40 MG tablet TAKE 1 TABLET BY MOUTH EVERY DAY 11/12/23  Yes Avanell Leigh, MD  QUEtiapine  Fumarate 150 MG TABS Take 150 mg by mouth at bedtime.   Yes [provider]  Budeson-Glycopyrrol-Formoterol  (BREZTRI  AEROSPHERE) 160-9-4.8 MCG/ACT AERO Inhale 2 puffs into the lungs in the morning and at bedtime. 04/30/23   Cobb, Mariah Shines, NP  fluticasone  (FLONASE ) 50 MCG/ACT nasal spray Place 2 sprays into both nostrils daily. Patient taking differently: Place 2 sprays into both nostrils daily as needed for allergies. 05/30/23   Soldatova, Liuba, MD  LINZESS  145 MCG CAPS capsule Take 145 mcg by mouth every morning. Patient not taking: Reported on 01/06/2024 11/10/23   [provider]  ranolazine  (RANEXA ) 500 MG 12 hr tablet TAKE 1 TABLET BY MOUTH TWICE A DAY Patient taking differently: Take 1,000 mg by mouth 2 (two) times daily. 1 gram twice daily 09/15/22   Avanell Leigh, MD  VITAMIN E PO Take 1 tablet by mouth daily.    [provider]    Current Facility-Administered Medications   Medication Dose Route Frequency Provider Last Rate Last Admin   0.9 %  sodium chloride  infusion   Intravenous Continuous Jinwala, Sagar H, MD 100 mL/hr at 01/07/24 4098 Infusion Verify at 01/07/24 1191   acetaminophen  (TYLENOL ) tablet 650 mg  650 mg Oral Q6H PRN Jinwala, Sagar H, MD       Or   acetaminophen  (TYLENOL ) suppository 650 mg  650 mg Rectal Q6H PRN Jinwala, Sagar H, MD       albuterol  (PROVENTIL ) (2.5 MG/3ML) 0.083% nebulizer solution 3 mL  3 mL Inhalation Q6H PRN Jinwala, Sagar H, MD       atorvastatin  (LIPITOR ) tablet 80 mg  80 mg Oral Daily Jinwala, Sagar H, MD       budesonide -glycopyrrolate -formoterol  (BREZTRI ) 160-9-4.8 MCG/ACT inhaler 2 puff  2 puff Inhalation BID Jinwala, Sagar H, MD   2 puff at 01/07/24 0818   escitalopram  (LEXAPRO ) tablet 20 mg  20 mg Oral Daily Jinwala, Sagar H, MD       lamoTRIgine  (LAMICTAL ) tablet 100 mg  100 mg Oral Daily Jinwala, Sagar H, MD       nicotine  (NICODERM CQ  - dosed in mg/24 hr) patch 7 mg  7 mg Transdermal Once Daniels, James K, NP   7 mg at 01/07/24 0545   pantoprazole  (PROTONIX ) injection 40 mg  40 mg Intravenous BID Jinwala, Sagar H, MD       potassium chloride  10 mEq in 100 mL IVPB  10 mEq Intravenous Q1 Hr  x 6 Olena Bernard, NP 100 mL/hr at 01/07/24 2536 10 mEq at 01/07/24 6440   QUEtiapine  (SEROQUEL ) tablet 150 mg  150 mg Oral QHS Jinwala, Sagar H, MD       trimethobenzamide  (TIGAN ) injection 200 mg  200 mg Intramuscular Q6H PRN Mandy Second, MD        Allergies as of 01/06/2024 - Review Complete 01/06/2024  Allergen Reaction Noted   Benadryl  [diphenhydramine  hcl] Shortness Of Breath and Swelling 06/03/2015   Codeine Other (See Comments) 11/18/2011   Haldol [haloperidol decanoate] Anaphylaxis 11/18/2011   Amoxicillin Hives and Swelling 11/18/2011   Bee venom Swelling and Other (See Comments) 11/16/2015    Review of Systems:    Constitutional: No weight loss, fever or chills  Skin: No rash  Cardiovascular: No chest pain   Respiratory: No SOB  Gastrointestinal: See HPI and otherwise negative Genitourinary: No dysuria  Neurological: No headache, dizziness or syncope Musculoskeletal: No new muscle or joint pain Hematologic: No bruising Psychiatric: +depression, +anxiety, +bipolar   Physical Exam:  Vital signs in last 24 hours: Temp:  [97.5 F (36.4 C)-99.1 F (37.3 C)] 98.2 F (36.8 C) (05/19 0636) Pulse Rate:  [68-87] 75 (05/19 0636) Resp:  [13-23] 16 (05/19 0636) BP: (123-155)/(54-93) 123/70 (05/19 0636) SpO2:  [96 %-99 %] 97 % (05/19 0820) Weight:  [84.8 kg] 84.8 kg (05/18 1749) Last BM Date : 01/06/24 General:   Pleasant Caucasian female appears to be in mild distress, Well developed, Well nourished, alert and cooperative Head:  Normocephalic and atraumatic. Eyes:   PEERL, EOMI. No icterus. Conjunctiva pink. Ears:  Normal auditory acuity. Neck:  Supple Throat: Oral cavity and pharynx without inflammation, swelling or lesion. Teeth in good condition. Lungs: Respirations even and unlabored. Lungs clear to auscultation bilaterally.   No wheezes, crackles, or rhonchi.  Heart: Normal S1, S2. No MRG. Regular rate and rhythm. No peripheral edema, cyanosis or pallor.  Abdomen:  Soft, nondistended, mild epigastric ttp. No rebound or guarding. Normal bowel sounds. No appreciable masses or hepatomegaly. Rectal:  Not performed.  Msk:  Symmetrical without gross deformities. Peripheral pulses intact.  Extremities:  Without edema, no deformity or joint abnormality. Normal ROM, normal sensation. Neurologic:  Alert and  oriented x4;  grossly normal neurologically. Skin:   Dry and intact without significant lesions or rashes. Psychiatric: Demonstrates good judgement and reason. +tearful and anxious   LAB RESULTS: Recent Labs    01/06/24 1741 01/07/24 0449  WBC 19.5* 14.7*  HGB 7.7* 8.9*  HCT 22.9* 27.1*  PLT 456* 379   BMET Recent Labs    01/06/24 1741 01/07/24 0449  NA 130* 134*  K 3.1* 2.6*   CL 94* 98  CO2 19* 23  GLUCOSE 152* 118*  BUN 53* 38*  CREATININE 2.08* 1.37*  CALCIUM  8.8* 8.4*   LFT Recent Labs    01/06/24 1741  PROT 6.9  ALBUMIN  3.7  AST 29  ALT 19  ALKPHOS 123  BILITOT 0.7   PT/INR Recent Labs    01/07/24 0449  LABPROT 14.2  INR 1.1    STUDIES: US  RENAL Result Date: 01/06/2024 CLINICAL DATA:  Acute kidney injury EXAM: RENAL / URINARY TRACT ULTRASOUND COMPLETE COMPARISON:  CT abdomen and pelvis 01/06/2024 FINDINGS: Right Kidney: Renal measurements: 10.3 x 4.4 x 5.8 cm = volume: 130 a mL. Echogenicity within normal limits. There is a simple cyst in the superior pole measuring 1.3 x 1.2 x 1.4 cm. No mass or hydronephrosis visualized. Left Kidney: Renal  measurements: 9.8 x 5.2 x 6.2 cm = volume: 162 mL. Echogenicity within normal limits. No mass or hydronephrosis visualized. There are 2 simple cysts in the left kidney. One measures 1.0 x 0.8 x 1.0 cm. The other measures 9 x 7 x 11 mm. Bladder: Appears normal for degree of bladder distention. Other: None. IMPRESSION: 1. No hydronephrosis. 2. Bilateral simple renal cysts. Electronically Signed   By: Tyron Gallon M.D.   On: 01/06/2024 22:16   CT ABDOMEN PELVIS WO CONTRAST Result Date: 01/06/2024 CLINICAL DATA:  Acute abdominal pain. Acute kidney injury. Diarrhea. Back pain. EXAM: CT ABDOMEN AND PELVIS WITHOUT CONTRAST TECHNIQUE: Multidetector CT imaging of the abdomen and pelvis was performed following the standard protocol without IV contrast. RADIATION DOSE REDUCTION: This exam was performed according to the departmental dose-optimization program which includes automated exposure control, adjustment of the mA and/or kV according to patient size and/or use of iterative reconstruction technique. COMPARISON:  CT 07/06/2023, levin [id number] FINDINGS: Lower chest: No pleural effusion or basilar airspace disease. Coronary artery calcifications/stents. Hepatobiliary: Cholecystectomy. Previous fluid collection in the  cholecystectomy bed has resolved. No evidence of focal liver abnormality or liver lesion on this unenhanced exam. There is no biliary dilatation. Pancreas: No ductal dilatation or inflammation. Spleen: Normal in size without focal abnormality. Adrenals/Urinary Tract: No adrenal nodule. No hydronephrosis. No renal calculi. There is mild left renal atrophy. The small renal cysts and indeterminate renal lesions on prior are not well evaluated in the absence of IV contrast. No further follow-up imaging is recommended. Unremarkable urinary bladder. Stomach/Bowel: Decompressed stomach. No small bowel obstruction or inflammation. Diminutive normal appendix. Small volume of formed stool in the proximal colon. Left colon is decompressed. There is no pericolonic edema or inflammation. Vascular/Lymphatic: Aortic and branch atherosclerosis. No aortic aneurysm. No suspicious lymphadenopathy in the abdomen or pelvis. Reproductive: Uterus and bilateral adnexa are unremarkable. Other: No ascites or free air.  No abdominal wall hernia. Musculoskeletal: L5-S1 facet hypertrophy. There are no acute or suspicious osseous abnormalities. Stable appearance of fat density lesion in the left abductor magnus where included. IMPRESSION: 1. No acute abnormality or explanation for abdominal pain. 2. Mild left renal atrophy. Renal cysts and indeterminate renal lesions on prior exam are not well evaluated in the absence of IV contrast. Aortic Atherosclerosis (ICD10-I70.0). Electronically Signed   By: Chadwick Colonel M.D.   On: 01/06/2024 18:47    Impression / Plan:   Impression: Acute blood loss anemia, acute GI bleed: History of iron deficiency anemia, presented with a 2-day history of melena with diarrhea, nausea and vomiting, on admission hemoglobin 7.7--> 1 unit PRBCs--> 8.9 (14.6 three months ago), some DOE, FOBT positive, CT with no acute abnormality, history of H. pylori gastritis in 2022, successfully treated AKI/AGMA/myoglobinuria:  Creatinine 2.08 on admission-->1.37 overnight, thought prerenal given GI bleed and diarrheal illness Hypovolemic hyponatremia Mild hypokalemia: 3.1-->2.6 overnight CAD status post PCI with a history of MI: Currently holding Aspirin  given GI bleed OSA/COPD  Plan: 1.  Agree with Protonix  40 mg IV twice daily 2.  Patient scheduled for EGD this afternoon at 2:45.  She will need to be n.p.o. after 10:00 this morning.  Discussed this with her and nursing staff.  Until then she is allowed clear liquids and then NPO. 3.  Discussed with hospitalist team about correcting potassium before time of procedure. 4.  Continue to monitor hemoglobin and transfusion as needed less than 8 5.  Continue to hold Plavix  and Aspirin  for now 6.  Please  await any further recommendations after time of procedure.  Thank you for your kind consultation, we will continue to follow.  Kathy Parker [facility]  01/07/2024, 8:39 AM

## 2024-01-07 NOTE — Anesthesia Preprocedure Evaluation (Signed)
 Anesthesia Evaluation  Patient identified by MRN, date of birth, ID band Patient awake    Reviewed: Allergy & Precautions, H&P , NPO status , Patient's Chart, lab work & pertinent test results  Airway Mallampati: II   Neck ROM: full    Dental   Pulmonary asthma , sleep apnea , COPD, Current Smoker and Patient abstained from smoking.   breath sounds clear to auscultation       Cardiovascular hypertension, + CAD, + Past MI, + CABG and + Peripheral Vascular Disease   Rhythm:regular Rate:Normal     Neuro/Psych  PSYCHIATRIC DISORDERS Anxiety Depression Bipolar Disorder Schizophrenia     GI/Hepatic ,GERD  ,,  Endo/Other    Renal/GU      Musculoskeletal  (+) Arthritis ,    Abdominal   Peds  Hematology   Anesthesia Other Findings   Reproductive/Obstetrics                             Anesthesia Physical Anesthesia Plan  ASA: 3  Anesthesia Plan: MAC   Post-op Pain Management:    Induction: Intravenous  PONV Risk Score and Plan: 1 and Propofol  infusion and Treatment may vary due to age or medical condition  Airway Management Planned: Nasal Cannula  Additional Equipment:   Intra-op Plan:   Post-operative Plan:   Informed Consent: I have reviewed the patients History and Physical, chart, labs and discussed the procedure including the risks, benefits and alternatives for the proposed anesthesia with the patient or authorized representative who has indicated his/her understanding and acceptance.     Dental advisory given  Plan Discussed with: CRNA, Anesthesiologist and Surgeon  Anesthesia Plan Comments:        Anesthesia Quick Evaluation

## 2024-01-07 NOTE — Progress Notes (Signed)
 GI panel resulted and pt tested positive for Campylobacter infection. NP Sharion Davidson made aware. Pt remain on Enteric precaution. SRP, RN

## 2024-01-07 NOTE — Transfer of Care (Signed)
 Immediate Anesthesia Transfer of Care Note  Patient: [name]  Procedure(s) Performed: EGD (ESOPHAGOGASTRODUODENOSCOPY)  Patient Location: PACU and Endoscopy Unit  Anesthesia Type:MAC  Level of Consciousness: sedated  Airway & Oxygen Therapy: Patient Spontanous Breathing and Patient connected to face mask oxygen  Post-op Assessment: Report given to RN and Post -op Vital signs reviewed and stable  Post vital signs: Reviewed and stable  Last Vitals:  Vitals Value Taken Time  BP    Temp    Pulse 78 01/07/24 1315  Resp 25 01/07/24 1315  SpO2 96 % 01/07/24 1315  Vitals shown include unfiled device data.  Last Pain:  Vitals:   01/07/24 1233  TempSrc: Temporal  PainSc: 0-No pain         Complications: No notable events documented.

## 2024-01-07 NOTE — Progress Notes (Signed)
 PROGRESS NOTE    [name]  ZOX:[id number] DOB: 10-Jun-1961 DOA: 01/06/2024 PCP: Claudell Cruz, MD    Brief Narrative:  [name] is a 63 y.o. female with medical history significant of iron deficiency anemia, CAD s/p PCI, COPD, HTN, HLD, GERD, obesity, OSA, depression, anxiety, bipolar 1 disorder, PVD, hx of MI presenting to the ED with abdominal pain.    Patient reports that she was in her usual state of health until this past Friday.  States that Friday night she began having nausea and multiple episodes of nonbilious nonbloody emesis.  On Saturday, she had 1 episode of a fully formed black-colored stool.  Following this, she had multiple (greater than 7) episodes of black-colored loose stools throughout the day.  She continued to experience nausea and NBNB emesis yesterday as well.  Given persistence, she came into the ED for further evaluation and care.   Assessment and Plan: Acute blood loss anemia 2/2 acute GI bleed History of iron deficiency anemia Patient presenting with a 2-day history of melena with diarrhea and nausea and vomiting.  On admission, hemoglobin 7.7 which is down from 14.6 about 3 months ago.  She does also endorse associated shortness of breath with exertion since yesterday likely due to precipitous drop.  FOBT positive in the ED.  CT imaging without any acute abnormalities but not performed with contrast given AKI.  Fortunately, she is currently hemodynamically stable.  GI has been consulted by ED provider, will see in consultation tomorrow morning.  Will keep n.p.o. overnight and have added IV PPI twice daily.  Given precipitous drop in hemoglobin and history of CAD status post PCI and MI, will provide 1 unit PRBC with goal hemoglobin greater than 8.  Have discussed this with patient and she agrees with plan.  Will also obtain a GI panel given likely diarrheal illness.  Unable to obtain C. difficile testing given active GI bleed. -GI consulted- plan for  EGD -NPO -1 unit PRBC (RN to order posttransfusion H&H)  AKI AGMA Myoglobinuria Baseline Cr around 0.9-1. On admission, Cr 2.08. Likely prerenal etiology given GI bleeding, diarrheal illness, and resultant dehydration. She also has myoglobinuria on UA likely from significant dehydration as well. CMP also notable for AGMA likely from significant AKI. -normal renal ultrasound -trend kidney function, monitor UOP -avoid nephrotoxic medications as able    Hypovolemic hyponatremia -trending up     hypokalemia -replete agressively prior to EGD   HTN Patient on norvasc  5mg  daily, lopressor  12.5mg  BID, imdur  30mg  daily at home. BP ranging in the 130s-140s/70s-90s. Will hold home antihypertensives to avoid precipitating hypotension given acute GI bleed.  -holding home antihypertensives -trend BP curve, goal MAP >65   Prediabetes with hyperglycemia -last A1c on file from 2020 of 6.1% -not on any medications for this at home -f/u A1c    CAD s/p PCI PVD HLD Hx of MI -resume home lipitor  80mg  daily -holding home ASA given GI bleed -patient also on alirocumab  150mg  every 2 weeks at home (holding while inpatient)   COPD -chronic and stable, no wheezing on exam, saturating well on RA -resume home bronchodilators   OSA -intolerant to CPAP - Recently started Inspire therapy   Depression Anxiety Bipolar 1 disorder -resume home lexapro , lamictal , quetiapine   GERD -IV PPI    Obesity Estimated body mass index is 30.65 kg/m as calculated from the following:   Height as of this encounter: 5' 5.5" (1.664 m).   Weight as of this encounter: 84.8  kg.    DVT prophylaxis: SCDs Start: 01/06/24 2106    Code Status: Full Code   Disposition Plan:  Level of care: Progressive Status is: Inpatient     Consultants:  GI   Subjective: Getting EGD this afternoon Thankful for water  Objective: Vitals:   01/07/24 0636 01/07/24 0818 01/07/24 0820 01/07/24 1039  BP: 123/70   (!)  158/79  Pulse: 75   68  Resp: 16   18  Temp: 98.2 F (36.8 C)   98.2 F (36.8 C)  TempSrc: Oral   Oral  SpO2: 98% 97% 97% 98%  Weight:      Height:        Intake/Output Summary (Last 24 hours) at 01/07/2024 1203 Last data filed at 01/07/2024 1610 Gross per 24 hour  Intake 1543.58 ml  Output --  Net 1543.58 ml   Filed Weights   01/06/24 1749  Weight: 84.8 kg    Examination:   General: Appearance:    Obese female in no acute distress     Lungs:     Clear to auscultation bilaterally, respirations unlabored  Heart:    Normal heart rate. Normal rhythm. No murmurs, rubs, or gallops.    MS:   All extremities are intact.    Neurologic:   Awake, alert, oriented x 3. No apparent focal neurological           defect.        Data Reviewed: I have personally reviewed following labs and imaging studies  CBC: Recent Labs  Lab 01/06/24 1741 01/07/24 0449  WBC 19.5* 14.7*  HGB 7.7* 8.9*  HCT 22.9* 27.1*  MCV 93.9 94.8  PLT 456* 379   Basic Metabolic Panel: Recent Labs  Lab 01/06/24 1741 01/07/24 0448 01/07/24 0449  NA 130*  --  134*  K 3.1*  --  2.6*  CL 94*  --  98  CO2 19*  --  23  GLUCOSE 152*  --  118*  BUN 53*  --  38*  CREATININE 2.08*  --  1.37*  CALCIUM  8.8*  --  8.4*  MG  --  1.7  --    GFR: Estimated Creatinine Clearance: 46.2 mL/min (A) (by C-G formula based on SCr of 1.37 mg/dL (H)). Liver Function Tests: Recent Labs  Lab 01/06/24 1741  AST 29  ALT 19  ALKPHOS 123  BILITOT 0.7  PROT 6.9  ALBUMIN  3.7   Recent Labs  Lab 01/06/24 1741  LIPASE 51   No results for input(s): "AMMONIA" in the last 168 hours. Coagulation Profile: Recent Labs  Lab 01/07/24 0449  INR 1.1   Cardiac Enzymes: No results for input(s): "CKTOTAL", "CKMB", "CKMBINDEX", "TROPONINI" in the last 168 hours. BNP (last 3 results) No results for input(s): "PROBNP" in the last 8760 hours. HbA1C: No results for input(s): "HGBA1C" in the last 72 hours. CBG: Recent Labs   Lab 01/06/24 2227 01/07/24 0803 01/07/24 1123  GLUCAP 179* 114* 145*   Lipid Profile: No results for input(s): "CHOL", "HDL", "LDLCALC", "TRIG", "CHOLHDL", "LDLDIRECT" in the last 72 hours. Thyroid  Function Tests: No results for input(s): "TSH", "T4TOTAL", "FREET4", "T3FREE", "THYROIDAB" in the last 72 hours. Anemia Panel: No results for input(s): "VITAMINB12", "FOLATE", "FERRITIN", "TIBC", "IRON", "RETICCTPCT" in the last 72 hours. Sepsis Labs: No results for input(s): "PROCALCITON", "LATICACIDVEN" in the last 168 hours.  No results found for this or any previous visit (from the past 240 hours).       Radiology Studies: US  RENAL  Result Date: 01/06/2024 CLINICAL DATA:  Acute kidney injury EXAM: RENAL / URINARY TRACT ULTRASOUND COMPLETE COMPARISON:  CT abdomen and pelvis 01/06/2024 FINDINGS: Right Kidney: Renal measurements: 10.3 x 4.4 x 5.8 cm = volume: 130 a mL. Echogenicity within normal limits. There is a simple cyst in the superior pole measuring 1.3 x 1.2 x 1.4 cm. No mass or hydronephrosis visualized. Left Kidney: Renal measurements: 9.8 x 5.2 x 6.2 cm = volume: 162 mL. Echogenicity within normal limits. No mass or hydronephrosis visualized. There are 2 simple cysts in the left kidney. One measures 1.0 x 0.8 x 1.0 cm. The other measures 9 x 7 x 11 mm. Bladder: Appears normal for degree of bladder distention. Other: None. IMPRESSION: 1. No hydronephrosis. 2. Bilateral simple renal cysts. Electronically Signed   By: Tyron Gallon M.D.   On: 01/06/2024 22:16   CT ABDOMEN PELVIS WO CONTRAST Result Date: 01/06/2024 CLINICAL DATA:  Acute abdominal pain. Acute kidney injury. Diarrhea. Back pain. EXAM: CT ABDOMEN AND PELVIS WITHOUT CONTRAST TECHNIQUE: Multidetector CT imaging of the abdomen and pelvis was performed following the standard protocol without IV contrast. RADIATION DOSE REDUCTION: This exam was performed according to the departmental dose-optimization program which includes  automated exposure control, adjustment of the mA and/or kV according to patient size and/or use of iterative reconstruction technique. COMPARISON:  CT 07/06/2023, levin 924 FINDINGS: Lower chest: No pleural effusion or basilar airspace disease. Coronary artery calcifications/stents. Hepatobiliary: Cholecystectomy. Previous fluid collection in the cholecystectomy bed has resolved. No evidence of focal liver abnormality or liver lesion on this unenhanced exam. There is no biliary dilatation. Pancreas: No ductal dilatation or inflammation. Spleen: Normal in size without focal abnormality. Adrenals/Urinary Tract: No adrenal nodule. No hydronephrosis. No renal calculi. There is mild left renal atrophy. The small renal cysts and indeterminate renal lesions on prior are not well evaluated in the absence of IV contrast. No further follow-up imaging is recommended. Unremarkable urinary bladder. Stomach/Bowel: Decompressed stomach. No small bowel obstruction or inflammation. Diminutive normal appendix. Small volume of formed stool in the proximal colon. Left colon is decompressed. There is no pericolonic edema or inflammation. Vascular/Lymphatic: Aortic and branch atherosclerosis. No aortic aneurysm. No suspicious lymphadenopathy in the abdomen or pelvis. Reproductive: Uterus and bilateral adnexa are unremarkable. Other: No ascites or free air.  No abdominal wall hernia. Musculoskeletal: L5-S1 facet hypertrophy. There are no acute or suspicious osseous abnormalities. Stable appearance of fat density lesion in the left abductor magnus where included. IMPRESSION: 1. No acute abnormality or explanation for abdominal pain. 2. Mild left renal atrophy. Renal cysts and indeterminate renal lesions on prior exam are not well evaluated in the absence of IV contrast. Aortic Atherosclerosis (ICD10-I70.0). Electronically Signed   By: Chadwick Colonel M.D.   On: 01/06/2024 18:47        Scheduled Meds:  atorvastatin   80 mg Oral  Daily   budesonide -glycopyrrolate -formoterol   2 puff Inhalation BID   escitalopram   20 mg Oral Daily   lamoTRIgine   100 mg Oral Daily   nicotine   7 mg Transdermal Once   pantoprazole  (PROTONIX ) IV  40 mg Intravenous BID   QUEtiapine   150 mg Oral QHS   Continuous Infusions:  sodium chloride  20 mL/hr at 01/07/24 4098   magnesium  sulfate bolus IVPB     potassium chloride  10 mEq (01/07/24 1158)     LOS: 1 day    Time spent: 45 minutes spent on chart review, discussion with nursing staff, consultants, updating family and interview/physical exam; more  than 50% of that time was spent in counseling and/or coordination of care.    Enrigue Harvard, DO [facility] Available via Epic secure chat 7am-7pm After these hours, please refer to coverage provider listed on [email or web address] 01/07/2024, 12:03 PM

## 2024-01-07 NOTE — TOC Initial Note (Signed)
 Transition of Care [facility]) - Initial/Assessment Note    Patient Details  Name: [name] MRN: [id number] Date of Birth: [birth year]  [facility] of [facility]) CM/SW Contact:    Ruben Corolla, RN Phone Number: 01/07/2024, 3:51 PM  Clinical Narrative:d/c plan home.                   Expected Discharge Plan: Home/Self Care Barriers to Discharge: Continued Medical Work up   Patient Goals and CMS Choice   CMS Medicare.gov Compare Post Acute Care list provided to:: Patient Choice offered to / list presented to : Patient [place] ownership interest in [facility].provided to:: Patient    Expected Discharge Plan and Services                                              Prior Living Arrangements/Services                       Activities of Daily Living   ADL Screening (condition at time of admission) Independently performs ADLs?: Yes (appropriate for developmental age) Is the patient deaf or have difficulty hearing?: No Does the patient have difficulty seeing, even when wearing glasses/contacts?: No Does the patient have difficulty concentrating, remembering, or making decisions?: No  Permission Sought/Granted                  Emotional Assessment              Admission diagnosis:  Acute lower GI bleeding [K92.2] Generalized abdominal pain [R10.84] Acute GI bleeding [K92.2] AKI (acute kidney injury) (HCC) [N17.9] Patient Active Problem List   Diagnosis Date Noted   Gastritis and gastroduodenitis 01/07/2024   Multiple gastric ulcers 01/07/2024   Acute GI bleeding 01/06/2024   Intolerance of continuous positive airway pressure (CPAP) ventilation 10/10/2023   Symptomatic anemia 06/30/2023   Postoperative or surgical complication 06/30/2023   COPD with chronic bronchitis and emphysema (HCC) 05/02/2023   GERD (gastroesophageal reflux disease) 05/02/2023   OSA (obstructive sleep apnea) 05/02/2023   Current smoker 05/02/2023    Calculus of gallbladder without cholecystitis without obstruction 12/25/2022   RUQ abdominal pain 12/25/2022   Dysphagia 12/25/2022   History of Helicobacter pylori infection 12/25/2022   Chronic constipation 04/15/2020   CAD- S/P PCI 07/03/2019   Hx of CABG 07/03/2019   History of non-ST elevation myocardial infarction (NSTEMI)    Essential hypertension 03/17/2015   Hyperlipidemia LDL goal <70 12/24/2013   Coronary artery disease involving coronary bypass graft 12/24/2013   Expected blood loss anemia 10/22/2012   Obesity 10/22/2012   S/P right TKA 10/21/2012   PCP:  Claudell Cruz, MD Pharmacy:   [id number] - [place], [place] - [address] [place] [address] Phone: [phone] Fax: [phone]     Social Drivers of Health (SDOH) Social History: SDOH Screenings   Food Insecurity: No Food Insecurity (01/07/2024)  Housing: Low Risk  (01/07/2024)  Transportation Needs: No Transportation Needs (01/07/2024)  Utilities: Not At Risk (01/07/2024)  Financial Resource Strain: Low Risk  (09/04/2023)   Received from [facility]  Physical Activity: Insufficiently Active (04/26/2023)   Received from [facility]  Social Connections: Somewhat Isolated (04/26/2023)   Received from [facility]  Stress: Stress Concern Present (04/26/2023)   [facility]  Tobacco Use: High Risk (  01/07/2024)   SDOH Interventions:     Readmission Risk Interventions     No data to display

## 2024-01-07 NOTE — H&P (Signed)
 GASTROENTEROLOGY PROCEDURE H&P NOTE   Primary Care Physician: Claudell Cruz, MD  HPI: [name] is a 63 y.o. female who presents for EGD for evaluation of acute blood loss anemia, dark stools, abdominal pain in setting of Plavix  use (last dose Friday) and associated nausea and vomiting.  Past Medical History:  Diagnosis Date   Allergy    Anemia    takes iron supplement   Arthritis    knees, shoulders   Asthma    Bipolar 1 disorder (HCC)    Blood transfusion without reported diagnosis    Cataract, immature    CHF (congestive heart failure) (HCC)    Clotting disorder (HCC)    she denied any specific genetic bleeding/clotting diagnosis (10/05/23)   COPD (chronic obstructive pulmonary disease) (HCC)    Coronary artery disease    triple vessel   Depression    Dyspnea    Family history of adverse reaction to anesthesia    states father "did not do well" with anesthesia; died last time he had surgery; also had heart disease   Gallstones    GERD (gastroesophageal reflux disease)    occasional - no current med.   Glaucoma    Hammertoe 05/2015   right second toe   Heart murmur    Hepatic steatosis    High cholesterol    History of MI (myocardial infarction)    Hypertension    states under control with meds., has been on med. x 8 yr.   Myocardial infarction (HCC)    Osteoporosis    Panic attacks    Peripheral vascular disease (HCC)    carotid   Renal cyst, left    Schizophrenia (HCC)    Short of breath on exertion    Sleep apnea    Inspire in future   Urinary, incontinence, stress female    Wears dentures    upper   Wears partial dentures    lower   Past Surgical History:  Procedure Laterality Date   C-SECTIONS X 2  1985, 1988   CARDIAC CATHETERIZATION  12/10/2006; 12/18/2006; 07/10/2007; 07/19/2009   CARDIAC CATHETERIZATION  03/04/2010   patent grafts, normal LV function, diffusely diseased RCA (Dr. Aleda Ammon)   CARDIAC CATHETERIZATION N/A 02/15/2016    Procedure: Left Heart Cath and Cors/Grafts Angiography;  Surgeon: Peter M Swaziland, MD;  Location: [facility] INVASIVE CV LAB;  Service: Cardiovascular;  Laterality: N/A;   CHOLECYSTECTOMY N/A 06/29/2023   Procedure: LAPAROSCOPIC CHOLECYSTECTOMY WITH ICG DYE;  Surgeon: Aldean Hummingbird, MD;  Location: [facility] ORS;  Service: General;  Laterality: N/A;   CLUB FOOT RELEASE Bilateral    1st and 2nd toes   COLONOSCOPY     CORONARY ARTERY BYPASS GRAFT  07/16/2007   LIMA to LAD, VG to ramus, VG to PDA    CORONARY STENT INTERVENTION N/A 06/23/2019   Procedure: CORONARY STENT INTERVENTION;  Surgeon: Avanell Leigh, MD;  Location: [facility] INVASIVE CV LAB;  Service: Cardiovascular;  Laterality: N/A;   DRUG INDUCED ENDOSCOPY N/A 05/17/2023   Procedure: DRUG INDUCED ENDOSCOPY;  Surgeon: Lind Repine, MD;  Location: [facility] ENDOSCOPY;  Service: Pulmonary;  Laterality: N/A;   FLEXIBLE BRONCHOSCOPY  07/16/2007   HAMMERTOE RECONSTRUCTION WITH WEIL OSTEOTOMY Right 06/03/2015   Procedure: RIGHT SECOND HAMMERTOE CORRECTION WITH MT WEIL OSTEOTOMY;  Surgeon: Amada Backer, MD;  Location: [facility];  Service: Orthopedics;  Laterality: Right;   HAND SURGERY Bilateral    1st and 2nd fingers - release of clubbing  IMPLANTATION OF HYPOGLOSSAL NERVE STIMULATOR N/A 10/10/2023   Procedure: IMPLANTATION OF HYPOGLOSSAL NERVE STIMULATOR;  Surgeon: Artice Last, MD;  Location: [facility] OR;  Service: ENT;  Laterality: N/A;  right   KNEE ARTHROSCOPY WITH ANTERIOR CRUCIATE LIGAMENT (ACL) REPAIR Right 01/30/2008   LEFT HEART CATH AND CORS/GRAFTS ANGIOGRAPHY N/A 06/23/2019   Procedure: LEFT HEART CATH AND CORS/GRAFTS ANGIOGRAPHY;  Surgeon: Avanell Leigh, MD;  Location: [facility] INVASIVE CV LAB;  Service: Cardiovascular;  Laterality: N/A;   LEFT HEART CATH AND CORS/GRAFTS ANGIOGRAPHY N/A 03/01/2020   Procedure: LEFT HEART CATH AND CORS/GRAFTS ANGIOGRAPHY;  Surgeon: Arty Binning, MD;  Location: [facility] INVASIVE CV LAB;  Service: Cardiovascular;   Laterality: N/A;   NOVASURE ABLATION  12/19/2007   with removal of IUD   ORIF FOOT FRACTURE Left    TOE SURGERIES TO REMOVE NAILS DUE TO CLUBBING     TOTAL KNEE ARTHROPLASTY Right 10/21/2012   Procedure: TOTAL KNEE ARTHROPLASTY;  Surgeon: Bevin Bucks, MD;  Location: [facility] ORS;  Service: Orthopedics;  Laterality: Right;   UPPER GASTROINTESTINAL ENDOSCOPY     Current Facility-Administered Medications  Medication Dose Route Frequency Provider Last Rate Last Admin   0.9 %  sodium chloride  infusion   Intravenous Continuous Graciella Lavender, [place] 20 mL/hr at 01/07/24 0952 New Bag at 01/07/24 0952   [MAR Hold] acetaminophen  (TYLENOL ) tablet 650 mg  650 mg Oral Q6H PRN Jinwala, Sagar H, MD       Or   Evette Hoes Hold] acetaminophen  (TYLENOL ) suppository 650 mg  650 mg Rectal Q6H PRN Jinwala, Sagar H, MD       [MAR Hold] albuterol  (PROVENTIL ) (2.5 MG/3ML) 0.083% nebulizer solution 3 mL  3 mL Inhalation Q6H PRN Jinwala, Sagar H, MD       [MAR Hold] atorvastatin  (LIPITOR ) tablet 80 mg  80 mg Oral Daily Jinwala, Sagar H, MD   80 mg at 01/07/24 0948   [MAR Hold] budesonide -glycopyrrolate -formoterol  (BREZTRI ) 160-9-4.8 MCG/ACT inhaler 2 puff  2 puff Inhalation BID Jinwala, Sagar H, MD   2 puff at 01/07/24 0818   [MAR Hold] escitalopram  (LEXAPRO ) tablet 20 mg  20 mg Oral Daily Jinwala, Sagar H, MD   20 mg at 01/07/24 0948   [MAR Hold] lamoTRIgine  (LAMICTAL ) tablet 100 mg  100 mg Oral Daily Jinwala, Sagar H, MD   100 mg at 01/07/24 0948   [MAR Hold] magnesium  sulfate IVPB 2 g 50 mL  2 g Intravenous Once Vann, Jessica U, DO       nicotine  (NICODERM CQ  - dosed in mg/24 hr) patch 7 mg  7 mg Transdermal Once Daniels, James K, NP   7 mg at 01/07/24 0545   [MAR Hold] pantoprazole  (PROTONIX ) injection 40 mg  40 mg Intravenous BID Jinwala, Sagar H, MD   40 mg at 01/07/24 0930   potassium chloride  10 mEq in 100 mL IVPB  10 mEq Intravenous Q1 Hr x 6 Daniels, James K, NP 100 mL/hr at 01/07/24 1158 10 mEq at 01/07/24 1158    [MAR Hold] QUEtiapine  (SEROQUEL ) tablet 150 mg  150 mg Oral QHS Jinwala, Sagar H, MD       [MAR Hold] trimethobenzamide  (TIGAN ) injection 200 mg  200 mg Intramuscular Q6H PRN Jinwala, Sagar H, MD   200 mg at 01/07/24 1052    Current Facility-Administered Medications:    0.9 %  sodium chloride  infusion, , Intravenous, Continuous, Graciella Lavender, [place], Last Rate: 20 mL/hr at 01/07/24 0952, New Bag at 01/07/24 [phone]   [  MAR Hold] acetaminophen  (TYLENOL ) tablet 650 mg, 650 mg, Oral, Q6H PRN **OR** [MAR Hold] acetaminophen  (TYLENOL ) suppository 650 mg, 650 mg, Rectal, Q6H PRN, Jinwala, Sagar H, MD   [MAR Hold] albuterol  (PROVENTIL ) (2.5 MG/3ML) 0.083% nebulizer solution 3 mL, 3 mL, Inhalation, Q6H PRN, Jinwala, Sagar H, MD   [MAR Hold] atorvastatin  (LIPITOR ) tablet 80 mg, 80 mg, Oral, Daily, Jinwala, Sagar H, MD, 80 mg at 01/07/24 0948   [MAR Hold] budesonide -glycopyrrolate -formoterol  (BREZTRI ) 160-9-4.8 MCG/ACT inhaler 2 puff, 2 puff, Inhalation, BID, Jinwala, Sagar H, MD, 2 puff at 01/07/24 0818   [MAR Hold] escitalopram  (LEXAPRO ) tablet 20 mg, 20 mg, Oral, Daily, Jinwala, Sagar H, MD, 20 mg at 01/07/24 0948   [MAR Hold] lamoTRIgine  (LAMICTAL ) tablet 100 mg, 100 mg, Oral, Daily, Jinwala, Sagar H, MD, 100 mg at 01/07/24 0948   [MAR Hold] magnesium  sulfate IVPB 2 g 50 mL, 2 g, Intravenous, Once, Vann, Jessica U, DO   nicotine  (NICODERM CQ  - dosed in mg/24 hr) patch 7 mg, 7 mg, Transdermal, Once, Daniels, James K, NP, 7 mg at 01/07/24 0545   [MAR Hold] pantoprazole  (PROTONIX ) injection 40 mg, 40 mg, Intravenous, BID, Jinwala, Sagar H, MD, 40 mg at 01/07/24 0930   potassium chloride  10 mEq in 100 mL IVPB, 10 mEq, Intravenous, Q1 Hr x 6, Olena Bernard, NP, Last Rate: 100 mL/hr at 01/07/24 1158, 10 mEq at 01/07/24 1158   [MAR Hold] QUEtiapine  (SEROQUEL ) tablet 150 mg, 150 mg, Oral, QHS, Jinwala, Sagar H, MD   [MAR Hold] trimethobenzamide  (TIGAN ) injection 200 mg, 200 mg, Intramuscular, Q6H PRN,  Jinwala, Sagar H, MD, 200 mg at 01/07/24 1052 Allergies  Allergen Reactions   Benadryl  [Diphenhydramine  Hcl] Shortness Of Breath and Swelling    Tongue swelling   Codeine Other (See Comments)    "makes me psychotic"    Haldol [Haloperidol Decanoate] Anaphylaxis   Amoxicillin Hives and Swelling    Swelling to extremities Did it involve swelling of the face/tongue/throat, SOB, or low BP? Yes Did it involve sudden or severe rash/hives, skin peeling, or any reaction on the inside of your mouth or nose? Yes Did you need to seek medical attention at a hospital or doctor's office? Yes When did it last happen?    young adult   If all above answers are "NO", may proceed with cephalosporin use.   Bee Venom Swelling and Other (See Comments)    "pimples" from head to toe and whole body swelling   Family History  Problem Relation Age of Onset   Lung cancer Mother    Kidney cancer Mother    Heart disease Father    Anesthesia problems Father        states "did not do well" with anesthesia   Colon polyps Sister    Breast cancer Maternal Aunt    Breast cancer Maternal Aunt    Stomach cancer Neg Hx    Esophageal cancer Neg Hx    Pancreatic cancer Neg Hx    Rectal cancer Neg Hx    Colon cancer Neg Hx    Social History   Socioeconomic History   Marital status: Divorced    Spouse name: Not on file   Number of children: Not on file   Years of education: Not on file   Highest education level: Not on file  Occupational History   Not on file  Tobacco Use   Smoking status: Some Days    Current packs/day: 2.00    Average packs/day: 2.0 packs/day  for 38.4 years (76.8 ttl pk-yrs)    Types: Cigarettes    Start date: 08/21/1985   Smokeless tobacco: Never   Tobacco comments:    10/23/2023 patient smoke a pack of cigarettes a month  Vaping Use   Vaping status: Never Used  Substance and Sexual Activity   Alcohol use: No   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   Sexual activity: Not  on file  Other Topics Concern   Not on file  Social History Narrative   Not on file   Social Drivers of Health   Financial Resource Strain: Low Risk  (09/04/2023)   Received from [facility]   Overall Financial Resource Strain (CARDIA)    Difficulty of Paying Living Expenses: Not hard at all  Food Insecurity: No Food Insecurity (01/07/2024)   Hunger Vital Sign    Worried About Running Out of Food in the Last Year: Never true    Ran Out of Food in the Last Year: Never true  Transportation Needs: No Transportation Needs (01/07/2024)   PRAPARE - Administrator, Civil Service (Medical): No    Lack of Transportation (Non-Medical): No  Physical Activity: Insufficiently Active (04/26/2023)   Received from [facility]   Exercise Vital Sign    Days of Exercise per Week: 1 day    Minutes of Exercise per Session: 50 min  Stress: Stress Concern Present (04/26/2023)   Received from [facility] Occupational Health - Occupational Stress Questionnaire    Feeling of Stress : To some extent  Social Connections: Somewhat Isolated (04/26/2023)   Received from [facility]   Social Network    How would you rate your social network (family, work, friends)?: Restricted participation with some degree of social isolation  Intimate Partner Violence: Not At Risk (01/07/2024)   Humiliation, Afraid, Rape, and Kick questionnaire    Fear of Current or Ex-Partner: No    Emotionally Abused: No    Physically Abused: No    Sexually Abused: No    Physical Exam: Today's Vitals   01/07/24 0820 01/07/24 0939 01/07/24 1039 01/07/24 1233  BP:   (!) 158/79 (!) 148/86  Pulse:   68 66  Resp:   18 12  Temp:   98.2 F (36.8 C) 98.4 F (36.9 C)  TempSrc:   Oral Temporal  SpO2: 97%  98% 93%  Weight:      Height:      PainSc:  0-No pain  0-No pain   Body mass index is 30.65 kg/m. GEN: NAD EYE: Sclerae anicteric ENT: MMM CV: Non-tachycardic GI: Soft, NT/ND NEURO:  Alert &  Oriented x 3  Lab Results: Recent Labs    01/06/24 1741 01/07/24 0449  WBC 19.5* 14.7*  HGB 7.7* 8.9*  HCT 22.9* 27.1*  PLT 456* 379   BMET Recent Labs    01/06/24 1741 01/07/24 0449  NA 130* 134*  K 3.1* 2.6*  CL 94* 98  CO2 19* 23  GLUCOSE 152* 118*  BUN 53* 38*  CREATININE 2.08* 1.37*  CALCIUM  8.8* 8.4*   LFT Recent Labs    01/06/24 1741  PROT 6.9  ALBUMIN  3.7  AST 29  ALT 19  ALKPHOS 123  BILITOT 0.7   PT/INR Recent Labs    01/07/24 0449  LABPROT 14.2  INR 1.1     Impression / Plan: This is a 63 y.o.female who presents for EGD for evaluation of  acute blood loss anemia, dark stools, abdominal pain in setting of Plavix  use (last dose Friday) and associated nausea and vomiting.  The risks and benefits of endoscopic evaluation/treatment were discussed with the patient and/or family; these include but are not limited to the risk of perforation, infection, bleeding, missed lesions, lack of diagnosis, severe illness requiring hospitalization, as well as anesthesia and sedation related illnesses.  The patient's history has been reviewed, patient examined, no change in status, and deemed stable for procedure.  The patient and/or family is agreeable to proceed.    Yong Henle, MD [facility]oscopy Office # [phone]

## 2024-01-08 ENCOUNTER — Other Ambulatory Visit (HOSPITAL_COMMUNITY): Payer: Self-pay

## 2024-01-08 DIAGNOSIS — D62 Acute posthemorrhagic anemia: Secondary | ICD-10-CM | POA: Diagnosis not present

## 2024-01-08 DIAGNOSIS — Z7902 Long term (current) use of antithrombotics/antiplatelets: Secondary | ICD-10-CM | POA: Diagnosis not present

## 2024-01-08 DIAGNOSIS — K922 Gastrointestinal hemorrhage, unspecified: Secondary | ICD-10-CM | POA: Diagnosis not present

## 2024-01-08 LAB — CBC
HCT: 25.3 % — ABNORMAL LOW (ref 36.0–46.0)
Hemoglobin: 8.5 g/dL — ABNORMAL LOW (ref 12.0–15.0)
MCH: 31.6 pg (ref 26.0–34.0)
MCHC: 33.6 g/dL (ref 30.0–36.0)
MCV: 94.1 fL (ref 80.0–100.0)
Platelets: 385 10*3/uL (ref 150–400)
RBC: 2.69 MIL/uL — ABNORMAL LOW (ref 3.87–5.11)
RDW: 15.5 % (ref 11.5–15.5)
WBC: 12.5 10*3/uL — ABNORMAL HIGH (ref 4.0–10.5)
nRBC: 0.7 % — ABNORMAL HIGH (ref 0.0–0.2)

## 2024-01-08 LAB — BASIC METABOLIC PANEL WITH GFR
Anion gap: 10 (ref 5–15)
BUN: 19 mg/dL (ref 8–23)
CO2: 25 mmol/L (ref 22–32)
Calcium: 8.5 mg/dL — ABNORMAL LOW (ref 8.9–10.3)
Chloride: 101 mmol/L (ref 98–111)
Creatinine, Ser: 0.96 mg/dL (ref 0.44–1.00)
GFR, Estimated: >60 mL/min (ref >60)
Glucose, Bld: 114 mg/dL — ABNORMAL HIGH (ref 70–99)
Potassium: 3.2 mmol/L — ABNORMAL LOW (ref 3.5–5.1)
Sodium: 136 mmol/L (ref 135–145)

## 2024-01-08 LAB — SURGICAL PATHOLOGY

## 2024-01-08 LAB — GLUCOSE, CAPILLARY: Glucose-Capillary: 122 mg/dL — ABNORMAL HIGH (ref 70–99)

## 2024-01-08 MED ORDER — RANOLAZINE ER 500 MG PO TB12
1000.0000 mg | ORAL_TABLET | Freq: Two times a day (BID) | ORAL | Status: DC
Start: 2024-01-08 — End: 2024-01-08
  Filled 2024-01-08: qty 2

## 2024-01-08 MED ORDER — ASPIRIN 81 MG PO TBEC: 81.0000 mg | DELAYED_RELEASE_TABLET | Freq: Every day | ORAL | Status: AC

## 2024-01-08 MED ORDER — ISOSORBIDE MONONITRATE ER 60 MG PO TB24
90.0000 mg | ORAL_TABLET | Freq: Every day | ORAL | Status: DC
  Administered 2024-01-08: 90 mg via ORAL
  Filled 2024-01-08: qty 1

## 2024-01-08 MED ORDER — POTASSIUM CHLORIDE CRYS ER 20 MEQ PO TBCR
40.0000 meq | EXTENDED_RELEASE_TABLET | Freq: Once | ORAL | Status: AC
  Administered 2024-01-08: 40 meq via ORAL
  Filled 2024-01-08: qty 2

## 2024-01-08 MED ORDER — METOPROLOL TARTRATE 25 MG PO TABS
12.5000 mg | ORAL_TABLET | Freq: Two times a day (BID) | ORAL | Status: DC
  Administered 2024-01-08: 12.5 mg via ORAL
  Filled 2024-01-08: qty 1

## 2024-01-08 MED ORDER — AMLODIPINE BESYLATE 5 MG PO TABS
5.0000 mg | ORAL_TABLET | Freq: Every day | ORAL | Status: DC
  Administered 2024-01-08: 5 mg via ORAL
  Filled 2024-01-08: qty 1

## 2024-01-08 MED ORDER — SUCRALFATE 1 G PO TABS
ORAL_TABLET | ORAL | 0 refills | Status: DC
  Filled 2024-01-08: qty 84, 28d supply, fill #0

## 2024-01-08 MED ORDER — CLOPIDOGREL BISULFATE 75 MG PO TABS: 75.0000 mg | ORAL_TABLET | Freq: Every day | ORAL | Status: DC

## 2024-01-08 MED ORDER — PANTOPRAZOLE SODIUM 40 MG PO TBEC
40.0000 mg | DELAYED_RELEASE_TABLET | Freq: Two times a day (BID) | ORAL | 0 refills | Status: DC
  Filled 2024-01-08 (×2): qty 60, 30d supply, fill #0

## 2024-01-08 NOTE — Anesthesia Postprocedure Evaluation (Signed)
 Anesthesia Post Note  Patient: [name]  Procedure([place]) Performed: EGD (ESOPHAGOGASTRODUODENOSCOPY)     Patient location during evaluation: Endoscopy Anesthesia Type: MAC Level of consciousness: awake and alert Pain management: pain level controlled Vital Signs Assessment: post-procedure vital signs reviewed and stable Respiratory status: spontaneous breathing, nonlabored ventilation, respiratory function stable and patient connected to nasal cannula oxygen Cardiovascular status: stable and blood pressure returned to baseline Postop Assessment: no apparent nausea or vomiting Anesthetic complications: no   No notable events documented.  Last Vitals:  Vitals:   01/08/24 0746 01/08/24 0747  BP:    Pulse:    Resp:    Temp:    SpO2: 98% 98%    Last Pain:  Vitals:   01/08/24 0903  TempSrc:   PainSc: 9                  [name]

## 2024-01-08 NOTE — Progress Notes (Signed)
 Progress Note   Subjective  Chief Complaint: Anemia, GI bleed  EGD 01/08/2024 with 3 cm hiatal hernia, 3 nonbleeding gastric ulcers with a clean ulcer base found in the antral region and gastritis as well as duodenitis.  Patient told to hold Plavix  for a total of 72 hours to allow for further healing.  Increase PPI to 40 mg twice daily and start Carafate  1 g 4 times daily for 2 weeks and then twice daily for a month.  Repeat EGD in 4 months to check with healing, she be scheduled with colonoscopy that she is due for for surveillance.  Today, the patient is in much better spirits.  She tells me that she is hungry though because she has not really liked the food here in the hospital.  She is eager to get back home to her cats.  Denies any new complaints or concerns.  No further vomiting, nausea or epigastric pain.   Objective   Vital signs in last 24 hours: Temp:  [98.2 F (36.8 C)-99.1 F (37.3 C)] 99.1 F (37.3 C) (05/20 0351) Pulse Rate:  [66-78] 78 (05/20 0351) Resp:  [12-25] 17 (05/20 0351) BP: (148-171)/(50-86) 153/84 (05/20 0351) SpO2:  [93 %-98 %] 98 % (05/20 0747) Last BM Date : 01/07/24 General:    white female in NAD Heart:  Regular rate and rhythm; no murmurs Lungs: Respirations even and unlabored, lungs CTA bilaterally Abdomen:  Soft, nontender and nondistended. Normal bowel sounds. Psych:  Cooperative. Normal mood and affect.  Intake/Output from previous day: 05/19 0701 - 05/20 0700 In: 1752.8 [P.O.:480; I.V.:700; IV Piggyback:572.8] Out: -    Lab Results: Recent Labs    01/06/24 1741 01/07/24 0449 01/08/24 0635  WBC 19.5* 14.7* 12.5*  HGB 7.7* 8.9* 8.5*  HCT 22.9* 27.1* 25.3*  PLT 456* 379 385   BMET Recent Labs    01/06/24 1741 01/07/24 0449 01/07/24 1555 01/08/24 0635  NA 130* 134*  --  136  K 3.1* 2.6* 3.4* 3.2*  CL 94* 98  --  101  CO2 19* 23  --  25  GLUCOSE 152* 118*  --  114*  BUN 53* 38*  --  19  CREATININE 2.08* 1.37*  --  0.96   CALCIUM  8.8* 8.4*  --  8.5*   LFT Recent Labs    01/06/24 1741  PROT 6.9  ALBUMIN  3.7  AST 29  ALT 19  ALKPHOS 123  BILITOT 0.7   PT/INR Recent Labs    01/07/24 0449  LABPROT 14.2  INR 1.1    Studies/Results: US  RENAL Result Date: 01/06/2024 CLINICAL DATA:  Acute kidney injury EXAM: RENAL / URINARY TRACT ULTRASOUND COMPLETE COMPARISON:  CT abdomen and pelvis 01/06/2024 FINDINGS: Right Kidney: Renal measurements: 10.3 x 4.4 x 5.8 cm = volume: 130 a mL. Echogenicity within normal limits. There is a simple cyst in the superior pole measuring 1.3 x 1.2 x 1.4 cm. No mass or hydronephrosis visualized. Left Kidney: Renal measurements: 9.8 x 5.2 x 6.2 cm = volume: 162 mL. Echogenicity within normal limits. No mass or hydronephrosis visualized. There are 2 simple cysts in the left kidney. One measures 1.0 x 0.8 x 1.0 cm. The other measures 9 x 7 x 11 mm. Bladder: Appears normal for degree of bladder distention. Other: None. IMPRESSION: 1. No hydronephrosis. 2. Bilateral simple renal cysts. Electronically Signed   By: Tyron Gallon M.D.   On: 01/06/2024 22:16   CT ABDOMEN PELVIS WO CONTRAST Result Date: 01/06/2024 CLINICAL  DATA:  Acute abdominal pain. Acute kidney injury. Diarrhea. Back pain. EXAM: CT ABDOMEN AND PELVIS WITHOUT CONTRAST TECHNIQUE: Multidetector CT imaging of the abdomen and pelvis was performed following the standard protocol without IV contrast. RADIATION DOSE REDUCTION: This exam was performed according to the departmental dose-optimization program which includes automated exposure control, adjustment of the mA and/or kV according to patient size and/or use of iterative reconstruction technique. COMPARISON:  CT 07/06/2023, levin [id number] FINDINGS: Lower chest: No pleural effusion or basilar airspace disease. Coronary artery calcifications/stents. Hepatobiliary: Cholecystectomy. Previous fluid collection in the cholecystectomy bed has resolved. No evidence of focal liver abnormality or  liver lesion on this unenhanced exam. There is no biliary dilatation. Pancreas: No ductal dilatation or inflammation. Spleen: Normal in size without focal abnormality. Adrenals/Urinary Tract: No adrenal nodule. No hydronephrosis. No renal calculi. There is mild left renal atrophy. The small renal cysts and indeterminate renal lesions on prior are not well evaluated in the absence of IV contrast. No further follow-up imaging is recommended. Unremarkable urinary bladder. Stomach/Bowel: Decompressed stomach. No small bowel obstruction or inflammation. Diminutive normal appendix. Small volume of formed stool in the proximal colon. Left colon is decompressed. There is no pericolonic edema or inflammation. Vascular/Lymphatic: Aortic and branch atherosclerosis. No aortic aneurysm. No suspicious lymphadenopathy in the abdomen or pelvis. Reproductive: Uterus and bilateral adnexa are unremarkable. Other: No ascites or free air.  No abdominal wall hernia. Musculoskeletal: L5-S1 facet hypertrophy. There are no acute or suspicious osseous abnormalities. Stable appearance of fat density lesion in the left abductor magnus where included. IMPRESSION: 1. No acute abnormality or explanation for abdominal pain. 2. Mild left renal atrophy. Renal cysts and indeterminate renal lesions on prior exam are not well evaluated in the absence of IV contrast. Aortic Atherosclerosis (ICD10-I70.0). Electronically Signed   By: Chadwick Colonel M.D.   On: 01/06/2024 18:47     Assessment / Plan:   Assessment: 1.  Acute blood loss anemia, acute GI bleed: History of iron deficiency anemia, presented with a 2-day history of melena with diarrhea, nausea and vomiting, admission hemoglobin 7.7--> 1 unit PRBCs--> 8.9 (14.6 3 months ago)--> 8.5 overnight, some DOE, FOBT positive, CT with no acute abnormality, history of H. pylori gastritis in 2022 successfully treated, EGD yesterday with 3 gastric ulcers nonbleeding which are thought to be the cause 2.   AKI/AGMA/myoglobinuria 3.  Hypovolemic/hyponatremia 4.  Mild hypokalemia: Corrected 5.  CAD status post PCI with a history of MI 6.  OSA/COPD  Plan: 1.  See Dr. Marolyn Sis procedure note.  Recommendations to continue holding Plavix  for total of 72 hours to allow for further healing of gastric ulcers and inflammation, if aspirin  needs to be restarted can restart today.  2.  Increase PPI to 40 mg twice a day 3.  Start Carafate  1 g 4 times daily x 2 weeks and then twice daily for a month 4.  Patient will need repeat EGD in 4 months to check on healing and can pursue colonoscopy at the same time as she is due for surveillance 5.  Will arrange for follow-up in clinic with Dr. Bridgett Camps and/or one of the apps in the next 4 weeks, at that time can be set up for repeat EGD and colonoscopy  Thank you for your kind consultation, we will sign off.    LOS: 2 days   Graciella Lavender  01/08/2024, 10:05 AM

## 2024-01-08 NOTE — Discharge Summary (Addendum)
 Physician Discharge Summary  [name] WGN:[id number] DOB: March 08, 1961 DOA: 01/06/2024  PCP: Claudell Cruz, MD  Admit date: 01/06/2024 Discharge date: 01/08/2024   Discharge disposition: Home   Recommendations for Outpatient Follow-Up:   Increase PPI to 40 mg twice a day Start Carafate  1 g 4 times daily x 2 weeks and then twice daily for a month repeat EGD in 4 months to check on healing and can pursue colonoscopy at the same time as she is due for surveillance  follow-up in clinic with Dr. Bridgett Camps and/or one of the apps in the next 4 weeks, at that time can be set up for repeat EGD and colonoscopy Resume aspirin  but enteric-coated Would recommend follow-up with cardiology to discuss need for aspirin /Plavix  going forward BMP 1 week   Discharge Diagnosis:   Principal Problem:   Acute GI bleeding Active Problems:   Expected blood loss anemia   Gastritis and gastroduodenitis   Multiple gastric ulcers    Discharge Condition: Improved.  Diet recommendation: Low sodium, heart healthy.  Carbohydrate-modified  Wound care: None.  Code status: Full.   History of Present Illness:   [name] is a 63 y.o. female with medical history significant of iron deficiency anemia, CAD s/p PCI, COPD, HTN, HLD, GERD, obesity, OSA, depression, anxiety, bipolar 1 disorder, PVD, hx of MI presenting to the ED with abdominal pain.    Patient reports that she was in her usual state of health until this past Friday.  States that Friday night she began having nausea and multiple episodes of nonbilious nonbloody emesis.  On Saturday, she had 1 episode of a fully formed black-colored stool.  Following this, she had multiple (greater than 7) episodes of black-colored loose stools throughout the day.  She continued to experience nausea and NBNB emesis yesterday as well.  Given persistence, she came into the ED for further evaluation and care.  She denies any fevers, chills, chest pain,  palpitations, constipation, urinary changes.  Does state that she has been unable to eat or drink much since yesterday given ongoing nausea and vomiting.  She also endorses associated shortness of breath with exertion beginning yesterday.   ED course: Vital signs stable. CBC with leukocytosis to 19.5, hgb 7.7 (down from 14.6 about 3 months ago), reactive thrombocytosis. CMP with sodium 130, K 3.1, bicarbonate 19, glucose 152, Cr 2.08, BUN 53, AG 17. Lipase normal. FOBT positive. Urinalysis with myoglobinuria. EKG with sinus rhythm, LVH, and prolonged Qtc ( ). CT abd/pel without contrast showing no acute abnormalities. ED provider consulted GI who recommended medical admission and will formally see tomorrow. Patient given 2L IVF bolus, IV PPI, and IV reglan  in the ED. TRH asked to evaluate patient for admission.   Hospital Course by Problem:   Acute blood loss anemia 2/2 acute GI bleed History of iron deficiency anemia Patient presenting with a 2-day history of melena with diarrhea and nausea and vomiting.  On admission, hemoglobin 7.7 which is down from 14.6 about 3 months ago.  She does also endorse associated shortness of breath with exertion since yesterday likely due to precipitous drop.  FOBT positive in the ED.  CT imaging without any acute abnormalities but not performed with contrast given AKI.  Fortunately, she is currently hemodynamically stable.  GI has been consulted by ED provider, will see in consultation tomorrow morning.  Will keep n.p.o. overnight and have added IV PPI twice daily.  Given precipitous drop in hemoglobin and history of  CAD status post PCI and MI, will provide 1 unit PRBC with goal hemoglobin greater than 8.  Have discussed this with patient and she agrees with plan.  Will also obtain a GI panel given likely diarrheal illness.  Unable to obtain C. difficile testing given active GI bleed. -GI consulted-EGD:  - Recommend holding Plavix  for 72 hours to allow for further  healing of gastric ulcers and  inflammation. If aspirin  needs to be restarted, can restart tomorrow.             - Increase PPI to 40 mg twice daily.             - Initiate Carafate  1 g 4 times daily x 2 weeks and  then twice daily for 1 month. Then may stop.             - Repeat upper endoscopy in 4 months to check   healing. Can likely pursue at same time as Colonoscopy as she is due this year for  surveillance. Would need to coordinate with primary  GI. -1 unit PRBC    AKI AGMA Myoglobinuria Baseline Cr around 0.9-1. On admission, Cr 2.08. Likely prerenal etiology given GI bleeding, diarrheal illness, and resultant dehydration. She also has myoglobinuria on UA likely from significant dehydration as well. CMP also notable for AGMA likely from significant AKI. -normal renal ultrasound -trend kidney function, monitor UOP -avoid nephrotoxic medications as able    Hypovolemic hyponatremia -trending up     hypokalemia - Repleted   HTN -Resume home meds   Prediabetes with hyperglycemia -last A1c on file from 2020 of 6.1% -not on any medications for this at home -Outpatient follow-up     CAD s/p PCI PVD HLD Hx of MI -resume home lipitor  80mg  daily - Staggered restart of aspirin /Plavix    COPD -chronic and stable, no wheezing on exam, saturating well on RA -resume home bronchodilators   OSA -intolerant to CPAP - Recently started Inspire therapy   Depression Anxiety Bipolar 1 disorder -resume home lexapro , lamictal , quetiapine   GERD -PPI    Obesity Estimated body mass index is 30.65 kg/m as calculated from the following:   Height as of this encounter: 5' 5.5" (1.664 m).   Weight as of this encounter: 84.8 kg.     Medical Consultants:   GI   Discharge Exam:   Vitals:   01/08/24 0746 01/08/24 0747  BP:    Pulse:    Resp:    Temp:    SpO2: 98% 98%   Vitals:   01/07/24 2100 01/08/24 0351 01/08/24 0746 01/08/24 0747  BP: (!) 151/70 (!) 153/84    Pulse:  74 78    Resp:  17    Temp: 98.9 F (37.2 C) 99.1 F (37.3 C)    TempSrc: Oral Oral    SpO2: 98% 96% 98% 98%  Weight:      Height:        General exam: Appears calm and comfortable.    The results of significant diagnostics from this hospitalization (including imaging, microbiology, ancillary and laboratory) are listed below for reference.     Procedures and Diagnostic Studies:   US  RENAL Result Date: 01/06/2024 CLINICAL DATA:  Acute kidney injury EXAM: RENAL / URINARY TRACT ULTRASOUND COMPLETE COMPARISON:  CT abdomen and pelvis 01/06/2024 FINDINGS: Right Kidney: Renal measurements: 10.3 x 4.4 x 5.8 cm = volume: 130 a mL. Echogenicity within normal limits. There is a simple cyst in the superior pole measuring 1.3 x  1.2 x 1.4 cm. No mass or hydronephrosis visualized. Left Kidney: Renal measurements: 9.8 x 5.2 x 6.2 cm = volume: 162 mL. Echogenicity within normal limits. No mass or hydronephrosis visualized. There are 2 simple cysts in the left kidney. One measures 1.0 x 0.8 x 1.0 cm. The other measures 9 x 7 x 11 mm. Bladder: Appears normal for degree of bladder distention. Other: None. IMPRESSION: 1. No hydronephrosis. 2. Bilateral simple renal cysts. Electronically Signed   By: Tyron Gallon M.D.   On: 01/06/2024 22:16   CT ABDOMEN PELVIS WO CONTRAST Result Date: 01/06/2024 CLINICAL DATA:  Acute abdominal pain. Acute kidney injury. Diarrhea. Back pain. EXAM: CT ABDOMEN AND PELVIS WITHOUT CONTRAST TECHNIQUE: Multidetector CT imaging of the abdomen and pelvis was performed following the standard protocol without IV contrast. RADIATION DOSE REDUCTION: This exam was performed according to the departmental dose-optimization program which includes automated exposure control, adjustment of the mA and/or kV according to patient size and/or use of iterative reconstruction technique. COMPARISON:  CT 07/06/2023, levin 924 FINDINGS: Lower chest: No pleural effusion or basilar airspace disease. Coronary  artery calcifications/stents. Hepatobiliary: Cholecystectomy. Previous fluid collection in the cholecystectomy bed has resolved. No evidence of focal liver abnormality or liver lesion on this unenhanced exam. There is no biliary dilatation. Pancreas: No ductal dilatation or inflammation. Spleen: Normal in size without focal abnormality. Adrenals/Urinary Tract: No adrenal nodule. No hydronephrosis. No renal calculi. There is mild left renal atrophy. The small renal cysts and indeterminate renal lesions on prior are not well evaluated in the absence of IV contrast. No further follow-up imaging is recommended. Unremarkable urinary bladder. Stomach/Bowel: Decompressed stomach. No small bowel obstruction or inflammation. Diminutive normal appendix. Small volume of formed stool in the proximal colon. Left colon is decompressed. There is no pericolonic edema or inflammation. Vascular/Lymphatic: Aortic and branch atherosclerosis. No aortic aneurysm. No suspicious lymphadenopathy in the abdomen or pelvis. Reproductive: Uterus and bilateral adnexa are unremarkable. Other: No ascites or free air.  No abdominal wall hernia. Musculoskeletal: L5-S1 facet hypertrophy. There are no acute or suspicious osseous abnormalities. Stable appearance of fat density lesion in the left abductor magnus where included. IMPRESSION: 1. No acute abnormality or explanation for abdominal pain. 2. Mild left renal atrophy. Renal cysts and indeterminate renal lesions on prior exam are not well evaluated in the absence of IV contrast. Aortic Atherosclerosis (ICD10-I70.0). Electronically Signed   By: Chadwick Colonel M.D.   On: 01/06/2024 18:47     Labs:   Basic Metabolic Panel: Recent Labs  Lab 01/06/24 1741 01/07/24 0448 01/07/24 0449 01/07/24 1555 01/08/24 0635  NA 130*  --  134*  --  136  K 3.1*  --  2.6* 3.4* 3.2*  CL 94*  --  98  --  101  CO2 19*  --  23  --  25  GLUCOSE 152*  --  118*  --  114*  BUN 53*  --  38*  --  19   CREATININE 2.08*  --  1.37*  --  0.96  CALCIUM  8.8*  --  8.4*  --  8.5*  MG  --  1.7  --   --   --    GFR Estimated Creatinine Clearance: 66 mL/min (by C-G formula based on SCr of 0.96 mg/dL). Liver Function Tests: Recent Labs  Lab 01/06/24 1741  AST 29  ALT 19  ALKPHOS 123  BILITOT 0.7  PROT 6.9  ALBUMIN  3.7   Recent Labs  Lab 01/06/24 1741  LIPASE 51   No results for input(s): "AMMONIA" in the last 168 hours. Coagulation profile Recent Labs  Lab 01/07/24 0449  INR 1.1    CBC: Recent Labs  Lab 01/06/24 1741 01/07/24 0449 01/08/24 0635  WBC 19.5* 14.7* 12.5*  HGB 7.7* 8.9* 8.5*  HCT 22.9* 27.1* 25.3*  MCV 93.9 94.8 94.1  PLT 456* 379 385   Cardiac Enzymes: No results for input(s): "CKTOTAL", "CKMB", "CKMBINDEX", "TROPONINI" in the last 168 hours. BNP: Invalid input(s): "POCBNP" CBG: Recent Labs  Lab 01/07/24 0803 01/07/24 1123 01/07/24 1636 01/07/24 2118 01/08/24 0740  GLUCAP 114* 145* 179* 146* 122*   D-Dimer No results for input(s): "DDIMER" in the last 72 hours. Hgb A1c Recent Labs    01/07/24 0449  HGBA1C 5.0   Lipid Profile No results for input(s): "CHOL", "HDL", "LDLCALC", "TRIG", "CHOLHDL", "LDLDIRECT" in the last 72 hours. Thyroid  function studies No results for input(s): "TSH", "T4TOTAL", "T3FREE", "THYROIDAB" in the last 72 hours.  Invalid input(s): "FREET3" Anemia work up No results for input(s): "VITAMINB12", "FOLATE", "FERRITIN", "TIBC", "IRON", "RETICCTPCT" in the last 72 hours. Microbiology Recent Results (from the past 240 hours)  Gastrointestinal Panel by PCR , Stool     Status: Abnormal   Collection Time: 01/06/24 11:15 AM   Specimen: Stool  Result Value Ref Range Status   Campylobacter species DETECTED (A) NOT DETECTED Final    Comment: RESULT CALLED TO, READ BACK BY AND VERIFIED WITH: SOPHIA PICKETT 2012 [id number] LRL    Plesimonas shigelloides NOT DETECTED NOT DETECTED Final   Salmonella species NOT DETECTED NOT  DETECTED Final   Yersinia enterocolitica NOT DETECTED NOT DETECTED Final   Vibrio species NOT DETECTED NOT DETECTED Final   Vibrio cholerae NOT DETECTED NOT DETECTED Final   Enteroaggregative E coli (EAEC) NOT DETECTED NOT DETECTED Final   Enteropathogenic E coli (EPEC) NOT DETECTED NOT DETECTED Final   Enterotoxigenic E coli (ETEC) NOT DETECTED NOT DETECTED Final   Shiga like toxin producing E coli (STEC) NOT DETECTED NOT DETECTED Final   Shigella/Enteroinvasive E coli (EIEC) NOT DETECTED NOT DETECTED Final   Cryptosporidium NOT DETECTED NOT DETECTED Final   Cyclospora cayetanensis NOT DETECTED NOT DETECTED Final   Entamoeba histolytica NOT DETECTED NOT DETECTED Final   Giardia lamblia NOT DETECTED NOT DETECTED Final   Adenovirus F40/41 NOT DETECTED NOT DETECTED Final   Astrovirus NOT DETECTED NOT DETECTED Final   Norovirus GI/GII NOT DETECTED NOT DETECTED Final   Rotavirus A NOT DETECTED NOT DETECTED Final   Sapovirus (I, II, IV, and V) NOT DETECTED NOT DETECTED Final    Comment: Performed at [facility], [address], [place], [place] [address]     Discharge Instructions:   Discharge Instructions     Diet - low sodium heart healthy   Complete by: As directed    Discharge instructions   Complete by: As directed    Recommend holding Plavix  for 72 hours to allow  for further healing of gastric ulcers Use enteric coated ASA\ Repeat upper endoscopy in 4 months to check  healing. - protonix  to 40 mg twice daily.  - Carafate  1 g 4 times daily x 2 weeks and then twice daily for 1 month. Then may stop.   Increase activity slowly   Complete by: As directed       Allergies as of 01/08/2024       Reactions   Benadryl  [diphenhydramine  Hcl] Shortness Of Breath, Swelling   Tongue swelling   Codeine Other (  See Comments)   "makes me psychotic"   Haldol [haloperidol Decanoate] Anaphylaxis   Amoxicillin Hives, Swelling   Swelling to extremities Did it involve swelling of  the face/tongue/throat, SOB, or low BP? Yes Did it involve sudden or severe rash/hives, skin peeling, or any reaction on the inside of your mouth or nose? Yes Did you need to seek medical attention at a hospital or doctor's office? Yes When did it last happen?    young adult   If all above answers are "NO", may proceed with cephalosporin use.   Bee Venom Swelling, Other (See Comments)   "pimples" from head to toe and whole body swelling        Medication List     STOP taking these medications    aspirin  81 MG chewable tablet Replaced by: aspirin  EC 81 MG tablet       TAKE these medications    albuterol  108 (90 Base) MCG/ACT inhaler Commonly known as: VENTOLIN  HFA Inhale 2 puffs into the lungs every 6 (six) hours as needed for wheezing or shortness of breath.   amLODipine  5 MG tablet Commonly known as: NORVASC  Take 1 tablet (5 mg total) by mouth daily.   aspirin  EC 81 MG tablet Take 1 tablet (81 mg total) by mouth daily. Swallow whole. Replaces: aspirin  81 MG chewable tablet   atorvastatin  80 MG tablet Commonly known as: LIPITOR  Take 1 tablet (80 mg total) by mouth daily.   Breztri  Aerosphere 160-9-4.8 MCG/ACT Aero inhaler Generic drug: budesonide -glycopyrrolate -formoterol  Inhale 2 puffs into the lungs in the morning and at bedtime.   brimonidine  0.15 % ophthalmic solution Commonly known as: ALPHAGAN  Place 1 drop into both eyes 2 (two) times daily.   clopidogrel  75 MG tablet Commonly known as: Plavix  Take 1 tablet (75 mg total) by mouth daily. Start taking on: Jan 11, 2024   escitalopram  20 MG tablet Commonly known as: LEXAPRO  Take 20 mg by mouth daily.   fluticasone  50 MCG/ACT nasal spray Commonly known as: FLONASE  Place 2 sprays into both nostrils daily.   isosorbide  mononitrate 30 MG 24 hr tablet Commonly known as: IMDUR  TAKE 3 TABLETS BY MOUTH DAILY.   lamoTRIgine  100 MG tablet Commonly known as: LAMICTAL  Take 100 mg by mouth daily.   Linzess   145 MCG Caps capsule Generic drug: linaclotide  Take 145 mcg by mouth every morning.   metoprolol  tartrate 25 MG tablet Commonly known as: LOPRESSOR  TAKE 1/2 TABLET BY MOUTH TWICE A DAY   niacin  1000 MG CR tablet Commonly known as: NIASPAN  TAKE 1 TABLET (1,000 MG TOTAL) BY MOUTH AT BEDTIME.   nitroGLYCERIN  0.4 MG SL tablet Commonly known as: NITROSTAT  Place 1 tablet (0.4 mg total) under the tongue every 5 (five) minutes as needed for chest pain.   ondansetron  4 MG disintegrating tablet Commonly known as: ZOFRAN -ODT Take 1 tablet (4 mg total) by mouth every 8 (eight) hours as needed for nausea or vomiting.   pantoprazole  40 MG tablet Commonly known as: PROTONIX  Take 1 tablet (40 mg total) by mouth 2 (two) times daily. What changed: when to take this   Praluent  150 MG/ML Soaj Generic drug: Alirocumab  Inject 1 mL (150 mg total) into the skin every 14 (fourteen) days.   QUEtiapine  Fumarate 150 MG Tabs Take 150 mg by mouth at bedtime.   ranolazine  500 MG 12 hr tablet Commonly known as: RANEXA  TAKE 1 TABLET BY MOUTH TWICE A DAY   sucralfate  1 g tablet Commonly known as: CARAFATE  Carafate  1 g 4  times daily x 2 weeks and  then twice daily for 1 month. Then may stop.   Vascepa  1 g capsule Generic drug: icosapent  Ethyl TAKE 2 CAPSULES BY MOUTH 2 TIMES DAILY.   VITAMIN C  PO Take 1 tablet by mouth daily.   VITAMIN E PO Take 1 tablet by mouth daily.        Follow-up Information     Claudell Cruz, MD Follow up in 1 week(s).   Specialty: Family Medicine Contact information: [address] [place] [address] [phone]         Avanell Leigh, MD. Schedule an appointment as soon as possible for a visit in 1 month(s).   Specialties: Cardiology, Radiology Why: to discuss ASA/plavix  needs going forward in light of gastric ulcers Contact information: [address] [place] [address] [phone]                  Time  coordinating discharge: 45 min  Signed:  Enrigue Harvard DO  [facility] 01/08/2024, 10:08 AM

## 2024-01-08 NOTE — Plan of Care (Signed)
  Problem: Education: Goal: Ability to describe self-care measures that may prevent or decrease complications (Diabetes Survival Skills Education) will improve Outcome: Progressing Goal: Individualized Educational Video(s) Outcome: Progressing   Problem: Coping: Goal: Ability to adjust to condition or change in health will improve Outcome: Progressing   Problem: Fluid Volume: Goal: Ability to maintain a balanced intake and output will improve Outcome: Progressing   Problem: Education: Goal: Ability to describe self-care measures that may prevent or decrease complications (Diabetes Survival Skills Education) will improve Outcome: Progressing Goal: Individualized Educational Video(s) Outcome: Progressing   Problem: Coping: Goal: Ability to adjust to condition or change in health will improve Outcome: Progressing   Problem: Fluid Volume: Goal: Ability to maintain a balanced intake and output will improve Outcome: Progressing

## 2024-01-09 ENCOUNTER — Encounter (HOSPITAL_COMMUNITY): Payer: Self-pay | Admitting: Gastroenterology

## 2024-01-10 ENCOUNTER — Telehealth: Payer: Self-pay | Admitting: Cardiovascular Disease

## 2024-01-10 ENCOUNTER — Ambulatory Visit: Payer: Self-pay | Admitting: Gastroenterology

## 2024-01-10 NOTE — Telephone Encounter (Signed)
 Pt c/o medication issue:  1. Name of Medication:   ranolazine  (RANEXA ) 500 MG 12 hr tablet   2. How are you currently taking this medication (dosage and times per day)?   As prescribed  3. Are you having a reaction (difficulty breathing--STAT)?   4. What is your medication issue?   Patient stated this medication is not working for her and wants advice on next steps. Patient noted she was recently in hospital and had a blood transfusion.

## 2024-01-10 NOTE — Telephone Encounter (Signed)
 Patient identification verified by 2 forms. Sims Duck, RN     Called and spoke to patient  Patient states:  - wants recommendation on another medication other than ranexa .  - hospitalized on 5/16 with sever stomach pain and black stools - diagnosed with ulcers - received blood transfusion at the hospital  - experiencing chest heaviness with trying to complete simple task.  - having difficulty sleeping due to "chest heaviness" that last 30-40 minutes now. - Increased fatigue. ' - understands how to utilize her nitroglycerin . She has used it a few times over the last week but never used more than 1 pill each time.   Patient denies:   - CP, SOB at time of call. She states SOB and some angina are her baseline. Not experiencing new symptoms of this.   - No vision changes, difficulty with speech or one-sided weakness.   Interventions/Plan: - Encounter forwarded to primary cardiologist for review/recommendations.    Reviewed ED warning signs/precautions  Patient agrees with plan, no questions at this time

## 2024-01-15 ENCOUNTER — Other Ambulatory Visit: Payer: Self-pay

## 2024-01-15 ENCOUNTER — Encounter (HOSPITAL_BASED_OUTPATIENT_CLINIC_OR_DEPARTMENT_OTHER): Payer: Self-pay

## 2024-01-15 ENCOUNTER — Emergency Department (HOSPITAL_BASED_OUTPATIENT_CLINIC_OR_DEPARTMENT_OTHER): Admitting: Radiology

## 2024-01-15 ENCOUNTER — Emergency Department (HOSPITAL_BASED_OUTPATIENT_CLINIC_OR_DEPARTMENT_OTHER)
Admission: EM | Admit: 2024-01-15 | Discharge: 2024-01-15 | Disposition: A | Discharge status: Home / Self Care | Attending: Emergency Medicine | Admitting: Emergency Medicine

## 2024-01-15 ENCOUNTER — Emergency Department (HOSPITAL_BASED_OUTPATIENT_CLINIC_OR_DEPARTMENT_OTHER)

## 2024-01-15 DIAGNOSIS — R0602 Shortness of breath: Secondary | ICD-10-CM | POA: Diagnosis present

## 2024-01-15 DIAGNOSIS — Z7901 Long term (current) use of anticoagulants: Secondary | ICD-10-CM | POA: Diagnosis not present

## 2024-01-15 DIAGNOSIS — I509 Heart failure, unspecified: Secondary | ICD-10-CM | POA: Diagnosis not present

## 2024-01-15 DIAGNOSIS — I110 Hypertensive heart disease with heart failure: Secondary | ICD-10-CM | POA: Diagnosis not present

## 2024-01-15 DIAGNOSIS — Z79899 Other long term (current) drug therapy: Secondary | ICD-10-CM | POA: Diagnosis not present

## 2024-01-15 DIAGNOSIS — D72829 Elevated white blood cell count, unspecified: Secondary | ICD-10-CM | POA: Diagnosis not present

## 2024-01-15 DIAGNOSIS — Z951 Presence of aortocoronary bypass graft: Secondary | ICD-10-CM | POA: Diagnosis not present

## 2024-01-15 DIAGNOSIS — I2510 Atherosclerotic heart disease of native coronary artery without angina pectoris: Secondary | ICD-10-CM | POA: Insufficient documentation

## 2024-01-15 DIAGNOSIS — Z7982 Long term (current) use of aspirin: Secondary | ICD-10-CM | POA: Insufficient documentation

## 2024-01-15 DIAGNOSIS — J449 Chronic obstructive pulmonary disease, unspecified: Secondary | ICD-10-CM | POA: Diagnosis not present

## 2024-01-15 LAB — CBC
HCT: 24.9 % — ABNORMAL LOW (ref 36.0–46.0)
Hemoglobin: 7.9 g/dL — ABNORMAL LOW (ref 12.0–15.0)
MCH: 29 pg (ref 26.0–34.0)
MCHC: 31.7 g/dL (ref 30.0–36.0)
MCV: 91.5 fL (ref 80.0–100.0)
Platelets: 486 10*3/uL — ABNORMAL HIGH (ref 150–400)
RBC: 2.72 MIL/uL — ABNORMAL LOW (ref 3.87–5.11)
RDW: 15.5 % (ref 11.5–15.5)
WBC: 10.9 10*3/uL — ABNORMAL HIGH (ref 4.0–10.5)
nRBC: 0.3 % — ABNORMAL HIGH (ref 0.0–0.2)

## 2024-01-15 LAB — COMPREHENSIVE METABOLIC PANEL WITH GFR
ALT: 10 U/L (ref 0–44)
AST: 21 U/L (ref 15–41)
Albumin: 3.7 g/dL (ref 3.5–5.0)
Alkaline Phosphatase: 139 U/L — ABNORMAL HIGH (ref 38–126)
Anion gap: 13 (ref 5–15)
BUN: 16 mg/dL (ref 8–23)
CO2: 23 mmol/L (ref 22–32)
Calcium: 9 mg/dL (ref 8.9–10.3)
Chloride: 103 mmol/L (ref 98–111)
Creatinine, Ser: 1.3 mg/dL — ABNORMAL HIGH (ref 0.44–1.00)
GFR, Estimated: 46 mL/min — ABNORMAL LOW (ref >60)
Glucose, Bld: 110 mg/dL — ABNORMAL HIGH (ref 70–99)
Potassium: 3.6 mmol/L (ref 3.5–5.1)
Sodium: 139 mmol/L (ref 135–145)
Total Bilirubin: 0.3 mg/dL (ref 0.0–1.2)
Total Protein: 6.4 g/dL — ABNORMAL LOW (ref 6.5–8.1)

## 2024-01-15 LAB — RETICULOCYTES
Immature Retic Fract: 19.9 % — ABNORMAL HIGH (ref 2.3–15.9)
RBC.: 2.6 MIL/uL — ABNORMAL LOW (ref 3.87–5.11)
Retic Count, Absolute: 76.5 10*3/uL (ref 19.0–186.0)
Retic Ct Pct: 2.9 % (ref 0.4–3.1)

## 2024-01-15 LAB — TROPONIN T, HIGH SENSITIVITY
Troponin T High Sensitivity: 26 ng/L — ABNORMAL HIGH (ref <19)
Troponin T High Sensitivity: 26 ng/L — ABNORMAL HIGH (ref <19)

## 2024-01-15 LAB — IRON AND TIBC
Iron: 15 ug/dL — ABNORMAL LOW (ref 28–170)
Saturation Ratios: 3 % — ABNORMAL LOW (ref 10.4–31.8)
TIBC: 454 ug/dL — ABNORMAL HIGH (ref 250–450)
UIBC: 439 ug/dL

## 2024-01-15 LAB — LIPASE, BLOOD: Lipase: 40 U/L (ref 11–51)

## 2024-01-15 LAB — PRO BRAIN NATRIURETIC PEPTIDE: Pro Brain Natriuretic Peptide: 790 pg/mL — ABNORMAL HIGH (ref <300.0)

## 2024-01-15 LAB — D-DIMER, QUANTITATIVE: D-Dimer, Quant: 0.77 ug{FEU}/mL — ABNORMAL HIGH (ref 0.00–0.50)

## 2024-01-15 LAB — OCCULT BLOOD X 1 CARD TO LAB, STOOL: Fecal Occult Bld: NEGATIVE

## 2024-01-15 LAB — VITAMIN B12: Vitamin B-12: 652 pg/mL (ref 180–914)

## 2024-01-15 MED ORDER — IOHEXOL 350 MG/ML SOLN
100.0000 mL | Freq: Once | INTRAVENOUS | Status: AC | PRN
Start: 2024-01-15 — End: 2024-01-15
  Administered 2024-01-15: 100 mL via INTRAVENOUS

## 2024-01-15 MED ORDER — PREDNISONE 10 MG PO TABS: 20.0000 mg | ORAL_TABLET | Freq: Every day | ORAL | 0 refills | Status: AC

## 2024-01-15 NOTE — ED Notes (Signed)
 RN reviewed discharge instructions with pt. Pt verbalized understanding and had no further questions. VSS upon discharge.

## 2024-01-15 NOTE — ED Provider Notes (Signed)
 [place] EMERGENCY DEPARTMENT AT [facility] Provider Note   CSN: [id number] Arrival date & time: 01/15/24  1249     History {Add pertinent medical, surgical, social history, OB history to HPI:1} Chief Complaint  Patient presents with   Shortness of Breath    [name] is a 63 y.o. female.   Shortness of Breath   63 year old female presents emergency department with complaints of shortness of breath.  States that she feels short of breath with exertion but whenever she sits still, she does not feel short of breath.  Also has some chest discomfort.  States that she has had chest pain with walking for the past 20+ years.  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  Was recently admitted for acute lower GI bleed, AKI diarrhea, abdominal pain 01/06/2024; states she was given 1 unit of blood at that time with improvement of her shortness of breath.  States that a couple days after discharge, seem to return.  Reports continued symptoms ever since then.  As she lays in bed, currently denying any symptoms.  Denies any fevers, chills, cough, abdominal pain, nausea, vomiting, urinary symptoms, change in bowel habits.  Patient does state that she has been using an inhaler at home which has been helping some with her symptoms.  Past medical history significant for CHF with left ventricular ejection fraction 50 to 55%, COPD, CAD with CABG, hypertension, lipidemia, OSA, MI, PVD, schizophrenia,, gastritis/gastroduodenitis, gastric ulcers  Home Medications Prior to Admission medications   Medication Sig Start Date End Date Taking? Authorizing Provider  albuterol  (PROVENTIL  HFA;VENTOLIN  HFA) 108 (90 BASE) MCG/ACT inhaler Inhale 2 puffs into the lungs every 6 (six) hours as needed for wheezing or shortness of breath.     [provider]  Alirocumab  (PRALUENT ) 150  MG/ML SOAJ Inject 1 mL (150 mg total) into the skin every 14 (fourteen) days. 07/10/23   Tania Familia, NP  amLODipine  (NORVASC ) 5 MG tablet Take 1 tablet (5 mg total) by mouth daily. 06/19/22   Avanell Leigh, MD  Ascorbic Acid  (VITAMIN C  PO) Take 1 tablet by mouth daily.    [provider]  aspirin  EC 81 MG tablet Take 1 tablet (81 mg total) by mouth daily. Swallow whole. 01/08/24 01/07/25  Enrigue Harvard, DO  atorvastatin  (LIPITOR ) 80 MG tablet Take 1 tablet (80 mg total) by mouth daily. 08/11/22   Avanell Leigh, MD  brimonidine  (ALPHAGAN ) 0.15 % ophthalmic solution Place 1 drop into both eyes 2 (two) times daily.     [provider]  Budeson-Glycopyrrol-Formoterol  (BREZTRI  AEROSPHERE) 160-9-4.8 MCG/ACT AERO Inhale 2 puffs into the lungs in the morning and at bedtime. 04/30/23   Cobb, Mariah Shines, NP  clopidogrel  (PLAVIX ) 75 MG tablet Take 1 tablet (75 mg total) by mouth daily. 01/11/24 01/10/25  Enrigue Harvard, DO  escitalopram  (LEXAPRO ) 20 MG tablet Take 20 mg by mouth daily. 05/31/19   [provider]  fluticasone  (FLONASE ) 50 MCG/ACT nasal spray Place 2 sprays into both nostrils daily. Patient taking differently: Place 2 sprays into both nostrils daily as needed for allergies. 05/30/23   Soldatova, Liuba, MD  icosapent  Ethyl (VASCEPA ) 1 g capsule TAKE 2 CAPSULES BY MOUTH 2 TIMES DAILY. 09/03/23   Avanell Leigh, MD  isosorbide  mononitrate (IMDUR ) 30 MG 24 hr tablet TAKE 3 TABLETS BY MOUTH DAILY. 02/21/23  Avanell Leigh, MD  lamoTRIgine  (LAMICTAL ) 100 MG tablet Take 100 mg by mouth daily. 08/05/19   [provider]  LINZESS  145 MCG CAPS capsule Take 145 mcg by mouth every morning. Patient not taking: Reported on 01/06/2024 11/10/23   [provider]  metoprolol  tartrate (LOPRESSOR ) 25 MG tablet TAKE 1/2 TABLET BY MOUTH TWICE A DAY 10/20/22   Avanell Leigh, MD  niacin  (NIASPAN ) 1000 MG CR tablet TAKE 1 TABLET (1,000 MG TOTAL) BY MOUTH AT  BEDTIME. 02/21/23   Avanell Leigh, MD  nitroGLYCERIN  (NITROSTAT ) 0.4 MG SL tablet Place 1 tablet (0.4 mg total) under the tongue every 5 (five) minutes as needed for chest pain. 07/04/23   Avanell Leigh, MD  ondansetron  (ZOFRAN -ODT) 4 MG disintegrating tablet Take 1 tablet (4 mg total) by mouth every 8 (eight) hours as needed for nausea or vomiting. 07/07/23   Clark, Meghan R, PA-C  pantoprazole  (PROTONIX ) 40 MG tablet Take 1 tablet (40 mg total) by mouth 2 (two) times daily. 01/08/24   Vann, Jessica U, DO  QUEtiapine  Fumarate 150 MG TABS Take 150 mg by mouth at bedtime.    [provider]  ranolazine  (RANEXA ) 500 MG 12 hr tablet TAKE 1 TABLET BY MOUTH TWICE A DAY Patient taking differently: Take 1,000 mg by mouth 2 (two) times daily. 1 gram twice daily 09/15/22   Avanell Leigh, MD  sucralfate  (CARAFATE ) 1 g tablet Carafate  1 g 4 times daily x 2 weeks and  then twice daily for 1 month. Then may stop. 01/08/24   Vann, Jessica U, DO  VITAMIN E PO Take 1 tablet by mouth daily.    [provider]      Allergies    Benadryl  [diphenhydramine  hcl], Codeine, Haldol [haloperidol decanoate], Amoxicillin, and Bee venom    Review of Systems   Review of Systems  Respiratory:  Positive for shortness of breath.   All other systems reviewed and are negative.   Physical Exam Updated Vital Signs BP 121/86 (BP Location: Right Arm)   Pulse 71   Temp 98.4 F (36.9 C) (Oral)   Resp 16   LMP 11/16/2011   SpO2 100%  Physical Exam Vitals and nursing note reviewed.  Constitutional:      General: She is not in acute distress.    Appearance: She is well-developed.  HENT:     Head: Normocephalic and atraumatic.  Eyes:     Conjunctiva/sclera: Conjunctivae normal.  Cardiovascular:     Rate and Rhythm: Normal rate and regular rhythm.     Pulses: Normal pulses.  Pulmonary:     Effort: Pulmonary effort is normal. No respiratory distress.     Breath sounds: No rhonchi or rales.      Comments: Faint with wheezing. Abdominal:     Palpations: Abdomen is soft.     Tenderness: There is no abdominal tenderness.  Musculoskeletal:        General: No swelling.     Cervical back: Neck supple.     Right lower leg: No edema.     Left lower leg: No edema.  Skin:    General: Skin is warm and dry.     Capillary Refill: Capillary refill takes less than 2 seconds.  Neurological:     Mental Status: She is alert.  Psychiatric:        Mood and Affect: Mood normal.     ED Results / Procedures / Treatments   Labs (all labs ordered are  listed, but only abnormal results are displayed) Labs Reviewed  COMPREHENSIVE METABOLIC PANEL WITH GFR  CBC  LIPASE, BLOOD  TROPONIN T, HIGH SENSITIVITY    EKG None  Radiology No results found.  Procedures Procedures  {Document cardiac monitor, telemetry assessment procedure when appropriate:1}  Medications Ordered in ED Medications - No data to display  ED Course/ Medical Decision Making/ A&P   {   Click here for ABCD2, HEART and other calculatorsREFRESH Note before signing :1}                              Medical Decision Making Amount and/or Complexity of Data Reviewed Labs: ordered. Radiology: ordered.  Risk Prescription drug management.   This patient presents to the ED for concern of shortness of breath, this involves an extensive number of treatment options, and is a complaint that carries with it a high risk of complications and morbidity.  The differential diagnosis includes ACS, PE, pneumothorax, pneumonia, anemia, CHF, COPD, asthma, viral URI, other   Co morbidities that complicate the patient evaluation  See HPI   Additional history obtained:  Additional history obtained from EMR External records from outside source obtained and reviewed including hospital records   Lab Tests:  I Ordered, and personally interpreted labs.  The pertinent results include: Leukocytosis of 10.9.  Anemia with a hemoglobin of  7.9.  Thrombocytosis of 486. No electrolyte abnormalities.  Patient with baseline renal function creatinine 1.3, BUN of 16, GFR 46.  Initial troponin 26 with repeat 26.  D-dimer elevated at 0.77.  Anemia panel pending.   Imaging Studies ordered:  I ordered imaging studies including chest x-ray, CT angio chest PE I independently visualized and interpreted imaging which showed  Chest x-ray: No acute cardiopulmonary abnormality CT angio chest PE: No PE or other acute abnormality. I agree with the radiologist interpretation   Cardiac Monitoring: / EKG:  The patient was maintained on a cardiac monitor.  I personally viewed and interpreted the cardiac monitored which showed an underlying rhythm of: Sinus rhythm.  LVH with IVCD.  Inferior infarct.  Very similar appearing EKG from 01/06/2024  Consultations Obtained:  I requested consultation with attending   Problem List / ED Course / Critical interventions / Medication management  Shortness of breath Reevaluation of the patient showed that the patient stayed the same I have reviewed the patients home medicines and have made adjustments as needed   Social Determinants of Health:  Cigarette use.  Denies illicit drug use.   Test / Admission - Considered:  Shortness of breath Vitals signs within normal range and stable throughout visit. Laboratory/imaging studies significant for: see above 63 year old female presents emergency department with complaints of shortness of breath.  States that she feels short of breath with exertion but whenever she sits still, she does not feel short of breath.  Also has some chest discomfort.  States that she has had chest pain with walking for the past 20+ years.  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  Was recently admitted for acute lower GI bleed, AKI diarrhea, abdominal pain 01/06/2024;  states she was given 1 unit of blood at that time with improvement of her shortness of breath.  States that a couple days after discharge, seem to return.  Reports continued symptoms ever since then.  As she  lays in bed, currently denying any symptoms.  Denies any fevers, chills, cough, abdominal pain, nausea, vomiting, urinary symptoms, change in bowel habits.  Patient does state that she has been using an inhaler at home which has been helping some with her symptoms. On exam, faint wheezing auscultated bilateral lung fields.  No lower extremity edema.  No abdominal tenderness.  Workup today reassuring.  Patient with flat troponins, no acute ischemic change on EKG; low suspicion for ACS.  Patient with elevated D-dimer with subsequent CT angio chest PE without evidence of PE, pneumothorax, pneumonia or other acute cardiopulmonary abnormality.  Normal BNP without evidence of volume overload status; low suspicion for CHF.  Patient is with anemia hemoglobin 7.9 but upon discharge after receiving unit of blood, hemoglobin was 8.5; no acute change.  No evidence of acute GI bleed today with negative occult and no reported coffee-ground emesis, hemoptysis; will obtain anemia panel and recommend follow-up with PCP regarding anemia.  Patient has noted improvement with breathing treatments administered in the outpatient setting with some faint wheezing on exam.  Could be secondary to COPD exacerbation.  Will trial short course of corticosteroid, recommend adherence with at home breathing therapies and follow-up with PCP.  Given patient's extensive heart history, will also recommend follow-up with cardiology as she already has an appointment scheduled within a week and a half or so.  Treatment plan discussed with patient and she acknowledged understanding was agreeable to said plan.  Patient overall well-appearing, afebrile in no acute distress. Worrisome signs and symptoms were discussed with the patient, and the patient  acknowledged understanding to return to the ED if noticed. Patient was stable upon discharge.    {Document critical care time when appropriate:1} {Document review of labs and clinical decision tools ie heart score, Chads2Vasc2 etc:1}  {Document your independent review of radiology images, and any outside records:1} {Document your discussion with family members, caretakers, and with consultants:1} {Document social determinants of health affecting pt's care:1} {Document your decision making why or why not admission, treatments were needed:1} Final Clinical Impression(s) / ED Diagnoses Final diagnoses:  None    Rx / DC Orders ED Discharge Orders     None

## 2024-01-15 NOTE — ED Triage Notes (Signed)
 In for eval of SOB with exertion, chest pains with exertion, headache, and tongue swollen.  Discharged 01/10/2024 s/p acute anemia and gastric ulcers. Denies blood in stool at this time. Taking Carafate  and has GI follow up appointments scheduled.

## 2024-01-15 NOTE — Discharge Instructions (Signed)
 As discussed, your workup today was overall reassuring.  Your heart enzymes were normal and EKG appeared unchanged; does not appear like you have a heart attack.  CT scan did not show obvious pneumonia, collapsed lung, blood clot in your lung.  Does not appear to be of a heart failure exacerbation either.  Given your improvement of symptoms with the breathing treatment at home, we will try prednisone  for a few days to see if this is related to COPD exacerbation.  Regarding her anemia, we have sent off a anemia panel and will recommend follow-up with your PCP in the outpatient setting for reassessment.  Recommend follow-up with cardiology for reassessment of your shortness of breath as well given your significant cardiac history.  Please do not hesitate to return to the emergency department if the worrisome signs and symptoms we discussed become apparent.

## 2024-01-18 NOTE — Telephone Encounter (Signed)
 Patient identification verified by 2 forms. Sims Duck, RN     Called patient. No answer. Left detailed vm with provider response/recommendations.   Encounter forwarded to primary nurse for any further followup.

## 2024-01-20 ENCOUNTER — Emergency Department (HOSPITAL_COMMUNITY)

## 2024-01-20 ENCOUNTER — Emergency Department (HOSPITAL_COMMUNITY)
Admission: EM | Admit: 2024-01-20 | Discharge: 2024-01-21 | Disposition: A | Discharge status: Home / Self Care | Attending: Emergency Medicine | Admitting: Emergency Medicine

## 2024-01-20 ENCOUNTER — Other Ambulatory Visit: Payer: Self-pay

## 2024-01-20 DIAGNOSIS — Z79899 Other long term (current) drug therapy: Secondary | ICD-10-CM | POA: Insufficient documentation

## 2024-01-20 DIAGNOSIS — Z951 Presence of aortocoronary bypass graft: Secondary | ICD-10-CM | POA: Diagnosis not present

## 2024-01-20 DIAGNOSIS — R079 Chest pain, unspecified: Secondary | ICD-10-CM | POA: Diagnosis present

## 2024-01-20 DIAGNOSIS — Z7982 Long term (current) use of aspirin: Secondary | ICD-10-CM | POA: Insufficient documentation

## 2024-01-20 DIAGNOSIS — I10 Essential (primary) hypertension: Secondary | ICD-10-CM | POA: Insufficient documentation

## 2024-01-20 DIAGNOSIS — Z7902 Long term (current) use of antithrombotics/antiplatelets: Secondary | ICD-10-CM | POA: Diagnosis not present

## 2024-01-20 DIAGNOSIS — D649 Anemia, unspecified: Secondary | ICD-10-CM | POA: Diagnosis not present

## 2024-01-20 DIAGNOSIS — R109 Unspecified abdominal pain: Secondary | ICD-10-CM | POA: Diagnosis not present

## 2024-01-20 LAB — CBC
HCT: 25.8 % — ABNORMAL LOW (ref 36.0–46.0)
Hemoglobin: 7.9 g/dL — ABNORMAL LOW (ref 12.0–15.0)
MCH: 28.2 pg (ref 26.0–34.0)
MCHC: 30.6 g/dL (ref 30.0–36.0)
MCV: 92.1 fL (ref 80.0–100.0)
Platelets: 434 10*3/uL — ABNORMAL HIGH (ref 150–400)
RBC: 2.8 MIL/uL — ABNORMAL LOW (ref 3.87–5.11)
RDW: 16.2 % — ABNORMAL HIGH (ref 11.5–15.5)
WBC: 13.3 10*3/uL — ABNORMAL HIGH (ref 4.0–10.5)
nRBC: 0.2 % (ref 0.0–0.2)

## 2024-01-20 LAB — COMPREHENSIVE METABOLIC PANEL WITH GFR
ALT: 11 U/L (ref 0–44)
AST: 24 U/L (ref 15–41)
Albumin: 3.2 g/dL — ABNORMAL LOW (ref 3.5–5.0)
Alkaline Phosphatase: 146 U/L — ABNORMAL HIGH (ref 38–126)
Anion gap: 10 (ref 5–15)
BUN: 10 mg/dL (ref 8–23)
CO2: 22 mmol/L (ref 22–32)
Calcium: 8.8 mg/dL — ABNORMAL LOW (ref 8.9–10.3)
Chloride: 105 mmol/L (ref 98–111)
Creatinine, Ser: 1.23 mg/dL — ABNORMAL HIGH (ref 0.44–1.00)
GFR, Estimated: 50 mL/min — ABNORMAL LOW (ref >60)
Glucose, Bld: 160 mg/dL — ABNORMAL HIGH (ref 70–99)
Potassium: 4.1 mmol/L (ref 3.5–5.1)
Sodium: 137 mmol/L (ref 135–145)
Total Bilirubin: 0.7 mg/dL (ref 0.0–1.2)
Total Protein: 6.6 g/dL (ref 6.5–8.1)

## 2024-01-20 LAB — PREPARE RBC (CROSSMATCH)

## 2024-01-20 LAB — LIPASE, BLOOD: Lipase: 35 U/L (ref 11–51)

## 2024-01-20 LAB — TROPONIN I (HIGH SENSITIVITY)
Troponin I (High Sensitivity): 12 ng/L (ref <18)
Troponin I (High Sensitivity): 12 ng/L (ref <18)

## 2024-01-20 LAB — ETHANOL: Alcohol, Ethyl (B): <15 mg/dL (ref <15)

## 2024-01-20 MED ORDER — SODIUM CHLORIDE 0.9% IV SOLUTION: Freq: Once | INTRAVENOUS | Status: DC

## 2024-01-20 MED ORDER — IOHEXOL 350 MG/ML SOLN
65.0000 mL | Freq: Once | INTRAVENOUS | Status: AC | PRN
  Administered 2024-01-20: 65 mL via INTRAVENOUS

## 2024-01-20 MED ORDER — FENTANYL CITRATE PF 50 MCG/ML IJ SOSY
50.0000 ug | PREFILLED_SYRINGE | Freq: Once | INTRAMUSCULAR | Status: AC
  Administered 2024-01-20: 50 ug via INTRAVENOUS
  Filled 2024-01-20: qty 1

## 2024-01-20 MED ORDER — ONDANSETRON 4 MG PO TBDP
4.0000 mg | ORAL_TABLET | Freq: Three times a day (TID) | ORAL | 0 refills | Status: AC | PRN
Start: 2024-01-20 — End: ?

## 2024-01-20 MED ORDER — METOCLOPRAMIDE HCL 5 MG/ML IJ SOLN
10.0000 mg | Freq: Once | INTRAMUSCULAR | Status: AC
  Administered 2024-01-20: 10 mg via INTRAVENOUS
  Filled 2024-01-20: qty 2

## 2024-01-20 MED ORDER — ASPIRIN 81 MG PO CHEW
324.0000 mg | CHEWABLE_TABLET | Freq: Once | ORAL | Status: AC
  Administered 2024-01-20: 324 mg via ORAL
  Filled 2024-01-20: qty 4

## 2024-01-20 MED ORDER — SUCRALFATE 1 GM/10ML PO SUSP
1.0000 g | Freq: Once | ORAL | Status: AC
  Administered 2024-01-20: 1 g via ORAL
  Filled 2024-01-20: qty 10

## 2024-01-20 MED ORDER — FAMOTIDINE IN NACL 20-0.9 MG/50ML-% IV SOLN
20.0000 mg | Freq: Once | INTRAVENOUS | Status: AC
  Administered 2024-01-20: 20 mg via INTRAVENOUS
  Filled 2024-01-20: qty 50

## 2024-01-20 MED ORDER — ONDANSETRON HCL 4 MG/2ML IJ SOLN
4.0000 mg | Freq: Once | INTRAMUSCULAR | Status: AC
  Administered 2024-01-20: 4 mg via INTRAVENOUS
  Filled 2024-01-20: qty 2

## 2024-01-20 NOTE — ED Notes (Signed)
 Awaiting blood bank call for blood to be ready

## 2024-01-20 NOTE — ED Notes (Signed)
 Patient transported to CT

## 2024-01-20 NOTE — ED Notes (Signed)
Consent for blood transfusion signed by patient and witnessed by this RN.

## 2024-01-20 NOTE — Discharge Instructions (Addendum)
 As discussed, with your anemia it is of important to follow-up with your physician tomorrow, as the evaluation for anemia can take time for completion.  Return here for concerning changes in your condition.

## 2024-01-20 NOTE — ED Triage Notes (Signed)
 Patient arrives via [facility] for chest pain that radiates to back. Patient also endorses nausea and vomiting that started this morning, described as acid reflux. Hx of stents and heart surgery- per patient she can not have anymore stents placed. Hx of gastric ulcers. Patient is prescribed nitroglycerin  at home for pain relief. Given two nitroglycerin  en route, first nitroglycerin  with relief, second without relief. Taken three nitroglycerin  prior to EMS arrival.   EMS vitals  BP 200/100, now 160 systolic. HR NSR unremarkable EKG  18 R FA

## 2024-01-20 NOTE — ED Provider Notes (Signed)
 [place] EMERGENCY DEPARTMENT AT [facility] Provider Note   CSN: [id number] Arrival date & time: 01/20/24  1525     History  Chief Complaint  Patient presents with   Chest Pain    [name] is a 63 y.o. female.  HPI   Patient presents via EMS with chest pain.  She notes over the past weeks that she has had ongoing pain, waxing, waning, some improvement with nitroglycerin .  She has been seen and evaluated at least twice in emergency department during this time, is scheduled to follow-up with cardiology in 1 week.  Today with persistent pain, including in the abdomen after nitroglycerin  she called EMS for transport.  No associated nausea, vomiting, and p.o. intolerance of her medications today.  Home Medications Prior to Admission medications   Medication Sig Start Date End Date Taking? Authorizing Provider  ondansetron  (ZOFRAN -ODT) 4 MG disintegrating tablet Take 1 tablet (4 mg total) by mouth every 8 (eight) hours as needed for nausea or vomiting. 01/20/24  Yes Dorenda Gandy, MD  albuterol  (PROVENTIL  HFA;VENTOLIN  HFA) 108 (90 BASE) MCG/ACT inhaler Inhale 2 puffs into the lungs every 6 (six) hours as needed for wheezing or shortness of breath.     [provider]  Alirocumab  (PRALUENT ) 150 MG/ML SOAJ Inject 1 mL (150 mg total) into the skin every 14 (fourteen) days. 07/10/23   Tania Familia, NP  amLODipine  (NORVASC ) 5 MG tablet Take 1 tablet (5 mg total) by mouth daily. 06/19/22   Avanell Leigh, MD  aspirin  EC 81 MG tablet Take 1 tablet (81 mg total) by mouth daily. Swallow whole. 01/08/24 01/07/25  Enrigue Harvard, DO  atorvastatin  (LIPITOR ) 80 MG tablet Take 1 tablet (80 mg total) by mouth daily. 08/11/22   Avanell Leigh, MD  brimonidine  (ALPHAGAN ) 0.15 % ophthalmic solution Place 1 drop into both eyes 2 (two) times daily.     [provider]  Budeson-Glycopyrrol-Formoterol  (BREZTRI  AEROSPHERE) 160-9-4.8 MCG/ACT AERO Inhale 2 puffs  into the lungs in the morning and at bedtime. 04/30/23   Cobb, Mariah Shines, NP  clopidogrel  (PLAVIX ) 75 MG tablet Take 1 tablet (75 mg total) by mouth daily. 01/11/24 01/10/25  Enrigue Harvard, DO  escitalopram  (LEXAPRO ) 20 MG tablet Take 20 mg by mouth daily. 05/31/19   [provider]  fluticasone  (FLONASE ) 50 MCG/ACT nasal spray Place 2 sprays into both nostrils daily. Patient taking differently: Place 2 sprays into both nostrils daily as needed for allergies. 05/30/23   Soldatova, Liuba, MD  icosapent  Ethyl (VASCEPA ) 1 g capsule TAKE 2 CAPSULES BY MOUTH 2 TIMES DAILY. 09/03/23   Avanell Leigh, MD  isosorbide  mononitrate (IMDUR ) 30 MG 24 hr tablet TAKE 3 TABLETS BY MOUTH DAILY. 02/21/23   Avanell Leigh, MD  lamoTRIgine  (LAMICTAL ) 100 MG tablet Take 100 mg by mouth daily. 08/05/19   [provider]  metoprolol  tartrate (LOPRESSOR ) 25 MG tablet TAKE 1/2 TABLET BY MOUTH TWICE A DAY 10/20/22   Avanell Leigh, MD  niacin  (NIASPAN ) 1000 MG CR tablet TAKE 1 TABLET (1,000 MG TOTAL) BY MOUTH AT BEDTIME. 02/21/23   Avanell Leigh, MD  nitroGLYCERIN  (NITROSTAT ) 0.4 MG SL tablet Place 1 tablet (0.4 mg total) under the tongue every 5 (five) minutes as needed for chest pain. 07/04/23   Avanell Leigh, MD  ondansetron  (ZOFRAN -ODT) 4 MG disintegrating tablet Take 1 tablet (4 mg total) by mouth every 8 (eight) hours as needed for nausea or vomiting. 07/07/23  Clark, Meghan R, PA-C  pantoprazole  (PROTONIX ) 40 MG tablet Take 1 tablet (40 mg total) by mouth 2 (two) times daily. 01/08/24   Vann, Jessica U, DO  predniSONE  (DELTASONE ) 10 MG tablet Take 2 tablets (20 mg total) by mouth daily for 5 days. 01/15/24 01/20/24  [place] Butter, PA  QUEtiapine  Fumarate 150 MG TABS Take 150 mg by mouth at bedtime.    [provider]  ranolazine  (RANEXA ) 500 MG 12 hr tablet TAKE 1 TABLET BY MOUTH TWICE A DAY Patient taking differently: Take 1,000 mg by mouth 2 (two) times daily. 1 gram twice daily  09/15/22   Avanell Leigh, MD  sucralfate  (CARAFATE ) 1 g tablet Carafate  1 g 4 times daily x 2 weeks and  then twice daily for 1 month. Then may stop. 01/08/24   Vann, Jessica U, DO  VITAMIN E PO Take 1 tablet by mouth daily.    [provider]      Allergies    Benadryl  [diphenhydramine  hcl], Codeine, Haldol [haloperidol decanoate], Amoxicillin, and Bee venom    Review of Systems   Review of Systems  Physical Exam Updated Vital Signs BP (!) 150/73   Pulse 73   Temp 98.4 F (36.9 C) (Oral)   Resp (!) 21   Ht 5' 5.5" (1.664 m)   Wt 84.8 kg   LMP 11/16/2011   SpO2 100%   BMI 30.65 kg/m  Physical Exam Vitals and nursing note reviewed.  Constitutional:      General: She is not in acute distress.    Appearance: She is well-developed.  HENT:     Head: Normocephalic and atraumatic.  Eyes:     Conjunctiva/sclera: Conjunctivae normal.  Cardiovascular:     Rate and Rhythm: Normal rate and regular rhythm.  Pulmonary:     Effort: Pulmonary effort is normal. No respiratory distress.     Breath sounds: Normal breath sounds. No stridor.  Abdominal:     General: There is no distension.  Skin:    General: Skin is warm and dry.  Neurological:     Mental Status: She is alert and oriented to person, place, and time.     Cranial Nerves: No cranial nerve deficit.  Psychiatric:        Mood and Affect: Mood normal.     ED Results / Procedures / Treatments   Labs (all labs ordered are listed, but only abnormal results are displayed) Labs Reviewed  CBC - Abnormal; Notable for the following components:      Result Value   WBC 13.3 (*)    RBC 2.80 (*)    Hemoglobin 7.9 (*)    HCT 25.8 (*)    RDW 16.2 (*)    Platelets 434 (*)    All other components within normal limits  COMPREHENSIVE METABOLIC PANEL WITH GFR - Abnormal; Notable for the following components:   Glucose, Bld 160 (*)    Creatinine, Ser 1.23 (*)    Calcium  8.8 (*)    Albumin  3.2 (*)    Alkaline  Phosphatase 146 (*)    GFR, Estimated 50 (*)    All other components within normal limits  ETHANOL  LIPASE, BLOOD  TYPE AND SCREEN  PREPARE RBC (CROSSMATCH)  TROPONIN I (HIGH SENSITIVITY)  TROPONIN I (HIGH SENSITIVITY)    EKG EKG Interpretation Date/Time:  Sunday January 20 2024 15:35:02 EDT Ventricular Rate:  67 PR Interval:  195 QRS Duration:  126 QT Interval:  429 QTC Calculation: 453 R  Axis:   38  Text Interpretation: Sinus rhythm LVH with IVCD and secondary repol abnrm Anterolateral infarct, age indeterminate No significant change since last tracing Confirmed by Dorenda Gandy [phone]) on 01/20/2024 3:46:35 PM  Radiology CT ABDOMEN PELVIS W CONTRAST Result Date: 01/20/2024 CLINICAL DATA:  Acute abdominal pain. EXAM: CT ABDOMEN AND PELVIS WITH CONTRAST TECHNIQUE: Multidetector CT imaging of the abdomen and pelvis was performed using the standard protocol following bolus administration of intravenous contrast. RADIATION DOSE REDUCTION: This exam was performed according to the departmental dose-optimization program which includes automated exposure control, adjustment of the mA and/or kV according to patient size and/or use of iterative reconstruction technique. CONTRAST:  65mL OMNIPAQUE  IOHEXOL  350 MG/ML SOLN COMPARISON:  CT abdomen and pelvis 01/06/2024. CT abdomen and pelvis 07/06/2023. FINDINGS: Lower chest: No acute abnormality. Hepatobiliary: No focal liver abnormality is seen. Status post cholecystectomy. No biliary dilatation. Pancreas: Unremarkable. No pancreatic ductal dilatation or surrounding inflammatory changes. Spleen: Rounded low-attenuation area in the peripheral spleen measuring 1.9 cm, unchanged from prior. The spleen is normal in size. Adrenals/Urinary Tract: There are numerous rounded hypodensities in the kidneys which are too small to characterize, likely cysts. There is no hydronephrosis or perinephric fluid. The adrenal glands and bladder are within normal limits.  Stomach/Bowel: Stomach is within normal limits. Appendix appears normal. There is no evidence for bowel obstruction, pneumatosis or free air. There is wall thickening versus normal under distension of the rectosigmoid colon. Vascular/Lymphatic: Aortic atherosclerosis. No enlarged abdominal or pelvic lymph nodes. Reproductive: Uterus and bilateral adnexa are unremarkable. Other: No abdominal wall hernia or abnormality. No abdominopelvic ascites. Musculoskeletal: No fracture is seen. 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 IMPRESSION: 1. Wall thickening versus normal under distension of the rectosigmoid colon. Correlate clinically for colitis. 2. Stable 1.9 cm low-attenuation area in the spleen, likely a cyst. 3. Aortic atherosclerosis. Aortic Atherosclerosis (ICD10-I70.0). Electronically Signed   By: Tyron Gallon M.D.   On: 01/20/2024 21:52   DG Chest Portable 1 View Result Date: 01/20/2024 CLINICAL DATA:  Chest pain. EXAM: PORTABLE CHEST 1 VIEW COMPARISON:  01/15/2024. FINDINGS: Stable cardiomediastinal silhouette. Redemonstrated chronic discontinuities throughout the sternotomy wires. 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 No focal consolidation, pleural effusion, or pneumothorax. No acute osseous abnormality. IMPRESSION: No acute cardiopulmonary findings. Electronically Signed   By: Mannie Seek M.D.   On: 01/20/2024 16:47    Procedures Procedures    Medications Ordered in ED Medications  0.9 %  sodium chloride  infusion (Manually program via Guardrails IV Fluids) (0 mLs Intravenous Hold 01/20/24 1806)  aspirin  chewable tablet 324 mg (324 mg Oral Given 01/20/24 1557)  fentaNYL  (SUBLIMAZE ) injection 50 mcg (50 mcg Intravenous Given 01/20/24 1558)  ondansetron  (ZOFRAN ) injection 4 mg (4 mg Intravenous Given 01/20/24 2021)  metoCLOPramide  (REGLAN ) injection 10 mg (10 mg Intravenous Given 01/20/24 2053)  famotidine  (PEPCID ) IVPB  20 mg premix (0 mg Intravenous Stopped 01/20/24 2143)  sucralfate  (CARAFATE ) 1 GM/10ML suspension 1 g (1 g Oral Given 01/20/24 2105)  iohexol  (OMNIPAQUE ) 350 MG/ML injection 65 mL (65 mLs Intravenous Contrast Given 01/20/24 2138)    ED Course/ Medical Decision Making/ A&P                                 Medical Decision Making 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  Broad  differential including ACS, hypertensive emergency, aortic disruption, PE considered.  Patient has been seen and evaluated here several times in the past 2 weeks, review of the chart after initial evaluation with notation of reassuring CT PE study, no evidence for aortic disruption at that time. Cardiac 65 sinus normal pulse ox 100% room air normal  Amount and/or Complexity of Data Reviewed Independent Historian: EMS External Data Reviewed: notes.    Details: Recent ED visits reviewed Labs: ordered. Radiology: ordered and independent interpretation performed. Decision-making details documented in ED Course.    Details: Prior imaging including CT PE within the past days reviewed, no dissection, no PE. ECG/medicine tests: ordered and independent interpretation performed. Decision-making details documented in ED Course.  Risk OTC drugs. Prescription drug management. Decision regarding hospitalization. Diagnosis or treatment significantly limited by social determinants of health.  Update patient awake and alert, initial findings notable for anemia, given her cardiac history, ongoing chest pain some consideration of this with high output ischemic demand patient will receive transfusion which we discussed. 10:05 PM Patient continues to complain of upper abdominal pain.  Patient CT scan from a few days ago of her chest reviewed, unremarkable, CT abdomen pelvis 2 weeks ago reviewed, no obstruction at time, but given persistent p.o. intolerance CT  ordered.  10:05 PM Patient smiling, substantially improved with Pepcid , Reglan , Carafate .  CT without acute findings, the patient's pain is upper abdominal, no lower abdominal pain, colitis less likely.  Patient will continue transfusion, following these, is appropriate for discharge to follow-up as scheduled tomorrow with her physician for ongoing evaluation of her anemia.  On sign-out patient awaiting completion of transfusion and recheck. Final Clinical Impression(s) / ED Diagnoses Final diagnoses:  Symptomatic anemia    Rx / DC Orders ED Discharge Orders          Ordered    ondansetron  (ZOFRAN -ODT) 4 MG disintegrating tablet  Every 8 hours PRN        01/20/24 2205              Dorenda Gandy, MD 01/20/24 2205

## 2024-01-20 NOTE — ED Notes (Signed)
Patient ambulated to restroom with no difficulty.

## 2024-01-20 NOTE — Progress Notes (Unsigned)
 Cardiology Office Note    Date:  01/25/2024  ID:  [name], DOB 1961/04/19, MRN [id number] PCP:  Claudell Cruz, MD  Cardiologist:  Lauro Portal, MD  Electrophysiologist:  None   Chief Complaint: Chest pain   History of Present Illness: .    [name] is a 63 y.o. female with visit-pertinent history of CAD s/p CABG x 3 with LIMA to LAD, SVG to ramus branch, SVG to PDA in 06/2007 with subsequent stenting to SVG-PDA in 06/2019 and chronic chest pain, COPD, hypertension, hyperlipidemia, OSA not on CPAP, GERD and bipolar disorder.  Patient with long history of CAD, prior stenting to the RCA by Dr. Sharyn Deforest in the remote past and then CABG x 3 with LIMA to LAD, SVG to ramus branch and SVG to PDA in 06/2007.  Patient was admitted in 05/2019 with unstable angina.  Cardiac catheterization at that time showed severe native CAD with 50% proximal to mid followed by 95% stenosis of distal SVG to PDA.  She underwent successful PCI with 3 overlapping DES to distal SVG to PDA lesion.  She was treated with Aggrastat  for 18 hours following this.  She was readmitted in 02/2020 with NSTEMI.  Cardiac cath showed patent LIMA to LAD, diffusely diseased but patent SVG to ramus intermedius and total occlusion of the mid and distal segments of the SVG to PDA with left-to-right collaterals.  Echo showed 55 to 60% with normal wall motion.  Continued medical therapy was recommended and her Ranexa  was increased.  In 04/2021 patient reported chest pain with activity such as walking longer distances or shaking out her heavy bed comforter.  She describes the pain as heaviness as well as somewhat left arm, back and neck pain.  However pain was unchanged from prior visit with Dr. Dean Every in 10/2020 and resolved with rest and sublingual nitro.  She had also reported a syncopal episode a few months prior to visit that sounded like orthostatic hypotension/vasovagal syncope.  Echo was ordered given syncope and showed LVEF of  55 to 6% with normal wall motion no significant valvular disease.  Monitor was offered but patient declined.  She had a Myoview  performed on 01/06/2022 which was low risk with minimal inferior scar.  In 06/2023 patient underwent elective laparoscopic cholecystectomy, following she had nausea and vomiting abdominal pain which apparently led to some dehiscence of the umbilical incision.  Workup in the emergency room included troponin which were elevated at 396 and 406 respectively.  Patient reported that she did have chest pain in the prior evening that radiated into her arm and up to her jaw which she felt was reminiscent of her angina took a nitro for relief.  Echocardiogram with preserved LVEF but wall motion abnormality with paradoxical septal motion consistent with postop status, basal inferolateral segment and basal inferior segment hypokinetic, the entire anterior wall, anterolateral wall, mid to distal lateral wall, entire septum, entire apex and mid and distal inferior wall were normal it was felt that given known disease her troponin elevation was likely demand ischemia, Imdur  was trialed however did not feel that it had helped her symptoms.  Patient was continued on isosorbide  30 mg daily and Ranexa  500 mg twice daily.  Patient was last seen in clinic on 10/23/2023 by Dr. Katheryne Pane.  She did note effort angina, it was noted that she had recently undergone implantation of inspire device for sleep apnea as she was intolerant to CPAP.  Patient's ranolazine  was increased to 1  g twice daily.  On chart review on 01/06/2024 patient presented to the ED with abdominal pain, she also reported having bright red blood as well as black stools intermittent through out recent illness.  Patient reported that she had increased fatigue, abdominal pain, nausea, vomiting and diarrhea.  Patient's hemoglobin was 7.7, down from 14.6 3 months prior.  Patient was treated with 1 unit of PRBC.  Patient tested positive for Campylobacter  infection.  EGD indicated 3 cm hiatal hernia, 3 nonbleeding gastric ulcers with a clean ulcer base found in the antral region and gastritis as well as duodenitis, her PPI was increased to 40 mg twice daily and started on Carafate .  Patient was discharged on 01/08/2024.  On chart review on 01/15/2024 patient returned to the emergency department with increased shortness of breath, she reported that her chest discomfort was at her baseline.  She reported that her biggest concern was worsening breathing, she noted that she had improvement in her shortness of breath after given 1 unit of blood however this progressively worsened after discharge from the hospital.  Patient had an elevated D-dimer with subsequent CT angio chest PE without evidence of PE, pneumothorax, pneumonia or other acute cardiopulmonary abnormality, patient was anemic with hemoglobin of 7.9, upon discharge hemoglobin improved to 8.5. HsTrop 26>>26. Patient was discharged in stable condition.  Patient return to the ED on 01/20/2024 for chest pain, she reported that over the prior weeks she had had ongoing pain that was waxing and waning with some improvement with nitroglycerin .  Patient reported that that day her pain was more persistent including in the abdomen. Hstrop 12>>12.  Patient's hemoglobin was again 7.9, treated with another unit of RBCs.  Patient was further treated with Pepcid , Reglan  and Carafate  with improvement of symptoms.  Today she presents for follow-up.  She reports that she has had progressively worsening chest pain over the last week as well as shortness of breath, forcing her to "eat nitroglycerin  like candy". Patient states she had to stop and recover in the lobby for 45 minutes today before she was able to walk to the elevators in the office.  She reports that she required 4 to 5 sublingual nitroglycerin  yesterday and 5 on 6/4. She has taken one sublingual nitroglycerin  today.  Patient reports that prior to her GI bleeding she  had some intermittent chest pressure however in the last week she has had true pain in the left side of her chest.  She denies any palpitations, lower extremity edema, orthopnea or PND.  Labwork independently reviewed: 01/20/2024: Sodium 137, potassium 4.1, creatinine 1.23, AST 24, ALT 11, hemoglobin 7.9, troponin 12 ROS: .   Today she denies lower extremity edema, palpitations, hematuria, hemoptysis, diaphoresis, weakness, presyncope, syncope, orthopnea, and PND.  All other systems are reviewed and otherwise negative. Studies Reviewed: Aaron Aas   EKG:  EKG is ordered today, personally reviewed, demonstrating  EKG Interpretation Date/Time:  Friday January 25 2024 15:23:48 EDT Ventricular Rate:  61 PR Interval:  182 QRS Duration:  114 QT Interval:  448 QTC Calculation: 450 R Axis:   -11  Text Interpretation: Normal sinus rhythm Moderate voltage criteria for LVH, may be normal variant ( R in aVL , Cornell product ) Inferior infarct , age undetermined Anterolateral infarct , age undetermined When compared with ECG of 20-Jan-2024 15:35, No significant change was found Confirmed by Itati Brocksmith [phone]) on 01/25/2024 5:03:30 PM   CV Studies: Cardiac studies reviewed are outlined and summarized above. Otherwise please see EMR  for full report. Cardiac Studies & Procedures   ______________________________________________________________________________________________ CARDIAC CATHETERIZATION  CARDIAC CATHETERIZATION 03/01/2020  Conclusion  Patent left internal mammary graft to the LAD.  Diffusely diseased but patent saphenous vein graft to the ramus intermedius.  Total occlusion of the mid and distal segments of the saphenous vein graft to the PDA.  PDA and other left ventricular branches of the right coronary regular supplied by left to right collaterals.  Patent but diffusely diseased native left main.  Functional occlusion of the left anterior descending after the origin of the first diagonal.  The  segment from the ostial LAD into the diagonal is diffusely diseased up to 60 to 70%.  Patent circumflex artery that includes a single obtuse marginal.  Ostial to mid vessel luminal irregularities with eccentric stenoses up to 60 to 70%.  Unchanged from prior imaging.  Inferobasal akinesis.  EF 40%.  LVEDP 14 mmHg on IV nitroglycerin   RECOMMENDATIONS:   Continue aggressive risk factor modification.  Add long-acting nitrates to improve collateral flow.  IV nitroglycerin  is discontinued.  Subcutaneous heparin  DVT prophylaxis.  Continue dual antiplatelet therapy but consider clopidogrel  monotherapy after 3 months.  Findings Coronary Findings Diagnostic  Dominance: Right  Left Anterior Descending Prox LAD to Mid LAD lesion is 100% stenosed.  Right Coronary Artery Prox RCA lesion is 90% stenosed. Mid RCA lesion is 90% stenosed. Dist RCA lesion is 100% stenosed. The lesion was previously treated.  Right Posterior Descending Artery Collaterals RPDA filled by collaterals from Dist LAD.  First Right Posterolateral Branch Collaterals 1st RPL filled by collaterals from Ramus.  Graft To RPDA Prox Graft to Mid Graft lesion is 50% stenosed. Mid Graft lesion is 100% stenosed. Previously placed Dist Graft drug eluting stent is widely patent.  LIMA Graft To Dist LAD  Graft To Ramus Origin to Mid Graft lesion is 50% stenosed.  Intervention  No interventions have been documented.   CARDIAC CATHETERIZATION  CARDIAC CATHETERIZATION 06/23/2019  Conclusion Images from the original result were not included.   Prox Graft to Mid Graft lesion is 50% stenosed.  Dist Graft lesion is 95% stenosed.  Prox LAD to Mid LAD lesion is 100% stenosed.  Prox RCA lesion is 90% stenosed.  Mid RCA lesion is 90% stenosed.  Dist RCA lesion is 100% stenosed.  Origin to Mid Graft lesion is 50% stenosed.  A drug-eluting stent was successfully placed.  Post intervention, there is a 0%  residual stenosis.  [name] is a 63 y.o. female   [id number] LOCATION:  FACILITY: [facility] PHYSICIAN: Lauro Portal, M.D. 02-06-1961   DATE OF PROCEDURE:  06/23/2019  DATE OF DISCHARGE:     CARDIAC CATHETERIZATION / PCI DES PDA SVG    History obtained from chart review.[name] is a 5 -year-old severely overweight divorced Caucasian female mother of 2, grandmother and 3 grandchildren who I last saw in the office  5/20 virtually . Her mother, Grayson Leaf , as also patient of mine. She is currently in school getting her social workers degree an [employer].She has a history of RCA stenting by Dr. Dr. Sharyn Deforest in the past. She had coronary artery bypass grafting November 2008 with LIMA to LAD, vein to ramus branch and PDA. She recently stopped smoking 4 months ago. Her palms include hypertension, hyperlipidemia and obstructive sleep apnea intolerant to see. I catheterized her 03/04/10 revealing patent grafts and normal LV function. Her last  Myoview  performed 04/18/11 with nonischemic. She was experiencing a lot of stress in her  life and developed chest pain. She saw Isabelle Maple, nurse practitioner on 02/11/16. At that time, she was up for cardiac catheterization which was performed 4 days later by Dr. Peter Swaziland. He demonstrated widely patent grafts, normal LV function and unchanged anatomy compared to her cath performed in 2011.  Since I saw her 2 6 months ago she did start smoking again up until 2 weeks ago.  She has had progressive exertional chest pain.  She was admitted to the hospital with unstable angina.  Enzymes were negative.  EKG showed no acute changes.  Her EF was normal by 2D echo.  She was placed on IV heparin .  She presents now for diagnostic coronary angiography.   PROCEDURE DESCRIPTION:  The patient was brought to the second floor [facility]iac cath lab in the postabsorptive state.  She was premedicated with IV Versed  and fentanyl .  Her right groinwas prepped  and shaved in usual sterile fashion. Xylocaine  1% was used for local anesthesia. A 5 French sheath was inserted into the right common femoral artery using standard Seldinger technique.  5 French right left Judkins diagnostic catheters on the 5 French pigtail catheter were used for selective coronary angiography, selective vein graft and IMA graft angiography and obtain left heart pressures.  On the pigtail was used for the entirety of the case.  Retrograde aorta, ventricular and pullback pressures were recorded.  The culprit lesion was the distal PDA SVG high-grade 95% stenosis.  Remainder of her coronary artery was for the most part unchanged from her prior cath performed Dr. Swaziland 4 years ago.  The patient received Angiomax  bolus followed followed by infusion, Plavix  600 mg p.o. load followed by IV Pepcid .  Her ACT was therapeutic.  Using a 6 [place] JR4 guide catheter along with a 0.14 [place]ater guide wire and a 2 mm x 12 mm balloon the lesion was fairly easily crossed and guidewire entered the distal end of the graft into the native PDA.  I then predilated the lesion with a 2 mm x 12 mm balloon and placed a 3.5 mm x 20 mm long Synergy drug-eluting stent across the culprit lesion and deployed at 12 atm (3.6 mm).  After balloon deflation there was no reflow and extensive thrombus both upstream and downstream.  There was not enough room to place a spider distal protection device beyond the culprit lesion to provide distal protection.  I did a minister to 200 mcg of intercoronary nitroglycerin  as well as 300 mcg of intracoronary verapamil .  I then placed a 3.5 mm x 26 mm long resolute Onyx drug-eluting stent upstream from the previously placed stent covering nightly thrombotic lesion and a 3.5 mm x 22 mm long resolute Onyx drug-eluting stent distal to the first stent covering distal thrombotic lesions up to the distal anastomosis.  All stents were deployed at 12 atm.  The final angiographic result was reduction  of a 95% distal PDA SVG stenosis to 0% residual TIMI-3 flow.  The patient did have chest pain at the end of the procedure although her EKG did improve.  She received intravenous fentanyl  and IV nitroglycerin .  In addition, I bolused her with intravenous Aggrastat  and placed her on an Aggrastat  drip which we will continue for 18 hours.  Impression Successful PDA SVG PCI drug-eluting stenting deploying 3 drug-eluting stents (3.5 x 20 Synergy, 3.5 x 26 and 22 resolute Onyx) with ultimate resolution of her no reflow and covering of the diseased segment as well as the thrombotic  lesions both proximal and distal to the original stent.  It is unclear what caused the thrombotic lesions.  She was having some chest pain at the end of the procedure which I suspect will gradually improve.  EKG showed no acute changes.  The Angiomax  was turned off and the Aggrastat  continues for 18 hours.  She will need dual antiplatelet therapy uninterrupted for 12 months per guideline directed medical therapy.  Lauro Portal. MD, [facility] 06/23/2019 9:43 AM  Findings Coronary Findings Diagnostic  Dominance: Right  Left Anterior Descending Prox LAD to Mid LAD lesion is 100% stenosed.  Right Coronary Artery Prox RCA lesion is 90% stenosed. Mid RCA lesion is 90% stenosed. Dist RCA lesion is 100% stenosed. The lesion was previously treated.  Graft To RPDA Prox Graft to Mid Graft lesion is 50% stenosed. Dist Graft lesion is 95% stenosed.  LIMA Graft To Dist LAD  Graft To Ramus Origin to Mid Graft lesion is 50% stenosed.  Intervention  Dist Graft lesion (Graft To RPDA) Stent Pre-stent angioplasty was performed. A drug-eluting stent was successfully placed. Stent strut is well apposed. Post-stent angioplasty was performed. Post-Intervention Lesion Assessment The intervention was successful. Pre-interventional TIMI flow is 3. Post-intervention TIMI flow is 3. Treated lesion length:  60 mm. No-reflow occurred during the  intervention. IC nitroglycerin  and verapamil  was given. At this lesion, a thrombus occurred. There is a 0% residual stenosis post intervention.   STRESS TESTS  MYOCARDIAL PERFUSION IMAGING 01/06/2022  Narrative   No ST deviation was noted.   Left ventricular function is normal. Nuclear stress EF: 62 %. The left ventricular ejection fraction is normal (55-65%). End diastolic cavity size is normal. End systolic cavity size is normal.   Prior study available for comparison from 04/18/2011.  Small inferior basal wall infarct with no ischemia EF preserved 62%   ECHOCARDIOGRAM  ECHOCARDIOGRAM COMPLETE 07/01/2023  Narrative ECHOCARDIOGRAM REPORT    Patient Name:   VICKEE MORMINO Date of Exam: 07/01/2023 Medical Rec #:  [id number]         Height:       65.0 in Accession #:    [id number]        Weight:       192.9 lb Date of Birth:  1960-10-07         BSA:          1.948 m Patient Age:    62 years          BP:           104/60 mmHg Patient Gender: F                 HR:           67 bpm. Exam Location:  Inpatient  Procedure: 2D Echo, Cardiac Doppler and Color Doppler  Indications:    Elevated Troponin  History:        Patient has prior history of Echocardiogram examinations, most recent 05/25/2021. CAD and NSTEMI, Prior CABG, COPD; Risk Factors:Sleep Apnea, Dyslipidemia and Hypertension.  Sonographer:    Melissa Kafa Referring Phys: [id number] KENNETH C HILTY  IMPRESSIONS   1. Left ventricular ejection fraction, by estimation, is 50 to 55%. The left ventricle has low normal function. The left ventricle demonstrates regional wall motion abnormalities (see scoring diagram/findings for description). Left ventricular diastolic parameters were normal. 2. Right ventricular systolic function is normal. The right ventricular size is normal. Tricuspid regurgitation signal is inadequate for assessing PA pressure. 3. Left atrial size  was moderately dilated. 4. The mitral valve is normal in  structure. No evidence of mitral valve regurgitation. No evidence of mitral stenosis. 5. The aortic valve is tricuspid. Aortic valve regurgitation is not visualized. Aortic valve sclerosis is present, with no evidence of aortic valve stenosis. 6. The inferior vena cava is normal in size with greater than 50% respiratory variability, suggesting right atrial pressure of 3 mmHg.  Comparison(s): Prior images reviewed side by side. Direct comparison shows a similar wall motion abnormality on the 2021 study.  FINDINGS Left Ventricle: Left ventricular ejection fraction, by estimation, is 50 to 55%. The left ventricle has low normal function. The left ventricle demonstrates regional wall motion abnormalities. The left ventricular internal cavity size was normal in size. There is no left ventricular hypertrophy. Abnormal (paradoxical) septal motion consistent with post-operative status. Left ventricular diastolic parameters were normal. Normal left ventricular filling pressure.   LV Wall Scoring: The basal inferolateral segment and basal inferior segment are hypokinetic. The entire anterior wall, antero-lateral wall, mid and distal lateral wall, entire septum, entire apex, and mid and distal inferior wall are normal.  Right Ventricle: The right ventricular size is normal. No increase in right ventricular wall thickness. Right ventricular systolic function is normal. Tricuspid regurgitation signal is inadequate for assessing PA pressure.  Left Atrium: Left atrial size was moderately dilated.  Right Atrium: Right atrial size was normal in size.  Pericardium: There is no evidence of pericardial effusion.  Mitral Valve: The mitral valve is normal in structure. No evidence of mitral valve regurgitation. No evidence of mitral valve stenosis.  Tricuspid Valve: The tricuspid valve is normal in structure. Tricuspid valve regurgitation is not demonstrated.  Aortic Valve: The aortic valve is tricuspid.  Aortic valve regurgitation is not visualized. Aortic valve sclerosis is present, with no evidence of aortic valve stenosis. Aortic valve mean gradient measures 7.0 mmHg. Aortic valve peak gradient measures 14.6 mmHg. Aortic valve area, by VTI measures 2.30 cm.  Pulmonic Valve: The pulmonic valve was normal in structure. Pulmonic valve regurgitation is trivial. No evidence of pulmonic stenosis.  Aorta: The aortic root and ascending aorta are structurally normal, with no evidence of dilitation.  Venous: The inferior vena cava is normal in size with greater than 50% respiratory variability, suggesting right atrial pressure of 3 mmHg.  IAS/Shunts: No atrial level shunt detected by color flow Doppler.   LEFT VENTRICLE PLAX 2D LVIDd:         4.80 cm   Diastology LVIDs:         3.80 cm   LV e' medial:    7.40 cm/s LV PW:         1.10 cm   LV E/e' medial:  13.0 LV IVS:        1.20 cm   LV e' lateral:   11.00 cm/s LVOT diam:     2.00 cm   LV E/e' lateral: 8.7 LV SV:         83 LV SV Index:   42 LVOT Area:     3.14 cm   RIGHT VENTRICLE RV Basal diam:  3.00 cm RV Mid diam:    2.60 cm RV S prime:     13.70 cm/s TAPSE (M-mode): 1.9 cm  LEFT ATRIUM             Index        RIGHT ATRIUM           Index LA diam:  4.60 cm 2.36 cm/m   RA Area:     16.40 cm LA Vol (A2C):   75.8 ml 38.91 ml/m  RA Volume:   38.20 ml  19.61 ml/m LA Vol (A4C):   57.8 ml 29.67 ml/m LA Biplane Vol: 67.0 ml 34.39 ml/m AORTIC VALVE AV Area (Vmax):    2.06 cm AV Area (Vmean):   2.06 cm AV Area (VTI):     2.30 cm AV Vmax:           191.00 cm/s AV Vmean:          124.000 cm/s AV VTI:            0.360 m AV Peak Grad:      14.6 mmHg AV Mean Grad:      7.0 mmHg LVOT Vmax:         125.00 cm/s LVOT Vmean:        81.200 cm/s LVOT VTI:          0.263 m LVOT/AV VTI ratio: 0.73  AORTA Ao Root diam: 3.30 cm Ao Asc diam:  3.30 cm  MITRAL VALVE MV Area (PHT): 2.10 cm    SHUNTS MV Decel Time: 362 msec     Systemic VTI:  0.26 m MV E velocity: 95.90 cm/s  Systemic Diam: 2.00 cm MV A velocity: 84.90 cm/s MV E/A ratio:  1.13  Mihai Croitoru MD Electronically signed by Luana Rumple MD Signature Date/Time: 07/01/2023/3:52:14 PM    Final          ______________________________________________________________________________________________       Current Reported Medications:.    Current Meds  Medication Sig   albuterol  (PROVENTIL  HFA;VENTOLIN  HFA) 108 (90 BASE) MCG/ACT inhaler Inhale 2 puffs into the lungs every 6 (six) hours as needed for wheezing or shortness of breath.    Alirocumab  (PRALUENT ) 150 MG/ML SOAJ Inject 1 mL (150 mg total) into the skin every 14 (fourteen) days.   amLODipine  (NORVASC ) 5 MG tablet Take 1 tablet (5 mg total) by mouth daily.   aspirin  EC 81 MG tablet Take 1 tablet (81 mg total) by mouth daily. Swallow whole.   atorvastatin  (LIPITOR ) 80 MG tablet Take 1 tablet (80 mg total) by mouth daily.   brimonidine  (ALPHAGAN ) 0.15 % ophthalmic solution Place 1 drop into both eyes 2 (two) times daily.    Budeson-Glycopyrrol-Formoterol  (BREZTRI  AEROSPHERE) 160-9-4.8 MCG/ACT AERO Inhale 2 puffs into the lungs in the morning and at bedtime.   clopidogrel  (PLAVIX ) 75 MG tablet Take 1 tablet (75 mg total) by mouth daily.   escitalopram  (LEXAPRO ) 20 MG tablet Take 20 mg by mouth daily.   fluticasone  (FLONASE ) 50 MCG/ACT nasal spray Place 2 sprays into both nostrils daily. (Patient taking differently: Place 2 sprays into both nostrils daily as needed for allergies.)   icosapent  Ethyl (VASCEPA ) 1 g capsule TAKE 2 CAPSULES BY MOUTH 2 TIMES DAILY.   isosorbide  mononitrate (IMDUR ) 30 MG 24 hr tablet Take 3 tablets (90 mg total) by mouth daily.   lamoTRIgine  (LAMICTAL ) 100 MG tablet Take 100 mg by mouth daily.   metoprolol  tartrate (LOPRESSOR ) 25 MG tablet Take 0.5 tablets (12.5 mg total) by mouth 2 (two) times daily.   niacin  (NIASPAN ) 1000 MG CR tablet TAKE 1 TABLET (1,000 MG  TOTAL) BY MOUTH AT BEDTIME.   nitroGLYCERIN  (NITROSTAT ) 0.4 MG SL tablet Place 1 tablet (0.4 mg total) under the tongue every 5 (five) minutes as needed for chest pain.   ondansetron  (ZOFRAN -ODT) 4 MG disintegrating tablet Take  1 tablet (4 mg total) by mouth every 8 (eight) hours as needed for nausea or vomiting.   pantoprazole  (PROTONIX ) 40 MG tablet Take 1 tablet (40 mg total) by mouth 2 (two) times daily.   QUEtiapine  Fumarate 150 MG TABS Take 150 mg by mouth at bedtime.   ranolazine  (RANEXA ) 500 MG 12 hr tablet TAKE 1 TABLET BY MOUTH TWICE A DAY (Patient taking differently: Take 1,000 mg by mouth 2 (two) times daily. 1 gram twice daily)   sucralfate  (CARAFATE ) 1 g tablet Carafate  1 g 4 times daily x 2 weeks and  then twice daily for 1 month. Then may stop.   VITAMIN E PO Take 1 tablet by mouth daily.   Physical Exam:    VS:  BP 136/80   Pulse (!) 58   Ht 5' 5.5" (1.664 m)   Wt 197 lb (89.4 kg)   LMP 11/16/2011   SpO2 97%   BMI 32.28 kg/m    Wt Readings from Last 3 Encounters:  01/25/24 197 lb (89.4 kg)  01/20/24 187 lb (84.8 kg)  01/06/24 187 lb (84.8 kg)    GEN: Well nourished, well developed in no acute distress NECK: No JVD; No carotid bruits CARDIAC: RRR, no murmurs, rubs, gallops RESPIRATORY:  Clear to auscultation without rales, wheezing or rhonchi  ABDOMEN: Soft, non-tender, non-distended EXTREMITIES:  No edema; No acute deformity     Asessement and Plan:.    CAD: S/p RCA stenting by Dr. Sharyn Deforest in the remote past, in 06/2007 she had bypass surgery with a LIMA to her LAD, vein to ramus branch and PDA.  Catheterization in 2011 revealed patent grafts and normal LV function.  In June 2017 she was catheterized by Dr. [place] revealing unchanged anatomy.  In 05/2019 she was hospitalized with unstable angina, cardiac catheterization revealed high-grade distal PDA SVG stenosis which was stented, this was complicated by no reflow requiring additional stenting and Aggrastat .  In  02/2020 she was readmitted with non-STEMI and underwent diagnostic cath by Dr. Felipe Horton revealing occluded RCA vein graft that had previously been stented with left-to-right collaterals, medical therapy was recommended.  Myoview  in 12/2021 was low risk with minimal inferior scar.  At last office visit in January her ranolazine  was increased from 500 p.o. twice daily to 1 g p.o. twice daily.  Today patient reports that her chest pain has progressively worsening in recent weeks with her ongoing anemia, notes that she typically does have some baseline chest pressure however in the last week she has had severe chest pain requiring multiple sublingual nitroglycerin  since her recent ED evaluation, she also reports increased shortness of breath which may be related to anemia and her history of CAD. Discussed with Dr. Lorie Rook, DOD at [facility] office who recommended she present to the ED for further evaluation. Discussed with patient who is in agreement with emergency department evaluation, she refused emergency transport, states she will present to [facility] ED from [facility].   HTN: Blood pressure today 136/80.  Continue amlodipine  5 mg daily, Imdur  90 mg daily, metoprolol  tartrate 12.5 mg twice daily.  OSA: Patient with history of intolerance to CPAP, patient had inspire device implanted earlier this year.  GI bleed: Patient requiring greater than 3 blood transfusions in the past 2-1/2 weeks.  Patient was admitted in May for acute GI bleed, patient tested positive for Campylobacter infection.  EGD indicated 3 cm hiatal hernia, 3 nonbleeding gastric ulcers with a clean ulcer base found in the antral region  and gastritis as well as duodenitis, her PPI was increased to 40 mg twice daily and started on Carafate  1 g 4 times daily for 2 weeks then twice daily for a month.  Patient reporting worsening chest pain and shortness of breath, requiring 45 minutes to recover in office lobby before her appointment.   Given frequent decreases in hemoglobin, directed patient to [facility], ED for acute evaluation and possible need for blood transfusion.   Disposition: Patient to present to ED for further evaluation of chest pain, shortness of breath and anemia. Follow up with cardiology after evaluation.   Signed, Annalynn Centanni D Oluwaseun Cremer, NP

## 2024-01-21 ENCOUNTER — Telehealth (INDEPENDENT_AMBULATORY_CARE_PROVIDER_SITE_OTHER): Payer: Self-pay

## 2024-01-21 DIAGNOSIS — D649 Anemia, unspecified: Secondary | ICD-10-CM | POA: Diagnosis not present

## 2024-01-21 NOTE — Telephone Encounter (Signed)
 Spoke with pharmacy about flonase  prescription. The pharmacy is working on the refill.

## 2024-01-21 NOTE — ED Provider Notes (Signed)
 I assumed care of this patient from previous provider.  Please see their note for further details of history, exam, and MDM.   Briefly patient is a 63 y.o. female who presented with upper abdominal and chest discomfort with a reassuring workup other than anemia with a hemoglobin of 7.9 currently receiving 2 units of packed red blood cells.  Plan to reassess.  Anticipate discharge.  3:59 AM Patient reports feeling significantly better after the transfusion.  Will follow-up with her care team.  The patient appears reasonably screened and/or stabilized for discharge and I doubt any other medical condition or other [facility] requiring further screening, evaluation, or treatment in the ED at this time. I have discussed the findings, Dx and Tx plan with the patient/family who expressed understanding and agree(s) with the plan. Discharge instructions discussed at length. The patient/family was given strict return precautions who verbalized understanding of the instructions. No further questions at time of discharge.  Disposition: Discharge  Condition: Good  ED Discharge Orders          Ordered    ondansetron  (ZOFRAN -ODT) 4 MG disintegrating tablet  Every 8 hours PRN        01/20/24 2205              Follow Up: Your physician  In 1 day as scheduled  Pa, [facility] [address] [place] [address] [phone]  Schedule an appointment as soon as possible for a visit    Mansouraty, Albino Alu., MD [address] [place] [address] [phone]  Schedule an appointment as soon as possible for a visit           Rosser Collington, Camila Cecil, MD 01/21/24 0400

## 2024-01-22 LAB — TYPE AND SCREEN
ABO/RH(D): A POS
Antibody Screen: NEGATIVE
Unit division: 0
Unit division: 0

## 2024-01-22 LAB — BPAM RBC
Blood Product Expiration Date: 202506262359
ISSUE DATE / TIME: 202506020057
ISSUE DATE / TIME: 202506262359
PRODUCT CODE: 202506012101
PRODUCT CODE: 202506262359
Unit Type and Rh: 6200
Unit Type and Rh: 202506262359
Unit Type and Rh: 6200
Unit Type and Rh: 6200

## 2024-01-23 ENCOUNTER — Other Ambulatory Visit: Payer: Self-pay | Admitting: Cardiovascular Disease

## 2024-01-23 LAB — MISCELLANEOUS TEST

## 2024-01-24 ENCOUNTER — Other Ambulatory Visit: Payer: Self-pay | Admitting: Cardiovascular Disease

## 2024-01-24 ENCOUNTER — Other Ambulatory Visit: Payer: Self-pay

## 2024-01-24 DIAGNOSIS — I10 Essential (primary) hypertension: Secondary | ICD-10-CM

## 2024-01-24 DIAGNOSIS — E785 Hyperlipidemia, unspecified: Secondary | ICD-10-CM

## 2024-01-24 MED ORDER — METOPROLOL TARTRATE 25 MG PO TABS: 12.5000 mg | ORAL_TABLET | Freq: Two times a day (BID) | ORAL | 3 refills | Status: DC

## 2024-01-24 MED ORDER — ATORVASTATIN CALCIUM 80 MG PO TABS: 80.0000 mg | ORAL_TABLET | Freq: Every day | ORAL | 1 refills | Status: DC

## 2024-01-24 MED ORDER — METOPROLOL TARTRATE 25 MG PO TABS: 12.5000 mg | ORAL_TABLET | Freq: Two times a day (BID) | ORAL | 3 refills | Status: AC

## 2024-01-24 MED ORDER — AMLODIPINE BESYLATE 5 MG PO TABS: 5.0000 mg | ORAL_TABLET | Freq: Every day | ORAL | 3 refills | Status: AC

## 2024-01-24 MED ORDER — ISOSORBIDE MONONITRATE ER 30 MG PO TB24: 90.0000 mg | ORAL_TABLET | Freq: Every day | ORAL | 3 refills | Status: DC

## 2024-01-24 MED ORDER — NITROGLYCERIN 0.4 MG SL SUBL: 0.4000 mg | SUBLINGUAL_TABLET | SUBLINGUAL | 11 refills | Status: AC | PRN

## 2024-01-24 NOTE — Telephone Encounter (Signed)
*  STAT* If patient is at the pharmacy, call can be transferred to refill team.   1. Which medications need to be refilled? (please list name of each medication and dose if known)   nitroGLYCERIN  (NITROSTAT ) 0.4 MG SL tablet  metoprolol  tartrate (LOPRESSOR ) 25 MG tablet (completely out) amLODipine  (NORVASC ) 5 MG tablet (completely out) atorvastatin  (LIPITOR ) 80 MG tablet (completely out) clopidogrel  (PLAVIX ) 75 MG tablet  isosorbide  mononitrate (IMDUR ) 30 MG 24 hr tablet   2. Would you like to learn more about the convenience, safety, & potential cost savings by using the [facility]?   3. Are you open to using the [facility] (Type [facility]. ).  4. Which pharmacy/location (including street and city if local pharmacy) is medication to be sent to?  [id number] - [place], [place] - [address]   5. Do they need a 30 day or 90 day supply?  90 day  Patient stated she is almost out of these medications and will be changing to new pharmacy.

## 2024-01-24 NOTE — Telephone Encounter (Signed)
 Pt is requesting a refill on medication clopidogrel . This medication was prescribed in the hospital. Would Dr. Katheryne Pane like to refill this medication? Please address

## 2024-01-24 NOTE — Telephone Encounter (Signed)
 Additional:  Patient has appointment scheduled with K. West on 6/6.

## 2024-01-25 ENCOUNTER — Encounter: Payer: Self-pay | Admitting: Cardiology

## 2024-01-25 ENCOUNTER — Telehealth: Payer: Self-pay

## 2024-01-25 ENCOUNTER — Ambulatory Visit: Admitting: Adult Health

## 2024-01-25 ENCOUNTER — Ambulatory Visit: Attending: Cardiology | Admitting: Cardiology

## 2024-01-25 VITALS — BP 136/80 | HR 58 | Ht 65.5 in | Wt 197.0 lb

## 2024-01-25 DIAGNOSIS — Z9861 Coronary angioplasty status: Secondary | ICD-10-CM

## 2024-01-25 DIAGNOSIS — G4733 Obstructive sleep apnea (adult) (pediatric): Secondary | ICD-10-CM

## 2024-01-25 DIAGNOSIS — E7800 Pure hypercholesterolemia, unspecified: Secondary | ICD-10-CM

## 2024-01-25 DIAGNOSIS — Z951 Presence of aortocoronary bypass graft: Secondary | ICD-10-CM | POA: Diagnosis present

## 2024-01-25 DIAGNOSIS — R079 Chest pain, unspecified: Secondary | ICD-10-CM

## 2024-01-25 DIAGNOSIS — I2510 Atherosclerotic heart disease of native coronary artery without angina pectoris: Secondary | ICD-10-CM

## 2024-01-25 DIAGNOSIS — I25709 Atherosclerosis of coronary artery bypass graft(s), unspecified, with unspecified angina pectoris: Secondary | ICD-10-CM | POA: Diagnosis present

## 2024-01-25 DIAGNOSIS — I25118 Atherosclerotic heart disease of native coronary artery with other forms of angina pectoris: Secondary | ICD-10-CM

## 2024-01-25 DIAGNOSIS — K922 Gastrointestinal hemorrhage, unspecified: Secondary | ICD-10-CM | POA: Diagnosis present

## 2024-01-25 DIAGNOSIS — I10 Essential (primary) hypertension: Secondary | ICD-10-CM | POA: Diagnosis present

## 2024-01-25 NOTE — Telephone Encounter (Signed)
 Called patient to check to see if they had gone to the Emergency Room as advised by Kaltyn West NP.  Patient Stated that they had gone to the Emergency room desk and claims that they said they had no idea she was coming over. She had left the emergency room and had went home and claimed that if she had another event she will call an Ambulance.

## 2024-01-25 NOTE — Patient Instructions (Signed)
 Medication Instructions:  No changes *If you need a refill on your cardiac medications before your next appointment, please call your pharmacy*  Lab Work: No labs If you have labs (blood work) drawn today and your tests are completely normal, you will receive your results only by: MyChart Message (if you have MyChart) OR A paper copy in the mail If you have any lab test that is abnormal or we need to change your treatment, we will call you to review the results.  Testing/Procedures: No testing  Follow-Up: At [facility], you and your health needs are our priority.  As part of our continuing mission to provide you with exceptional heart care, our providers are all part of one team.  This team includes your primary Cardiologist (physician) and Advanced Practice Providers or APPs (Physician Assistants and Nurse Practitioners) who all work together to provide you with the care you need, when you need it.  Your next appointment:   [address] [place], [place], [place] [address]  We recommend signing up for the patient portal called "MyChart".  Sign up information is provided on this After Visit Summary.  MyChart is used to connect with patients for Virtual Visits (Telemedicine).  Patients are able to view lab/test results, encounter notes, upcoming appointments, etc.  Non-urgent messages can be sent to your provider as well.   To learn more about what you can do with MyChart, go to [email or web address].

## 2024-01-25 NOTE — Telephone Encounter (Signed)
 Called patient as it did not show on her chart that she had arrived to [facility] ED. Patient reported that she presented to the emergency department however was told that it could be a 4 to 6-hour wait, patient elected to leave the emergency department without being evaluated. I directed patient to return to the emergency department given symptoms that she described at office visit, discussed with patient the risk involved without being further evaluated. Patient verbalized that she is aware of risk involved and that she would present to the emergency department as directed.

## 2024-01-28 ENCOUNTER — Other Ambulatory Visit: Payer: Self-pay

## 2024-01-28 ENCOUNTER — Emergency Department (HOSPITAL_COMMUNITY)

## 2024-01-28 ENCOUNTER — Encounter (HOSPITAL_COMMUNITY): Payer: Self-pay

## 2024-01-28 ENCOUNTER — Emergency Department (HOSPITAL_COMMUNITY)
Admission: EM | Admit: 2024-01-28 | Discharge: 2024-01-29 | Disposition: A | Discharge status: Home / Self Care | Attending: Emergency Medicine | Admitting: Emergency Medicine

## 2024-01-28 DIAGNOSIS — Z79899 Other long term (current) drug therapy: Secondary | ICD-10-CM | POA: Diagnosis not present

## 2024-01-28 DIAGNOSIS — I10 Essential (primary) hypertension: Secondary | ICD-10-CM | POA: Insufficient documentation

## 2024-01-28 DIAGNOSIS — R0789 Other chest pain: Secondary | ICD-10-CM | POA: Insufficient documentation

## 2024-01-28 DIAGNOSIS — Z951 Presence of aortocoronary bypass graft: Secondary | ICD-10-CM | POA: Diagnosis not present

## 2024-01-28 DIAGNOSIS — I2510 Atherosclerotic heart disease of native coronary artery without angina pectoris: Secondary | ICD-10-CM | POA: Diagnosis not present

## 2024-01-28 DIAGNOSIS — I2089 Other forms of angina pectoris: Principal | ICD-10-CM | POA: Diagnosis present

## 2024-01-28 LAB — CBC
HCT: 34 % — ABNORMAL LOW (ref 36.0–46.0)
Hemoglobin: 10.5 g/dL — ABNORMAL LOW (ref 12.0–15.0)
MCH: 28.4 pg (ref 26.0–34.0)
MCHC: 30.9 g/dL (ref 30.0–36.0)
MCV: 91.9 fL (ref 80.0–100.0)
Platelets: 464 10*3/uL — ABNORMAL HIGH (ref 150–400)
RBC: 3.7 MIL/uL — ABNORMAL LOW (ref 3.87–5.11)
RDW: 16.2 % — ABNORMAL HIGH (ref 11.5–15.5)
WBC: 11.3 10*3/uL — ABNORMAL HIGH (ref 4.0–10.5)
nRBC: 0 % (ref 0.0–0.2)

## 2024-01-28 LAB — BASIC METABOLIC PANEL WITH GFR
Anion gap: 10 (ref 5–15)
BUN: 17 mg/dL (ref 8–23)
CO2: 24 mmol/L (ref 22–32)
Calcium: 8.7 mg/dL — ABNORMAL LOW (ref 8.9–10.3)
Chloride: 105 mmol/L (ref 98–111)
Creatinine, Ser: 1.29 mg/dL — ABNORMAL HIGH (ref 0.44–1.00)
GFR, Estimated: 47 mL/min — ABNORMAL LOW (ref >60)
Glucose, Bld: 122 mg/dL — ABNORMAL HIGH (ref 70–99)
Potassium: 4.4 mmol/L (ref 3.5–5.1)
Sodium: 139 mmol/L (ref 135–145)

## 2024-01-28 LAB — TROPONIN I (HIGH SENSITIVITY)
Troponin I (High Sensitivity): 10 ng/L (ref <18)
Troponin I (High Sensitivity): 10 ng/L (ref <18)

## 2024-01-28 MED ORDER — ATORVASTATIN CALCIUM 40 MG PO TABS: 80.0000 mg | ORAL_TABLET | Freq: Every day | ORAL | Status: DC

## 2024-01-28 MED ORDER — ENOXAPARIN SODIUM 40 MG/0.4ML IJ SOSY: 40.0000 mg | PREFILLED_SYRINGE | INTRAMUSCULAR | Status: DC

## 2024-01-28 MED ORDER — CLOPIDOGREL BISULFATE 75 MG PO TABS: 75.0000 mg | ORAL_TABLET | Freq: Every day | ORAL | Status: DC

## 2024-01-28 MED ORDER — PANTOPRAZOLE SODIUM 40 MG PO TBEC
40.0000 mg | DELAYED_RELEASE_TABLET | Freq: Two times a day (BID) | ORAL | Status: DC
  Administered 2024-01-29: 40 mg via ORAL
  Filled 2024-01-28: qty 1

## 2024-01-28 MED ORDER — BRIMONIDINE TARTRATE 0.15 % OP SOLN
1.0000 [drp] | Freq: Two times a day (BID) | OPHTHALMIC | Status: DC
  Administered 2024-01-29: 1 [drp] via OPHTHALMIC
  Filled 2024-01-28: qty 5

## 2024-01-28 MED ORDER — ALBUTEROL SULFATE (2.5 MG/3ML) 0.083% IN NEBU: 3.0000 mL | INHALATION_SOLUTION | Freq: Four times a day (QID) | RESPIRATORY_TRACT | Status: DC | PRN

## 2024-01-28 MED ORDER — FLUTICASONE PROPIONATE 50 MCG/ACT NA SUSP: 2.0000 | Freq: Every day | NASAL | Status: DC | PRN

## 2024-01-28 MED ORDER — AMLODIPINE BESYLATE 5 MG PO TABS: 5.0000 mg | ORAL_TABLET | Freq: Every day | ORAL | Status: DC

## 2024-01-28 MED ORDER — RANOLAZINE ER 500 MG PO TB12
1000.0000 mg | ORAL_TABLET | Freq: Two times a day (BID) | ORAL | Status: DC
  Administered 2024-01-29: 1000 mg via ORAL
  Filled 2024-01-28: qty 2

## 2024-01-28 MED ORDER — METOPROLOL TARTRATE 25 MG PO TABS
12.5000 mg | ORAL_TABLET | Freq: Two times a day (BID) | ORAL | Status: DC
  Administered 2024-01-29: 12.5 mg via ORAL
  Filled 2024-01-28 (×2): qty 1

## 2024-01-28 MED ORDER — ASPIRIN 81 MG PO TBEC: 81.0000 mg | DELAYED_RELEASE_TABLET | Freq: Every day | ORAL | Status: DC

## 2024-01-28 MED ORDER — IOHEXOL 350 MG/ML SOLN
75.0000 mL | Freq: Once | INTRAVENOUS | Status: AC | PRN
  Administered 2024-01-28: 75 mL via INTRAVENOUS

## 2024-01-28 MED ORDER — QUETIAPINE FUMARATE 100 MG PO TABS
150.0000 mg | ORAL_TABLET | Freq: Every day | ORAL | Status: DC
  Filled 2024-01-28: qty 2

## 2024-01-28 MED ORDER — SUCRALFATE 1 G PO TABS
1.0000 g | ORAL_TABLET | Freq: Two times a day (BID) | ORAL | Status: DC
  Administered 2024-01-29: 1 g via ORAL
  Filled 2024-01-28: qty 1

## 2024-01-28 MED ORDER — BUDESON-GLYCOPYRROL-FORMOTEROL 160-9-4.8 MCG/ACT IN AERO
2.0000 | INHALATION_SPRAY | Freq: Two times a day (BID) | RESPIRATORY_TRACT | Status: DC
  Administered 2024-01-29: 2 via RESPIRATORY_TRACT

## 2024-01-28 MED ORDER — ESCITALOPRAM OXALATE 10 MG PO TABS: 20.0000 mg | ORAL_TABLET | Freq: Every day | ORAL | Status: DC

## 2024-01-28 MED ORDER — LAMOTRIGINE 25 MG PO TABS: 100.0000 mg | ORAL_TABLET | Freq: Every day | ORAL | Status: DC

## 2024-01-28 MED ORDER — NIACIN ER (ANTIHYPERLIPIDEMIC) 500 MG PO TBCR
1000.0000 mg | EXTENDED_RELEASE_TABLET | Freq: Every day | ORAL | Status: DC
  Administered 2024-01-29: 1000 mg via ORAL
  Filled 2024-01-28: qty 2

## 2024-01-28 MED ORDER — ISOSORBIDE MONONITRATE ER 30 MG PO TB24: 90.0000 mg | ORAL_TABLET | Freq: Every day | ORAL | Status: DC

## 2024-01-28 MED ORDER — ICOSAPENT ETHYL 1 G PO CAPS: 2.0000 g | ORAL_CAPSULE | Freq: Two times a day (BID) | ORAL | Status: DC

## 2024-01-28 NOTE — ED Provider Notes (Signed)
 [facility] EMERGENCY DEPARTMENT AT  HOSPITAL Provider Note   CSN: [id number] Arrival date & time: 01/28/24  1523     History {Add pertinent medical, surgical, social history, OB history to HPI:1} Chief Complaint  Patient presents with   Chest Pain   Shortness of Breath    [name] is a 63 y.o. female.  HPI   63 year old female with past medical history of HTN, HLD, CAD/CABG presents to the emergency department with heaviness in her chest.  This has been ongoing for the past 3 weeks, recently attributed to anemia, she has refused transfusions.  She had brief improvement of symptoms but presents with ongoing chest heaviness.  She was seen by cardiology last week and recommended to come to the ER.  She left before checking in due to wait time and presents today with ongoing chest heaviness.  She also complains   Home Medications Prior to Admission medications   Medication Sig Start Date End Date Taking? Authorizing Provider  albuterol  (PROVENTIL  HFA;VENTOLIN  HFA) 108 (90 BASE) MCG/ACT inhaler Inhale 2 puffs into the lungs every 6 (six) hours as needed for wheezing or shortness of breath.     [provider]  Alirocumab  (PRALUENT ) 150 MG/ML SOAJ Inject 1 mL (150 mg total) into the skin every 14 (fourteen) days. 07/10/23   Tania Familia, NP  amLODipine  (NORVASC ) 5 MG tablet Take 1 tablet (5 mg total) by mouth daily. 01/24/24   Avanell Leigh, MD  aspirin  EC 81 MG tablet Take 1 tablet (81 mg total) by mouth daily. Swallow whole. 01/08/24 01/07/25  Vann, Jessica U, DO  atorvastatin  (LIPITOR ) 80 MG tablet Take 1 tablet (80 mg total) by mouth daily. 01/24/24   Avanell Leigh, MD  brimonidine  (ALPHAGAN ) 0.15 % ophthalmic solution Place 1 drop into both eyes 2 (two) times daily.     [provider]  Budeson-Glycopyrrol-Formoterol  (BREZTRI  AEROSPHERE) 160-9-4.8 MCG/ACT AERO Inhale 2 puffs into the lungs in the morning and at bedtime. 04/30/23   Cobb,  Mariah Shines, NP  clopidogrel  (PLAVIX ) 75 MG tablet Take 1 tablet (75 mg total) by mouth daily. 01/11/24 01/10/25  Enrigue Harvard, DO  escitalopram  (LEXAPRO ) 20 MG tablet Take 20 mg by mouth daily. 05/31/19   [provider]  fluticasone  (FLONASE ) 50 MCG/ACT nasal spray Place 2 sprays into both nostrils daily. Patient taking differently: Place 2 sprays into both nostrils daily as needed for allergies. 05/30/23   Soldatova, Liuba, MD  icosapent  Ethyl (VASCEPA ) 1 g capsule TAKE 2 CAPSULES BY MOUTH 2 TIMES DAILY. 09/03/23   Avanell Leigh, MD  isosorbide  mononitrate (IMDUR ) 30 MG 24 hr tablet Take 3 tablets (90 mg total) by mouth daily. 01/24/24   Avanell Leigh, MD  lamoTRIgine  (LAMICTAL ) 100 MG tablet Take 100 mg by mouth daily. 08/05/19   [provider]  metoprolol  tartrate (LOPRESSOR ) 25 MG tablet Take 0.5 tablets (12.5 mg total) by mouth 2 (two) times daily. 01/24/24   Avanell Leigh, MD  niacin  (NIASPAN ) 1000 MG CR tablet TAKE 1 TABLET (1,000 MG TOTAL) BY MOUTH AT BEDTIME. 02/21/23   Avanell Leigh, MD  nitroGLYCERIN  (NITROSTAT ) 0.4 MG SL tablet Place 1 tablet (0.4 mg total) under the tongue every 5 (five) minutes as needed for chest pain. 01/24/24   Avanell Leigh, MD  ondansetron  (ZOFRAN -ODT) 4 MG disintegrating tablet Take 1 tablet (4 mg total) by mouth every 8 (eight) hours as needed for nausea or vomiting. 01/20/24  Dorenda Gandy, MD  pantoprazole  (PROTONIX ) 40 MG tablet Take 1 tablet (40 mg total) by mouth 2 (two) times daily. 01/08/24   Vann, Jessica U, DO  QUEtiapine  Fumarate 150 MG TABS Take 150 mg by mouth at bedtime.    [provider]  ranolazine  (RANEXA ) 500 MG 12 hr tablet TAKE 1 TABLET BY MOUTH TWICE A DAY Patient taking differently: Take 1,000 mg by mouth 2 (two) times daily. 1 gram twice daily 09/15/22   Avanell Leigh, MD  sucralfate  (CARAFATE ) 1 g tablet Carafate  1 g 4 times daily x 2 weeks and  then twice daily for 1 month. Then may stop.  01/08/24   Vann, Jessica U, DO  VITAMIN E PO Take 1 tablet by mouth daily.    [provider]      Allergies    Benadryl  [diphenhydramine  hcl], Codeine, Haldol [haloperidol decanoate], Amoxicillin, and Bee venom    Review of Systems   Review of Systems  Constitutional:  Positive for fatigue. Negative for fever.  Respiratory:  Positive for chest tightness. Negative for shortness of breath.   Cardiovascular:  Negative for chest pain and leg swelling.  Gastrointestinal:  Negative for abdominal pain, diarrhea and vomiting.  Skin:  Negative for rash.  Neurological:  Negative for headaches.    Physical Exam Updated Vital Signs BP (!) 136/93   Pulse (!) 50   Temp (!) 97.5 F (36.4 C) (Oral)   Resp (!) 9   Ht 5' 5.5" (1.664 m)   Wt 89 kg   LMP 11/16/2011   SpO2 100%   BMI 32.15 kg/m  Physical Exam Vitals and nursing note reviewed.  Constitutional:      Appearance: Normal appearance.  HENT:     Head: Normocephalic.     Mouth/Throat:     Mouth: Mucous membranes are moist.  Cardiovascular:     Rate and Rhythm: Normal rate.  Pulmonary:     Effort: Pulmonary effort is normal. No respiratory distress.  Abdominal:     Palpations: Abdomen is soft.     Tenderness: There is no abdominal tenderness.  Musculoskeletal:     Right lower leg: No edema.     Left lower leg: No edema.  Skin:    General: Skin is warm.  Neurological:     Mental Status: She is alert and oriented to person, place, and time. Mental status is at baseline.  Psychiatric:        Mood and Affect: Mood normal.     ED Results / Procedures / Treatments   Labs (all labs ordered are listed, but only abnormal results are displayed) Labs Reviewed  BASIC METABOLIC PANEL WITH GFR - Abnormal; Notable for the following components:      Result Value   Glucose, Bld 122 (*)    Creatinine, Ser 1.29 (*)    Calcium  8.7 (*)    GFR, Estimated 47 (*)    All other components within normal limits  CBC - Abnormal;  Notable for the following components:   WBC 11.3 (*)    RBC 3.70 (*)    Hemoglobin 10.5 (*)    HCT 34.0 (*)    RDW 16.2 (*)    Platelets 464 (*)    All other components within normal limits  TROPONIN I (HIGH SENSITIVITY)  TROPONIN I (HIGH SENSITIVITY)    EKG None  Radiology CT Angio Chest PE W/Cm &/Or Wo Cm Result Date: 01/28/2024 CLINICAL DATA:  Pulmonary embolism (PE) suspected, high prob EXAM:  CT ANGIOGRAPHY CHEST WITH CONTRAST TECHNIQUE: Multidetector CT imaging of the chest was performed using the standard protocol during bolus administration of intravenous contrast. Multiplanar CT image reconstructions and MIPs were obtained to evaluate the vascular anatomy. RADIATION DOSE REDUCTION: This exam was performed according to the departmental dose-optimization program which includes automated exposure control, adjustment of the mA and/or kV according to patient size and/or use of iterative reconstruction technique. CONTRAST:  75mL OMNIPAQUE  IOHEXOL  350 MG/ML SOLN COMPARISON:  01/15/2024. FINDINGS: Cardiovascular: Satisfactory opacification of the pulmonary arteries to the segmental level. No evidence of pulmonary embolism. Nonaneurysmal thoracic aorta. Moderate aortic atherosclerosis. Prior CABG. Coronary artery calcification. Normal heart size. No pericardial effusion. Mediastinum/Nodes: No enlarged mediastinal, hilar, or axillary lymph nodes. Thyroid  gland, trachea, and esophagus demonstrate no significant findings. Lungs/Pleura: Lungs are clear. No pleural effusion or pneumothorax. Upper Abdomen: Reflux of contrast into the hepatic veins, which can be seen in the setting of right heart dysfunction. Status post cholecystectomy. Musculoskeletal: Chronic discontinuity is again noted throughout the sternotomy wires. No acute osseous abnormality. No suspicious osseous lesion. Review of the MIP images confirms the above findings. IMPRESSION: 1. No evidence of pulmonary embolism. 2. Reflux of contrast  into the hepatic veins, which can be seen in the setting of right heart dysfunction. 3. No acute intrathoracic findings. 4. Aortic Atherosclerosis (ICD10-I70.0). Electronically Signed   By: Mannie Seek M.D.   On: 01/28/2024 19:09   DG Chest 1 View Result Date: 01/28/2024 CLINICAL DATA:  Shortness of breath, chest pain EXAM: CHEST  1 VIEW COMPARISON:  01/20/2024 FINDINGS: Prior CABG. Heart and mediastinal contours within normal limits. Aortic atherosclerosis. No confluent airspace opacities, effusions or edema. No acute bony abnormality. IMPRESSION: No active disease. Electronically Signed   By: Janeece Mechanic M.D.   On: 01/28/2024 17:17    Procedures Procedures  {Document cardiac monitor, telemetry assessment procedure when appropriate:1}  Medications Ordered in ED Medications  iohexol  (OMNIPAQUE ) 350 MG/ML injection 75 mL (75 mLs Intravenous Contrast Given 01/28/24 1818)    ED Course/ Medical Decision Making/ A&P   {   Click here for ABCD2, HEART and other calculatorsREFRESH Note before signing :1}                              Medical Decision Making  ***  {Document critical care time when appropriate:1} {Document review of labs and clinical decision tools ie heart score, Chads2Vasc2 etc:1}  {Document your independent review of radiology images, and any outside records:1} {Document your discussion with family members, caretakers, and with consultants:1} {Document social determinants of health affecting pt's care:1} {Document your decision making why or why not admission, treatments were needed:1} Final Clinical Impression(s) / ED Diagnoses Final diagnoses:  None    Rx / DC Orders ED Discharge Orders     None

## 2024-01-28 NOTE — ED Notes (Signed)
 Pt c/o feeling "a little dizzy" after ambulating to the restroom with assistance. Pt provided with bedside toilet for future use.

## 2024-01-28 NOTE — ED Notes (Signed)
 Horton, DO notified of bradycardia.

## 2024-01-28 NOTE — ED Triage Notes (Signed)
 Pt reports 2 weeks of SHOB with increasing SHOB and CP x3 days. Significant cardiac hx. SHOB worsens with exertion,.

## 2024-01-28 NOTE — ED Provider Triage Note (Addendum)
 Emergency Medicine Provider Triage Evaluation Note  [name] , a 63 y.o. female  was evaluated in triage.  Pt complains of CP, SHOB. Has been going for transfusions regularly (for anemia from GI bleed). CP with exertion, heaviness in arms, headaches, heart pounding. Rested all weekend, called her cardiologist who told to go to the ER. Currently feeling sweaty, no CP currently, only occurs with rest. SHOB with exertion.   Review of Systems  Positive:  Negative:   Physical Exam  BP (!) 155/97 (BP Location: Right Arm)   Pulse 61   Temp 98.4 F (36.9 C)   Resp (!) 21   Ht 5' 5.5" (1.664 m)   Wt 89 kg   LMP 11/16/2011   SpO2 99%   BMI 32.15 kg/m  Gen:   Awake, no distress   Resp:  Normal effort  MSK:   Moves extremities without difficulty  Other:    Medical Decision Making  Medically screening exam initiated at 3:37 PM.  Appropriate orders placed.  IZZY DOUBEK was informed that the remainder of the evaluation will be completed by another provider, this initial triage assessment does not replace that evaluation, and the importance of remaining in the ED until their evaluation is complete.     Darlis Eisenmenger, PA-C 01/28/24 1538    Darlis Eisenmenger, PA-C 01/28/24 1540

## 2024-01-28 NOTE — Telephone Encounter (Addendum)
 Patient called stating she was planning on going to the emergency roomroom today and wanted to let [facility] know.  She said she will wait to hear back before she goes.  Advised [name] is not in the office today, and to go to the emergency room as she was told to do on 01/25/24

## 2024-01-28 NOTE — Telephone Encounter (Signed)
 Returned call to pt- advised pt to return to emergency room as previously directed. Patient states she will head over there in just a little bit, but needs to get her cats taken care of first.

## 2024-01-29 DIAGNOSIS — I25119 Atherosclerotic heart disease of native coronary artery with unspecified angina pectoris: Secondary | ICD-10-CM | POA: Diagnosis not present

## 2024-01-29 DIAGNOSIS — E785 Hyperlipidemia, unspecified: Secondary | ICD-10-CM | POA: Diagnosis not present

## 2024-01-29 DIAGNOSIS — I10 Essential (primary) hypertension: Secondary | ICD-10-CM | POA: Diagnosis not present

## 2024-01-29 DIAGNOSIS — R0789 Other chest pain: Secondary | ICD-10-CM | POA: Diagnosis not present

## 2024-01-29 MED ORDER — ISOSORBIDE MONONITRATE ER 30 MG PO TB24: 120.0000 mg | ORAL_TABLET | Freq: Every day | ORAL | Status: DC

## 2024-01-29 NOTE — Significant Event (Signed)
 I was paged that the patient is interested in leaving the hospital AMA.  I presented to the bedside to discuss the patient's wishes.  She expressed that she has a nephew at home who needs her urgent assistance and that she would like to go home to help care for him.  I expressed that my preference would be for her to remain in the hospital overnight as is described in my H&P, but that I do understand her concerns regarding her family member.  I further expressed that leaving the hospital prematurely can result in worse outcomes including death.  Despite these risks the patient still wishes to leave the hospital at this time.  She is currently chest pain-free and hemodynamically stable.  She plans to call her primary cardiology office in the morning.  I will defer initiating any new medication changes at this time given her decision.  Dr. April Bayard T. Pasco Bond, MD

## 2024-01-29 NOTE — H&P (Signed)
 Cardiology Admission History and Physical   Patient ID: MARYKATHLEEN RUSSI MRN: [id number]; DOB: December 10, 1960   Admission date: 01/28/2024  PCP:  Claudell Cruz, MD   [facility] Providers Cardiologist:  Lauro Portal, MD       Chief Complaint: Chest pain  Patient Profile: [name] is a 63 y.o. female with multivessel CAD s/p 3V CABG (LIMA-LAD, SVG-ramus, SVG-PDA, 06/2007), HTN, HLD, COPD, tobacco use disorder, OSA, bipolar 1 disorder, and gastric ulcers c/b prior GIB who is being seen 01/29/2024 for the evaluation of chest pain.  History of Present Illness: Ms. Layson was seen in cardiology clinic 01/25/2024 for follow-up.  During that visit she reported having 3 weeks of worsening SOB and chest pain.  She describes having pressure-like chest pain in the left side of her chest with associated palpitations, diaphoresis, and heaviness in her LUE.  Notably, the symptoms only occur with exertion and she states that she feels totally fine at rest.  Over the last few weeks she is needed increasing doses of SLNGTs which do relieve her pain.  She would typically wait 5 to 8 minutes after she stops exerting herself before taking a nitroglycerin  tablet.  Given her symptoms she was instructed to present to the ED on 6/6; however, she elected to leave the ED prior to being evaluated due to the wait time.  She subsequently presented to the ED today instead.  Currently she says that today is the best that she is felt in the past month.  She has had minimal to no chest discomfort and has not required any SLNGT today and only required 1 yesterday.  She denies syncope, presyncope, PND, orthopnea, swelling, or new bleeding.  She has no further complaints.    She currently smokes occasionally, but used to smoke 3 packs/day.  No alcohol or illicit drug use.  She underwent her last LHC on 03/01/2020 which showed her known severe CAD with a patent LIMA and SVG to ramus, but occluded SVG to PDA.   Ultimately was decided that medical management would be best given the complexity of her CAD.  In the ED her VS were afebrile, HR 61, BP 155/97, RR 21, and satting 99% on RA.  Labs were notable for WBC 11.3, hemoglobin 10.5, and creatinine 1.29.  Troponins were negative x 2.  CT PE was negative for PE or pulmonary infiltrates.  ECG showed NSR with evidence of prior infarct in the inferior and anterior lateral leads.  She is currently chest pain-free.   Past Medical History:  Diagnosis Date   Allergy    Anemia    takes iron supplement   Arthritis    knees, shoulders   Asthma    Bipolar 1 disorder (HCC)    Blood transfusion without reported diagnosis    Cataract, immature    CHF (congestive heart failure) (HCC)    Clotting disorder (HCC)    she denied any specific genetic bleeding/clotting diagnosis (10/05/23)   COPD (chronic obstructive pulmonary disease) (HCC)    Coronary artery disease    triple vessel   Depression    Dyspnea    Family history of adverse reaction to anesthesia    states father "did not do well" with anesthesia; died last time he had surgery; also had heart disease   Gallstones    GERD (gastroesophageal reflux disease)    occasional - no current med.   Glaucoma    Hammertoe 05/2015   right second toe   Heart  murmur    Hepatic steatosis    High cholesterol    History of MI (myocardial infarction)    Hypertension    states under control with meds., has been on med. x 8 yr.   Myocardial infarction (HCC)    Osteoporosis    Panic attacks    Peripheral vascular disease (HCC)    carotid   Renal cyst, left    Schizophrenia (HCC)    Short of breath on exertion    Sleep apnea    Inspire in future   Urinary, incontinence, stress female    Wears dentures    upper   Wears partial dentures    lower   Past Surgical History:  Procedure Laterality Date   C-SECTIONS X 2  1985, 1988   CARDIAC CATHETERIZATION  12/10/2006; 12/18/2006; 07/10/2007; 07/19/2009    CARDIAC CATHETERIZATION  03/04/2010   patent grafts, normal LV function, diffusely diseased RCA (Dr. Aleda Ammon)   CARDIAC CATHETERIZATION N/A 02/15/2016   Procedure: Left Heart Cath and Cors/Grafts Angiography;  Surgeon: Peter M Swaziland, MD;  Location: [facility] INVASIVE CV LAB;  Service: Cardiovascular;  Laterality: N/A;   CHOLECYSTECTOMY N/A 06/29/2023   Procedure: LAPAROSCOPIC CHOLECYSTECTOMY WITH ICG DYE;  Surgeon: Aldean Hummingbird, MD;  Location: [facility] ORS;  Service: General;  Laterality: N/A;   CLUB FOOT RELEASE Bilateral    1st and 2nd toes   COLONOSCOPY     CORONARY ARTERY BYPASS GRAFT  07/16/2007   LIMA to LAD, VG to ramus, VG to PDA    CORONARY STENT INTERVENTION N/A 06/23/2019   Procedure: CORONARY STENT INTERVENTION;  Surgeon: Avanell Leigh, MD;  Location: [facility] INVASIVE CV LAB;  Service: Cardiovascular;  Laterality: N/A;   DRUG INDUCED ENDOSCOPY N/A 05/17/2023   Procedure: DRUG INDUCED ENDOSCOPY;  Surgeon: Lind Repine, MD;  Location[facility] ENDOSCOPY;  Service: Pulmonary;  Laterality: N/A;   ESOPHAGOGASTRODUODENOSCOPY N/A 01/07/2024   Procedure: EGD (ESOPHAGOGASTRODUODENOSCOPY);  Surgeon: Normie Becton., MD;  Location: [facility];  Service: Gastroenterology;  Laterality: N/A;   FLEXIBLE BRONCHOSCOPY  07/16/2007   HAMMERTOE RECONSTRUCTION WITH WEIL OSTEOTOMY Right 06/03/2015   Procedure: RIGHT SECOND HAMMERTOE CORRECTION WITH MT WEIL OSTEOTOMY;  Surgeon: Amada Backer, MD;  Location: [facility];  Service: Orthopedics;  Laterality: Right;   HAND SURGERY Bilateral    1st and 2nd fingers - release of clubbing   IMPLANTATION OF HYPOGLOSSAL NERVE STIMULATOR N/A 10/10/2023   Procedure: IMPLANTATION OF HYPOGLOSSAL NERVE STIMULATOR;  Surgeon: Artice Last, MD;  Location: [facility] OR;  Service: ENT;  Laterality: N/A;  right   KNEE ARTHROSCOPY WITH ANTERIOR CRUCIATE LIGAMENT (ACL) REPAIR Right 01/30/2008   LEFT HEART CATH AND CORS/GRAFTS ANGIOGRAPHY N/A 06/23/2019   Procedure: LEFT  HEART CATH AND CORS/GRAFTS ANGIOGRAPHY;  Surgeon: Avanell Leigh, MD;  Location: [facility] INVASIVE CV LAB;  Service: Cardiovascular;  Laterality: N/A;   LEFT HEART CATH AND CORS/GRAFTS ANGIOGRAPHY N/A 03/01/2020   Procedure: LEFT HEART CATH AND CORS/GRAFTS ANGIOGRAPHY;  Surgeon: Arty Binning, MD;  Location: [facility] INVASIVE CV LAB;  Service: Cardiovascular;  Laterality: N/A;   NOVASURE ABLATION  12/19/2007   with removal of IUD   ORIF FOOT FRACTURE Left    TOE SURGERIES TO REMOVE NAILS DUE TO CLUBBING     TOTAL KNEE ARTHROPLASTY Right 10/21/2012   Procedure: TOTAL KNEE ARTHROPLASTY;  Surgeon: Bevin Bucks, MD;  Location: [facility] ORS;  Service: Orthopedics;  Laterality: Right;   UPPER GASTROINTESTINAL ENDOSCOPY       Medications Prior  to Admission: Prior to Admission medications   Medication Sig Start Date End Date Taking? Authorizing Provider  albuterol  (PROVENTIL  HFA;VENTOLIN  HFA) 108 (90 BASE) MCG/ACT inhaler Inhale 2 puffs into the lungs every 6 (six) hours as needed for wheezing or shortness of breath.    Yes [provider]  Alirocumab  (PRALUENT ) 150 MG/ML SOAJ Inject 1 mL (150 mg total) into the skin every 14 (fourteen) days. 07/10/23  Yes Tania Familia, NP  amLODipine  (NORVASC ) 5 MG tablet Take 1 tablet (5 mg total) by mouth daily. 01/24/24  Yes Avanell Leigh, MD  aspirin  EC 81 MG tablet Take 1 tablet (81 mg total) by mouth daily. Swallow whole. 01/08/24 01/07/25 Yes Vann, Jessica U, DO  atorvastatin  (LIPITOR ) 80 MG tablet Take 1 tablet (80 mg total) by mouth daily. 01/24/24  Yes Avanell Leigh, MD  brimonidine  (ALPHAGAN ) 0.15 % ophthalmic solution Place 1 drop into both eyes 2 (two) times daily.    Yes [provider]  Budeson-Glycopyrrol-Formoterol  (BREZTRI  AEROSPHERE) 160-9-4.8 MCG/ACT AERO Inhale 2 puffs into the lungs in the morning and at bedtime. 04/30/23  Yes Cobb, Mariah Shines, NP  clopidogrel  (PLAVIX ) 75 MG tablet Take 1 tablet (75 mg total) by mouth daily. 01/11/24  01/10/25 Yes Vann, Jessica U, DO  escitalopram  (LEXAPRO ) 20 MG tablet Take 20 mg by mouth daily. 05/31/19  Yes [provider]  fluticasone  (FLONASE ) 50 MCG/ACT nasal spray Place 2 sprays into both nostrils daily. Patient taking differently: Place 2 sprays into both nostrils daily as needed for allergies. 05/30/23  Yes Soldatova, Liuba, MD  icosapent  Ethyl (VASCEPA ) 1 g capsule TAKE 2 CAPSULES BY MOUTH 2 TIMES DAILY. 09/03/23  Yes Avanell Leigh, MD  isosorbide  mononitrate (IMDUR ) 30 MG 24 hr tablet Take 3 tablets (90 mg total) by mouth daily. 01/24/24  Yes Avanell Leigh, MD  lamoTRIgine  (LAMICTAL ) 100 MG tablet Take 100 mg by mouth daily. 08/05/19  Yes [provider]  metoprolol  tartrate (LOPRESSOR ) 25 MG tablet Take 0.5 tablets (12.5 mg total) by mouth 2 (two) times daily. 01/24/24  Yes Avanell Leigh, MD  niacin  (NIASPAN ) 1000 MG CR tablet TAKE 1 TABLET (1,000 MG TOTAL) BY MOUTH AT BEDTIME. 02/21/23  Yes Avanell Leigh, MD  nitroGLYCERIN  (NITROSTAT ) 0.4 MG SL tablet Place 1 tablet (0.4 mg total) under the tongue every 5 (five) minutes as needed for chest pain. 01/24/24  Yes Avanell Leigh, MD  ondansetron  (ZOFRAN -ODT) 4 MG disintegrating tablet Take 1 tablet (4 mg total) by mouth every 8 (eight) hours as needed for nausea or vomiting. 01/20/24  Yes Dorenda Gandy, MD  pantoprazole  (PROTONIX ) 40 MG tablet Take 1 tablet (40 mg total) by mouth 2 (two) times daily. 01/08/24  Yes Vann, Jessica U, DO  QUEtiapine  Fumarate 150 MG TABS Take 150 mg by mouth at bedtime.   Yes [provider]  ranolazine  (RANEXA ) 500 MG 12 hr tablet TAKE 1 TABLET BY MOUTH TWICE A DAY Patient taking differently: Take 1,000 mg by mouth 2 (two) times daily. 1 gram twice daily 09/15/22  Yes Avanell Leigh, MD  sucralfate  (CARAFATE ) 1 g tablet Carafate  1 g 4 times daily x 2 weeks and  then twice daily for 1 month. Then may stop. 01/08/24  Yes Vann, Jessica U, DO  VITAMIN E PO Take 1 tablet by mouth  daily.   Yes [provider]     Allergies:    Allergies  Allergen Reactions   Benadryl  [Diphenhydramine  Hcl] Shortness  Of Breath and Swelling    Tongue swelling   Codeine Other (See Comments)    "makes me psychotic"    Haldol [Haloperidol Decanoate] Anaphylaxis   Amoxicillin Hives and Swelling    Swelling to extremities Did it involve swelling of the face/tongue/throat, SOB, or low BP? Yes Did it involve sudden or severe rash/hives, skin peeling, or any reaction on the inside of your mouth or nose? Yes Did you need to seek medical attention at a hospital or doctor's office? Yes When did it last happen?    young adult   If all above answers are "NO", may proceed with cephalosporin use.   Bee Venom Swelling and Other (See Comments)    "pimples" from head to toe and whole body swelling    Social History:   Social History   Socioeconomic History   Marital status: Divorced    Spouse name: Not on file   Number of children: Not on file   Years of education: Not on file   Highest education level: Not on file  Occupational History   Not on file  Tobacco Use   Smoking status: Some Days    Current packs/day: 2.00    Average packs/day: 2.0 packs/day for 38.4 years (76.9 ttl pk-yrs)    Types: Cigarettes    Start date: 08/21/1985   Smokeless tobacco: Never   Tobacco comments:    10/23/2023 patient smoke a pack of cigarettes a month  Vaping Use   Vaping status: Never Used  Substance and Sexual Activity   Alcohol use: No   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   Sexual activity: Not on file  Other Topics Concern   Not on file  Social History Narrative   Not on file   Social Drivers of Health   Financial Resource Strain: Low Risk  (01/15/2024)   Received from [facility]   Overall Financial Resource Strain (CARDIA)    Difficulty of Paying Living Expenses: Not very hard  Food Insecurity: Food Insecurity Present (01/15/2024)   Received from [facility]    Hunger Vital Sign    Worried About Running Out of Food in the Last Year: Never true    Ran Out of Food in the Last Year: Sometimes true  Transportation Needs: Unmet Transportation Needs (01/15/2024)   Received from [facility]   PRAPARE - Transportation    Lack of Transportation (Medical): Yes    Lack of Transportation (Non-Medical): No  Physical Activity: Insufficiently Active (01/15/2024)   Received from [facility]   Exercise Vital Sign    Days of Exercise per Week: 1 day    Minutes of Exercise per Session: 30 min  Stress: Stress Concern Present (01/15/2024)   Received from [facility] - Occupational Stress Questionnaire    Feeling of Stress : To some extent  Social Connections: Somewhat Isolated (01/15/2024)   Received from [facility]   Social Network    How would you rate your social network (family, work, friends)?: Restricted participation with some degree of social isolation  Intimate Partner Violence: Not At Risk (01/15/2024)   Received from [facility]   HITS    Over the last 12 months how often did your partner physically hurt you?: Never    Over the last 12 months how often did your partner insult you or talk down to you?: Never    Over the last 12 months how often  did your partner threaten you with physical harm?: Never    Over the last 12 months how often did your partner scream or curse at you?: Never     Family History:   The patient's family history includes Anesthesia problems in her father; Breast cancer in her maternal aunt and maternal aunt; Colon polyps in her sister; Heart disease in her father; Kidney cancer in her mother; Lung cancer in her mother. There is no history of Stomach cancer, Esophageal cancer, Pancreatic cancer, Rectal cancer, or Colon cancer.    ROS:  Please see the history of present illness.  All other ROS reviewed and negative.     Physical Exam/Data: Vitals:   01/28/24 2100 01/28/24  2101 01/28/24 2230 01/28/24 2300  BP: (!) 136/93  135/78 (!) 142/82  Pulse: (!) 50  (!) 52 (!) 48  Resp: (!) 9  17 19   Temp:  (!) 97.5 F (36.4 C)    TempSrc:  Oral    SpO2: 100%  99% 100%  Weight:      Height:       No intake or output data in the 24 hours ending 01/29/24 0006    01/28/2024    3:35 PM 01/25/2024    2:57 PM 01/20/2024    4:22 PM  Last 3 Weights  Weight (lbs) 196 lb 3.4 oz 197 lb 187 lb  Weight (kg) 89 kg 89.359 kg 84.823 kg     Body mass index is 32.15 kg/m.  General:  Well nourished, well developed, in no acute distress, very pleasant HEENT: normal, atraumatic, normocephalic Neck: no JVD Vascular: No carotid bruits; radial pulses 2+ bilaterally   Cardiac:  normal S1, S2; RRR; no murmur, rubs or gallops Lungs:  clear to auscultation bilaterally, no wheezing, rhonchi or rales  Abd: soft, nontender, no hepatomegaly  Ext: no edema Musculoskeletal:  No deformities, grossly normal Skin: warm and dry  Neuro: Grossly normal Psych:  Normal affect   EKG:  The ECG that was done was personally reviewed and demonstrates NSR with prior infarcts in the inferior and anterior lateral leads    Relevant CV Studies:   TTE 07/01/23:  IMPRESSIONS     1. Left ventricular ejection fraction, by estimation, is 50 to 55%. The  left ventricle has low normal function. The left ventricle demonstrates  regional wall motion abnormalities (see scoring diagram/findings for  description). Left ventricular diastolic   parameters were normal.   2. Right ventricular systolic function is normal. The right ventricular  size is normal. Tricuspid regurgitation signal is inadequate for assessing  PA pressure.   3. Left atrial size was moderately dilated.   4. The mitral valve is normal in structure. No evidence of mitral valve  regurgitation. No evidence of mitral stenosis.   5. The aortic valve is tricuspid. Aortic valve regurgitation is not  visualized. Aortic valve sclerosis is  present, with no evidence of aortic  valve stenosis.   6. The inferior vena cava is normal in size with greater than 50%  respiratory variability, suggesting right atrial pressure of 3 mmHg.   LHC 03/01/20:  Patent left internal mammary graft to the LAD. Diffusely diseased but patent saphenous vein graft to the ramus intermedius. Total occlusion of the mid and distal segments of the saphenous vein graft to the PDA.  PDA and other left ventricular branches of the right coronary regular supplied by left to right collaterals. Patent but diffusely diseased native left main. Functional occlusion of the left anterior  descending after the origin of the first diagonal.  The segment from the ostial LAD into the diagonal is diffusely diseased up to 60 to 70%. Patent circumflex artery that includes a single obtuse marginal.  Ostial to mid vessel luminal irregularities with eccentric stenoses up to 60 to 70%.  Unchanged from prior imaging. Inferobasal akinesis.  EF 40%.  LVEDP 14 mmHg on IV nitroglycerin    RECOMMENDATIONS:   Continue aggressive risk factor modification. Add long-acting nitrates to improve collateral flow.  IV nitroglycerin  is discontinued. Subcutaneous heparin  DVT prophylaxis. Continue dual antiplatelet therapy but consider clopidogrel  monotherapy after 3 months.  Laboratory Data: High Sensitivity Troponin:   Recent Labs  Lab 01/20/24 1546 01/20/24 1732 01/28/24 1543 01/28/24 2109  TROPONINIHS 12 12 10 10       Chemistry Recent Labs  Lab 01/28/24 1543  NA 139  K 4.4  CL 105  CO2 24  GLUCOSE 122*  BUN 17  CREATININE 1.29*  CALCIUM  8.7*  GFRNONAA 47*  ANIONGAP 10    No results for input(s): "PROT", "ALBUMIN ", "AST", "ALT", "ALKPHOS", "BILITOT" in the last 168 hours. Lipids No results for input(s): "CHOL", "TRIG", "HDL", "LABVLDL", "LDLCALC", "CHOLHDL" in the last 168 hours. Hematology Recent Labs  Lab 01/28/24 1543  WBC 11.3*  RBC 3.70*  HGB 10.5*  HCT 34.0*   MCV 91.9  MCH 28.4  MCHC 30.9  RDW 16.2*  PLT 464*   Thyroid  No results for input(s): "TSH", "FREET4" in the last 168 hours. BNPNo results for input(s): "BNP", "PROBNP" in the last 168 hours.  DDimer No results for input(s): "DDIMER" in the last 168 hours.  Radiology/Studies:  CT Angio Chest PE W/Cm &/Or Wo Cm Result Date: 01/28/2024 CLINICAL DATA:  Pulmonary embolism (PE) suspected, high prob EXAM: CT ANGIOGRAPHY CHEST WITH CONTRAST TECHNIQUE: Multidetector CT imaging of the chest was performed using the standard protocol during bolus administration of intravenous contrast. Multiplanar CT image reconstructions and MIPs were obtained to evaluate the vascular anatomy. RADIATION DOSE REDUCTION: This exam was performed according to the departmental dose-optimization program which includes automated exposure control, adjustment of the mA and/or kV according to patient size and/or use of iterative reconstruction technique. CONTRAST:  75mL OMNIPAQUE  IOHEXOL  350 MG/ML SOLN COMPARISON:  01/15/2024. FINDINGS: Cardiovascular: Satisfactory opacification of the pulmonary arteries to the segmental level. No evidence of pulmonary embolism. Nonaneurysmal thoracic aorta. Moderate aortic atherosclerosis. Prior CABG. Coronary artery calcification. Normal heart size. No pericardial effusion. Mediastinum/Nodes: No enlarged mediastinal, hilar, or axillary lymph nodes. Thyroid  gland, trachea, and esophagus demonstrate no significant findings. Lungs/Pleura: Lungs are clear. No pleural effusion or pneumothorax. Upper Abdomen: Reflux of contrast into the hepatic veins, which can be seen in the setting of right heart dysfunction. Status post cholecystectomy. Musculoskeletal: Chronic discontinuity is again noted throughout the sternotomy wires. No acute osseous abnormality. No suspicious osseous lesion. Review of the MIP images confirms the above findings. IMPRESSION: 1. No evidence of pulmonary embolism. 2. Reflux of contrast  into the hepatic veins, which can be seen in the setting of right heart dysfunction. 3. No acute intrathoracic findings. 4. Aortic Atherosclerosis (ICD10-I70.0). Electronically Signed   By: Mannie Seek M.D.   On: 01/28/2024 19:09   DG Chest 1 View Result Date: 01/28/2024 CLINICAL DATA:  Shortness of breath, chest pain EXAM: CHEST  1 VIEW COMPARISON:  01/20/2024 FINDINGS: Prior CABG. Heart and mediastinal contours within normal limits. Aortic atherosclerosis. No confluent airspace opacities, effusions or edema. No acute bony abnormality. IMPRESSION: No active disease. Electronically Signed  By: Janeece Mechanic M.D.   On: 01/28/2024 17:17     Assessment and Plan: CORIN FORMISANO is a 63 y.o. female with multivessel CAD s/p 3V CABG (LIMA-LAD, SVG-ramus, SVG-PDA, 06/2007), HTN, HLD, COPD, tobacco use disorder, OSA, bipolar 1 disorder, and gastric ulcers c/b prior GIB who is being seen 01/29/2024 for the evaluation of chest pain.  #Stable Angina #Severe Multivessel Chronic CAD s/p CABG (06/2007) :: Patient presented 3 days after being instructed to come to the ED for what at the time sounded like accelerated/unstable angina.  Today, her symptoms sound much more stable and only occur with exertion.  Troponins and ECG have ruled out for ACS.  Unfortunately this patient has very bad CAD and so it is not surprising that she has ongoing anginal symptoms.  Per her last coronary angiogram, medical management is preferred at this time.  For angina, she currently takes metoprolol  tartrate, amlodipine , ranolazine , and Imdur .  She has room to go up on both her Imdur  or amlodipine .  I will increase her Imdur  to 120 mg daily as an initial step to hopefully improve her anginal symptoms. Given that this is the first day she has felt well, I would observe her overnight to ensure stability with hopeful discharge tomorrow if she continues to feel okay. - Increase Imdur  to 120 mg daily (previous 90 mg daily) - Continue  metoprolol  tartrate 12.5 mg twice daily - Continue amlodipine  5 mg daily - Continue ranolazine  1000 mg twice daily - Continue aspirin  and Plavix  chronically - Lipid panel  #HTN #HLD - Check lipid panel - Continue atorvastatin  80 mg daily - Hold [id number] while inpatient - Continue icosapent  ethyl 2 g twice daily - Continue amlodipine  5 mg daily - The metoprolol   #Bipolar 1 Disorder - Continue Seroquel  150 mg nightly - Continue Lamictal  100 mg daily  #COPD - Continue Breztri  2 puffs twice daily - As needed albuterol   #Recent GIB - Continue sucralfate  and PPI   Risk Assessment/Risk Scores:        Code Status: Full Code  Severity of Illness: The appropriate patient status for this patient is OBSERVATION. Observation status is judged to be reasonable and necessary in order to provide the required intensity of service to ensure the patient's safety. The patient's presenting symptoms, physical exam findings, and initial radiographic and laboratory data in the context of their medical condition is felt to place them at decreased risk for further clinical deterioration. Furthermore, it is anticipated that the patient will be medically stable for discharge from the hospital within 2 midnights of admission.   For questions or updates, please contact [facility] Please consult [email or web address] for contact info under     Signed, Christena Covert, MD  01/29/2024 12:06 AM

## 2024-01-29 NOTE — ED Notes (Signed)
 Pt states that she is having issues with animal care at home. She states that the person she had to take care of them was overwhelmed and that she needed to leave. MD L. Vira Grieves at bedside to discuss AMA with pt. Pt is alert and oriented, follows commands, and understands the risks of leaving AMA.

## 2024-02-05 NOTE — Progress Notes (Deleted)
 02/05/2024 [name] [id number] Sep 01, 1960   Chief Complaint:  History of Present Illness: [name] is a 63 year old female with a past medical history of arthritis, depression, bipolar disorder, hypertension, hyperlipidemia. CAD s/ MI s/p 3 vessel CAD, CHF, asthma, COPD, OSA, tobacco use disorder, gastric ulcer with GIB.   Recently admitted to the hospital 01/28/2024 due to having chest pain.      Latest Ref Rng & Units 01/28/2024    3:43 PM 01/20/2024    3:46 PM 01/15/2024    1:50 PM  CBC  WBC 4.0 - 10.5 K/uL 11.3  13.3  10.9   Hemoglobin 12.0 - 15.0 g/dL 40.9  7.9  7.9   Hematocrit 36.0 - 46.0 % 34.0  25.8  24.9   Platelets 150 - 400 K/uL 464  434  486      EGD 01/07/2024: - No gross lesions in the entire esophagus. Z-line irregular, 37 cm from the incisors.  - 3 cm hiatal hernia.  - 3 non-bleeding gastric ulcers with a clean ulcer base (Forrest Class III) found in the antral region.  - Gastritis. Biopsied.  - Duodenitis in Bulb and Sweep. Biopsied.  - No gross lesions in the second portion of the duodenum - Advance diet as tolerated. - Recommend holding Plavix  for 72 hours to allow for further healing of gastric ulcers and inflammation. If aspirin  needs to be restarted, can restart tomorrow. - Increase PPI to 40 mg twice daily. - Initiate Carafate  1 g 4 times daily x 2 weeks and then twice daily for 1 month. Then may stop. - Observe patient's clinical course. - Await pathology results. - Repeat upper endoscopy in 4 months to check healing. Can likely pursue at same time as Colonoscopy as she is due this year for surveillance. Would need to coordinate with primary GI.  Colonoscopy 04/22/2021: - One 4 mm polyp in the ascending colon, removed with a cold snare. Resected and retrieved. - Two 4 mm polyps in the transverse colon, removed with a cold snare. Resected and retrieved.  - A single colonic angiodysplastic lesion.  - Two 3 to 6 mm polyps in the sigmoid colon,  removed with a cold snare. Resected and retrieved.  - Two 2 to 3 mm polyps at the recto-sigmoid colon, removed with a cold snare. Resected and retrieved.  - Internal hemorrhoids.  - The examination was otherwise normal.    Past Medical History:  Diagnosis Date   Allergy    Anemia    takes iron supplement   Arthritis    knees, shoulders   Asthma    Bipolar 1 disorder (HCC)    Blood transfusion without reported diagnosis    Cataract, immature    CHF (congestive heart failure) (HCC)    Clotting disorder (HCC)    she denied any specific genetic bleeding/clotting diagnosis (10/05/23)   COPD (chronic obstructive pulmonary disease) (HCC)    Coronary artery disease    triple vessel   Depression    Dyspnea    Family history of adverse reaction to anesthesia    states father did not do well with anesthesia; died last time he had surgery; also had heart disease   Gallstones    GERD (gastroesophageal reflux disease)    occasional - no current med.   Glaucoma    Hammertoe 05/2015   right second toe   Heart murmur    Hepatic steatosis    High cholesterol    History  of MI (myocardial infarction)    Hypertension    states under control with meds., has been on med. x 8 yr.   Myocardial infarction (HCC)    Osteoporosis    Panic attacks    Peripheral vascular disease (HCC)    carotid   Renal cyst, left    Schizophrenia (HCC)    Short of breath on exertion    Sleep apnea    Inspire in future   Urinary, incontinence, stress female    Wears dentures    upper   Wears partial dentures    lower   Past Surgical History:  Procedure Laterality Date   C-SECTIONS X 2  1985, 1988   CARDIAC CATHETERIZATION  12/10/2006; 12/18/2006; 07/10/2007; 07/19/2009   CARDIAC CATHETERIZATION  03/04/2010   patent grafts, normal LV function, diffusely diseased RCA (Dr. Aleda Ammon)   CARDIAC CATHETERIZATION N/A 02/15/2016   Procedure: Left Heart Cath and Cors/Grafts Angiography;  Surgeon: Peter M Swaziland,  MD;  Location: [facility] INVASIVE CV LAB;  Service: Cardiovascular;  Laterality: N/A;   CHOLECYSTECTOMY N/A 06/29/2023   Procedure: LAPAROSCOPIC CHOLECYSTECTOMY WITH ICG DYE;  Surgeon: Aldean Hummingbird, MD;  Location: [facility] ORS;  Service: General;  Laterality: N/A;   CLUB FOOT RELEASE Bilateral    1st and 2nd toes   COLONOSCOPY     CORONARY ARTERY BYPASS GRAFT  07/16/2007   LIMA to LAD, VG to ramus, VG to PDA    CORONARY STENT INTERVENTION N/A 06/23/2019   Procedure: CORONARY STENT INTERVENTION;  Surgeon: Avanell Leigh, MD;  Location: [facility] INVASIVE CV LAB;  Service: Cardiovascular;  Laterality: N/A;   DRUG INDUCED ENDOSCOPY N/A 05/17/2023   Procedure: DRUG INDUCED ENDOSCOPY;  Surgeon: Lind Repine, MD;  Location: [facility] ENDOSCOPY;  Service: Pulmonary;  Laterality: N/A;   ESOPHAGOGASTRODUODENOSCOPY N/A 01/07/2024   Procedure: EGD (ESOPHAGOGASTRODUODENOSCOPY);  Surgeon: Normie Becton., MD;  Location: [facility];  Service: Gastroenterology;  Laterality: N/A;   FLEXIBLE BRONCHOSCOPY  07/16/2007   HAMMERTOE RECONSTRUCTION WITH WEIL OSTEOTOMY Right 06/03/2015   Procedure: RIGHT SECOND HAMMERTOE CORRECTION WITH MT WEIL OSTEOTOMY;  Surgeon: Amada Backer, MD;  Location: [facility];  Service: Orthopedics;  Laterality: Right;   HAND SURGERY Bilateral    1st and 2nd fingers - release of clubbing   IMPLANTATION OF HYPOGLOSSAL NERVE STIMULATOR N/A 10/10/2023   Procedure: IMPLANTATION OF HYPOGLOSSAL NERVE STIMULATOR;  Surgeon: Artice Last, MD;  Location: [facility] OR;  Service: ENT;  Laterality: N/A;  right   KNEE ARTHROSCOPY WITH ANTERIOR CRUCIATE LIGAMENT (ACL) REPAIR Right 01/30/2008   LEFT HEART CATH AND CORS/GRAFTS ANGIOGRAPHY N/A 06/23/2019   Procedure: LEFT HEART CATH AND CORS/GRAFTS ANGIOGRAPHY;  Surgeon: Avanell Leigh, MD;  Location: [facility] INVASIVE CV LAB;  Service: Cardiovascular;  Laterality: N/A;   LEFT HEART CATH AND CORS/GRAFTS ANGIOGRAPHY N/A 03/01/2020   Procedure: LEFT HEART CATH AND  CORS/GRAFTS ANGIOGRAPHY;  Surgeon: Arty Binning, MD;  Location: [facility] INVASIVE CV LAB;  Service: Cardiovascular;  Laterality: N/A;   NOVASURE ABLATION  12/19/2007   with removal of IUD   ORIF FOOT FRACTURE Left    TOE SURGERIES TO REMOVE NAILS DUE TO CLUBBING     TOTAL KNEE ARTHROPLASTY Right 10/21/2012   Procedure: TOTAL KNEE ARTHROPLASTY;  Surgeon: Bevin Bucks, MD;  Location: [facility];  Service: Orthopedics;  Laterality: Right;   UPPER GASTROINTESTINAL ENDOSCOPY         Current Medications, Allergies, Past Medical History, Past Surgical History, Family History and Social History were  reviewed in [employer] electronic medical record.   Review of Systems:   Constitutional: Negative for fever, sweats, chills or weight loss.  Respiratory: Negative for shortness of breath.   Cardiovascular: Negative for chest pain, palpitations and leg swelling.  Gastrointestinal: See HPI.  Musculoskeletal: Negative for back pain or muscle aches.  Neurological: Negative for dizziness, headaches or paresthesias.    Physical Exam: LMP 11/16/2011  General: in no acute distress. Head: Normocephalic and atraumatic. Eyes: No scleral icterus. Conjunctiva pink . Ears: Normal auditory acuity. Mouth: Dentition intact. No ulcers or lesions.  Lungs: Clear throughout to auscultation. Heart: Regular rate and rhythm, no murmur. Abdomen: Soft, nontender and nondistended. No masses or hepatomegaly. Normal bowel sounds x 4 quadrants.  Rectal: *** Musculoskeletal: Symmetrical with no gross deformities. Extremities: No edema. Neurological: Alert oriented x 4. No focal deficits.  Psychological: Alert and cooperative. Normal mood and affect  Assessment and Recommendations: ***

## 2024-02-11 ENCOUNTER — Ambulatory Visit: Admitting: Nurse Practitioner

## 2024-02-12 ENCOUNTER — Ambulatory Visit: Admitting: Nurse Practitioner

## 2024-02-18 ENCOUNTER — Encounter (HOSPITAL_BASED_OUTPATIENT_CLINIC_OR_DEPARTMENT_OTHER): Payer: Self-pay | Admitting: Emergency Medicine

## 2024-02-18 ENCOUNTER — Telehealth: Payer: Self-pay | Admitting: Gastroenterology

## 2024-02-18 ENCOUNTER — Emergency Department (HOSPITAL_BASED_OUTPATIENT_CLINIC_OR_DEPARTMENT_OTHER)

## 2024-02-18 ENCOUNTER — Emergency Department (HOSPITAL_BASED_OUTPATIENT_CLINIC_OR_DEPARTMENT_OTHER)
Admission: EM | Admit: 2024-02-18 | Discharge: 2024-02-18 | Disposition: A | Discharge status: Home / Self Care | Attending: Emergency Medicine | Admitting: Emergency Medicine

## 2024-02-18 ENCOUNTER — Other Ambulatory Visit: Payer: Self-pay

## 2024-02-18 DIAGNOSIS — Z79899 Other long term (current) drug therapy: Secondary | ICD-10-CM | POA: Diagnosis not present

## 2024-02-18 DIAGNOSIS — I110 Hypertensive heart disease with heart failure: Secondary | ICD-10-CM | POA: Diagnosis not present

## 2024-02-18 DIAGNOSIS — I2510 Atherosclerotic heart disease of native coronary artery without angina pectoris: Secondary | ICD-10-CM | POA: Diagnosis not present

## 2024-02-18 DIAGNOSIS — D72829 Elevated white blood cell count, unspecified: Secondary | ICD-10-CM | POA: Insufficient documentation

## 2024-02-18 DIAGNOSIS — J45909 Unspecified asthma, uncomplicated: Secondary | ICD-10-CM | POA: Diagnosis not present

## 2024-02-18 DIAGNOSIS — I509 Heart failure, unspecified: Secondary | ICD-10-CM | POA: Insufficient documentation

## 2024-02-18 DIAGNOSIS — J449 Chronic obstructive pulmonary disease, unspecified: Secondary | ICD-10-CM | POA: Insufficient documentation

## 2024-02-18 DIAGNOSIS — R109 Unspecified abdominal pain: Secondary | ICD-10-CM | POA: Diagnosis present

## 2024-02-18 DIAGNOSIS — Z7982 Long term (current) use of aspirin: Secondary | ICD-10-CM | POA: Insufficient documentation

## 2024-02-18 DIAGNOSIS — R112 Nausea with vomiting, unspecified: Secondary | ICD-10-CM | POA: Diagnosis not present

## 2024-02-18 DIAGNOSIS — R1084 Generalized abdominal pain: Secondary | ICD-10-CM | POA: Diagnosis not present

## 2024-02-18 LAB — URINALYSIS, ROUTINE W REFLEX MICROSCOPIC
Bilirubin Urine: NEGATIVE
Glucose, UA: NEGATIVE mg/dL
Ketones, ur: NEGATIVE mg/dL
Leukocytes,Ua: NEGATIVE
Nitrite: NEGATIVE
Protein, ur: 30 mg/dL — AB
Specific Gravity, Urine: 1.022 (ref 1.005–1.030)
pH: 6 (ref 5.0–8.0)

## 2024-02-18 LAB — COMPREHENSIVE METABOLIC PANEL WITH GFR
ALT: 17 U/L (ref 0–44)
AST: 29 U/L (ref 15–41)
Albumin: 4.5 g/dL (ref 3.5–5.0)
Alkaline Phosphatase: 168 U/L — ABNORMAL HIGH (ref 38–126)
Anion gap: 16 — ABNORMAL HIGH (ref 5–15)
BUN: 17 mg/dL (ref 8–23)
CO2: 20 mmol/L — ABNORMAL LOW (ref 22–32)
Calcium: 10 mg/dL (ref 8.9–10.3)
Chloride: 101 mmol/L (ref 98–111)
Creatinine, Ser: 1.35 mg/dL — ABNORMAL HIGH (ref 0.44–1.00)
GFR, Estimated: 44 mL/min — ABNORMAL LOW (ref >60)
Glucose, Bld: 148 mg/dL — ABNORMAL HIGH (ref 70–99)
Potassium: 3.2 mmol/L — ABNORMAL LOW (ref 3.5–5.1)
Sodium: 138 mmol/L (ref 135–145)
Total Bilirubin: 0.5 mg/dL (ref 0.0–1.2)
Total Protein: 8 g/dL (ref 6.5–8.1)

## 2024-02-18 LAB — CBC
HCT: 33.7 % — ABNORMAL LOW (ref 36.0–46.0)
Hemoglobin: 11.1 g/dL — ABNORMAL LOW (ref 12.0–15.0)
MCH: 28.5 pg (ref 26.0–34.0)
MCHC: 32.9 g/dL (ref 30.0–36.0)
MCV: 86.4 fL (ref 80.0–100.0)
Platelets: 285 10*3/uL (ref 150–400)
RBC: 3.9 MIL/uL (ref 3.87–5.11)
RDW: 17.3 % — ABNORMAL HIGH (ref 11.5–15.5)
WBC: 15.3 10*3/uL — ABNORMAL HIGH (ref 4.0–10.5)
nRBC: 0 % (ref 0.0–0.2)

## 2024-02-18 LAB — LIPASE, BLOOD: Lipase: 21 U/L (ref 11–51)

## 2024-02-18 MED ORDER — SUCRALFATE 1 GM/10ML PO SUSP
1.0000 g | Freq: Once | ORAL | Status: AC
  Administered 2024-02-18: 1 g via ORAL
  Filled 2024-02-18: qty 10

## 2024-02-18 MED ORDER — ONDANSETRON HCL 4 MG/2ML IJ SOLN
4.0000 mg | Freq: Once | INTRAMUSCULAR | Status: AC
  Administered 2024-02-18: 4 mg via INTRAVENOUS
  Filled 2024-02-18: qty 2

## 2024-02-18 MED ORDER — POTASSIUM CHLORIDE CRYS ER 20 MEQ PO TBCR
40.0000 meq | EXTENDED_RELEASE_TABLET | Freq: Once | ORAL | Status: AC
  Administered 2024-02-18: 40 meq via ORAL
  Filled 2024-02-18: qty 2

## 2024-02-18 MED ORDER — HYDROMORPHONE HCL 1 MG/ML IJ SOLN
1.0000 mg | Freq: Once | INTRAMUSCULAR | Status: AC
  Administered 2024-02-18: 1 mg via INTRAVENOUS
  Filled 2024-02-18: qty 1

## 2024-02-18 MED ORDER — FAMOTIDINE IN NACL 20-0.9 MG/50ML-% IV SOLN
20.0000 mg | Freq: Once | INTRAVENOUS | Status: AC
Start: 2024-02-18 — End: 2024-02-18
  Administered 2024-02-18: 20 mg via INTRAVENOUS
  Filled 2024-02-18: qty 50

## 2024-02-18 MED ORDER — IOHEXOL 300 MG/ML  SOLN
100.0000 mL | Freq: Once | INTRAMUSCULAR | Status: AC | PRN
  Administered 2024-02-18: 100 mL via INTRAVENOUS

## 2024-02-18 MED ORDER — SODIUM CHLORIDE 0.9 % IV BOLUS
1000.0000 mL | Freq: Once | INTRAVENOUS | Status: AC
  Administered 2024-02-18: 1000 mL via INTRAVENOUS

## 2024-02-18 MED ORDER — METOCLOPRAMIDE HCL 5 MG/ML IJ SOLN
10.0000 mg | Freq: Once | INTRAMUSCULAR | Status: AC
  Administered 2024-02-18: 10 mg via INTRAVENOUS
  Filled 2024-02-18: qty 2

## 2024-02-18 NOTE — Telephone Encounter (Signed)
 Pt is a Dr Albertus pt seen by Harlene in May 2024 and procedure done by Dr Wilhelmenia in May 2025 while in the hospital per Dr Wilhelmenia procedure report pt is to go back to the primary GI

## 2024-02-18 NOTE — Telephone Encounter (Signed)
 Patient called and stated that she was in extreme pain and was feeling like the carafate  she was prescribed was not working. Patient also stated that she has been throwing up since Saturday morning. Patient is requesting a call back. Please advise.

## 2024-02-18 NOTE — Telephone Encounter (Signed)
 Agree with your recommendations for ER visit if unable to wait for clinic visit next week

## 2024-02-18 NOTE — ED Triage Notes (Signed)
 Pt via pov from home with abdominal and back pain x 3 days. Pt has extensive cardiac and kidney history. Endorses n/v/d. She also reports that she has ulcers. Pt alert & oriented, tearful during triage.

## 2024-02-18 NOTE — ED Provider Notes (Signed)
 [place] EMERGENCY DEPARTMENT AT [facility] Provider Note   CSN: [id number] Arrival date & time: 02/18/24  1702     Patient presents with: Abdominal Pain   [name] is Walsh 63 y.o. female.   Patient with history of COPD, CAD, hyperlipidemia, MI, hypertension presents today with complaints of abdominal pain. She states that same began 3 days ago after she ate lasagna and has been persistent since. Endorses pain in the upper abdomen and radiates to her back. Reports it is similar to when she was admitted and for to have several gastric ulcers Walsh few weeks ago. She has been taking her prescribed carafate  and protonix  without improvement. Reports nausea and vomiting, denies diarrhea. Called her GI office who scheduled her for an appointment next week. However, given her persistent pain, she did not feel like she could wait that long. Denies chest pain or shortness of breath, no fevers or chills. Also denies hematemesis, hematochezia, or melena.   The history is provided by the patient. No language interpreter was used.  Abdominal Pain Associated symptoms: nausea and vomiting        Prior to Admission medications   Medication Sig Start Date End Date Taking? Authorizing Provider  albuterol  (PROVENTIL  HFA;VENTOLIN  HFA) 108 (90 BASE) MCG/ACT inhaler Inhale 2 puffs into the lungs every 6 (six) hours as needed for wheezing or shortness of breath.     [provider]  Alirocumab  (PRALUENT ) 150 MG/ML SOAJ Inject 1 mL (150 mg total) into the skin every 14 (fourteen) days. 07/10/23   Jerilynn Lamarr HERO, NP  amLODipine  (NORVASC ) 5 MG tablet Take 1 tablet (5 mg total) by mouth daily. 01/24/24   Court Dorn PARAS, MD  aspirin  EC 81 MG tablet Take 1 tablet (81 mg total) by mouth daily. Swallow whole. 01/08/24 01/07/25  Juvenal Harlene PENNER, DO  atorvastatin  (LIPITOR ) 80 MG tablet Take 1 tablet (80 mg total) by mouth daily. 01/24/24   Court Dorn PARAS, MD  brimonidine  (ALPHAGAN ) 0.15 %  ophthalmic solution Place 1 drop into both eyes 2 (two) times daily.     [provider]  Budeson-Glycopyrrol-Formoterol  (BREZTRI  AEROSPHERE) 160-9-4.8 MCG/ACT AERO Inhale 2 puffs into the lungs in the morning and at bedtime. 04/30/23   Cobb, Comer GAILS, NP  clopidogrel  (PLAVIX ) 75 MG tablet Take 1 tablet (75 mg total) by mouth daily. 01/11/24 01/10/25  Juvenal Harlene PENNER, DO  escitalopram  (LEXAPRO ) 20 MG tablet Take 20 mg by mouth daily. 05/31/19   [provider]  fluticasone  (FLONASE ) 50 MCG/ACT nasal spray Place 2 sprays into both nostrils daily. Patient taking differently: Place 2 sprays into both nostrils daily as needed for allergies. 05/30/23   Soldatova, Liuba, MD  icosapent  Ethyl (VASCEPA ) 1 g capsule TAKE 2 CAPSULES BY MOUTH 2 TIMES DAILY. 09/03/23   Court Dorn PARAS, MD  isosorbide  mononitrate (IMDUR ) 30 MG 24 hr tablet Take 3 tablets (90 mg total) by mouth daily. 01/24/24   Court Dorn PARAS, MD  lamoTRIgine  (LAMICTAL ) 100 MG tablet Take 100 mg by mouth daily. 08/05/19   [provider]  metoprolol  tartrate (LOPRESSOR ) 25 MG tablet Take 0.5 tablets (12.5 mg total) by mouth 2 (two) times daily. 01/24/24   Court Dorn PARAS, MD  niacin  (NIASPAN ) 1000 MG CR tablet TAKE 1 TABLET (1,000 MG TOTAL) BY MOUTH AT BEDTIME. 02/21/23   Court Dorn PARAS, MD  nitroGLYCERIN  (NITROSTAT ) 0.4 MG SL tablet Place 1 tablet (0.4 mg total) under the tongue every 5 (five) minutes as  needed for chest pain. 01/24/24   Court Dorn PARAS, MD  ondansetron  (ZOFRAN -ODT) 4 MG disintegrating tablet Take 1 tablet (4 mg total) by mouth every 8 (eight) hours as needed for nausea or vomiting. 01/20/24   Garrick Charleston, MD  pantoprazole  (PROTONIX ) 40 MG tablet Take 1 tablet (40 mg total) by mouth 2 (two) times daily. 01/08/24   Vann, Jessica U, DO  QUEtiapine  Fumarate 150 MG TABS Take 150 mg by mouth at bedtime.    [provider]  ranolazine  (RANEXA ) 500 MG 12 hr tablet TAKE 1 TABLET BY MOUTH TWICE Walsh  DAY Patient taking differently: Take 1,000 mg by mouth 2 (two) times daily. 1 gram twice daily 09/15/22   Court Dorn PARAS, MD  sucralfate  (CARAFATE ) 1 g tablet Carafate  1 g 4 times daily x 2 weeks and  then twice daily for 1 month. Then may stop. 01/08/24   Vann, Jessica U, DO  VITAMIN E PO Take 1 tablet by mouth daily.    [provider]    Allergies: Benadryl  [diphenhydramine  hcl], Codeine, Haldol [haloperidol decanoate], Amoxicillin, and Bee venom    Review of Systems  Gastrointestinal:  Positive for abdominal pain, nausea and vomiting.  All other systems reviewed and are negative.   Updated Vital Signs BP (!) 148/97   Pulse 64   Temp 99.3 F (37.4 C) (Oral)   Resp (!) 23   Ht 5' 5.5 (1.664 m)   LMP 11/16/2011   SpO2 99%   BMI 32.15 kg/m   Physical Exam Vitals and nursing note reviewed.  Constitutional:      General: She is not in acute distress.    Appearance: Normal appearance. She is normal weight. She is not ill-appearing, toxic-appearing or diaphoretic.  HENT:     Head: Normocephalic and atraumatic.   Cardiovascular:     Rate and Rhythm: Normal rate.  Pulmonary:     Effort: Pulmonary effort is normal. No respiratory distress.  Abdominal:     General: Abdomen is flat.     Palpations: Abdomen is soft.     Tenderness: There is generalized abdominal tenderness.   Musculoskeletal:        General: Normal range of motion.     Cervical back: Normal range of motion.   Skin:    General: Skin is warm and dry.   Neurological:     General: No focal deficit present.     Mental Status: She is alert.   Psychiatric:        Mood and Affect: Mood normal.        Behavior: Behavior normal.     (all labs ordered are listed, but only abnormal results are displayed) Labs Reviewed  COMPREHENSIVE METABOLIC PANEL WITH GFR - Abnormal; Notable for the following components:      Result Value   Potassium 3.2 (*)    CO2 20 (*)    Glucose, Bld 148 (*)    Creatinine,  Ser 1.35 (*)    Alkaline Phosphatase 168 (*)    GFR, Estimated 44 (*)    Anion gap 16 (*)    All other components within normal limits  CBC - Abnormal; Notable for the following components:   WBC 15.3 (*)    Hemoglobin 11.1 (*)    HCT 33.7 (*)    RDW 17.3 (*)    All other components within normal limits  LIPASE, BLOOD  URINALYSIS, ROUTINE W REFLEX MICROSCOPIC    EKG: None  Radiology: No results found.  Procedures   Medications Ordered in the ED  HYDROmorphone  (DILAUDID ) injection 1 mg (1 mg Intravenous Given 02/18/24 1858)  ondansetron  (ZOFRAN ) injection 4 mg (4 mg Intravenous Given 02/18/24 1857)  sodium chloride  0.9 % bolus 1,000 mL (1,000 mLs Intravenous New Bag/Given 02/18/24 1857)  iohexol  (OMNIPAQUE ) 300 MG/ML solution 100 mL (100 mLs Intravenous Contrast Given 02/18/24 1843)                                    Medical Decision Making Amount and/or Complexity of Data Reviewed Labs: ordered. Radiology: ordered.  Risk Prescription drug management.   This patient is Walsh 63 y.o. female who presents to the ED for concern of abdominal pain, this involves an extensive number of treatment options, and is Walsh complaint that carries with it Walsh high risk of complications and morbidity. The emergent differential diagnosis prior to evaluation includes, but is not limited to,  The differential diagnosis for generalized abdominal pain includes, but is not limited to AAA, gastroenteritis, appendicitis, Bowel obstruction, Bowel perforation. Gastroparesis, DKA, Hernia, Inflammatory bowel disease, mesenteric ischemia, pancreatitis, peritonitis SBP, volvulus.  This is not an exhaustive differential.   Past Medical History / Co-morbidities / Social History:  has Walsh past medical history of Allergy, Anemia, Arthritis, Asthma, Bipolar 1 disorder (HCC), Blood transfusion without reported diagnosis, Cataract, immature, CHF (congestive heart failure) (HCC), Clotting disorder (HCC), COPD (chronic  obstructive pulmonary disease) (HCC), Coronary artery disease, Depression, Dyspnea, Family history of adverse reaction to anesthesia, Gallstones, GERD (gastroesophageal reflux disease), Glaucoma, Hammertoe (05/2015), Heart murmur, Hepatic steatosis, High cholesterol, History of MI (myocardial infarction), Hypertension, Myocardial infarction (HCC), Osteoporosis, Panic attacks, Peripheral vascular disease (HCC), Renal cyst, left, Schizophrenia (HCC), Short of breath on exertion, Sleep apnea, Urinary, incontinence, stress female, Wears dentures, and Wears partial dentures.  Additional history: Chart reviewed. Pertinent results include: has been seen several times recently for chest and abdominal pain. Was admitted for GI bleed and given transfusion, had endoscopy that showed gastric ulcers. Also see for chest pain and had normal cardiac work-up including CTA chest. Has an appointment with GI on 7/7  Physical Exam: Physical exam performed. The pertinent findings include: Generalized abdominal TTP throughout  Lab Tests: I ordered, and personally interpreted labs.  The pertinent results include:  WBC 15.3, hgb 11.1 (improved from last check 3 weeks ago). K 3.2, creatine 1.35 consistent with previous. Bicarb 20, anion gap 16, alk phos 168 mildly elevated from previous   Imaging Studies: I ordered imaging studies including CT abdomen pelvis. I independently visualized and interpreted imaging which showed   No acute abnormality noted.   Chronic changes unchanged from the prior study.  I agree with the radiologist interpretation.   Medications: I ordered medication including dilaudid , pepcid , reglan , zofran , fluids, carafate , oral potassium  for pain, dehydration, nausea/vomiting. Reevaluation of the patient after these medicines showed that the patient resolved. I have reviewed the patients home medicines and have made adjustments as needed.   Disposition: After consideration of the diagnostic results  and the patients response to treatment, I feel that emergency department workup does not suggest an emergent condition requiring admission or immediate intervention beyond what has been performed at this time. The plan is: discharge with close outpatient follow-up and return precautions.  Patient's workup is overall benign and she is feeling back to normal and ready to go home. Suspect symptoms are related to the ulcers she was diagnosed  with previously. No signs of GI bleed at this time. No signs or symptoms to suggest cardiac etiology of symptoms, had several cardiac evaluations previously including Walsh CT scan of her chest that was normal.  Given her symptoms improved with above interventions, no further evaluation is indicated.  She has an appointment with GI next week, emphasized importance of keeping this follow-up appointment.  She has appropriate reflux medications at home and also has Tramadol  for pain and Zofran  for nausea. Evaluation and diagnostic testing in the emergency department does not suggest an emergent condition requiring admission or immediate intervention beyond what has been performed at this time.  Plan for discharge with close PCP follow-up.  Patient is understanding and amenable with plan, educated on red flag symptoms that would prompt immediate return.  Patient discharged in stable condition.  Final diagnoses:  Generalized abdominal pain    ED Discharge Orders     None     An After Visit Summary was printed and given to the patient.      Angela Lamping A, PA-C 02/18/24 2122    Towana Ozell BROCKS, MD 02/19/24 [phone]

## 2024-02-18 NOTE — Telephone Encounter (Signed)
 Patient calls crying, almost unable to speak clearly due to this. States that she has been vomiting over and over all weekend and is having constant generalized abdominal pain despite taking pantoprazole  twice daily and carafate . Patient is unable to describe how the pain feels. She states she cannot continue to feel like this.   Patient was scheduled previously for office follow up 04/09/24, but per hospital notes, was to be scheduled 4-6 weeks after her hospital visit (which would be about now). Patient has been rescheduled to 02/27/24 to see Ellouise Console, PA-C. However, she is advised that if her pain is significant and she is unable to tolerate, if she is unable to tolerate PO, she should return to the emergency room for sooner evaluation (may need additional IV fluids etc). She verbalizes understanding.

## 2024-02-18 NOTE — Discharge Instructions (Signed)
 As we discussed, your workup in the ER today was reassuring for acute findings.  Laboratory evaluation and CT imaging did not reveal any emergent cause of your pain.  Given that you feel better after interventions today, no further evaluation is indicated.  Is very important that you see your GI doctor at your next scheduled appointment to discuss further management of the symptoms.  Please follow-up with your PCP as well.  Please continue to take your prescribed medications as prescribed every day.  Please maintain a diet particularly emphasizing bland foods that will not flare your symptoms.  I have attached some recommendations to this paperwork you can read through.  Return if development of any new or worsening symptoms.

## 2024-02-18 NOTE — Telephone Encounter (Signed)
 Patient was to follow up in the office 4-6 weeks after hospitalization 01/08/24 with either Dr Albertus or APP in office. We need to reschedule her 04/09/24 appointment to a sooner date.  Left message for patient to call back.

## 2024-02-24 NOTE — Progress Notes (Unsigned)
 Ellouise Console, PA-C [address] [place], [place]  [address] Phone: [phone]   Primary Care Physician: Rena Luke POUR, MD  Primary Gastroenterologist:  Ellouise Console, PA-C / Elspeth Naval, MD   Chief Complaint: Hospital follow-up abdominal pain and vomiting       HPI:   [name] is a 63 y.o. female presents for hospital follow-up of acute upper abdominal pain and vomiting.  She went to [facility] ED [facility] 02/18/2024 to evaluate abdominal pain.  Was having 3-day history of upper abdominal pain which started after eating lasagna.  Radiated to her back.  Reported remote history of gastric ulcers.  Was taking Carafate  and Protonix  with little benefit.  Had some nausea and vomiting.  No diarrhea.  Previous cholecystectomy.  02/18/2024 labs: Mild improved anemia with Hgb 11.1.  Elevated white count 15.3.  Normal lipase 21.  Low potassium 3.2, glucose 148, BUN 17, creatinine 1.35, GFR 44, elevated alk phos 168.  All other LFTs normal.  Previously labs from May showed hemoglobin between 7.9 and 8.9.  02/18/2024 abdominal pelvic CT with contrast: No acute abnormality.  Prior cholecystectomy.  Normal liver and pancreas.  Normal intestines.  01/07/2024 EGD by Dr. Wilhelmenia: 3 cm hiatal hernia, otherwise normal esophagus.  3 nonbleeding gastric ulcers (largest 10 mm).  Moderate erosive gastritis.  Moderate duodenitis.  Increase PPI pantoprazole  to 40 Mg twice daily and added Carafate  1 g 4 times daily.  Repeat EGD in 4 months (04/2024) to verify healing.  Biopsies negative for H. pylori and celiac.  04/2021 colonoscopy by Dr. Naval: 7 polyps tubular adenoma removed (2 mm to 6 mm).  Small internal hemorrhoids.  Single small angiodysplastic lesion in the transverse colon.  Good prep.  3-year repeat (due 04/2024).  PMH: CAD s/p PCI and CABG, HTN, COPD, OSA, acute GI bleed, chronic constipation, gastric ulcers, gastritis, GERD, tobacco dependence, bipolar disorder.   Currently on Plavix  and aspirin .  Component Ref Range & Units (hover) 6 d ago (02/18/24) 3 wk ago (01/28/24) 1 mo ago (01/20/24) 1 mo ago (01/15/24) 1 mo ago (01/08/24) 1 mo ago (01/07/24) 1 mo ago (01/06/24)  WBC 15.3 High  11.3 High  13.3 High  10.9 High  12.5 High  14.7 High  19.5 High   RBC 3.90 3.70 Low  2.80 Low  2.72 Low  2.69 Low  2.86 Low  2.44 Low   Hemoglobin 11.1 Low  10.5 Low  7.9 Low  7.9 Low  8.5 Low  8.9 Low  7.7 Low   HCT 33.7 Low  34.0 Low  25.8 Low  24.9 Low  25.3 Low  27.1 Low  22.9 Low   MCV 86.4 91.9 92.1 91.5 94.1 94.8 93.9  MCH 28.5 28.4 28.2 29.0 31.6 31.1 31.6  MCHC 32.9 30.9 30.6 31.7 33.6 32.8 33.6  RDW 17.3 High  16.2 High  16.2 High  15.5 15.5 14.6 14.5  Platelets 285 464 High  434 High  486 High  385 379 456 High    Component Ref Range & Units (hover) 6 d ago (02/18/24) 3 wk ago (01/28/24) 1 mo ago (01/20/24) 1 mo ago (01/15/24) 1 mo ago (01/08/24) 1 mo ago (01/07/24) 1 mo ago (01/07/24)  Sodium 138 139 137 139 136  134 Low   Potassium 3.2 Low  4.4 4.1 3.6 CM 3.2 Low  3.4 Low  CM 2.6 Low Panic  CM  Chloride 101 105 105 103 101  98  CO2 20 Low  24 22 23 25  23   Glucose, Bld 148 High  122 High  CM 160 High  CM 110 High  CM 114 High  CM  118 High  CM    BUN 17 17 10 16 19   38 High   Creatinine, Ser 1.35 High  1.29 High  1.23 High  1.30 High  0.96  1.37 High   Calcium  10.0 8.7 Low  8.8 Low  9.0 8.5 Low   8.4 Low   Total Protein 8.0  6.6 6.4 Low      Albumin  4.5  3.2 Low  3.7     AST 29  24 21  CM     ALT 17  11 10      Alkaline Phosphatase 168 High   146 High  139 High      Total Bilirubin 0.5  0.7 0.3     GFR, Estimated 44 Low  47 Low  CM 50 Low  CM 46 Low  CM >60        Current Outpatient Medications  Medication Sig Dispense Refill   albuterol  (PROVENTIL  HFA;VENTOLIN  HFA) 108 (90 BASE) MCG/ACT inhaler Inhale 2 puffs into the lungs every 6 (six) hours as needed for wheezing or shortness of breath.      Alirocumab  (PRALUENT ) 150 MG/ML SOAJ Inject 1 mL (150  mg total) into the skin every 14 (fourteen) days. 6 mL 6   amLODipine  (NORVASC ) 5 MG tablet Take 1 tablet (5 mg total) by mouth daily. 90 tablet 3   aspirin  EC 81 MG tablet Take 1 tablet (81 mg total) by mouth daily. Swallow whole.     atorvastatin  (LIPITOR ) 80 MG tablet Take 1 tablet (80 mg total) by mouth daily. 90 tablet 1   brimonidine  (ALPHAGAN ) 0.15 % ophthalmic solution Place 1 drop into both eyes 2 (two) times daily.      Budeson-Glycopyrrol-Formoterol  (BREZTRI  AEROSPHERE) 160-9-4.8 MCG/ACT AERO Inhale 2 puffs into the lungs in the morning and at bedtime. 10.7 each 5   clopidogrel  (PLAVIX ) 75 MG tablet Take 1 tablet (75 mg total) by mouth daily.     escitalopram  (LEXAPRO ) 20 MG tablet Take 20 mg by mouth daily.     fluticasone  (FLONASE ) 50 MCG/ACT nasal spray Place 2 sprays into both nostrils daily. (Patient taking differently: Place 2 sprays into both nostrils daily as needed for allergies.) 16 g 6   icosapent  Ethyl (VASCEPA ) 1 g capsule TAKE 2 CAPSULES BY MOUTH 2 TIMES DAILY. 120 capsule 3   isosorbide  mononitrate (IMDUR ) 30 MG 24 hr tablet Take 3 tablets (90 mg total) by mouth daily. 270 tablet 3   lamoTRIgine  (LAMICTAL ) 100 MG tablet Take 100 mg by mouth daily.     metoprolol  tartrate (LOPRESSOR ) 25 MG tablet Take 0.5 tablets (12.5 mg total) by mouth 2 (two) times daily. 90 tablet 3   niacin  (NIASPAN ) 1000 MG CR tablet TAKE 1 TABLET (1,000 MG TOTAL) BY MOUTH AT BEDTIME. 90 tablet 3   nitroGLYCERIN  (NITROSTAT ) 0.4 MG SL tablet Place 1 tablet (0.4 mg total) under the tongue every 5 (five) minutes as needed for chest pain. 25 tablet 11   ondansetron  (ZOFRAN -ODT) 4 MG disintegrating tablet Take 1 tablet (4 mg total) by mouth every 8 (eight) hours as needed for nausea or vomiting. 20 tablet 0   QUEtiapine  Fumarate 150 MG TABS Take 150 mg by mouth at bedtime.     ranolazine  (RANEXA ) 500 MG 12 hr tablet TAKE 1 TABLET BY MOUTH  TWICE A DAY (Patient taking differently: Take 1,000 mg by mouth 2  (two) times daily. 1 gram twice daily) 180 tablet 2   VITAMIN E PO Take 1 tablet by mouth daily.     pantoprazole  (PROTONIX ) 40 MG tablet Take 1 tablet (40 mg total) by mouth 2 (two) times daily. 60 tablet 2   sucralfate  (CARAFATE ) 1 g tablet Take 1 tablet (1 g total) by mouth 4 (four) times daily -  with meals and at bedtime. Carafate  1 g 4 times daily x 2 weeks and  then twice daily for 1 month. Then may stop. 120 tablet 2   No current facility-administered medications for this visit.    Allergies as of 02/27/2024 - Review Complete 02/27/2024  Allergen Reaction Noted   Benadryl  [diphenhydramine  hcl] Shortness Of Breath and Swelling 06/03/2015   Codeine Other (See Comments) 11/18/2011   Haldol [haloperidol decanoate] Anaphylaxis 11/18/2011   Amoxicillin Hives and Swelling 11/18/2011   Bee venom Swelling and Other (See Comments) 11/16/2015    Past Medical History:  Diagnosis Date   Allergy    Anemia    takes iron supplement   Arthritis    knees, shoulders   Asthma    Bipolar 1 disorder (HCC)    Blood transfusion without reported diagnosis    Cataract, immature    CHF (congestive heart failure) (HCC)    Clotting disorder (HCC)    she denied any specific genetic bleeding/clotting diagnosis (10/05/23)   COPD (chronic obstructive pulmonary disease) (HCC)    Coronary artery disease    triple vessel   Depression    Dyspnea    Family history of adverse reaction to anesthesia    states father did not do well with anesthesia; died last time he had surgery; also had heart disease   Gallstones    GERD (gastroesophageal reflux disease)    occasional - no current med.   Glaucoma    Hammertoe 05/2015   right second toe   Heart murmur    Hepatic steatosis    High cholesterol    History of MI (myocardial infarction)    Hypertension    states under control with meds., has been on med. x 8 yr.   Myocardial infarction (HCC)    Osteoporosis    Panic attacks    Peripheral vascular  disease (HCC)    carotid   Renal cyst, left    Schizophrenia (HCC)    Short of breath on exertion    Sleep apnea    Inspire in future   Urinary, incontinence, stress female    Wears dentures    upper   Wears partial dentures    lower    Past Surgical History:  Procedure Laterality Date   C-SECTIONS X 2  1985, 1988   CARDIAC CATHETERIZATION  12/10/2006; 12/18/2006; 07/10/2007; 07/19/2009   CARDIAC CATHETERIZATION  03/04/2010   patent grafts, normal LV function, diffusely diseased RCA (Dr. DOROTHA Lesches)   CARDIAC CATHETERIZATION N/A 02/15/2016   Procedure: Left Heart Cath and Cors/Grafts Angiography;  Surgeon: Peter M Swaziland, MD;  [facility] INVASIVE CV LAB;  Service: Cardiovascular;  Laterality: N/A;   CHOLECYSTECTOMY N/A 06/29/2023   Procedure: LAPAROSCOPIC CHOLECYSTECTOMY WITH ICG DYE;  Surgeon: Tanda Locus, MD;  Location: [facility] ORS;  Service: General;  Laterality: N/A;   CLUB FOOT RELEASE Bilateral    1st and 2nd toes   COLONOSCOPY     CORONARY ARTERY BYPASS GRAFT  07/16/2007   LIMA to LAD, VG to ramus,  VG to PDA    CORONARY STENT INTERVENTION N/A 06/23/2019   Procedure: CORONARY STENT INTERVENTION;  Surgeon: Court Dorn PARAS, MD;  Location: [facility] INVASIVE CV LAB;  Service: Cardiovascular;  Laterality: N/A;   DRUG INDUCED ENDOSCOPY N/A 05/17/2023   Procedure: DRUG INDUCED ENDOSCOPY;  Surgeon: Jude Harden GAILS, MD;  Location: [facility] ENDOSCOPY;  Service: Pulmonary;  Laterality: N/A;   ESOPHAGOGASTRODUODENOSCOPY N/A 01/07/2024   Procedure: EGD (ESOPHAGOGASTRODUODENOSCOPY);  Surgeon: Wilhelmenia Aloha Raddle., MD;  Location: [facility];  Service: Gastroenterology;  Laterality: N/A;   FLEXIBLE BRONCHOSCOPY  07/16/2007   HAMMERTOE RECONSTRUCTION WITH WEIL OSTEOTOMY Right 06/03/2015   Procedure: RIGHT SECOND HAMMERTOE CORRECTION WITH MT WEIL OSTEOTOMY;  Surgeon: Norleen Armor, MD;  Location: [facility];  Service: Orthopedics;  Laterality: Right;   HAND SURGERY Bilateral    1st and 2nd  fingers - release of clubbing   IMPLANTATION OF HYPOGLOSSAL NERVE STIMULATOR N/A 10/10/2023   Procedure: IMPLANTATION OF HYPOGLOSSAL NERVE STIMULATOR;  Surgeon: Okey Burns, MD;  Location: [facility] OR;  Service: ENT;  Laterality: N/A;  right   KNEE ARTHROSCOPY WITH ANTERIOR CRUCIATE LIGAMENT (ACL) REPAIR Right 01/30/2008   LEFT HEART CATH AND CORS/GRAFTS ANGIOGRAPHY N/A 06/23/2019   Procedure: LEFT HEART CATH AND CORS/GRAFTS ANGIOGRAPHY;  Surgeon: Court Dorn PARAS, MD;  Location: [facility] INVASIVE CV LAB;  Service: Cardiovascular;  Laterality: N/A;   LEFT HEART CATH AND CORS/GRAFTS ANGIOGRAPHY N/A 03/01/2020   Procedure: LEFT HEART CATH AND CORS/GRAFTS ANGIOGRAPHY;  Surgeon: Claudene Victory ORN, MD;  Location: [facility] INVASIVE CV LAB;  Service: Cardiovascular;  Laterality: N/A;   NOVASURE ABLATION  12/19/2007   with removal of IUD   ORIF FOOT FRACTURE Left    TOE SURGERIES TO REMOVE NAILS DUE TO CLUBBING     TOTAL KNEE ARTHROPLASTY Right 10/21/2012   Procedure: TOTAL KNEE ARTHROPLASTY;  Surgeon: Donnice JONETTA Car, MD;  Location: [facility];  Service: Orthopedics;  Laterality: Right;   UPPER GASTROINTESTINAL ENDOSCOPY      Review of Systems:    All systems reviewed and negative except where noted in HPI.    Physical Exam:  BP 100/60 (BP Location: Left Arm, Patient Position: Sitting, Cuff Size: Large)   Pulse 60   Ht 5' 3.5 (1.613 m) Comment: height measured without shoes  Wt 190 lb (86.2 kg)   LMP 11/16/2011   BMI 33.13 kg/m  Patient's last menstrual period was 11/16/2011.  General: Well-nourished, well-developed in no acute distress.  Lungs: Clear to auscultation bilaterally. Non-labored. Heart: Regular rate and rhythm, no murmurs rubs or gallops.  Abdomen: Bowel sounds are normal; Abdomen is Soft; No hepatosplenomegaly, masses or hernias;  Moderate Epigastric and general upper Abdominal Tenderness; No lower abdominal tenderness.  No guarding or rebound tenderness. Neuro: Alert and oriented x 3.  Grossly  intact.  Psych: Alert and cooperative, Anxious / Depressed mood and affect.     Imaging Studies: CT ABDOMEN PELVIS W CONTRAST Result Date: 02/18/2024 CLINICAL DATA:  Acute abdominal pain for 3 days EXAM: CT ABDOMEN AND PELVIS WITH CONTRAST TECHNIQUE: Multidetector CT imaging of the abdomen and pelvis was performed using the standard protocol following bolus administration of intravenous contrast. RADIATION DOSE REDUCTION: This exam was performed according to the departmental dose-optimization program which includes automated exposure control, adjustment of the mA and/or kV according to patient size and/or use of iterative reconstruction technique. CONTRAST:  OMNIPAQUE  IOHEXOL  300 MG/ML  SOLN COMPARISON:  01/20/2024 FINDINGS: Lower chest: No acute abnormality. Hepatobiliary: No focal liver abnormality is seen.  Status post cholecystectomy. No biliary dilatation. Pancreas: Unremarkable. No pancreatic ductal dilatation or surrounding inflammatory changes. Spleen: Stable 1.9 cm hypodensity is again seen in the medial aspect of the spleen stable from multiple previous exams. Adrenals/Urinary Tract: Adrenal glands are within normal limits. Kidneys demonstrate no renal calculi or obstructive change. Multifocal cystic lesions are noted and stable. No follow-up is recommended. The bladder is partially distended. Stomach/Bowel: No obstructive or inflammatory changes of the colon are seen. Mild diverticular changes noted without diverticulitis. The appendix is within normal limits. Stomach and small bowel are within normal limits. Vascular/Lymphatic: Aortic atherosclerosis. No enlarged abdominal or pelvic lymph nodes. Reproductive: Uterus and bilateral adnexa are unremarkable. Other: No abdominal wall hernia or abnormality. No abdominopelvic ascites. Musculoskeletal: No acute or significant osseous findings. Stable intramuscular lipoma in the left abductor magnus. IMPRESSION: No acute abnormality noted. Chronic  changes unchanged from the prior study. Electronically Signed   By: Oneil Devonshire M.D.   On: 02/18/2024 19:14   CT Angio Chest PE W/Cm &/Or Wo Cm Result Date: 01/28/2024 CLINICAL DATA:  Pulmonary embolism (PE) suspected, high prob EXAM: CT ANGIOGRAPHY CHEST WITH CONTRAST TECHNIQUE: Multidetector CT imaging of the chest was performed using the standard protocol during bolus administration of intravenous contrast. Multiplanar CT image reconstructions and MIPs were obtained to evaluate the vascular anatomy. RADIATION DOSE REDUCTION: This exam was performed according to the departmental dose-optimization program which includes automated exposure control, adjustment of the mA and/or kV according to patient size and/or use of iterative reconstruction technique. CONTRAST:  75mL OMNIPAQUE  IOHEXOL  350 MG/ML SOLN COMPARISON:  01/15/2024. FINDINGS: Cardiovascular: Satisfactory opacification of the pulmonary arteries to the segmental level. No evidence of pulmonary embolism. Nonaneurysmal thoracic aorta. Moderate aortic atherosclerosis. Prior CABG. Coronary artery calcification. Normal heart size. No pericardial effusion. Mediastinum/Nodes: No enlarged mediastinal, hilar, or axillary lymph nodes. Thyroid  gland, trachea, and esophagus demonstrate no significant findings. Lungs/Pleura: Lungs are clear. No pleural effusion or pneumothorax. Upper Abdomen: Reflux of contrast into the hepatic veins, which can be seen in the setting of right heart dysfunction. Status post cholecystectomy. Musculoskeletal: Chronic discontinuity is again noted throughout the sternotomy wires. No acute osseous abnormality. No suspicious osseous lesion. Review of the MIP images confirms the above findings. IMPRESSION: 1. No evidence of pulmonary embolism. 2. Reflux of contrast into the hepatic veins, which can be seen in the setting of right heart dysfunction. 3. No acute intrathoracic findings. 4. Aortic Atherosclerosis (ICD10-I70.0). Electronically  Signed   By: Harrietta Sherry M.D.   On: 01/28/2024 19:09   DG Chest 1 View Result Date: 01/28/2024 CLINICAL DATA:  Shortness of breath, chest pain EXAM: CHEST  1 VIEW COMPARISON:  01/20/2024 FINDINGS: Prior CABG. Heart and mediastinal contours within normal limits. Aortic atherosclerosis. No confluent airspace opacities, effusions or edema. No acute bony abnormality. IMPRESSION: No active disease. Electronically Signed   By: Franky Crease M.D.   On: 01/28/2024 17:17    Labs: CBC    Component Value Date/Time   WBC 15.3 (H) 02/18/2024 1721   RBC 3.90 02/18/2024 1721   HGB 11.1 (L) 02/18/2024 1721   HCT 33.7 (L) 02/18/2024 1721   PLT 285 02/18/2024 1721   MCV 86.4 02/18/2024 1721   MCH 28.5 02/18/2024 1721   MCHC 32.9 02/18/2024 1721   RDW 17.3 (H) 02/18/2024 1721   LYMPHSABS 2.3 07/06/2023 1952   MONOABS 1.1 (H) 07/06/2023 1952   EOSABS 0.1 07/06/2023 1952   BASOSABS 0.1 07/06/2023 1952    CMP  Component Value Date/Time   NA 138 02/18/2024 1721   K 3.2 (L) 02/18/2024 1721   CL 101 02/18/2024 1721   CO2 20 (L) 02/18/2024 1721   GLUCOSE 148 (H) 02/18/2024 1721   BUN 17 02/18/2024 1721   CREATININE 1.35 (H) 02/18/2024 1721   CREATININE 1.10 (H) 02/11/2016 0921   CALCIUM  10.0 02/18/2024 1721   PROT 8.0 02/18/2024 1721   PROT 7.0 10/03/2022 1524   ALBUMIN  4.5 02/18/2024 1721   ALBUMIN  4.2 10/03/2022 1524   AST 29 02/18/2024 1721   ALT 17 02/18/2024 1721   ALKPHOS 168 (H) 02/18/2024 1721   BILITOT 0.5 02/18/2024 1721   BILITOT <0.2 10/03/2022 1524   GFRNONAA 44 (L) 02/18/2024 1721   GFRAA >60 03/07/2020 0313       Assessment and Plan:   [name] is a 63 y.o. y/o female   1.  Gastric ulcers (H. pylori negative) - Continue twice daily PPI and Carafate  - Plan for 76-month repeat EGD 04/2024 to verify healing - Avoid NSAIDs  2.  Erosive gastritis - Avoid NSAIDs. - Continue twice daily PPI and Carafate  1g QID.  3.  Adenomatous colon polyps - 3-year  repeat colonoscopy will be due 04/2024  4.  Iron Deficiency Anemia - Labs: CBC, iron panel, ferritin   5.  Leukocytosis - Labs CBC  Ellouise Console, PA-C  Follow up ***

## 2024-02-27 ENCOUNTER — Encounter: Payer: Self-pay | Admitting: Physician Assistant

## 2024-02-27 ENCOUNTER — Ambulatory Visit: Admitting: Physician Assistant

## 2024-02-27 ENCOUNTER — Other Ambulatory Visit (INDEPENDENT_AMBULATORY_CARE_PROVIDER_SITE_OTHER)

## 2024-02-27 VITALS — BP 100/60 | HR 60 | Ht 63.5 in | Wt 190.0 lb

## 2024-02-27 DIAGNOSIS — K2970 Gastritis, unspecified, without bleeding: Secondary | ICD-10-CM | POA: Diagnosis not present

## 2024-02-27 DIAGNOSIS — R1084 Generalized abdominal pain: Secondary | ICD-10-CM

## 2024-02-27 DIAGNOSIS — E559 Vitamin D deficiency, unspecified: Secondary | ICD-10-CM

## 2024-02-27 DIAGNOSIS — D72829 Elevated white blood cell count, unspecified: Secondary | ICD-10-CM

## 2024-02-27 DIAGNOSIS — K259 Gastric ulcer, unspecified as acute or chronic, without hemorrhage or perforation: Secondary | ICD-10-CM

## 2024-02-27 DIAGNOSIS — Z860100 Personal history of colon polyps, unspecified: Secondary | ICD-10-CM

## 2024-02-27 DIAGNOSIS — R748 Abnormal levels of other serum enzymes: Secondary | ICD-10-CM

## 2024-02-27 DIAGNOSIS — Z860101 Personal history of adenomatous and serrated colon polyps: Secondary | ICD-10-CM

## 2024-02-27 DIAGNOSIS — D509 Iron deficiency anemia, unspecified: Secondary | ICD-10-CM | POA: Diagnosis not present

## 2024-02-27 LAB — CBC WITH DIFFERENTIAL/PLATELET
Basophils Absolute: 0.1 K/uL (ref 0.0–0.1)
Basophils Relative: 1 % (ref 0.0–3.0)
Eosinophils Absolute: 0.2 K/uL (ref 0.0–0.7)
Eosinophils Relative: 2.9 % (ref 0.0–5.0)
HCT: 31.1 % — ABNORMAL LOW (ref 36.0–46.0)
Hemoglobin: 10 g/dL — ABNORMAL LOW (ref 12.0–15.0)
Lymphocytes Relative: 33.2 % (ref 12.0–46.0)
Lymphs Abs: 2.8 K/uL (ref 0.7–4.0)
MCHC: 32.3 g/dL (ref 30.0–36.0)
MCV: 84.8 fl (ref 78.0–100.0)
Monocytes Absolute: 0.7 K/uL (ref 0.1–1.0)
Monocytes Relative: 8.7 % (ref 3.0–12.0)
Neutro Abs: 4.5 K/uL (ref 1.4–7.7)
Neutrophils Relative %: 54.2 % (ref 43.0–77.0)
Platelets: 320 K/uL (ref 150.0–400.0)
RBC: 3.67 Mil/uL — ABNORMAL LOW (ref 3.87–5.11)
RDW: 19.2 % — ABNORMAL HIGH (ref 11.5–15.5)
WBC: 8.3 K/uL (ref 4.0–10.5)

## 2024-02-27 LAB — BASIC METABOLIC PANEL WITH GFR
BUN: 14 mg/dL (ref 6–23)
CO2: 27 meq/L (ref 19–32)
Calcium: 8.7 mg/dL (ref 8.4–10.5)
Chloride: 106 meq/L (ref 96–112)
Creatinine, Ser: 0.99 mg/dL (ref 0.40–1.20)
GFR: 61.27 mL/min (ref >60.00)
Glucose, Bld: 115 mg/dL — ABNORMAL HIGH (ref 70–99)
Potassium: 4 meq/L (ref 3.5–5.1)
Sodium: 140 meq/L (ref 135–145)

## 2024-02-27 LAB — HEPATIC FUNCTION PANEL
ALT: 11 U/L (ref 0–35)
AST: 14 U/L (ref 0–37)
Albumin: 3.8 g/dL (ref 3.5–5.2)
Alkaline Phosphatase: 124 U/L — ABNORMAL HIGH (ref 39–117)
Bilirubin, Direct: 0.1 mg/dL (ref 0.0–0.3)
Total Bilirubin: 0.3 mg/dL (ref 0.2–1.2)
Total Protein: 6.5 g/dL (ref 6.0–8.3)

## 2024-02-27 LAB — VITAMIN D 25 HYDROXY (VIT D DEFICIENCY, FRACTURES): VITD: 20.59 ng/mL — ABNORMAL LOW (ref 30.00–100.00)

## 2024-02-27 LAB — GAMMA GT: GGT: 7 U/L (ref 7–51)

## 2024-02-27 MED ORDER — SUCRALFATE 1 G PO TABS: 1.0000 g | ORAL_TABLET | Freq: Three times a day (TID) | ORAL | 2 refills | Status: AC

## 2024-02-27 MED ORDER — PANTOPRAZOLE SODIUM 40 MG PO TBEC: 40.0000 mg | DELAYED_RELEASE_TABLET | Freq: Two times a day (BID) | ORAL | 2 refills | Status: DC

## 2024-02-27 NOTE — Patient Instructions (Signed)
 Your provider has requested that you go to the basement level for lab work before leaving today. Press B on the elevator. The lab is located at the first door on the left as you exit the elevator.  We have sent the following medications to your pharmacy for you to pick up at your convenience: Pantoprazole  40 mg twice daily and Sucralfate  1 g once daily  You have been scheduled for an Endoscopy and Colonoscopy. Please follow the written instructions given to you at your visit today.  If you use inhalers (even only as needed), please bring them with you on the day of your procedure.  DO NOT TAKE 7 DAYS PRIOR TO TEST- Trulicity (dulaglutide) Ozempic, Wegovy (semaglutide) Mounjaro (tirzepatide) Bydureon Bcise (exanatide extended release)  DO NOT TAKE 1 DAY PRIOR TO YOUR TEST Rybelsus (semaglutide) Adlyxin (lixisenatide) Victoza (liraglutide) Byetta (exanatide) ___________________________________________________________________________  Please follow up sooner if symptoms increase or worsen __________________________________________________________________________  Due to recent changes in healthcare laws, you may see the results of your imaging and laboratory studies on MyChart before your provider has had a chance to review them.  We understand that in some cases there may be results that are confusing or concerning to you. Not all laboratory results come back in the same time frame and the provider may be waiting for multiple results in order to interpret others.  Please give us  48 hours in order for your provider to thoroughly review all the results before contacting the office for clarification of your results.   Thank you for trusting me with your gastrointestinal care!   Ellouise Console, PA-C _______________________________________________________  If your blood pressure at your visit was 140/90 or greater, please contact your primary care physician to follow up on  this.  _______________________________________________________  If you are age 46 or older, your body mass index should be between 23-30. Your Body mass index is 33.13 kg/m. If this is out of the aforementioned range listed, please consider follow up with your Primary Care Provider.  If you are age 88 or younger, your body mass index should be between 19-25. Your Body mass index is 33.13 kg/m. If this is out of the aformentioned range listed, please consider follow up with your Primary Care Provider.   ________________________________________________________  The [facility] providers would like to encourage you to use MYCHART to communicate with providers for non-urgent requests or questions.  Due to long hold times on the telephone, sending your provider a message by [employer] may be a faster and more efficient way to get a response.  Please allow 48 business hours for a response.  Please remember that this is for non-urgent requests.  _______________________________________________________

## 2024-02-28 ENCOUNTER — Telehealth: Payer: Self-pay

## 2024-02-28 ENCOUNTER — Encounter: Payer: Self-pay | Admitting: Physician Assistant

## 2024-02-28 NOTE — Telephone Encounter (Signed)
  [facility] Pre-operative Risk Assessment     Request for surgical clearance:     Endoscopy Procedure  What type of surgery is being performed?     Colonoscopy and Endoscopy  When is this surgery scheduled?     04/29/24  What type of clearance is required ?   Pharmacy  Are there any medications that need to be held prior to surgery and how long? Plavix  5 days  Practice name and name of physician performing surgery?      [facility]  What is your office phone and fax number?      Phone- [phone]  Fax- [phone]  Anesthesia type (None, local, MAC, general) ?       MAC   Please route your response to Alethea Blocker, CMA

## 2024-02-28 NOTE — Progress Notes (Signed)
 Agree with assessment and plan as outlined.

## 2024-02-29 LAB — PTH, INTACT AND CALCIUM
Calcium: 8 mg/dL — ABNORMAL LOW (ref 8.6–10.4)
PTH: 42 pg/mL (ref 16–77)

## 2024-02-29 LAB — MITOCHONDRIAL ANTIBODIES: Mitochondrial M2 Ab, IgG: <=20 U (ref <=20.0)

## 2024-02-29 LAB — IRON,TIBC AND FERRITIN PANEL
%SAT: 5 % — ABNORMAL LOW (ref 16–45)
Ferritin: 12 ng/mL — ABNORMAL LOW (ref 16–288)
Iron: 22 ug/dL — ABNORMAL LOW (ref 45–160)
TIBC: 420 ug/dL (ref 250–450)

## 2024-02-29 LAB — ALKALINE PHOSPHATASE ISOENZYMES
Alkaline phosphatase (APISO): 119 U/L (ref 37–153)
Bone Isoenzymes: 36 % (ref 28–66)
Intestinal Isoenzymes: 0 % — ABNORMAL LOW (ref 1–24)
Liver Isoenzymes: 64 % (ref 25–69)
Macrohepatic isoenzymes: 0 % (ref <=0)
Placental isoenzymes: 0 % (ref <=0)

## 2024-02-29 LAB — ANTI-SMOOTH MUSCLE ANTIBODY, IGG: Actin (Smooth Muscle) Antibody (IGG): <20 U (ref <20)

## 2024-02-29 LAB — ANA: Anti Nuclear Antibody (ANA): NEGATIVE

## 2024-02-29 NOTE — Telephone Encounter (Signed)
   Name: [name]  DOB: 09/06/1960  MRN: [id number]  Primary Cardiologist: Dorn Lesches, MD  Chart reviewed as part of pre-operative protocol coverage. Because of LYNIX BONINE past medical history and time since last visit, she will require a follow-up in-office visit in order to better assess preoperative cardiovascular risk.  Pre-op covering staff: - Please schedule appointment and call patient to inform them. If patient already had an upcoming appointment within acceptable timeframe, please add pre-op clearance to the appointment notes so provider is aware. - Please contact requesting surgeon's office via preferred method (i.e, phone, fax) to inform them of need for appointment prior to surgery.  Follow up will need to be with Dr. Lesches or APP when Dr. Lesches is also in the clinic. Patient has history of chest pain, recently admitted with chest pain but left AMA.   Cherene Dobbins, [place]  02/29/2024, 1:30 PM

## 2024-02-29 NOTE — Telephone Encounter (Signed)
 1st attempt to reach pt regarding surgical clearance and the need for an IN OFFICE appointment.  Unable to LVM

## 2024-03-03 ENCOUNTER — Ambulatory Visit: Payer: Self-pay | Admitting: Physician Assistant

## 2024-03-03 DIAGNOSIS — D509 Iron deficiency anemia, unspecified: Secondary | ICD-10-CM

## 2024-03-03 DIAGNOSIS — E559 Vitamin D deficiency, unspecified: Secondary | ICD-10-CM

## 2024-03-03 NOTE — Telephone Encounter (Signed)
 Patient is scheduled to see Dr. Court on 03/05/24 at 10:30 AM.

## 2024-03-03 NOTE — Telephone Encounter (Signed)
 Called pt to schedule OV for preop clearance. Unable to contact pt or LVM.

## 2024-03-05 ENCOUNTER — Ambulatory Visit: Admitting: Cardiovascular Disease

## 2024-03-15 ENCOUNTER — Other Ambulatory Visit: Payer: Self-pay | Admitting: Cardiovascular Disease

## 2024-03-15 DIAGNOSIS — E785 Hyperlipidemia, unspecified: Secondary | ICD-10-CM

## 2024-03-15 DIAGNOSIS — I10 Essential (primary) hypertension: Secondary | ICD-10-CM

## 2024-03-20 ENCOUNTER — Encounter (HOSPITAL_COMMUNITY): Payer: Self-pay | Admitting: *Deleted

## 2024-03-20 ENCOUNTER — Encounter: Payer: Self-pay | Admitting: Emergency Medicine

## 2024-03-20 ENCOUNTER — Ambulatory Visit: Attending: Emergency Medicine | Admitting: Emergency Medicine

## 2024-03-20 VITALS — BP 114/60 | HR 65 | Ht 65.0 in | Wt 183.6 lb

## 2024-03-20 DIAGNOSIS — Z01810 Encounter for preprocedural cardiovascular examination: Secondary | ICD-10-CM | POA: Insufficient documentation

## 2024-03-20 DIAGNOSIS — Z951 Presence of aortocoronary bypass graft: Secondary | ICD-10-CM | POA: Insufficient documentation

## 2024-03-20 DIAGNOSIS — I25709 Atherosclerosis of coronary artery bypass graft(s), unspecified, with unspecified angina pectoris: Secondary | ICD-10-CM | POA: Insufficient documentation

## 2024-03-20 DIAGNOSIS — E7800 Pure hypercholesterolemia, unspecified: Secondary | ICD-10-CM | POA: Insufficient documentation

## 2024-03-20 DIAGNOSIS — I10 Essential (primary) hypertension: Secondary | ICD-10-CM | POA: Diagnosis present

## 2024-03-20 DIAGNOSIS — G4733 Obstructive sleep apnea (adult) (pediatric): Secondary | ICD-10-CM | POA: Diagnosis present

## 2024-03-20 MED ORDER — RANOLAZINE ER 1000 MG PO TB12: 1000.0000 mg | ORAL_TABLET | Freq: Two times a day (BID) | ORAL | 1 refills | Status: AC

## 2024-03-20 MED ORDER — ISOSORBIDE MONONITRATE ER 120 MG PO TB24: 120.0000 mg | ORAL_TABLET | Freq: Every day | ORAL | 1 refills | Status: AC

## 2024-03-20 NOTE — Progress Notes (Addendum)
 Cardiology Office Note:    Date:  03/22/2024  ID:  [name], DOB 02/15/1961, MRN [id number] PCP: Rena Luke POUR, MD  [facility] Providers Cardiologist:  Dorn Lesches, MD       Patient Profile:       Chief Complaint: Hospital follow-up and preoperative clearance History of Present Illness:  [name] is a 63 y.o. female with visit-pertinent history of CAD s/p CABG x 3 with LIMA to LAD, SVG to ramus branch, SVG to PDA in 06/2007 with subsequent stenting to SVG-PDA in 06/2019 and chronic chest pain, COPD, hypertension, hyperlipidemia, OSA not on CPAP, GERD, bipolar disorder  Patient with prior history of stenting to RCA in the remote past and CABG x 3 with LIMA to LAD, SVG to ramus branch and SVG to PDA in 06/2007.  Patient was admitted in 05/2019 with unstable angina.  She underwent successful PCI with 3 overlapping DES to distal SVG to PDA lesion.  She was treated with Aggrastat  for 18 hours following this.  She was readmitted in 02/2020 with NSTEMI.  Cardiac cath showed patent LIMA to LAD, diffusely diseased but patent SVG to ramus intermedius and total occlusion of the mid and distal segments of the SVG to PDA with left-to-right collaterals.  Echocardiogram showed EF 55 to 6%, normal wall motion.  Continue medical therapy was recommended with her Ranexa  which was increased.  On 05/10/2021 she had an episode of reported syncope that sounded like orthostatic hypotension/vasovagal syncope.  Echocardiogram was ordered given syncope and showed LVEF 55 to 60% with normal wall motion with no significant valvular disease.  Monitor was offered however patient declined.  She had a Myoview  performed on 01/06/2022 which was low risk with minimal inferior scar.  In 06/2023 patient underwent elective laparoscopic cholecystectomy, following she had nausea and vomiting abdominal pain which apparently led to some dehiscence of the umbilical incision. Workup in the emergency room included  troponin which were elevated at 396 and 406 respectively.  Echocardiogram with preserved LVEF but with wall motion abnormalities.  Troponin elevation was likely demand ischemia.  Medical management was continued.  On chart review on 01/06/2024 patient presented to the ED with abdominal pain, she also reported having bright red blood as well as black stools intermittent through out recent illness.  Patient reported that she had increased fatigue, abdominal pain, nausea, vomiting and diarrhea.  Patient's hemoglobin was 7.7, down from 14.6 3 months prior.  Patient was treated with 1 unit of PRBC.  Patient tested positive for Campylobacter infection.  EGD indicated 3 cm hiatal hernia, 3 nonbleeding gastric ulcers with a clean ulcer base found in the antral region and gastritis as well as duodenitis, her PPI was increased to 40 mg twice daily and started on Carafate .  Patient was discharged on 01/08/2024.  On chart review on 01/15/2024 patient returned to the emergency department with increased shortness of breath, she reported that her chest discomfort was at her baseline.  She reported that her biggest concern was worsening breathing, she noted that she had improvement in her shortness of breath after given 1 unit of blood however this progressively worsened after discharge from the hospital.  Patient had an elevated D-dimer with subsequent CT angio chest PE without evidence of PE, pneumothorax, pneumonia or other acute cardiopulmonary abnormality, patient was anemic with hemoglobin of 7.9, upon discharge hemoglobin improved to 8.5. HsTrop 26>>26. Patient was discharged in stable condition.   Patient return to the ED on 01/20/2024 for chest  pain, she reported that over the prior weeks she had had ongoing pain that was waxing and waning with some improvement with nitroglycerin .  Patient reported that that day her pain was more persistent including in the abdomen. Hstrop 12>>12.  Patient's hemoglobin was again 7.9,  treated with another unit of RBCs.  Patient was further treated with Pepcid , Reglan  and Carafate  with improvement of symptoms.  Patient was last seen in clinic on 01/25/2024.  She reports she had progressively worsening chest pain over the last week as well as shortness of breath forcing her to take nitroglycerin  frequently.  Patient was sent to the ED for further evaluation  She presented to the ED and was ultimately admitted.  Troponins and ECG ruled out ACS.  Her Imdur  was increased to 120 mg daily as an initial step to hopefully improve her anginal symptoms.  She ultimately left AMA due to family concerns.  She is now pending colonoscopy and endoscopy with [facility].   Discussed the use of AI scribe software for clinical note transcription with the patient, who gave verbal consent to proceed.  History of Present Illness Today patient continues to experience chronic daily angina.  She continues to experience chest pains both on exertion and at rest.  This has been unchanged for several years.  She reports taking about 3 nitroglycerin  per week.  This past weekend she did experience an episode of severe chest pain that was associated with nausea and diaphoresis. She reported this episode may in different from her prior episodes of angina.  She took two nitroglycerin  tablets during the event, which alleviated her symptoms.  She does deny any dyspnea on exertion, orthopnea, PND, leg swelling, palpitations.  She has had no syncope or presyncope.  She denies any lightheadedness or dizziness.  She is active with housework, yard work, and volunteering. She smokes twice a week. She recently received an Inspire device for sleep apnea, improving her sleep quality.  Review of systems:  Please see the history of present illness. All other systems are reviewed and otherwise negative.      Studies Reviewed:    EKG Interpretation Date/Time:  Thursday March 20 2024 10:02:03 EDT Ventricular Rate:  65 PR  Interval:  176 QRS Duration:  118 QT Interval:  438 QTC Calculation: 455 R Axis:   -9  Text Interpretation: Normal sinus rhythm Left ventricular hypertrophy with QRS widening ( R in aVL , Cornell product ) Inferior infarct (cited on or before 25-Jan-2024) When compared with ECG of 28-Jan-2024 15:34, Criteria for Anterior infarct are no longer Present Criteria for Anterolateral infarct are no longer Present Confirmed by Rana Dixon [phone]) on 03/22/2024 12:12:39 AM    Echocardiogram 07/01/2023  1. Left ventricular ejection fraction, by estimation, is 50 to 55%. The  left ventricle has low normal function. The left ventricle demonstrates  regional wall motion abnormalities (see scoring diagram/findings for  description). Left ventricular diastolic   parameters were normal.   2. Right ventricular systolic function is normal. The right ventricular  size is normal. Tricuspid regurgitation signal is inadequate for assessing  PA pressure.   3. Left atrial size was moderately dilated.   4. The mitral valve is normal in structure. No evidence of mitral valve  regurgitation. No evidence of mitral stenosis.   5. The aortic valve is tricuspid. Aortic valve regurgitation is not  visualized. Aortic valve sclerosis is present, with no evidence of aortic  valve stenosis.   6. The inferior vena cava is normal  in size with greater than 50%  respiratory variability, suggesting right atrial pressure of 3 mmHg.   Lexiscan  Myoview  01/06/2022   No ST deviation was noted.   Left ventricular function is normal. Nuclear stress EF: 62 %. The left ventricular ejection fraction is normal (55-65%). End diastolic cavity size is normal. End systolic cavity size is normal.   Prior study available for comparison from 04/18/2011.  Cardiac catheterization 03/01/2020 Patent left internal mammary graft to the LAD. Diffusely diseased but patent saphenous vein graft to the ramus intermedius. Total occlusion of the mid  and distal segments of the saphenous vein graft to the PDA.  PDA and other left ventricular branches of the right coronary regular supplied by left to right collaterals. Patent but diffusely diseased native left main. Functional occlusion of the left anterior descending after the origin of the first diagonal.  The segment from the ostial LAD into the diagonal is diffusely diseased up to 60 to 70%. Patent circumflex artery that includes a single obtuse marginal.  Ostial to mid vessel luminal irregularities with eccentric stenoses up to 60 to 70%.  Unchanged from prior imaging. Inferobasal akinesis.  EF 40%.  LVEDP 14 mmHg on IV nitroglycerin  Diagnostic Dominance: Right  Risk Assessment/Calculations:            Physical Exam:   VS:  BP 114/60 (BP Location: Right Arm, Patient Position: Sitting, Cuff Size: Normal)   Pulse 65   Ht 5' 5 (1.651 m)   Wt 183 lb 9.6 oz (83.3 kg)   LMP 11/16/2011   SpO2 96%   BMI 30.55 kg/m    Wt Readings from Last 3 Encounters:  03/20/24 183 lb 9.6 oz (83.3 kg)  02/27/24 190 lb (86.2 kg)  01/28/24 196 lb 3.4 oz (89 kg)    GEN: Well nourished, well developed in no acute distress NECK: No JVD; No carotid bruits CARDIAC: RRR, no murmurs, rubs, gallops RESPIRATORY:  Clear to auscultation without rales, wheezing or rhonchi  ABDOMEN: Soft, non-tender, non-distended EXTREMITIES:  No edema; No acute deformity      Assessment and Plan:  Coronary artery disease S/p RCA stenting in the remote past, in 06/2007. She had bypass surgery with LIMA to her LAD, vein to ramus branch and PDA.  Catheterization in 2011 revealed patent grafts and normal LV function.  LHC 01/2016 revealing unchanged anatomy.  In 05/2019 she was hospitalized with unstable angina, cardiac catheterization revealed high-grade distal PDA SVG stenosis which was stented, this was complicated by no reflow requiring additional stenting and Aggrastat .  In 02/2020 she was readmitted with non-STEMI and  underwent diagnostic cath revealing occluded RCA vein graft that had previously been stented with left-to-right collaterals, medical therapy was recommended.  Myoview  in 12/2021 was low risk with minimal inferior scar EKG today without acute ischemic changes - She continues to report daily episodes of exertional angina.  Currently taking around 3 nitroglycerin  weekly that seem to resolve her pain.  Did have 1 episode last week with severe chest pain with associated nausea/diaphoresis that felt different from her chronic angina. - She is also pending endoscopy/colonoscopy for further evaluation of her gastric ulcers, erosive gastritis, and iron deficiency anemia and will need cardiac clearance - Plan for Lexiscan  Myoview  for further ischemic evaluation prior to cardiac clearance - Continue Imdur  120 mg daily, metoprolol  tartrate 12.5 mg twice daily, amlodipine  5 mg daily, ranolazine  100 mg twice daily - Continue aspirin  81 mg daily and clopidogrel  75 mg daily chronically - Continue atorvastatin   80 mg daily and Praluent   Hypertension Blood pressure today is 114/60 and well-controlled - Continue current antihypertensive regimen  Obstructive sleep apnea - Patient with history of intolerance to CPAP and now with inspire device  Hyperlipidemia, LDL goal <55 LDL 47 and TG 185 on 09/2022 - Plan for fasting lipid panel/LFTs - Continue Praluent , Vascepa  2 g twice daily, and atorvastatin  80 mg daily  Preoperative cardiovascular clearance Colonoscopy and endoscopy on 04/29/2024  According to the Revised Cardiac Risk Index (RCRI), her Perioperative Risk of Major Cardiac Event is (%): 0.9. Her Functional Capacity in METs is: 6.36 according to the Duke Activity Status Index (DASI).  Per Dr. Court okay for 5-day Plavix .  She may hold Plavix  for 5 days prior to procedure. Please resume Plavix  as soon as possible postprocedure, at the discretion of the surgeon.    Patient will require Lexiscan  Myoview  for  further evaluation prior to cardiac clearance for upcoming procedure.    Informed Consent   Shared Decision Making/Informed Consent The risks [chest pain, shortness of breath, cardiac arrhythmias, dizziness, blood pressure fluctuations, myocardial infarction, stroke/transient ischemic attack, nausea, vomiting, allergic reaction, radiation exposure, metallic taste sensation and life-threatening complications (estimated to be 1 in 10,000)], benefits (risk stratification, diagnosing coronary artery disease, treatment guidance) and alternatives of a nuclear stress test were discussed in detail with Ms. Daoud and she agrees to proceed.     Dispo:  Return in about 6 weeks (around 05/01/2024).  Signed, Lum LITTIE Louis, NP

## 2024-03-20 NOTE — Patient Instructions (Signed)
 Medication Instructions:  INCREASE YOUR IMDUR  TO 120 MG DAILY. INCREASE YOUR RANEXA  TO 1000 MG TWICE DAILY.   Lab Work: CMET AND FASTING LIPID PANEL TO BE DONE TODAY.   Testing/Procedures: Your physician has requested that you have a lexiscan  myoview . For further information please visit [email or web address] Please follow instruction sheet, as given.   Follow-Up: At [facility], you and your health needs are our priority.  As part of our continuing mission to provide you with exceptional heart care, our providers are all part of one team.  This team includes your primary Cardiologist (physician) and Advanced Practice Providers or APPs (Physician Assistants and Nurse Practitioners) who all work together to provide you with the care you need, when you need it.  Your next appointment:   6-8 WEEKS  Provider:   Dorn Lesches, MD (ONLY)

## 2024-03-21 NOTE — Telephone Encounter (Signed)
 Patient's 7/16 appt was cancelled and she actually saw cardiology 03/20/2024.  According to the office note they reached out to Dr. Court to get clearance for Plavix 

## 2024-03-28 ENCOUNTER — Ambulatory Visit: Payer: Self-pay | Admitting: Cardiology

## 2024-03-28 ENCOUNTER — Encounter: Payer: Self-pay | Admitting: Nurse Practitioner

## 2024-03-28 ENCOUNTER — Encounter: Admitting: Nurse Practitioner

## 2024-03-28 LAB — COMPREHENSIVE METABOLIC PANEL WITH GFR
ALT: 15 IU/L (ref 0–32)
AST: 21 IU/L (ref 0–40)
Albumin: 4 g/dL (ref 3.9–4.9)
Alkaline Phosphatase: 176 IU/L — ABNORMAL HIGH (ref 44–121)
BUN/Creatinine Ratio: 10 — ABNORMAL LOW (ref 12–28)
BUN: 9 mg/dL (ref 8–27)
Bilirubin Total: 0.3 mg/dL (ref 0.0–1.2)
CO2: 22 mmol/L (ref 20–29)
Calcium: 9 mg/dL (ref 8.7–10.3)
Chloride: 105 mmol/L (ref 96–106)
Creatinine, Ser: 0.86 mg/dL (ref 0.57–1.00)
Globulin, Total: 2.5 g/dL (ref 1.5–4.5)
Glucose: 111 mg/dL — ABNORMAL HIGH (ref 70–99)
Potassium: 4 mmol/L (ref 3.5–5.2)
Sodium: 143 mmol/L (ref 134–144)
Total Protein: 6.5 g/dL (ref 6.0–8.5)
eGFR: 76 mL/min/1.73 (ref >59)

## 2024-03-28 LAB — LIPID PANEL
Chol/HDL Ratio: 2.8 ratio (ref 0.0–4.4)
Cholesterol, Total: 135 mg/dL (ref 100–199)
HDL: 48 mg/dL (ref >39)
LDL Chol Calc (NIH): 64 mg/dL (ref 0–99)
Triglycerides: 131 mg/dL (ref 0–149)
VLDL Cholesterol Cal: 23 mg/dL (ref 5–40)

## 2024-03-31 ENCOUNTER — Other Ambulatory Visit: Payer: Self-pay | Admitting: Emergency Medicine

## 2024-03-31 DIAGNOSIS — E7800 Pure hypercholesterolemia, unspecified: Secondary | ICD-10-CM

## 2024-03-31 DIAGNOSIS — I25709 Atherosclerosis of coronary artery bypass graft(s), unspecified, with unspecified angina pectoris: Secondary | ICD-10-CM

## 2024-04-03 ENCOUNTER — Ambulatory Visit (HOSPITAL_COMMUNITY)
Admission: RE | Admit: 2024-04-03 | Discharge: 2024-04-03 | Disposition: A | Discharge status: Home / Self Care | Source: Ambulatory Visit | Attending: Cardiology | Admitting: Cardiology

## 2024-04-03 DIAGNOSIS — E7800 Pure hypercholesterolemia, unspecified: Secondary | ICD-10-CM | POA: Insufficient documentation

## 2024-04-03 DIAGNOSIS — I25709 Atherosclerosis of coronary artery bypass graft(s), unspecified, with unspecified angina pectoris: Secondary | ICD-10-CM | POA: Diagnosis present

## 2024-04-03 LAB — MYOCARDIAL PERFUSION IMAGING
LV dias vol: 155 mL (ref 46–106)
LV sys vol: 62 mL (ref 3.8–5.2)
Nuc Stress EF: 60 %
Peak HR: 80 {beats}/min
Rest HR: 61 {beats}/min
Rest Nuclear Isotope Dose: 10.9 mCi
SDS: 2
SRS: 3
SSS: 5
ST Depression (mm): 0 mm
Stress Nuclear Isotope Dose: 32.7 mCi
TID: 1.2

## 2024-04-03 MED ORDER — TECHNETIUM TC 99M TETROFOSMIN IV KIT
32.7000 | PACK | Freq: Once | INTRAVENOUS | Status: AC | PRN
  Administered 2024-04-03: 32.7 via INTRAVENOUS

## 2024-04-03 MED ORDER — REGADENOSON 0.4 MG/5ML IV SOLN
0.4000 mg | Freq: Once | INTRAVENOUS | Status: AC
  Administered 2024-04-03: 0.4 mg via INTRAVENOUS

## 2024-04-03 MED ORDER — REGADENOSON 0.4 MG/5ML IV SOLN
INTRAVENOUS | Status: AC
  Filled 2024-04-03: qty 5

## 2024-04-03 MED ORDER — TECHNETIUM TC 99M TETROFOSMIN IV KIT
10.9000 | PACK | Freq: Once | INTRAVENOUS | Status: AC | PRN
  Administered 2024-04-03: 10.9 via INTRAVENOUS

## 2024-04-07 MED ORDER — CLOPIDOGREL BISULFATE 75 MG PO TABS: 75.0000 mg | ORAL_TABLET | Freq: Every day | ORAL | 3 refills | Status: AC

## 2024-04-10 ENCOUNTER — Ambulatory Visit: Admitting: Nurse Practitioner

## 2024-04-15 NOTE — Telephone Encounter (Signed)
   Patient Name: [name]  DOB: 07/03/1961 MRN: [id number]  Primary Cardiologist: Dorn Lesches, MD  Chart reviewed as part of pre-operative protocol coverage. Given past medical history and time since last visit, based on ACC/AHA guidelines, [name] is at acceptable risk for the planned procedure without further cardiovascular testing.   Patient seen and evaluated in clinic on 03/20/2024 for preoperative clearance.  Lexiscan  Myoview  stress test was ordered and completed on 04/03/2024 that was consistent with prior infarction and no active ischemia.  The study was low risk.  Therefore, based on ACC/AHA guidelines, patient would be at acceptable risk for the planned procedure without further cardiovascular testing.  Per Dr. Lesches okay for 5-day Plavix  hold.  She may hold Plavix  for 5 days prior to procedure. Please resume Plavix  as soon as possible postprocedure, at the discretion of the surgeon.     I will route this recommendation to the requesting party via Epic fax function and remove from pre-op pool.  Please call with questions.  Lum LITTIE Louis, NP 04/15/2024, 8:09 AM

## 2024-04-25 ENCOUNTER — Telehealth: Payer: Self-pay | Admitting: Cardiovascular Disease

## 2024-04-25 NOTE — Telephone Encounter (Signed)
*  STAT* If patient is at the pharmacy, call can be transferred to refill team.   1. Which medications need to be refilled? (please list name of each medication and dose if known)   clopidogrel  (PLAVIX ) 75 MG tablet     2. Would you like to learn more about the convenience, safety, & potential cost savings by using the [facility]? No   3. Are you open to using the [facility] (Type [facility]. No   4. Which pharmacy/location (including street and city if local pharmacy) is medication to be sent to?[id number] - [place], [place] - [address]    5. Do they need a 30 day or 90 day supply? 90 day  Patient is out of medication

## 2024-04-25 NOTE — Telephone Encounter (Signed)
 Already filled

## 2024-04-25 NOTE — Telephone Encounter (Signed)
 Rx filled 04/07/24

## 2024-04-29 ENCOUNTER — Encounter: Admitting: Gastroenterology

## 2024-04-30 ENCOUNTER — Ambulatory Visit: Attending: Cardiovascular Disease | Admitting: Cardiovascular Disease

## 2024-04-30 ENCOUNTER — Encounter: Payer: Self-pay | Admitting: *Deleted

## 2024-04-30 ENCOUNTER — Encounter: Payer: Self-pay | Admitting: Cardiovascular Disease

## 2024-04-30 VITALS — BP 124/78 | HR 60 | Ht 65.0 in | Wt 190.0 lb

## 2024-04-30 DIAGNOSIS — I10 Essential (primary) hypertension: Secondary | ICD-10-CM | POA: Insufficient documentation

## 2024-04-30 DIAGNOSIS — G4733 Obstructive sleep apnea (adult) (pediatric): Secondary | ICD-10-CM | POA: Insufficient documentation

## 2024-04-30 DIAGNOSIS — E785 Hyperlipidemia, unspecified: Secondary | ICD-10-CM | POA: Insufficient documentation

## 2024-04-30 DIAGNOSIS — I25709 Atherosclerosis of coronary artery bypass graft(s), unspecified, with unspecified angina pectoris: Secondary | ICD-10-CM | POA: Insufficient documentation

## 2024-04-30 NOTE — Assessment & Plan Note (Signed)
 History of obstructive sleep apnea status post inspire device implantation by her pulmonologist 5/25 with beneficial effects.

## 2024-04-30 NOTE — Assessment & Plan Note (Signed)
 History of CAD status post RCA stenting by Dr. Claudene in the past.  She ultimately went to CABG November 2008 with a LIMA to LAD, vein to ramus branch and PDA.  I catheterized her 06/23/2019 revealing a high-grade distal PDA SVG stenosis which I stented.  This was complicated by no reflow requiring additional stenting and Aggrastat .  Her last cath in the setting of a non-STEMI was performed by Dr. Claudene 03/05/2020 revealing an occluded RCA vein graft, and occluded RCA with left-to-right collaterals.  She recently had a Myoview  stress test performed 04/03/2024 that showed inferior scar without ischemia consistent with her anatomy.  She is on maximum antianginal medications including calcium  channel blocker, beta-blocker, long-acting nitrate and ranolazine .  She does get frequent nitrate responsive angina.

## 2024-04-30 NOTE — Patient Instructions (Signed)
 Medication Instructions:  Your physician recommends that you continue on your current medications as directed. Please refer to the Current Medication list given to you today.  *If you need a refill on your cardiac medications before your next appointment, please call your pharmacy*   Follow-Up: At [facility], you and your health needs are our priority.  As part of our continuing mission to provide you with exceptional heart care, our providers are all part of one team.  This team includes your primary Cardiologist (physician) and Advanced Practice Providers or APPs (Physician Assistants and Nurse Practitioners) who all work together to provide you with the care you need, when you need it.  Your next appointment:   6 month(s)  Provider:   Lum Louis, NP         Then, Dorn Lesches, MD will plan to see you again in 12 month(s).

## 2024-04-30 NOTE — Assessment & Plan Note (Signed)
 History of essential hypertension with blood pressure measured today 124/78.  She is on amlodipine , metoprolol .

## 2024-04-30 NOTE — Progress Notes (Signed)
 04/30/2024 [name]   September 24, 1960  [id number]  Primary Physician Rena, Luke POUR, MD Primary Cardiologist: Dorn JINNY Lesches MD FACP, [place], [place], [place]  HPI:  [name] is a 63 y.o.  severely overweight divorced Caucasian female mother of 2, grandmother and 3 grandchildren who I last saw  in the office 10/23/23.  Her mother, [name] , is also patient of mine. She is currently in school getting her social workers degree an [employer].She has a history of RCA stenting by Dr. Dr. Claudene in the past. She had coronary artery bypass grafting November 2008 with LIMA to LAD, vein to ramus branch and PDA. She recently stopped smoking 4 months ago. Her palms include hypertension, hyperlipidemia and obstructive sleep apnea intolerant to see. I catheterized her 03/04/10 revealing patent grafts and normal LV function. Her last  Myoview  performed 04/18/11 with nonischemic. She was experiencing a lot of stress in her life and developed chest pain. She saw Katheryn Jude, nurse practitioner on 02/11/16. At that time, she was up for cardiac catheterization which was performed 4 days later by Dr. Peter [place]. He demonstrated widely patent grafts, normal LV function and unchanged anatomy compared to her cath performed in 2011.   She was admitted to the hospital with unstable angina 06/20/2019 and discharged 5 days later.  I performed cardiac catheterization on her 06/23/2019 revealing a high-grade distal PDA SVG stenosis which I stented.  She is complicated by no reflow requiring additional stenting and Aggrastat .    She has stopped smoking.   Unfortunately she was readmitted to the hospital with a non-STEMI 03/05/2020 underwent diagnostic coronary angiography by Dr. Esmeralda Sharps revealing occluded RCA vein graft, the vessel which I had stented in November of last year.  She did have left-to-right collaterals.  Echo showed normal LV function.  Her medicines were adjusted, her ranolazine  was increased.   She  had a Myoview  performed 01/06/2022 which was low risk with minimal inferior scar.  She is on multiple antianginal medications including long-acting nitrate, Ranexa , beta-blocker and amlodipine  with a known occluded RCA, PDA vein graft and left-to-right collaterals.  She does get occasional chest and arm pain.   Since I saw her in the office 6 months ago she was seen in the hospital by Dr. Floretta in June for chest pain but left AMA.  Her troponins were negative.  She has had an inspire device implanted 5/25 for sleep apnea.  She does get fairly frequent nitro responsive chest pain.  She had a Myoview  stress test performed 04/03/2024 that showed inferior scar without ischemia consistent with her known anatomy.  She also has symptomatic anemia which is being addressed by her primary care provider.   Current Meds  Medication Sig   albuterol  (PROVENTIL  HFA;VENTOLIN  HFA) 108 (90 BASE) MCG/ACT inhaler Inhale 2 puffs into the lungs every 6 (six) hours as needed for wheezing or shortness of breath.    Alirocumab  (PRALUENT ) 150 MG/ML SOAJ Inject 1 mL (150 mg total) into the skin every 14 (fourteen) days.   amLODipine  (NORVASC ) 5 MG tablet Take 1 tablet (5 mg total) by mouth daily.   aspirin  EC 81 MG tablet Take 1 tablet (81 mg total) by mouth daily. Swallow whole.   atorvastatin  (LIPITOR ) 80 MG tablet Take 1 tablet (80 mg total) by mouth daily.   brimonidine  (ALPHAGAN ) 0.15 % ophthalmic solution Place 1 drop into both eyes 2 (two) times daily.    Budeson-Glycopyrrol-Formoterol  (BREZTRI  AEROSPHERE) 160-9-4.8 MCG/ACT  AERO Inhale 2 puffs into the lungs in the morning and at bedtime.   clopidogrel  (PLAVIX ) 75 MG tablet Take 1 tablet (75 mg total) by mouth daily.   escitalopram  (LEXAPRO ) 20 MG tablet Take 20 mg by mouth daily.   ferrous sulfate  325 (65 FE) MG EC tablet Take 325 mg by mouth daily with breakfast.   fluticasone  (FLONASE ) 50 MCG/ACT nasal spray Place 2 sprays into both nostrils daily.   icosapent   Ethyl (VASCEPA ) 1 g capsule TAKE 2 CAPSULES BY MOUTH 2 TIMES DAILY.   isosorbide  mononitrate (IMDUR ) 120 MG 24 hr tablet Take 1 tablet (120 mg total) by mouth daily.   lamoTRIgine  (LAMICTAL ) 100 MG tablet Take 100 mg by mouth daily.   metoprolol  tartrate (LOPRESSOR ) 25 MG tablet Take 0.5 tablets (12.5 mg total) by mouth 2 (two) times daily.   niacin  (NIASPAN ) 1000 MG CR tablet TAKE 1 TABLET (1,000 MG TOTAL) BY MOUTH AT BEDTIME.   nitroGLYCERIN  (NITROSTAT ) 0.4 MG SL tablet Place 1 tablet (0.4 mg total) under the tongue every 5 (five) minutes as needed for chest pain.   ondansetron  (ZOFRAN -ODT) 4 MG disintegrating tablet Take 1 tablet (4 mg total) by mouth every 8 (eight) hours as needed for nausea or vomiting.   pantoprazole  (PROTONIX ) 40 MG tablet Take 1 tablet (40 mg total) by mouth 2 (two) times daily.   QUEtiapine  Fumarate 150 MG TABS Take 150 mg by mouth at bedtime.   ranolazine  (RANEXA ) 1000 MG SR tablet Take 1 tablet (1,000 mg total) by mouth 2 (two) times daily.   sucralfate  (CARAFATE ) 1 g tablet Take 1 tablet (1 g total) by mouth 4 (four) times daily -  with meals and at bedtime. Carafate  1 g 4 times daily x 2 weeks and  then twice daily for 1 month. Then may stop.   Vitamin D , Ergocalciferol , (DRISDOL) 1.25 MG (50000 UNIT) CAPS capsule Take 50,000 Units by mouth every 7 (seven) days.   VITAMIN E PO Take 1 tablet by mouth daily.     Allergies  Allergen Reactions   Benadryl  [Diphenhydramine  Hcl] Shortness Of Breath and Swelling    Tongue swelling   Codeine Other (See Comments)    Alters mood   Haldol [Haloperidol Decanoate] Anaphylaxis   Amoxicillin Hives and Swelling    Swelling to extremities Did it involve swelling of the face/tongue/throat, SOB, or low BP? Yes Did it involve sudden or severe rash/hives, skin peeling, or any reaction on the inside of your mouth or nose? Yes Did you need to seek medical attention at a hospital or doctor's office? Yes When did it last happen?     young adult   If all above answers are "NO", may proceed with cephalosporin use.   Bee Venom Swelling and Other (See Comments)    pimples from head to toe and whole body swelling    Social History   Socioeconomic History   Marital status: Divorced    Spouse name: Not on file   Number of children: Not on file   Years of education: Not on file   Highest education level: Not on file  Occupational History   Not on file  Tobacco Use   Smoking status: Some Days    Current packs/day: 2.00    Average packs/day: 2.0 packs/day for 38.7 years (77.4 ttl pk-yrs)    Types: Cigarettes    Start date: 08/21/1985   Smokeless tobacco: Never   Tobacco comments:    10/23/2023 patient Walsh a pack of  cigarettes a month    1 cigarette when she feels like it  Vaping Use   Vaping status: Never Used  Substance and Sexual Activity   Alcohol use: No   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   Sexual activity: Not on file  Other Topics Concern   Not on file  Social History Narrative   Not on file   Social Drivers of Health   Financial Resource Strain: Low Risk  (01/15/2024)   Received from [facility]   Overall Financial Resource Strain (CARDIA)    Difficulty of Paying Living Expenses: Not very hard  Food Insecurity: Food Insecurity Present (01/15/2024)   Received from [facility]   Hunger Vital Sign    Within the past 12 months, you worried that your food would run out before you got the money to buy more.: Never true    Within the past 12 months, the food you bought just didn't last and you didn't have money to get more.: Sometimes true  Transportation Needs: Unmet Transportation Needs (01/15/2024)   Received from [facility] - Transportation    Lack of Transportation (Medical): Yes    Lack of Transportation (Non-Medical): No  Physical Activity: Insufficiently Active (01/15/2024)   [facility]   Exercise Vital Sign    On average, how many days per  week do you engage in moderate to strenuous exercise (like a brisk walk)?: 1 day    On average, how many minutes do you engage in exercise at this level?: 30 min  Stress: Stress Concern Present (01/15/2024)   Received from [facility] - Occupational Stress Questionnaire    Feeling of Stress : To some extent  Social Connections: Somewhat Isolated (01/15/2024)   Received from [facility]   Social Network    How would you rate your social network (family, work, friends)?: Restricted participation with some degree of social isolation  Intimate Partner Violence: Not At Risk (01/15/2024)   Received from [facility]   HITS    Over the last 12 months how often did your partner physically hurt you?: Never    Over the last 12 months how often did your partner insult you or talk down to you?: Never    Over the last 12 months how often did your partner threaten you with physical harm?: Never    Over the last 12 months how often did your partner scream or curse at you?: Never     Review of Systems: General: negative for chills, fever, night sweats or weight changes.  Cardiovascular: negative for chest pain, dyspnea on exertion, edema, orthopnea, palpitations, paroxysmal nocturnal dyspnea or shortness of breath Dermatological: negative for rash Respiratory: negative for cough or wheezing Urologic: negative for hematuria Abdominal: negative for nausea, vomiting, diarrhea, bright red blood per rectum, melena, or hematemesis Neurologic: negative for visual changes, syncope, or dizziness All other systems reviewed and are otherwise negative except as noted above.    Blood pressure 124/78, pulse 60, height 5' 5 (1.651 m), weight 190 lb (86.2 kg), last menstrual period 11/16/2011, SpO2 98%.  General appearance: alert and no distress Neck: no adenopathy, no carotid bruit, no JVD, supple, symmetrical, trachea midline, and thyroid  not enlarged, symmetric, no  tenderness/mass/nodules Lungs: clear to auscultation bilaterally Heart: regular rate and rhythm, S1, S2 normal, no murmur, click, rub or gallop Extremities: extremities normal, atraumatic, no cyanosis or edema Pulses:  2+ and symmetric Skin: Skin color, texture, turgor normal. No rashes or lesions Neurologic: Grossly normal  EKG not performed today      ASSESSMENT AND PLAN:   Hyperlipidemia LDL goal <70 History of hyperlipidemia on PCSK9 and high-dose statin therapy with lipid profile performed 03/27/2024 revealing total cholesterol 135, LDL 64 HDL 48.  Coronary artery disease involving coronary bypass graft History of CAD status post RCA stenting by Dr. Claudene in the past.  She ultimately went to CABG November 2008 with a LIMA to LAD, vein to ramus branch and PDA.  I catheterized her 06/23/2019 revealing a high-grade distal PDA SVG stenosis which I stented.  This was complicated by no reflow requiring additional stenting and Aggrastat .  Her last cath in the setting of a non-STEMI was performed by Dr. Claudene 03/05/2020 revealing an occluded RCA vein graft, and occluded RCA with left-to-right collaterals.  She recently had a Myoview  stress test performed 04/03/2024 that showed inferior scar without ischemia consistent with her anatomy.  She is on maximum antianginal medications including calcium  channel blocker, beta-blocker, long-acting nitrate and ranolazine .  She does get frequent nitrate responsive angina.  Essential hypertension History of essential hypertension with blood pressure measured today 124/78.  She is on amlodipine , metoprolol .  OSA (obstructive sleep apnea) History of obstructive sleep apnea status post inspire device implantation by her pulmonologist 5/25 with beneficial effects.     Dorn DOROTHA Lesches MD FACP,FACC,FAHA, [facility] 04/30/2024 1:43 PM

## 2024-04-30 NOTE — Assessment & Plan Note (Addendum)
 History of hyperlipidemia on PCSK9 and high-dose statin therapy with lipid profile performed 03/27/2024 revealing total cholesterol 135, LDL 64 HDL 48.

## 2024-05-01 ENCOUNTER — Telehealth: Payer: Self-pay | Admitting: *Deleted

## 2024-05-01 ENCOUNTER — Other Ambulatory Visit: Payer: Self-pay | Admitting: Cardiovascular Disease

## 2024-05-01 DIAGNOSIS — I10 Essential (primary) hypertension: Secondary | ICD-10-CM

## 2024-05-01 DIAGNOSIS — E785 Hyperlipidemia, unspecified: Secondary | ICD-10-CM

## 2024-05-01 MED ORDER — NA SULFATE-K SULFATE-MG SULF 17.5-3.13-1.6 GM/177ML PO SOLN: 1.0000 | Freq: Once | ORAL | 0 refills | Status: AC

## 2024-05-01 NOTE — Telephone Encounter (Signed)
 Called pt to verify that she knew to hold Plavix  for 5 days prior to EGD/colonoscopy. She reports that she did know. She took a dose this morning, but will not take again until instructed after the procedure. During call, pt stated that she did not have a prescription sent for the bowel prep. Confirmed pharmacy and sent prescription for Suprep.

## 2024-05-02 ENCOUNTER — Encounter: Payer: Self-pay | Admitting: Nurse Practitioner

## 2024-05-02 ENCOUNTER — Ambulatory Visit (INDEPENDENT_AMBULATORY_CARE_PROVIDER_SITE_OTHER): Admitting: Nurse Practitioner

## 2024-05-02 VITALS — BP 134/82 | HR 49 | Temp 98.6°F | Ht 65.5 in | Wt 186.0 lb

## 2024-05-02 DIAGNOSIS — J4489 Other specified chronic obstructive pulmonary disease: Secondary | ICD-10-CM | POA: Diagnosis not present

## 2024-05-02 DIAGNOSIS — G4733 Obstructive sleep apnea (adult) (pediatric): Secondary | ICD-10-CM | POA: Diagnosis not present

## 2024-05-02 DIAGNOSIS — J439 Emphysema, unspecified: Secondary | ICD-10-CM

## 2024-05-02 DIAGNOSIS — F17210 Nicotine dependence, cigarettes, uncomplicated: Secondary | ICD-10-CM

## 2024-05-02 NOTE — Assessment & Plan Note (Signed)
 Stable without any recent exacerbations.  Continue triple therapy regimen.  Action plan in place.

## 2024-05-02 NOTE — Patient Instructions (Addendum)
 Stepped up to level 5 (1.8 V). You will increase next Friday, 9/19 to level 6 and then every Friday by 1 level after this. If you have any discomfort, you will back down by 1 level, stay there for a week then try to increase again. If you still are uncomfortable on the level, go back down by 1 level and stay there until your sleep study. Do not increase faster than once a week I also changed your duration on your Inspire to 9 hours since you have some nights you're hitting 8 hours   Inspire titration study ordered today   Follow up 10/24 at 3 pm video visit for pre titration check inand then 11/21 at 1130 with Katie Dorothee Napierkowski,NP to review Inspire titration. If symptoms do not improve or worsen, please contact office for sooner follow up

## 2024-05-02 NOTE — Assessment & Plan Note (Addendum)
 Severe OSA, intolerant of CPAP.  Inspire implanted 10/10/2023.  Delayed activation due to ongoing nerve healing; activation date 01/04/2024.  Tolerating well. Stepping up as appropriate for the most part. Adjusted to level 5, 1.8 V today. No discomfort and comfortable tongue protrusion with slight deviation to the left on exam. Waveform appropriate. Re-educated to increase by 1 level weekly, as long as there is no discomfort. Verbalized understanding. Adjusted duration to 9 hr as there are some nights she is sleeping between 8-9 hours. Aware to turn off in AM when awakening. Receiving benefit from use. Part of her difficulties are related to chronic insomnia/mania related to bipolar disorder. Encouraged to continue management as per psychiatry. Understands remote use.  Understands to utilize nightly or anytime that she is napping.  Aware of risks of untreated sleep apnea.  Safe driving practices reviewed.  Patient Instructions  Stepped up to level 5 (1.8 V). You will increase next Friday, 9/19 to level 6 and then every Friday by 1 level after this. If you have any discomfort, you will back down by 1 level, stay there for a week then try to increase again. If you still are uncomfortable on the level, go back down by 1 level and stay there until your sleep study. Do not increase faster than once a week I also changed your duration on your Inspire to 9 hours since you have some nights you're hitting 8 hours   Inspire titration study ordered today   Follow up 10/24 at 3 pm video visit for pre titration check inand then 11/21 at 1130 with Angela Annarae Macnair,NP to review Inspire titration. If symptoms do not improve or worsen, please contact office for sooner follow up

## 2024-05-02 NOTE — Progress Notes (Signed)
 @Patient  ID: [name], female    DOB: 1961/06/20, 63 y.o.   MRN: [id number]  Chief Complaint  Patient presents with   Follow-up    Inspire patient    Referring provider: Rena Luke POUR, MD  HPI: 63 year old female, active smoker followed for COPD with chronic bronchitis and emphysema, and severe OSA treated with Inspire. She was last seen in office 01/04/2024. Past medical history significant for CAD s/p CABG, HTN, hx of NSTEMI, HLD, bipolar disorder, severe OSA, GERD.  TEST/EVENTS:  08/25/2007 PSG: AHI 20.6/h >> optimal CPAP pressure 7 cmH2O 10/14/2022 HST: AHI 33.2/h, SpO2 low 80% 06/11/2023 PFT: FVC 87, FEV1 71, ratio 69, TLC 102, DLCOcor 97. Moderate obstruction with reversibility   03/22/2022: OV with Dr. McQuaid. Coughing up white mucus for years; no change. Recently had a flare up after respiratory infection following exposure to sick kids. Treated with medications but can't remember the names. DOE most of her adult life. Daytime fatigue; snores a lot. Advanced cardiac disease, CAD. Previous CABG. Spirometry showed moderate airflow obstruction. Still smoking, likely contributing to symptoms. Noncompliant with Breo. Will switch to Anoro. Smoking cessation advised. Had CT outside - will request records. Split night sleep study ordered. Follow up 1 week.  05/02/2023: OV with Ahaana Rochette NP for overdue follow up. Her breathing has been about the same since she was here last. No worse but no better. Not currently on any scheduled inhalers. She wasn't sure the Anoro really helped her. Doesn't remember much about her response to Breo. She does use her albuterol  daily. No recent exacerbations requiring steroids/abx. She has a daily cough with white phlegm, which is unchanged. Notices an occasional wheeze. She does struggle with reflux. She has not been on anything recently. She has this regardless of what she eats but definitely avoids certain trigger foods. Denies fevers, chills, hemoptysis, leg  swelling, orthopnea, anorexia, weight loss. No difficulties swallowing, abd pain, changes in bowel habits, hoarse voice. She is seeing an ENT for chronic sinusitis symptoms. No worse than normal.  She has had a repeat sleep study since she was here last. She has severe sleep apnea with AHI 33.2/h from [facility] 10/14/2022. She has significant daytime fatigue and restless sleep at night. Never feels rested. She was originally diagnosed with OSA in 2009. She was on CPAP but could not tolerate it even after a CPAP desensitization study. She tried multiple masks and setting adjustments. She has PTSD and is just unable to wear something on her face at night. She feels like her symptoms have gotten worse over the past few years. She is interested in [place] device.   06/19/2023: OV with Dr. Neda.  Recently started on Breztri .  Symptoms are much better with Breztri  use.  Has not used albuterol  in the last month.  Very active.  Down to smoking about a pack a month.  Has a history of OSA, awaiting inspire device implantation.  Overall doing well.  01/04/2024: OV with Niclas Markell,NP Patient presents today for inspire activation.  She had come in in March, 6 weeks postop, but implantation was delayed due to ongoing nerve healing.  Her inspire was implanted 10/10/2023 by Dr. Soldatova.  Since the end of March, she has had significant improvement.  Not having any more pain or tenderness to the right side.  Still does have some tightness when she moves her neck a certain way or stretches her tongue up to her nose but no discomfort.  Swallowing and speech are normal.  Incision sites have healed well. She continues to have ongoing issues with her sleep.  She has never felt rested.  Has significant daytime fatigue and restless sleep.  Has been told that she snores in the past.  Denies any drowsy driving or morning headaches.  No sleep parasomnia/paralysis. Breathing stable.  Still using Breztri .  No increased cough or wheezing. [name]  with Inspire present  05/02/2024: Today - follow up Patient presents today for inspire titration follow up. She was supposed to be seen last month but when she came to her visit, she had not increased her levels on her Inspire at all so her appt was rescheduled. She has since been stepping up her therapy by 1 level each week, although she skipped last week as she had increased too quickly prior to this. She's currently on 1.7 V, level 4. No discomfort or issues with therapy. Her sleep is sometimes fragmented, due to insomnia and bioplar/mania symptoms; however, when she does sleep, it's much more restful with her [name] and she feels like she's seeing an overall benefit in her energy levels with it as well. She has slurred speech and feels her tongue is a little swollen when she wakes up in the mornings, but this is when the device is still on and as soon as she turns it off, symptoms resolve. No residual swelling. No neck discomfort or changes in speech. No drowsy driving. She has no concerns or complaints today. Her breathing is stable. Still using Breztri . No increased cough or wheezing. No exacerbations.  04/02/2024-05/01/2024: Inspire Download Electrode A, 90/33 Delay 60 min, pause 15 min, duration 6 hr  1.4-2.4 V, current 1.7 V (Level 4) Avg use 6 hr 28 min, some nights as long as 8.5h 28/30 days; 73% >4 hr Average pause 0.3/night   Allergies  Allergen Reactions   Benadryl  [Diphenhydramine  Hcl] Shortness Of Breath and Swelling    Tongue swelling   Codeine Other (See Comments)    Alters mood   Haldol [Haloperidol Decanoate] Anaphylaxis   Amoxicillin Hives and Swelling    Swelling to extremities Did it involve swelling of the face/tongue/throat, SOB, or low BP? Yes Did it involve sudden or severe rash/hives, skin peeling, or any reaction on the inside of your mouth or nose? Yes Did you need to seek medical attention at a hospital or doctor's office? Yes When did it last happen?     young adult   If all above answers are "NO", may proceed with cephalosporin use.   Bee Venom Swelling and Other (See Comments)    pimples from head to toe and whole body swelling    Immunization History  Administered Date(s) Administered   Influenza, Seasonal, Injecte, Preservative Fre 06/19/2023   PNEUMOCOCCAL CONJUGATE-20 04/27/2023    Past Medical History:  Diagnosis Date   Allergy    Anemia    takes iron supplement   Arthritis    knees, shoulders   Asthma    Bipolar 1 disorder (HCC)    Blood transfusion without reported diagnosis    Cataract, immature    CHF (congestive heart failure) (HCC)    Clotting disorder (HCC)    she denied any specific genetic bleeding/clotting diagnosis (10/05/23)   COPD (chronic obstructive pulmonary disease) (HCC)    Coronary artery disease    triple vessel   Depression    Dyspnea    Family history of adverse reaction to anesthesia    states father did not do well with anesthesia; died last time  he had surgery; also had heart disease   Gallstones    GERD (gastroesophageal reflux disease)    occasional - no current med.   Glaucoma    Hammertoe 05/2015   right second toe   Heart murmur    Hepatic steatosis    High cholesterol    History of MI (myocardial infarction)    Hypertension    states under control with meds., has been on med. x 8 yr.   Myocardial infarction (HCC)    Osteoporosis    Panic attacks    Peripheral vascular disease (HCC)    carotid   Renal cyst, left    Schizophrenia (HCC)    Short of breath on exertion    Sleep apnea    Inspire in future   Urinary, incontinence, stress female    Wears dentures    upper   Wears partial dentures    lower    Tobacco History: Social History   Tobacco Use  Smoking Status Some Days   Current packs/day: 2.00   Average packs/day: 2.0 packs/day for 38.7 years (77.4 ttl pk-yrs)   Types: Cigarettes   Start date: 08/21/1985  Smokeless Tobacco Never  Tobacco Comments    10/23/2023 patient smoke a pack of cigarettes a month   1 cigarette when she feels like it   Ready to quit: Not Answered Counseling given: Not Answered Tobacco comments: 10/23/2023 patient smoke a pack of cigarettes a month 1 cigarette when she feels like it   Outpatient Medications Prior to Visit  Medication Sig Dispense Refill   albuterol  (PROVENTIL  HFA;VENTOLIN  HFA) 108 (90 BASE) MCG/ACT inhaler Inhale 2 puffs into the lungs every 6 (six) hours as needed for wheezing or shortness of breath.      Alirocumab  (PRALUENT ) 150 MG/ML SOAJ Inject 1 mL (150 mg total) into the skin every 14 (fourteen) days. 6 mL 6   amLODipine  (NORVASC ) 5 MG tablet Take 1 tablet (5 mg total) by mouth daily. 90 tablet 3   aspirin  EC 81 MG tablet Take 1 tablet (81 mg total) by mouth daily. Swallow whole.     atorvastatin  (LIPITOR ) 80 MG tablet TAKE 1 TABLET BY MOUTH EVERY DAY 90 tablet 2   brimonidine  (ALPHAGAN ) 0.15 % ophthalmic solution Place 1 drop into both eyes 2 (two) times daily.      Budeson-Glycopyrrol-Formoterol  (BREZTRI  AEROSPHERE) 160-9-4.8 MCG/ACT AERO Inhale 2 puffs into the lungs in the morning and at bedtime. 10.7 each 5   clopidogrel  (PLAVIX ) 75 MG tablet Take 1 tablet (75 mg total) by mouth daily. 90 tablet 3   escitalopram  (LEXAPRO ) 20 MG tablet Take 20 mg by mouth daily.     ferrous sulfate  325 (65 FE) MG EC tablet Take 325 mg by mouth daily with breakfast.     fluticasone  (FLONASE ) 50 MCG/ACT nasal spray Place 2 sprays into both nostrils daily. 16 g 6   icosapent  Ethyl (VASCEPA ) 1 g capsule TAKE 2 CAPSULES BY MOUTH 2 TIMES DAILY. 120 capsule 3   isosorbide  mononitrate (IMDUR ) 120 MG 24 hr tablet Take 1 tablet (120 mg total) by mouth daily. 90 tablet 1   lamoTRIgine  (LAMICTAL ) 100 MG tablet Take 100 mg by mouth daily.     metoprolol  tartrate (LOPRESSOR ) 25 MG tablet Take 0.5 tablets (12.5 mg total) by mouth 2 (two) times daily. 90 tablet 3   niacin  (NIASPAN ) 1000 MG CR tablet TAKE 1 TABLET (1,000  MG TOTAL) BY MOUTH AT BEDTIME. 90 tablet 3   nitroGLYCERIN  (NITROSTAT )  0.4 MG SL tablet Place 1 tablet (0.4 mg total) under the tongue every 5 (five) minutes as needed for chest pain. 25 tablet 11   ondansetron  (ZOFRAN -ODT) 4 MG disintegrating tablet Take 1 tablet (4 mg total) by mouth every 8 (eight) hours as needed for nausea or vomiting. 20 tablet 0   pantoprazole  (PROTONIX ) 40 MG tablet Take 1 tablet (40 mg total) by mouth 2 (two) times daily. 60 tablet 2   QUEtiapine  Fumarate 150 MG TABS Take 150 mg by mouth at bedtime.     ranolazine  (RANEXA ) 1000 MG SR tablet Take 1 tablet (1,000 mg total) by mouth 2 (two) times daily. 180 tablet 1   sucralfate  (CARAFATE ) 1 g tablet Take 1 tablet (1 g total) by mouth 4 (four) times daily -  with meals and at bedtime. Carafate  1 g 4 times daily x 2 weeks and  then twice daily for 1 month. Then may stop. 120 tablet 2   Vitamin D , Ergocalciferol , (DRISDOL) 1.25 MG (50000 UNIT) CAPS capsule Take 50,000 Units by mouth every 7 (seven) days.     VITAMIN E PO Take 1 tablet by mouth daily.     No facility-administered medications prior to visit.     Review of Systems: as above    Physical Exam:  BP 134/82   Pulse (!) 49   Temp 98.6 F (37 C) (Oral)   Ht 5' 5.5 (1.664 m)   Wt 186 lb (84.4 kg)   LMP 11/16/2011   SpO2 94% Comment: room air  BMI 30.48 kg/m   GEN: Pleasant, interactive, well-appearing; obese; in no acute distress. HEENT:  Normocephalic and atraumatic. PERRLA. Sclera white. Nasal turbinates pink, moist and patent bilaterally. No rhinorrhea present. Oropharynx pink and moist, without exudate or edema. No lesions, ulcerations, or postnasal drip. Mallampati III.  Tongue protrusion strong with slight deviation to the left, normal anatomical variant. NECK:  Supple w/ fair ROM. No lymphadenopathy.   CV: RRR, no m/r/g PULMONARY:  Unlabored, regular breathing. Clear bilaterally A&P w/o wheezes/rales/rhonchi. No accessory muscle use.  GI: BS  present and normoactive. Soft, non-tender to palpation.  MSK: No erythema, warmth or tenderness. Cap refil <2 sec all extrem.  Neuro: A/Ox3. No focal deficits noted.   Skin: Warm, no lesions or rashes.  Approximated and well-healed right neck incision and right chest incision Psych: Normal affect and behavior. Judgement and thought content appropriate.     Lab Results:  CBC    Component Value Date/Time   WBC 8.3 02/27/2024 1551   RBC 3.67 (L) 02/27/2024 1551   HGB 10.0 (L) 02/27/2024 1551   HCT 31.1 (L) 02/27/2024 1551   PLT 320.0 02/27/2024 1551   MCV 84.8 02/27/2024 1551   MCH 28.5 02/18/2024 1721   MCHC 32.3 02/27/2024 1551   RDW 19.2 (H) 02/27/2024 1551   LYMPHSABS 2.8 02/27/2024 1551   MONOABS 0.7 02/27/2024 1551   EOSABS 0.2 02/27/2024 1551   BASOSABS 0.1 02/27/2024 1551    BMET    Component Value Date/Time   NA 143 03/27/2024 1541   K 4.0 03/27/2024 1541   CL 105 03/27/2024 1541   CO2 22 03/27/2024 1541   GLUCOSE 111 (H) 03/27/2024 1541   GLUCOSE 115 (H) 02/27/2024 1551   BUN 9 03/27/2024 1541   CREATININE 0.86 03/27/2024 1541   CREATININE 1.10 (H) 02/11/2016 0921   CALCIUM  9.0 03/27/2024 1541   GFRNONAA 44 (L) 02/18/2024 1721   GFRAA >60 03/07/2020 0313    BNP No  results found for: BNP   Imaging:  MYOCARDIAL PERFUSION/CT RAD READ Result Date: 04/07/2024 CLINICAL DATA:  This over-read does not include interpretation of cardiac or coronary anatomy or pathology. The cardiac SPECT CT interpretation by the cardiologist is attached. COMPARISON:  CT January 28, 2024 FINDINGS: Vascular: Coronary artery calcifications. Aortic atherosclerosis. Prior median sternotomy and CABG Mediastinum/Nodes: No pathologically enlarged mediastinal lymph nodes. Lungs/Pleura: Motion degraded examination the lungs. Upper Abdomen: Gallbladder surgically absent. Musculoskeletal: Thoracic spondylosis. Stimulating generator in the anterior right chest wall. IMPRESSION: 1. No acute  extracardiac abnormality. 2. Coronary artery calcifications. 3. Aortic atherosclerosis. Aortic Atherosclerosis (ICD10-I70.0). Electronically Signed   By: Reyes Holder M.D.   On: 04/07/2024 16:26   MYOCARDIAL PERFUSION IMAGING Result Date: 04/03/2024   Findings are consistent with infarction and no ischemia. The study is low risk.   No ST deviation was noted.   LV perfusion is abnormal. There is no evidence of ischemia. There is evidence of infarction. Defect 1: There is a small defect with severe reduction in uptake present in the mid to basal inferior location(s) that is fixed. There is abnormal wall motion in the defect area. Consistent with infarction.   Left ventricular function is abnormal. There was a single regional abnormality. Nuclear stress EF: 60%. The left ventricular ejection fraction is normal (55-65%). End diastolic cavity size is mildly enlarged. End systolic cavity size is normal.   CT images were obtained for attenuation correction and were examined for the presence of coronary calcium  when appropriate.   Coronary calcium  was present on the attenuation correction CT images. Moderate coronary calcifications were present. Coronary calcifications were present in the left anterior descending artery, left circumflex artery and right coronary artery distribution(s). Aortic atherosclerosis.   Prior study available for comparison from 01/06/2022. No changes compared to prior study.    Administration History     None          Latest Ref Rng & Units 06/11/2023    1:06 PM  PFT Results  FVC-Pre L 2.65   FVC-Predicted Pre % 87   FVC-Post L 2.73   FVC-Predicted Post % 89   Pre FEV1/FVC % % 63   Post FEV1/FCV % % 69   FEV1-Pre L 1.67   FEV1-Predicted Pre % 71   FEV1-Post L 1.88   DLCO uncorrected ml/min/mmHg 18.42   DLCO UNC% % 97   DLCO corrected ml/min/mmHg 18.42   DLCO COR %Predicted % 97   DLVA Predicted % 110   TLC L 4.87   TLC % Predicted % 102   RV % Predicted % 109      No results found for: NITRICOXIDE      Assessment & Plan:   OSA (obstructive sleep apnea) Severe OSA, intolerant of CPAP.  Inspire implanted 10/10/2023.  Delayed activation due to ongoing nerve healing; activation date 01/04/2024.  Tolerating well. Stepping up as appropriate for the most part. Adjusted to level 5, 1.8 V today. No discomfort and comfortable tongue protrusion with slight deviation to the left on exam. Waveform appropriate. Re-educated to increase by 1 level weekly, as long as there is no discomfort. Verbalized understanding. Adjusted duration to 9 hr as there are some nights she is sleeping between 8-9 hours. Aware to turn off in AM when awakening. Receiving benefit from use. Part of her difficulties are related to chronic insomnia/mania related to bipolar disorder. Encouraged to continue management as per psychiatry. Understands remote use.  Understands to utilize nightly or anytime that she is  napping.  Aware of risks of untreated sleep apnea.  Safe driving practices reviewed.  Patient Instructions  Stepped up to level 5 (1.8 V). You will increase next Friday, 9/19 to level 6 and then every Friday by 1 level after this. If you have any discomfort, you will back down by 1 level, stay there for a week then try to increase again. If you still are uncomfortable on the level, go back down by 1 level and stay there until your sleep study. Do not increase faster than once a week I also changed your duration on your Inspire to 9 hours since you have some nights you're hitting 8 hours   Inspire titration study ordered today   Follow up 10/24 at 3 pm video visit for pre titration check inand then 11/21 at 1130 with Katie Khamani Daniely,NP to review Inspire titration. If symptoms do not improve or worsen, please contact office for sooner follow up    COPD with chronic bronchitis and emphysema (HCC) Stable without any recent exacerbations.  Continue triple therapy regimen.  Action plan in  place.     I spent 35 minutes of dedicated to the care of this patient on the date of this encounter to include pre-visit review of records, face-to-face time with the patient discussing conditions above, post visit ordering of testing, clinical documentation with the electronic health record, making appropriate referrals as documented, and communicating necessary findings to members of the patients care team.  Comer LULLA Rouleau, NP 05/02/2024  Pt aware and understands NP's role.

## 2024-05-06 ENCOUNTER — Ambulatory Visit (AMBULATORY_SURGERY_CENTER): Admitting: Gastroenterology

## 2024-05-06 ENCOUNTER — Encounter: Payer: Self-pay | Admitting: Gastroenterology

## 2024-05-06 VITALS — BP 147/69 | HR 67 | Temp 98.0°F | Resp 13 | Ht 63.5 in | Wt 190.0 lb

## 2024-05-06 DIAGNOSIS — K2289 Other specified disease of esophagus: Secondary | ICD-10-CM

## 2024-05-06 DIAGNOSIS — Q439 Congenital malformation of intestine, unspecified: Secondary | ICD-10-CM | POA: Diagnosis not present

## 2024-05-06 DIAGNOSIS — Z1211 Encounter for screening for malignant neoplasm of colon: Secondary | ICD-10-CM | POA: Diagnosis not present

## 2024-05-06 DIAGNOSIS — K3189 Other diseases of stomach and duodenum: Secondary | ICD-10-CM | POA: Diagnosis not present

## 2024-05-06 DIAGNOSIS — K648 Other hemorrhoids: Secondary | ICD-10-CM | POA: Diagnosis not present

## 2024-05-06 DIAGNOSIS — K5730 Diverticulosis of large intestine without perforation or abscess without bleeding: Secondary | ICD-10-CM | POA: Diagnosis not present

## 2024-05-06 DIAGNOSIS — Z860100 Personal history of colon polyps, unspecified: Secondary | ICD-10-CM

## 2024-05-06 DIAGNOSIS — D509 Iron deficiency anemia, unspecified: Secondary | ICD-10-CM

## 2024-05-06 DIAGNOSIS — K259 Gastric ulcer, unspecified as acute or chronic, without hemorrhage or perforation: Secondary | ICD-10-CM

## 2024-05-06 DIAGNOSIS — K449 Diaphragmatic hernia without obstruction or gangrene: Secondary | ICD-10-CM | POA: Diagnosis not present

## 2024-05-06 MED ORDER — SODIUM CHLORIDE 0.9 % IV SOLN: 500.0000 mL | Freq: Once | INTRAVENOUS | Status: AC

## 2024-05-06 NOTE — Patient Instructions (Addendum)
 Resume Plavix  Today 05/06/24. Await pathology results.   YOU HAD AN ENDOSCOPIC PROCEDURE TODAY AT THE [facility]:   Refer to the procedure report that was given to you for any specific questions about what was found during the examination.  If the procedure report does not answer your questions, please call your gastroenterologist to clarify.  If you requested that your care partner not be given the details of your procedure findings, then the procedure report has been included in a sealed envelope for you to review at your convenience later.  YOU SHOULD EXPECT: Some feelings of bloating in the abdomen. Passage of more gas than usual.  Walking can help get rid of the air that was put into your GI tract during the procedure and reduce the bloating. If you had a lower endoscopy (such as a colonoscopy or flexible sigmoidoscopy) you may notice spotting of blood in your stool or on the toilet paper. If you underwent a bowel prep for your procedure, you may not have a normal bowel movement for a few days.  Please Note:  You might notice some irritation and congestion in your nose or some drainage.  This is from the oxygen used during your procedure.  There is no need for concern and it should clear up in a day or so.  SYMPTOMS TO REPORT IMMEDIATELY:  Following lower endoscopy (colonoscopy or flexible sigmoidoscopy):  Excessive amounts of blood in the stool  Significant tenderness or worsening of abdominal pains  Swelling of the abdomen that is new, acute  Fever of 100F or higher  Following upper endoscopy (EGD)  Vomiting of blood or coffee ground material  New chest pain or pain under the shoulder blades  Painful or persistently difficult swallowing  New shortness of breath  Fever of 100F or higher  Black, tarry-looking stools  For urgent or emergent issues, a gastroenterologist can be reached at any hour by calling [phone]. Do not use MyChart messaging for urgent concerns.     DIET:  We do recommend a small meal at first, but then you may proceed to your regular diet.  Drink plenty of fluids but you should avoid alcoholic beverages for 24 hours.  ACTIVITY:  You should plan to take it easy for the rest of today and you should NOT DRIVE or use heavy machinery until tomorrow (because of the sedation medicines used during the test).    FOLLOW UP: Our staff will call the number listed on your records the next business day following your procedure.  We will call around 7:15- 8:00 am to check on you and address any questions or concerns that you may have regarding the information given to you following your procedure. If we do not reach you, we will leave a message.     If any biopsies were taken you will be contacted by phone or by letter within the next 1-3 weeks.  Please call us  at [phone] if you have not heard about the biopsies in 3 weeks.    SIGNATURES/CONFIDENTIALITY: You and/or your care partner have signed paperwork which will be entered into your electronic medical record.  These signatures attest to the fact that that the information above on your After Visit Summary has been reviewed and is understood.  Full responsibility of the confidentiality of this discharge information lies with you and/or your care-partner.

## 2024-05-06 NOTE — Progress Notes (Signed)
 [facility] History and Physical   Primary Care Physician:  Rena Luke POUR, MD   Reason for Procedure:   IDA, history of gastric ulcers, history of colon polyps  Plan:    EGD and colonoscopy     HPI: [name] is a 63 y.o. female  here for EGD and colonoscopy - history of upper GI bleed secondary to gastric ulcers in May. H pylori negative. On protonix  40mg  BID. Also on plavix . HIstory of IDA, colonoscopy 04/2021 with 7 polyps removed. Here for surveillance of polyps and IDA evaluation. Plavix  held for 5 days for this exam. She is concerned she still has ulcers. Continues to have some abdominal discomfort in upper abdomen at times. IS compliant with protonix . Denies NSAIDs.   Patient denies any bowel symptoms at this time. Otherwise feels well without any cardiopulmonary symptoms.   I have discussed risks / benefits of anesthesia and endoscopic procedure with [name] and they wish to proceed with the exams as outlined today.    Past Medical History:  Diagnosis Date   Allergy    Anemia    takes iron supplement   Arthritis    knees, shoulders   Asthma    Bipolar 1 disorder (HCC)    Blood transfusion without reported diagnosis    Cataract, immature    CHF (congestive heart failure) (HCC)    Clotting disorder (HCC)    she denied any specific genetic bleeding/clotting diagnosis (10/05/23)   COPD (chronic obstructive pulmonary disease) (HCC)    Coronary artery disease    triple vessel   Depression    Dyspnea    Family history of adverse reaction to anesthesia    states father did not do well with anesthesia; died last time he had surgery; also had heart disease   Gallstones    GERD (gastroesophageal reflux disease)    occasional - no current med.   Glaucoma    Hammertoe 05/2015   right second toe   Heart murmur    Hepatic steatosis    High cholesterol    History of MI (myocardial infarction)    Hypertension    states under control with  meds., has been on med. x 8 yr.   Myocardial infarction (HCC)    Osteoporosis    Panic attacks    Peripheral vascular disease (HCC)    carotid   Renal cyst, left    Schizophrenia (HCC)    Short of breath on exertion    Sleep apnea    Inspire in future   Urinary, incontinence, stress female    Wears dentures    upper   Wears partial dentures    lower    Past Surgical History:  Procedure Laterality Date   C-SECTIONS X 2  1985, 1988   CARDIAC CATHETERIZATION  12/10/2006; 12/18/2006; 07/10/2007; 07/19/2009   CARDIAC CATHETERIZATION  03/04/2010   patent grafts, normal LV function, diffusely diseased RCA (Dr. DOROTHA Lesches)   CARDIAC CATHETERIZATION N/A 02/15/2016   Procedure: Left Heart Cath and Cors/Grafts Angiography;  Surgeon: Peter M Swaziland, MD;  Location: [facility] INVASIVE CV LAB;  Service: Cardiovascular;  Laterality: N/A;   CHOLECYSTECTOMY N/A 06/29/2023   Procedure: LAPAROSCOPIC CHOLECYSTECTOMY WITH ICG DYE;  Surgeon: Tanda Locus, MD;  Location: [facility] ORS;  Service: General;  Laterality: N/A;   CLUB FOOT RELEASE Bilateral    1st and 2nd toes   COLONOSCOPY     CORONARY ARTERY BYPASS GRAFT  07/16/2007   LIMA to LAD, VG to  ramus, VG to PDA    CORONARY STENT INTERVENTION N/A 06/23/2019   Procedure: CORONARY STENT INTERVENTION;  Surgeon: Angela Dorn PARAS, MD;  Location: [facility] INVASIVE CV LAB;  Service: Cardiovascular;  Laterality: N/A;   DRUG INDUCED ENDOSCOPY N/A 05/17/2023   Procedure: DRUG INDUCED ENDOSCOPY;  Surgeon: Jude Harden GAILS, MD;  Location: [facility] ENDOSCOPY;  Service: Pulmonary;  Laterality: N/A;   ESOPHAGOGASTRODUODENOSCOPY N/A 01/07/2024   Procedure: EGD (ESOPHAGOGASTRODUODENOSCOPY);  Surgeon: Wilhelmenia Aloha Raddle., MD;  Location: [facility];  Service: Gastroenterology;  Laterality: N/A;   FLEXIBLE BRONCHOSCOPY  07/16/2007   HAMMERTOE RECONSTRUCTION WITH WEIL OSTEOTOMY Right 06/03/2015   Procedure: RIGHT SECOND HAMMERTOE CORRECTION WITH MT WEIL OSTEOTOMY;  Surgeon: Norleen Armor, MD;   Location: [facility];  Service: Orthopedics;  Laterality: Right;   HAND SURGERY Bilateral    1st and 2nd fingers - release of clubbing   IMPLANTATION OF HYPOGLOSSAL NERVE STIMULATOR N/A 10/10/2023   Procedure: IMPLANTATION OF HYPOGLOSSAL NERVE STIMULATOR;  Surgeon: Okey Burns, MD;  Location: [facility] OR;  Service: ENT;  Laterality: N/A;  right   KNEE ARTHROSCOPY WITH ANTERIOR CRUCIATE LIGAMENT (ACL) REPAIR Right 01/30/2008   LEFT HEART CATH AND CORS/GRAFTS ANGIOGRAPHY N/A 06/23/2019   Procedure: LEFT HEART CATH AND CORS/GRAFTS ANGIOGRAPHY;  Surgeon: Angela Dorn PARAS, MD;  Location: [facility] INVASIVE CV LAB;  Service: Cardiovascular;  Laterality: N/A;   LEFT HEART CATH AND CORS/GRAFTS ANGIOGRAPHY N/A 03/01/2020   Procedure: LEFT HEART CATH AND CORS/GRAFTS ANGIOGRAPHY;  Surgeon: Claudene Victory ORN, MD;  Location: [facility] INVASIVE CV LAB;  Service: Cardiovascular;  Laterality: N/A;   NOVASURE ABLATION  12/19/2007   with removal of IUD   ORIF FOOT FRACTURE Left    TOE SURGERIES TO REMOVE NAILS DUE TO CLUBBING     TOTAL KNEE ARTHROPLASTY Right 10/21/2012   Procedure: TOTAL KNEE ARTHROPLASTY;  Surgeon: Donnice JONETTA Car, MD;  Location: [facility];  Service: Orthopedics;  Laterality: Right;   UPPER GASTROINTESTINAL ENDOSCOPY      Prior to Admission medications   Medication Sig Start Date End Date Taking? Authorizing Provider  albuterol  (PROVENTIL  HFA;VENTOLIN  HFA) 108 (90 BASE) MCG/ACT inhaler Inhale 2 puffs into the lungs every 6 (six) hours as needed for wheezing or shortness of breath.    Yes [provider]  amLODipine  (NORVASC ) 5 MG tablet Take 1 tablet (5 mg total) by mouth daily. 01/24/24  Yes Angela Dorn PARAS, MD  aspirin  EC 81 MG tablet Take 1 tablet (81 mg total) by mouth daily. Swallow whole. 01/08/24 01/07/25 Yes Vann, Jessica U, DO  atorvastatin  (LIPITOR ) 80 MG tablet TAKE 1 TABLET BY MOUTH EVERY DAY 05/02/24  Yes Angela Dorn PARAS, MD  brimonidine  (ALPHAGAN ) 0.15 % ophthalmic solution  Place 1 drop into both eyes 2 (two) times daily.    Yes [provider]  Budeson-Glycopyrrol-Formoterol  (BREZTRI  AEROSPHERE) 160-9-4.8 MCG/ACT AERO Inhale 2 puffs into the lungs in the morning and at bedtime. 04/30/23  Yes Cobb, Comer GAILS, NP  escitalopram  (LEXAPRO ) 20 MG tablet Take 20 mg by mouth daily. 05/31/19  Yes [provider]  ferrous sulfate  325 (65 FE) MG EC tablet Take 325 mg by mouth daily with breakfast. 03/11/24  Yes [provider]  fluticasone  (FLONASE ) 50 MCG/ACT nasal spray Place 2 sprays into both nostrils daily. 05/30/23  Yes Soldatova, Liuba, MD  icosapent  Ethyl (VASCEPA ) 1 g capsule TAKE 2 CAPSULES BY MOUTH 2 TIMES DAILY. 09/03/23  Yes Angela Dorn PARAS, MD  isosorbide  mononitrate (IMDUR ) 120 MG 24 hr tablet  Take 1 tablet (120 mg total) by mouth daily. 03/20/24  Yes Fountain, Madison L, NP  lamoTRIgine  (LAMICTAL ) 100 MG tablet Take 100 mg by mouth daily. 08/05/19  Yes [provider]  metoprolol  tartrate (LOPRESSOR ) 25 MG tablet Take 0.5 tablets (12.5 mg total) by mouth 2 (two) times daily. 01/24/24  Yes Angela Dorn PARAS, MD  niacin  (NIASPAN ) 1000 MG CR tablet TAKE 1 TABLET (1,000 MG TOTAL) BY MOUTH AT BEDTIME. 03/17/24  Yes Angela Dorn PARAS, MD  ondansetron  (ZOFRAN -ODT) 4 MG disintegrating tablet Take 1 tablet (4 mg total) by mouth every 8 (eight) hours as needed for nausea or vomiting. 01/20/24  Yes Garrick Charleston, MD  pantoprazole  (PROTONIX ) 40 MG tablet Take 1 tablet (40 mg total) by mouth 2 (two) times daily. 02/27/24 05/27/24 Yes [place], PA-C  QUEtiapine  Fumarate 150 MG TABS Take 150 mg by mouth at bedtime.   Yes [provider]  ranolazine  (RANEXA ) 1000 MG SR tablet Take 1 tablet (1,000 mg total) by mouth 2 (two) times daily. 03/20/24  Yes Fountain, Madison L, NP  sucralfate  (CARAFATE ) 1 g tablet Take 1 tablet (1 g total) by mouth 4 (four) times daily -  with meals and at bedtime. Carafate  1 g 4 times daily x 2 weeks and  then  twice daily for 1 month. Then may stop. 02/27/24 05/27/24 Yes [place], PA-C  Vitamin D , Ergocalciferol , (DRISDOL) 1.25 MG (50000 UNIT) CAPS capsule Take 50,000 Units by mouth every 7 (seven) days. 03/11/24 03/11/25 Yes [provider]  VITAMIN E PO Take 1 tablet by mouth daily.   Yes [provider]  Alirocumab  (PRALUENT ) 150 MG/ML SOAJ Inject 1 mL (150 mg total) into the skin every 14 (fourteen) days. 07/10/23   Jerilynn Lamarr HERO, NP  clopidogrel  (PLAVIX ) 75 MG tablet Take 1 tablet (75 mg total) by mouth daily. 04/07/24 04/07/25  Rana Lum CROME, NP  nitroGLYCERIN  (NITROSTAT ) 0.4 MG SL tablet Place 1 tablet (0.4 mg total) under the tongue every 5 (five) minutes as needed for chest pain. 01/24/24   Angela Dorn PARAS, MD    Current Outpatient Medications  Medication Sig Dispense Refill   albuterol  (PROVENTIL  HFA;VENTOLIN  HFA) 108 (90 BASE) MCG/ACT inhaler Inhale 2 puffs into the lungs every 6 (six) hours as needed for wheezing or shortness of breath.      amLODipine  (NORVASC ) 5 MG tablet Take 1 tablet (5 mg total) by mouth daily. 90 tablet 3   aspirin  EC 81 MG tablet Take 1 tablet (81 mg total) by mouth daily. Swallow whole.     atorvastatin  (LIPITOR ) 80 MG tablet TAKE 1 TABLET BY MOUTH EVERY DAY 90 tablet 2   brimonidine  (ALPHAGAN ) 0.15 % ophthalmic solution Place 1 drop into both eyes 2 (two) times daily.      Budeson-Glycopyrrol-Formoterol  (BREZTRI  AEROSPHERE) 160-9-4.8 MCG/ACT AERO Inhale 2 puffs into the lungs in the morning and at bedtime. 10.7 each 5   escitalopram  (LEXAPRO ) 20 MG tablet Take 20 mg by mouth daily.     ferrous sulfate  325 (65 FE) MG EC tablet Take 325 mg by mouth daily with breakfast.     fluticasone  (FLONASE ) 50 MCG/ACT nasal spray Place 2 sprays into both nostrils daily. 16 g 6   icosapent  Ethyl (VASCEPA ) 1 g capsule TAKE 2 CAPSULES BY MOUTH 2 TIMES DAILY. 120 capsule 3   isosorbide  mononitrate (IMDUR ) 120 MG 24 hr tablet Take 1 tablet (120 mg total)  by mouth daily. 90 tablet 1   lamoTRIgine  (LAMICTAL )  100 MG tablet Take 100 mg by mouth daily.     metoprolol  tartrate (LOPRESSOR ) 25 MG tablet Take 0.5 tablets (12.5 mg total) by mouth 2 (two) times daily. 90 tablet 3   niacin  (NIASPAN ) 1000 MG CR tablet TAKE 1 TABLET (1,000 MG TOTAL) BY MOUTH AT BEDTIME. 90 tablet 3   ondansetron  (ZOFRAN -ODT) 4 MG disintegrating tablet Take 1 tablet (4 mg total) by mouth every 8 (eight) hours as needed for nausea or vomiting. 20 tablet 0   pantoprazole  (PROTONIX ) 40 MG tablet Take 1 tablet (40 mg total) by mouth 2 (two) times daily. 60 tablet 2   QUEtiapine  Fumarate 150 MG TABS Take 150 mg by mouth at bedtime.     ranolazine  (RANEXA ) 1000 MG SR tablet Take 1 tablet (1,000 mg total) by mouth 2 (two) times daily. 180 tablet 1   sucralfate  (CARAFATE ) 1 g tablet Take 1 tablet (1 g total) by mouth 4 (four) times daily -  with meals and at bedtime. Carafate  1 g 4 times daily x 2 weeks and  then twice daily for 1 month. Then may stop. 120 tablet 2   Vitamin D , Ergocalciferol , (DRISDOL) 1.25 MG (50000 UNIT) CAPS capsule Take 50,000 Units by mouth every 7 (seven) days.     VITAMIN E PO Take 1 tablet by mouth daily.     Alirocumab  (PRALUENT ) 150 MG/ML SOAJ Inject 1 mL (150 mg total) into the skin every 14 (fourteen) days. 6 mL 6   clopidogrel  (PLAVIX ) 75 MG tablet Take 1 tablet (75 mg total) by mouth daily. 90 tablet 3   nitroGLYCERIN  (NITROSTAT ) 0.4 MG SL tablet Place 1 tablet (0.4 mg total) under the tongue every 5 (five) minutes as needed for chest pain. 25 tablet 11   Current Facility-Administered Medications  Medication Dose Route Frequency Provider Last Rate Last Admin   0.9 %  sodium chloride  infusion  500 mL Intravenous Once Nikky Duba, Elspeth SQUIBB, MD        Allergies as of 05/06/2024 - Review Complete 05/06/2024  Allergen Reaction Noted   Benadryl  [diphenhydramine  hcl] Shortness Of Breath and Swelling 06/03/2015   Codeine Other (See Comments) 11/18/2011    Haldol [haloperidol decanoate] Anaphylaxis 11/18/2011   Amoxicillin Hives and Swelling 11/18/2011   Bee venom Swelling and Other (See Comments) 11/16/2015    Family History  Problem Relation Age of Onset   Lung cancer Mother    Kidney cancer Mother    Heart disease Father    Anesthesia problems Father        states did not do well with anesthesia   Colon polyps Sister    Breast cancer Maternal Aunt    Breast cancer Maternal Aunt    Stomach cancer Neg Hx    Esophageal cancer Neg Hx    Pancreatic cancer Neg Hx    Rectal cancer Neg Hx    Colon cancer Neg Hx     Social History   Socioeconomic History   Marital status: Divorced    Spouse name: Not on file   Number of children: Not on file   Years of education: Not on file   Highest education level: Not on file  Occupational History   Not on file  Tobacco Use   Smoking status: Some Days    Current packs/day: 2.00    Average packs/day: 2.0 packs/day for 38.7 years (77.4 ttl pk-yrs)    Types: Cigarettes    Start date: 08/21/1985   Smokeless tobacco: Never   Tobacco comments:  10/23/2023 patient smoke a pack of cigarettes a month    1 cigarette when she feels like it  Vaping Use   Vaping status: Never Used  Substance and Sexual Activity   Alcohol use: No   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   Sexual activity: Not on file  Other Topics Concern   Not on file  Social History Narrative   Not on file   Social Drivers of Health   Financial Resource Strain: Low Risk  (01/15/2024)   Received from [facility]   Overall Financial Resource Strain (CARDIA)    Difficulty of Paying Living Expenses: Not very hard  Food Insecurity: Food Insecurity Present (01/15/2024)   Received from [facility]   Hunger Vital Sign    Within the past 12 months, you worried that your food would run out before you got the money to buy more.: Never true    Within the past 12 months, the food you bought just didn't last and you  didn't have money to get more.: Sometimes true  Transportation Needs: Unmet Transportation Needs (01/15/2024)   Received from [facility] - Transportation    Lack of Transportation (Medical): Yes    Lack of Transportation (Non-Medical): No  Physical Activity: Insufficiently Active (01/15/2024)   Received from [facility]   Exercise Vital Sign    On average, how many days per week do you engage in moderate to strenuous exercise (like a brisk walk)?: 1 day    On average, how many minutes do you engage in exercise at this level?: 30 min  Stress: Stress Concern Present (01/15/2024)   Received from [facility] - Occupational Stress Questionnaire    Feeling of Stress : To some extent  Social Connections: Somewhat Isolated (01/15/2024)   Received from [facility]   Social Network    How would you rate your social network (family, work, friends)?: Restricted participation with some degree of social isolation  Intimate Partner Violence: Not At Risk (01/15/2024)   Received from [facility]   HITS    Over the last 12 months how often did your partner physically hurt you?: Never    Over the last 12 months how often did your partner insult you or talk down to you?: Never    Over the last 12 months how often did your partner threaten you with physical harm?: Never    Over the last 12 months how often did your partner scream or curse at you?: Never    Review of Systems: All other review of systems negative except as mentioned in the HPI.  Physical Exam: Vital signs BP 129/69   Pulse 70   Temp 98 F (36.7 C) (Temporal)   Ht 5' 3.5 (1.613 m)   Wt 190 lb (86.2 kg)   LMP 11/16/2011   SpO2 98%   BMI 33.13 kg/m   General:   Alert,  Well-developed, pleasant and cooperative in NAD Lungs:  Clear throughout to auscultation.   Heart:  Regular rate and rhythm Abdomen:  Soft, nontender and nondistended.   Neuro/Psych:  Alert and  cooperative. Normal mood and affect. A and O x 3  Marcey Naval, MD [facility]

## 2024-05-06 NOTE — Progress Notes (Signed)
 Called to room to assist during endoscopic procedure.  Patient ID and intended procedure confirmed with present staff. Received instructions for my participation in the procedure from the performing physician.

## 2024-05-06 NOTE — Op Note (Addendum)
 [facility] Patient Name: [name] Procedure Date: 05/06/2024 3:20 PM MRN: [id number] Endoscopist: Elspeth P. Leigh , MD, [id number] Age: 63 Referring MD:  Date of Birth: [birth year] Gender: Female Account #: [id number] Procedure:                Colonoscopy Indications:              High risk colon cancer surveillance: Personal                            history of colonic polyps - 7 polyps removed                            04/2021, iron deficiency anemia Medicines:                Monitored Anesthesia Care Procedure:                Pre-Anesthesia Assessment:                           - Prior to the procedure, a History and Physical                            was performed, and patient medications and                            allergies were reviewed. The patient's tolerance of                            previous anesthesia was also reviewed. The risks                            and benefits of the procedure and the sedation                            options and risks were discussed with the patient.                            All questions were answered, and informed consent                            was obtained. Prior Anticoagulants: The patient has                            taken Plavix  (clopidogrel ), last dose was 5 days                            prior to procedure. ASA Grade Assessment: III - A                            patient with severe systemic disease. After                            reviewing the risks and benefits, the patient was  deemed in satisfactory condition to undergo the                            procedure.                           After obtaining informed consent, the colonoscope                            was passed under direct vision. Throughout the                            procedure, the patient's blood pressure, pulse, and                            oxygen saturations were monitored continuously. The                             Olympus CF-HQ190L ([id number]) Colonoscope was                            introduced through the anus and advanced to the the                            terminal ileum, with identification of the                            appendiceal orifice and IC valve. The colonoscopy                            was performed without difficulty. The patient                            tolerated the procedure well. The quality of the                            bowel preparation was good. The terminal ileum,                            ileocecal valve, appendiceal orifice, and rectum                            were photographed. Scope In: 4:01:41 PM Scope Out: 4:22:53 PM Scope Withdrawal Time: 0 hours 18 minutes 15 seconds  Total Procedure Duration: 0 hours 21 minutes 12 seconds  Findings:                 The perianal and digital rectal examinations were                            normal.                           The terminal ileum appeared normal.  A few small-mouthed diverticula were found in the                            sigmoid colon.                           The colon (entire examined portion) was tortuous.                           Internal hemorrhoids were found during retroflexion.                           The exam was otherwise without abnormality. Complications:            No immediate complications. Estimated blood loss:                            None. Estimated Blood Loss:     Estimated blood loss: none. Impression:               - The examined portion of the ileum was normal.                           - Diverticulosis in the sigmoid colon.                           - Tortuous colon.                           - Internal hemorrhoids.                           - The examination was otherwise normal.                           - No polyps.                           No pathology noted to cause iron deficiency. Recommendation:           -  Patient has a contact number available for                            emergencies. The signs and symptoms of potential                            delayed complications were discussed with the                            patient. Return to normal activities tomorrow.                            Written discharge instructions were provided to the                            patient.                           -  Resume previous diet.                           - Continue present medications.                           - Resume Plavix  today.                           - Repeat colonoscopy in 5 years for surveillance.                           - Trend Hgb, continue iron.                           - Consideration for IV iron and capsule endoscopy                            pending course Elspeth P. Betty Daidone, MD 05/06/2024 4:33:18 PM This report has been signed electronically.

## 2024-05-06 NOTE — Op Note (Signed)
 [facility] Patient Name: [name] Procedure Date: 05/06/2024 3:35 PM MRN: [id number] Endoscopist: [name] , MD, [id number] Age: 63 Referring MD:  Date of Birth: [birth year] Gender: Female Account #: [id number] Procedure:                Upper GI endoscopy Indications:              Iron deficiency anemia, follow-up of gastric ulcer                            - GI bleed in May secondary to gastric ulcers. On                            Protonix  40mg  twice daily Medicines:                Monitored Anesthesia Care Procedure:                Pre-Anesthesia Assessment:                           - Prior to the procedure, a History and Physical                            was performed, and patient medications and                            allergies were reviewed. The patient's tolerance of                            previous anesthesia was also reviewed. The risks                            and benefits of the procedure and the sedation                            options and risks were discussed with the patient.                            All questions were answered, and informed consent                            was obtained. Prior Anticoagulants: The patient has                            taken Plavix  (clopidogrel ), last dose was 5 days                            prior to procedure. ASA Grade Assessment: III - A                            patient with severe systemic disease. After                            reviewing the risks and benefits, the patient was  deemed in satisfactory condition to undergo the                            procedure.                           After obtaining informed consent, the endoscope was                            passed under direct vision. Throughout the                            procedure, the patient's blood pressure, pulse, and                            oxygen saturations were monitored  continuously. The                            Olympus Scope SN [id number] was introduced through the                            mouth, and advanced to the second part of duodenum.                            The upper GI endoscopy was accomplished without                            difficulty. The patient tolerated the procedure                            well. Scope In: Scope Out: Findings:                 Esophagogastric landmarks were identified: the                            Z-line was found at 35 cm, the gastroesophageal                            junction was found at 35 cm and the upper extent of                            the gastric folds was found at 38 cm from the                            incisors.                           A 3 cm hiatal hernia was present.                           The Z-line was slightly irregular and was found 35                            cm from the incisors. It did  not meet criteria for                            Barrett's biopsies.                           The exam of the esophagus was otherwise normal.                           The entire examined stomach was normal. Interval                            healing of gastric ulcers. Biopsies were taken with                            a cold forceps for Helicobacter pylori testing.                           The examined duodenum was normal. Complications:            No immediate complications. Estimated blood loss:                            Minimal. Estimated Blood Loss:     Estimated blood loss was minimal. Impression:               - Esophagogastric landmarks identified.                           - 3 cm hiatal hernia.                           - Z-line irregular, 35 cm from the incisors.                           - Normal esophagus otherwise.                           - Normal stomach. Biopsied.                           - Normal examined duodenum.                           Overall, interval healing of  ulcers on protonix . No                            cause of iron deficiency on this exam. Biopsies                            taken to ensure no H pylori. Recommendation:           - Patient has a contact number available for                            emergencies. The signs and symptoms of potential  delayed complications were discussed with the                            patient. Return to normal activities tomorrow.                            Written discharge instructions were provided to the                            patient.                           - Resume previous diet.                           - Continue present medications.                           - Resume Plavix  today.                           - Await pathology results.                           - Trend Hgb on iron, if IDA persists then recommend                            IV Iron and consideration for capsule endoscopy.                            Prior CT scan did not show any clear cause for IDA. Angela P. Shabazz Mckey, MD 05/06/2024 4:56:17 PM This report has been signed electronically.

## 2024-05-06 NOTE — Progress Notes (Signed)
 A/o x 3, VSS, gd SR's, pleased with anesthesia, report to RN

## 2024-05-07 ENCOUNTER — Telehealth: Payer: Self-pay

## 2024-05-07 NOTE — Telephone Encounter (Signed)
 Follow up call to pt, no answer.

## 2024-05-09 LAB — SURGICAL PATHOLOGY

## 2024-05-11 ENCOUNTER — Ambulatory Visit: Payer: Self-pay | Admitting: Gastroenterology

## 2024-05-16 ENCOUNTER — Other Ambulatory Visit: Payer: Self-pay | Admitting: Cardiovascular Disease

## 2024-05-19 NOTE — Telephone Encounter (Signed)
 Called and spoke to patient.  She will plan to go to the lab one day next week - labs are under [place]

## 2024-05-24 ENCOUNTER — Other Ambulatory Visit: Payer: Self-pay | Admitting: Physician Assistant

## 2024-05-24 DIAGNOSIS — K259 Gastric ulcer, unspecified as acute or chronic, without hemorrhage or perforation: Secondary | ICD-10-CM

## 2024-05-26 ENCOUNTER — Other Ambulatory Visit

## 2024-05-26 DIAGNOSIS — D509 Iron deficiency anemia, unspecified: Secondary | ICD-10-CM

## 2024-05-26 DIAGNOSIS — E559 Vitamin D deficiency, unspecified: Secondary | ICD-10-CM

## 2024-05-26 LAB — CBC WITH DIFFERENTIAL/PLATELET
Basophils Absolute: 0.1 K/uL (ref 0.0–0.1)
Basophils Relative: 0.8 % (ref 0.0–3.0)
Eosinophils Absolute: 0.2 K/uL (ref 0.0–0.7)
Eosinophils Relative: 2.2 % (ref 0.0–5.0)
HCT: 33.5 % — ABNORMAL LOW (ref 36.0–46.0)
Hemoglobin: 10.6 g/dL — ABNORMAL LOW (ref 12.0–15.0)
Lymphocytes Relative: 34.6 % (ref 12.0–46.0)
Lymphs Abs: 2.7 K/uL (ref 0.7–4.0)
MCHC: 31.7 g/dL (ref 30.0–36.0)
MCV: 78.5 fl (ref 78.0–100.0)
Monocytes Absolute: 0.5 K/uL (ref 0.1–1.0)
Monocytes Relative: 6.5 % (ref 3.0–12.0)
Neutro Abs: 4.3 K/uL (ref 1.4–7.7)
Neutrophils Relative %: 55.9 % (ref 43.0–77.0)
Platelets: 232 K/uL (ref 150.0–400.0)
RBC: 4.26 Mil/uL (ref 3.87–5.11)
RDW: 21.9 % — ABNORMAL HIGH (ref 11.5–15.5)
WBC: 7.7 K/uL (ref 4.0–10.5)

## 2024-05-26 LAB — VITAMIN D 25 HYDROXY (VIT D DEFICIENCY, FRACTURES): VITD: 15.57 ng/mL — ABNORMAL LOW (ref 30.00–100.00)

## 2024-05-27 ENCOUNTER — Ambulatory Visit: Payer: Self-pay | Admitting: Physician Assistant

## 2024-05-27 DIAGNOSIS — D509 Iron deficiency anemia, unspecified: Secondary | ICD-10-CM

## 2024-05-27 LAB — BASIC METABOLIC PANEL WITH GFR
BUN: 11 mg/dL (ref 6–23)
CO2: 24 meq/L (ref 19–32)
Calcium: 9.1 mg/dL (ref 8.4–10.5)
Chloride: 106 meq/L (ref 96–112)
Creatinine, Ser: 1.16 mg/dL (ref 0.40–1.20)
GFR: 50.57 mL/min — ABNORMAL LOW (ref >60.00)
Glucose, Bld: 110 mg/dL — ABNORMAL HIGH (ref 70–99)
Potassium: 3.8 meq/L (ref 3.5–5.1)
Sodium: 142 meq/L (ref 135–145)

## 2024-05-27 LAB — IBC PANEL
Iron: 20 ug/dL — ABNORMAL LOW (ref 42–145)
Saturation Ratios: 4.4 % — ABNORMAL LOW (ref 20.0–50.0)
TIBC: 452.2 ug/dL — ABNORMAL HIGH (ref 250.0–450.0)
Transferrin: 323 mg/dL (ref 212.0–360.0)

## 2024-05-27 NOTE — Progress Notes (Signed)
 Notify patient labs show: 1.  Persistent low vitamin D .  Her vitamin D  is lower than 3 months ago.  Is she taking prescription vitamin D ?  If not, call in vitamin D  50,000 units, take 1/week for 12 weeks, #12, 1 refill. 2.  She has persistent iron deficiency anemia.  Hemoglobin is a little lower from 3 months ago, yet stable.  Please refer to hematology for IV iron infusion. 3.  Schedule capsule endoscopy to evaluate persistent iron deficiency anemia.  Recent EGD and colonoscopy were unrevealing for sources of iron deficiency anemia. 4.  Schedule patient follow-up office visit in 3 months with TG or Dr. Leigh. Ellouise Console, PA-C  CC: Dr. Leigh for FYI.

## 2024-05-29 NOTE — Progress Notes (Signed)
 This encounter was created in error - please disregard.

## 2024-05-30 MED ORDER — VITAMIN D (ERGOCALCIFEROL) 1.25 MG (50000 UNIT) PO CAPS: 50000.0000 [IU] | ORAL_CAPSULE | ORAL | 11 refills | Status: AC

## 2024-06-03 ENCOUNTER — Other Ambulatory Visit: Payer: Self-pay | Admitting: Family Medicine

## 2024-06-03 DIAGNOSIS — Z1231 Encounter for screening mammogram for malignant neoplasm of breast: Secondary | ICD-10-CM

## 2024-06-04 ENCOUNTER — Telehealth: Payer: Self-pay | Admitting: Oncology

## 2024-06-04 NOTE — Telephone Encounter (Signed)
 [name] was driving yesterday while I booked her New patient appointment. She asked that I send her a My Chart message with the address. I called and LVM to confirm that I have sent the MyChart message to the correct person as I forgot to do so on yesterday. I asked that she return my call if she has any questions.

## 2024-06-09 ENCOUNTER — Ambulatory Visit (INDEPENDENT_AMBULATORY_CARE_PROVIDER_SITE_OTHER): Admitting: Gastroenterology

## 2024-06-09 DIAGNOSIS — D509 Iron deficiency anemia, unspecified: Secondary | ICD-10-CM | POA: Diagnosis not present

## 2024-06-09 DIAGNOSIS — Z7901 Long term (current) use of anticoagulants: Secondary | ICD-10-CM | POA: Diagnosis not present

## 2024-06-09 NOTE — Progress Notes (Unsigned)
 Capsule ID: [id number] Exp: 2024-11-27 LOT: [id number]   Patient arrived for Capsule Endoscopy. Reported the prep went well. This nurse explained dietary restrictions for the next few hours. Patient verbalized understanding. Opened capsule, ensured capsule was flashing prior to the patient swallowing the capsule. Patient swallowed capsule without difficulty. Patient instructed to return to the office at 4:00 pm today for removal of the recording equipment, to call the office with any questions and if no capsule was visualized after 72 hours. No further questions by the conclusion of the visit.

## 2024-06-09 NOTE — Patient Instructions (Signed)
 You may have a clear liquid diet at 10:30 am  You may have a light lunch beginning at 12:30 ( ex 1/2 sandwich and a bowl of soup, eggs and toast)  You may resume your normal diet at 5:00 pm  If you experience any abdominal pain, nausea ,or vomiting after ingesting the capsule please call [phone] to notify the nurse Alvy, RN).  You may not have an MRI until you have confirmation that you have passed the capsule.   Please return to the office by 4 PM today to return equipment.

## 2024-06-13 ENCOUNTER — Encounter: Admitting: Nurse Practitioner

## 2024-06-13 ENCOUNTER — Telehealth: Admitting: Nurse Practitioner

## 2024-06-13 ENCOUNTER — Encounter: Payer: Self-pay | Admitting: Nurse Practitioner

## 2024-06-13 DIAGNOSIS — J4489 Other specified chronic obstructive pulmonary disease: Secondary | ICD-10-CM

## 2024-06-13 DIAGNOSIS — J439 Emphysema, unspecified: Secondary | ICD-10-CM | POA: Diagnosis not present

## 2024-06-13 DIAGNOSIS — G4733 Obstructive sleep apnea (adult) (pediatric): Secondary | ICD-10-CM

## 2024-06-13 DIAGNOSIS — F17210 Nicotine dependence, cigarettes, uncomplicated: Secondary | ICD-10-CM | POA: Diagnosis not present

## 2024-06-13 NOTE — Progress Notes (Deleted)
 Patient ID: [name], female     DOB: 01/18/1961, 63 y.o.      MRN: [id number]  No chief complaint on file.     Allergies  Allergen Reactions   Benadryl  [Diphenhydramine  Hcl] Shortness Of Breath and Swelling    Tongue swelling   Codeine Other (See Comments)    Alters mood   Haldol [Haloperidol Decanoate] Anaphylaxis   Amoxicillin Hives and Swelling    Swelling to extremities Did it involve swelling of the face/tongue/throat, SOB, or low BP? Yes Did it involve sudden or severe rash/hives, skin peeling, or any reaction on the inside of your mouth or nose? Yes Did you need to seek medical attention at a hospital or doctor's office? Yes When did it last happen?    young adult   If all above answers are NO, may proceed with cephalosporin use.   Bee Venom Swelling and Other (See Comments)    pimples from head to toe and whole body swelling   Immunization History  Administered Date(s) Administered   Influenza, Seasonal, Injecte, Preservative Fre 06/19/2023   PNEUMOCOCCAL CONJUGATE-20 04/27/2023   Past Medical History:  Diagnosis Date   Allergy    Anemia    takes iron supplement   Arthritis    knees, shoulders   Asthma    Bipolar 1 disorder (HCC)    Blood transfusion without reported diagnosis    Cataract, immature    CHF (congestive heart failure) (HCC)    Clotting disorder    she denied any specific genetic bleeding/clotting diagnosis (10/05/23)   COPD (chronic obstructive pulmonary disease) (HCC)    Coronary artery disease    triple vessel   Depression    Dyspnea    Family history of adverse reaction to anesthesia    states father did not do well with anesthesia; died last time he had surgery; also had heart disease   Gallstones    GERD (gastroesophageal reflux disease)    occasional - no current med.   Glaucoma    Hammertoe 05/2015   right second toe   Heart murmur    Hepatic steatosis    High cholesterol    History of MI (myocardial  infarction)    Hypertension    states under control with meds., has been on med. x 8 yr.   Myocardial infarction (HCC)    Osteoporosis    Panic attacks    Peripheral vascular disease    carotid   Renal cyst, left    Schizophrenia (HCC)    Short of breath on exertion    Sleep apnea    Inspire in future   Urinary, incontinence, stress female    Wears dentures    upper   Wears partial dentures    lower    Tobacco History: Social History   Tobacco Use  Smoking Status Some Days   Current packs/day: 2.00   Average packs/day: 2.0 packs/day for 38.8 years (77.6 ttl pk-yrs)   Types: Cigarettes   Start date: 08/21/1985  Smokeless Tobacco Never  Tobacco Comments   10/23/2023 patient smoke a pack of cigarettes a month   1 cigarette when she feels like it   Ready to quit: Not Answered Counseling given: Not Answered Tobacco comments: 10/23/2023 patient smoke a pack of cigarettes a month 1 cigarette when she feels like it   Outpatient Medications Prior to Visit  Medication Sig Dispense Refill   albuterol  (PROVENTIL  HFA;VENTOLIN  HFA) 108 (90 BASE) MCG/ACT inhaler Inhale 2 puffs into  the lungs every 6 (six) hours as needed for wheezing or shortness of breath.      Alirocumab  (PRALUENT ) 150 MG/ML SOAJ Inject 1 mL (150 mg total) into the skin every 14 (fourteen) days. 6 mL 6   amLODipine  (NORVASC ) 5 MG tablet Take 1 tablet (5 mg total) by mouth daily. 90 tablet 3   aspirin  EC 81 MG tablet Take 1 tablet (81 mg total) by mouth daily. Swallow whole.     atorvastatin  (LIPITOR ) 80 MG tablet TAKE 1 TABLET BY MOUTH EVERY DAY 90 tablet 2   brimonidine  (ALPHAGAN ) 0.15 % ophthalmic solution Place 1 drop into both eyes 2 (two) times daily.      Budeson-Glycopyrrol-Formoterol  (BREZTRI  AEROSPHERE) 160-9-4.8 MCG/ACT AERO Inhale 2 puffs into the lungs in the morning and at bedtime. 10.7 each 5   clopidogrel  (PLAVIX ) 75 MG tablet Take 1 tablet (75 mg total) by mouth daily. 90 tablet 3   escitalopram   (LEXAPRO ) 20 MG tablet Take 20 mg by mouth daily.     ferrous sulfate  325 (65 FE) MG EC tablet Take 325 mg by mouth daily with breakfast.     fluticasone  (FLONASE ) 50 MCG/ACT nasal spray Place 2 sprays into both nostrils daily. 16 g 6   isosorbide  mononitrate (IMDUR ) 120 MG 24 hr tablet Take 1 tablet (120 mg total) by mouth daily. 90 tablet 1   lamoTRIgine  (LAMICTAL ) 100 MG tablet Take 100 mg by mouth daily.     metoprolol  tartrate (LOPRESSOR ) 25 MG tablet Take 0.5 tablets (12.5 mg total) by mouth 2 (two) times daily. 90 tablet 3   niacin  (NIASPAN ) 1000 MG CR tablet TAKE 1 TABLET (1,000 MG TOTAL) BY MOUTH AT BEDTIME. 90 tablet 3   nitroGLYCERIN  (NITROSTAT ) 0.4 MG SL tablet Place 1 tablet (0.4 mg total) under the tongue every 5 (five) minutes as needed for chest pain. 25 tablet 11   ondansetron  (ZOFRAN -ODT) 4 MG disintegrating tablet Take 1 tablet (4 mg total) by mouth every 8 (eight) hours as needed for nausea or vomiting. 20 tablet 0   pantoprazole  (PROTONIX ) 40 MG tablet TAKE 1 TABLET BY MOUTH TWICE A DAY 180 tablet 1   QUEtiapine  Fumarate 150 MG TABS Take 150 mg by mouth at bedtime.     ranolazine  (RANEXA ) 1000 MG SR tablet Take 1 tablet (1,000 mg total) by mouth 2 (two) times daily. 180 tablet 1   sucralfate  (CARAFATE ) 1 g tablet Take 1 tablet (1 g total) by mouth 4 (four) times daily -  with meals and at bedtime. Carafate  1 g 4 times daily x 2 weeks and  then twice daily for 1 month. Then may stop. 120 tablet 2   VASCEPA  1 g capsule TAKE 2 CAPSULES BY MOUTH 2 TIMES DAILY. 360 capsule 1   Vitamin D , Ergocalciferol , (DRISDOL) 1.25 MG (50000 UNIT) CAPS capsule Take 1 capsule (50,000 Units total) by mouth every 7 (seven) days. 4 capsule 11   VITAMIN E PO Take 1 tablet by mouth daily.     Facility-Administered Medications Prior to Visit  Medication Dose Route Frequency Provider Last Rate Last Admin   0.9 %  sodium chloride  infusion  500 mL Intravenous Once Armbruster, Elspeth SQUIBB, MD          Review of Systems:   Constitutional: No weight loss or gain, night sweats, fevers, chills, fatigue, or lassitude. HEENT: No headaches, difficulty swallowing, tooth/dental problems, or sore throat. No sneezing, itching, ear ache, nasal congestion, or post nasal drip CV:  No  chest pain, orthopnea, PND, swelling in lower extremities, anasarca, dizziness, palpitations, syncope Resp: No shortness of breath with exertion or at rest. No excess mucus or change in color of mucus. No productive or non-productive. No hemoptysis. No wheezing.  No chest wall deformity GI:  No heartburn, indigestion, abdominal pain, nausea, vomiting, diarrhea, change in bowel habits, loss of appetite, bloody stools.  GU: No dysuria, change in color of urine, urgency or frequency.  No flank pain, no hematuria  Skin: No rash, lesions, ulcerations MSK:  No joint pain or swelling.  No decreased range of motion.  No back pain. Neuro: No dizziness or lightheadedness.  Psych: No depression or anxiety. Mood stable.   Observations/Objective:   Assessment and Plan: No problem-specific Assessment & Plan notes found for this encounter.    Follow Up Instructions:    I discussed the assessment and treatment plan with the patient. The patient was provided an opportunity to ask questions and all were answered. The patient agreed with the plan and demonstrated an understanding of the instructions.   The patient was advised to call back or seek an in-person evaluation if the symptoms worsen or if the condition fails to improve as anticipated.  I provided *** minutes of non-face-to-face time during this encounter.   Comer LULLA Rouleau, NP

## 2024-06-13 NOTE — Assessment & Plan Note (Addendum)
 Severe OSA, intolerant of CPAP.  Inspire implanted 10/10/2023.  Delayed activation due to ongoing nerve healing; activation date 01/04/2024.  Tolerating well. She is receiving benefit from use. Residual fatigue is multifactorial. Decreased hours of use and failure to step up due to life stressors/family illness. Advised to step up to level 8, 2.1 V, today and continue to titrate up by 1 level every week unless discomfort occurs. No discomfort and comfortable tongue protrusion. Verbalized understanding. Encouraged to continue management of bipolar as per psychiatry. Understands remote use.  Understands to utilize nightly or anytime that she is napping.  Aware of risks of untreated sleep apnea.  Safe driving practices reviewed.  May postpone Inspire titration to allow her more time to work through levels and improve usage again. Will discuss with [facility] team.   Patient Instructions  Step up to level 8. You will increase next Friday, to level 9 and then every Friday by 1 level after this as tolerated. You do not necessarily have to work through all 11 levels. If you have any discomfort, you will back down by 1 level and stay there. Do not increase faster than once a week  Work on increasing your usage again for a minimum of 4 hours a night   We will likely postpone your Inspire titration study but I will call you Monday to let you know for sure    Follow up 11/21 at 1130 with Katie Giovanne Nickolson,NP as previously scheduled. If symptoms do not improve or worsen, please contact office for sooner follow up

## 2024-06-13 NOTE — Assessment & Plan Note (Signed)
 Stable without any recent exacerbations.  Continue triple therapy regimen.  Action plan in place.

## 2024-06-13 NOTE — Progress Notes (Signed)
 @Patient  ID: [name], female    DOB: [birth year], 62 y.o.   MRN: [id number]  No chief complaint on file.   Referring provider: Rena Luke POUR, MD Virtual Visit via Video Note  I connected with [name] on 06/13/24 at  3:00 PM EDT by a video enabled telemedicine application and verified that I am speaking with the correct person using two identifiers.  Location: Patient: Home Provider: Office   I discussed the limitations of evaluation and management by telemedicine and the availability of in person appointments. The patient expressed understanding and agreed to proceed.  History of Present Illness: HPI: 63 year old female, active smoker followed for COPD with chronic bronchitis and emphysema, and severe OSA treated with Inspire. She was last seen in office 05/02/2024. Past medical history significant for CAD s/p CABG, HTN, hx of NSTEMI, HLD, bipolar disorder, severe OSA, GERD.  TEST/EVENTS:  08/25/2007 PSG: AHI 20.6/h >> optimal CPAP pressure 7 cmH2O 10/14/2022 HST: AHI 33.2/h, SpO2 low 80% 06/11/2023 PFT: FVC 87, FEV1 71, ratio 69, TLC 102, DLCOcor 97. Moderate obstruction with reversibility   03/22/2022: OV with Dr. McQuaid. Coughing up white mucus for years; no change. Recently had a flare up after respiratory infection following exposure to sick kids. Treated with medications but can't remember the names. DOE most of her adult life. Daytime fatigue; snores a lot. Advanced cardiac disease, CAD. Previous CABG. Spirometry showed moderate airflow obstruction. Still smoking, likely contributing to symptoms. Noncompliant with Breo. Will switch to Anoro. Smoking cessation advised. Had CT outside - will request records. Split night sleep study ordered. Follow up 1 week.  05/02/2023: OV with Damarri Rampy NP for overdue follow up. Her breathing has been about the same since she was here last. No worse but no better. Not currently on any scheduled inhalers. She wasn't sure the Anoro really  helped her. Doesn't remember much about her response to Breo. She does use her albuterol  daily. No recent exacerbations requiring steroids/abx. She has a daily cough with white phlegm, which is unchanged. Notices an occasional wheeze. She does struggle with reflux. She has not been on anything recently. She has this regardless of what she eats but definitely avoids certain trigger foods. Denies fevers, chills, hemoptysis, leg swelling, orthopnea, anorexia, weight loss. No difficulties swallowing, abd pain, changes in bowel habits, hoarse voice. She is seeing an ENT for chronic sinusitis symptoms. No worse than normal.  She has had a repeat sleep study since she was here last. She has severe sleep apnea with AHI 33.2/h from [facility] 10/14/2022. She has significant daytime fatigue and restless sleep at night. Never feels rested. She was originally diagnosed with OSA in 2009. She was on CPAP but could not tolerate it even after a CPAP desensitization study. She tried multiple masks and setting adjustments. She has PTSD and is just unable to wear something on her face at night. She feels like her symptoms have gotten worse over the past few years. She is interested in [employer] device.   06/19/2023: OV with Dr. Neda.  Recently started on Breztri .  Symptoms are much better with Breztri  use.  Has not used albuterol  in the last month.  Very active.  Down to smoking about a pack a month.  Has a history of OSA, awaiting inspire device implantation.  Overall doing well.  01/04/2024: OV with Hedwig Mcfall,NP Patient presents today for inspire activation.  She had come in in March, 6 weeks postop, but implantation was delayed due to ongoing  nerve healing.  Her inspire was implanted 10/10/2023 by Dr. Soldatova.  Since the end of March, she has had significant improvement.  Not having any more pain or tenderness to the right side.  Still does have some tightness when she moves her neck a certain way or stretches her tongue up to her nose  but no discomfort.  Swallowing and speech are normal.  Incision sites have healed well. She continues to have ongoing issues with her sleep.  She has never felt rested.  Has significant daytime fatigue and restless sleep.  Has been told that she snores in the past.  Denies any drowsy driving or morning headaches.  No sleep parasomnia/paralysis. Breathing stable.  Still using Breztri .  No increased cough or wheezing. Ginny with Inspire present  05/02/2024: OV with Triniti Gruetzmacher NP for inspire titration follow up. She was supposed to be seen last month but when she came to her visit, she had not increased her levels on her Inspire at all so her appt was rescheduled. She has since been stepping up her therapy by 1 level each week, although she skipped last week as she had increased too quickly prior to this. She's currently on 1.7 V, level 4. No discomfort or issues with therapy. Her sleep is sometimes fragmented, due to insomnia and bioplar/mania symptoms; however, when she does sleep, it's much more restful with her Elizabeth and she feels like she's seeing an overall benefit in her energy levels with it as well. She has slurred speech and feels her tongue is a little swollen when she wakes up in the mornings, but this is when the device is still on and as soon as she turns it off, symptoms resolve. No residual swelling. No neck discomfort or changes in speech. No drowsy driving. She has no concerns or complaints today. Her breathing is stable. Still using Breztri . No increased cough or wheezing. No exacerbations. 04/02/2024-05/01/2024: Inspire Download Electrode A, 90/33 Delay 60 min, pause 15 min, duration 6 hr  1.4-2.4 V, current 1.7 V (Level 4) Avg use 6 hr 28 min, some nights as long as 8.5h 28/30 days; 73% >4 hr Average pause 0.3/night   06/13/2024: Today - follow up Discussed the use of AI scribe software for clinical note transcription with the patient, who gave verbal consent to proceed.  History of  Present Illness [name] is a 63 year old female who presents for follow-up regarding her Inspire device usage.  She uses her Inspire device most nights but only about fifty percent of the nights for more than four hours. Some nights of non-use are due to her mother's recent hospitalization in intensive care, which resulted in a lack of sleep. She feels comfortable with the device and doesn't have any issues with it waking her up or feeling too strong. She is currently using the device at level seven and has been on this for the last few weeks. She doesn't have any discomfort but with everything else going on, forgot to increase her level.   She has a history of severe sleep problems, which she attributes to her bipolar disorder causing hyperactivity at night. She experiences persistent fatigue, which she attributes to anemia, stating she is 'extremely tired all the time' and is awaiting a blood transfusion. Despite these issues, she feels 'absolutely' better since starting the Inspire device. She is not aware of any snoring. Does feel her sleep is more restful. No drowsy driving.   0/74/7974-89/76/7974: Inspire Download Electrode A, 90/33 Delay  60 min, pause 15 min, duration 9 hr  1.4-2.4 V, current 2 V (Level 7) 25/30 days (83%); 50% >4 hr; average use 4 hr 31 min Avg pause 0.4  Allergies  Allergen Reactions   Benadryl  [Diphenhydramine  Hcl] Shortness Of Breath and Swelling    Tongue swelling   Codeine Other (See Comments)    Alters mood   Haldol [Haloperidol Decanoate] Anaphylaxis   Amoxicillin Hives and Swelling    Swelling to extremities Did it involve swelling of the face/tongue/throat, SOB, or low BP? Yes Did it involve sudden or severe rash/hives, skin peeling, or any reaction on the inside of your mouth or nose? Yes Did you need to seek medical attention at a hospital or doctor's office? Yes When did it last happen?    young adult   If all above answers are NO, may  proceed with cephalosporin use.   Bee Venom Swelling and Other (See Comments)    pimples from head to toe and whole body swelling    Immunization History  Administered Date(s) Administered   Influenza, Seasonal, Injecte, Preservative Fre 06/19/2023   PNEUMOCOCCAL CONJUGATE-20 04/27/2023    Past Medical History:  Diagnosis Date   Allergy    Anemia    takes iron supplement   Arthritis    knees, shoulders   Asthma    Bipolar 1 disorder (HCC)    Blood transfusion without reported diagnosis    Cataract, immature    CHF (congestive heart failure) (HCC)    Clotting disorder    she denied any specific genetic bleeding/clotting diagnosis (10/05/23)   COPD (chronic obstructive pulmonary disease) (HCC)    Coronary artery disease    triple vessel   Depression    Dyspnea    Family history of adverse reaction to anesthesia    states father did not do well with anesthesia; died last time he had surgery; also had heart disease   Gallstones    GERD (gastroesophageal reflux disease)    occasional - no current med.   Glaucoma    Hammertoe 05/2015   right second toe   Heart murmur    Hepatic steatosis    High cholesterol    History of MI (myocardial infarction)    Hypertension    states under control with meds., has been on med. x 8 yr.   Myocardial infarction (HCC)    Osteoporosis    Panic attacks    Peripheral vascular disease    carotid   Renal cyst, left    Schizophrenia (HCC)    Short of breath on exertion    Sleep apnea    Inspire in future   Urinary, incontinence, stress female    Wears dentures    upper   Wears partial dentures    lower    Tobacco History: Social History   Tobacco Use  Smoking Status Some Days   Current packs/day: 2.00   Average packs/day: 2.0 packs/day for 38.8 years (77.6 ttl pk-yrs)   Types: Cigarettes   Start date: 08/21/1985  Smokeless Tobacco Never  Tobacco Comments   10/23/2023 patient smoke a pack of cigarettes a month   1  cigarette when she feels like it   Ready to quit: Not Answered Counseling given: Not Answered Tobacco comments: 10/23/2023 patient smoke a pack of cigarettes a month 1 cigarette when she feels like it   Outpatient Medications Prior to Visit  Medication Sig Dispense Refill   albuterol  (PROVENTIL  HFA;VENTOLIN  HFA) 108 (90 BASE) MCG/ACT inhaler Inhale  2 puffs into the lungs every 6 (six) hours as needed for wheezing or shortness of breath.      Alirocumab  (PRALUENT ) 150 MG/ML SOAJ Inject 1 mL (150 mg total) into the skin every 14 (fourteen) days. 6 mL 6   amLODipine  (NORVASC ) 5 MG tablet Take 1 tablet (5 mg total) by mouth daily. 90 tablet 3   aspirin  EC 81 MG tablet Take 1 tablet (81 mg total) by mouth daily. Swallow whole.     atorvastatin  (LIPITOR ) 80 MG tablet TAKE 1 TABLET BY MOUTH EVERY DAY 90 tablet 2   brimonidine  (ALPHAGAN ) 0.15 % ophthalmic solution Place 1 drop into both eyes 2 (two) times daily.      Budeson-Glycopyrrol-Formoterol  (BREZTRI  AEROSPHERE) 160-9-4.8 MCG/ACT AERO Inhale 2 puffs into the lungs in the morning and at bedtime. 10.7 each 5   clopidogrel  (PLAVIX ) 75 MG tablet Take 1 tablet (75 mg total) by mouth daily. 90 tablet 3   escitalopram  (LEXAPRO ) 20 MG tablet Take 20 mg by mouth daily.     ferrous sulfate  325 (65 FE) MG EC tablet Take 325 mg by mouth daily with breakfast.     fluticasone  (FLONASE ) 50 MCG/ACT nasal spray Place 2 sprays into both nostrils daily. 16 g 6   isosorbide  mononitrate (IMDUR ) 120 MG 24 hr tablet Take 1 tablet (120 mg total) by mouth daily. 90 tablet 1   lamoTRIgine  (LAMICTAL ) 100 MG tablet Take 100 mg by mouth daily.     metoprolol  tartrate (LOPRESSOR ) 25 MG tablet Take 0.5 tablets (12.5 mg total) by mouth 2 (two) times daily. 90 tablet 3   niacin  (NIASPAN ) 1000 MG CR tablet TAKE 1 TABLET (1,000 MG TOTAL) BY MOUTH AT BEDTIME. 90 tablet 3   nitroGLYCERIN  (NITROSTAT ) 0.4 MG SL tablet Place 1 tablet (0.4 mg total) under the tongue every 5 (five)  minutes as needed for chest pain. 25 tablet 11   ondansetron  (ZOFRAN -ODT) 4 MG disintegrating tablet Take 1 tablet (4 mg total) by mouth every 8 (eight) hours as needed for nausea or vomiting. 20 tablet 0   pantoprazole  (PROTONIX ) 40 MG tablet TAKE 1 TABLET BY MOUTH TWICE A DAY 180 tablet 1   QUEtiapine  Fumarate 150 MG TABS Take 150 mg by mouth at bedtime.     ranolazine  (RANEXA ) 1000 MG SR tablet Take 1 tablet (1,000 mg total) by mouth 2 (two) times daily. 180 tablet 1   sucralfate  (CARAFATE ) 1 g tablet Take 1 tablet (1 g total) by mouth 4 (four) times daily -  with meals and at bedtime. Carafate  1 g 4 times daily x 2 weeks and  then twice daily for 1 month. Then may stop. 120 tablet 2   VASCEPA  1 g capsule TAKE 2 CAPSULES BY MOUTH 2 TIMES DAILY. 360 capsule 1   Vitamin D , Ergocalciferol , (DRISDOL) 1.25 MG (50000 UNIT) CAPS capsule Take 1 capsule (50,000 Units total) by mouth every 7 (seven) days. 4 capsule 11   VITAMIN E PO Take 1 tablet by mouth daily.     Facility-Administered Medications Prior to Visit  Medication Dose Route Frequency Provider Last Rate Last Admin   0.9 %  sodium chloride  infusion  500 mL Intravenous Once Armbruster, Elspeth SQUIBB, MD         Review of Systems: as above    Physical Exam: Patient is well-developed, well-nourished in no acute distress.  Resting comfortably at home.  No labored breathing.  Speech is clear and coherent with logical content.  Patient is alert  and oriented at baseline.   Lab Results:  CBC    Component Value Date/Time   WBC 7.7 05/26/2024 1519   RBC 4.26 05/26/2024 1519   HGB 10.6 (L) 05/26/2024 1519   HCT 33.5 (L) 05/26/2024 1519   PLT 232.0 05/26/2024 1519   MCV 78.5 05/26/2024 1519   MCH 28.5 02/18/2024 1721   MCHC 31.7 05/26/2024 1519   RDW 21.9 (H) 05/26/2024 1519   LYMPHSABS 2.7 05/26/2024 1519   MONOABS 0.5 05/26/2024 1519   EOSABS 0.2 05/26/2024 1519   BASOSABS 0.1 05/26/2024 1519    BMET    Component Value  Date/Time   NA 142 05/26/2024 1519   NA 143 03/27/2024 1541   K 3.8 05/26/2024 1519   CL 106 05/26/2024 1519   CO2 24 05/26/2024 1519   GLUCOSE 110 (H) 05/26/2024 1519   BUN 11 05/26/2024 1519   BUN 9 03/27/2024 1541   CREATININE 1.16 05/26/2024 1519   CREATININE 1.10 (H) 02/11/2016 0921   CALCIUM  9.1 05/26/2024 1519   GFRNONAA 44 (L) 02/18/2024 1721   GFRAA >60 03/07/2020 0313    BNP No results found for: BNP   Imaging:  No results found.   Administration History     None          Latest Ref Rng & Units 06/11/2023    1:06 PM  PFT Results  FVC-Pre L 2.65   FVC-Predicted Pre % 87   FVC-Post L 2.73   FVC-Predicted Post % 89   Pre FEV1/FVC % % 63   Post FEV1/FCV % % 69   FEV1-Pre L 1.67   FEV1-Predicted Pre % 71   FEV1-Post L 1.88   DLCO uncorrected ml/min/mmHg 18.42   DLCO UNC% % 97   DLCO corrected ml/min/mmHg 18.42   DLCO COR %Predicted % 97   DLVA Predicted % 110   TLC L 4.87   TLC % Predicted % 102   RV % Predicted % 109     No results found for: NITRICOXIDE      Assessment & Plan:   OSA (obstructive sleep apnea) Severe OSA, intolerant of CPAP.  Inspire implanted 10/10/2023.  Delayed activation due to ongoing nerve healing; activation date 01/04/2024.  Tolerating well. She is receiving benefit from use. Residual fatigue is multifactorial. Decreased hours of use and failure to step up due to life stressors/family illness. Advised to step up to level 8, 2.1 V, today and continue to titrate up by 1 level every week unless discomfort occurs. No discomfort and comfortable tongue protrusion. Verbalized understanding. Encouraged to continue management of bipolar as per psychiatry. Understands remote use.  Understands to utilize nightly or anytime that she is napping.  Aware of risks of untreated sleep apnea.  Safe driving practices reviewed.  May postpone Inspire titration to allow her more time to work through levels and improve usage again. Will  discuss with [facility] team.   Patient Instructions  Step up to level 8. You will increase next Friday, to level 9 and then every Friday by 1 level after this as tolerated. You do not necessarily have to work through all 11 levels. If you have any discomfort, you will back down by 1 level and stay there. Do not increase faster than once a week  Work on increasing your usage again for a minimum of 4 hours a night   We will likely postpone your Inspire titration study but I will call you Monday to let you know for sure  Follow up 11/21 at 1130 with Katie Quinisha Mould,NP as previously scheduled. If symptoms do not improve or worsen, please contact office for sooner follow up    COPD with chronic bronchitis and emphysema (HCC) Stable without any recent exacerbations.  Continue triple therapy regimen.  Action plan in place.   I discussed the assessment and treatment plan with the patient. The patient was provided an opportunity to ask questions and all were answered. The patient agreed with the plan and demonstrated an understanding of the instructions.   The patient was advised to call back or seek an in-person evaluation if the symptoms worsen or if the condition fails to improve as anticipated.   I provided 25 minutes of non-face-to-face time during this encounter.  Comer LULLA Rouleau, NP 06/13/2024  Pt aware and understands NP's role.

## 2024-06-13 NOTE — Patient Instructions (Addendum)
 Step up to level 8. You will increase next Friday, to level 9 and then every Friday by 1 level after this as tolerated. You do not necessarily have to work through all 11 levels. If you have any discomfort, you will back down by 1 level and stay there. Do not increase faster than once a week  Work on increasing your usage again for a minimum of 4 hours a night   We will likely postpone your Inspire titration study but I will call you Monday to let you know for sure    Follow up 11/21 at 1130 with Katie Jarrette Dehner,NP as previously scheduled. If symptoms do not improve or worsen, please contact office for sooner follow up

## 2024-06-17 ENCOUNTER — Encounter (HOSPITAL_BASED_OUTPATIENT_CLINIC_OR_DEPARTMENT_OTHER): Admitting: Pulmonary Disease

## 2024-06-17 ENCOUNTER — Ambulatory Visit

## 2024-06-18 ENCOUNTER — Telehealth: Payer: Self-pay

## 2024-06-18 ENCOUNTER — Other Ambulatory Visit: Payer: Self-pay

## 2024-06-18 NOTE — Telephone Encounter (Signed)
 Called patient to relay a message from the scheduling department regarding her upcoming appointment.

## 2024-06-24 ENCOUNTER — Telehealth: Payer: Self-pay | Admitting: Gastroenterology

## 2024-06-24 NOTE — Telephone Encounter (Signed)
 Capsule endoscopy performed on October 20 and interpreted.  It is a normal exam, no clear cause for iron deficiency.   She has been referred for IV iron.  Jan - can you let the patient know her capsule study is okay, and see if she has had IV iron yet?  If not can we refer her to the infusion center directly for IV iron infusion and repeat CBC and iron studies in 2 months or so?  Thanks

## 2024-06-25 ENCOUNTER — Encounter: Payer: Self-pay | Admitting: Emergency Medicine

## 2024-06-25 ENCOUNTER — Ambulatory Visit: Admission: EM | Admit: 2024-06-25 | Discharge: 2024-06-25 | Disposition: A | Discharge status: Home / Self Care

## 2024-06-25 DIAGNOSIS — N23 Unspecified renal colic: Secondary | ICD-10-CM

## 2024-06-25 DIAGNOSIS — M549 Dorsalgia, unspecified: Secondary | ICD-10-CM

## 2024-06-25 LAB — POCT URINE DIPSTICK
Bilirubin, UA: NEGATIVE
Glucose, UA: NEGATIVE mg/dL
Ketones, POC UA: NEGATIVE mg/dL
Leukocytes, UA: NEGATIVE
Nitrite, UA: NEGATIVE
POC PROTEIN,UA: NEGATIVE
Spec Grav, UA: 1.01 (ref 1.010–1.025)
Urobilinogen, UA: 0.2 U/dL
pH, UA: 6 (ref 5.0–8.0)

## 2024-06-25 MED ORDER — TAMSULOSIN HCL 0.4 MG PO CAPS: 0.4000 mg | ORAL_CAPSULE | Freq: Every day | ORAL | 0 refills | Status: AC

## 2024-06-25 MED ORDER — TRAMADOL HCL 50 MG PO TABS: 50.0000 mg | ORAL_TABLET | Freq: Four times a day (QID) | ORAL | 0 refills | Status: DC | PRN

## 2024-06-25 NOTE — Telephone Encounter (Signed)
 Called and left message for patient with results and recs. Asked her to call back to see if she has started on IV iron

## 2024-06-25 NOTE — Discharge Instructions (Addendum)
 Call urologist, if you do not see stone pass in the next 7 days or pain increases  Increase water intake, drink about 1/2 a gallon or more a day if you can

## 2024-06-25 NOTE — ED Triage Notes (Signed)
 Pt reports R-sided mid-back pain x3 weeks. Back pain wraps around into her abdomen. Describes dull, aching pressure. Pt reports being told by GI doctor that she has lesions on her R kidney - has nephrology appt in Jan 2026. Unsure if this is related.  Pain is worse with sitting versus standing. Tylenol , back exercises, biofreeze, and heat with no relief. Heat actually made the pain worse. No injuries or trauma to area. Denies dysuria, frequency, and hematuria. Denies fever. Cannot take NSAIDs.

## 2024-06-25 NOTE — ED Provider Notes (Signed)
 [name]    CSN: [id number] Arrival date & time: 06/25/24  1505      History   Chief Complaint Chief Complaint  Patient presents with   Back Pain    HPI [name] is a 63 y.o. female.   Patient with a history of multiple chronic diseases, presents today due to right sided back pain for the past 3 weeks.  Patient states that the pain radiates around to her right lower quadrant and she finds it difficult to find a comfortable position to sleep in.  Patient states that her urine has been dark on and off for a year but denies that it is malodorous.  Patient denies dysuria or urinary frequency.  Patient denies fever or chills.  Nausea and vomiting is baseline for her, has not noticed an increase or change in nausea or vomiting in the last few weeks.  The history is provided by the patient.  Back Pain   Past Medical History:  Diagnosis Date   Allergy    Anemia    takes iron supplement   Arthritis    knees, shoulders   Asthma    Bipolar 1 disorder (HCC)    Blood transfusion without reported diagnosis    Cataract, immature    CHF (congestive heart failure) (HCC)    Clotting disorder    she denied any specific genetic bleeding/clotting diagnosis (10/05/23)   COPD (chronic obstructive pulmonary disease) (HCC)    Coronary artery disease    triple vessel   Depression    Dyspnea    Family history of adverse reaction to anesthesia    states father did not do well with anesthesia; died last time he had surgery; also had heart disease   Gallstones    GERD (gastroesophageal reflux disease)    occasional - no current med.   Glaucoma    Hammertoe 05/2015   right second toe   Heart murmur    Hepatic steatosis    High cholesterol    History of MI (myocardial infarction)    Hypertension    states under control with meds., has been on med. x 8 yr.   Myocardial infarction [facility])    Osteoporosis    Panic attacks    Peripheral vascular disease    carotid    Renal cyst, left    Schizophrenia (HCC)    Short of breath on exertion    Sleep apnea    Inspire in future   Urinary, incontinence, stress female    Wears dentures    upper   Wears partial dentures    lower    Patient Active Problem List   Diagnosis Date Noted   Angina of effort 01/28/2024   Gastritis and gastroduodenitis 01/07/2024   Multiple gastric ulcers 01/07/2024   GI bleeding 01/06/2024   Intolerance of continuous positive airway pressure (CPAP) ventilation 10/10/2023   Symptomatic anemia 06/30/2023   Postoperative or surgical complication 06/30/2023   GERD (gastroesophageal reflux disease) 05/02/2023   OSA (obstructive sleep apnea) 05/02/2023   Shoulder pain 02/19/2023   Calculus of gallbladder without cholecystitis without obstruction 12/25/2022   RUQ abdominal pain 12/25/2022   Dysphagia 12/25/2022   History of Helicobacter pylori infection 12/25/2022   Sensorineural hearing loss (SNHL) of both ears 12/06/2020   Tinnitus of both ears 12/06/2020   Prediabetes 12/02/2020   Vitamin D  deficiency 12/02/2020   Chronic constipation 04/15/2020   Abdominal bloating 03/24/2020   Tobacco abuse 11/04/2019   Elevated alkaline phosphatase  level 11/04/2019   CAD S/P percutaneous coronary angioplasty 07/03/2019   Hx of CABG 07/03/2019   Neck pain 07/01/2019   History of non-ST elevation myocardial infarction (NSTEMI)    Right foot pain 01/01/2017   Leukocytosis 07/10/2016   COPD (chronic obstructive pulmonary disease) (HCC) 09/24/2015   Bipolar disorder (HCC) 09/24/2015   Glaucoma 09/24/2015   Mixed incontinence 09/24/2015   Renal cyst 09/24/2015   Right knee pain 09/24/2015   Hypertension 03/17/2015   Hyperlipidemia LDL goal <70 12/24/2013   Coronary artery disease of native heart with stable angina pectoris 12/24/2013   Expected blood loss anemia 10/22/2012   Morbid obesity (HCC) 10/22/2012   S/P right TKA 10/21/2012    Past Surgical History:  Procedure Laterality  Date   C-SECTIONS X 2  1985, 1988   CARDIAC CATHETERIZATION  12/10/2006; 12/18/2006; 07/10/2007; 07/19/2009   CARDIAC CATHETERIZATION  03/04/2010   patent grafts, normal LV function, diffusely diseased RCA (Dr. DOROTHA Lesches)   CARDIAC CATHETERIZATION N/A 02/15/2016   Procedure: Left Heart Cath and Cors/Grafts Angiography;  Surgeon: Peter M Jordan, MD;  Location: [facility] INVASIVE CV LAB;  Service: Cardiovascular;  Laterality: N/A;   CHOLECYSTECTOMY N/A 06/29/2023   Procedure: LAPAROSCOPIC CHOLECYSTECTOMY WITH ICG DYE;  Surgeon: Tanda Locus, MD;  Location: [facility];  Service: General;  Laterality: N/A;   CLUB FOOT RELEASE Bilateral    1st and 2nd toes   COLONOSCOPY     CORONARY ARTERY BYPASS GRAFT  07/16/2007   LIMA to LAD, VG to ramus, VG to PDA    CORONARY STENT INTERVENTION N/A 06/23/2019   Procedure: CORONARY STENT INTERVENTION;  Surgeon: Lesches Dorn PARAS, MD;  Location: [facility] INVASIVE CV LAB;  Service: Cardiovascular;  Laterality: N/A;   DRUG INDUCED ENDOSCOPY N/A 05/17/2023   Procedure: DRUG INDUCED ENDOSCOPY;  Surgeon: Jude Harden GAILS, MD;  Location: [facility] ENDOSCOPY;  Service: Pulmonary;  Laterality: N/A;   ESOPHAGOGASTRODUODENOSCOPY N/A 01/07/2024   Procedure: EGD (ESOPHAGOGASTRODUODENOSCOPY);  Surgeon: Wilhelmenia Aloha Raddle., MD;  Location: [facility];  Service: Gastroenterology;  Laterality: N/A;   FLEXIBLE BRONCHOSCOPY  07/16/2007   HAMMERTOE RECONSTRUCTION WITH WEIL OSTEOTOMY Right 06/03/2015   Procedure: RIGHT SECOND HAMMERTOE CORRECTION WITH MT WEIL OSTEOTOMY;  Surgeon: Norleen Armor, MD;  Location: [facility];  Service: Orthopedics;  Laterality: Right;   HAND SURGERY Bilateral    1st and 2nd fingers - release of clubbing   IMPLANTATION OF HYPOGLOSSAL NERVE STIMULATOR N/A 10/10/2023   Procedure: IMPLANTATION OF HYPOGLOSSAL NERVE STIMULATOR;  Surgeon: Okey Burns, MD;  Location: [facility];  Service: ENT;  Laterality: N/A;  right   KNEE ARTHROSCOPY WITH ANTERIOR CRUCIATE LIGAMENT  (ACL) REPAIR Right 01/30/2008   LEFT HEART CATH AND CORS/GRAFTS ANGIOGRAPHY N/A 06/23/2019   Procedure: LEFT HEART CATH AND CORS/GRAFTS ANGIOGRAPHY;  Surgeon: Lesches Dorn PARAS, MD;  Location: [facility] CV LAB;  Service: Cardiovascular;  Laterality: N/A;   LEFT HEART CATH AND CORS/GRAFTS ANGIOGRAPHY N/A 03/01/2020   Procedure: LEFT HEART CATH AND CORS/GRAFTS ANGIOGRAPHY;  Surgeon: Claudene Victory ORN, MD;  Location: [facility];  Service: Cardiovascular;  Laterality: N/A;   NOVASURE ABLATION  12/19/2007   with removal of IUD   ORIF FOOT FRACTURE Left    TOE SURGERIES TO REMOVE NAILS DUE TO CLUBBING     TOTAL KNEE ARTHROPLASTY Right 10/21/2012   Procedure: TOTAL KNEE ARTHROPLASTY;  Surgeon: Donnice JONETTA Car, MD;  Location: [facility];  Service: Orthopedics;  Laterality: Right;   UPPER GASTROINTESTINAL ENDOSCOPY  OB History   No obstetric history on file.      Home Medications    Prior to Admission medications   Medication Sig Start Date End Date Taking? Authorizing Provider  albuterol  (PROVENTIL  HFA;VENTOLIN  HFA) 108 (90 BASE) MCG/ACT inhaler Inhale 2 puffs into the lungs every 6 (six) hours as needed for wheezing or shortness of breath.    Yes [provider]  Alirocumab  (PRALUENT ) 150 MG/ML SOAJ Inject 1 mL (150 mg total) into the skin every 14 (fourteen) days. 07/10/23  Yes Jerilynn Lamarr HERO, NP  amLODipine  (NORVASC ) 5 MG tablet Take 1 tablet (5 mg total) by mouth daily. 01/24/24  Yes Court Dorn PARAS, MD  aspirin  EC 81 MG tablet Take 1 tablet (81 mg total) by mouth daily. Swallow whole. 01/08/24 01/07/25 Yes Vann, Jessica U, DO  atorvastatin  (LIPITOR ) 80 MG tablet TAKE 1 TABLET BY MOUTH EVERY DAY 05/02/24  Yes Court Dorn PARAS, MD  brimonidine  (ALPHAGAN ) 0.15 % ophthalmic solution Place 1 drop into both eyes 2 (two) times daily.    Yes [provider]  Budeson-Glycopyrrol-Formoterol  (BREZTRI  AEROSPHERE) 160-9-4.8 MCG/ACT AERO Inhale 2 puffs into the lungs in the  morning and at bedtime. 04/30/23  Yes Cobb, Comer GAILS, NP  clopidogrel  (PLAVIX ) 75 MG tablet Take 1 tablet (75 mg total) by mouth daily. 04/07/24 04/07/25 Yes Fountain, Madison L, NP  escitalopram  (LEXAPRO ) 20 MG tablet Take 20 mg by mouth daily. 05/31/19  Yes [provider]  ferrous sulfate  325 (65 FE) MG EC tablet Take 325 mg by mouth daily with breakfast. 03/11/24  Yes [provider]  isosorbide  mononitrate (IMDUR ) 120 MG 24 hr tablet Take 1 tablet (120 mg total) by mouth daily. 03/20/24  Yes Fountain, Madison L, NP  lamoTRIgine  (LAMICTAL ) 100 MG tablet Take 100 mg by mouth daily. 08/05/19  Yes [provider]  metoprolol  tartrate (LOPRESSOR ) 25 MG tablet Take 0.5 tablets (12.5 mg total) by mouth 2 (two) times daily. 01/24/24  Yes Court Dorn PARAS, MD  niacin  (NIASPAN ) 1000 MG CR tablet TAKE 1 TABLET (1,000 MG TOTAL) BY MOUTH AT BEDTIME. 03/17/24  Yes Court Dorn PARAS, MD  nitroGLYCERIN  (NITROSTAT ) 0.4 MG SL tablet Place 1 tablet (0.4 mg total) under the tongue every 5 (five) minutes as needed for chest pain. 01/24/24  Yes Court Dorn PARAS, MD  ondansetron  (ZOFRAN -ODT) 4 MG disintegrating tablet Take 1 tablet (4 mg total) by mouth every 8 (eight) hours as needed for nausea or vomiting. 01/20/24  Yes Garrick Charleston, MD  pantoprazole  (PROTONIX ) 40 MG tablet TAKE 1 TABLET BY MOUTH TWICE A DAY 05/26/24  Yes [place], PA-C  QUEtiapine  Fumarate 150 MG TABS Take 150 mg by mouth at bedtime.   Yes [provider]  ranolazine  (RANEXA ) 1000 MG SR tablet Take 1 tablet (1,000 mg total) by mouth 2 (two) times daily. 03/20/24  Yes Fountain, Madison L, NP  sucralfate  (CARAFATE ) 1 g tablet Take 1 tablet (1 g total) by mouth 4 (four) times daily -  with meals and at bedtime. Carafate  1 g 4 times daily x 2 weeks and  then twice daily for 1 month. Then may stop. 02/27/24 06/25/24 Yes [place], PA-C  tamsulosin (FLOMAX) 0.4 MG CAPS capsule Take 1 capsule (0.4 mg total) by mouth daily  for 10 days. 06/25/24 07/05/24 Yes Andra Corean BROCKS, PA-C  traMADol  (ULTRAM ) 50 MG tablet Take 1 tablet (50 mg total) by mouth every 6 (six) hours as needed. 06/25/24  Yes Andra Corean BROCKS,  PA-C  VASCEPA  1 g capsule TAKE 2 CAPSULES BY MOUTH 2 TIMES DAILY. 05/16/24  Yes Court Dorn PARAS, MD  Vitamin D , Ergocalciferol , (DRISDOL) 1.25 MG (50000 UNIT) CAPS capsule Take 1 capsule (50,000 Units total) by mouth every 7 (seven) days. 05/30/24 05/30/25 Yes [place], PA-C  VITAMIN E PO Take 1 tablet by mouth daily.   Yes [provider]  fluticasone  (FLONASE ) 50 MCG/ACT nasal spray Place 2 sprays into both nostrils daily. 05/30/23   Okey Burns, MD  Na Sulfate-K Sulfate-Mg Sulfate concentrate (SUPREP) 17.5-3.13-1.6 GM/177ML SOLN SMARTSIG:1 Kit(s) By Mouth Once Patient not taking: Reported on 06/25/2024 05/01/24   [provider]    Family History Family History  Problem Relation Age of Onset   Lung cancer Mother    Kidney cancer Mother    Heart disease Father    Anesthesia problems Father        states did not do well with anesthesia   Colon polyps Sister    Breast cancer Maternal Aunt    Breast cancer Maternal Aunt    Stomach cancer Neg Hx    Esophageal cancer Neg Hx    Pancreatic cancer Neg Hx    Rectal cancer Neg Hx    Colon cancer Neg Hx     Social History Social History   Tobacco Use   Smoking status: Some Days    Current packs/day: 2.00    Average packs/day: 2.0 packs/day for 38.8 years (77.7 ttl pk-yrs)    Types: Cigarettes    Start date: 08/21/1985    Passive exposure: Current   Smokeless tobacco: Never   Tobacco comments:    10/23/2023 patient smoke a pack of cigarettes a month    1 cigarette when she feels like it  Vaping Use   Vaping status: Never Used  Substance Use Topics   Alcohol use: No   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     Physical Exam Triage Vital Signs ED Triage Vitals [06/25/24 1620]  Encounter Vitals Group     BP (!) 135/104     Girls Systolic BP Percentile      Girls Diastolic BP Percentile      Boys Systolic BP Percentile      Boys Diastolic BP Percentile      Pulse Rate (!) 58     Resp 16     Temp 97.7 F (36.5 C)     Temp Source Oral     SpO2 97 %     Weight      Height      Head Circumference      Peak Flow      Pain Score 10     Pain Loc      Pain Education      Exclude from Growth Chart    No data found.  Updated Vital Signs BP (!) 135/104 (BP Location: Left Arm)   Pulse (!) 58   Temp 97.7 F (36.5 C) (Oral)   Resp 16   LMP 11/16/2011   SpO2 97%   Visual Acuity Right Eye Distance:   Left Eye Distance:   Bilateral Distance:    Right Eye Near:   Left Eye Near:    Bilateral Near:  Physical Exam Vitals and nursing note reviewed.  Constitutional:      General: She is not in acute distress.    Appearance: Normal appearance. She is not ill-appearing, toxic-appearing or diaphoretic.  Eyes:     General: No scleral icterus. Cardiovascular:     Rate and Rhythm: Normal rate and regular rhythm.     Heart sounds: Normal heart sounds.  Pulmonary:     Effort: Pulmonary effort is normal. No respiratory distress.     Breath sounds: Normal breath sounds. No wheezing or rhonchi.  Abdominal:     General: Abdomen is flat. Bowel sounds are normal.     Palpations: Abdomen is soft.     Tenderness: There is abdominal tenderness in the right upper quadrant and suprapubic area. There is right CVA tenderness. There is no left CVA tenderness.  Skin:    General: Skin is warm.  Neurological:     Mental Status: She is alert and oriented to person, place, and time.  Psychiatric:        Mood and Affect: Mood normal.        Behavior: Behavior normal.      UC  Treatments / Results  Labs (all labs ordered are listed, but only abnormal results are displayed) Labs Reviewed  POCT URINE DIPSTICK - Abnormal; Notable for the following components:      Result Value   Blood, UA trace-lysed (*)    All other components within normal limits    EKG   Radiology No results found.  Procedures Procedures (including critical care time)  Medications Ordered in UC Medications - No data to display  Initial Impression / Assessment and Plan / UC Course  I have reviewed the triage vital signs and the nursing notes.  Pertinent labs & imaging results that were available during my care of the patient were reviewed by me and considered in my medical decision making (see chart for details).      Final Clinical Impressions(s) / UC Diagnoses   Final diagnoses:  Acute back pain, unspecified back location, unspecified back pain laterality  Renal colic on right side     Discharge Instructions      Call urologist, if you do not see stone pass in the next 7 days or pain increases  Increase water intake, drink about 1/2 a gallon or more a day if you can     ED Prescriptions     Medication Sig Dispense Auth. Provider   tamsulosin (FLOMAX) 0.4 MG CAPS capsule Take 1 capsule (0.4 mg total) by mouth daily for 10 days. 10 capsule Andra Krabbe C, PA-C   traMADol  (ULTRAM ) 50 MG tablet Take 1 tablet (50 mg total) by mouth every 6 (six) hours as needed. 15 tablet Andra Krabbe BROCKS, PA-C      I have reviewed the PDMP during this encounter.   Andra Krabbe BROCKS, PA-C 06/25/24 [id number]

## 2024-06-27 ENCOUNTER — Emergency Department (HOSPITAL_COMMUNITY)
Admission: EM | Admit: 2024-06-27 | Discharge: 2024-06-27 | Disposition: A | Discharge status: Home / Self Care | Attending: Emergency Medicine | Admitting: Emergency Medicine

## 2024-06-27 ENCOUNTER — Encounter (HOSPITAL_COMMUNITY): Payer: Self-pay

## 2024-06-27 ENCOUNTER — Encounter: Payer: Self-pay | Admitting: Gastroenterology

## 2024-06-27 ENCOUNTER — Other Ambulatory Visit: Payer: Self-pay

## 2024-06-27 ENCOUNTER — Emergency Department (HOSPITAL_COMMUNITY)

## 2024-06-27 DIAGNOSIS — S63502A Unspecified sprain of left wrist, initial encounter: Secondary | ICD-10-CM | POA: Diagnosis not present

## 2024-06-27 DIAGNOSIS — J449 Chronic obstructive pulmonary disease, unspecified: Secondary | ICD-10-CM | POA: Insufficient documentation

## 2024-06-27 DIAGNOSIS — I2510 Atherosclerotic heart disease of native coronary artery without angina pectoris: Secondary | ICD-10-CM | POA: Insufficient documentation

## 2024-06-27 DIAGNOSIS — I509 Heart failure, unspecified: Secondary | ICD-10-CM | POA: Diagnosis not present

## 2024-06-27 DIAGNOSIS — Z7902 Long term (current) use of antithrombotics/antiplatelets: Secondary | ICD-10-CM | POA: Diagnosis not present

## 2024-06-27 DIAGNOSIS — X509XXA Other and unspecified overexertion or strenuous movements or postures, initial encounter: Secondary | ICD-10-CM | POA: Insufficient documentation

## 2024-06-27 DIAGNOSIS — Z7982 Long term (current) use of aspirin: Secondary | ICD-10-CM | POA: Insufficient documentation

## 2024-06-27 DIAGNOSIS — Z7951 Long term (current) use of inhaled steroids: Secondary | ICD-10-CM | POA: Diagnosis not present

## 2024-06-27 DIAGNOSIS — M25532 Pain in left wrist: Secondary | ICD-10-CM | POA: Diagnosis present

## 2024-06-27 DIAGNOSIS — Z79899 Other long term (current) drug therapy: Secondary | ICD-10-CM | POA: Insufficient documentation

## 2024-06-27 DIAGNOSIS — I110 Hypertensive heart disease with heart failure: Secondary | ICD-10-CM | POA: Diagnosis not present

## 2024-06-27 MED ORDER — ACETAMINOPHEN 325 MG PO TABS
650.0000 mg | ORAL_TABLET | Freq: Once | ORAL | Status: AC
  Administered 2024-06-27: 650 mg via ORAL
  Filled 2024-06-27: qty 2

## 2024-06-27 NOTE — ED Triage Notes (Signed)
 Patient presents with swelling to the left wrist that started this morning. She denies falling or injuring the wrist. She states lastnight she carried cat litter into the home.

## 2024-06-27 NOTE — ED Provider Notes (Signed)
 [place] EMERGENCY DEPARTMENT AT [facility] Provider Note   CSN: [id number] Arrival date & time: 06/27/24  8096     Patient presents with: Wrist Pain   [name] is a 63 y.o. female with PMHx anemia, CHF, COPD, CAD, GERD, HLD, HTN who presents ED concern for left wrist pain.  Patient noting that she carried a 40 pound bag of cat litter into the house last night shortly before going to sleep.  Patient then woke up this morning with increased pain in left wrist.  Denies any other form of potential trauma.  Patient has not taken any OTC medicines for her pain today.  Patient denies any other acute/infectious symptoms.    Wrist Pain       Prior to Admission medications   Medication Sig Start Date End Date Taking? Authorizing Provider  albuterol  (PROVENTIL  HFA;VENTOLIN  HFA) 108 (90 BASE) MCG/ACT inhaler Inhale 2 puffs into the lungs every 6 (six) hours as needed for wheezing or shortness of breath.     [provider]  Alirocumab  (PRALUENT ) 150 MG/ML SOAJ Inject 1 mL (150 mg total) into the skin every 14 (fourteen) days. 07/10/23   Jerilynn Lamarr HERO, NP  amLODipine  (NORVASC ) 5 MG tablet Take 1 tablet (5 mg total) by mouth daily. 01/24/24   Court Dorn PARAS, MD  aspirin  EC 81 MG tablet Take 1 tablet (81 mg total) by mouth daily. Swallow whole. 01/08/24 01/07/25  Juvenal Harlene PENNER, DO  atorvastatin  (LIPITOR ) 80 MG tablet TAKE 1 TABLET BY MOUTH EVERY DAY 05/02/24   Court Dorn PARAS, MD  brimonidine  (ALPHAGAN ) 0.15 % ophthalmic solution Place 1 drop into both eyes 2 (two) times daily.     [provider]  Budeson-Glycopyrrol-Formoterol  (BREZTRI  AEROSPHERE) 160-9-4.8 MCG/ACT AERO Inhale 2 puffs into the lungs in the morning and at bedtime. 04/30/23   Cobb, Comer GAILS, NP  clopidogrel  (PLAVIX ) 75 MG tablet Take 1 tablet (75 mg total) by mouth daily. 04/07/24 04/07/25  Rana Lum CROME, NP  escitalopram  (LEXAPRO ) 20 MG tablet Take 20 mg by mouth daily. 05/31/19    [provider]  ferrous sulfate  325 (65 FE) MG EC tablet Take 325 mg by mouth daily with breakfast. 03/11/24   [provider]  fluticasone  (FLONASE ) 50 MCG/ACT nasal spray Place 2 sprays into both nostrils daily. 05/30/23   Soldatova, [place], MD  isosorbide  mononitrate (IMDUR ) 120 MG 24 hr tablet Take 1 tablet (120 mg total) by mouth daily. 03/20/24   Rana Lum CROME, NP  lamoTRIgine  (LAMICTAL ) 100 MG tablet Take 100 mg by mouth daily. 08/05/19   [provider]  metoprolol  tartrate (LOPRESSOR ) 25 MG tablet Take 0.5 tablets (12.5 mg total) by mouth 2 (two) times daily. 01/24/24   Court Dorn PARAS, MD  Na Sulfate-K Sulfate-Mg Sulfate concentrate (SUPREP) 17.5-3.13-1.6 GM/177ML SOLN SMARTSIG:1 Kit(s) By Mouth Once Patient not taking: Reported on 06/25/2024 05/01/24   [provider]  niacin  (NIASPAN ) 1000 MG CR tablet TAKE 1 TABLET (1,000 MG TOTAL) BY MOUTH AT BEDTIME. 03/17/24   Court Dorn PARAS, MD  nitroGLYCERIN  (NITROSTAT ) 0.4 MG SL tablet Place 1 tablet (0.4 mg total) under the tongue every 5 (five) minutes as needed for chest pain. 01/24/24   Court Dorn PARAS, MD  ondansetron  (ZOFRAN -ODT) 4 MG disintegrating tablet Take 1 tablet (4 mg total) by mouth every 8 (eight) hours as needed for nausea or vomiting. 01/20/24   Garrick [place], MD  pantoprazole  (PROTONIX ) 40 MG tablet TAKE 1 TABLET BY  MOUTH TWICE A DAY 05/26/24   [place], PA-C  QUEtiapine  Fumarate 150 MG TABS Take 150 mg by mouth at bedtime.    [provider]  ranolazine  (RANEXA ) 1000 MG SR tablet Take 1 tablet (1,000 mg total) by mouth 2 (two) times daily. 03/20/24   Rana Lum CROME, NP  sucralfate  (CARAFATE ) 1 g tablet Take 1 tablet (1 g total) by mouth 4 (four) times daily -  with meals and at bedtime. Carafate  1 g 4 times daily x 2 weeks and  then twice daily for 1 month. Then may stop. 02/27/24 06/25/24  [place], PA-C  tamsulosin (FLOMAX) 0.4 MG CAPS capsule Take 1 capsule (0.4 mg  total) by mouth daily for 10 days. 06/25/24 07/05/24  Andra Corean BROCKS, PA-C  traMADol  (ULTRAM ) 50 MG tablet Take 1 tablet (50 mg total) by mouth every 6 (six) hours as needed. 06/25/24   Andra Corean BROCKS, PA-C  VASCEPA  1 g capsule TAKE 2 CAPSULES BY MOUTH 2 TIMES DAILY. 05/16/24   Court Dorn PARAS, MD  Vitamin D , Ergocalciferol , (DRISDOL) 1.25 MG (50000 UNIT) CAPS capsule Take 1 capsule (50,000 Units total) by mouth every 7 (seven) days. 05/30/24 05/30/25  [place], PA-C  VITAMIN E PO Take 1 tablet by mouth daily.    [provider]    Allergies: Benadryl  [diphenhydramine  hcl], Codeine, Haldol [haloperidol decanoate], Amoxicillin, and Bee venom    Review of Systems  Musculoskeletal:        Wrist pain    Updated Vital Signs BP (!) 144/98 (BP Location: Right Arm)   Pulse 66   Temp 98.3 F (36.8 C) (Oral)   Resp 17   Ht 5' 5 (1.651 m)   Wt 82.1 kg   LMP 11/16/2011   SpO2 98%   BMI 30.12 kg/m   Physical Exam Vitals and nursing note reviewed.  Constitutional:      General: She is not in acute distress.    Appearance: She is not ill-appearing or toxic-appearing.  HENT:     Head: Normocephalic and atraumatic.  Eyes:     General: No scleral icterus.       Right eye: No discharge.        Left eye: No discharge.     Conjunctiva/sclera: Conjunctivae normal.  Cardiovascular:     Rate and Rhythm: Normal rate.  Pulmonary:     Effort: Pulmonary effort is normal.  Abdominal:     General: Abdomen is flat.  Musculoskeletal:     Comments: Left wrist: mild swelling and tenderness to palpation over volar wrist area. Active ROM mildly to moderately restricted d/t pain. +2 radial pulse.  Sensation to light touch intact.  Brisk capillary refill.  Area nontense.  No erythema, fluctuance, or increased warmth of the wrist joint.  Skin:    General: Skin is warm and dry.  Neurological:     General: No focal deficit present.     Mental Status: She is alert. Mental status  is at baseline.  Psychiatric:        Mood and Affect: Mood normal.        Behavior: Behavior normal.     (all labs ordered are listed, but only abnormal results are displayed) Labs Reviewed - No data to display  EKG: None  Radiology: DG Hand Complete Left Result Date: 06/27/2024 EXAM: 3 OR MORE VIEW(S) XRAY OF THE LEFT HAND 06/27/2024 07:38:00 PM COMPARISON: None available. CLINICAL HISTORY: swelling FINDINGS: BONES AND JOINTS: Mild degenerative changes of  hand and wrist. Calcification of triangular fibrocartilage complex. No acute fracture. No focal osseous lesion. No joint dislocation. SOFT TISSUES: Mild soft tissue swelling at the wrist. IMPRESSION: 1. Mild soft tissue swelling at the wrist. No acute fracture. Electronically signed by: Norman Gatlin MD 06/27/2024 08:07 PM EST RP Workstation: [id number]   DG Wrist Complete Left Result Date: 06/27/2024 EXAM: 3 OR MORE VIEW(S) XRAY OF THE LEFT WRIST 06/27/2024 07:38:00 PM COMPARISON: None available. CLINICAL HISTORY: swelling FINDINGS: BONES AND JOINTS: No acute fracture. No focal osseous lesion. No joint dislocation. Mild degenerative changes. Triangular fibrocartilage chondrocalcinosis. SOFT TISSUES: Soft tissue swelling. IMPRESSION: 1. Soft tissue swelling. 2. No acute fracture. Electronically signed by: Norman Gatlin MD 06/27/2024 08:06 PM EST RP Workstation: [id number]     Procedures   Medications Ordered in the ED  acetaminophen  (TYLENOL ) tablet 650 mg (has no administration in time range)                                    Medical Decision Making Amount and/or Complexity of Data Reviewed Radiology: ordered.  Risk OTC drugs.   This patient presents to the ED for concern of wrist pain, this involves an extensive number of treatment options, and is a complaint that carries with it a high risk of complications and morbidity.  The differential diagnosis includes hemarthrosis, gout, septic joint, fracture, tendonitis, carpal  tunnel syndrome, muscle strain, bursitis, compartment syndrome   Co morbidities that complicate the patient evaluation  anemia, CHF, COPD, CAD, GERD, HLD, HTN   Additional history obtained:  Dr. Rena PCP   Problem List / ED Course / Critical interventions / Medication management  Patient presents to ED concern for left wrist pain.  Physical exam with mild to moderately restricted active ROM.  There is also mild swelling without erythema, fluctuance, or increased warmth.  Rest of physical exam reassuring.  Patient afebrile with stable vitals. I ordered imaging studies including left hand/wrist x-ray. I independently visualized and interpreted imaging. I agree with the radiologist interpretation of mild soft tissue swelling without acute osseous process.  Shared results with patient.  Answered all questions.  It appears the patient sustained a wrist sprain while carrying the 40 pounds of cat litter inside last night.  Will provide patient with wrist brace.  Educated patient on RICE therapy.  Provided patient with orthopedic follow-up information.  Patient agreeable with plan. I have reviewed the patients home medicines and have made adjustments as needed The patient has been appropriately medically screened and/or stabilized in the ED. I have low suspicion for any other emergent medical condition which would require further screening, evaluation or treatment in the ED or require inpatient management. At time of discharge the patient is hemodynamically stable and in no acute distress. I have discussed work-up results and diagnosis with patient and answered all questions. Patient is agreeable with discharge plan. We discussed strict return precautions for returning to the emergency department and they verbalized understanding.     Ddx these are considered less likely due to history of present illness and physical exam -hemarthrosis: joint without swelling; ROM intact -gout: no warmth or  erythema; ROM intact  -septic joint: afebrile; no warmth or erythema; no skin changes; ROM intact  -fracture: xray without concern  -compartment syndrome: area not tense; neurovascularly intact   Social Determinants of Health:  none      Final diagnoses:  Sprain of left wrist,  unspecified location, initial encounter    ED Discharge Orders     None          [name] 06/27/24 2033    Cottie Donnice PARAS, MD 06/27/24 2112

## 2024-06-27 NOTE — Discharge Instructions (Signed)
 Please follow-up with primary care and orthopedics.  Seek emergency care if experiencing any new or worsening symptoms. Please use ice, elevation, and Tylenol  to help relieve the inflammation.

## 2024-06-27 NOTE — Progress Notes (Signed)
 Orthopedic Tech Progress Note Patient Details:  [name] 1961-07-23 [id number]  Ortho Devices Type of Ortho Device: Velcro wrist forearm splint Ortho Device/Splint Location: LUE Ortho Device/Splint Interventions: Application   Post Interventions Patient Tolerated: Well  [name] 06/27/2024, 9:07 PM

## 2024-06-30 NOTE — Telephone Encounter (Signed)
 Can someone help place orders for IV iron, I have not done that before.  Thanks

## 2024-06-30 NOTE — Telephone Encounter (Signed)
 Can you please place orders for IV iron per iron infusion work flow. I will place a reminder for patient to have repeat labs in 2 months. Thanks

## 2024-06-30 NOTE — Telephone Encounter (Signed)
 Called patient and informed her of capsule endoscopy results. Also informed patient that Dr. Leigh would like to know if she has started on IV iron. Patient reports she has not started on IV iron yet.

## 2024-06-30 NOTE — Telephone Encounter (Signed)
 Okay thanks for the update. Can we refer her to [facility] for IV Iron if she has not already been referred? Otherwise can you please place a recall to repeat CBC and TIBC / ferritin panel in 2 months. Thanks

## 2024-07-01 ENCOUNTER — Telehealth: Payer: Self-pay | Admitting: Pharmacy Technician

## 2024-07-01 ENCOUNTER — Encounter: Payer: Self-pay | Admitting: Gastroenterology

## 2024-07-01 DIAGNOSIS — D509 Iron deficiency anemia, unspecified: Secondary | ICD-10-CM | POA: Insufficient documentation

## 2024-07-01 NOTE — Telephone Encounter (Signed)
 Orders placed for IV fereheme at [facility] infusion center.

## 2024-07-01 NOTE — Telephone Encounter (Signed)
 Auth Submission: NO AUTH NEEDED Site of care: Site of care: [facility] Payer: MEDICARE A/B Medication & CPT/J Code(s) submitted: Feraheme (ferumoxytol) [id number] Diagnosis Code: D50.9 Route of submission (phone, fax, portal):  Phone # Fax # Auth type: Buy/Bill PB Units/visits requested: X2 Reference number:  Approval from: 07/01/24 to 09/20/24

## 2024-07-02 ENCOUNTER — Encounter: Payer: Self-pay | Admitting: Oncology

## 2024-07-02 ENCOUNTER — Telehealth: Payer: Self-pay

## 2024-07-02 ENCOUNTER — Other Ambulatory Visit: Payer: Self-pay | Admitting: Oncology

## 2024-07-02 ENCOUNTER — Inpatient Hospital Stay: Attending: Oncology | Admitting: Oncology

## 2024-07-02 ENCOUNTER — Inpatient Hospital Stay

## 2024-07-02 VITALS — BP 145/87 | HR 52 | Temp 97.9°F | Resp 16 | Ht 65.0 in | Wt 182.0 lb

## 2024-07-02 DIAGNOSIS — F17210 Nicotine dependence, cigarettes, uncomplicated: Secondary | ICD-10-CM | POA: Diagnosis not present

## 2024-07-02 DIAGNOSIS — Z803 Family history of malignant neoplasm of breast: Secondary | ICD-10-CM | POA: Diagnosis not present

## 2024-07-02 DIAGNOSIS — I10 Essential (primary) hypertension: Secondary | ICD-10-CM | POA: Diagnosis not present

## 2024-07-02 DIAGNOSIS — M810 Age-related osteoporosis without current pathological fracture: Secondary | ICD-10-CM | POA: Diagnosis not present

## 2024-07-02 DIAGNOSIS — Z801 Family history of malignant neoplasm of trachea, bronchus and lung: Secondary | ICD-10-CM | POA: Insufficient documentation

## 2024-07-02 DIAGNOSIS — J449 Chronic obstructive pulmonary disease, unspecified: Secondary | ICD-10-CM | POA: Diagnosis not present

## 2024-07-02 DIAGNOSIS — I2510 Atherosclerotic heart disease of native coronary artery without angina pectoris: Secondary | ICD-10-CM | POA: Insufficient documentation

## 2024-07-02 DIAGNOSIS — D509 Iron deficiency anemia, unspecified: Secondary | ICD-10-CM | POA: Diagnosis not present

## 2024-07-02 LAB — CBC WITH DIFFERENTIAL (CANCER CENTER ONLY)
Abs Immature Granulocytes: 0.04 K/uL (ref 0.00–0.07)
Basophils Absolute: 0.1 K/uL (ref 0.0–0.1)
Basophils Relative: 1 %
Eosinophils Absolute: 0.2 K/uL (ref 0.0–0.5)
Eosinophils Relative: 3 %
HCT: 34.3 % — ABNORMAL LOW (ref 36.0–46.0)
Hemoglobin: 10.9 g/dL — ABNORMAL LOW (ref 12.0–15.0)
Immature Granulocytes: 0 %
Lymphocytes Relative: 28 %
Lymphs Abs: 2.7 K/uL (ref 0.7–4.0)
MCH: 25.5 pg — ABNORMAL LOW (ref 26.0–34.0)
MCHC: 31.8 g/dL (ref 30.0–36.0)
MCV: 80.1 fL (ref 80.0–100.0)
Monocytes Absolute: 0.7 K/uL (ref 0.1–1.0)
Monocytes Relative: 8 %
Neutro Abs: 5.8 K/uL (ref 1.7–7.7)
Neutrophils Relative %: 60 %
Platelet Count: 261 K/uL (ref 150–400)
RBC: 4.28 MIL/uL (ref 3.87–5.11)
RDW: 22.6 % — ABNORMAL HIGH (ref 11.5–15.5)
WBC Count: 9.5 K/uL (ref 4.0–10.5)
nRBC: 0 % (ref 0.0–0.2)

## 2024-07-02 LAB — IRON AND IRON BINDING CAPACITY (CC-WL,HP ONLY)
Iron: 16 ug/dL — ABNORMAL LOW (ref 28–170)
Saturation Ratios: 3 % — ABNORMAL LOW (ref 10.4–31.8)
TIBC: 497 ug/dL — ABNORMAL HIGH (ref 250–450)
UIBC: 481 ug/dL — ABNORMAL HIGH (ref 148–442)

## 2024-07-02 LAB — CMP (CANCER CENTER ONLY)
ALT: 11 U/L (ref 0–44)
AST: 16 U/L (ref 15–41)
Albumin: 3.9 g/dL (ref 3.5–5.0)
Alkaline Phosphatase: 134 U/L — ABNORMAL HIGH (ref 38–126)
Anion gap: 7 (ref 5–15)
BUN: 11 mg/dL (ref 8–23)
CO2: 25 mmol/L (ref 22–32)
Calcium: 9.1 mg/dL (ref 8.9–10.3)
Chloride: 106 mmol/L (ref 98–111)
Creatinine: 0.94 mg/dL (ref 0.44–1.00)
GFR, Estimated: >60 mL/min (ref >60)
Glucose, Bld: 105 mg/dL — ABNORMAL HIGH (ref 70–99)
Potassium: 4 mmol/L (ref 3.5–5.1)
Sodium: 138 mmol/L (ref 135–145)
Total Bilirubin: 0.4 mg/dL (ref 0.0–1.2)
Total Protein: 7.1 g/dL (ref 6.5–8.1)

## 2024-07-02 LAB — VITAMIN B12: Vitamin B-12: 742 pg/mL (ref 180–914)

## 2024-07-02 LAB — FOLATE: Folate: 8 ng/mL (ref >5.9)

## 2024-07-02 LAB — FERRITIN: Ferritin: 17 ng/mL (ref 11–307)

## 2024-07-02 NOTE — Telephone Encounter (Signed)
 Dr. Pasam, patient will be scheduled as soon as possible.  Auth Submission: NO AUTH NEEDED Site of care: Site of care: [facility] Payer: Medicare A/B with [place] medicaid Medication & CPT/J Code(s) submitted: Feraheme (ferumoxytol) [id number] Diagnosis Code:  Route of submission (phone, fax, portal):  Phone # Fax # Auth type: Buy/Bill PB Units/visits requested: 510mg  x 2 doses Reference number:  Approval from: 07/02/24 to 08/20/24

## 2024-07-02 NOTE — Telephone Encounter (Signed)
 Noted

## 2024-07-02 NOTE — Telephone Encounter (Signed)
 Thank you both!

## 2024-07-02 NOTE — Progress Notes (Signed)
 [facility]  HEMATOLOGY CLINIC CONSULTATION NOTE   PATIENT NAME: [name]   MR#: [id number] DOB: [birth year]  DATE OF SERVICE: 07/02/2024  Patient Care Team: Rena Luke POUR, MD as PCP - General (Family Medicine) Court Dorn PARAS, MD as PCP - Cardiology (Cardiology) [place], PA-C as Physician Assistant (Physician Assistant)  REASON FOR CONSULTATION/ CHIEF COMPLAINT:  Evaluation of anemia.  ASSESSMENT & PLAN:   [name] is a 63 y.o. lady with a past medical history of CAD s/p 3V-CABG in November 2008, bipolar disorder, CHF, COPD, depression, GERD, hiatal hernia, dyslipidemia, hypertension, osteoporosis, panic attacks, sleep apnea was referred to our service for evaluation of iron deficiency anemia.    IDA (iron deficiency anemia) Chronic iron deficiency anemia for approximately three years with hemoglobin levels as low as 7.9 g/dL in May 7974.   October 2025, iron saturation is significantly low at 4%, with normal being 20%. No overt bleeding source identified despite endoscopic evaluation. Possible microscopic blood loss considered. Symptoms include fatigue and decreased energy levels.   Oral iron supplementation has been used but causes constipation and nausea, leading to intermittent use.  Intravenous iron infusion is preferred due to rapid improvement in iron stores and energy levels.  We got approval to proceed with Feraheme. We will proceed with 2 doses, 1 week apart.  We will arrange iron infusions at the infusion center on [address], starting next week.  - Ordered blood work today to check iron levels, vitamin B12, folic acid, and blood counts.  - Scheduled follow-up appointment in four months to reassess iron levels and symptoms.  She has multiple other medical comorbidities for which she is following up with gastroenterology, cardiology, PCP.  Since the cause of anemia seems to be obvious from iron deficiency, I am not  pursuing extensive workup at this time.  If inadequate response to iron replacement is noted, we will pursue workup to rule out other etiologies.  I reviewed lab results and outside records for this visit and discussed relevant results with the patient. Diagnosis, plan of care and treatment options were also discussed in detail with the patient. Opportunity provided to ask questions and answers provided to her apparent satisfaction. Provided instructions to call our clinic with any problems, questions or concerns prior to return visit. I recommended to continue follow-up with PCP and sub-specialists. She verbalized understanding and agreed with the plan. No barriers to learning was detected.  Kacia Halley, MD [facility] - A DEPT OF [facility] [address] [place] [address] Dept: [phone] Dept Fax: [phone]  07/02/2024 3:03 PM  HISTORY OF PRESENT ILLNESS:  Discussed the use of AI scribe software for clinical note transcription with the patient, who gave verbal consent to proceed.  History of Present Illness [name] is a 63 year old female with chronic anemia who presents for evaluation and management of her condition. She was referred by [place], PA, for evaluation of her chronic anemia.  She has been experiencing chronic anemia for approximately three years, with hemoglobin levels as low as 7.7 and 7.9 in May 2025. Despite extensive workup, including a capsule endoscopy two weeks ago, no source of bleeding has been identified. No visible blood loss, such as blood in stool, epistaxis, or gum bleeding. Significant fatigue impacts her ability to perform daily activities, necessitating hired help for house cleaning. She is currently taking oral iron  supplements once a day, which cause constipation and occasional nausea. She resumed taking them around May or June after a period of discontinuation due to  side effects.  She has a history of heart disease, having undergone a triple bypass surgery in 2008, and has had stents placed. She reports experiencing what she describes as 'heart attacks' almost weekly but does not seek medical attention for these episodes. She is on a heart-healthy diet and takes Tylenol  for pain management.  She has a hiatal hernia, initially described as a condition where food gets stuck, and a history of ulcers that have healed. She is not on any restricted diet beyond heart-healthy guidelines and consumes meat products. Her iron saturation was noted to be 4% in October, significantly lower than the normal 20%.  She has an allergy to Benadryl , which causes 'pimple-like things' to appear. She experienced a severe reaction after taking Benadryl  for a bee sting, which worsened her condition.     MEDICAL HISTORY:  Past Medical History:  Diagnosis Date   Allergy    Anemia    takes iron supplement   Arthritis    knees, shoulders   Asthma    Bipolar 1 disorder (HCC)    Blood transfusion without reported diagnosis    Cataract, immature    CHF (congestive heart failure) (HCC)    Clotting disorder    she denied any specific genetic bleeding/clotting diagnosis (10/05/23)   COPD (chronic obstructive pulmonary disease) (HCC)    Coronary artery disease    triple vessel   Depression    Dyspnea    Family history of adverse reaction to anesthesia    states father did not do well with anesthesia; died last time he had surgery; also had heart disease   Gallstones    GERD (gastroesophageal reflux disease)    occasional - no current med.   Glaucoma    Hammertoe 05/2015   right second toe   Heart murmur    Hepatic steatosis    High cholesterol    History of MI (myocardial infarction)    Hypertension    states under control with meds., has been on med. x 8 yr.   Myocardial infarction (HCC)    Osteoporosis    Panic attacks    Peripheral vascular disease    carotid    Renal cyst, left    Schizophrenia (HCC)    Short of breath on exertion    Sleep apnea    Inspire in future   Urinary, incontinence, stress female    Wears dentures    upper   Wears partial dentures    lower    SURGICAL HISTORY: Past Surgical History:  Procedure Laterality Date   C-SECTIONS X 2  1985, 1988   CARDIAC CATHETERIZATION  12/10/2006; 12/18/2006; 07/10/2007; 07/19/2009   CARDIAC CATHETERIZATION  03/04/2010   patent grafts, normal LV function, diffusely diseased RCA (Dr. DOROTHA Lesches)   CARDIAC CATHETERIZATION N/A 02/15/2016   Procedure: Left Heart Cath and Cors/Grafts Angiography;  Surgeon: Peter M Jordan, MD;  Location: [facility] INVASIVE CV LAB;  Service: Cardiovascular;  Laterality: N/A;   CHOLECYSTECTOMY N/A 06/29/2023   Procedure: LAPAROSCOPIC CHOLECYSTECTOMY WITH ICG DYE;  Surgeon: Tanda Locus, MD;  Location: [facility];  Service: General;  Laterality: N/A;   CLUB FOOT RELEASE Bilateral    1st and 2nd toes   COLONOSCOPY     CORONARY ARTERY BYPASS GRAFT  07/16/2007   LIMA to LAD, VG to ramus, VG to PDA  CORONARY STENT INTERVENTION N/A 06/23/2019   Procedure: CORONARY STENT INTERVENTION;  Surgeon: Court Dorn PARAS, MD;  Location: [facility] INVASIVE CV LAB;  Service: Cardiovascular;  Laterality: N/A;   DRUG INDUCED ENDOSCOPY N/A 05/17/2023   Procedure: DRUG INDUCED ENDOSCOPY;  Surgeon: Jude Harden GAILS, MD;  Location: [facility] ENDOSCOPY;  Service: Pulmonary;  Laterality: N/A;   ESOPHAGOGASTRODUODENOSCOPY N/A 01/07/2024   Procedure: EGD (ESOPHAGOGASTRODUODENOSCOPY);  Surgeon: Wilhelmenia Aloha Raddle., MD;  Location: [facility];  Service: Gastroenterology;  Laterality: N/A;   FLEXIBLE BRONCHOSCOPY  07/16/2007   HAMMERTOE RECONSTRUCTION WITH WEIL OSTEOTOMY Right 06/03/2015   Procedure: RIGHT SECOND HAMMERTOE CORRECTION WITH MT WEIL OSTEOTOMY;  Surgeon: Norleen Armor, MD;  Location: [facility];  Service: Orthopedics;  Laterality: Right;   HAND SURGERY Bilateral    1st and 2nd fingers -  release of clubbing   IMPLANTATION OF HYPOGLOSSAL NERVE STIMULATOR N/A 10/10/2023   Procedure: IMPLANTATION OF HYPOGLOSSAL NERVE STIMULATOR;  Surgeon: Okey Burns, MD;  Location: [facility];  Service: ENT;  Laterality: N/A;  right   KNEE ARTHROSCOPY WITH ANTERIOR CRUCIATE LIGAMENT (ACL) REPAIR Right 01/30/2008   LEFT HEART CATH AND CORS/GRAFTS ANGIOGRAPHY N/A 06/23/2019   Procedure: LEFT HEART CATH AND CORS/GRAFTS ANGIOGRAPHY;  Surgeon: Court Dorn PARAS, MD;  Location: [facility] INVASIVE CV LAB;  Service: Cardiovascular;  Laterality: N/A;   LEFT HEART CATH AND CORS/GRAFTS ANGIOGRAPHY N/A 03/01/2020   Procedure: LEFT HEART CATH AND CORS/GRAFTS ANGIOGRAPHY;  Surgeon: Claudene Victory ORN, MD;  Location: [facility]VASIVE [facility] LAB;  Service: Cardiovascular;  Laterality: N/A;   NOVASURE ABLATION  12/19/2007   with removal of IUD   ORIF FOOT FRACTURE Left    TOE SURGERIES TO REMOVE NAILS DUE TO CLUBBING     TOTAL KNEE ARTHROPLASTY Right 10/21/2012   Procedure: TOTAL KNEE ARTHROPLASTY;  Surgeon: Donnice JONETTA Car, MD;  Location: [facility];  Service: Orthopedics;  Laterality: Right;   UPPER GASTROINTESTINAL ENDOSCOPY      SOCIAL HISTORY: She reports that she has been smoking cigarettes. She started smoking about 38 years ago. She has a 77.7 pack-year smoking history. She has been exposed to tobacco smoke. She has never used smokeless tobacco. She reports that she does not drink alcohol and does not use drugs. Social History   Socioeconomic History   Marital status: Divorced    Spouse name: Not on file   Number of children: Not on file   Years of education: Not on file   Highest education level: Not on file  Occupational History   Not on file  Tobacco Use   Smoking status: Some Days    Current packs/day: 2.00    Average packs/day: 2.0 packs/day for 38.9 years (77.7 ttl pk-yrs)    Types: Cigarettes    Start date: 08/21/1985    Passive exposure: Current   Smokeless tobacco: Never   Tobacco comments:    10/23/2023  patient smoke a pack of cigarettes a month    1 cigarette when she feels like it  Vaping Use   Vaping status: Never Used  Substance and Sexual Activity   Alcohol use: No   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   Sexual activity: Not on file  Other Topics Concern   Not on file  Social History Narrative   Not on file   Social Drivers of Health   Financial Resource Strain: Low Risk  (01/15/2024)   Received from [facility]   Overall Financial Resource Strain (CARDIA)  Difficulty of Paying Living Expenses: Not very hard  Food Insecurity: Food Insecurity Present (01/15/2024)   Received [facility]   Hunger Vital Sign    Within the past 12 months, you worried that your food would run out before you got the money to buy more.: Never true    Within the past 12 months, the food you bought just didn't last and you didn't have money to get more.: Sometimes true  Transportation Needs: Unmet Transportation Needs (01/15/2024)   Received from [facility]   PRAPARE - Transportation    Lack of Transportation (Medical): Yes    Lack of Transportation (Non-Medical): No  Physical Activity: Insufficiently Active (01/15/2024)   Received from [facility]   Exercise Vital Sign    On average, how many days per week do you engage in moderate to strenuous exercise (like a brisk walk)?: 1 day    On average, how many minutes do you engage in exercise at this level?: 30 min  Stress: Stress Concern Present (01/15/2024)   Received from [facility] - Occupational Stress Questionnaire    Feeling of Stress : To some extent  Social Connections: Somewhat Isolated (01/15/2024)   Received from [facility]   Social Network    How would you rate your social network (family, work, friends)?: Restricted participation with some degree of social isolation  Intimate Partner Violence: Not At Risk (01/15/2024)   Received from [facility]   HITS    Over  the last 12 months how often did your partner physically hurt you?: Never    Over the last 12 months how often did your partner insult you or talk down to you?: Never    Over the last 12 months how often did your partner threaten you with physical harm?: Never    Over the last 12 months how often did your partner scream or curse at you?: Never    FAMILY HISTORY: Family History  Problem Relation Age of Onset   Lung cancer Mother    Kidney cancer Mother    Heart disease Father    Anesthesia problems Father        states did not do well with anesthesia   Colon polyps Sister    Breast cancer Maternal Aunt    Breast cancer Maternal Aunt    Stomach cancer Neg Hx    Esophageal cancer Neg Hx    Pancreatic cancer Neg Hx    Rectal cancer Neg Hx    Colon cancer Neg Hx     ALLERGIES:  She is allergic to benadryl  [diphenhydramine  hcl], codeine, haldol [haloperidol decanoate], amoxicillin, and bee venom.  MEDICATIONS:  Current Outpatient Medications  Medication Sig Dispense Refill   albuterol  (PROVENTIL  HFA;VENTOLIN  HFA) 108 (90 BASE) MCG/ACT inhaler Inhale 2 puffs into the lungs every 6 (six) hours as needed for wheezing or shortness of breath.      Alirocumab  (PRALUENT ) 150 MG/ML SOAJ Inject 1 mL (150 mg total) into the skin every 14 (fourteen) days. 6 mL 6   amLODipine  (NORVASC ) 5 MG tablet Take 1 tablet (5 mg total) by mouth daily. 90 tablet 3   aspirin  EC 81 MG tablet Take 1 tablet (81 mg total) by mouth daily. Swallow whole.     atorvastatin  (LIPITOR ) 80 MG tablet TAKE 1 TABLET BY MOUTH EVERY DAY 90 tablet 2   brimonidine  (ALPHAGAN ) 0.15 % ophthalmic solution Place 1 drop into both eyes 2 (two) times daily.  Budeson-Glycopyrrol-Formoterol  (BREZTRI  AEROSPHERE) 160-9-4.8 MCG/ACT AERO Inhale 2 puffs into the lungs in the morning and at bedtime. 10.7 each 5   clopidogrel  (PLAVIX ) 75 MG tablet Take 1 tablet (75 mg total) by mouth daily. 90 tablet 3   escitalopram  (LEXAPRO ) 20 MG tablet  Take 20 mg by mouth daily.     ferrous sulfate  325 (65 FE) MG EC tablet Take 325 mg by mouth daily with breakfast.     fluticasone  (FLONASE ) 50 MCG/ACT nasal spray Place 2 sprays into both nostrils daily. 16 g 6   isosorbide  mononitrate (IMDUR ) 120 MG 24 hr tablet Take 1 tablet (120 mg total) by mouth daily. 90 tablet 1   lamoTRIgine  (LAMICTAL ) 100 MG tablet Take 100 mg by mouth daily.     metoprolol  tartrate (LOPRESSOR ) 25 MG tablet Take 0.5 tablets (12.5 mg total) by mouth 2 (two) times daily. 90 tablet 3   niacin  (NIASPAN ) 1000 MG CR tablet TAKE 1 TABLET (1,000 MG TOTAL) BY MOUTH AT BEDTIME. 90 tablet 3   nitroGLYCERIN  (NITROSTAT ) 0.4 MG SL tablet Place 1 tablet (0.4 mg total) under the tongue every 5 (five) minutes as needed for chest pain. 25 tablet 11   ondansetron  (ZOFRAN -ODT) 4 MG disintegrating tablet Take 1 tablet (4 mg total) by mouth every 8 (eight) hours as needed for nausea or vomiting. 20 tablet 0   pantoprazole  (PROTONIX ) 40 MG tablet TAKE 1 TABLET BY MOUTH TWICE A DAY 180 tablet 1   QUEtiapine  Fumarate 150 MG TABS Take 150 mg by mouth at bedtime.     ranolazine  (RANEXA ) 1000 MG SR tablet Take 1 tablet (1,000 mg total) by mouth 2 (two) times daily. 180 tablet 1   sucralfate  (CARAFATE ) 1 g tablet Take 1 tablet (1 g total) by mouth 4 (four) times daily -  with meals and at bedtime. Carafate  1 g 4 times daily x 2 weeks and  then twice daily for 1 month. Then may stop. 120 tablet 2   tamsulosin (FLOMAX) 0.4 MG CAPS capsule Take 1 capsule (0.4 mg total) by mouth daily for 10 days. 10 capsule 0   traMADol  (ULTRAM ) 50 MG tablet Take 1 tablet (50 mg total) by mouth every 6 (six) hours as needed. 15 tablet 0   VASCEPA  1 g capsule TAKE 2 CAPSULES BY MOUTH 2 TIMES DAILY. 360 capsule 1   Vitamin D , Ergocalciferol , (DRISDOL) 1.25 MG (50000 UNIT) CAPS capsule Take 1 capsule (50,000 Units total) by mouth every 7 (seven) days. 4 capsule 11   VITAMIN E PO Take 1 tablet by mouth daily.     Current  Facility-Administered Medications  Medication Dose Route Frequency Provider Last Rate Last Admin   0.9 %  sodium chloride  infusion  500 mL Intravenous Once Armbruster, Elspeth SQUIBB, MD        REVIEW OF SYSTEMS:    Review of Systems - Oncology  All other pertinent systems were reviewed and were negative except as mentioned above.  PHYSICAL EXAMINATION:    Onc Performance Status - 07/02/24 1415       KPS SCALE   KPS % SCORE Cares for self, unable to carry on normal activity or to do active work          Vitals:   07/02/24 1407  BP: (!) 145/87  Pulse: (!) 52  Resp: 16  Temp: 97.9 F (36.6 C)  SpO2: 100%   Filed Weights   07/02/24 1407  Weight: 182 lb (82.6 kg)    Physical Exam  Constitutional:      General: She is not in acute distress.    Appearance: Normal appearance.  HENT:     Head: Normocephalic and atraumatic.  Cardiovascular:     Rate and Rhythm: Normal rate.  Pulmonary:     Effort: Pulmonary effort is normal. No respiratory distress.  Abdominal:     General: There is no distension.  Neurological:     General: No focal deficit present.     Mental Status: She is alert and oriented to person, place, and time.  Psychiatric:        Mood and Affect: Mood normal.        Behavior: Behavior normal.       LABORATORY DATA:   I have reviewed the data as listed.  No results found for any visits on 07/02/24.   RADIOGRAPHIC STUDIES:  I have personally reviewed the radiological images as listed and agree with the findings in the report.  DG Hand Complete Left Result Date: 06/27/2024 EXAM: 3 OR MORE VIEW(S) XRAY OF THE LEFT HAND 06/27/2024 07:38:00 PM COMPARISON: None available. CLINICAL HISTORY: swelling FINDINGS: BONES AND JOINTS: Mild degenerative changes of hand and wrist. Calcification of triangular fibrocartilage complex. No acute fracture. No focal osseous lesion. No joint dislocation. SOFT TISSUES: Mild soft tissue swelling at the wrist. IMPRESSION: 1.  Mild soft tissue swelling at the wrist. No acute fracture. Electronically signed by: Norman Gatlin MD 06/27/2024 08:07 PM EST RP Workstation: [id number]   DG Wrist Complete Left Result Date: 06/27/2024 EXAM: 3 OR MORE VIEW(S) XRAY OF THE LEFT WRIST 06/27/2024 07:38:00 PM COMPARISON: None available. CLINICAL HISTORY: swelling FINDINGS: BONES AND JOINTS: No acute fracture. No focal osseous lesion. No joint dislocation. Mild degenerative changes. Triangular fibrocartilage chondrocalcinosis. SOFT TISSUES: Soft tissue swelling. IMPRESSION: 1. Soft tissue swelling. 2. No acute fracture. Electronically signed by: Norman Gatlin MD 06/27/2024 08:06 PM EST RP Workstation: [id number]    Orders Placed This Encounter  Procedures   CBC with Differential (Cancer Center Only)    Standing Status:   Future    Number of Occurrences:   1    Expiration Date:   07/02/2025   CMP (Cancer Center only)    Standing Status:   Future    Number of Occurrences:   1    Expiration Date:   07/02/2025   Iron and Iron Binding Capacity ([facility] only)    Standing Status:   Future    Number of Occurrences:   1    Expiration Date:   07/02/2025   Ferritin    Standing Status:   Future    Number of Occurrences:   1    Expiration Date:   07/02/2025   Vitamin B12    Standing Status:   Future    Number of Occurrences:   1    Expiration Date:   07/02/2025   Folate    Standing Status:   Future    Number of Occurrences:   1    Expiration Date:   07/02/2025    Future Appointments  Date Time Provider Department Center  07/08/2024  3:40 PM [facility] MM 2 [facility]  07/11/2024 11:30 AM Malachy Comer GAILS, NP [facility] [address]  08/15/2024  3:20 PM Armbruster, Elspeth SQUIBB, MD [facility]  08/25/2024  8:00 PM Jude Harden GAILS, MD [facility]     I spent a total of 50 minutes during this encounter with the patient including review of chart and various tests results,  discussions about plan of care and  coordination of care plan.  This document was completed utilizing speech recognition software. Grammatical errors, random word insertions, pronoun errors, and incomplete sentences are an occasional consequence of this system due to software limitations, ambient noise, and hardware issues. Any formal questions or concerns about the content, text or information contained within the body of this dictation should be directly addressed to the provider for clarification.

## 2024-07-02 NOTE — Assessment & Plan Note (Signed)
 Chronic iron deficiency anemia for approximately three years with hemoglobin levels as low as 7.9 g/dL in May 7974.   October 2025, iron saturation is significantly low at 4%, with normal being 20%. No overt bleeding source identified despite endoscopic evaluation. Possible microscopic blood loss considered. Symptoms include fatigue and decreased energy levels.   Oral iron supplementation has been used but causes constipation and nausea, leading to intermittent use.  Intravenous iron infusion is preferred due to rapid improvement in iron stores and energy levels.  We got approval to proceed with Feraheme. We will proceed with 2 doses, 1 week apart.  We will arrange iron infusions at the infusion center on [address], starting next week.  - Ordered blood work today to check iron levels, vitamin B12, folic acid, and blood counts.  - Scheduled follow-up appointment in four months to reassess iron levels and symptoms.

## 2024-07-02 NOTE — Telephone Encounter (Signed)
 Rock, We received the orders and patient will be scheduled as soon as possible. Luke

## 2024-07-08 ENCOUNTER — Ambulatory Visit

## 2024-07-11 ENCOUNTER — Encounter: Admitting: Nurse Practitioner

## 2024-07-11 ENCOUNTER — Ambulatory Visit: Payer: Self-pay

## 2024-07-11 NOTE — Telephone Encounter (Signed)
 FYI Only or Action Required?: FYI only for provider: Contacting [employer] Support line.  Patient is followed in Pulmonology for OSA, last seen on 06/13/2024 by Malachy Comer GAILS, NP.  Called Nurse Triage reporting device malfunction.  Symptoms began today.  Interventions attempted: Nothing.  Symptoms are: unchanged.  Triage Disposition: Call PCP When Office is Open  Patient/caregiver understands and will follow disposition?: Yes Copied from CRM [id number]. Topic: Clinical - Red Word Triage >> Jul 11, 2024 12:28 PM Rilla B wrote: [facility] that prompted transfer to Nurse Triage: Patient states inspire device is going off and it feels like tongue is swollen and difficulty talking Reason for Disposition  [1] Caller requesting NON-URGENT health information AND [2] PCP's office is the best resource  Answer Assessment - Initial Assessment Questions 1. REASON FOR CALL: What is the main reason for your call? or How can I best help you?     [employer]spire device malfunction- device turned on by itself.  2. SYMPTOMS : Do you have any symptoms?      Tongue jumping all over, causing slurred speech.  3. OTHER QUESTIONS: Do you have any other questions?     Contacted the CMAs in office and recommended to contact the [facility] tech support line- pt given [phone].  Patient had FU today but missed it due to her mother passing and having to complete funeral arrangements for tomorrow.  Denies any other stroke type symptoms, CP, or SOB. She can feel the shocks.  Understands ED if symptoms continue after device turned off.  Protocols used: Information Only Call - No Triage-A-AH

## 2024-07-11 NOTE — Telephone Encounter (Signed)
 I called and spoke to pt. Pt states she got in contact with [employer] and pt states everything is fine now. Pt states she had only left it on. NFN

## 2024-07-11 NOTE — Patient Instructions (Signed)
 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  Shee denies any discomfort.  Device was set to conventional configuration+/-/+ Programming :  1.  Stimulation level : Sensation 1.4 V , functional level 1.4 V lower limit, upper limit 2.4 V.  He was set at level 1 2.  Start delay 60 minutes, pause time 15 minutes, duration 6 hours 3.  Sensing waveform was analyzed for 3 minutes 4.  Sleep remote education was provided patient demonstrated competency with the remote and was aware of patient Instruction videos and sleep remote guide 5.  Step up levels by 1 level (0.1 V ) every week. You will increase next Friday, 5/23 to level 2 and then every Friday by 1 level after this. If you have any discomfort, you will back down by 1 level, stay there for a week then try to increase again. Do not increase faster than once a week  6.  Check-in visit in presleep study in 4-6 weeks to ensure that they are stepping up levels, using therapy " all night, every night" and to evaluate subjective benefit.   7.  Inspire titration sleep study will be scheduled 2 to 4 weeks after the prestudy visit to verify efficacy of device  Do your neck exercises   Follow up 6/24 at 1130 with Angela Cythia Bachtel,NP. If symptoms do not improve or worsen, please contact office for sooner follow up

## 2024-07-15 ENCOUNTER — Ambulatory Visit (INDEPENDENT_AMBULATORY_CARE_PROVIDER_SITE_OTHER)

## 2024-07-15 VITALS — BP 160/82 | HR 57 | Temp 98.2°F | Resp 18 | Ht 65.0 in | Wt 178.4 lb

## 2024-07-15 DIAGNOSIS — D509 Iron deficiency anemia, unspecified: Secondary | ICD-10-CM | POA: Diagnosis not present

## 2024-07-15 MED ORDER — ACETAMINOPHEN 325 MG PO TABS
650.0000 mg | ORAL_TABLET | Freq: Once | ORAL | Status: AC
  Administered 2024-07-15: 650 mg via ORAL
  Filled 2024-07-15: qty 2

## 2024-07-15 MED ORDER — LORATADINE 10 MG PO TABS
10.0000 mg | ORAL_TABLET | Freq: Once | ORAL | Status: AC
  Administered 2024-07-15: 10 mg via ORAL
  Filled 2024-07-15: qty 1

## 2024-07-15 MED ORDER — CETIRIZINE HCL 10 MG PO TABS
10.0000 mg | ORAL_TABLET | Freq: Every day | ORAL | Status: DC
  Filled 2024-07-15: qty 1

## 2024-07-15 MED ORDER — SODIUM CHLORIDE 0.9 % IV SOLN
510.0000 mg | Freq: Once | INTRAVENOUS | Status: AC
  Administered 2024-07-15: 510 mg via INTRAVENOUS
  Filled 2024-07-15: qty 17

## 2024-07-15 NOTE — Progress Notes (Signed)
 Diagnosis:  Iron Deficiency Anemia  Provider:  Praveen Mannam MD  Procedure: IV Infusion  IV Type: Peripheral, IV Location: R wrist   Feraheme  (Ferumoxytol ), Dose: 510 mg  Infusion Start Time: 1333  Infusion Stop Time: 1350  Post Infusion IV Care: Observation period completed  Discharge: Condition: Good, Destination: Home . AVS Declined  Performed by:  Eleanor DELENA Bloch, RN

## 2024-07-22 ENCOUNTER — Ambulatory Visit (INDEPENDENT_AMBULATORY_CARE_PROVIDER_SITE_OTHER)

## 2024-07-22 VITALS — BP 139/83 | HR 55 | Temp 98.0°F | Resp 18 | Ht 65.0 in | Wt 177.0 lb

## 2024-07-22 DIAGNOSIS — D509 Iron deficiency anemia, unspecified: Secondary | ICD-10-CM

## 2024-07-22 MED ORDER — ACETAMINOPHEN 325 MG PO TABS
650.0000 mg | ORAL_TABLET | Freq: Once | ORAL | Status: AC
  Administered 2024-07-22: 650 mg via ORAL
  Filled 2024-07-22: qty 2

## 2024-07-22 MED ORDER — LORATADINE 10 MG PO TABS
10.0000 mg | ORAL_TABLET | Freq: Once | ORAL | Status: AC
  Administered 2024-07-22: 10 mg via ORAL
  Filled 2024-07-22: qty 1

## 2024-07-22 MED ORDER — SODIUM CHLORIDE 0.9 % IV SOLN
510.0000 mg | Freq: Once | INTRAVENOUS | Status: AC
  Administered 2024-07-22: 510 mg via INTRAVENOUS
  Filled 2024-07-22: qty 17

## 2024-07-22 NOTE — Progress Notes (Signed)
 Diagnosis: Iron Deficiency Anemia  Provider:  Praveen Mannam MD  Procedure: IV Infusion  IV Type: Peripheral, IV Location: R Forearm  Feraheme  (Ferumoxytol ), Dose: 510 mg  Infusion Start Time: 1333  Infusion Stop Time: 1347  Post Infusion IV Care: Observation period completed and Peripheral IV Discontinued  Discharge: Condition: Good, Destination: Home . AVS Declined  Performed by:  Jaidyn Usery, RN

## 2024-07-25 NOTE — Progress Notes (Signed)
 This encounter was created in error - please disregard.

## 2024-07-29 ENCOUNTER — Encounter: Payer: Self-pay | Admitting: Emergency Medicine

## 2024-07-29 ENCOUNTER — Encounter: Payer: Self-pay | Admitting: Nurse Practitioner

## 2024-07-29 ENCOUNTER — Ambulatory Visit: Admitting: Nurse Practitioner

## 2024-07-29 VITALS — BP 122/66 | HR 65 | Ht 65.0 in | Wt 182.8 lb

## 2024-07-29 DIAGNOSIS — Z4542 Encounter for adjustment and management of neuropacemaker (brain) (peripheral nerve) (spinal cord): Secondary | ICD-10-CM | POA: Diagnosis not present

## 2024-07-29 DIAGNOSIS — Z23 Encounter for immunization: Secondary | ICD-10-CM | POA: Diagnosis not present

## 2024-07-29 DIAGNOSIS — G4733 Obstructive sleep apnea (adult) (pediatric): Secondary | ICD-10-CM

## 2024-07-29 DIAGNOSIS — F17210 Nicotine dependence, cigarettes, uncomplicated: Secondary | ICD-10-CM

## 2024-07-29 DIAGNOSIS — J439 Emphysema, unspecified: Secondary | ICD-10-CM

## 2024-07-29 NOTE — Assessment & Plan Note (Signed)
 Stable without any recent exacerbations.  Continue triple therapy regimen.  Action plan in place.

## 2024-07-29 NOTE — Assessment & Plan Note (Signed)
 Severe OSA, intolerant of CPAP.  Inspire implanted 10/10/2023.  Delayed activation due to ongoing nerve healing; activation date 01/04/2024.  Tolerating well. She is receiving benefit from use. Residual fatigue is multifactorial. Prior decreased hours of use and failure to step up due to life stressors/family illness. Improved usage over the last few weeks. Encouraged to continue utilizing nightly with minimum goal of 4 hr.  Tongue motion today was very strong. She is having some sore throat in the mornings. Stepped back down to level 7 with comfortable tongue protrusion. She was having subjective benefit on this levels previously. Advised to remain here until her sleep study. Verbalized understanding. Encouraged to continue management of bipolar as per psychiatry. Understands remote use.  Understands to utilize nightly or anytime that she is napping.  Aware of risks of untreated sleep apnea.  Safe driving practices reviewed.   Patient Instructions  Stay on level 7 until your slep study. Use your Inspire every night, minimum of 4 hours but goal of 6 hours or more.    Attend your titration overnight study on 1/5  Continue Albuterol  inhaler 2 puffs every 6 hours as needed for shortness of breath or wheezing. Notify if symptoms persist despite rescue inhaler/neb use.  Continue Breztri  2 puffs Twice daily. Brush tongue and rinse mouth afterwards  Referral to lung cancer screening - due June 2026    Follow up 1/20 at 130 with Dr. Neda as previously scheduled. If symptoms do not improve or worsen, please contact office for sooner follow up

## 2024-07-29 NOTE — Addendum Note (Signed)
 Addended by: ROLANDA POWELL SAILOR on: 07/29/2024 01:00 PM   Modules accepted: Orders

## 2024-07-29 NOTE — Progress Notes (Signed)
 @Patient  ID: [name], female    DOB: 1961/04/19, 63 y.o.   MRN: [id number]  Chief Complaint  Patient presents with   Sleep Apnea    Inspire    Referring provider: Rena Luke POUR, MD  HPI: 63 year old female, active smoker followed for COPD with chronic bronchitis and emphysema, and severe OSA treated with Inspire. She was last seen 06/13/2024. Past medical history significant for CAD s/p CABG, HTN, hx of NSTEMI, HLD, bipolar disorder, severe OSA, GERD.  TEST/EVENTS:  08/25/2007 PSG: AHI 20.6/h >> optimal CPAP pressure 7 cmH2O 10/14/2022 HST: AHI 33.2/h, SpO2 low 80% 06/11/2023 PFT: FVC 87, FEV1 71, ratio 69, TLC 102, DLCOcor 97. Moderate obstruction with reversibility   03/22/2022: OV with Dr. McQuaid. Coughing up white mucus for years; no change. Recently had a flare up after respiratory infection following exposure to sick kids. Treated with medications but can't remember the names. DOE most of her adult life. Daytime fatigue; snores a lot. Advanced cardiac disease, CAD. Previous CABG. Spirometry showed moderate airflow obstruction. Still smoking, likely contributing to symptoms. Noncompliant with Breo. Will switch to Anoro. Smoking cessation advised. Had CT outside - will request records. Split night sleep study ordered. Follow up 1 week.  05/02/2023: OV with Vyncent Overby NP for overdue follow up. Her breathing has been about the same since she was here last. No worse but no better. Not currently on any scheduled inhalers. She wasn't sure the Anoro really helped her. Doesn't remember much about her response to Breo. She does use her albuterol  daily. No recent exacerbations requiring steroids/abx. She has a daily cough with white phlegm, which is unchanged. Notices an occasional wheeze. She does struggle with reflux. She has not been on anything recently. She has this regardless of what she eats but definitely avoids certain trigger foods. Denies fevers, chills, hemoptysis, leg swelling,  orthopnea, anorexia, weight loss. No difficulties swallowing, abd pain, changes in bowel habits, hoarse voice. She is seeing an ENT for chronic sinusitis symptoms. No worse than normal.  She has had a repeat sleep study since she was here last. She has severe sleep apnea with AHI 33.2/h from [facility] 10/14/2022. She has significant daytime fatigue and restless sleep at night. Never feels rested. She was originally diagnosed with OSA in 2009. She was on CPAP but could not tolerate it even after a CPAP desensitization study. She tried multiple masks and setting adjustments. She has PTSD and is just unable to wear something on her face at night. She feels like her symptoms have gotten worse over the past few years. She is interested in [place] device.   06/19/2023: OV with Dr. Neda.  Recently started on Breztri .  Symptoms are much better with Breztri  use.  Has not used albuterol  in the last month.  Very active.  Down to smoking about a pack a month.  Has a history of OSA, awaiting inspire device implantation.  Overall doing well.  01/04/2024: OV with Hezikiah Retzloff,NP Patient presents today for inspire activation.  She had come in in March, 6 weeks postop, but implantation was delayed due to ongoing nerve healing.  Her inspire was implanted 10/10/2023 by Dr. Soldatova.  Since the end of March, she has had significant improvement.  Not having any more pain or tenderness to the right side.  Still does have some tightness when she moves her neck a certain way or stretches her tongue up to her nose but no discomfort.  Swallowing and speech are normal.  Incision  sites have healed well. She continues to have ongoing issues with her sleep.  She has never felt rested.  Has significant daytime fatigue and restless sleep.  Has been told that she snores in the past.  Denies any drowsy driving or morning headaches.  No sleep parasomnia/paralysis. Breathing stable.  Still using Breztri .  No increased cough or wheezing. Ginny with Inspire  present  05/02/2024: OV with Ralf Konopka NP Patient presents today for inspire titration follow up. She was supposed to be seen last month but when she came to her visit, she had not increased her levels on her Inspire at all so her appt was rescheduled. She has since been stepping up her therapy by 1 level each week, although she skipped last week as she had increased too quickly prior to this. She's currently on 1.7 V, level 4. No discomfort or issues with therapy. Her sleep is sometimes fragmented, due to insomnia and bioplar/mania symptoms; however, when she does sleep, it's much more restful with her Elizabeth and she feels like she's seeing an overall benefit in her energy levels with it as well. She has slurred speech and feels her tongue is a little swollen when she wakes up in the mornings, but this is when the device is still on and as soon as she turns it off, symptoms resolve. No residual swelling. No neck discomfort or changes in speech. No drowsy driving. She has no concerns or complaints today. Her breathing is stable. Still using Breztri . No increased cough or wheezing. No exacerbations. 04/02/2024-05/01/2024: Inspire Download Electrode A, 90/33 Delay 60 min, pause 15 min, duration 6 hr  1.4-2.4 V, current 1.7 V (Level 4) Avg use 6 hr 28 min, some nights as long as 8.5h 28/30 days; 73% >4 hr Average pause 0.3/night   06/13/2024: OV with Saanvi Hakala NP. [name] is a 63 year old female who presents for follow-up regarding her Inspire device usage. She uses her Inspire device most nights but only about fifty percent of the nights for more than four hours. Some nights of non-use are due to her mother's recent hospitalization in intensive care, which resulted in a lack of sleep. She feels comfortable with the device and doesn't have any issues with it waking her up or feeling too strong. She is currently using the device at level seven and has been on this for the last few weeks. She doesn't have  any discomfort but with everything else going on, forgot to increase her level.  She has a history of severe sleep problems, which she attributes to her bipolar disorder causing hyperactivity at night. She experiences persistent fatigue, which she attributes to anemia, stating she is 'extremely tired all the time' and is awaiting a blood transfusion. Despite these issues, she feels 'absolutely' better since starting the Inspire device. She is not aware of any snoring. Does feel her sleep is more restful. No drowsy driving.  0/74/7974-89/76/7974: Inspire Download Electrode A, 90/33 Delay 60 min, pause 15 min, duration 9 hr  1.4-2.4 V, current 2 V (Level 7) 25/30 days (83%); 50% >4 hr; average use 4 hr 31 min Avg pause 0.4  07/29/2024: Today - follow up Discussed the use of AI scribe software for clinical note transcription with the patient, who gave verbal consent to proceed.  History of Present Illness JASMAINE ROCHEL is a 63 year old female with obstructive sleep apnea who presents for follow up.   She experiences persistent fatigue related to anemia and low iron  levels. She has been receiving iron infusions for the past two to three weeks, which have helped.   She feels Elizabeth has significantly improved her sleep. She has increased her usage recently. She previously had a drop off due to caring for her terminally ill mother, who has unfortunately passed away. She's been able to get back to a more consistent regimen with using it. She is on level 10. Does feel a little strong at times and occasionally wakes with a sore throat. She occasionally forgets to use her Inspire device. She denies any drowsy driving. She is pleased with her Inspire. She does have chronic insomnia and typically goes to bed around 3-4 AM, which has not changed with Inspire use. However, her sleep stretches are longer and she doesn't tend to wake up very much when using the Inspire.   Inspire delay, pause and duration  feel appropriate. Wakes in the morning with the device still running.   She recounts an incident where she accidentally left the device on during the day, leading to symptoms such as difficulty swallowing and tongue protrusion, which concerned her friends.   Her breathing is doing well. Occasional cough, which is minimally productive and at her baseline. No wheezing, chest tightness, fevers, hemoptysis, anorexia, weight loss. She uses her rescue inhaler about twice a week, a reduction from previous daily use. She has reduced her smoking to a pack a month, motivated by a promise to her mother.  Inspire Download Current level 10 (2.3 V), range 1.4-2.4 V 63% usage >4 hr Average use 5 hr 40 min   Allergies  Allergen Reactions   Benadryl  [Diphenhydramine  Hcl] Shortness Of Breath and Swelling    Tongue swelling   Codeine Other (See Comments)    Alters mood   Haldol [Haloperidol Decanoate] Anaphylaxis   Amoxicillin Hives and Swelling    Swelling to extremities Did it involve swelling of the face/tongue/throat, SOB, or low BP? Yes Did it involve sudden or severe rash/hives, skin peeling, or any reaction on the inside of your mouth or nose? Yes Did you need to seek medical attention at a hospital or doctor's office? Yes When did it last happen?    young adult   If all above answers are NO, may proceed with cephalosporin use.   Bee Venom Swelling and Other (See Comments)    pimples from head to toe and whole body swelling    Immunization History  Administered Date(s) Administered   Influenza, Seasonal, Injecte, Preservative Fre 06/19/2023   PNEUMOCOCCAL CONJUGATE-20 04/27/2023   Pneumococcal Polysaccharide-23 07/07/2016   Tdap 12/20/2022    Past Medical History:  Diagnosis Date   Allergy    Anemia    takes iron supplement   Arthritis    knees, shoulders   Asthma    Bipolar 1 disorder (HCC)    Blood transfusion without reported diagnosis    Cataract, immature    CHF  (congestive heart failure) (HCC)    Clotting disorder    she denied any specific genetic bleeding/clotting diagnosis (10/05/23)   COPD (chronic obstructive pulmonary disease) (HCC)    Coronary artery disease    triple vessel   Depression    Dyspnea    Family history of adverse reaction to anesthesia    states father did not do well with anesthesia; died last time he had surgery; also had heart disease   Gallstones    GERD (gastroesophageal reflux disease)    occasional - no current med.   Glaucoma  [place] 05/2015   right second toe   Heart murmur    Hepatic steatosis    High cholesterol    History of MI (myocardial infarction)    Hypertension    states under control with meds., has been on med. x 8 yr.   Myocardial infarction (HCC)    Osteoporosis    Panic attacks    Peripheral vascular disease    carotid   Renal cyst, left    Schizophrenia (HCC)    Short of breath on exertion    Sleep apnea    Inspire in future   Urinary, incontinence, stress female    Wears dentures    upper   Wears partial dentures    lower    Tobacco History: Social History   Tobacco Use  Smoking Status Some Days   Current packs/day: 2.00   Average packs/day: 2.0 packs/day for 38.9 years (77.9 ttl pk-yrs)   Types: Cigarettes   Start date: 08/21/1985   Passive exposure: Current  Smokeless Tobacco Never  Tobacco Comments   10/23/2023 patient smoke a pack of cigarettes a month   1 cigarette when she feels like it  07/29/2024 hfb RN   Ready to quit: Not Answered Counseling given: Not Answered Tobacco comments: 10/23/2023 patient smoke a pack of cigarettes a month 1 cigarette when she feels like it  07/29/2024 hfb RN   Outpatient Medications Prior to Visit  Medication Sig Dispense Refill   albuterol  (PROVENTIL  HFA;VENTOLIN  HFA) 108 (90 BASE) MCG/ACT inhaler Inhale 2 puffs into the lungs every 6 (six) hours as needed for wheezing or shortness of breath.      Alirocumab  (PRALUENT ) 150  MG/ML SOAJ Inject 1 mL (150 mg total) into the skin every 14 (fourteen) days. 6 mL 6   amLODipine  (NORVASC ) 5 MG tablet Take 1 tablet (5 mg total) by mouth daily. 90 tablet 3   aspirin  EC 81 MG tablet Take 1 tablet (81 mg total) by mouth daily. Swallow whole.     atorvastatin  (LIPITOR ) 80 MG tablet TAKE 1 TABLET BY MOUTH EVERY DAY 90 tablet 2   brimonidine  (ALPHAGAN ) 0.15 % ophthalmic solution Place 1 drop into both eyes 2 (two) times daily.      Budeson-Glycopyrrol-Formoterol  (BREZTRI  AEROSPHERE) 160-9-4.8 MCG/ACT AERO Inhale 2 puffs into the lungs in the morning and at bedtime. 10.7 each 5   clopidogrel  (PLAVIX ) 75 MG tablet Take 1 tablet (75 mg total) by mouth daily. 90 tablet 3   escitalopram  (LEXAPRO ) 20 MG tablet Take 20 mg by mouth daily.     ferrous sulfate  325 (65 FE) MG EC tablet Take 325 mg by mouth daily with breakfast.     fluticasone  (FLONASE ) 50 MCG/ACT nasal spray Place 2 sprays into both nostrils daily. 16 g 6   isosorbide  mononitrate (IMDUR ) 120 MG 24 hr tablet Take 1 tablet (120 mg total) by mouth daily. 90 tablet 1   lamoTRIgine  (LAMICTAL ) 100 MG tablet Take 100 mg by mouth daily.     metoprolol  tartrate (LOPRESSOR ) 25 MG tablet Take 0.5 tablets (12.5 mg total) by mouth 2 (two) times daily. 90 tablet 3   niacin  (NIASPAN ) 1000 MG CR tablet TAKE 1 TABLET (1,000 MG TOTAL) BY MOUTH AT BEDTIME. 90 tablet 3   nitroGLYCERIN  (NITROSTAT ) 0.4 MG SL tablet Place 1 tablet (0.4 mg total) under the tongue every 5 (five) minutes as needed for chest pain. 25 tablet 11   ondansetron  (ZOFRAN -ODT) 4 MG disintegrating tablet Take 1 tablet (4 mg total)  by mouth every 8 (eight) hours as needed for nausea or vomiting. 20 tablet 0   pantoprazole  (PROTONIX ) 40 MG tablet TAKE 1 TABLET BY MOUTH TWICE A DAY 180 tablet 1   QUEtiapine  Fumarate 150 MG TABS Take 150 mg by mouth at bedtime.     ranolazine  (RANEXA ) 1000 MG SR tablet Take 1 tablet (1,000 mg total) by mouth 2 (two) times daily. 180 tablet 1    sucralfate  (CARAFATE ) 1 g tablet Take 1 tablet (1 g total) by mouth 4 (four) times daily -  with meals and at bedtime. Carafate  1 g 4 times daily x 2 weeks and  then twice daily for 1 month. Then may stop. 120 tablet 2   VASCEPA  1 g capsule TAKE 2 CAPSULES BY MOUTH 2 TIMES DAILY. 360 capsule 1   Vitamin D , Ergocalciferol , (DRISDOL ) 1.25 MG (50000 UNIT) CAPS capsule Take 1 capsule (50,000 Units total) by mouth every 7 (seven) days. 4 capsule 11   VITAMIN E PO Take 1 tablet by mouth daily.     traMADol  (ULTRAM ) 50 MG tablet Take 1 tablet (50 mg total) by mouth every 6 (six) hours as needed. 15 tablet 0   Facility-Administered Medications Prior to Visit  Medication Dose Route Frequency Provider Last Rate Last Admin   0.9 %  sodium chloride  infusion  500 mL Intravenous Once Armbruster, Elspeth SQUIBB, MD         Review of Systems: as above    Physical Exam:  BP 122/66 (BP Location: Right Arm, Patient Position: Sitting, Cuff Size: Normal)   Pulse 65   Ht 5' 5 (1.651 m)   Wt 182 lb 12.8 oz (82.9 kg)   LMP 11/16/2011   SpO2 98% Comment: RA  BMI 30.42 kg/m   GEN: Pleasant, interactive, well-appearing; obese; in no acute distress. HEENT:  Normocephalic and atraumatic. PERRLA. Sclera white. Nasal turbinates pink, moist and patent bilaterally. No rhinorrhea present. Oropharynx pink and moist, without exudate or edema. No lesions, ulcerations, or postnasal drip. Mallampati III.  Tongue protrusion strong with slight deviation to the left, normal anatomical variant. NECK:  Supple w/ fair ROM. No lymphadenopathy.   CV: RRR, no m/r/g PULMONARY:  Unlabored, regular breathing. Clear bilaterally A&P w/o wheezes/rales/rhonchi. No accessory muscle use.  GI: BS present and normoactive. Soft, non-tender to palpation.  MSK: No erythema, warmth or tenderness. Cap refil <2 sec all extrem.  Neuro: A/Ox3. No focal deficits noted.   Skin: Warm, no lesions or rashes.   Psych: Normal affect and behavior. Judgement  and thought content appropriate.     Lab Results:  CBC    Component Value Date/Time   WBC 9.5 07/02/2024 1503   WBC 7.7 05/26/2024 1519   RBC 4.28 07/02/2024 1503   HGB 10.9 (L) 07/02/2024 1503   HCT 34.3 (L) 07/02/2024 1503   PLT 261 07/02/2024 1503   MCV 80.1 07/02/2024 1503   MCH 25.5 (L) 07/02/2024 1503   MCHC 31.8 07/02/2024 1503   RDW 22.6 (H) 07/02/2024 1503   LYMPHSABS 2.7 07/02/2024 1503   MONOABS 0.7 07/02/2024 1503   EOSABS 0.2 07/02/2024 1503   BASOSABS 0.1 07/02/2024 1503    BMET    Component Value Date/Time   NA 138 07/02/2024 1503   NA 143 03/27/2024 1541   K 4.0 07/02/2024 1503   CL 106 07/02/2024 1503   CO2 25 07/02/2024 1503   GLUCOSE 105 (H) 07/02/2024 1503   BUN 11 07/02/2024 1503   BUN 9 03/27/2024 1541  CREATININE 0.94 07/02/2024 1503   CREATININE 1.10 (H) 02/11/2016 0921   CALCIUM  9.1 07/02/2024 1503   GFRNONAA >60 07/02/2024 1503   GFRAA >60 03/07/2020 0313    BNP No results found for: BNP   Imaging:  No results found.   acetaminophen  (TYLENOL ) tablet 650 mg     Date Action Dose Route User   07/15/2024 1301 Given 650 mg Oral Hudson, Melissa A, RN      acetaminophen  (TYLENOL ) tablet 650 mg     Date Action Dose Route User   07/22/2024 1300 Given 650 mg Oral Ranabhat, Sunita, RN      ferumoxytol  (FERAHEME ) 510 mg in sodium chloride  0.9 % 100 mL IVPB     Date Action Dose Route User   07/15/2024 1333 New Bag/Given 510 mg Intravenous Hudson, Melissa A, RN      ferumoxytol  (FERAHEME ) 510 mg in sodium chloride  0.9 % 100 mL IVPB     Date Action Dose Route User   07/22/2024 1333 New Bag/Given 510 mg Intravenous Ranabhat, Sunita, RN      loratadine  (CLARITIN ) tablet 10 mg     Date Action Dose Route User   07/15/2024 1301 Given 10 mg Oral Hudson, Melissa A, RN      loratadine  (CLARITIN ) tablet 10 mg     Date Action Dose Route User   07/22/2024 1300 Given 10 mg Oral Ranabhat, Sunita, RN          Latest Ref Rng  & Units 06/11/2023    1:06 PM  PFT Results  FVC-Pre L 2.65   FVC-Predicted Pre % 87   FVC-Post L 2.73   FVC-Predicted Post % 89   Pre FEV1/FVC % % 63   Post FEV1/FCV % % 69   FEV1-Pre L 1.67   FEV1-Predicted Pre % 71   FEV1-Post L 1.88   DLCO uncorrected ml/min/mmHg 18.42   DLCO UNC% % 97   DLCO corrected ml/min/mmHg 18.42   DLCO COR %Predicted % 97   DLVA Predicted % 110   TLC L 4.87   TLC % Predicted % 102   RV % Predicted % 109     No results found for: NITRICOXIDE      Assessment & Plan:   OSA (obstructive sleep apnea) Severe OSA, intolerant of CPAP.  Inspire implanted 10/10/2023.  Delayed activation due to ongoing nerve healing; activation date 01/04/2024.  Tolerating well. She is receiving benefit from use. Residual fatigue is multifactorial. Prior decreased hours of use and failure to step up due to life stressors/family illness. Improved usage over the last few weeks. Encouraged to continue utilizing nightly with minimum goal of 4 hr.  Tongue motion today was very strong. She is having some sore throat in the mornings. Stepped back down to level 7 with comfortable tongue protrusion. She was having subjective benefit on this levels previously. Advised to remain here until her sleep study. Verbalized understanding. Encouraged to continue management of bipolar as per psychiatry. Understands remote use.  Understands to utilize nightly or anytime that she is napping.  Aware of risks of untreated sleep apnea.  Safe driving practices reviewed.   Patient Instructions  Stay on level 7 until your slep study. Use your Inspire every night, minimum of 4 hours but goal of 6 hours or more.    Attend your titration overnight study on 1/5  Continue Albuterol  inhaler 2 puffs every 6 hours as needed for shortness of breath or wheezing. Notify if symptoms persist despite rescue inhaler/neb use.  Continue Breztri  2 puffs Twice daily. Brush tongue and rinse mouth afterwards  Referral to  lung cancer screening - due June 2026    Follow up 1/20 at 130 with Dr. Neda as previously scheduled. If symptoms do not improve or worsen, please contact office for sooner follow up      COPD (chronic obstructive pulmonary disease) (HCC) Stable without any recent exacerbations.  Continue triple therapy regimen.  Action plan in place.      I spent 35 minutes of dedicated to the care of this patient on the date of this encounter to include pre-visit review of records, face-to-face time with the patient discussing conditions above, post visit ordering of testing, clinical documentation with the electronic health record, making appropriate referrals as documented, and communicating necessary findings to members of the patients care team.  Comer LULLA Rouleau, NP 07/29/2024  Pt aware and understands NP's role.

## 2024-07-29 NOTE — Patient Instructions (Addendum)
 Stay on level 7 until your slep study. Use your Inspire every night, minimum of 4 hours but goal of 6 hours or more.    Attend your titration overnight study on 1/5  Continue Albuterol  inhaler 2 puffs every 6 hours as needed for shortness of breath or wheezing. Notify if symptoms persist despite rescue inhaler/neb use.  Continue Breztri  2 puffs Twice daily. Brush tongue and rinse mouth afterwards  Referral to lung cancer screening - due June 2026    Follow up 1/20 at 130 with Dr. Neda as previously scheduled. If symptoms do not improve or worsen, please contact office for sooner follow up

## 2024-08-15 ENCOUNTER — Encounter: Payer: Self-pay | Admitting: Gastroenterology

## 2024-08-15 ENCOUNTER — Ambulatory Visit: Admitting: Gastroenterology

## 2024-08-15 VITALS — BP 122/78 | HR 82 | Ht 64.0 in | Wt 179.1 lb

## 2024-08-15 DIAGNOSIS — D509 Iron deficiency anemia, unspecified: Secondary | ICD-10-CM

## 2024-08-15 DIAGNOSIS — Z8711 Personal history of peptic ulcer disease: Secondary | ICD-10-CM | POA: Diagnosis not present

## 2024-08-15 DIAGNOSIS — R110 Nausea: Secondary | ICD-10-CM

## 2024-08-15 DIAGNOSIS — K219 Gastro-esophageal reflux disease without esophagitis: Secondary | ICD-10-CM

## 2024-08-15 NOTE — Progress Notes (Signed)
 "  HPI :  63 year old female here for follow-up visit for iron deficiency anemia.  Recall she has a remote history of gastric ulcers diagnosed back in May 2025 with gastritis.  Over time she has had recurrent iron deficiency anemia.  She had a repeat EGD and colonoscopy with me this past September.  3 cm hiatal hernia noted, interval healing of gastric ulcers, biopsies negative for H. pylori.  Colonoscopy done at the same time which was normal, no cause for iron deficiency noted.  She subsequently had a capsule endoscopy in October which was also normal without concerning pathology.  She reminds me she takes a daily aspirin  as well as Plavix  chronically.  She does not see any obvious blood in her stool.  She states her stools are sometimes dark.  She does not take any iron pills.  She has received IV iron and followed by hematology.  She does endorse chronically having a poor appetite and has nausea periodically.  No vomiting.  She takes Zofran  as needed which works well.  She thinks she has lost about 30 pounds over the past year due to her poor appetite.  She has had 3 CT scans of her abdomen pelvis this year between May and June which have not shown any concerning pathology to cause low iron levels or weight loss.  She has also had CT angio back in June showing no acute chest findings or problems there to cause weight loss.    She otherwise denies any other NSAID use.  She takes Protonix  40 mg once daily for reflux and history of ulcers.  She states she does not have any significant reflux or problems that bother her while she is taking this.  She does take chronic Lamictal  which can be associated with chronic nausea as well as other psychogenic medications.  She is speaking with her psychiatrist about coming off of Lamictal .  She denies any new medication changes that she thinks to be associated with her appetite change.   02/18/2024 abdominal pelvic CT with contrast: No acute abnormality.  Prior  cholecystectomy.  Normal liver and pancreas.  Normal intestines.   01/07/2024 EGD: 3 cm hiatal hernia, otherwise normal esophagus.  3 nonbleeding gastric ulcers (largest 10 mm).  Moderate erosive gastritis.  Moderate duodenitis.  Increase PPI pantoprazole  to 40 Mg twice daily and added Carafate  1 g 4 times daily.  Repeat EGD in 4 months (04/2024) to verify healing.  Biopsies negative for H. pylori and celiac.   04/2021 colonoscopy : 7 polyps tubular adenoma removed (2 mm to 6 mm).  Small internal hemorrhoids.  Single small angiodysplastic lesion in the transverse colon.  Good prep.  3-year repeat (due 04/2024).  EGD 05/06/24: - Esophagogastric landmarks were identified: the Z-line was found at 35 cm, the gastroesophageal junction was found at 35 cm and the upper extent of the gastric folds was found at 38 cm from the incisors. Findings: - A 3 cm hiatal hernia was present. - The Z-line was slightly irregular and was found 35 cm from the incisors. It did not meet criteria for Barrett's biopsies. - The exam of the esophagus was otherwise normal. - The entire examined stomach was normal. Interval healing of gastric ulcers. Biopsies were taken with a cold forceps for Helicobacter pylori testing. - The examined duodenum was normal.   Colonoscopy 05/06/24: - The perianal and digital rectal examinations were normal. - The terminal ileum appeared normal. - A few small-mouthed diverticula were found in the sigmoid  colon. - The colon (entire examined portion) was tortuous. - Internal hemorrhoids were found during retroflexion. - The exam was otherwise without abnormality.  FINAL DIAGNOSIS        1. Surgical [P], gastric :       - GASTRIC ANTRAL AND OXYNTIC MUCOSA WITH NO SPECIFIC HISTOPATHOLOGIC CHANGES       - HELICOBACTER PYLORI-LIKE ORGANISMS ARE NOT IDENTIFIED ON ROUTINE H&E STAIN    Capsule endoscopy performed on October 2025 and interpreted.  It is a normal exam, no clear cause for iron deficiency.     Referred for IV iron      Latest Ref Rng & Units 07/02/2024    3:03 PM 05/26/2024    3:19 PM 02/27/2024    3:51 PM  CBC  WBC 4.0 - 10.5 K/uL 9.5  7.7  8.3   Hemoglobin 12.0 - 15.0 g/dL 89.0  89.3  89.9   Hematocrit 36.0 - 46.0 % 34.3  33.5  31.1   Platelets 150 - 400 K/uL 261  232.0  320.0    Lab Results  Component Value Date   IRON 16 (L) 07/02/2024   TIBC 497 (H) 07/02/2024   FERRITIN 17 07/02/2024      Past Medical History:  Diagnosis Date   Allergy    Anemia    takes iron supplement   Arthritis    knees, shoulders   Asthma    Bipolar 1 disorder (HCC)    Blood transfusion without reported diagnosis    Cataract, immature    CHF (congestive heart failure) (HCC)    Clotting disorder    she denied any specific genetic bleeding/clotting diagnosis (10/05/23)   COPD (chronic obstructive pulmonary disease) (HCC)    Coronary artery disease    triple vessel   Depression    Dyspnea    Family history of adverse reaction to anesthesia    states father did not do well with anesthesia; died last time he had surgery; also had heart disease   Gallstones    GERD (gastroesophageal reflux disease)    occasional - no current med.   Glaucoma    Hammertoe 05/2015   right second toe   Heart murmur    Hepatic steatosis    High cholesterol    History of MI (myocardial infarction)    Hypertension    states under control with meds., has been on med. x 8 yr.   Myocardial infarction (HCC)    Osteoporosis    Panic attacks    Peripheral vascular disease    carotid   Renal cyst, left    Schizophrenia (HCC)    Short of breath on exertion    Sleep apnea    Inspire in future   Urinary, incontinence, stress female    Wears dentures    upper   Wears partial dentures    lower     Past Surgical History:  Procedure Laterality Date   C-SECTIONS X 2  1985, 1988   CARDIAC CATHETERIZATION  12/10/2006; 12/18/2006; 07/10/2007; 07/19/2009   CARDIAC CATHETERIZATION  03/04/2010    patent grafts, normal LV function, diffusely diseased RCA (Dr. DOROTHA Lesches)   CARDIAC CATHETERIZATION N/A 02/15/2016   Procedure: Left Heart Cath and Cors/Grafts Angiography;  Surgeon: Peter M Jordan, MD;  Location: [facility] INVASIVE CV LAB;  Service: Cardiovascular;  Laterality: N/A;   CHOLECYSTECTOMY N/A 06/29/2023   Procedure: LAPAROSCOPIC CHOLECYSTECTOMY WITH ICG DYE;  Surgeon: Tanda Locus, MD;  Location: [facility] ORS;  Service: General;  Laterality: N/A;  CLUB FOOT RELEASE Bilateral    1st and 2nd toes   COLONOSCOPY     CORONARY ARTERY BYPASS GRAFT  07/16/2007   LIMA to LAD, VG to ramus, VG to PDA    CORONARY STENT INTERVENTION N/A 06/23/2019   Procedure: CORONARY STENT INTERVENTION;  Surgeon: Court Dorn PARAS, MD;  Location: [facility] INVASIVE CV LAB;  Service: Cardiovascular;  Laterality: N/A;   DRUG INDUCED ENDOSCOPY N/A 05/17/2023   Procedure: DRUG INDUCED ENDOSCOPY;  Surgeon: Jude Harden GAILS, MD;  Location: [facility] ENDOSCOPY;  Service: Pulmonary;  Laterality: N/A;   ESOPHAGOGASTRODUODENOSCOPY N/A 01/07/2024   Procedure: EGD (ESOPHAGOGASTRODUODENOSCOPY);  Surgeon: Wilhelmenia Aloha Raddle., MD;  Location: [facility];  Service: Gastroenterology;  Laterality: N/A;   FLEXIBLE BRONCHOSCOPY  07/16/2007   HAMMERTOE RECONSTRUCTION WITH WEIL OSTEOTOMY Right 06/03/2015   Procedure: RIGHT SECOND HAMMERTOE CORRECTION WITH MT WEIL OSTEOTOMY;  Surgeon: Norleen Armor, MD;  Location: [facility];  Service: Orthopedics;  Laterality: Right;   HAND SURGERY Bilateral    1st and 2nd fingers - release of clubbing   IMPLANTATION OF HYPOGLOSSAL NERVE STIMULATOR N/A 10/10/2023   Procedure: IMPLANTATION OF HYPOGLOSSAL NERVE STIMULATOR;  Surgeon: Okey Burns, MD;  Location: [facility] OR;  Service: ENT;  Laterality: N/A;  right   KNEE ARTHROSCOPY WITH ANTERIOR CRUCIATE LIGAMENT (ACL) REPAIR Right 01/30/2008   LEFT HEART CATH AND CORS/GRAFTS ANGIOGRAPHY N/A 06/23/2019   Procedure: LEFT HEART CATH AND CORS/GRAFTS ANGIOGRAPHY;   Surgeon: Court Dorn PARAS, MD;  Location: [facility] INVASIVE CV LAB;  Service: Cardiovascular;  Laterality: N/A;   LEFT HEART CATH AND CORS/GRAFTS ANGIOGRAPHY N/A 03/01/2020   Procedure: LEFT HEART CATH AND CORS/GRAFTS ANGIOGRAPHY;  Surgeon: Claudene Victory ORN, MD;  Location: [facility] INVASIVE CV LAB;  Service: Cardiovascular;  Laterality: N/A;   NOVASURE ABLATION  12/19/2007   with removal of IUD   ORIF FOOT FRACTURE Left    TOE SURGERIES TO REMOVE NAILS DUE TO CLUBBING     TOTAL KNEE ARTHROPLASTY Right 10/21/2012   Procedure: TOTAL KNEE ARTHROPLASTY;  Surgeon: Donnice JONETTA Car, MD;  Location: [facility];  Service: Orthopedics;  Laterality: Right;   UPPER GASTROINTESTINAL ENDOSCOPY     Family History  Problem Relation Age of Onset   Lung cancer Mother    Kidney cancer Mother    Heart disease Father    Anesthesia problems Father        states did not do well with anesthesia   Colon polyps Sister    Breast cancer Maternal Aunt    Breast cancer Maternal Aunt    Stomach cancer Neg Hx    Esophageal cancer Neg Hx    Pancreatic cancer Neg Hx    Rectal cancer Neg Hx    Colon cancer Neg Hx    Social History[1] Current Outpatient Medications  Medication Sig Dispense Refill   albuterol  (PROVENTIL  HFA;VENTOLIN  HFA) 108 (90 BASE) MCG/ACT inhaler Inhale 2 puffs into the lungs every 6 (six) hours as needed for wheezing or shortness of breath.      Alirocumab  (PRALUENT ) 150 MG/ML SOAJ Inject 1 mL (150 mg total) into the skin every 14 (fourteen) days. 6 mL 6   amLODipine  (NORVASC ) 5 MG tablet Take 1 tablet (5 mg total) by mouth daily. 90 tablet 3   aspirin  EC 81 MG tablet Take 1 tablet (81 mg total) by mouth daily. Swallow whole.     atorvastatin  (LIPITOR ) 80 MG tablet TAKE 1 TABLET BY MOUTH EVERY DAY 90 tablet 2   brimonidine  (ALPHAGAN ) 0.15 %  ophthalmic solution Place 1 drop into both eyes 2 (two) times daily.      clopidogrel  (PLAVIX ) 75 MG tablet Take 1 tablet (75 mg total) by mouth daily. 90 tablet 3    escitalopram  (LEXAPRO ) 20 MG tablet Take 20 mg by mouth daily.     ferrous sulfate  325 (65 FE) MG EC tablet Take 325 mg by mouth daily with breakfast.     fluticasone  (FLONASE ) 50 MCG/ACT nasal spray Place 2 sprays into both nostrils daily. (Patient taking differently: Place 2 sprays into both nostrils daily as needed.) 16 g 6   isosorbide  mononitrate (IMDUR ) 120 MG 24 hr tablet Take 1 tablet (120 mg total) by mouth daily. 90 tablet 1   lamoTRIgine  (LAMICTAL ) 100 MG tablet Take 100 mg by mouth daily.     metoprolol  tartrate (LOPRESSOR ) 25 MG tablet Take 0.5 tablets (12.5 mg total) by mouth 2 (two) times daily. 90 tablet 3   niacin  (NIASPAN ) 1000 MG CR tablet TAKE 1 TABLET (1,000 MG TOTAL) BY MOUTH AT BEDTIME. 90 tablet 3   nitroGLYCERIN  (NITROSTAT ) 0.4 MG SL tablet Place 1 tablet (0.4 mg total) under the tongue every 5 (five) minutes as needed for chest pain. 25 tablet 11   ondansetron  (ZOFRAN -ODT) 4 MG disintegrating tablet Take 1 tablet (4 mg total) by mouth every 8 (eight) hours as needed for nausea or vomiting. 20 tablet 0   pantoprazole  (PROTONIX ) 40 MG tablet TAKE 1 TABLET BY MOUTH TWICE A DAY 180 tablet 1   QUEtiapine  Fumarate 150 MG TABS Take 150 mg by mouth at bedtime.     ranolazine  (RANEXA ) 1000 MG SR tablet Take 1 tablet (1,000 mg total) by mouth 2 (two) times daily. 180 tablet 1   sucralfate  (CARAFATE ) 1 g tablet Take 1 tablet (1 g total) by mouth 4 (four) times daily -  with meals and at bedtime. Carafate  1 g 4 times daily x 2 weeks and  then twice daily for 1 month. Then may stop. 120 tablet 2   VASCEPA  1 g capsule TAKE 2 CAPSULES BY MOUTH 2 TIMES DAILY. 360 capsule 1   Vitamin D , Ergocalciferol , (DRISDOL ) 1.25 MG (50000 UNIT) CAPS capsule Take 1 capsule (50,000 Units total) by mouth every 7 (seven) days. 4 capsule 11   VITAMIN E PO Take 1 tablet by mouth daily.     Budeson-Glycopyrrol-Formoterol  (BREZTRI  AEROSPHERE) 160-9-4.8 MCG/ACT AERO Inhale 2 puffs into the lungs in the morning  and at bedtime. (Patient not taking: Reported on 08/15/2024) 10.7 each 5   Current Facility-Administered Medications  Medication Dose Route Frequency Provider Last Rate Last Admin   0.9 %  sodium chloride  infusion  500 mL Intravenous Once Cheikh Bramble, Elspeth SQUIBB, MD       Allergies[2]   Review of Systems: All systems reviewed and negative except where noted in HPI.   Physical Exam: BP 122/78   Pulse 82   Ht 5' 4 (1.626 m)   Wt 179 lb 2 oz (81.3 kg)   LMP 11/16/2011   BMI 30.75 kg/m  Constitutional: Pleasant,well-developed, female in no acute distress. Neurological: Alert and oriented to person place and time. Psychiatric: Normal mood and affect. Behavior is normal.   ASSESSMENT: 63 y.o. female here for assessment of the following  1. Iron deficiency anemia, unspecified iron deficiency anemia type   2. Gastroesophageal reflux disease, unspecified whether esophagitis present   3. History of gastric ulcer   4. Nausea without vomiting    History of gastric ulcer earlier this  year that has intervally healed on follow-up endoscopy.  She is on chronic PPI with good control of reflux symptoms, no recurrent ulcers.  Her EGD and colonoscopy failed to show a clear cause for her iron deficiency.  She subsequently had a capsule endoscopy which was likewise normal.  She is having occult obscure iron loss in the setting of aspirin  and Plavix .  Suspect this is more than likely small bowel in etiology given her workup to date.  She is on IV iron, has follow-up labs with hematology next month to assess response.  She endorses significant GI side effects from oral iron in the past.  Gave her some samples of Accrufer today as this is much better tolerated and see if she can tolerate this formulation of oral iron without side effects.  If so could hopefully eliminate need for IV iron.  Hopefully she responds to IV iron and her iron stores can improve.  If she for some reason fails to respond to iron,  progressive anemia or needing transfusion, consider repeat EGD with enteroscopy and/or repeat capsule study.  Reassured her that she has had 3 CT scans this past year of her abdomen pelvis showing no concerning bowel pathology, colonoscopy and EGD showed no concerning pathology, I do not believe we are missing any concerning malignancy at this point.  CT chest otherwise shows no cause for her weight loss or poor appetite.  We discussed that Lamictal  can be associated with significant amount of nausea and GI upset.  She wants to discuss with her psychiatrist about coming off of this and I think that is reasonable.  Could consider Remeron to help stimulate her appetite, but that could interact with her other psychogenic medicines, psychiatry would have to weigh in on making any changes with that.  Otherwise continue Zofran  as needed for nausea and her daily Protonix .  Avoid all NSAIDs.  PLAN: - continue IV iron, seeing hematology for blood work next month - samples of Accrufer to see if tolerated, can continue it if so - reassuring workup to date - high risk for stopping Plavix  from cardiovascular standpoint, will continue with aspirin  and monitor response. If worsening anemia consider repeating testing as outlined - wonder if lamictal  is causing nausea, she will speak with therapist about stopping - consider Remeron if appetite issues persist, would need psych to weigh in in regards to her regimen - continue Zofran  - continue protonix   Marcey Naval, MD [facility]     [1]  Social History Tobacco Use   Smoking status: Some Days    Current packs/day: 2.00    Average packs/day: 2.0 packs/day for 39.0 years (78.0 ttl pk-yrs)    Types: Cigarettes    Start date: 08/21/1985    Passive exposure: Current   Smokeless tobacco: Never   Tobacco comments:    10/23/2023 patient smoke a pack of cigarettes a month    1 cigarette when she feels like it  07/29/2024 hfb RN  Vaping Use    Vaping status: Never Used  Substance Use Topics   Alcohol use: No   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 Yes Did it involve sudden or severe rash/hives, skin peeling, or any reaction on the inside of your mouth or nose? Yes Did you need to seek medical attention at a hospital or doctor's office? Yes When did it last happen?    young adult   If all above answers are NO, may proceed with cephalosporin use.   Bee Venom Swelling and Other (See Comments)    pimples from head to toe and whole body swelling   "

## 2024-08-15 NOTE — Patient Instructions (Addendum)
 _______________________________________________________  If your blood pressure at your visit was 140/90 or greater, please contact your primary care physician to follow up on this.  _______________________________________________________  If you are age 63 or older, your body mass index should be between 23-30. Your Body mass index is 30.75 kg/m. If this is out of the aforementioned range listed, please consider follow up with your Primary Care Provider.  If you are age 72 or younger, your body mass index should be between 19-25. Your Body mass index is 30.75 kg/m. If this is out of the aformentioned range listed, please consider follow up with your Primary Care Provider.   ________________________________________________________  The [facility] providers would like to encourage you to use MYCHART to communicate with providers for non-urgent requests or questions.  Due to long hold times on the telephone, sending your provider a message by [facility] may be a faster and more efficient way to get a response.  Please allow 48 business hours for a response.  Please remember that this is for non-urgent requests.  _______________________________________________________  [facility] is using a team-based approach to care.  Your team is made up of your doctor and two to three APPS. Our APPS (Nurse Practitioners and Physician Assistants) work with your physician to ensure care continuity for you. They are fully qualified to address your health concerns and develop a treatment plan. They communicate directly with your gastroenterologist to care for you. Seeing the Advanced Practice Practitioners on your physician's team can help you by facilitating care more promptly, often allowing for earlier appointments, access to diagnostic testing, procedures, and other specialty referrals.   We have given you samples of the following medication to take: Accrufer Lot [id number] Exp date 11-2028  Continue with  your Protonix  and zofran   Please talk to the prescriber of your lamictal  about stopping  It was a pleasure to see you today!  Thank you for trusting me with your gastrointestinal care!

## 2024-08-25 ENCOUNTER — Ambulatory Visit (HOSPITAL_BASED_OUTPATIENT_CLINIC_OR_DEPARTMENT_OTHER): Attending: Nurse Practitioner | Admitting: Pulmonary Disease

## 2024-08-25 DIAGNOSIS — G4733 Obstructive sleep apnea (adult) (pediatric): Secondary | ICD-10-CM | POA: Insufficient documentation

## 2024-08-26 DIAGNOSIS — G4733 Obstructive sleep apnea (adult) (pediatric): Secondary | ICD-10-CM | POA: Diagnosis not present

## 2024-08-26 NOTE — Procedures (Signed)
 [facility] [address] [place], [place] [address] Tel: [phone]   Fax: [phone]  Inspire Interpretation  Patient Name:  [name], [name] Date:  08/25/2024 Referring Physician:  KATHERINE COBB [phone]) %%startinterp%% Indications for Polysomnography The patient is a 64 year old Female who is 5' 4 and weighs 174.0 lbs. Her BMI equals 30.1.  A full night inspire treatment study was performed.  Medications taken at 2130.  NIACIN   METOPROLOL   SEROQUEL   CARAFATE    Polysomnogram Data A full night polysomnogram recorded the standard physiologic parameters including EEG, EOG, EMG, EKG, nasal and oral airflow.  Respiratory parameters of chest and abdominal movements were recorded with Respiratory Inductance Plethysmography belts.  Oxygen saturation was recorded by pulse oximetry.   Sleep Architecture The total recording time of the polysomnogram was 440.1 minutes.  The total sleep time was 371.0 minutes.  The patient spent 4.9% of total sleep time in Stage N1, 88.5% in Stage N2, 0.1% in Stages N3, and 6.5% in REM.  Sleep latency was 43.5 minutes.  REM latency was 188.5 minutes.  Sleep Efficiency was 84.3%.  Wake after Sleep Onset time was 26.0 minutes.  Inspire Summary The patient was on inspire therapy at levels ranging from 1.4* volts up to 2.2* volts.  The last level used in the study was 2* volts.  Respiratory Events The polysomnogram revealed a presence of 3 obstructive, 7 central, and 1 mixed apnea resulting in an Apnea index of 1.8 events per hour.  There were 415 hypopneas (>=3% desaturation and/or arousal) resulting in an Apnea\Hypopnea Index (AHI >=3% desaturation and/or arousal) of 68.9 events per hour.  There were 177 hypopneas (>=4% desaturation) resulting in an Apnea\Hypopnea Index (AHI >=4% desaturation) of 30.4 events per hour.  There were 8 Respiratory Effort Related Arousals resulting in a RERA index of 1.3 events per hour.  The Respiratory Disturbance Index is 70.2 events per hour.  The snore index was - events per hour.  Mean oxygen saturation was 92.3%.  The lowest oxygen saturation during sleep was 87.0%.  Time spent <=88% oxygen saturation was 1.9 minutes (0.4%).  Limb Activity There were - limb movements recorded.  Of this total, - were classified as PLMs.  Of the PLMs, - were associated with arousals.  The Limb Movement index was - per hour while the PLM index was - per hour.  Cardiac Summary The average pulse rate was 47.6 bpm.  The minimum pulse rate was 43.0 bpm while the maximum pulse rate was 58.0 bpm.  Cardiac rhythm was normal/abnormal.  Comments: TITRATED FROM 1.4-2.2 v  Diagnosis: Severe OSA s/p inspire implant  Recommendations: No therapeutic amplitude was found Lowest AHI was on 1.9 V with significant residual AHI Consider alternative electrode configuration   This study was personally reviewed and electronically signed by: DR. HARDEN STAFF Accredited Board Certified in Sleep Medicine  08/27/23

## 2024-08-27 ENCOUNTER — Ambulatory Visit: Payer: Self-pay | Admitting: Nurse Practitioner

## 2024-08-27 NOTE — Progress Notes (Signed)
 Seeing you 1/20 to establish care. I've been following her. Looks like she'll need an electrode change. Surprising as she has had subjective benefit. Thanks!

## 2024-08-29 ENCOUNTER — Other Ambulatory Visit: Payer: Self-pay | Admitting: Cardiovascular Disease

## 2024-09-09 ENCOUNTER — Ambulatory Visit: Admitting: Pulmonary Disease

## 2024-09-09 VITALS — BP 126/64 | HR 60 | Ht 64.0 in | Wt 175.4 lb

## 2024-09-09 DIAGNOSIS — J449 Chronic obstructive pulmonary disease, unspecified: Secondary | ICD-10-CM

## 2024-09-09 DIAGNOSIS — G4733 Obstructive sleep apnea (adult) (pediatric): Secondary | ICD-10-CM | POA: Diagnosis not present

## 2024-09-09 DIAGNOSIS — F17210 Nicotine dependence, cigarettes, uncomplicated: Secondary | ICD-10-CM

## 2024-09-09 MED ORDER — FLUTICASONE-SALMETEROL 500-50 MCG/ACT IN AEPB: 1.0000 | INHALATION_SPRAY | Freq: Two times a day (BID) | RESPIRATORY_TRACT | 3 refills | Status: AC

## 2024-09-09 NOTE — Progress Notes (Signed)
 "              [name]    [id number]    1961/05/30  Primary Care Physician:Briscoe, Luke POUR, MD  Referring Physician: Rena Luke POUR, MD [address] [place],  [place] [address]  Chief complaint:   Patient being seen for obstructive sleep apnea, chronic obstructive pulmonary disease  HPI:  COPD patient, currently on Breztri  She feels Breztri  is too strong and would like to go back to Advair  History of obstructive sleep apnea, intolerant of CPAP, has inspire device in place  Sleep study 08/25/2007 shows AHI of 20.6, CPAP pressure of 6 Home sleep study 10/14/2022 AHI of 33.2, SpO2 of 80% History of coronary artery disease, CABG, hypertension, non-ST elevation MI, bipolar disorder, severe OSA, GERD  She is getting about 5 hours of sleep every night or more Feels well overall  She recently had an inspire titration study performed that was suboptimal with recommendation to try a different electrode  She is currently on electrode A Amplitude of 2.3 V, range of 1.4-2.4  - Inspire device was implanted 10/10/2023 -Inspire device activation 01/04/2024   Outpatient Encounter Medications as of 09/09/2024  Medication Sig   albuterol  (PROVENTIL  HFA;VENTOLIN  HFA) 108 (90 BASE) MCG/ACT inhaler Inhale 2 puffs into the lungs every 6 (six) hours as needed for wheezing or shortness of breath.    Alirocumab  (PRALUENT ) 150 MG/ML SOAJ INJECT 1 ML (150 MG TOTAL) INTO THE SKIN EVERY 14 (FOURTEEN) DAYS.   aspirin  EC 81 MG tablet Take 1 tablet (81 mg total) by mouth daily. Swallow whole.   atorvastatin  (LIPITOR ) 80 MG tablet TAKE 1 TABLET BY MOUTH EVERY DAY   brimonidine  (ALPHAGAN ) 0.15 % ophthalmic solution Place 1 drop into both eyes 2 (two) times daily.    clopidogrel  (PLAVIX ) 75 MG tablet Take 1 tablet (75 mg total) by mouth daily.   escitalopram  (LEXAPRO ) 20 MG tablet Take 20 mg by mouth daily.   ferrous sulfate  325 (65 FE) MG EC tablet Take 325 mg by mouth daily with  breakfast.   fluticasone  (FLONASE ) 50 MCG/ACT nasal spray Place 2 sprays into both nostrils daily.   fluticasone -salmeterol (ADVAIR DISKUS) 500-50 MCG/ACT AEPB Inhale 1 puff into the lungs in the morning and at bedtime.   isosorbide  mononitrate (IMDUR ) 120 MG 24 hr tablet Take 1 tablet (120 mg total) by mouth daily.   metoprolol  tartrate (LOPRESSOR ) 25 MG tablet Take 0.5 tablets (12.5 mg total) by mouth 2 (two) times daily.   niacin  (NIASPAN ) 1000 MG CR tablet TAKE 1 TABLET (1,000 MG TOTAL) BY MOUTH AT BEDTIME.   nitroGLYCERIN  (NITROSTAT ) 0.4 MG SL tablet Place 1 tablet (0.4 mg total) under the tongue every 5 (five) minutes as needed for chest pain.   ondansetron  (ZOFRAN -ODT) 4 MG disintegrating tablet Take 1 tablet (4 mg total) by mouth every 8 (eight) hours as needed for nausea or vomiting.   pantoprazole  (PROTONIX ) 40 MG tablet TAKE 1 TABLET BY MOUTH TWICE A DAY   QUEtiapine  Fumarate 150 MG TABS Take 150 mg by mouth at bedtime.   ranolazine  (RANEXA ) 1000 MG SR tablet Take 1 tablet (1,000 mg total) by mouth 2 (two) times daily.   VASCEPA  1 g capsule TAKE 2 CAPSULES BY MOUTH 2 TIMES DAILY.   Vitamin D , Ergocalciferol , (DRISDOL ) 1.25 MG (50000 UNIT) CAPS capsule Take 1 capsule (50,000 Units total) by mouth every 7 (seven) days.   VITAMIN E PO Take 1 tablet by mouth daily.  amLODipine  (NORVASC ) 5 MG tablet Take 1 tablet (5 mg total) by mouth daily. (Patient not taking: Reported on 09/09/2024)   Ferric Maltol (ACCRUFER) 30 MG CAPS Take 1 capsule by mouth 2 (two) times daily. Sample Accrufer Lot [id number] Exp date 11-2028 (Patient not taking: Reported on 09/09/2024)   lamoTRIgine  (LAMICTAL ) 100 MG tablet Take 100 mg by mouth daily. (Patient not taking: Reported on 09/09/2024)   sucralfate  (CARAFATE ) 1 g tablet Take 1 tablet (1 g total) by mouth 4 (four) times daily -  with meals and at bedtime. Carafate  1 g 4 times daily x 2 weeks and  then twice daily for 1 month. Then may stop. (Patient not taking: Reported  on 09/09/2024)   [DISCONTINUED] Budeson-Glycopyrrol-Formoterol  (BREZTRI  AEROSPHERE) 160-9-4.8 MCG/ACT AERO Inhale 2 puffs into the lungs in the morning and at bedtime. (Patient not taking: Reported on 09/09/2024)   Facility-Administered Encounter Medications as of 09/09/2024  Medication   0.9 %  sodium chloride  infusion    Allergies as of 09/09/2024 - Review Complete 09/09/2024  Allergen Reaction Noted   Benadryl  [diphenhydramine  hcl] Shortness Of Breath and Swelling 06/03/2015   Codeine Other (See Comments) 11/18/2011   Haldol [haloperidol decanoate] Anaphylaxis 11/18/2011   Amoxicillin Hives and Swelling 11/18/2011   Bee venom Swelling and Other (See Comments) 11/16/2015    Past Medical History:  Diagnosis Date   Allergy    Anemia    takes iron supplement   Arthritis    knees, shoulders   Asthma    Bipolar 1 disorder (HCC)    Blood transfusion without reported diagnosis    Cataract, immature    CHF (congestive heart failure) (HCC)    Clotting disorder    she denied any specific genetic bleeding/clotting diagnosis (10/05/23)   COPD (chronic obstructive pulmonary disease) (HCC)    Coronary artery disease    triple vessel   Depression    Dyspnea    Family history of adverse reaction to anesthesia    states father did not do well with anesthesia; died last time he had surgery; also had heart disease   Gallstones    GERD (gastroesophageal reflux disease)    occasional - no current med.   Glaucoma    Hammertoe 05/2015   right second toe   Heart murmur    Hepatic steatosis    High cholesterol    History of MI (myocardial infarction)    Hypertension    states under control with meds., has been on med. x 8 yr.   Myocardial infarction (HCC)    Osteoporosis    Panic attacks    Peripheral vascular disease    carotid   Renal cyst, left    Schizophrenia (HCC)    Short of breath on exertion    Sleep apnea    Inspire in future   Urinary, incontinence, stress female     Wears dentures    upper   Wears partial dentures    lower    Past Surgical History:  Procedure Laterality Date   C-SECTIONS X 2  1985, 1988   CARDIAC CATHETERIZATION  12/10/2006; 12/18/2006; 07/10/2007; 07/19/2009   CARDIAC CATHETERIZATION  03/04/2010   patent grafts, normal LV function, diffusely diseased RCA (Dr. DOROTHA Lesches)   CARDIAC CATHETERIZATION N/A 02/15/2016   Procedure: Left Heart Cath and Cors/Grafts Angiography;  Surgeon: Peter M Jordan, MD;  Location: [facility] INVASIVE CV LAB;  Service: Cardiovascular;  Laterality: N/A;   CHOLECYSTECTOMY N/A 06/29/2023   Procedure: LAPAROSCOPIC CHOLECYSTECTOMY WITH ICG DYE;  Surgeon: Tanda Locus, MD;  Location: [facility] ORS;  Service: General;  Laterality: N/A;   CLUB FOOT RELEASE Bilateral    1st and 2nd toes   COLONOSCOPY     CORONARY ARTERY BYPASS GRAFT  07/16/2007   LIMA to LAD, VG to ramus, VG to PDA    CORONARY STENT INTERVENTION N/A 06/23/2019   Procedure: CORONARY STENT INTERVENTION;  Surgeon: Court Dorn PARAS, MD;  Location: [facility] INVASIVE CV LAB;  Service: Cardiovascular;  Laterality: N/A;   DRUG INDUCED ENDOSCOPY N/A 05/17/2023   Procedure: DRUG INDUCED ENDOSCOPY;  Surgeon: Jude Harden GAILS, MD;  Location: [facility] ENDOSCOPY;  Service: Pulmonary;  Laterality: N/A;   ESOPHAGOGASTRODUODENOSCOPY N/A 01/07/2024   Procedure: EGD (ESOPHAGOGASTRODUODENOSCOPY);  Surgeon: Wilhelmenia Aloha Raddle., MD;  Location: [facility];  Service: Gastroenterology;  Laterality: N/A;   FLEXIBLE BRONCHOSCOPY  07/16/2007   HAMMERTOE RECONSTRUCTION WITH WEIL OSTEOTOMY Right 06/03/2015   Procedure: RIGHT SECOND HAMMERTOE CORRECTION WITH MT WEIL OSTEOTOMY;  Surgeon: Norleen Armor, MD;  Location: [facility];  Service: Orthopedics;  Laterality: Right;   HAND SURGERY Bilateral    1st and 2nd fingers - release of clubbing   IMPLANTATION OF HYPOGLOSSAL NERVE STIMULATOR N/A 10/10/2023   Procedure: IMPLANTATION OF HYPOGLOSSAL NERVE STIMULATOR;  Surgeon: Okey Burns, MD;   Location: [facility] OR;  Service: ENT;  Laterality: N/A;  right   KNEE ARTHROSCOPY WITH ANTERIOR CRUCIATE LIGAMENT (ACL) REPAIR Right 01/30/2008   LEFT HEART CATH AND CORS/GRAFTS ANGIOGRAPHY N/A 06/23/2019   Procedure: LEFT HEART CATH AND CORS/GRAFTS ANGIOGRAPHY;  Surgeon: Court Dorn PARAS, MD;  Location: [facility] INVASIVE CV LAB;  Service: Cardiovascular;  Laterality: N/A;   LEFT HEART CATH AND CORS/GRAFTS ANGIOGRAPHY N/A 03/01/2020   Procedure: LEFT HEART CATH AND CORS/GRAFTS ANGIOGRAPHY;  Surgeon: Claudene Victory ORN, MD;  Location: [facility] INVASIVE CV LAB;  Service: Cardiovascular;  Laterality: N/A;   NOVASURE ABLATION  12/19/2007   with removal of IUD   ORIF FOOT FRACTURE Left    TOE SURGERIES TO REMOVE NAILS DUE TO CLUBBING     TOTAL KNEE ARTHROPLASTY Right 10/21/2012   Procedure: TOTAL KNEE ARTHROPLASTY;  Surgeon: Donnice JONETTA Car, MD;  Location: [facility];  Service: Orthopedics;  Laterality: Right;   UPPER GASTROINTESTINAL ENDOSCOPY      Family History  Problem Relation Age of Onset   Lung cancer Mother    Kidney cancer Mother    Heart disease Father    Anesthesia problems Father        states did not do well with anesthesia   Colon polyps Sister    Breast cancer Maternal Aunt    Breast cancer Maternal Aunt    Stomach cancer Neg Hx    Esophageal cancer Neg Hx    Pancreatic cancer Neg Hx    Rectal cancer Neg Hx    Colon cancer Neg Hx     Social History   Socioeconomic History   Marital status: Divorced    Spouse name: Not on file   Number of children: Not on file   Years of education: Not on file   Highest education level: Not on file  Occupational History   Not on file  Tobacco Use   Smoking status: Some Days    Current packs/day: 2.00    Average packs/day: 2.0 packs/day for 39.1 years (78.1 ttl pk-yrs)    Types: Cigarettes    Start date: 08/21/1985    Passive exposure: Current   Smokeless tobacco: Never   Tobacco comments:    10/23/2023  patient smoke a pack of cigarettes a month     1 cigarette when she feels like it  07/29/2024 hfb RN  Vaping Use   Vaping status: Never Used  Substance and Sexual Activity   Alcohol use: No   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   Sexual activity: Not on file  Other Topics Concern   Not on file  Social History Narrative   Not on file   Social Drivers of Health   Tobacco Use: High Risk (08/29/2024)   Received from [facility]   Patient History    Smoking Tobacco Use: Some Days    Smokeless Tobacco Use: Never    Passive Exposure: Past  Financial Resource Strain: Low Risk (08/29/2024)   Received from [facility]   Overall Financial Resource Strain (CARDIA)    How hard is it for you to pay for the very basics like food, housing, medical care, and heating?: Not hard at all  Food Insecurity: No Food Insecurity (08/29/2024)   Received from [facility]   Epic    Within the past 12 months, you worried that your food would run out before you got the money to buy more.: Never true    Within the past 12 months, the food you bought just didn't last and you didn't have money to get more.: Never true  Transportation Needs: No Transportation Needs (08/29/2024)   Received from [facility]    In the past 12 months, has lack of transportation kept you from medical appointments or from getting medications?: No    In the past 12 months, has lack of transportation kept you from meetings, work, or from getting things needed for daily living?: No  Physical Activity: Unknown (08/29/2024)   Received from [facility]   Exercise Vital Sign    Days of Exercise per Week: Not on file    On average, how many minutes do you engage in exercise at this level?: 30 min  Stress: Stress Concern Present (08/29/2024)   Received from [facility] - Occupational Stress Questionnaire    Do you feel stress - tense, restless, nervous, or anxious, or unable to sleep at night because your mind is troubled  all the time - these days?: To some extent  Social Connections: Patient Declined (08/29/2024)   Received from [facility]   Social Network    How would you rate your social network (family, work, friends)?: Patient declined  Intimate Partner Violence: Patient Declined (08/29/2024)   Received from [facility]   HITS    Over the last 12 months how often did your partner physically hurt you?: Patient declined    Over the last 12 months how often did your partner insult you or talk down to you?: Patient declined    Over the last 12 months how often did your partner threaten you with physical harm?: Patient declined    Over the last 12 months how often did your partner scream or curse at you?: Patient declined  Depression (PHQ2-9): High Risk (07/02/2024)   Depression (PHQ2-9)    PHQ-2 Score: 11  Alcohol Screen: Not on file  Housing: Unknown (08/29/2024)   Received from [facility]    In the last 12 months, was there a time when you were not able to pay the mortgage or rent on time?: No    Number of Times [facility]  in the Last Year: Not on file    At any time in the past 12 months, were you homeless or living in a shelter (including now)?: No  Utilities: Not At Risk (08/29/2024)   Received from [facility]    In the past 12 months has the electric, gas, oil, or water company threatened to shut off services in your home?: No  Health Literacy: Not on file    Review of Systems  Respiratory:  Positive for apnea and shortness of breath.   Psychiatric/Behavioral:  Positive for sleep disturbance.     Vitals:   09/09/24 1329  BP: 126/64  Pulse: 60  SpO2: 93%     Physical Exam Constitutional:      Appearance: She is obese.  HENT:     Nose: Nose normal.     Mouth/Throat:     Mouth: Mucous membranes are moist.  Eyes:     General: No scleral icterus.    Pupils: Pupils are equal, round, and reactive to light.  Cardiovascular:     Rate and Rhythm: Normal rate and regular  rhythm.     Heart sounds: No murmur heard.    No friction rub.  Pulmonary:     Effort: No respiratory distress.     Breath sounds: No stridor. No wheezing or rhonchi.  Musculoskeletal:     Cervical back: No rigidity or tenderness.  Neurological:     Mental Status: She is alert.  Psychiatric:        Mood and Affect: Mood normal.     Data Reviewed: PFT 06/11/2023 shows FVC of 87, FEV1 of 71, DLCO 97-moderate obstruction  Titration study 08/25/2024 showing suboptimal titration  Device was interrogated during the office visit today Switched over to electrode B Amplitude setting of 0.9 with a range of 0.8-1.2 Pulse width of 90, rate of 33  Assessment/Plan: Obstructive sleep apnea, status post inspire device placement Suboptimal titration - Electrodes changed today - She will gradually escalate treatment every couple of weeks We will plan a repeat home sleep study in about 8 weeks-plan a WatchPAT study  Follow-up in about 6 weeks to see how she is doing  Regarding chronic obstructive pulmonary disease - Will discontinue Breztri  - Order written for Advair  Will see her back in about 6 weeks  Importance of continuing inspire device titration, treatment for sleep disordered breathing was reiterated  Encouraged to give us  a call with any significant concerns  I personally spent a total of 42 minutes in the care of the patient today including preparing to see the patient, getting/reviewing separately obtained history, performing a medically appropriate exam/evaluation, counseling and educating, placing orders, referring and communicating with other health care professionals, documenting clinical information in the EHR, and independently interpreting results.     Jennet Epley MD [facility] 09/09/2024, 5:07 PM  CC: Rena Luke POUR, MD   "

## 2024-09-09 NOTE — Patient Instructions (Signed)
 We did change her electrodes today  Your sleep duration your pause duration, timing before the machine kicks on remains the same  You are on a level 2 every 2 weeks go up 1 level as tolerated  I will see you back in the office in 6 weeks  We will plan for a home sleep test in 8 weeks and see you after that study as well  I have taken Breztri  of your list of medications and called in Advair  Call us  with significant concerns

## 2024-09-12 ENCOUNTER — Ambulatory Visit

## 2024-09-19 ENCOUNTER — Ambulatory Visit

## 2024-10-27 ENCOUNTER — Encounter: Admitting: Adult Health

## 2024-11-04 ENCOUNTER — Encounter

## 2024-11-05 ENCOUNTER — Inpatient Hospital Stay: Admitting: Oncology

## 2024-11-05 ENCOUNTER — Inpatient Hospital Stay

## 2024-11-07 ENCOUNTER — Ambulatory Visit

## 2024-11-17 ENCOUNTER — Encounter: Admitting: Adult Health

## 2024-12-19 ENCOUNTER — Ambulatory Visit: Admitting: Pulmonary Disease
